# Patient Record
Sex: Female | Born: 1955
Health system: Southern US, Community
[De-identification: ages and names within clinical notes are randomized; demographics above are authoritative.]

## PROBLEM LIST (undated history)

## (undated) DIAGNOSIS — T7840XA Allergy, unspecified, initial encounter: Secondary | ICD-10-CM

## (undated) DIAGNOSIS — F329 Major depressive disorder, single episode, unspecified: Secondary | ICD-10-CM

## (undated) DIAGNOSIS — D72819 Decreased white blood cell count, unspecified: Secondary | ICD-10-CM

## (undated) DIAGNOSIS — K862 Cyst of pancreas: Secondary | ICD-10-CM

## (undated) DIAGNOSIS — J45909 Unspecified asthma, uncomplicated: Secondary | ICD-10-CM

## (undated) DIAGNOSIS — M1990 Unspecified osteoarthritis, unspecified site: Secondary | ICD-10-CM

## (undated) DIAGNOSIS — F419 Anxiety disorder, unspecified: Secondary | ICD-10-CM

## (undated) DIAGNOSIS — S0300XA Dislocation of jaw, unspecified side, initial encounter: Secondary | ICD-10-CM

## (undated) DIAGNOSIS — H269 Unspecified cataract: Secondary | ICD-10-CM

## (undated) DIAGNOSIS — R011 Cardiac murmur, unspecified: Secondary | ICD-10-CM

## (undated) DIAGNOSIS — I951 Orthostatic hypotension: Secondary | ICD-10-CM

## (undated) DIAGNOSIS — A6920 Lyme disease, unspecified: Secondary | ICD-10-CM

## (undated) DIAGNOSIS — Q248 Other specified congenital malformations of heart: Secondary | ICD-10-CM

## (undated) DIAGNOSIS — J309 Allergic rhinitis, unspecified: Secondary | ICD-10-CM

## (undated) DIAGNOSIS — I341 Nonrheumatic mitral (valve) prolapse: Secondary | ICD-10-CM

## (undated) DIAGNOSIS — E039 Hypothyroidism, unspecified: Secondary | ICD-10-CM

## (undated) DIAGNOSIS — M810 Age-related osteoporosis without current pathological fracture: Secondary | ICD-10-CM

## (undated) DIAGNOSIS — F32A Depression, unspecified: Secondary | ICD-10-CM

## (undated) DIAGNOSIS — I700 Atherosclerosis of aorta: Secondary | ICD-10-CM

## (undated) DIAGNOSIS — J939 Pneumothorax, unspecified: Secondary | ICD-10-CM

## (undated) DIAGNOSIS — M722 Plantar fascial fibromatosis: Secondary | ICD-10-CM

## (undated) DIAGNOSIS — D510 Vitamin B12 deficiency anemia due to intrinsic factor deficiency: Secondary | ICD-10-CM

## (undated) DIAGNOSIS — G629 Polyneuropathy, unspecified: Secondary | ICD-10-CM

## (undated) DIAGNOSIS — C4491 Basal cell carcinoma of skin, unspecified: Secondary | ICD-10-CM

## (undated) HISTORY — DX: Age-related osteoporosis without current pathological fracture: M81.0

## (undated) HISTORY — DX: Depression, unspecified: F32.A

## (undated) HISTORY — DX: Other specified congenital malformations of heart: Q24.8

## (undated) HISTORY — DX: Cardiac murmur, unspecified: R01.1

## (undated) HISTORY — DX: Unspecified cataract: H26.9

## (undated) HISTORY — DX: Unspecified asthma, uncomplicated: J45.909

## (undated) HISTORY — PX: WISDOM TOOTH EXTRACTION: SHX21

## (undated) HISTORY — DX: Basal cell carcinoma of skin, unspecified: C44.91

## (undated) HISTORY — DX: Unspecified osteoarthritis, unspecified site: M19.90

## (undated) HISTORY — DX: Anxiety disorder, unspecified: F41.9

## (undated) HISTORY — DX: Pneumothorax, unspecified: J93.9

## (undated) HISTORY — DX: Decreased white blood cell count, unspecified: D72.819

## (undated) HISTORY — DX: Vitamin B12 deficiency anemia due to intrinsic factor deficiency: D51.0

## (undated) HISTORY — DX: Plantar fascial fibromatosis: M72.2

## (undated) HISTORY — DX: Orthostatic hypotension: I95.1

## (undated) HISTORY — DX: Cyst of pancreas: K86.2

## (undated) HISTORY — DX: Allergy, unspecified, initial encounter: T78.40XA

## (undated) HISTORY — DX: Allergic rhinitis, unspecified: J30.9

## (undated) HISTORY — DX: Nonrheumatic mitral (valve) prolapse: I34.1

## (undated) HISTORY — DX: Polyneuropathy, unspecified: G62.9

## (undated) HISTORY — DX: Atherosclerosis of aorta: I70.0

## (undated) HISTORY — DX: Major depressive disorder, single episode, unspecified: F32.9

---
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
## 2008-07-18 ENCOUNTER — Emergency Department (HOSPITAL_COMMUNITY): Admission: EM | Admit: 2008-07-18 | Discharge: 2008-07-18 | Payer: Self-pay | Admitting: Family Medicine

## 2008-07-19 ENCOUNTER — Encounter: Payer: Self-pay | Admitting: Pulmonary Disease

## 2008-07-19 ENCOUNTER — Inpatient Hospital Stay (HOSPITAL_COMMUNITY): Admission: EM | Admit: 2008-07-19 | Discharge: 2008-07-21 | Payer: Self-pay | Admitting: Emergency Medicine

## 2008-07-19 ENCOUNTER — Ambulatory Visit: Payer: Self-pay | Admitting: Pulmonary Disease

## 2008-07-24 ENCOUNTER — Telehealth: Payer: Self-pay | Admitting: Pulmonary Disease

## 2008-07-24 DIAGNOSIS — R930 Abnormal findings on diagnostic imaging of skull and head, not elsewhere classified: Secondary | ICD-10-CM | POA: Insufficient documentation

## 2008-07-24 DIAGNOSIS — J189 Pneumonia, unspecified organism: Secondary | ICD-10-CM | POA: Insufficient documentation

## 2008-07-27 ENCOUNTER — Ambulatory Visit: Payer: Self-pay | Admitting: Pulmonary Disease

## 2008-07-27 ENCOUNTER — Telehealth: Payer: Self-pay | Admitting: Pulmonary Disease

## 2008-07-28 ENCOUNTER — Telehealth: Payer: Self-pay | Admitting: Pulmonary Disease

## 2008-07-28 ENCOUNTER — Telehealth (INDEPENDENT_AMBULATORY_CARE_PROVIDER_SITE_OTHER): Payer: Self-pay | Admitting: *Deleted

## 2008-08-09 ENCOUNTER — Encounter: Payer: Self-pay | Admitting: Pulmonary Disease

## 2008-08-21 ENCOUNTER — Ambulatory Visit: Payer: Self-pay | Admitting: Cardiovascular Disease

## 2008-08-21 ENCOUNTER — Ambulatory Visit: Payer: Self-pay | Admitting: Pulmonary Disease

## 2008-10-18 ENCOUNTER — Ambulatory Visit: Payer: Self-pay | Admitting: Pulmonary Disease

## 2008-10-18 DIAGNOSIS — R0602 Shortness of breath: Secondary | ICD-10-CM | POA: Insufficient documentation

## 2008-10-19 ENCOUNTER — Telehealth (INDEPENDENT_AMBULATORY_CARE_PROVIDER_SITE_OTHER): Payer: Self-pay | Admitting: *Deleted

## 2008-10-19 DIAGNOSIS — M810 Age-related osteoporosis without current pathological fracture: Secondary | ICD-10-CM

## 2008-10-20 ENCOUNTER — Telehealth: Payer: Self-pay | Admitting: Pulmonary Disease

## 2008-10-27 ENCOUNTER — Ambulatory Visit: Payer: Self-pay | Admitting: Pulmonary Disease

## 2008-10-27 ENCOUNTER — Telehealth: Payer: Self-pay | Admitting: Pulmonary Disease

## 2008-11-16 ENCOUNTER — Ambulatory Visit: Payer: Self-pay | Admitting: Pulmonary Disease

## 2008-11-16 DIAGNOSIS — J45909 Unspecified asthma, uncomplicated: Secondary | ICD-10-CM | POA: Insufficient documentation

## 2008-12-19 ENCOUNTER — Emergency Department (HOSPITAL_COMMUNITY): Admission: EM | Admit: 2008-12-19 | Discharge: 2008-12-19 | Payer: Self-pay | Admitting: Emergency Medicine

## 2009-04-09 ENCOUNTER — Telehealth: Payer: Self-pay | Admitting: Pulmonary Disease

## 2009-06-27 ENCOUNTER — Emergency Department (HOSPITAL_COMMUNITY): Admission: EM | Admit: 2009-06-27 | Discharge: 2009-06-28 | Payer: Self-pay | Admitting: Diagnostic Radiology

## 2009-12-24 ENCOUNTER — Ambulatory Visit: Payer: Self-pay | Admitting: Gynecology

## 2010-01-10 ENCOUNTER — Ambulatory Visit: Payer: Self-pay | Admitting: Gynecology

## 2010-02-01 ENCOUNTER — Emergency Department (HOSPITAL_COMMUNITY): Admission: EM | Admit: 2010-02-01 | Discharge: 2010-02-01 | Payer: Self-pay | Admitting: Emergency Medicine

## 2010-03-14 ENCOUNTER — Ambulatory Visit: Payer: Self-pay | Admitting: Gynecology

## 2010-07-26 ENCOUNTER — Emergency Department (HOSPITAL_COMMUNITY)
Admission: EM | Admit: 2010-07-26 | Discharge: 2010-07-26 | Payer: Self-pay | Source: Home / Self Care | Admitting: Family Medicine

## 2010-07-27 ENCOUNTER — Emergency Department (HOSPITAL_COMMUNITY)
Admission: EM | Admit: 2010-07-27 | Discharge: 2010-07-27 | Payer: Self-pay | Source: Home / Self Care | Admitting: Emergency Medicine

## 2010-07-29 ENCOUNTER — Encounter: Payer: Self-pay | Admitting: Pulmonary Disease

## 2010-07-31 ENCOUNTER — Emergency Department (HOSPITAL_COMMUNITY)
Admission: EM | Admit: 2010-07-31 | Discharge: 2010-07-31 | Payer: Self-pay | Source: Home / Self Care | Admitting: Emergency Medicine

## 2010-09-24 NOTE — Assessment & Plan Note (Signed)
Summary: f/u pna///kp   PCP:  Kirby Funk  Chief Complaint:  Pt is here for a f/u appt.  Pt c/o increased sob with exertion and at rest.  Pt denied cough.  Pt denied any new complaints.  .  History of Present Illness: I saw Megan [name] today for follow up of her history of pneumonia and evaluation of her dyspnea.  She started getting trouble with her breathing about two weeks ago.  She gets a tightness in her chest.  She says this feels like she would when exposed to cigarette smoke.  She is not having any wheeze.  She does get a dry cough.  She is not coughing up blood.  She also has been having trouble with her breathing when she exercises.  She says she feels the same way she did prior to her episode of pneumonia last winter.  She denies having fever or sweats.  She has been feeling tired. She has not had any sick exposures.  She does have sinus congestion, but says this is no worse than usual.  She is not having sore throat or hoarseness.  There is no prior history of asthma.   Chest xray today showed bronchitic changes, but no infiltrates or masses  SPIROMETRY RESULTS  Date:10/18/2008 height inches: 70 Gender: Female  Parameter  Measured Predicted %Predicted  FVC      4.33        3.98      109.00  FEV1      3.28        3.13      105  FEV1/FVC      0.76        0.77      99  FEF25-75      2.81        2.95      95  PEF              434      0  Post-Bronchodilator  Parameter Measured Change from Baseline  FVC               FEV1               FEV1/FVC                FEF25-75                PEF               Pulse Oximetry Pulse: 71 O2 Saturation %: 100  Comments: Normal spirometry     Prior Medications Reviewed Using: Patient Recall  Updated Prior Medication List: EFFEXOR XR 37.5 MG XR24H-CAP (VENLAFAXINE HCL) one by mouth once daily PROAIR HFA 108 (90 BASE) MCG/ACT  AERS (ALBUTEROL SULFATE) 1-2 puffs every 4-6 hours as needed  Current Allergies: No known allergies   Past Medical History:    Depression    Osteoporosis    Mitral valve prolapse    Pneumonia     Abnormal chest xray 07/19/08>>resolved on CT chest from 08/21/08   Vital Signs:  Patient Profile:   55 Years Old Female Height:     70 inches Weight:      129 pounds O2 Sat:      100 % O2 treatment:    Room Air Temp:     97.6 degrees F oral Pulse rate:   71 / minute BP sitting:   106 / 62  (left arm) Cuff size:   regular  Vitals Entered By: Arman Filter LPN (October 18, 2008 3:13 PM)             Comments Medications reviewed with patient Arman Filter LPN  October 18, 2008 3:13 PM     Physical Exam  General:     normal appearance, healthy appearing, and thin.   Ears:     TMs intact and clear with normal canals Nose:     narrow nasal angle, no discharge, no tenderness Mouth:     no deformity or lesions Neck:     no masses, thyromegaly, or abnormal cervical nodes Lungs:     decreased breath sounds, no wheezing or rales Heart:     regular rhythm, normal rate, and no murmurs.   Abdomen:     soft, nontender Extremities:     no edema Cervical Nodes:     no significant adenopathy   Pre-Spirometry FVC    Value: 4.33 L/min   Pred: 3.98 L/min     % Pred: 109.00 % FEV1    Value: 3.28 L     Pred: 3.13 L     % Pred: 105 % FEV1/FVC  Value: 0.76 %     Pred: 0.77 %    FEF 25-75  Value: 2.81 L/min   Pred: 2.95 L/min     Impression & Recommendations:  Problem # 1:  DYSPNEA (ICD-786.05) Her symptoms are suggestive of asthma with an exercise induced component.  I will give her a therapeutic trial of Qvar, and she can continue to use proair as needed.  Will repeat spirometry at her next follow up.  Problem # 2:  PNEUMONIA (ICD-486) This does not appear to be an active issue.  Problem # 3:  ABNORMAL CHEST XRAY (ICD-793.1) No acute disease process noted on chest xray from today.  Medications Added to Medication List This Visit: 1)  Proair Hfa 108 (90 Base) Mcg/act Aers  (Albuterol sulfate) .[name].. 1-2 puffs every 4-6 hours as needed 2)  Qvar 80 Mcg/act Aers (Beclomethasone dipropionate) .... Two puffs two times a day  Patient Instructions: 1)  Qvar two puffs two times a day 2)  Proair two puffs up to four times per day as needed  3)  Follow up in 3 to 4 weeks Prescriptions: QVAR 80 MCG/ACT AERS (BECLOMETHASONE DIPROPIONATE) two puffs two times a day  #1 x 3   Entered and Authorized by:   Coralyn Helling MD   Signed by:   Coralyn Helling MD on 10/18/2008   Method used:   [facility]        [facility] [address] [phone]* (retail)       [address]       [place], [place]  [address]       Ph: [phone] or [phone]       Fax: [phone]   RxID:   [id number]

## 2010-09-24 NOTE — Assessment & Plan Note (Signed)
Summary: rov//mbw per libby   PCP:  Kirby Funk  Chief Complaint:  fu visit....Marland Kitchenno problems....Marland Kitchenreviewed meds.  History of Present Illness: I saw [name] in follow up for her abnormal xray and pneumonia.  She has been doing well.  She recently developed sinus congestion.  Otherwise she has not had any trouble with coughing, sputum, chest pain, hemoptysis, or fever.  She will still get short of breath sometimes when she sings at church.  She had a CT chest this morning which showed complete resolution of her right upper lobe infiltrate and atelectasis.  She also had resolution of her lymphadenopathy.  She was seen by Dr. Unk Lightning at [facility], and it seems his opinion that she had pneumonia and no further interventions were recommended.     Current Allergies: No known allergies   Past Medical History:    Depression    Osteoporosis    Mitral valve prolapse    Abnormal chest xray 07/19/08>>resolved on CT chest from 08/21/08  Past Surgical History:    Bronchoscopy, 07/19/08      Vital Signs:  Patient Profile:   55 Years Old Female Height:     70 inches Weight:      129 pounds BMI:     18.58 O2 Sat:      96 % O2 treatment:    Room Air Temp:     97.7 degrees F oral Pulse rate:   65 / minute BP sitting:   90 / 58  (left arm) Cuff size:   regular  Pt. in pain?   no  Vitals Entered By: Clarise Cruz Duncan Dull) (August 21, 2008 10:28 AM)                  Physical Exam  General:     normal appearance, healthy appearing, and thin.   Nose:     no deformity, discharge, inflammation, or lesions Mouth:     no deformity or lesions Neck:     no masses, thyromegaly, or abnormal cervical nodes Lungs:     clear bilaterally to auscultation and percussion Heart:     regular rate and rhythm, S1, S2 without murmurs, rubs, gallops, or clicks Abdomen:     bowel sounds positive; abdomen soft and non-tender without masses, or organomegaly Extremities:     no clubbing,  cyanosis, edema, or deformity noted    Pulmonary Function Test Height (in.): 70 Gender: Female    Impression & Recommendations:  Problem # 1:  ABNORMAL CHEST XRAY (ICD-793.1) This was likely from pneumonia with mucus plugging and has completely resolved on CT chest from today.  No further pulmonary follow up is needed unless she develops worsening symptoms.  I have recommended that she get a pneumococcal vaccination, and she will follow up with her primary care physician for this. Orders: Est. Patient Level II (73220)    Patient Instructions: 1)  Follow up as needed   ]

## 2010-09-24 NOTE — Progress Notes (Signed)
  Phone Note Outgoing Call   Call placed by: Coralyn Helling MD,  July 24, 2008 11:32 AM Call placed to: Patient Summary of Call: I discussed the bronchoscopy results with the patient over the phone.  These are consistant with an inflammatory process, and pneumonia.  There was no evidence of malignancy.  I explained to Megan [name] that this could just be sampling error, and that we still need clinical and radiographic follow up to make sure her pneumonia is resolving, and that she does not indeed have malignancy.  I will schedule her for follow up later this week with my nurse practicioner and have her get a chest xray.  If this shows improvement in her right upper lobe lesion, then I have advised getting a follow up CT chest with contrast toward the middle to end of January.  If her chest xray appears to have gotten worse, then she will need a PET scan to further evaluate her right upper lobe lesion.  I will have my nurse call Megan [name] to arrange for her follow up appointment this week with a chest xray.      Appended Document:  appt scheduled for pt to see tp 12/3 at 9:45

## 2010-09-24 NOTE — Assessment & Plan Note (Signed)
Summary: hosp.f/u w/cxr/apc   Chief Complaint:  Hosp. f/u (home for 6 days)-still has dry cough, diarrhea since home, some sob with exertion, and no wheezing.  History of Present Illness: This is 55 year old female who was admitted on July 19, 2008,  with symptoms of fever and chest pain.  She had been feeling sick for  the week prior to admission.  She had received several courses of  antibiotics as an outpatient. Xray showed  showed significant infiltrate and atelectasis of the right upper lobe.  CT scan of her chest, which confirmed the infiltrate and  atelectasis of the right upper lobe.  --A mass-type lesion obstructing her right upper lobe bronchus.   She has not had any difficulty with weight loss.  She says she exercises regularly and has not been having symptoms of   dyspnea recently.  She did smoke cigarettes for about 1 year while she  was in college but not since then.  She did have significant secondhand  tobacco exposure from her mother who recently passed away from lung  cancer.   Underwent bronchscopy which was negative for malignancy.  Pt was discharged on Avelox. She is feeling better with decreased cough, but still feels weak, low energy, get short of breath with walking. C/o watery stools. over last few days. no abdominal pain, fever or  bloody stools. . Denies chest pain, orthopnea, hemoptysis, fever, n/v/, edema.     Updated Prior Medication List: EFFEXOR XR 37.5 MG XR24H-CAP (VENLAFAXINE HCL) one by mouth once daily AVELOX 400 MG TABS (MOXIFLOXACIN HCL) Take 1 tablet by mouth once a day TESSALON PERLES 100 MG CAPS (BENZONATATE) as needed cough HYDROCODONE-HOMATROPINE 5-1.5 MG/5ML SYRP (HYDROCODONE-HOMATROPINE) as needed  Current Allergies (reviewed today): No known allergies   Past Medical History:    Reviewed history from 07/24/2008 and no changes required:       Depression       Osteoporosis       Mitral valve prolapse       Abnormal chest xray 07/19/08    Family History:    Reviewed history from 07/24/2008 and no changes required:       Mother - lung cancer  Social History:    Reviewed history from 07/24/2008 and no changes required:       Single.  Moved recently from [place] to [place]   Risk Factors: Tobacco use:  never Passive smoke exposure:  yes   Review of Systems      See HPI   Vital Signs:  Patient Profile:   55 Years Old Female Weight:      130.50 pounds O2 Sat:      98 % O2 treatment:    Room Air Temp:     98.1 degrees F oral Pulse rate:   81 / minute BP sitting:   88 / 62  (left arm) Cuff size:   regular  Vitals Entered By: Elray Buba RN (July 27, 2008 9:42 AM)             Is Patient Diabetic? No Comments Medications reviewed with patient  Elray Buba RN  July 27, 2008 9:44 AM      Physical Exam  GEN: A/Ox3; pleasant , NAD, tall thin female HEENT:  [place]/AT, , EACs-clear, TMs-wnl, NOSE-clear, THROAT-clear NECK:  Supple w/ fair ROM; no JVD; normal carotid impulses w/o bruits; no thyromegaly or nodules palpated; no lymphadenopathy. CHEST:  Clear to P & A; w/o, wheezes/ rales/ or rhonchi. HEART:  RRR,  no m/r/g   ABDOMEN:  Soft & nt; nml bowel sounds; no organomegaly or masses detected. EXT: Warm bil,  no calf pain, edema, clubbing, pulses intact        Impression & Recommendations:  Problem # 1:  ABNORMAL CHEST XRAY (ICD-793.1) RUL opacity suspect is representative of PNA. Pt clinically had resp. symptoms, CXR today with much improvement and near total resolution of RUL opacity.  Have recommended to finish avelox for 2 additional day (total of 10 days of therapy ) since she has watery stools If stools do not improve will need evaluation. pt to call our office back or follow up with primary md.  .Will set up for CT chest in 4 weeks for follow up  Please contact office for sooner follow up if symptoms do not improve or worsen  Orders: T-2 View CXR, Same Day (71020.5TC) Est. Patient  Level IV (44010)   Medications Added to Medication List This Visit: 1)  Avelox 400 Mg Tabs (Moxifloxacin hcl) .... Take 1 tablet by mouth once a day 2)  Tessalon Perles 100 Mg Caps (Benzonatate) .... As needed cough 3)  Hydrocodone-homatropine 5-1.5 Mg/74ml Syrp (Hydrocodone-homatropine) .... As needed  Complete Medication List: 1)  Effexor Xr 37.5 Mg Xr24h-cap (Venlafaxine hcl) .... One by mouth once daily 2)  Avelox 400 Mg Tabs (Moxifloxacin hcl) .... Take 1 tablet by mouth once a day 3)  Tessalon Perles 100 Mg Caps (Benzonatate) .... As needed cough 4)  Hydrocodone-homatropine 5-1.5 Mg/48ml Syrp (Hydrocodone-homatropine) .... As needed   Patient Instructions: 1)  finish avelox for 2 more days.  2)  mucinex dm two times a day as needed cough/congestion.  3)  increase fluids.  4)  If diarrhea continues will need follow up  5)  follow up Dr Craige Cotta 4 weeks  6)  Please contact office for sooner follow up if symptoms do not improve or worsen    ]

## 2010-09-24 NOTE — Progress Notes (Signed)
Summary: sob  Phone Note Call from Patient   Caller: Patient Call For: [name] Summary of Call: completed prednisone still having breathing problem Initial call taken by: Rickard Patience,  October 27, 2008 1:30 PM  Follow-up for Phone Call        called and spoke with pt---she stated that she finished the prednisone on 3/4---had a very rough night last night--this am some SOB and she feels like her voice is going--stated that she knows something is going on and requested to be seen today due to her not wanting to end up in the er this weekend.  added pt to see VS at 4 pm today Marijo File CMA  October 27, 2008 1:55 PM

## 2010-09-24 NOTE — Progress Notes (Signed)
Summary: xrays  Phone Note Call from Patient Call back at [facility] Phone [phone]   Caller: Patient Call For: tammy p Reason for Call: Talk to Nurse Summary of Call: you were suppose to speak to [facility] re: pt's xrays.  She was here 12/03 and saw you. Initial call taken by: Eugene Gavia,  July 28, 2008 9:25 AM  Follow-up for Phone Call        Pt returning call to TP regarding CXR.  Michel Bickers [facility]  July 28, 2008 9:44 AM  Additional Follow-up for Phone Call Additional follow up Details #1::        spoke with pt to have ct in 4 weeks with ov with dr Craige Cotta. call with ov with dr Craige Cotta, or work in with another md (per pt request or back with me  Additional Follow-up by: Rubye Oaks NP,  July 28, 2008 11:27 AM    Additional Follow-up for Phone Call Additional follow up Details #2::    CT appt made for 08-21-08 with a following appt with VS.  pt is aware. Follow-up by: Boone Master CNA,  July 31, 2008 4:38 PM

## 2010-09-24 NOTE — Progress Notes (Signed)
Summary: req to speak to dr Craige Cotta re: pt  Phone Note From Other Clinic   Caller: dr Valentina Lucks Call For: Izen Petz Summary of Call: please call dr Valentina Lucks re: pt. dr Valentina Lucks understands that it will probably Mon. 12/7 before Dr Craige Cotta can call him. he says this will be fine. Dr Valentina Lucks [phone] Initial call taken by: Tivis Ringer,  July 28, 2008 3:31 PM  Follow-up for Phone Call        Spoke with Dr. Valentina Lucks about plan for follow up CT scan in 4 weeks. Follow-up by: Coralyn Helling MD,  July 31, 2008 2:24 PM

## 2010-09-24 NOTE — Op Note (Signed)
Summary: [facility]ch photos/[facility]  [facility] photos/[facility]   Imported By: Lester Tryon 10/06/2008 10:21:42  _____________________________________________________________________  External Attachment:    Type:   Image     Comment:   External Document

## 2010-09-24 NOTE — Assessment & Plan Note (Signed)
Summary: 3-4 weeks/apc   Primary Provider/Referring Provider:  Kirby Funk  CC:  Dyspnea follow-up.  Pt stopped the Singulair and Symbicort and Prednisone x1 week ago.  She says her breathing is much better.[name]  History of Present Illness: I saw Megan [name] in follow up for her dyspnea with probable asthma.  She is doing much better after her second course of prednisone, and singulair.  She is no longer using symbicort, and stopped singulair one week ago.  She has only needed to use her rescue inhaler twice over the past week.  She has been using OTC cough medicine to try and prevent a recurrence of her symptoms.  She is back to her usual exercise routine.    Current Medications (verified): 1)  Effexor Xr 37.5 Mg Xr24h-Cap (Venlafaxine Hcl) .... One By Mouth Once Daily 2)  Proair Hfa 108 (90 Base) Mcg/act  Aers (Albuterol Sulfate) .[name].. 1-2 Puffs Every 4-6 Hours As Needed 3)  Dayquil Multi-Symptom 30-325-10 Mg/61ml Liqd (Pseudoephedrine-Apap-Dm) .... Every Other Day  Allergies (verified): No Known Drug Allergies  Past History:  Past Medical History:    Depression    Osteoporosis    Mitral valve prolapse    Pneumonia     Abnormal chest xray 07/19/08>>resolved on CT chest from 08/21/08 (10/18/2008)  Past Surgical History:    Bronchoscopy, 07/19/08     (08/21/2008)  Vital Signs:  Patient profile:   55 year old female Height:      70 inches (177.80 cm) Weight:      129 pounds (58.64 kg) BMI:     18.58 O2 Sat:      100 % Temp:     97.9 degrees F (36.61 degrees C) oral Pulse rate:   62 / minute BP sitting:   110 / 78  (right arm) Cuff size:   regular  Vitals Entered By: Michel Bickers CMA (November 16, 2008 3:08 PM)  O2 Sat at Rest %:  100 O2 Flow:  room air  Physical Exam  General:  normal appearance, healthy appearing, and thin.   Nose:  narrow nasal angle, no discharge, no tenderness Mouth:  no deformity or lesions Neck:  no JVD.   Lungs:  good air entry.  no wheezing or  rales. Heart:  regular rhythm, normal rate, and no murmurs.   Abdomen:  soft, nontender Extremities:  no edema Cervical Nodes:  no significant adenopathy   Pulmonary Function Test Height (in.): 70 Gender: Female  Impression & Recommendations:  Problem # 1:  ASTHMA (ICD-493.90) I suspect her current symptoms are most likely related to asthma, and she has improved with therapy.  She now has mild symptoms, and I have advised her to continue with as needed proair.  Depending on her symptoms status she may need step-up in her regimen later.  I have advised her to limit the use OTC cough medicines, particularly medicines containing decongestants.  Complete Medication List: 1)  Effexor Xr 37.5 Mg Xr24h-cap (Venlafaxine hcl) .... One by mouth once daily 2)  Proair Hfa 108 (90 Base) Mcg/act Aers (Albuterol sulfate) .[name].. 1-2 puffs every 4-6 hours as needed 3)  Dayquil Multi-symptom 30-325-10 Mg/66ml Liqd (Pseudoephedrine-apap-dm) .... Every other day  Other Orders: Est. Patient Level II (98119)  Patient Instructions: 1)  Follow up as needed

## 2010-09-24 NOTE — Assessment & Plan Note (Signed)
Summary: still sob/finished pred 3/4--no better/lw   PCP:  Kirby Funk  Chief Complaint:  Acute visit.  Pt c/o sob and chest tightness and hoarseness.  Pt finshed Prednisone taper on 10-26-08.[name]  History of Present Illness: I saw Megan [name] in follow up for her dyspnea.  She had initial improvement on prednisone, and does get some benefit from her inhalers.  She continues to have problems with her breathing.  She gets a tightness in her chest and a dry cough.  She does have some hoarseness.  She denies nasal congestion, post-nasal drip, or globus.  She is not having any reflux symptoms.  She denies fever.  Chest xray from today does not show any significant change from images in February.     Prior Medications Reviewed Using: Patient Recall  Updated Prior Medication List: EFFEXOR XR 37.5 MG XR24H-CAP (VENLAFAXINE HCL) one by mouth once daily PROAIR HFA 108 (90 BASE) MCG/ACT  AERS (ALBUTEROL SULFATE) 1-2 puffs every 4-6 hours as needed SYMBICORT 160-4.5 MCG/ACT AERO (BUDESONIDE-FORMOTEROL FUMARATE) two puffs two times a day  Current Allergies (reviewed today): No known allergies   Vital Signs:  Patient Profile:   56 Years Old Female Height:     70 inches Weight:      125 pounds O2 Sat:      99 % O2 treatment:    Room Air Temp:     97.8 degrees F oral Pulse rate:   73 / minute BP sitting:   112 / 68  (left arm) Cuff size:   regular  Vitals Entered By: Michel Bickers CMA (October 27, 2008 4:12 PM)                Physical Exam  General:     normal appearance, healthy appearing, and thin.   Nose:     narrow nasal angle, no discharge, no tenderness Mouth:     no deformity or lesions Neck:     no JVD.   Lungs:     decreased breath sounds, no wheezing or rales, good air movement.  patient used proair 1.5 hours prior to exam Heart:     regular rhythm, normal rate, and no murmurs.   Abdomen:     soft, nontender Extremities:     no edema Cervical Nodes:     no significant  adenopathy   Impression & Recommendations:  Problem # 1:  DYSPNEA (ICD-786.05) She had initial improvement with medrol, and does get symptomatic relief with inhaler therapy.  She, however, continues to have symptoms.  I will give her an additional course of steroids, start singulair, and continue her inhalers.  I don't think she needs antibiotics at this time.  If her symptoms persist after this, then she will need to have further imaging studies with either a V/Q scan or CT chest, and full pulmonary function testing.  She may also need to have a methacholine challenge test, and possible cardio-pulmonary exercise testing.    She was also concerned if this could be related to heart disease.  I have advised her that this seems less likely, but she would need to follow up with her primary physician to further assess this.  She may also have a component of anxiety, but I believe she also has a physiologic component to her symptoms.  Medications Added to Medication List This Visit: 1)  Prednisone 10 Mg Tabs (Prednisone) .... 4 pills for two days, then 3 pills for 2 days, then 2 pills for 2 days,  then 1 pill for 2 days, then 1/2 pill for 2 days 2)  Singulair 10 Mg Tabs (Montelukast sodium) .... One by mouth at bedtime  Complete Medication List: 1)  Effexor Xr 37.5 Mg Xr24h-cap (Venlafaxine hcl) .... One by mouth once daily 2)  Proair Hfa 108 (90 Base) Mcg/act Aers (Albuterol sulfate) .[name].. 1-2 puffs every 4-6 hours as needed 3)  Symbicort 160-4.5 Mcg/act Aero (Budesonide-formoterol fumarate) .... Two puffs two times a day 4)  Prednisone 10 Mg Tabs (Prednisone) .... 4 pills for two days, then 3 pills for 2 days, then 2 pills for 2 days, then 1 pill for 2 days, then 1/2 pill for 2 days 5)  Singulair 10 Mg Tabs (Montelukast sodium) .... One by mouth at bedtime  Patient Instructions: 1)  Singulair 10 mg at bedtime  2)  Prednisone 10 mg: take 4 pills for 2 days, then 3 pills for 2 days, then 2 pills for  2 days, then 1 pill for 2 days, then 1/2 pill for 2 days 3)  Continue symbicort two puffs two times a day 4)  Continue proair two puffs as needed 5)  Follow up in two weeks Prescriptions: SINGULAIR 10 MG TABS (MONTELUKAST SODIUM) one by mouth at bedtime  #30 x 1   Entered and Authorized by:   Coralyn Helling MD   Signed by:   Coralyn Helling MD on 10/27/2008   Method used:   [facility] [address] [phone]* (retail)       [address]       [place], [place]  [address]       Ph: [phone] or [phone]       Fax: [phone]   RxID:   [id number] PREDNISONE 10 MG TABS (PREDNISONE) 4 pills for two days, then 3 pills for 2 days, then 2 pills for 2 days, then 1 pill for 2 days, then 1/2 pill for 2 days  #30 x 0   Entered and Authorized by:   Coralyn Helling MD   Signed by:   Coralyn Helling MD on 10/27/2008   Method used:   [facility]        [facility] [address] [phone]* (retail)       [address]       [place], [place]  [address]       Ph: [phone] or [phone]       Fax: [phone]   RxID:   [id number]

## 2010-09-26 NOTE — Letter (Signed)
Summary: [facility]  [facility]   Imported By: Lester Wynot 08/22/2010 09:56:00  _____________________________________________________________________  External Attachment:    Type:   Image     Comment:   External Document

## 2010-11-05 LAB — POCT CARDIAC MARKERS
CKMB, poc: 1.1 ng/mL (ref 1.0–8.0)
CKMB, poc: <1 ng/mL — ABNORMAL LOW (ref 1.0–8.0)
CKMB, poc: <1 ng/mL — ABNORMAL LOW (ref 1.0–8.0)
CKMB, poc: <1 ng/mL — ABNORMAL LOW (ref 1.0–8.0)
Myoglobin, poc: 23.3 ng/mL (ref 12–200)
Myoglobin, poc: 26.2 ng/mL (ref 12–200)
Myoglobin, poc: 36.2 ng/mL (ref 12–200)
Myoglobin, poc: 40.1 ng/mL (ref 12–200)
Troponin i, poc: <0.05 ng/mL (ref 0.00–0.09)
Troponin i, poc: <0.05 ng/mL (ref 0.00–0.09)
Troponin i, poc: <0.05 ng/mL (ref 0.00–0.09)
Troponin i, poc: <0.05 ng/mL (ref 0.00–0.09)

## 2010-11-05 LAB — BASIC METABOLIC PANEL
BUN: 7 mg/dL (ref 6–23)
CO2: 26 mEq/L (ref 19–32)
Calcium: 8.8 mg/dL (ref 8.4–10.5)
Chloride: 100 mEq/L (ref 96–112)
Creatinine, Ser: 0.56 mg/dL (ref 0.4–1.2)
GFR calc Af Amer: >60 mL/min (ref >60)
GFR calc non Af Amer: >60 mL/min (ref >60)
Glucose, Bld: 95 mg/dL (ref 70–99)
Potassium: 3.9 mEq/L (ref 3.5–5.1)
Sodium: 137 mEq/L (ref 135–145)

## 2010-11-05 LAB — CBC
HCT: 41.2 % (ref 36.0–46.0)
Hemoglobin: 14 g/dL (ref 12.0–15.0)
MCH: 31 pg (ref 26.0–34.0)
MCHC: 34 g/dL (ref 30.0–36.0)
MCV: 91.2 fL (ref 78.0–100.0)
Platelets: 178 10*3/uL (ref 150–400)
RBC: 4.52 MIL/uL (ref 3.87–5.11)
RDW: 12.9 % (ref 11.5–15.5)
WBC: 2.8 10*3/uL — ABNORMAL LOW (ref 4.0–10.5)

## 2010-11-05 LAB — DIFFERENTIAL
Basophils Absolute: 0.1 10*3/uL (ref 0.0–0.1)
Basophils Relative: 3 % — ABNORMAL HIGH (ref 0–1)
Eosinophils Absolute: 0 10*3/uL (ref 0.0–0.7)
Eosinophils Relative: 1 % (ref 0–5)
Lymphocytes Relative: 37 % (ref 12–46)
Lymphs Abs: 1 10*3/uL (ref 0.7–4.0)
Monocytes Absolute: 0.4 10*3/uL (ref 0.1–1.0)
Monocytes Relative: 13 % — ABNORMAL HIGH (ref 3–12)
Neutro Abs: 1.3 10*3/uL — ABNORMAL LOW (ref 1.7–7.7)
Neutrophils Relative %: 46 % (ref 43–77)

## 2010-11-27 LAB — POCT CARDIAC MARKERS
CKMB, poc: <1 ng/mL — ABNORMAL LOW (ref 1.0–8.0)
Myoglobin, poc: 47.5 ng/mL (ref 12–200)
Troponin i, poc: <0.05 ng/mL (ref 0.00–0.09)

## 2010-11-27 LAB — CBC
HCT: 39.2 % (ref 36.0–46.0)
Hemoglobin: 13.5 g/dL (ref 12.0–15.0)
MCHC: 34.4 g/dL (ref 30.0–36.0)
MCV: 91.3 fL (ref 78.0–100.0)
Platelets: 207 10*3/uL (ref 150–400)
RBC: 4.3 MIL/uL (ref 3.87–5.11)
RDW: 12.6 % (ref 11.5–15.5)
WBC: 4.5 10*3/uL (ref 4.0–10.5)

## 2010-11-27 LAB — POCT I-STAT, CHEM 8
BUN: 6 mg/dL (ref 6–23)
Calcium, Ion: 1.16 mmol/L (ref 1.12–1.32)
Chloride: 101 mEq/L (ref 96–112)
Creatinine, Ser: 0.8 mg/dL (ref 0.4–1.2)
Glucose, Bld: 123 mg/dL — ABNORMAL HIGH (ref 70–99)
HCT: 42 % (ref 36.0–46.0)
Hemoglobin: 14.3 g/dL (ref 12.0–15.0)
Potassium: 4.3 mEq/L (ref 3.5–5.1)
Sodium: 141 mEq/L (ref 135–145)
TCO2: 30 mmol/L (ref 0–100)

## 2010-11-27 LAB — DIFFERENTIAL
Basophils Absolute: 0 10*3/uL (ref 0.0–0.1)
Basophils Relative: 1 % (ref 0–1)
Eosinophils Absolute: 0 10*3/uL (ref 0.0–0.7)
Eosinophils Relative: 1 % (ref 0–5)
Lymphocytes Relative: 16 % (ref 12–46)
Lymphs Abs: 0.7 10*3/uL (ref 0.7–4.0)
Monocytes Absolute: 0.4 10*3/uL (ref 0.1–1.0)
Monocytes Relative: 9 % (ref 3–12)
Neutro Abs: 3.3 10*3/uL (ref 1.7–7.7)
Neutrophils Relative %: 74 % (ref 43–77)

## 2010-11-27 LAB — D-DIMER, QUANTITATIVE (NOT AT ARMC): D-Dimer, Quant: 0.31 ug/mL-FEU (ref 0.00–0.48)

## 2010-12-04 LAB — DIFFERENTIAL
Basophils Absolute: 0 10*3/uL (ref 0.0–0.1)
Basophils Relative: 1 % (ref 0–1)
Eosinophils Absolute: 0 10*3/uL (ref 0.0–0.7)
Eosinophils Relative: 1 % (ref 0–5)
Lymphocytes Relative: 28 % (ref 12–46)
Lymphs Abs: 1 10*3/uL (ref 0.7–4.0)
Monocytes Absolute: 0.3 10*3/uL (ref 0.1–1.0)
Monocytes Relative: 8 % (ref 3–12)
Neutro Abs: 2.2 10*3/uL (ref 1.7–7.7)
Neutrophils Relative %: 62 % (ref 43–77)

## 2010-12-04 LAB — CBC
HCT: 44.2 % (ref 36.0–46.0)
Hemoglobin: 15.2 g/dL — ABNORMAL HIGH (ref 12.0–15.0)
MCHC: 34.3 g/dL (ref 30.0–36.0)
MCV: 90.2 fL (ref 78.0–100.0)
Platelets: 224 10*3/uL (ref 150–400)
RBC: 4.9 MIL/uL (ref 3.87–5.11)
RDW: 12.8 % (ref 11.5–15.5)
WBC: 3.5 10*3/uL — ABNORMAL LOW (ref 4.0–10.5)

## 2010-12-04 LAB — D-DIMER, QUANTITATIVE: D-Dimer, Quant: 0.26 ug/mL-FEU (ref 0.00–0.48)

## 2011-01-07 NOTE — Op Note (Signed)
NAMECONSTANTINA, Megan [name] NO.:  [id number]   MEDICAL RECORD NO.:  [id number]          PATIENT TYPE:  INP   LOCATION:  4734                         FACILITY:  [facility]   PHYSICIAN:  Coralyn Helling, MD        DATE OF BIRTH:  04/27/1956   DATE OF PROCEDURE:  07/19/2008  DATE OF DISCHARGE:                               OPERATIVE REPORT   PREOPERATIVE DIAGNOSIS:  Right upper lobe mass with postobstructive  pneumonia rule out lung cancer.   POSTOPERATIVE DIAGNOSIS:  Right upper lobe mass with postobstructive  pneumonia rule out lung cancer.   PROCEDURES:  Bronchoscopy with bronchioalveolar lavage brush cytology  and transbronchial biopsy of the right upper lobe.   INDICATION:  Ms. Julson is a 55 year old female who was admitted on  July 19, 2008, with fever and right upper chest pain.  A chest x-ray  and CT scan the chest showed atelectasis, infiltrate, and mass lesion in  the right upper lobe.  The patient is scheduled for bronchoscopy for  further evaluation of this.  The procedure was explained to the patient.  Risks were detailed of bleeding, infection, pneumothorax, and  nondiagnosis.  Consent form was signed.   The patient was brought to the bronchoscopy suite.  She was given a  total of 6 mg of Versed and 100 mcg of fentanyl intravenously for  sedation and analgesia.  She had lidocaine nebulized for anesthesia for  posterior pharynx.  The bronchoscope was entered orally.  The vocal  cords visualized and had normal motion.  The vocal cords did appear to  have slight erythema, but no other obvious lesions.  A 2% Lidocaine was  instilled as needed for topical anesthesia.  The bronchoscope was  entered into the trachea.  The carina was visualized.  It appeared that  the takeoff of the right main bronchus was somewhat elevated in  position.  The bronchoscope was entered in the left main bronchus.  The  left upper lingular and lower lobe orifices were visualized.  There  was  no obvious endobronchial lesions, although the mucosa did appear  somewhat erythematous.  The bronchoscope was then entered into the right  main bronchus.  The right middle and lower lobe orifices were  visualized.  Again, the mucosa appeared somewhat erythematous, but there  was no obvious endobronchial lesions.  Focus was then shifted to the  right upper lobe bronchus, which appeared to be edematous and narrowed  from extrinsic compression.  There were thick yellowish secretions  exuding from the right upper lobe, which were suctioned, but there was  no obvious obstructing mass lesion at this point that can be visualized.  Then, 60 mL of saline was instilled in the right upper lobe with  approximately 10 mL of thick yellowish secretions returned, and brush  cytology was obtained from the right upper lobe.  Then, using  fluoroscopic guidance, transbronchial biopsy was obtained from the right  upper lobe.  There was minimal amount of bleeding, which resolved  spontaneously.  The patient tolerated the procedure well, otherwise and,  there is no immediate  obvious complications.  The patient was returned  to the recovery room in stable condition.  A postprocedure chest x-ray  is pending at this time.  The specimens will be sent for microbiology,  cytology, and surgical pathology.     Coralyn Helling, MD  Electronically Signed    VS/MEDQ  D:  07/19/2008  T:  07/20/2008  Job:  [id number]

## 2011-01-07 NOTE — H&P (Signed)
NAMESKARLET, LYONS               ACCOUNT NO.:  [id number]   MEDICAL RECORD NO.:  [id number]          PATIENT TYPE:  INP   LOCATION:  4734                         FACILITY:  [facility]   PHYSICIAN:  Michiel Cowboy, MDDATE OF BIRTH:  [birth year]   DATE OF ADMISSION:  07/18/2008  DATE OF DISCHARGE:                              HISTORY & PHYSICAL   PRIMARY CARE Beryl Hornberger:  Thora Lance, M.D.   CHIEF COMPLAINT:  Cough, chest pressure, lower extremity muscle spasm,  and difficulty breathing.   Patient is a 55 year old female with a history of depression, who was at  her baseline up until 2 weeks ago when she developed some myalgias and  was not feeling well.  Presented first to her primary care Jeilani Grupe, who  felt that she may have either bile or a bacterial illness. Wrote her for  a Z pack.  Patient went on a trip to [place], continued to feel poorly,  although did not have any fevers, had a persistent cough.  Presented to  urgent care today, who obtained a chest x-ray and felt she most likely  has a pneumonia.  Gave a prescription for Avelox, albuterol inhaler, and  Tessalon Perles.  Sent her home.  Patient took the albuterol and  Tessalon Perles and Avelox around 7 p.m. and then went to lay down.  Around 9 p.m., she developed sudden worsening shortness of breath.  Had  trouble breathing with some gas pain.  Also had bilateral lower  extremity cramps and shaking, which she was not quite sure, never had  this before.  Thought that when she was walking, both of her legs were  sort of dragging behind her, although this has quickly resolved about 10  minutes or so.  She did call EMS.  She felt that this was maybe related  to drug reaction.  Of note, she also took her Effexor about 20 minutes  prior to having this strange reaction.  Patient also reports that for  the past few days, she has been having chest pressure which comes and  goes, but today it has been almost there consistently,  and shortness of  breath that has been progressive.  Otherwise, review of systems, no  fevers, no chills, except the shaking episode today.  No diarrhea, no  constipation.  Review of systems is significant for cough which is  nonproductive.   PAST MEDICAL HISTORY:  Significant for depression, otherwise  unremarkable.   SOCIAL HISTORY:  Patient smoked for only one year in high school.  Does  not drink alcohol.  Has a mother who was a heavy smoker.   FAMILY HISTORY:  Significant for mother recently dying from lung cancer.   MEDICATIONS:  1. Effexor 37.5 mg daily.  2. Recent prescription for Avelox, Tessalon Perles, and albuterol.   ALLERGIES:  No known drug allergies.   PHYSICAL EXAMINATION:  VITAL SIGNS:  Temperature 98.6, blood pressure  128/54, pulse 89, respirations 18, satting 98% on room air.  Heart rate  goes up to 112 when patient sits up.  Patient is a thin female.  Appears to  be in no acute distress.  Head is atraumatic.  Somewhat dry mucous membranes but normal skin  turgor.  LUNGS:  Mild crackles on the right but otherwise unremarkable.  No  wheezing noted.  HEART:  Rapid rate.  No murmur could be appreciated, although a somewhat  sharp S1.  ABDOMEN:  Soft, nontender, nondistended.  LOWER EXTREMITIES:  No clubbing, cyanosis or edema.  Cranial nerves II-  XII intact.  Strength 5/5 in all 4 extremities.   LABS:  White blood cell count 4.8, hemoglobin 13.5.  Sodium 137,  potassium 3.5, creatinine 0.67, bicarb 27.  LFTs within normal limits.  Cardiac enzymes within normal limits.  UA within normal limits.   Chest x-ray showing a possible pneumonia but could not rule out mass.   CT scan showing no PE but right superior hilar region mass occluding  right upper lobe bronchus, with lateral consolidation as well as some  patchy infiltrate on the left side.   ASSESSMENT/PLAN:  1. This is a 55 year old female with no significant smoking history      but second-hand smoke  exposure with likely newly diagnosed lung      mass with postobstructive pneumonia.  Will cover with Zosyn.  Will      need pulmonology consult in the a.m. for bronchoscopy for biopsy.      Will treat cough symptomatically with Guaifenesin.  Patient will      likely need an oncology workup and to be plugged into an oncologist      prior to leaving.  2. History of depression:  Will continue Effexor.  3. Orthostasis  by pulse, likely secondary to dehydration.  Will give      IV fluids and follow.  4. Chest pressure:  I think partly related to the findings on CAT      scan, but given age, we will cycle cardiac enzymes, check fasting      lipid panel.  5. Leg cramps:  Unclear of the etiology of this, either a drug      reaction or possible rigors or electrolyte abnormalities.  Will      check magnesium level, replace potassium.  Given that the patient      had a questionable weakness, will obtain CT scan of the head,      especially given that the patient may have a malignancy to rule out      metastatic lesions.  Again, those are less likely.  6. Prophylaxis:  Protonix and heparin subcu.   Kirby Funk or his partner to assume care in the a.m.      Michiel Cowboy, MD  Electronically Signed     AVD/MEDQ  D:  07/19/2008  T:  07/19/2008  Job:  [id number]

## 2011-01-07 NOTE — Consult Note (Signed)
NAMELINNET, BOTTARI               ACCOUNT NO.:  [id number]   MEDICAL RECORD NO.:  [id number]          PATIENT TYPE:  INP   LOCATION:                               FACILITY:  [facility]   PHYSICIAN:  Coralyn Helling, MD        DATE OF BIRTH:  1956-01-26   DATE OF CONSULTATION:  07/19/2008  DATE OF DISCHARGE:  07/21/2008                                 CONSULTATION   REASON FOR CONSULTATION:  Abnormal chest x-ray.   Ms. Macdonnell is a 55 year old female who was admitted on July 19, 2008,  with symptoms of fever and chest pain.  She had been feeling sick for  the week prior to admission.  She had received several courses of  antibiotics as an outpatient.  She has not had any chest x-ray done  recently.  She developed chills and rigors and as a result, came to the  emergency room.  In the emergency room, she had a chest x-ray which  showed significant infiltrate and atelectasis of the right upper lobe.  She then had CT scan of her chest, which confirmed the infiltrate and  atelectasis of the right upper lobe.  In addition, there appeared to be  a mass-type lesion obstructing her right upper lobe bronchus.  Ms. Wiegert  otherwise denies any symptoms of coughing, sputum production, or  hemoptysis.  She denies headaches, dizziness, or blurred vision.  She  has been having a sore throat for the last of couple of days but has no  difficulty swallowing.  She has not had any difficulty with weight loss.  She says she exercises regularly and has not been having symptoms of  dyspnea recently.  She did smoke cigarettes for about 1 year while she  was in college but not since then.  She did have significant secondhand  tobacco exposure from her mother who recently passed away from lung  cancer.  There is no significant occupational exposures.  She denies any  significant recent travel history.  There is no prior history of  pneumonia, tuberculosis, or asthma.  She denies any significant  occupational or animal  exposure.   Past medical history is significant for:  1. Depression.  2. Osteoporosis.  3. A history of mitral valve prolapse.   SOCIAL HISTORY:  She is single.  She recently moved to the [place]  area from [place].  She lives alone.   Family history is significant for, her mother had lung cancer.   Medications are Effexor 37.5 mg daily.   She has no known drug allergies.   PHYSICAL EXAMINATION:  GENERAL:  She was seen in her hospital room.  She  is awake, alert, does not appear to be in acute distress.  VITAL SIGNS:  Temperature is 100.6, oxygen saturation 96 on room air,  blood pressure is 123/93, heart rate is 93, respiratory rate is 16.  HEENT:  Pupils reactive.  Extraocular muscles intact.  There is no sinus  tenderness.  No oral lesions.  NECK:  There is no lymphadenopathy, no thyromegaly.  HEART:  S1 and S2.  No  murmur.  CHEST:  She has coarse breath sounds, more prominent in the right upper  lobe, but there was no wheezing or rales.  ABDOMEN:  Thin, soft, nontender, positive bowel sounds.  EXTREMITIES:  There was no edema, cyanosis, or clubbing.  NEUROLOGIC:  She is alert and oriented x3 with normal strength and no  focal deficits.   Hemoglobin 13.5, hematocrit 39.5 WBC is 4.8, platelet count is 312.  Sodium is 137, potassium is 3.5, chloride is 105, CO2 is 27, BUN 7,  creatinine 0.6, glucose 152.  PT is 14.1, INR is 1.1.  AST is 35, ALT is  18, ALP is 83, bilirubin 0.4, albumin 3.7.  Cardiac enzymes negative.  Calcium is 8.9, magnesium is 2.1.  Chest x-ray and CT scan as stated  above.   IMPRESSION:  Abnormal chest radiographic findings as stated above.  The  concern that I have is that she has an obstructive lesion causing a  postobstructive pneumonia with the main concern being that this may  represent underlying malignancy.  I will continue her on her current  course of antibiotics per her primary team.  She will also need a  bronchoscopy with airway  inspection and possible tissue sampling to  further evaluate this.  I have discussed the procedure with the patient.  I have explained the risks involved including the possibility of  bleeding, pneumothorax, infection, and nondiagnosis.  The patient  understands these risks, and I gave her the opportunity to answering the  questions.  I have scheduled her for this at 1:00 p.m. on July 19, 2008.  Further recommendations will be made after review of her  bronchoscopy results.      Coralyn Helling, MD  Electronically Signed     VS/MEDQ  D:  07/19/2008  T:  07/19/2008  Job:  [id number]

## 2011-04-23 ENCOUNTER — Encounter: Payer: Self-pay | Admitting: Sports Medicine

## 2011-04-23 ENCOUNTER — Ambulatory Visit (INDEPENDENT_AMBULATORY_CARE_PROVIDER_SITE_OTHER): Payer: BC Managed Care – PPO | Admitting: Sports Medicine

## 2011-04-23 VITALS — BP 95/65 | HR 69 | Ht 69.5 in | Wt 125.0 lb

## 2011-04-23 DIAGNOSIS — M79609 Pain in unspecified limb: Secondary | ICD-10-CM

## 2011-04-23 DIAGNOSIS — M79673 Pain in unspecified foot: Secondary | ICD-10-CM | POA: Insufficient documentation

## 2011-04-23 DIAGNOSIS — M2170 Unequal limb length (acquired), unspecified site: Secondary | ICD-10-CM

## 2011-04-23 DIAGNOSIS — R269 Unspecified abnormalities of gait and mobility: Secondary | ICD-10-CM | POA: Insufficient documentation

## 2011-04-23 NOTE — Assessment & Plan Note (Addendum)
The patient is having breakdown of the Lisfranc joint bilaterally. This creates a prominence at the first TMT joint. We created sports insoles to correct her leg length discrepancy with a felt lift as well as support her arch with small scaphoid pads. She will also work on hip abductor strengthening. We will see her back in one month to reassess symptoms.

## 2011-04-23 NOTE — Assessment & Plan Note (Addendum)
Sports insoles with correction of level a leg length discrepancy and arch support as above.  May be a good candidate for permanent orthotics with her leg length difference and activity.

## 2011-04-23 NOTE — Patient Instructions (Signed)
Great to meet you today. We talked about strengthening her hip abductors. We talked about breakdown of one of the joint in your foot  We made you some sports insoles with special padding to correct a leg length discrepancy. Come back to see [place] in one month.  Ihor Austin. Benjamin Stain, M.D. [facility] [address], [place] [address] [phone]

## 2011-04-23 NOTE — Assessment & Plan Note (Signed)
Trial with adding felt 3/4 length lift

## 2011-04-23 NOTE — Progress Notes (Signed)
  Subjective:    Patient ID: Megan [name], female    DOB: 1956-03-02, 55 y.o.   MRN: [id number]  HPI The patient is a pleasant 55 year old female with bilateral foot pain and swelling over the dorsum for the past 2 years she notes this particularly after impact type activities such as running and jumping, and the pain lasts for approximately 2-3 weeks when it occurs. She's began to self treat with ice and ibuprofen which works very well. She notes no change in her exercise regimen, or intensity before the pain started, as well as no changes in her medications or diet. She also hasn't had any recent changes in her footwear before the pain started. That she localizes the pain predominantly to over the dorsal first metatarsophalangeal joint, as well as along the path of the extensor hallucis longus tendon on both sides, however worse on the left than the right. She has no pain over the plantar aspect of the foot. She is not currently swollen.  Past medical history: None Past surgical history: Tonsillectomy and adenoidectomy as a child. Social history: No alcohol, tobacco, or drugs. 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 Family history: Positive for arthritis. Allergies: No known drug allergies. Medications: Effexor.   Review of Systems    no fevers, chills, night sweats, weight loss. Objective:   Physical Exam General: Well-developed, well-nourished Caucasian female in no acute distress. Neuro: Well-developed alert and oriented x3. Skin: Warm and dry. Musculoskeletal: Hips: Excellent internal and external rotation of 60 bilaterally, but with weak hip abductors bilaterally. Bilateral Ankles: No visible erythema or swelling. Range of motion is full in all directions. Strength is 5/5 in all directions. Stable lateral and medial ligaments; squeeze test and kleiger test unremarkable; Talar dome nontender; No pain at base of 5th MT; No tenderness over cuboid; No tenderness over N spot or navicular  prominence No tenderness on posterior aspects of lateral and medial malleolus No sign of peroneal tendon subluxations or tenderness to palpation Negative tarsal tunnel tinel's She does have reproduction of her pain with resisted dorsiflexion of the great toe. She has a cavus foot but with no breakdown of the transverse arch. She has subluxation of the fifth toe bilaterally, she also has prominence of the Lisfranc joint. Gait was analyzed and is unremarkable.     Assessment & Plan:

## 2011-05-22 ENCOUNTER — Ambulatory Visit (INDEPENDENT_AMBULATORY_CARE_PROVIDER_SITE_OTHER): Payer: BC Managed Care – PPO | Admitting: Sports Medicine

## 2011-05-22 ENCOUNTER — Encounter: Payer: Self-pay | Admitting: Sports Medicine

## 2011-05-22 VITALS — BP 104/70 | HR 67

## 2011-05-22 DIAGNOSIS — M79673 Pain in unspecified foot: Secondary | ICD-10-CM

## 2011-05-22 DIAGNOSIS — M79609 Pain in unspecified limb: Secondary | ICD-10-CM

## 2011-05-22 NOTE — Progress Notes (Signed)
  Subjective:    Patient ID: Megan [name], female    DOB: June 21, 1956, 55 y.o.   MRN: [id number]  HPI  Pt presents to clinic for f/u of bilat foot pain which she states has worsened since last visit.   She has been wearing sports insoles with correction made at last visit for exercise. Now having pain constantly on lt at MT heads and lateral foot.  Also notices slight swelling over MT heads dorsal aspect.  Has pain on rt MT heads after significant walking.  Most comfortable shoes for her are [employer]'s with wide soft toe box and moderate arch support.  Uses Yoga toe separators which make feet feel better after wearing for about 30 minutes.         Review of Systems     Objective:   Physical Exam  Subluxation of 5th toes bilat Neutral long arch bilat Mild swelling over the dorsum of the forefoot over the metatarsals 2, 3 and 4 on the left On the right foot there is some very minimal swelling over the second MTP joint on the dorsum  Pain over 5th MTP on lt with squeeze test Lt great toe - 30 deg extension, 20 deg flexion Rt great toe- 45 deg extension, 20 deg flexion  Ankle ligaments stable bilat Straight leg raise neg bilat Hip abduction excellent bilat        Assessment & Plan:

## 2011-05-22 NOTE — Assessment & Plan Note (Addendum)
[facility] ultrasound On the left foot there is some cortical thickening of the distal metatarsal shafts 2 and 3 but not seen on 4 There is some very slight hypoechoic change distally in the same area No increased Doppler flow No other abnormalities are noted on this foot  The right foot was unremarkable  Today we removed the scaphoid pants He tried metatarsal pads and metatarsal cookies After positioning but she was able to tolerate metatarsal pads and did feel some support under the forefoot  We want to give these a one-month trial I cautioned her against positions in yoga  that would cause too much extension of the toes  I also suggested buying a shoe with more support in the forefoot  View of these changes a one-month trial If she does not improve we could consider trying custom orthotics

## 2011-05-22 NOTE — Patient Instructions (Signed)
Please try correction and insoles for the next month  Do not force stretches in yoga that are painful to toes  Please follow up in 1 month

## 2011-05-27 LAB — URINALYSIS, ROUTINE W REFLEX MICROSCOPIC
Bilirubin Urine: NEGATIVE
Glucose, UA: NEGATIVE mg/dL
Hgb urine dipstick: NEGATIVE
Ketones, ur: NEGATIVE mg/dL
Nitrite: NEGATIVE
Protein, ur: NEGATIVE mg/dL
Specific Gravity, Urine: 1.008 (ref 1.005–1.030)
Urobilinogen, UA: 0.2 mg/dL (ref 0.0–1.0)
pH: 7 (ref 5.0–8.0)

## 2011-05-27 LAB — COMPREHENSIVE METABOLIC PANEL
ALT: 18 U/L (ref 0–35)
AST: 35 U/L (ref 0–37)
Albumin: 3.7 g/dL (ref 3.5–5.2)
Alkaline Phosphatase: 83 U/L (ref 39–117)
BUN: 7 mg/dL (ref 6–23)
CO2: 27 mEq/L (ref 19–32)
Calcium: 8.9 mg/dL (ref 8.4–10.5)
Chloride: 105 mEq/L (ref 96–112)
Creatinine, Ser: 0.67 mg/dL (ref 0.4–1.2)
GFR calc Af Amer: >60 mL/min (ref >60)
GFR calc non Af Amer: >60 mL/min (ref >60)
Glucose, Bld: 152 mg/dL — ABNORMAL HIGH (ref 70–99)
Potassium: 3.5 mEq/L (ref 3.5–5.1)
Sodium: 137 mEq/L (ref 135–145)
Total Bilirubin: 0.4 mg/dL (ref 0.3–1.2)
Total Protein: 6.4 g/dL (ref 6.0–8.3)

## 2011-05-27 LAB — AFB CULTURE WITH SMEAR (NOT AT ARMC): Acid Fast Smear: NONE SEEN

## 2011-05-27 LAB — DIFFERENTIAL
Basophils Absolute: 0 10*3/uL (ref 0.0–0.1)
Basophils Relative: 1 % (ref 0–1)
Eosinophils Absolute: 0.1 10*3/uL (ref 0.0–0.7)
Eosinophils Relative: 1 % (ref 0–5)
Lymphocytes Relative: 14 % (ref 12–46)
Lymphs Abs: 0.7 10*3/uL (ref 0.7–4.0)
Monocytes Absolute: 0.4 10*3/uL (ref 0.1–1.0)
Monocytes Relative: 9 % (ref 3–12)
Neutro Abs: 3.6 10*3/uL (ref 1.7–7.7)
Neutrophils Relative %: 74 % (ref 43–77)

## 2011-05-27 LAB — LIPID PANEL
Cholesterol: 160 mg/dL (ref 0–200)
HDL: 37 mg/dL — ABNORMAL LOW (ref >39)
LDL Cholesterol: 111 mg/dL — ABNORMAL HIGH (ref 0–99)
Total CHOL/HDL Ratio: 4.3 RATIO
Triglycerides: 61 mg/dL (ref <150)
VLDL: 12 mg/dL (ref 0–40)

## 2011-05-27 LAB — CBC
HCT: 39.5 % (ref 36.0–46.0)
Hemoglobin: 13.5 g/dL (ref 12.0–15.0)
MCHC: 34.2 g/dL (ref 30.0–36.0)
MCV: 90.3 fL (ref 78.0–100.0)
Platelets: 312 10*3/uL (ref 150–400)
RBC: 4.38 MIL/uL (ref 3.87–5.11)
RDW: 12.1 % (ref 11.5–15.5)
WBC: 4.8 10*3/uL (ref 4.0–10.5)

## 2011-05-27 LAB — CARDIAC PANEL(CRET KIN+CKTOT+MB+TROPI)
CK, MB: 1.3 ng/mL (ref 0.3–4.0)
CK, MB: 1.5 ng/mL (ref 0.3–4.0)
Relative Index: INVALID (ref 0.0–2.5)
Relative Index: INVALID (ref 0.0–2.5)
Total CK: 50 U/L (ref 7–177)
Total CK: 52 U/L (ref 7–177)
Troponin I: <0.01 ng/mL (ref 0.00–0.06)
Troponin I: <0.01 ng/mL (ref 0.00–0.06)

## 2011-05-27 LAB — FUNGUS CULTURE W SMEAR: Fungal Smear: NONE SEEN

## 2011-05-27 LAB — POCT CARDIAC MARKERS
CKMB, poc: <1 ng/mL — ABNORMAL LOW (ref 1.0–8.0)
Myoglobin, poc: 51.2 ng/mL (ref 12–200)
Troponin i, poc: <0.05 ng/mL (ref 0.00–0.09)

## 2011-05-27 LAB — URINE CULTURE
Colony Count: NO GROWTH
Culture: NO GROWTH

## 2011-05-27 LAB — MAGNESIUM: Magnesium: 2.1 mg/dL (ref 1.5–2.5)

## 2011-05-27 LAB — PROTIME-INR
INR: 1.1 (ref 0.00–1.49)
Prothrombin Time: 14.1 seconds (ref 11.6–15.2)

## 2011-05-27 LAB — CK TOTAL AND CKMB (NOT AT ARMC)
CK, MB: 1.4 ng/mL (ref 0.3–4.0)
Relative Index: INVALID (ref 0.0–2.5)
Total CK: 60 U/L (ref 7–177)

## 2011-05-27 LAB — CULTURE, RESPIRATORY

## 2011-05-27 LAB — TROPONIN I: Troponin I: <0.01 ng/mL (ref 0.00–0.06)

## 2011-05-27 LAB — CULTURE, RESPIRATORY W GRAM STAIN

## 2011-05-27 LAB — LACTATE DEHYDROGENASE: LDH: 210 U/L (ref 94–250)

## 2011-05-27 LAB — TSH: TSH: 6.76 u[IU]/mL — ABNORMAL HIGH (ref 0.350–4.500)

## 2011-07-01 ENCOUNTER — Ambulatory Visit: Payer: BC Managed Care – PPO | Admitting: Sports Medicine

## 2011-11-12 ENCOUNTER — Ambulatory Visit: Payer: BC Managed Care – PPO | Admitting: Endocrinology

## 2011-11-26 ENCOUNTER — Other Ambulatory Visit: Payer: Self-pay | Admitting: Dermatology

## 2011-12-18 ENCOUNTER — Encounter: Payer: Self-pay | Admitting: *Deleted

## 2011-12-30 ENCOUNTER — Encounter (INDEPENDENT_AMBULATORY_CARE_PROVIDER_SITE_OTHER): Payer: BC Managed Care – PPO

## 2011-12-30 ENCOUNTER — Other Ambulatory Visit: Payer: Self-pay | Admitting: *Deleted

## 2011-12-30 DIAGNOSIS — M79609 Pain in unspecified limb: Secondary | ICD-10-CM

## 2011-12-30 DIAGNOSIS — M7989 Other specified soft tissue disorders: Secondary | ICD-10-CM

## 2012-01-24 HISTORY — PX: MOUTH SURGERY: SHX715

## 2012-02-27 HISTORY — PX: ROOT CANAL: SHX2363

## 2012-03-26 ENCOUNTER — Encounter (HOSPITAL_COMMUNITY): Payer: Self-pay | Admitting: Emergency Medicine

## 2012-03-26 ENCOUNTER — Emergency Department (HOSPITAL_COMMUNITY)
Admission: EM | Admit: 2012-03-26 | Discharge: 2012-03-26 | Disposition: A | Discharge status: Home / Self Care | Payer: BC Managed Care – PPO | Attending: Emergency Medicine | Admitting: Emergency Medicine

## 2012-03-26 DIAGNOSIS — E079 Disorder of thyroid, unspecified: Secondary | ICD-10-CM | POA: Insufficient documentation

## 2012-03-26 DIAGNOSIS — G609 Hereditary and idiopathic neuropathy, unspecified: Secondary | ICD-10-CM | POA: Insufficient documentation

## 2012-03-26 DIAGNOSIS — I059 Rheumatic mitral valve disease, unspecified: Secondary | ICD-10-CM | POA: Insufficient documentation

## 2012-03-26 DIAGNOSIS — G629 Polyneuropathy, unspecified: Secondary | ICD-10-CM

## 2012-03-26 LAB — POCT I-STAT, CHEM 8
BUN: 16 mg/dL (ref 6–23)
Calcium, Ion: 1.25 mmol/L — ABNORMAL HIGH (ref 1.12–1.23)
Chloride: 104 mEq/L (ref 96–112)
Creatinine, Ser: 0.8 mg/dL (ref 0.50–1.10)
Glucose, Bld: 86 mg/dL (ref 70–99)
HCT: 43 % (ref 36.0–46.0)
Hemoglobin: 14.6 g/dL (ref 12.0–15.0)
Potassium: 4 mEq/L (ref 3.5–5.1)
Sodium: 140 mEq/L (ref 135–145)
TCO2: 25 mmol/L (ref 0–100)

## 2012-03-26 MED ORDER — HYDROCODONE-ACETAMINOPHEN 5-325 MG PO TABS: 1.0000 | ORAL_TABLET | ORAL | Status: AC | PRN

## 2012-03-26 NOTE — ED Notes (Signed)
Pt has decreased circulation in feet and pain has been increasing.  Pt states her feet are turning blue.  Sees a neurologist for diagnosis but cannot tolerate the pain and cant be seen by MD until Oct.  Pt alert oriented X4.

## 2012-03-26 NOTE — ED Notes (Signed)
Reports nerve pain in feet-getting worse over past year, has seen neurologist--reports unsure of what could be causing pain; reports getting worse; feels like cramping pain in feet/toes; also reports was on antibiotics for dental infection, and is having discoloration to tongue; pt crying at triage- reports has been weaning off effexor d/t neurologist reports might help with leg pain

## 2012-03-26 NOTE — ED Provider Notes (Signed)
History     CSN: [id number]  Arrival date & time 03/26/12  1901   First MD Initiated Contact with Patient 03/26/12 2007      Chief Complaint  Patient presents with  . Pain   HPI  History provided by the patient. Patient is a 56 year old female with history of thyroid disease who presents with complaints of continued "peripheral neuropathy" pains. Patient states that she has been diagnosed with peripheral neuropathy with an unknown cause for the past year or more. Patient has had extensive workup for these symptoms by her PCP and neurology specialist. Patient states she has had workup including lupus, rheumatoid arthritis, Raynaud's, and demyelinating disorders without any definite diagnosis. Patient states that symptoms have been worsening and she has some tingling and pain traveling up her shins and occasionally in her hands and fingers at this point. Patient also complains of some increased dark discoloration to the bottoms of her feet. Patient states her the alternative black at times. Patient denies any other associated symptoms. Denies any fevers, night sweats, weight loss, abdominal pains, chest pain, shortness of breath or heart palpitations.    Past Medical History  Diagnosis Date  . MVP (mitral valve prolapse)   . Heart murmur   . Depression   . Chest pain   . Orthostatic hypotension   . Thyroid disease     Past Surgical History  Procedure Date  . Tonsillectomy     Family History  Problem Relation Age of Onset  . Hyperlipidemia Father   . Heart failure Father   . Prostate cancer Father   . Kidney failure Father   . Angina Mother   . Hypertension Mother   . Lung cancer Mother     History  Substance Use Topics  . Smoking status: Never Smoker   . Smokeless tobacco: Never Used  . Alcohol Use: No    OB History    Grav Para Term Preterm Abortions TAB SAB Ect Mult Living                  Review of Systems  Respiratory: Negative for shortness of breath.     Cardiovascular: Negative for chest pain.  Gastrointestinal: Negative for abdominal pain.  Musculoskeletal: Positive for myalgias and arthralgias.       Bilateral feet pain  Skin: Positive for rash.       Rash to the soles of bilateral feet  Neurological: Positive for numbness. Negative for weakness and headaches.    Allergies  Review of patient's allergies indicates no known allergies.  Home Medications   Current Outpatient Rx  Name Route Sig Dispense Refill  . LEVOTHYROXINE SODIUM 25 MCG PO TABS Oral Take 12.5 mcg by mouth daily.    . VENLAFAXINE HCL 37.5 MG PO TABS Oral Take 37.5 mg by mouth at bedtime.      BP 113/62  Pulse 72  Temp 98.6 F (37 C) (Oral)  Resp 18  SpO2 100%  Physical Exam  Nursing note and vitals reviewed. Constitutional: She is oriented to person, place, and time. She appears well-developed and well-nourished. No distress.  HENT:  Head: Normocephalic.  Eyes: Conjunctivae and EOM are normal. Pupils are equal, round, and reactive to light.  Cardiovascular: Normal rate and regular rhythm.   Pulmonary/Chest: Effort normal and breath sounds normal.  Abdominal: Soft. There is no tenderness.  Musculoskeletal: Normal range of motion. She exhibits no edema and no tenderness.       Normal dorsal pedal pulses bilaterally.  Strong anterior tibialis pulses bilaterally. Cap refill less than 2 seconds in toes. Extremities are cold. See skin exam  Neurological: She is alert and oriented to person, place, and time. She has normal strength. No cranial nerve deficit or sensory deficit. Gait normal.  Skin: Skin is warm and dry. No rash noted.       Bilateral feet are very cold to the touch. Multiple small varicosities to bilateral feet greatest over the medial aspects. There is a dark purpura-like discoloration to the soles of the feet. This is blanchable and becomes more pink in color with friction and warmth.  Psychiatric: She has a normal mood and affect. Her behavior is  normal.    ED Course  Procedures     Results for orders placed during the hospital encounter of 03/26/12  POCT I-STAT, CHEM 8      Component Value Range   Sodium 140  135 - 145 mEq/L   Potassium 4.0  3.5 - 5.1 mEq/L   Chloride 104  96 - 112 mEq/L   BUN 16  6 - 23 mg/dL   Creatinine, Ser 1.61  0.50 - 1.10 mg/dL   Glucose, Bld 86  70 - 99 mg/dL   Calcium, Ion 0.96 (*) 1.12 - 1.23 mmol/L   TCO2 25  0 - 100 mmol/L   Hemoglobin 14.6  12.0 - 15.0 g/dL   HCT 04.5  40.9 - 81.1 %       1. Peripheral neuropathy       MDM  Patient seen and evaluated. Patient has had extensive workup for same complaints by PCP and neurologist. This has included rheumatological testing for lupus, arthritis, Raynaud, and other neurologic sources. At this point patient has no definite diagnosis. Patient has normal dorsal pedal pulses and cap refill to toes.   Patient was discussed with attending physician. Patient has had extensive workup for these symptoms. There does not appear to be any emergent cause of symptoms at this time I discussed with patient possibilities for poor circulation in feet given cold extremities and improvement of discoloration with warmth of foot. I advised patient to continue to followup with PCP and specialist.     Angus Seller, PA 03/27/12 2159

## 2012-03-27 NOTE — ED Provider Notes (Signed)
Medical screening examination/treatment/procedure(s) were performed by non-physician practitioner and as supervising physician I was immediately available for consultation/collaboration.  Ethelda Chick, MD 03/27/12 2216

## 2012-05-13 ENCOUNTER — Other Ambulatory Visit: Payer: Self-pay | Admitting: Oncology

## 2012-05-13 DIAGNOSIS — D72819 Decreased white blood cell count, unspecified: Secondary | ICD-10-CM

## 2012-05-14 ENCOUNTER — Telehealth: Payer: Self-pay | Admitting: Oncology

## 2012-05-14 NOTE — Telephone Encounter (Signed)
S/W pt in re NP appt 10/16 @ 9:30 labs 9/30 w/ Dr. Cyndie Chime.  Referring Dr. Kirby Funk Dx- Leukopenia, [place] NP packet  Mailed.

## 2012-05-14 NOTE — Telephone Encounter (Signed)
C/D on 9/20 for appt 10/16

## 2012-05-18 ENCOUNTER — Telehealth: Payer: Self-pay | Admitting: Oncology

## 2012-05-18 NOTE — Telephone Encounter (Signed)
C/D on 9/24 for appt 10/16

## 2012-05-20 ENCOUNTER — Emergency Department (HOSPITAL_COMMUNITY): Payer: BC Managed Care – PPO

## 2012-05-20 ENCOUNTER — Emergency Department (HOSPITAL_COMMUNITY)
Admission: EM | Admit: 2012-05-20 | Discharge: 2012-05-21 | Disposition: A | Discharge status: Home / Self Care | Payer: BC Managed Care – PPO | Attending: Emergency Medicine | Admitting: Emergency Medicine

## 2012-05-20 ENCOUNTER — Encounter (HOSPITAL_COMMUNITY): Payer: Self-pay | Admitting: Emergency Medicine

## 2012-05-20 DIAGNOSIS — I059 Rheumatic mitral valve disease, unspecified: Secondary | ICD-10-CM | POA: Insufficient documentation

## 2012-05-20 DIAGNOSIS — F419 Anxiety disorder, unspecified: Secondary | ICD-10-CM

## 2012-05-20 DIAGNOSIS — F411 Generalized anxiety disorder: Secondary | ICD-10-CM | POA: Insufficient documentation

## 2012-05-20 DIAGNOSIS — R0602 Shortness of breath: Secondary | ICD-10-CM

## 2012-05-20 DIAGNOSIS — E079 Disorder of thyroid, unspecified: Secondary | ICD-10-CM | POA: Insufficient documentation

## 2012-05-20 NOTE — ED Notes (Signed)
Pt reports got woken up by feeling SOB; reports hx of anxiety but feels different; pts lungs clear, NAD in at triage

## 2012-05-21 LAB — COMPREHENSIVE METABOLIC PANEL
ALT: 13 U/L (ref 0–35)
AST: 30 U/L (ref 0–37)
Albumin: 3.9 g/dL (ref 3.5–5.2)
Alkaline Phosphatase: 74 U/L (ref 39–117)
BUN: 14 mg/dL (ref 6–23)
CO2: 30 mEq/L (ref 19–32)
Calcium: 9.8 mg/dL (ref 8.4–10.5)
Chloride: 104 mEq/L (ref 96–112)
Creatinine, Ser: 0.59 mg/dL (ref 0.50–1.10)
GFR calc Af Amer: >90 mL/min (ref >90)
GFR calc non Af Amer: >90 mL/min (ref >90)
Glucose, Bld: 109 mg/dL — ABNORMAL HIGH (ref 70–99)
Potassium: 4 mEq/L (ref 3.5–5.1)
Sodium: 142 mEq/L (ref 135–145)
Total Bilirubin: 0.2 mg/dL — ABNORMAL LOW (ref 0.3–1.2)
Total Protein: 6.5 g/dL (ref 6.0–8.3)

## 2012-05-21 LAB — URINALYSIS, ROUTINE W REFLEX MICROSCOPIC
Bilirubin Urine: NEGATIVE
Glucose, UA: NEGATIVE mg/dL
Hgb urine dipstick: NEGATIVE
Ketones, ur: NEGATIVE mg/dL
Nitrite: NEGATIVE
Protein, ur: NEGATIVE mg/dL
Specific Gravity, Urine: 1.018 (ref 1.005–1.030)
Urobilinogen, UA: 0.2 mg/dL (ref 0.0–1.0)
pH: 6.5 (ref 5.0–8.0)

## 2012-05-21 LAB — CBC WITH DIFFERENTIAL/PLATELET
Basophils Absolute: 0 10*3/uL (ref 0.0–0.1)
Basophils Relative: 1 % (ref 0–1)
Eosinophils Absolute: 0.1 10*3/uL (ref 0.0–0.7)
Eosinophils Relative: 2 % (ref 0–5)
HCT: 37.6 % (ref 36.0–46.0)
Hemoglobin: 12.8 g/dL (ref 12.0–15.0)
Lymphocytes Relative: 31 % (ref 12–46)
Lymphs Abs: 1.1 10*3/uL (ref 0.7–4.0)
MCH: 29.5 pg (ref 26.0–34.0)
MCHC: 34 g/dL (ref 30.0–36.0)
MCV: 86.6 fL (ref 78.0–100.0)
Monocytes Absolute: 0.3 10*3/uL (ref 0.1–1.0)
Monocytes Relative: 8 % (ref 3–12)
Neutro Abs: 2 10*3/uL (ref 1.7–7.7)
Neutrophils Relative %: 58 % (ref 43–77)
Platelets: 234 10*3/uL (ref 150–400)
RBC: 4.34 MIL/uL (ref 3.87–5.11)
RDW: 12.7 % (ref 11.5–15.5)
WBC: 3.5 10*3/uL — ABNORMAL LOW (ref 4.0–10.5)

## 2012-05-21 LAB — URINE MICROSCOPIC-ADD ON

## 2012-05-21 LAB — POCT I-STAT TROPONIN I: Troponin i, poc: 0 ng/mL (ref 0.00–0.08)

## 2012-05-21 LAB — TROPONIN I: Troponin I: <0.3 ng/mL (ref <0.30)

## 2012-05-21 MED ORDER — ASPIRIN 81 MG PO CHEW
324.0000 mg | CHEWABLE_TABLET | Freq: Once | ORAL | Status: AC
  Administered 2012-05-21: 324 mg via ORAL
  Filled 2012-05-21: qty 4

## 2012-05-21 MED ORDER — LORAZEPAM 2 MG/ML IJ SOLN
1.0000 mg | Freq: Once | INTRAMUSCULAR | Status: AC
  Administered 2012-05-21: 1 mg via INTRAVENOUS
  Filled 2012-05-21: qty 1

## 2012-05-21 NOTE — ED Provider Notes (Signed)
History     CSN: [id number]  Arrival date & time 05/20/12  2244   None     Chief Complaint  Patient presents with  . Shortness of Breath    (Consider location/radiation/quality/duration/timing/severity/associated sxs/prior treatment) HPI History provided by pt.   Pt woke at 10:30pm w/ SOB.  Worsened when she got up from bed and seemed to get a little bit better when she went out into fresh air.  Severe for approx 20 minutes but lasted a total of 1 hour.  Associated w/ mild, diffuse chest heaviness that has actually increased in intensity and is currently still present, as well as lightheadedness and shakiness.  She also feels as though there is a bowling ball moving around lower abd and that she might need to have diarrhea soon.  Denies N/V and urinary sx.  Has had a cough for the past 2 days.  Denies fever.  No RF for ACS.  Most recent stress echo ~46yrs ago.  Exercises on a daily basis and never experiences SOB.  No RF for PE and denies LE edema or unilateral pain.  Remote h/o anxiety and she takes klonopin before public speaking.  No relief w/ 1/4 of a klonopin this morning.     Past Medical History  Diagnosis Date  . MVP (mitral valve prolapse)   . Heart murmur   . Depression   . Chest pain   . Orthostatic hypotension   . Thyroid disease   . Neuropathy     Past Surgical History  Procedure Date  . Tonsillectomy     Family History  Problem Relation Age of Onset  . Hyperlipidemia Father   . Heart failure Father   . Prostate cancer Father   . Kidney failure Father   . Angina Mother   . Hypertension Mother   . Lung cancer Mother     History  Substance Use Topics  . Smoking status: Never Smoker   . Smokeless tobacco: Never Used  . Alcohol Use: No    OB History    Grav Para Term Preterm Abortions TAB SAB Ect Mult Living                  Review of Systems  All other systems reviewed and are negative.    Allergies  Review of patient's allergies indicates no  known allergies.  Home Medications   Current Outpatient Rx  Name Route Sig Dispense Refill  . CLONAZEPAM 1 MG PO TABS Oral Take 1 mg by mouth daily as needed. Anxiety    . LEVOTHYROXINE SODIUM 25 MCG PO TABS Oral Take 12.5 mcg by mouth daily.    . ADULT MULTIVITAMIN W/MINERALS CH Oral Take 1 tablet by mouth daily.    . VENLAFAXINE HCL 37.5 MG PO TABS Oral Take 37.5 mg by mouth at bedtime.      BP 115/71  Pulse 84  Temp 97.9 F (36.6 C) (Oral)  Resp 16  SpO2 100%  Physical Exam  Nursing note and vitals reviewed. Constitutional: She is oriented to person, place, and time. She appears well-developed and well-nourished. No distress.  HENT:  Head: Normocephalic and atraumatic.  Eyes:       Normal appearance  Neck: Normal range of motion.  Cardiovascular: Normal rate, regular rhythm and intact distal pulses.   Pulmonary/Chest: Effort normal and breath sounds normal. No respiratory distress.       No pleuritic pain reported.  Mild, diffuse chest ttp  Abdominal: Soft. Bowel sounds are  normal. She exhibits no distension. There is no tenderness. There is no guarding.  Musculoskeletal: Normal range of motion.       No peripheral edema or calf tenderness  Neurological: She is alert and oriented to person, place, and time.  Skin: Skin is warm and dry. No rash noted.  Psychiatric: She has a normal mood and affect. Her behavior is normal.    ED Course  Procedures (including critical care time)   Date: 05/21/2012  Rate:   Rhythm: normal sinus rhythm  QRS Axis: normal  Intervals: normal  ST/T Wave abnormalities: normal  Conduction Disutrbances:none  Narrative Interpretation:   Old EKG Reviewed: unchanged    Labs Reviewed  CBC WITH DIFFERENTIAL  COMPREHENSIVE METABOLIC PANEL  URINALYSIS, ROUTINE W REFLEX MICROSCOPIC   Dg Chest 2 View  05/20/2012  *RADIOLOGY REPORT*  Clinical Data: Short of breath.  Pneumonia.  History of collapsed lung.  CHEST - 2 VIEW  Comparison:  12/03/2011.  07/27/2012.  Findings: No pneumothorax.  Nonspecific diffuse interstitial prominence is a chronic finding in this patient.  No airspace disease.  No effusion.  Cardiopericardial silhouette within normal limits.  Trachea midline.  Compared to prior, no interval change.  IMPRESSION: No acute cardiopulmonary disease.  No pneumonia or pneumothorax.   Original Report Authenticated By: Andreas Newport, M.D.      1. Shortness of breath   2. Anxiety       MDM  Healthy 56yo F presents w/ c/o SOB that woke her from sleep early this am.  Associated w/ chest heaviness as well as lightheadedness, shakiness and sensation of bowling ball and diarrhea cramps in lower abd.  Low risk ACS and EKG non-ischemic.  Pt exercises on a daily basis w/out SOB.  No RF for or exam findings consistent w/ PE.  CXR neg.  Suspect that sx are secondary to anxiety, but because she continues to have chest heaviness, troponin has been ordered and pt to receive aspirin and SL ntg.  Will reassess shortly.  12:39 AM   SL ntg was not ordered (my error).  Pt continues to have "tightness" in her chest but denies SOB currently.  Labs sig for neg troponin and this troponin was drawn approx 2.5 hours after onset of pain.  Discussed w/ Dr. Verl Bangs and he recommends giving her ativan to see if it relieves her symptoms.  He is in agreement that sx are most consistent w/ anxiety attack.  A second troponin is pending.   1:59 AM   Second troponin neg.  Chest discomfort still present but continues to improve.  Because she is TIMI 0 and negative evaluation this evening, will d/c home w/ referral to cardiology for outpatient stress.  Strict return precautions discussed.  4:44 AM        Otilio Miu, PA 05/21/12 [phone]

## 2012-05-21 NOTE — ED Provider Notes (Signed)
Medical screening examination/treatment/procedure(s) were performed by non-physician practitioner and as supervising physician I was immediately available for consultation/collaboration.  Ruben Pyka Lytle Michaels, MD 05/21/12 [phone]

## 2012-05-21 NOTE — ED Notes (Signed)
i-Stat Troponin Results:  cTnl 0.00 ng/mL 

## 2012-05-21 NOTE — ED Notes (Signed)
The patient is AOx4 and comfortable with her discharge instructions. 

## 2012-05-24 ENCOUNTER — Other Ambulatory Visit (HOSPITAL_BASED_OUTPATIENT_CLINIC_OR_DEPARTMENT_OTHER): Payer: BC Managed Care – PPO | Admitting: Lab

## 2012-05-24 DIAGNOSIS — D72819 Decreased white blood cell count, unspecified: Secondary | ICD-10-CM

## 2012-05-24 LAB — CBC & DIFF AND RETIC
BASO%: 1.4 % (ref 0.0–2.0)
Basophils Absolute: 0 10*3/uL (ref 0.0–0.1)
EOS%: 2.4 % (ref 0.0–7.0)
Eosinophils Absolute: 0.1 10*3/uL (ref 0.0–0.5)
HCT: 39 % (ref 34.8–46.6)
HGB: 13 g/dL (ref 11.6–15.9)
Immature Retic Fract: 5.4 % (ref 1.60–10.00)
LYMPH%: 30.6 % (ref 14.0–49.7)
MCH: 29 pg (ref 25.1–34.0)
MCHC: 33.3 g/dL (ref 31.5–36.0)
MCV: 87.1 fL (ref 79.5–101.0)
MONO#: 0.3 10*3/uL (ref 0.1–0.9)
MONO%: 8.5 % (ref 0.0–14.0)
NEUT#: 1.7 10*3/uL (ref 1.5–6.5)
NEUT%: 57.1 % (ref 38.4–76.8)
Platelets: 209 10*3/uL (ref 145–400)
RBC: 4.48 10*6/uL (ref 3.70–5.45)
RDW: 12.8 % (ref 11.2–14.5)
Retic %: 0.83 % (ref 0.70–2.10)
Retic Ct Abs: 37.18 10*3/uL (ref 33.70–90.70)
WBC: 2.9 10*3/uL — ABNORMAL LOW (ref 3.9–10.3)
lymph#: 0.9 10*3/uL (ref 0.9–3.3)

## 2012-05-24 LAB — COMPREHENSIVE METABOLIC PANEL (CC13)
ALT: 13 U/L (ref 0–55)
AST: 29 U/L (ref 5–34)
Albumin: 4 g/dL (ref 3.5–5.0)
Alkaline Phosphatase: 65 U/L (ref 40–150)
BUN: 13 mg/dL (ref 7.0–26.0)
CO2: 27 mEq/L (ref 22–29)
Calcium: 9.5 mg/dL (ref 8.4–10.4)
Chloride: 106 mEq/L (ref 98–107)
Creatinine: 0.7 mg/dL (ref 0.6–1.1)
Glucose: 72 mg/dl (ref 70–99)
Potassium: 4.1 mEq/L (ref 3.5–5.1)
Sodium: 140 mEq/L (ref 136–145)
Total Bilirubin: 0.5 mg/dL (ref 0.20–1.20)
Total Protein: 6.6 g/dL (ref 6.4–8.3)

## 2012-05-24 LAB — CHCC SMEAR

## 2012-05-24 LAB — MORPHOLOGY: PLT EST: ADEQUATE

## 2012-05-24 LAB — LACTATE DEHYDROGENASE (CC13): LDH: 195 U/L (ref 125–220)

## 2012-05-26 LAB — HEPATITIS PANEL, ACUTE
HCV Ab: NEGATIVE
Hep A IgM: NEGATIVE
Hep B C IgM: NEGATIVE
Hepatitis B Surface Ag: NEGATIVE

## 2012-05-26 LAB — CMV IGM: CMV IgM: 0.14 (ref <0.90)

## 2012-05-26 LAB — IMMUNOFIXATION ELECTROPHORESIS
IgA: 125 mg/dL (ref 69–380)
IgG (Immunoglobin G), Serum: 662 mg/dL — ABNORMAL LOW (ref 690–1700)
IgM, Serum: 104 mg/dL (ref 52–322)
Total Protein, Serum Electrophoresis: 6.8 g/dL (ref 6.0–8.3)

## 2012-05-26 LAB — CYTOMEGALOVIRUS ANTIBODY, IGG: Cytomegalovirus Ab-IgG: 0.44 (ref <0.90)

## 2012-05-26 LAB — EPSTEIN-BARR VIRUS VCA, IGG: EBV VCA IgG: <10 U/mL (ref <18.0)

## 2012-05-26 LAB — HIV ANTIBODY (ROUTINE TESTING W REFLEX): HIV: NONREACTIVE

## 2012-05-26 LAB — EPSTEIN-BARR VIRUS VCA, IGM: EBV VCA IgM: <10 U/mL (ref <36.0)

## 2012-05-26 LAB — SEDIMENTATION RATE: Sed Rate: 1 mm/hr (ref 0–22)

## 2012-06-09 ENCOUNTER — Encounter: Payer: Self-pay | Admitting: Oncology

## 2012-06-09 ENCOUNTER — Telehealth: Payer: Self-pay | Admitting: Oncology

## 2012-06-09 ENCOUNTER — Ambulatory Visit (HOSPITAL_BASED_OUTPATIENT_CLINIC_OR_DEPARTMENT_OTHER): Payer: BC Managed Care – PPO | Admitting: Oncology

## 2012-06-09 ENCOUNTER — Ambulatory Visit: Payer: BC Managed Care – PPO

## 2012-06-09 VITALS — BP 94/58 | HR 68 | Temp 97.5°F | Resp 20 | Ht 70.0 in | Wt 125.4 lb

## 2012-06-09 DIAGNOSIS — D72819 Decreased white blood cell count, unspecified: Secondary | ICD-10-CM

## 2012-06-09 DIAGNOSIS — G629 Polyneuropathy, unspecified: Secondary | ICD-10-CM

## 2012-06-09 DIAGNOSIS — G609 Hereditary and idiopathic neuropathy, unspecified: Secondary | ICD-10-CM

## 2012-06-09 HISTORY — DX: Polyneuropathy, unspecified: G62.9

## 2012-06-09 NOTE — Progress Notes (Signed)
New Patient Hematology-Oncology Evaluation   Megan [name] [id number] 1955-09-12 56 y.o. 06/09/2012  CC: Dr. Kirby Funk; Dr. Porfirio Mylar Dohmeier; Dr Jerl Mina   Reason for referral: Idiopathic leukopenia and peripheral neuropathy   HPI:  56 year old Caucasian woman who has been in overall excellent health without any major medical or surgical illness until about one year ago when she started to develop pain in her feet. This was initially felt to be plantar fasciitis. Orthopedic evaluation was unremarkable. She then began to develop severe cramps in her calves. Over time she developed paresthesias in her hands as well. She has had up to 5 episodes a week where the bottoms of her feet become extremely painful and turn black. She was evaluated in the emergency department for one of these episodes. She does not report any classic Raynaud's phenomenon in her fingers. She has no signs or symptoms of a collagen vascular disorder. She has no constitutional symptoms. She has now had an extensive evaluation by local neurologists and neurologists at [facility] as well as a rheumatologic evaluation all of which have not yielded a diagnosis. She has had a normal serum and urine protein electrophoresis. I checked immunoglobulins and immunofixation electrophoresis prior to this visit and other than a mild decrease in total IgG they are normal and there is no monoclonal protein on immunofixation electrophoresis. cryoglobulin screen was negative. She has a weak positive ANA 1:40 in a speckled pattern, negative rheumatoid factor, normal C3 and C4 complements, negative elevation of Sjogren antibodies, negative ANCA, normal MRI of the cervical and lumbar spine except for degenerative changes, normal MRI of the brain except for some nonspecific white matter changes in the subcortical regions and a incidental 1.8 x 1 cm pineal region cyst. She has had conflicting results on nerve conduction studies. Study done in  [place] on 10/15/2011 consistent with a mild distal sensory neuropathy. Study done at [facility] 04/08/2012 reported as normal. Additional neurologic antibody panel was unrevealing. A paraneoplastic autoantibody evaluation was negative. Urine heavy metal screen was negative. Serum copper level was normal at 116 (72-166 mcg percent. Treponema pallidum test was negative.  She is found to have a low white count with a normal differential. No anemia. No thrombocytopenia. The [facility] neurologist suggested a hematology consultation.  She was first told back in 1990 by one of her physicians that she had a low white count. She was told again in the early 2000's that her white count was as low as 2400. I have laboratory data back as far as 2009. This indicates fluctuating leukopenia with values ranging between 4,800 and 2,800. CBC done here last week with hemoglobin 13, hematocrit 39, MCV 87, white count 2900, 57% neutrophils, 31% lymphocytes, 8% monocytes, 2% eosinophils, platelet count 209,000. ESR 1 mm.  She has no history of exposure to any known virus that might cause chronic leukopenia. I checked hepatitis A, B., C., HIV, EBV, and CMV and all are negative. There are no typical changes for chronic viral infection on review of her blood film today. Neutrophils and lymphocytes appear mature. There are rare reactive lymphocytes which are benign appearing.  She has no hobbies or exposures that have brought her in contact with organic solvents, lead, mercury. She recently saw her on alternative medicine practitioner in [place] who suggested that she have all of her fillings removed from her mouth and go on parenteral chelation therapy.   PMH: Past Medical History  Diagnosis Date  . MVP (mitral valve prolapse)   . Heart murmur   .  Depression   . Chest pain   . Orthostatic hypotension   . Thyroid disease   . Neuropathy   . Peripheral neuropathy 06/09/2012  4 years ago she had an episode of  pneumothorax/pneumonia requiring hospitalization. She has mild hypothyroidism. She runs low blood pressure. No history of ulcers. No asthma, no history of tuberculosis, no history of hepatitis, yellow jaundice, malaria, kidney stones, seizure, stroke, blood clots.  Past Surgical History  Procedure Date  . Tonsillectomy   Recent oral surgery/root canal then extraction  Allergies: No Known Allergies  Medications: Klonopin 1 mg when necessary anxiety; Synthroid 25 mcg one half tablet daily; multivitamins with iron one daily; Effexor 37.5 mg at bedtime   Social History: She is divorced. 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 She works primarily from home but has climbs all over the country. She really enjoys her job and enjoys helping other people find jobs that they like. Never pregnant. No children.  reports that she has never smoked. She has never used smokeless tobacco. She reports that she does not drink alcohol or use illicit drugs.  Family History: She is an only child Family History  Problem Relation Age of Onset  . Hyperlipidemia Father   . Heart failure Father   . Prostate cancer Father   . Kidney failure Father   . Angina Mother   . Hypertension Mother   . Lung cancer Mother     Review of Systems: Constitutional symptoms: No anorexia, weight loss, night sweats, fever HEENT: No sore throat Respiratory: No cough, dyspnea, wheezing Cardiovascular:  No chest pain, palpitations Gastrointestinal ROS: No dysphagia, no change in bowel habits, no hematochezia or melena, no abdominal pain Genito-Urinary ROS: She is postmenopausal. No vaginal bleeding. No hematuria. Hematological and Lymphatic: No swollen glands. No easy bruising Musculoskeletal: See above Neurologic: See above; and recently her balance has been off do to paresthesias in her feet. Dermatologic: No rash. No ecchymoses. Remaining ROS negative.  Physical Exam: Blood  pressure 94/58, pulse 68, temperature 97.5 F (36.4 C), temperature source Oral, resp. rate 20, height 5\' 10"  (1.778 m), weight 125 lb 6.4 oz (56.881 kg). Wt Readings from Last 3 Encounters:  06/09/12 125 lb 6.4 oz (56.881 kg)  04/23/11 125 lb (56.7 kg)  11/16/08 129 lb (58.514 kg)    General appearance: Thin, pleasant, Caucasian woman Head: Normal Neck: Full range of motion Lymph nodes: No adenopathy Breasts: Not examined Lungs: Clear to auscultation resonant to percussion Heart: Regular rhythm, no murmur, no gallop, no click Abdominal: Soft, nontender, no mass, no organomegaly GU: Not examined Extremities: No edema, no calf tenderness. There is cyanosis of the soles of her feet worse in the dependent position when her legs were hanging over the exam bench for a number of minutes. No cyanotic changes of her hands. Vascular: She has strong, symmetric, 2+ dorsalis pedis pulses. Absent but symmetric posterior tibial pulses. 2+ symmetric popliteal pulses. 2+ symmetric radial pulses. 1+ symmetric ulnar pulses. Carotids 2+. No bruits. Neurologic: Mental status intact, cranial nerves intact, PERRLA, optic disc sharp, vessels normal, motor strength 5 over 5 all extremities, reflexes 1+ symmetric, sensation moderate decrease to vibration sensation over the fingers and severe decrease in vibration sensation over the dorsum of the feet by tuning fork exam Skin: No rash. There is an ecchymosis medial right calf where she fell down recently    Lab Results: Lab Results  Component Value Date   WBC  2.9* 05/24/2012   HGB 13.0 05/24/2012   HCT 39.0 05/24/2012   MCV 87.1 05/24/2012   PLT 209 05/24/2012     Chemistry      Component Value Date/Time   NA 140 05/24/2012 1053   NA 142 05/21/2012 0104   K 4.1 05/24/2012 1053   K 4.0 05/21/2012 0104   CL 106 05/24/2012 1053   CL 104 05/21/2012 0104   CO2 27 05/24/2012 1053   CO2 30 05/21/2012 0104   BUN 13.0 05/24/2012 1053   BUN 14 05/21/2012 0104    CREATININE 0.7 05/24/2012 1053   CREATININE 0.59 05/21/2012 0104      Component Value Date/Time   CALCIUM 9.5 05/24/2012 1053   CALCIUM 9.8 05/21/2012 0104   ALKPHOS 65 05/24/2012 1053   ALKPHOS 74 05/21/2012 0104   AST 29 05/24/2012 1053   AST 30 05/21/2012 0104   ALT 13 05/24/2012 1053   ALT 13 05/21/2012 0104   BILITOT 0.50 05/24/2012 1053   BILITOT 0.2* 05/21/2012 0104       Review of peripheral blood film: Decreased total white cell. Mature neutrophils and lymphocytes. Normal platelets. Normal red cells. Rare reactive but benign-appearing lymphocyte   Radiological Studies: See discussion above     Impression: #1. Chronic, mild, leukopenia without anemia or thrombocytopenia and with a normal white count differential. Absence of immature cells on peripheral blood film.  I think this is totally unrelated to her idiopathic peripheral neuropathy. I don't see any value in putting her through a bone marrow biopsy since I don't think the information that we would  obtain will change her management.  #2. Idiopathic neuropathy and atypical cyanotic changes of the soles of her feet. She has excellent pulses in the large vessels of her lower extremities including her feet so I don't think that this is a large vessel vasculitis problem. It could be a small vessel vasculitis. She has no other systemic signs of a vasculitis and no signs of systemic inflammation (ESR 1 mm!). Although cryoglobulinemia would  fit the clinical picture, initial cryoglobulin screen is negative, there are no monoclonal proteins detectable in her serum or urine. She is not anemic. She has no constitutional symptoms. I'm going to repeat the cryoglobulin screen and also obtain a 24-hour urine for total protein and immunofixation electrophoresis. I expect that these are going to be unrevealing.  I will consider a trial of a calcium channel blocker and we discussed this. She is concerned because she always runs a low blood  pressure. I see that she was on a trial of low-dose verapamil in the past prescribed by [facility] neurology. I offered as an alternative, a trial of Aggrenox(aspirin 25 mg with Persantine 200 mg) twice daily. If we are dealing with a small vessel problem, this may give her some symptomatic relief. She is willing to give it a try.      [name], MD 06/09/2012, 1:04 PM

## 2012-06-09 NOTE — Progress Notes (Signed)
Checked in new pt with no financial concerns. °

## 2012-06-09 NOTE — Telephone Encounter (Signed)
gv and printed appt schedule for pt for Jan 2014. °

## 2012-06-09 NOTE — Patient Instructions (Signed)
Begin trial of aggrenox 1 capsule twice daily Follow up visit with Dr Reece Agar on Jan 21,2014

## 2012-06-14 ENCOUNTER — Other Ambulatory Visit: Payer: Self-pay | Admitting: *Deleted

## 2012-06-14 MED ORDER — ASPIRIN-DIPYRIDAMOLE ER 25-200 MG PO CP12: 1.0000 | ORAL_CAPSULE | Freq: Two times a day (BID) | ORAL | Status: DC

## 2012-06-15 ENCOUNTER — Telehealth: Payer: Self-pay | Admitting: *Deleted

## 2012-06-15 LAB — UIFE/LIGHT CHAINS/TP QN, 24-HR UR
Albumin, U: DETECTED
Free Kappa Lt Chains,Ur: 0.92 mg/dL (ref 0.14–2.42)
Free Kappa/Lambda Ratio: 10.22 ratio (ref 2.04–10.37)
Free Lambda Excretion/Day: 2.43 mg/d
Free Lambda Lt Chains,Ur: 0.09 mg/dL (ref 0.02–0.67)
Free Lt Chn Excr Rate: 24.84 mg/d
Time: 24 hours
Total Protein, Urine-Ur/day: 41 mg/d (ref 10–140)
Total Protein, Urine: 1.5 mg/dL
Volume, Urine: 2700 mL

## 2012-06-15 LAB — CRYOGLOBULIN

## 2012-06-15 NOTE — Telephone Encounter (Signed)
Notified pt of negative cryoglobulin test & no protein or abnormal antibodies in the urine.  Results will be routed to Dr. Kirby Funk per Dr.Granfortuna's request.

## 2012-06-15 NOTE — Telephone Encounter (Signed)
Message copied by Sabino Snipes on Tue Jun 15, 2012  5:14 PM ------      Message from: [name]      Created: Tue Jun 15, 2012 11:57 AM       Call patient: repeat cryoglobulin test negative; no protein or abnormal antibodies in the urine.      Please forward reports to referring MD Dr Kirby Funk

## 2012-06-29 ENCOUNTER — Telehealth: Payer: Self-pay | Admitting: *Deleted

## 2012-06-29 NOTE — Telephone Encounter (Signed)
Pt called req a letter from Dr. Cyndie Chime to Dr. Tally Due with [name], a long term care insurance company explaining that work=up done for low WBC & determined that it is not a problem & that she  probably just has has a natural low wbc.  She has been denied long term care insurance.  She would like the letter to accompany her records to go to this MD at [phone] & she would also like a copy of the letter mailed to her.  The letter also needs to be identifed with her # [id number].  Note to Dr. Cyndie Chime.

## 2012-07-07 ENCOUNTER — Telehealth: Payer: Self-pay | Admitting: *Deleted

## 2012-07-07 ENCOUNTER — Encounter: Payer: Self-pay | Admitting: Oncology

## 2012-07-07 ENCOUNTER — Other Ambulatory Visit: Payer: Self-pay | Admitting: Oncology

## 2012-07-07 NOTE — Telephone Encounter (Signed)
Patient called to see if letter had been done for long term insurance.  She has not received a copy as yet.  Will check with Dr. Cyndie Chime and let her know.  She requested that if on call back, we got her voice mail, that it was fine to leave any message.

## 2012-07-08 ENCOUNTER — Telehealth: Payer: Self-pay | Admitting: Oncology

## 2012-07-08 NOTE — Telephone Encounter (Signed)
-   faxed pt medical records to [facility] -mailed Dr. Reece Agar letter to pt - Dr Reece Agar letter to pt  to be scanned

## 2012-07-12 ENCOUNTER — Other Ambulatory Visit: Payer: Self-pay | Admitting: Internal Medicine

## 2012-07-12 DIAGNOSIS — E049 Nontoxic goiter, unspecified: Secondary | ICD-10-CM

## 2012-07-14 ENCOUNTER — Ambulatory Visit
Admission: RE | Admit: 2012-07-14 | Discharge: 2012-07-14 | Disposition: A | Discharge status: Home / Self Care | Payer: BC Managed Care – PPO | Source: Ambulatory Visit | Attending: Internal Medicine | Admitting: Internal Medicine

## 2012-07-14 DIAGNOSIS — E049 Nontoxic goiter, unspecified: Secondary | ICD-10-CM

## 2012-08-10 ENCOUNTER — Telehealth: Payer: Self-pay | Admitting: *Deleted

## 2012-08-10 NOTE — Telephone Encounter (Signed)
Patient called re: long term insurance.  She was denied insurance d/t Dr. Patsy Lager letter mentioned vasculitis.  She mailed letter on 07-29-12 to Dr. Cyndie Chime asking him to do a second letter, with a change in the "vasculitis" wording.  Patient is wondering if Dr. Cyndie Chime has gotten her letter and redone the wording?  Please call her at [phone].

## 2012-08-11 ENCOUNTER — Other Ambulatory Visit: Payer: Self-pay | Admitting: Oncology

## 2012-08-11 ENCOUNTER — Encounter: Payer: Self-pay | Admitting: Oncology

## 2012-08-12 ENCOUNTER — Telehealth: Payer: Self-pay | Admitting: *Deleted

## 2012-08-12 NOTE — Telephone Encounter (Signed)
Letter & OV note from 06/09/12 faxed  to [facility] [employer] @  [phone] per pt request & copy of letter mailed  to pt per her request.

## 2012-09-14 ENCOUNTER — Telehealth: Payer: Self-pay | Admitting: Oncology

## 2012-09-14 ENCOUNTER — Ambulatory Visit: Payer: BC Managed Care – PPO | Admitting: Oncology

## 2012-09-14 ENCOUNTER — Ambulatory Visit (HOSPITAL_BASED_OUTPATIENT_CLINIC_OR_DEPARTMENT_OTHER): Payer: BC Managed Care – PPO | Admitting: Oncology

## 2012-09-14 ENCOUNTER — Ambulatory Visit: Payer: BC Managed Care – PPO | Admitting: Lab

## 2012-09-14 ENCOUNTER — Other Ambulatory Visit (HOSPITAL_BASED_OUTPATIENT_CLINIC_OR_DEPARTMENT_OTHER): Payer: BC Managed Care – PPO | Admitting: Lab

## 2012-09-14 VITALS — BP 108/62 | HR 62 | Temp 96.9°F | Resp 20 | Ht 70.0 in | Wt 128.6 lb

## 2012-09-14 DIAGNOSIS — G629 Polyneuropathy, unspecified: Secondary | ICD-10-CM

## 2012-09-14 DIAGNOSIS — D72819 Decreased white blood cell count, unspecified: Secondary | ICD-10-CM

## 2012-09-14 DIAGNOSIS — G609 Hereditary and idiopathic neuropathy, unspecified: Secondary | ICD-10-CM

## 2012-09-14 LAB — CBC WITH DIFFERENTIAL/PLATELET
BASO%: 1.3 % (ref 0.0–2.0)
Basophils Absolute: 0 10*3/uL (ref 0.0–0.1)
EOS%: 1.4 % (ref 0.0–7.0)
Eosinophils Absolute: 0 10*3/uL (ref 0.0–0.5)
HCT: 40.5 % (ref 34.8–46.6)
HGB: 14.1 g/dL (ref 11.6–15.9)
LYMPH%: 31.7 % (ref 14.0–49.7)
MCH: 30.6 pg (ref 25.1–34.0)
MCHC: 34.8 g/dL (ref 31.5–36.0)
MCV: 87.9 fL (ref 79.5–101.0)
MONO#: 0.3 10*3/uL (ref 0.1–0.9)
MONO%: 10.2 % (ref 0.0–14.0)
NEUT#: 1.7 10*3/uL (ref 1.5–6.5)
NEUT%: 55.4 % (ref 38.4–76.8)
Platelets: 211 10*3/uL (ref 145–400)
RBC: 4.61 10*6/uL (ref 3.70–5.45)
RDW: 12.9 % (ref 11.2–14.5)
WBC: 3.1 10*3/uL — ABNORMAL LOW (ref 3.9–10.3)
lymph#: 1 10*3/uL (ref 0.9–3.3)

## 2012-09-14 LAB — MORPHOLOGY: PLT EST: ADEQUATE

## 2012-09-14 LAB — CHCC SMEAR

## 2012-09-14 NOTE — Telephone Encounter (Signed)
appts made and printed for pt aom °

## 2012-09-16 ENCOUNTER — Telehealth: Payer: Self-pay | Admitting: *Deleted

## 2012-09-16 LAB — ERYTHROCYTE PORPHYRIN: Erythrocyte Porphyrin, WB: 19 ug/dL (ref 0–35)

## 2012-09-16 LAB — LEAD, BLOOD: Lead-Whole Blood: 1.7 ug/dL (ref <25.0)

## 2012-09-16 NOTE — Progress Notes (Signed)
Hematology and Oncology Follow Up Visit  Megan [name] [id number] 23-Dec-1955 57 y.o. 09/16/2012 12:48 PM   Principle Diagnosis: Encounter Diagnoses  Name Primary?  . Peripheral neuropathy   . 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 Please see my 06/09/2012 office consult for full details of her medical history. She has a mild, chronic, leukopenia with a normal white count differential without associated anemia or thrombocytopenia. Other than decreased white cells, review of the peripheral blood film was unremarkable. I did not feel that the leukopenia was related to her peripheral neuropathy. She reported atypical cyanotic changes of the soles of her feet. There was no evidence for any large vessel vascular compromise with excellent pulses in her extremities. No gross evidence for a chronic inflammatory disorder with a ESR of 1. Cryoglobulin screen x2 was  unrevealing. Previous evaluation for underlying collagen vascular disorder or vasculitis was unrevealing. Screening evaluation for myeloma and amyloidosis was negative. Viral panel including hepatitis A, B., C., HIV, EBV all negative. She had had multiple prior neurological evaluations locally and at [facility]. She had been given a trial of a calcium channel blocker in the past but didn't tolerate it. I recommended that she try Aggrenox. She elected not to start the Aggrenox. She sought advice from an alternative medicine practitioner in [place]. She brings me a report that shows that urine lead levels are elevated. She has had no exposure to lead paints, lead batteries, lead gasoline, or any other lead-containing products. She is not anemic. Parenteral chelation therapy was recommended by the alternative medicine practitioner. I told her before considering this that we really should  check blood lead levels and a free erythrocyte protoporphyrin level. Low levels of lead in the blood often do not require any treatment at all. Moderate levels could be treated with an oral medication such as DMSA. Only extreme high levels would be treated with parenteral chelation agents.  She reports that since her visit with me she went on a Vegan diet and has had significant improvement in her symptoms.  Medications: reviewed  Allergies: No Known Allergies   Physical Exam: The patient was not examined today. Blood pressure 108/62, pulse 62, temperature 96.9 F (36.1 C), temperature source Oral, resp. rate 20, height 5\' 10"  (1.778 m), weight 128 lb 9.6 oz (58.333 kg). Wt Readings from Last 3 Encounters:  09/14/12 128 lb 9.6 oz (58.333 kg)  06/09/12 125 lb 6.4 oz (56.881 kg)  04/23/11 125 lb (56.7 kg)      Lab Results: Lab Results  Component Value Date   WBC 3.1* 09/14/2012   HGB 14.1 09/14/2012   HCT 40.5 09/14/2012   MCV 87.9 09/14/2012   PLT 211 09/14/2012     Chemistry      Component Value Date/Time   NA 140 05/24/2012 1053   NA 142 05/21/2012 0104   K 4.1 05/24/2012 1053   K 4.0 05/21/2012 0104   CL 106 05/24/2012 1053   CL 104 05/21/2012 0104   CO2 27 05/24/2012 1053   CO2 30 05/21/2012 0104   BUN 13.0 05/24/2012 1053   BUN 14 05/21/2012 0104   CREATININE 0.7 05/24/2012 1053   CREATININE 0.59 05/21/2012 0104      Component Value Date/Time   CALCIUM 9.5 05/24/2012 1053   CALCIUM 9.8 05/21/2012 0104   ALKPHOS 65 05/24/2012 1053  ALKPHOS 74 05/21/2012 0104   AST 29 05/24/2012 1053   AST 30 05/21/2012 0104   ALT 13 05/24/2012 1053   ALT 13 05/21/2012 0104   BILITOT 0.50 05/24/2012 1053   BILITOT 0.2* 05/21/2012 0104     White count differential 55% neutrophils 32 lymphocytes 10 monocytes    Impression and Plan: #1. Mild, chronic, stable leukopenia.  #2. Idiopathic peripheral neuropathy and cyanotic skin changes Some improvement in symptoms on a Vegan diet.  #3. Possible  elevated urine lead levels See discussion above. I will check blood lead levels and FEP and then call the patient  100% of today's visit was spent with face-to-face consultation and review of data   CC:.    [name], MD 1/23/201412:48 PM

## 2012-09-16 NOTE — Telephone Encounter (Signed)
08/11/12 letter from Dr. Cyndie Chime faxed again to [facility] @ [phone] & cc to [facility] @ [phone].

## 2012-09-27 ENCOUNTER — Encounter: Payer: Self-pay | Admitting: Oncology

## 2012-09-27 NOTE — Progress Notes (Signed)
57 y/o woman with idiopathic leukopenia and peripheral neuropathy which I don't believe are related.  She was advised  to take chelation therapy by an alternative medicine provider based on a urine lead screen. I checked plasma lead level which is 1.7; reference range <25 & Free Erythrocyte Protoporphyrin which was 19 normal range 0-35. There is no indication for chelation therapy. I called patient and left her a message to this effect.

## 2012-10-07 ENCOUNTER — Ambulatory Visit: Payer: BC Managed Care – PPO | Admitting: Cardiovascular Disease

## 2012-10-09 ENCOUNTER — Other Ambulatory Visit: Payer: Self-pay

## 2012-10-19 ENCOUNTER — Other Ambulatory Visit (INDEPENDENT_AMBULATORY_CARE_PROVIDER_SITE_OTHER): Payer: Self-pay | Admitting: Otolaryngology

## 2012-10-19 DIAGNOSIS — J320 Chronic maxillary sinusitis: Secondary | ICD-10-CM

## 2012-10-20 ENCOUNTER — Ambulatory Visit
Admission: RE | Admit: 2012-10-20 | Discharge: 2012-10-20 | Disposition: A | Discharge status: Home / Self Care | Payer: BC Managed Care – PPO | Source: Ambulatory Visit | Attending: Otolaryngology | Admitting: Otolaryngology

## 2012-10-20 DIAGNOSIS — J320 Chronic maxillary sinusitis: Secondary | ICD-10-CM

## 2012-10-22 ENCOUNTER — Ambulatory Visit: Payer: BC Managed Care – PPO | Admitting: Cardiology

## 2012-11-24 ENCOUNTER — Ambulatory Visit: Payer: BC Managed Care – PPO | Admitting: Cardiology

## 2012-12-09 ENCOUNTER — Other Ambulatory Visit: Payer: Self-pay | Admitting: Gastroenterology

## 2012-12-21 ENCOUNTER — Ambulatory Visit: Payer: BC Managed Care – PPO | Admitting: Cardiology

## 2013-01-20 ENCOUNTER — Telehealth: Payer: Self-pay | Admitting: Vascular Surgery

## 2013-01-20 NOTE — Telephone Encounter (Signed)
Patient lm at ext [phone]- wanted to make an appt for spider veins and to evaluate her vascular issues. She also wanted to know if we were in network with [employer].  01/20/13 11:15: I left a message on [phone] to explain that for her spider veins, we could most definitely get her in contact with Clementeen Hoof, RN who specializes in sclerotherapy. As for her vascular issues, I would need to speak with her to determine if she needs a referral, I explained that we just needed more information to determine the next step. I also addressed the [employer] question, stating that [place] was in network with most BCBS plans. Asked that patient call back for more information, dpm

## 2013-01-26 ENCOUNTER — Ambulatory Visit: Payer: BC Managed Care – PPO | Admitting: Cardiology

## 2013-02-02 ENCOUNTER — Encounter (HOSPITAL_COMMUNITY): Payer: Self-pay | Admitting: Pharmacy Technician

## 2013-02-04 ENCOUNTER — Ambulatory Visit: Payer: BC Managed Care – PPO | Admitting: Cardiology

## 2013-02-04 ENCOUNTER — Encounter (HOSPITAL_COMMUNITY): Payer: Self-pay | Admitting: *Deleted

## 2013-02-14 ENCOUNTER — Encounter: Payer: Self-pay | Admitting: Vascular Surgery

## 2013-02-15 ENCOUNTER — Ambulatory Visit: Payer: BC Managed Care – PPO | Admitting: Vascular Surgery

## 2013-02-15 ENCOUNTER — Ambulatory Visit (INDEPENDENT_AMBULATORY_CARE_PROVIDER_SITE_OTHER): Payer: BC Managed Care – PPO | Admitting: Vascular Surgery

## 2013-02-15 ENCOUNTER — Encounter (INDEPENDENT_AMBULATORY_CARE_PROVIDER_SITE_OTHER): Payer: BC Managed Care – PPO | Admitting: Vascular Surgery

## 2013-02-15 ENCOUNTER — Other Ambulatory Visit: Payer: Self-pay

## 2013-02-15 ENCOUNTER — Encounter: Payer: Self-pay | Admitting: Vascular Surgery

## 2013-02-15 VITALS — BP 94/34 | HR 65 | Resp 16 | Ht 69.5 in | Wt 130.0 lb

## 2013-02-15 DIAGNOSIS — M79609 Pain in unspecified limb: Secondary | ICD-10-CM

## 2013-02-15 DIAGNOSIS — I83893 Varicose veins of bilateral lower extremities with other complications: Secondary | ICD-10-CM

## 2013-02-15 DIAGNOSIS — R609 Edema, unspecified: Secondary | ICD-10-CM

## 2013-02-15 DIAGNOSIS — I789 Disease of capillaries, unspecified: Secondary | ICD-10-CM

## 2013-02-15 DIAGNOSIS — I781 Nevus, non-neoplastic: Secondary | ICD-10-CM

## 2013-02-15 NOTE — Progress Notes (Signed)
Subjective:     Patient ID: Megan [name], female   DOB: 05/07/1956, 57 y.o.   MRN: [id number]  HPI this 57 year old female was evaluated for probable venous insufficiency with discomfort in both legs. She has no history of DVT, thrombophlebitis, stasis ulcers, bleeding, or bulging varicose veins. She states for the last year she has been having some increasing discomfort in both legs below the knees with occasional swelling in the ankle and foot areas. She does not wear elastic compression stockings to elevate her legs. She is able to ambulate at least 2 miles.  Past Medical History  Diagnosis Date  . MVP (mitral valve prolapse)   . Heart murmur   . Depression   . Chest pain   . Orthostatic hypotension   . Thyroid disease   . Neuropathy   . Peripheral neuropathy 06/09/2012  . Hypothyroidism     History  Substance Use Topics  . Smoking status: Never Smoker   . Smokeless tobacco: Never Used  . Alcohol Use: No    Family History  Problem Relation Age of Onset  . Hyperlipidemia Father   . Heart failure Father   . Prostate cancer Father   . Kidney failure Father   . Angina Mother   . Hypertension Mother   . Lung cancer Mother     No Known Allergies  Current outpatient prescriptions:acidophilus (RISAQUAD) CAPS, Take 1 capsule by mouth daily., Disp: , Rfl: ;  ASTRAGALUS PO, Take 1 tablet by mouth daily., Disp: , Rfl: ;  B Complex-C (B-COMPLEX WITH VITAMIN C) tablet, Take 1 tablet by mouth daily., Disp: , Rfl: ;  cholecalciferol (VITAMIN D) 1000 UNITS tablet, Take 1,000 Units by mouth daily., Disp: , Rfl:  levothyroxine (SYNTHROID, LEVOTHROID) 25 MCG tablet, Take 25 mcg by mouth daily before breakfast. , Disp: , Rfl: ;  Multiple Vitamin (MULTIVITAMIN WITH MINERALS) TABS, Take 1 tablet by mouth daily., Disp: , Rfl: ;  Multiple Vitamins-Iron (CHLORELLA PO), Take 10 tablets by mouth 2 (two) times daily., Disp: , Rfl: ;  OVER THE COUNTER MEDICATION, Take 1 tablet by mouth daily. Biosil 5  mg-Choline 100 mg.Hair skin nail vitamin, Disp: , Rfl:  venlafaxine (EFFEXOR) 37.5 MG tablet, Take 37.5 mg by mouth at bedtime., Disp: , Rfl:   BP 94/34  Pulse 65  Resp 16  Ht 5' 9.5" (1.765 m)  Wt 130 lb (58.968 kg)  BMI 18.93 kg/m2  Body mass index is 18.93 kg/(m^2).           Review of Systems denies chest pain, dyspnea on exertion, PND, orthopnea. Does have history of neuropathy in both legs evaluated by neurologist. This leads to some numbness. He is also noted bluish discoloration and feet.     Objective:   Physical Exam blood pressure 94/34 heart rate 65 respirations 16 Gen.-alert and oriented x3 in no apparent distress HEENT normal for age Lungs no rhonchi or wheezing Cardiovascular regular rhythm no murmurs carotid pulses 3+ palpable no bruits audible Abdomen soft nontender no palpable masses Musculoskeletal free of  major deformities Skin clear -no rashes Neurologic normal Lower extremities 3+ femoral and dorsalis pedis pulses palpable bilaterally with no edema A few scattered spider veins in the lateral and medial thigh areas. Some bluish discoloration particularly on the plantar surface of the left foot with no infection or ischemia.  Today I ordered bilateral venous duplex exam which I reviewed and interpreted. Both studies are totally normal with the exception of a segment of the great saphenous  vein in the distal thigh which is chronically thrombosed suggestive of a previous history of silent thrombophlebitis. There is no DVT or other abnormalities.     Assessment:     Bilateral lower extremity discomfort with bilateral spider veins History of mild neuropathy both lower extremities History of silent thrombophlebitis right distal thigh in the great saphenous vein-of no significance at this point in time -- asymptomatic    Plan:     Patient will consider sclerotherapy for the spider veins. Have suggested that she wear short-leg elastic compression  stockings to see if this improves her discomfort which it may or may not. No treatment indicated for chronic thrombosis of distal great saphenous vein

## 2013-02-22 ENCOUNTER — Ambulatory Visit (HOSPITAL_COMMUNITY)
Admission: RE | Admit: 2013-02-22 | Discharge: 2013-02-22 | Disposition: A | Discharge status: Home / Self Care | Payer: BC Managed Care – PPO | Source: Ambulatory Visit | Attending: Gastroenterology | Admitting: Gastroenterology

## 2013-02-22 ENCOUNTER — Encounter (HOSPITAL_COMMUNITY): Payer: Self-pay | Admitting: Anesthesiology

## 2013-02-22 ENCOUNTER — Encounter (HOSPITAL_COMMUNITY): Payer: Self-pay | Admitting: *Deleted

## 2013-02-22 ENCOUNTER — Encounter (HOSPITAL_COMMUNITY)
Admission: RE | Disposition: A | Discharge status: Home / Self Care | Payer: Self-pay | Source: Ambulatory Visit | Attending: Gastroenterology

## 2013-02-22 ENCOUNTER — Ambulatory Visit (HOSPITAL_COMMUNITY): Payer: BC Managed Care – PPO | Admitting: Anesthesiology

## 2013-02-22 DIAGNOSIS — Z800 Family history of malignant neoplasm of digestive organs: Secondary | ICD-10-CM | POA: Insufficient documentation

## 2013-02-22 DIAGNOSIS — J45909 Unspecified asthma, uncomplicated: Secondary | ICD-10-CM | POA: Insufficient documentation

## 2013-02-22 DIAGNOSIS — E039 Hypothyroidism, unspecified: Secondary | ICD-10-CM | POA: Insufficient documentation

## 2013-02-22 DIAGNOSIS — Z85828 Personal history of other malignant neoplasm of skin: Secondary | ICD-10-CM | POA: Insufficient documentation

## 2013-02-22 DIAGNOSIS — E7800 Pure hypercholesterolemia, unspecified: Secondary | ICD-10-CM | POA: Insufficient documentation

## 2013-02-22 DIAGNOSIS — Z880 Allergy status to penicillin: Secondary | ICD-10-CM | POA: Insufficient documentation

## 2013-02-22 DIAGNOSIS — Z1211 Encounter for screening for malignant neoplasm of colon: Secondary | ICD-10-CM | POA: Insufficient documentation

## 2013-02-22 DIAGNOSIS — M810 Age-related osteoporosis without current pathological fracture: Secondary | ICD-10-CM | POA: Insufficient documentation

## 2013-02-22 DIAGNOSIS — I059 Rheumatic mitral valve disease, unspecified: Secondary | ICD-10-CM | POA: Insufficient documentation

## 2013-02-22 DIAGNOSIS — Z881 Allergy status to other antibiotic agents status: Secondary | ICD-10-CM | POA: Insufficient documentation

## 2013-02-22 HISTORY — DX: Hypothyroidism, unspecified: E03.9

## 2013-02-22 HISTORY — PX: COLONOSCOPY WITH PROPOFOL: SHX5780

## 2013-02-22 SURGERY — COLONOSCOPY WITH PROPOFOL
Anesthesia: Monitor Anesthesia Care

## 2013-02-22 MED ORDER — PROPOFOL INFUSION 10 MG/ML OPTIME
INTRAVENOUS | Status: DC | PRN
  Administered 2013-02-22: 100 ug/kg/min via INTRAVENOUS

## 2013-02-22 MED ORDER — GLYCOPYRROLATE 0.2 MG/ML IJ SOLN
INTRAMUSCULAR | Status: DC | PRN
  Administered 2013-02-22: .04 mg via INTRAVENOUS
  Administered 2013-02-22 (×2): .08 mg via INTRAVENOUS

## 2013-02-22 MED ORDER — PROMETHAZINE HCL 25 MG/ML IJ SOLN: 6.2500 mg | INTRAMUSCULAR | Status: DC | PRN

## 2013-02-22 MED ORDER — LACTATED RINGERS IV SOLN
INTRAVENOUS | Status: DC
  Administered 2013-02-22: 1000 mL via INTRAVENOUS

## 2013-02-22 MED ORDER — KETAMINE HCL 10 MG/ML IJ SOLN
INTRAMUSCULAR | Status: DC | PRN
  Administered 2013-02-22: 25 mg via INTRAVENOUS

## 2013-02-22 MED ORDER — PROPOFOL 10 MG/ML IV BOLUS
INTRAVENOUS | Status: DC | PRN
  Administered 2013-02-22: 50 mg via INTRAVENOUS

## 2013-02-22 SURGICAL SUPPLY — 21 items

## 2013-02-22 NOTE — H&P (Signed)
  Procedure: Screening colonoscopy.  History: The patient is a 57 year old female born Apr 27, 1956. Her mother was diagnosed with colon cancer in her late 19s.  The patient is scheduled to undergo a screening colonoscopy.  Chronic medications: Fish oil. Calcium. Vitamin D. Effexor. Gabapentin. Synthroid.  Past medical history: Tonsillectomy. Wisdom teeth removal. Osteoporosis. Hypercholesterolemia. Asthma. Depression. Thank attacks. Basal cell skin cancers. Mitral valve prolapse. Hypothyroidism.  Allergies: Clindamycin causes diarrhea. Augmentin causes diarrhea.  Exam: The patient is alert and lying comfortably on the endoscopy stretcher. Abdomen is soft, flat, and nontender to palpation. Cardiac exam reveals a regular rhythm. Lungs are clear to auscultation.  Plan: Proceed with screening colonoscopy.

## 2013-02-22 NOTE — Transfer of Care (Signed)
Immediate Anesthesia Transfer of Care Note  Patient: Megan [name]  Procedure(s) Performed: Procedure(s): COLONOSCOPY WITH PROPOFOL (N/A)  Patient Location: PACU  Anesthesia Type:MAC  Level of Consciousness: awake, alert , oriented, patient cooperative and responds to stimulation  Airway & Oxygen Therapy: Patient Spontanous Breathing and Patient connected to face mask oxygen  Post-op Assessment: Report given to PACU RN, Post -op Vital signs reviewed and stable and Patient moving all extremities  Post vital signs: Reviewed and stable  Complications: No apparent anesthesia complications

## 2013-02-22 NOTE — Preoperative (Signed)
Beta Blockers   Reason not to administer Beta Blockers:Not Applicable, not on home BB 

## 2013-02-22 NOTE — Anesthesia Postprocedure Evaluation (Signed)
  Anesthesia Post-op Note  Patient: Megan [name]  Procedure(s) Performed: Procedure(s) (LRB): COLONOSCOPY WITH PROPOFOL (N/A)  Patient Location: PACU  Anesthesia Type: MAC  Level of Consciousness: awake and alert   Airway and Oxygen Therapy: Patient Spontanous Breathing  Post-op Pain: mild  Post-op Assessment: Post-op Vital signs reviewed, Patient's Cardiovascular Status Stable, Respiratory Function Stable, Patent Airway and No signs of Nausea or vomiting  Last Vitals:  Filed Vitals:   02/22/13 1344  BP: 98/59  Temp:   Resp: 14    Post-op Vital Signs: stable   Complications: No apparent anesthesia complications

## 2013-02-22 NOTE — Op Note (Signed)
Procedure: Screening colonoscopy. Mother diagnosed with colon cancer in her late 66s.  Endoscopist: Danise Edge  Premedication: Propofol administered by anesthesia  Procedure: The patient was placed in the left lateral decubitus position. Anal inspection and digital rectal exam were normal. The Pentax pediatric colonoscope was introduced into the rectum and advanced to the cecum. A normal-appearing ileocecal valve and appendiceal orifice were identified. Colonic preparation for the exam today was good.  Rectum. Normal. Retroflex view of the distal rectum normal.  Sigmoid colon and descending colon. Normal.  Splenic flexure. Normal.  Transverse colon. Normal.  Hepatic flexure. Normal.  Ascending colon. Normal.  Cecum and ileocecal valve. Normal.  Assessment: Normal screening proctocolonoscopy to the cecum  Recommendations: Schedule repeat screening colonoscopy in approximately 5 years.

## 2013-02-22 NOTE — Anesthesia Preprocedure Evaluation (Signed)
Anesthesia Evaluation  Patient identified by MRN, date of birth, ID band Patient awake    Reviewed: Allergy & Precautions, H&P , NPO status , Patient's Chart, lab work & pertinent test results  Airway Mallampati: II TM Distance: >3 FB Neck ROM: Full    Dental no notable dental hx.    Pulmonary neg pulmonary ROS,  breath sounds clear to auscultation  Pulmonary exam normal       Cardiovascular negative cardio ROS  Rhythm:Regular Rate:Normal     Neuro/Psych negative neurological ROS  negative psych ROS   GI/Hepatic negative GI ROS, Neg liver ROS,   Endo/Other  Hypothyroidism   Renal/GU negative Renal ROS  negative genitourinary   Musculoskeletal negative musculoskeletal ROS (+)   Abdominal   Peds negative pediatric ROS (+)  Hematology negative hematology ROS (+)   Anesthesia Other Findings   Reproductive/Obstetrics negative OB ROS                           Anesthesia Physical Anesthesia Plan  ASA: II  Anesthesia Plan: MAC   Post-op Pain Management:    Induction: Intravenous  Airway Management Planned: Nasal Cannula  Additional Equipment:   Intra-op Plan:   Post-operative Plan:   Informed Consent: I have reviewed the patients History and Physical, chart, labs and discussed the procedure including the risks, benefits and alternatives for the proposed anesthesia with the patient or authorized representative who has indicated his/her understanding and acceptance.     Plan Discussed with: CRNA and Surgeon  Anesthesia Plan Comments:         Anesthesia Quick Evaluation  

## 2013-02-23 ENCOUNTER — Encounter (HOSPITAL_COMMUNITY): Payer: Self-pay | Admitting: Gastroenterology

## 2013-02-23 ENCOUNTER — Other Ambulatory Visit: Payer: Self-pay | Admitting: Gastroenterology

## 2013-02-23 ENCOUNTER — Ambulatory Visit
Admission: RE | Admit: 2013-02-23 | Discharge: 2013-02-23 | Disposition: A | Discharge status: Home / Self Care | Payer: BC Managed Care – PPO | Source: Ambulatory Visit | Attending: Gastroenterology | Admitting: Gastroenterology

## 2013-02-23 DIAGNOSIS — R079 Chest pain, unspecified: Secondary | ICD-10-CM

## 2013-02-23 DIAGNOSIS — R0602 Shortness of breath: Secondary | ICD-10-CM

## 2013-03-15 ENCOUNTER — Other Ambulatory Visit (HOSPITAL_BASED_OUTPATIENT_CLINIC_OR_DEPARTMENT_OTHER): Payer: BC Managed Care – PPO | Admitting: Lab

## 2013-03-15 ENCOUNTER — Ambulatory Visit (HOSPITAL_BASED_OUTPATIENT_CLINIC_OR_DEPARTMENT_OTHER): Payer: BC Managed Care – PPO | Admitting: Oncology

## 2013-03-15 VITALS — BP 101/60 | HR 59 | Temp 97.6°F | Resp 18 | Ht 69.0 in | Wt 130.2 lb

## 2013-03-15 DIAGNOSIS — D72819 Decreased white blood cell count, unspecified: Secondary | ICD-10-CM

## 2013-03-15 DIAGNOSIS — G629 Polyneuropathy, unspecified: Secondary | ICD-10-CM

## 2013-03-15 DIAGNOSIS — G609 Hereditary and idiopathic neuropathy, unspecified: Secondary | ICD-10-CM

## 2013-03-15 LAB — CBC WITH DIFFERENTIAL/PLATELET
BASO%: 1.7 % (ref 0.0–2.0)
Basophils Absolute: 0.1 10*3/uL (ref 0.0–0.1)
EOS%: 1.2 % (ref 0.0–7.0)
Eosinophils Absolute: 0 10*3/uL (ref 0.0–0.5)
HCT: 40.8 % (ref 34.8–46.6)
HGB: 13.7 g/dL (ref 11.6–15.9)
LYMPH%: 32.4 % (ref 14.0–49.7)
MCH: 29.9 pg (ref 25.1–34.0)
MCHC: 33.6 g/dL (ref 31.5–36.0)
MCV: 88.9 fL (ref 79.5–101.0)
MONO#: 0.3 10*3/uL (ref 0.1–0.9)
MONO%: 10.4 % (ref 0.0–14.0)
NEUT#: 1.6 10*3/uL (ref 1.5–6.5)
NEUT%: 54.3 % (ref 38.4–76.8)
Platelets: 210 10*3/uL (ref 145–400)
RBC: 4.59 10*6/uL (ref 3.70–5.45)
RDW: 13.8 % (ref 11.2–14.5)
WBC: 3 10*3/uL — ABNORMAL LOW (ref 3.9–10.3)
lymph#: 1 10*3/uL (ref 0.9–3.3)

## 2013-03-15 LAB — MORPHOLOGY: PLT EST: ADEQUATE

## 2013-03-18 NOTE — Progress Notes (Signed)
Hematology and Oncology Follow Up Visit  Megan [name] [id number] 1955/09/29 57 y.o. 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 I initially evaluated her here in October 2013 for unexplained, mild, chronic, neutropenia with a normal differential count and without any associated anemia or thrombocytopenia. She had no history of recurrent infections. She did report idiopathic neuropathy and atypical intermittent cyanotic changes of the soles of her feet. There were no signs or symptoms to suggest that she had vasculitis. ESR 1 mm. Peripheral pulses all strong and symmetric. Myeloma screening in both serum and urine was negative. Cryoglobulin screen negative. Hepatitis A, B., C., HIV, EBV all negative. There was possible lead exposure. Plasma lead levels and free erythrocye protoporphyrin levels were in normal range. She has had a vascular surgery evaluation with both venous and arterial Doppler studies which were normal. She did get some symptom relief from a Vegan diet.  She reports no new symptomatology today.   Medications: reviewed  Allergies: No Known Allergies  Review of Systems: unremarkable except as noted in history of present illness   Physical Exam: Blood pressure 101/60, pulse 59, temperature 97.6 F (36.4 C), temperature source Oral, resp. rate 18, height 5\' 9"  (1.753 m), weight 130 lb 3.2 oz (59.058 kg). Wt Readings from Last 3 Encounters:  03/15/13 130 lb 3.2 oz (59.058 kg)  02/22/13 130 lb (58.968 kg)  02/22/13 130 lb (58.968 kg)     General appearance:  thin but adequately nourished Caucasian woman  HENNT:  pharynx no erythema exudate or mass Lymph nodes:  no lymphadenopathy  Breasts: Lungs: clear to auscultation resonant to percussion  Heart: regular rhythm no murmur  Abdomen: soft, nontender, no mass, no organomegaly Extremities: no  edema, no calf tenderness  Musculoskeletal: no joint deformities  GU: Vascular: Currently no cyanosis. Radial and her cells pedis pulses normal. Neurologic:motor strength 5 over 5, reflexes 1+ symmetric, decreased sensation to vibration over the fingertips. Gait not tested today. Previous exam also with significant decrease in vibration.  Skin: no rash or ecchymosis   Lab Results: Lab Results  Component Value Date   WBC 3.0* 03/15/2013   HGB 13.7 03/15/2013   HCT 40.8 03/15/2013   MCV 88.9 03/15/2013   PLT 210 03/15/2013  White count differential: 54% neutrophils, 32 lymphocytes, 10 monocytes, 1 eosinophil, 2 basophils.    Chemistry      Component Value Date/Time   NA 140 05/24/2012 1053   NA 142 05/21/2012 0104   K 4.1 05/24/2012 1053   K 4.0 05/21/2012 0104   CL 106 05/24/2012 1053   CL 104 05/21/2012 0104   CO2 27 05/24/2012 1053   CO2 30 05/21/2012 0104   BUN 13.0 05/24/2012 1053   BUN 14 05/21/2012 0104   CREATININE 0.7 05/24/2012 1053   CREATININE 0.59 05/21/2012 0104      Component Value Date/Time   CALCIUM 9.5 05/24/2012 1053   CALCIUM 9.8 05/21/2012 0104   ALKPHOS 65 05/24/2012 1053   ALKPHOS 74 05/21/2012 0104   AST 29 05/24/2012 1053   AST 30 05/21/2012 0104   ALT 13 05/24/2012 1053   ALT 13 05/21/2012 0104   BILITOT 0.50 05/24/2012 1053   BILITOT 0.2* 05/21/2012 0104       Radiological Studies: Dg Abd Acute W/chest  02/23/2013   *RADIOLOGY REPORT*  Clinical Data: Short of breath, chest pain after  colonoscopy yesterday  ACUTE ABDOMEN SERIES (ABDOMEN 2 VIEW & CHEST 1 VIEW)  Comparison: Chest x-ray of 05/20/2012  Findings: No active infiltrate or effusion is seen.  Mediastinal contours appear normal.  The heart is within normal limits in size.  Supine and erect views of the abdomen show no bowel obstruction. No free air is seen. On the erect view of the abdomen, a moderate amount of feces is noted throughout the colon.  No opaque calculi are seen.  IMPRESSION:  1.  No active lung  disease. 2.  No bowel obstruction.  No free air.   Original Report Authenticated By: Dwyane Dee, M.D.    Impression:  #1. Idiopathic leukopenia Blood counts stable. Normal differential. No anemia. No thrombocytopenia. I would continue observation alone. Check blood counts twice a year.   #2. Idiopathic peripheral neuropathy and atypical cyanotic changes of the feet. No gross evidence for vasculitis. No evidence for a monoclonal gammopathy or cryoglobulinemia.  #3. Hypothyroid on replacement.    CC:.  Dr. Kirby Funk; Dr. Porfirio Mylar Dohmeir; Dr. Jerl Mina    [name], MD 7/25/20146:33 PM

## 2013-03-28 ENCOUNTER — Ambulatory Visit: Payer: BC Managed Care – PPO | Admitting: Cardiology

## 2013-06-01 ENCOUNTER — Ambulatory Visit: Payer: BC Managed Care – PPO | Admitting: Cardiology

## 2013-06-09 ENCOUNTER — Ambulatory Visit: Payer: Self-pay | Admitting: Neurology

## 2013-06-15 ENCOUNTER — Emergency Department (HOSPITAL_COMMUNITY)
Admission: EM | Admit: 2013-06-15 | Discharge: 2013-06-15 | Disposition: A | Discharge status: Home / Self Care | Payer: BC Managed Care – PPO | Attending: Emergency Medicine | Admitting: Emergency Medicine

## 2013-06-15 ENCOUNTER — Encounter (HOSPITAL_COMMUNITY): Payer: Self-pay | Admitting: Emergency Medicine

## 2013-06-15 ENCOUNTER — Emergency Department (HOSPITAL_COMMUNITY): Payer: BC Managed Care – PPO

## 2013-06-15 DIAGNOSIS — Z79899 Other long term (current) drug therapy: Secondary | ICD-10-CM | POA: Insufficient documentation

## 2013-06-15 DIAGNOSIS — E039 Hypothyroidism, unspecified: Secondary | ICD-10-CM | POA: Insufficient documentation

## 2013-06-15 DIAGNOSIS — Z8669 Personal history of other diseases of the nervous system and sense organs: Secondary | ICD-10-CM | POA: Insufficient documentation

## 2013-06-15 DIAGNOSIS — F329 Major depressive disorder, single episode, unspecified: Secondary | ICD-10-CM | POA: Insufficient documentation

## 2013-06-15 DIAGNOSIS — R0609 Other forms of dyspnea: Secondary | ICD-10-CM | POA: Insufficient documentation

## 2013-06-15 DIAGNOSIS — R0989 Other specified symptoms and signs involving the circulatory and respiratory systems: Secondary | ICD-10-CM | POA: Insufficient documentation

## 2013-06-15 DIAGNOSIS — R011 Cardiac murmur, unspecified: Secondary | ICD-10-CM | POA: Insufficient documentation

## 2013-06-15 DIAGNOSIS — Z8679 Personal history of other diseases of the circulatory system: Secondary | ICD-10-CM | POA: Insufficient documentation

## 2013-06-15 DIAGNOSIS — R0600 Dyspnea, unspecified: Secondary | ICD-10-CM

## 2013-06-15 DIAGNOSIS — F3289 Other specified depressive episodes: Secondary | ICD-10-CM | POA: Insufficient documentation

## 2013-06-15 DIAGNOSIS — R42 Dizziness and giddiness: Secondary | ICD-10-CM | POA: Insufficient documentation

## 2013-06-15 DIAGNOSIS — R0789 Other chest pain: Secondary | ICD-10-CM | POA: Insufficient documentation

## 2013-06-15 LAB — BASIC METABOLIC PANEL
BUN: 15 mg/dL (ref 6–23)
CO2: 29 mEq/L (ref 19–32)
Calcium: 9.6 mg/dL (ref 8.4–10.5)
Chloride: 101 mEq/L (ref 96–112)
Creatinine, Ser: 0.57 mg/dL (ref 0.50–1.10)
GFR calc Af Amer: >90 mL/min (ref >90)
GFR calc non Af Amer: >90 mL/min (ref >90)
Glucose, Bld: 88 mg/dL (ref 70–99)
Potassium: 3.7 mEq/L (ref 3.5–5.1)
Sodium: 140 mEq/L (ref 135–145)

## 2013-06-15 LAB — POCT I-STAT TROPONIN I
Troponin i, poc: 0 ng/mL (ref 0.00–0.08)
Troponin i, poc: 0 ng/mL (ref 0.00–0.08)

## 2013-06-15 LAB — CBC
HCT: 41.4 % (ref 36.0–46.0)
Hemoglobin: 14.2 g/dL (ref 12.0–15.0)
MCH: 30.3 pg (ref 26.0–34.0)
MCHC: 34.3 g/dL (ref 30.0–36.0)
MCV: 88.3 fL (ref 78.0–100.0)
Platelets: 210 10*3/uL (ref 150–400)
RBC: 4.69 MIL/uL (ref 3.87–5.11)
RDW: 12.5 % (ref 11.5–15.5)
WBC: 3.4 10*3/uL — ABNORMAL LOW (ref 4.0–10.5)

## 2013-06-15 MED ORDER — METHYLPREDNISOLONE SODIUM SUCC 125 MG IJ SOLR
125.0000 mg | Freq: Once | INTRAMUSCULAR | Status: AC
  Administered 2013-06-15: 125 mg via INTRAMUSCULAR
  Filled 2013-06-15: qty 2

## 2013-06-15 MED ORDER — PREDNISONE 50 MG PO TABS: 50.0000 mg | ORAL_TABLET | Freq: Every day | ORAL | Status: DC

## 2013-06-15 MED ORDER — ALBUTEROL SULFATE HFA 108 (90 BASE) MCG/ACT IN AERS: 2.0000 | INHALATION_SPRAY | Freq: Four times a day (QID) | RESPIRATORY_TRACT | Status: DC | PRN

## 2013-06-15 MED ORDER — ALBUTEROL SULFATE (5 MG/ML) 0.5% IN NEBU
5.0000 mg | INHALATION_SOLUTION | Freq: Once | RESPIRATORY_TRACT | Status: AC
  Administered 2013-06-15: 5 mg via RESPIRATORY_TRACT
  Filled 2013-06-15: qty 1

## 2013-06-15 NOTE — ED Provider Notes (Signed)
CSN: [id number]     Arrival date & time 06/15/13  0008 History   First MD Initiated Contact with Patient 06/15/13 0423     Chief Complaint  Patient presents with  . Shortness of Breath   (Consider location/radiation/quality/duration/timing/severity/associated sxs/prior Treatment) HPI Patient presents with several days of bandlike chest tightness and difficulty taking a deep breath in. She states that yesterday became acutely worse. She denies any wheezing or coughing. She has no fevers or chills. She denies any lower sugary swelling or pain. She states that her chest tightness and difficulty breathing has improved while being at the hospital. She denies any recent travel or surgery. Past Medical History  Diagnosis Date  . MVP (mitral valve prolapse)   . Heart murmur   . Depression   . Chest pain   . Orthostatic hypotension   . Thyroid disease   . Neuropathy   . Peripheral neuropathy 06/09/2012  . Hypothyroidism    Past Surgical History  Procedure Laterality Date  . Tonsillectomy    . Tonsillectomy    . Mouth surgery  01/2012    bone graft in mouth  . Colonoscopy with propofol N/A 02/22/2013    Procedure: COLONOSCOPY WITH PROPOFOL;  Surgeon: Charolett Bumpers, MD;  Location: [facility];  Service: Endoscopy;  Laterality: N/A;   Family History  Problem Relation Age of Onset  . Hyperlipidemia Father   . Heart failure Father   . Prostate cancer Father   . Kidney failure Father   . Angina Mother   . Hypertension Mother   . Lung cancer Mother    History  Substance Use Topics  . Smoking status: Never Smoker   . Smokeless tobacco: Never Used  . Alcohol Use: No   OB History   Grav Para Term Preterm Abortions TAB SAB Ect Mult Living                 Review of Systems  Constitutional: Negative for fever and chills.  Respiratory: Positive for chest tightness and shortness of breath. Negative for cough and wheezing.   Cardiovascular: Negative for chest pain, palpitations and  leg swelling.  Gastrointestinal: Negative for nausea, vomiting, abdominal pain, diarrhea and constipation.  Musculoskeletal: Negative for back pain, myalgias and neck pain.  Skin: Negative for rash and wound.  Neurological: Positive for dizziness and light-headedness. Negative for syncope, weakness, numbness and headaches.  All other systems reviewed and are negative.    Allergies  Review of patient's allergies indicates no known allergies.  Home Medications   Current Outpatient Rx  Name  Route  Sig  Dispense  Refill  . acidophilus (RISAQUAD) CAPS   Oral   Take 1 capsule by mouth 2 (two) times daily.          . AMOXICILLIN PO   Oral   Take 1 tablet by mouth every 6 (six) hours. Dental infection         . CALCIUM PO   Oral   Take 1 tablet by mouth daily.         . cholecalciferol (VITAMIN D) 1000 UNITS tablet   Oral   Take 1,000 Units by mouth daily.         . CYANOCOBALAMIN PO   Oral   Take 1 tablet by mouth daily.         . Digestive Enzymes (DIGESTIVE ENZYME PO)   Oral   Take 1 tablet by mouth 3 (three) times daily.         [name]  levothyroxine (SYNTHROID, LEVOTHROID) 25 MCG tablet   Oral   Take 25 mcg by mouth daily before breakfast.          . MAGNESIUM PO   Oral   Take 1 tablet by mouth daily.         . Multiple Vitamin (MULTIVITAMIN WITH MINERALS) TABS   Oral   Take 1 tablet by mouth daily.         . Multiple Vitamins-Iron (CHLORELLA PO)   Oral   Take 1 tablet by mouth 2 (two) times daily.         [name] venlafaxine (EFFEXOR) 37.5 MG tablet   Oral   Take 37.5 mg by mouth at bedtime.          BP 101/66  Pulse 61  Temp(Src) 97.8 F (36.6 C) (Oral)  Resp 15  SpO2 97% Physical Exam  Nursing note and vitals reviewed. Constitutional: She is oriented to person, place, and time. She appears well-developed and well-nourished. No distress.  HENT:  Head: Normocephalic and atraumatic.  Mouth/Throat: Oropharynx is clear and moist.  Eyes:  EOM are normal. Pupils are equal, round, and reactive to light.  Neck: Normal range of motion. Neck supple.  Cardiovascular: Normal rate and regular rhythm.   Pulmonary/Chest: Effort normal. No respiratory distress. She has no wheezes. She has no rales. She exhibits no tenderness.  Patient with decreased breath sounds throughout.  Abdominal: Soft. Bowel sounds are normal. She exhibits no distension and no mass. There is no tenderness. There is no rebound and no guarding.  Musculoskeletal: Normal range of motion. She exhibits no edema and no tenderness.  No calf swelling or tenderness.  Neurological: She is alert and oriented to person, place, and time.  Patient is alert and oriented x3 with clear, goal oriented speech. Patient has 5/5 motor in all extremities. Sensation is intact to light touch.    Skin: Skin is warm and dry. No rash noted. No erythema.  Psychiatric: She has a normal mood and affect. Her behavior is normal.    ED Course  Procedures (including critical care time) Labs Review Labs Reviewed  CBC - Abnormal; Notable for the following:    WBC 3.4 (*)    All other components within normal limits  BASIC METABOLIC PANEL  POCT I-STAT TROPONIN I   Imaging Review Dg Chest 2 View  06/15/2013   CLINICAL DATA:  Shortness of breath  EXAM: CHEST  2 VIEW  COMPARISON:  Abdominal series 02/23/2013  FINDINGS: Hyperinflation. The heart size and mediastinal contours are within normal limits. Both lungs are clear. The visualized skeletal structures are unremarkable.  IMPRESSION: No active cardiopulmonary disease.   Electronically Signed   By: Burman Nieves M.D.   On: 06/15/2013 01:09    EKG Interpretation     Ventricular Rate:  66 PR Interval:  140 QRS Duration: 86 QT Interval:  424 QTC Calculation: 444 R Axis:   91 Text Interpretation:  Normal sinus rhythm Rightward axis Borderline ECG No significant change since last tracing            MDM  X-ray with hyperinflated  lungs. Patient states she's had no history of cigarette smoking but has had many years of secondhand smoke. Chest tightness the patient exhibits is bandlike across her entire chest. It is worse with taking a deep breath. It seems very unlikely to be coronary artery disease. Her initial troponin and EKG are unremarkable. We'll treat will with albuterol neb and reevaluated. Patient states  her symptoms are improved after albuterol neb. She no longer is experiencing chest tightness. We'll repeat troponin to rule out acute MI. As stated before I do not think that this patient's symptoms are cardiac but rather respiratory in origin. We'll plan on sending home with short course of steroids and albuterol inhaler. Advised patient she is to followup with her primary Dr. for possible pulmonary testing. Patient agrees with plan.   Second troponin is negative we'll discharge home  Loren Racer, MD 06/15/13 [phone]

## 2013-06-15 NOTE — ED Notes (Signed)
Pt states she is being tested for neuropathy, and they think it is now affecting the lining of her neves, but does not know if it is related to tonight's episode.  Pt currently states she is pain free, but states that pain earlier was similar to an ace bandage being wrapped around her chest.

## 2013-06-15 NOTE — ED Notes (Signed)
Presents with feeling off balance that began 10/21 AM and is intermittent, and then onset of chest tightness that began earlier in the afternoon and then progressed throughout the day. Described it as a "ace bandage wrapped around my chest" bilateral breath sounds clear, speaking in full sentences, PERLLA, no drift or droop. Recently Dx with neuropathy and being seen by a neurologist for workup for MS and cancer.  Denies cough. Reports nausea last evening but none at the moment.

## 2013-06-17 ENCOUNTER — Other Ambulatory Visit: Payer: Self-pay | Admitting: Internal Medicine

## 2013-06-17 ENCOUNTER — Ambulatory Visit
Admission: RE | Admit: 2013-06-17 | Discharge: 2013-06-17 | Disposition: A | Discharge status: Home / Self Care | Payer: BC Managed Care – PPO | Source: Ambulatory Visit | Attending: Internal Medicine | Admitting: Internal Medicine

## 2013-06-17 DIAGNOSIS — M25531 Pain in right wrist: Secondary | ICD-10-CM

## 2013-06-30 ENCOUNTER — Other Ambulatory Visit: Payer: Self-pay

## 2013-07-07 ENCOUNTER — Ambulatory Visit: Payer: BC Managed Care – PPO | Admitting: Cardiology

## 2013-08-01 ENCOUNTER — Ambulatory Visit (INDEPENDENT_AMBULATORY_CARE_PROVIDER_SITE_OTHER): Payer: BC Managed Care – PPO | Admitting: Cardiology

## 2013-08-01 ENCOUNTER — Encounter: Payer: Self-pay | Admitting: Cardiology

## 2013-08-01 VITALS — BP 115/80 | HR 66 | Ht 69.0 in | Wt 131.0 lb

## 2013-08-01 DIAGNOSIS — R0602 Shortness of breath: Secondary | ICD-10-CM

## 2013-08-01 DIAGNOSIS — I951 Orthostatic hypotension: Secondary | ICD-10-CM

## 2013-08-01 DIAGNOSIS — I341 Nonrheumatic mitral (valve) prolapse: Secondary | ICD-10-CM | POA: Insufficient documentation

## 2013-08-01 DIAGNOSIS — I059 Rheumatic mitral valve disease, unspecified: Secondary | ICD-10-CM

## 2013-08-01 NOTE — Patient Instructions (Signed)
Your physician recommends that you schedule a follow-up appointment in: AS NEEDED PENDING TEST RESULTS  Your physician has requested that you have an echocardiogram. Echocardiography is a painless test that uses sound waves to create images of your heart. It provides your doctor with information about the size and shape of your heart and how well your heart's chambers and valves are working. This procedure takes approximately one hour. There are no restrictions for this procedure.   Your physician recommends that you HAVE LAB WORK TODAY  

## 2013-08-01 NOTE — Progress Notes (Signed)
HPI: 57 year old female for evaluation of dyspnea. Stress echocardiogram in April of 2010 was normal. Venous Dopplers in June of 2014 showed chronically thrombosed greater saphenous vein graft on the right from the knee to the mid thigh but otherwise unremarkable. Patient seen in the emergency room in October 2014 for dyspnea. Chest x-ray negative. Troponins and hemoglobin as well as renal function normal. Patient states that she has occasional dyspnea with walking up stairs. She does not notice with spin classes or yoga. There is no orthopnea, PND, pedal edema, chest pain or syncope. She did fly to [place] in September. She also notes some dizziness with standing. Her symptoms resolved spontaneously. She has not had syncope. She also has some tingling in her hands and feet. Cardiology is asked to evaluate.  Current Outpatient Prescriptions  Medication Sig Dispense Refill  . acidophilus (RISAQUAD) CAPS Take 1 capsule by mouth 2 (two) times daily.       [name] albuterol (PROVENTIL HFA;VENTOLIN HFA) 108 (90 BASE) MCG/ACT inhaler Inhale 2 puffs into the lungs every 6 (six) hours as needed for wheezing or shortness of breath.  1 Inhaler  0  . CALCIUM PO Take 1 tablet by mouth daily.      . cholecalciferol (VITAMIN D) 1000 UNITS tablet Take 1,000 Units by mouth daily.      . CYANOCOBALAMIN PO Take 1 tablet by mouth daily.      . Digestive Enzymes (DIGESTIVE ENZYME PO) Take 1 tablet by mouth 3 (three) times daily.      [name] estradiol (VIVELLE-DOT) 0.0375 MG/24HR Place 1 patch onto the skin 2 (two) times a week.      . levothyroxine (SYNTHROID, LEVOTHROID) 25 MCG tablet Take 25 mcg by mouth daily before breakfast.       . MAGNESIUM PO Take 1 tablet by mouth daily.      . Multiple Vitamin (MULTIVITAMIN WITH MINERALS) TABS Take 1 tablet by mouth daily.      . Multiple Vitamins-Iron (CHLORELLA PO) Take 1 tablet by mouth 2 (two) times daily.      . Niacin (VITAMIN B-3 PO) Take 1 tablet by mouth daily.      .  NON FORMULARY Strontium 950 1 tab po qd      . NON FORMULARY Silica 54mg  1 tab po qd      . predniSONE (DELTASONE) 50 MG tablet Take 1 tablet (50 mg total) by mouth daily.  5 tablet  0  . Progesterone 100 MG CAPS Take 1 tablet by mouth daily.      [name] venlafaxine (EFFEXOR) 37.5 MG tablet Take 37.5 mg by mouth at bedtime.      . vitamin C (ASCORBIC ACID) 500 MG tablet Take 500 mg by mouth daily.       No current facility-administered medications for this visit.    No Known Allergies  Past Medical History  Diagnosis Date  . MVP (mitral valve prolapse)   . Depression   . Orthostatic hypotension   . Peripheral neuropathy 06/09/2012  . Hypothyroidism   . Pneumothorax   . Asthma     Past Surgical History  Procedure Laterality Date  . Tonsillectomy    . Mouth surgery  01/2012    bone graft in mouth  . Colonoscopy with propofol N/A 02/22/2013    Procedure: COLONOSCOPY WITH PROPOFOL;  Surgeon: Charolett Bumpers, MD;  Location: [facility];  Service: Endoscopy;  Laterality: N/A;    History   Social History  . Marital  Status: Divorced    Spouse Name: N/A    Number of Children: 0  . Years of Education: N/A   Occupational History  .      Career and life coach   Social History Main Topics  . Smoking status: Never Smoker   . Smokeless tobacco: Never Used  . Alcohol Use: No  . Drug Use: No  . Sexual Activity: Not on file   Other Topics Concern  . Not on file   Social History Narrative  . No narrative on file    Family History  Problem Relation Age of Onset  . Hyperlipidemia Father   . Heart failure Father   . Prostate cancer Father   . Kidney failure Father   . Angina Mother   . Hypertension Mother   . Lung cancer Mother     ROS: no fevers or chills, productive cough, hemoptysis, dysphasia, odynophagia, melena, hematochezia, dysuria, hematuria, rash, seizure activity, orthopnea, PND, pedal edema, claudication. Remaining systems are negative.  Physical Exam:    Blood pressure 115/80, pulse 66, height 5\' 9"  (1.753 m), weight 131 lb (59.421 kg).  General:  Well developed/well nourished in NAD Skin warm/dry Patient not depressed No peripheral clubbing Back-normal HEENT-normal/normal eyelids Neck supple/normal carotid upstroke bilaterally; no bruits; no JVD; no thyromegaly chest - CTA/ normal expansion CV - RRR/normal S1 and S2; no murmurs, rubs or gallops;  PMI nondisplaced Abdomen -NT/ND, no HSM, no mass, + bowel sounds, no bruit 2+ femoral pulses, no bruits Ext-no edema, chords, 2+ DP Neuro-grossly nonfocal  ECG sinus rhythm, right axis deviation.

## 2013-08-01 NOTE — Assessment & Plan Note (Signed)
Patient not volume overloaded on examination. Recent chest x-ray negative. She did fly to [place] in September. Check d-dimer to screen for pulmonary embolus. Schedule echocardiogram to assess LV function.

## 2013-08-01 NOTE — Assessment & Plan Note (Signed)
Patient complains of some dizziness with standing. I have recommended increased by mouth fluid intake as well as sodium intake.

## 2013-08-01 NOTE — Assessment & Plan Note (Signed)
Repeat echocardiogram. 

## 2013-08-16 ENCOUNTER — Telehealth: Payer: Self-pay | Admitting: Neurology

## 2013-08-16 NOTE — Telephone Encounter (Signed)
Patient sched/ confirmed appt.

## 2013-08-16 NOTE — Telephone Encounter (Signed)
Patient very unhappy because her appt was R/S for march wants to be called back by a nurse today. Did not r/s appt because it was not acceptable to her

## 2013-08-16 NOTE — Telephone Encounter (Signed)
Left message for patient to call and reschedule 11/02/13 appointment per Dr. Oliva Bustard schedule.

## 2013-08-17 ENCOUNTER — Other Ambulatory Visit: Payer: Self-pay | Admitting: *Deleted

## 2013-08-17 ENCOUNTER — Ambulatory Visit (HOSPITAL_COMMUNITY): Payer: BC Managed Care – PPO | Attending: Cardiology | Admitting: Cardiology

## 2013-08-17 ENCOUNTER — Encounter: Payer: Self-pay | Admitting: Cardiology

## 2013-08-17 ENCOUNTER — Ambulatory Visit: Payer: BC Managed Care – PPO | Admitting: *Deleted

## 2013-08-17 DIAGNOSIS — R0609 Other forms of dyspnea: Secondary | ICD-10-CM | POA: Insufficient documentation

## 2013-08-17 DIAGNOSIS — R0602 Shortness of breath: Secondary | ICD-10-CM

## 2013-08-17 DIAGNOSIS — R9430 Abnormal result of cardiovascular function study, unspecified: Secondary | ICD-10-CM

## 2013-08-17 DIAGNOSIS — I059 Rheumatic mitral valve disease, unspecified: Secondary | ICD-10-CM

## 2013-08-17 DIAGNOSIS — G629 Polyneuropathy, unspecified: Secondary | ICD-10-CM

## 2013-08-17 DIAGNOSIS — I341 Nonrheumatic mitral (valve) prolapse: Secondary | ICD-10-CM

## 2013-08-17 DIAGNOSIS — R0989 Other specified symptoms and signs involving the circulatory and respiratory systems: Secondary | ICD-10-CM | POA: Insufficient documentation

## 2013-08-17 LAB — D-DIMER, QUANTITATIVE (NOT AT ARMC): D-Dimer, Quant: 0.33 ug/mL-FEU (ref 0.00–0.48)

## 2013-08-17 NOTE — Progress Notes (Signed)
Echo performed. 

## 2013-08-19 ENCOUNTER — Telehealth: Payer: Self-pay | Admitting: Cardiology

## 2013-08-19 NOTE — Telephone Encounter (Signed)
Spoke with pt, discussed with dr Jens Som, he would prefer the testing be done here. Patient voiced understanding

## 2013-08-19 NOTE — Telephone Encounter (Signed)
New problem    Patient is asking can she have her cardiac mri done in [place] . Since she will be there until 1/1.  Patient is asking for cardiac mri to be done this year. Advise patient that cardiac mri are scheduled into jan

## 2013-08-31 ENCOUNTER — Encounter: Payer: Self-pay | Admitting: Cardiology

## 2013-08-31 ENCOUNTER — Ambulatory Visit: Payer: BC Managed Care – PPO | Admitting: *Deleted

## 2013-09-01 ENCOUNTER — Other Ambulatory Visit: Payer: Self-pay | Admitting: Cardiology

## 2013-09-01 DIAGNOSIS — R9430 Abnormal result of cardiovascular function study, unspecified: Secondary | ICD-10-CM

## 2013-09-13 ENCOUNTER — Ambulatory Visit (HOSPITAL_COMMUNITY)
Admission: RE | Admit: 2013-09-13 | Discharge: 2013-09-13 | Disposition: A | Discharge status: Home / Self Care | Payer: BC Managed Care – PPO | Source: Ambulatory Visit | Attending: Cardiology | Admitting: Cardiology

## 2013-09-13 DIAGNOSIS — R9430 Abnormal result of cardiovascular function study, unspecified: Secondary | ICD-10-CM

## 2013-09-13 LAB — CREATININE, SERUM
Creatinine, Ser: 0.62 mg/dL (ref 0.50–1.10)
GFR calc Af Amer: >90 mL/min (ref >90)
GFR calc non Af Amer: >90 mL/min (ref >90)

## 2013-09-13 MED ORDER — GADOBENATE DIMEGLUMINE 529 MG/ML IV SOLN
20.0000 mL | Freq: Once | INTRAVENOUS | Status: AC
  Administered 2013-09-13: 20 mL via INTRAVENOUS

## 2013-10-23 ENCOUNTER — Encounter: Payer: Self-pay | Admitting: Oncology

## 2013-11-02 ENCOUNTER — Ambulatory Visit: Payer: Self-pay | Admitting: Neurology

## 2013-11-28 ENCOUNTER — Encounter: Payer: Self-pay | Admitting: *Deleted

## 2013-11-29 ENCOUNTER — Ambulatory Visit: Payer: Self-pay | Admitting: Neurology

## 2013-12-06 ENCOUNTER — Encounter: Payer: Self-pay | Admitting: Neurology

## 2013-12-06 ENCOUNTER — Ambulatory Visit (INDEPENDENT_AMBULATORY_CARE_PROVIDER_SITE_OTHER): Payer: BC Managed Care – PPO | Admitting: Neurology

## 2013-12-06 VITALS — BP 90/60 | HR 63 | Wt 129.0 lb

## 2013-12-06 DIAGNOSIS — G609 Hereditary and idiopathic neuropathy, unspecified: Secondary | ICD-10-CM

## 2013-12-06 LAB — FOLATE: Folate: >20 ng/mL

## 2013-12-06 LAB — VITAMIN B12: Vitamin B-12: 932 pg/mL — ABNORMAL HIGH (ref 211–911)

## 2013-12-06 LAB — C-REACTIVE PROTEIN: CRP: <0.5 mg/dL (ref <0.60)

## 2013-12-06 NOTE — Progress Notes (Signed)
[facility] Clinic Note - Initial Visit   Date: 12/06/2013    Megan [name] MRN: [id number] DOB: 1956-06-10   Dear Dr Laurann Montana:  Thank you for your kind referral of Megan [name] for consultation of neuropathy. Although her history is well known to you, please allow [place] to reiterate it for the purpose of our medical record. The patient was accompanied to the clinic by self.   History of Present Illness: Megan [name] is a 58 y.o. right-handed Caucasian female with history of osteoporosis, leukopenia, asthma, peripheral neuropathy of unknown etiology presenting for evaluation of second opinion for her ongoing bilateral feet pain.    She moved to [place] to help her parents in 2009. Of note, she reports finding out that the home she moved into there was a lot of mold and had to get the home In November 2009, she developed severe pneumonia and collapsed lung and was hospitalized at [facility].  She was not intubated during the stay.  She was told she had adult onset asthma and contacted a holistic doctor who was able to wean her off inhalers.  She was doing well until 2012, when she developed bilateral feet swelling and burning pain.  She went to see Sports Medicine, Podiatry, and PT for possible plantar fascitiis.  It improved transiently, but later that year, she developed cramping of the feet and discolaration.  She even went to the Emergency Department on one occasion due due severe cyanosis of the feet and cold sensation.  She was told to follow-up with her PCP who ordered lab testing and referred her to see [facility] for NCS/EMG which showed mild axonal peripheral neuropathy.  She was being followed by Dr. Maureen Chatters who ordered additional labs and imaging of the brain, cervical and lumbar spine.  Due to white matter changes seen on the brain and possibility of underlying vasculitis, she was given a two week trial of prednisone which did not change her  symptoms.  Labs were unrevealing (see below).  Ultrasound of the lower extremity did not reveal any thrombus.  Her numbness and loss of balance has worsened over the years, but burning pain involving the soles of the feet has remained relatively unchanged.  She also reports weakness of the legs.  She was previously very active and went to the gym 7-days per week, but now goes 4-5 days per week with lighter routines because she is unable to lift as much and has problems with her balance.  She sought a second opinion at [facility] and saw Dr. Elias Else in 2013.  She underwent a second NCS/EMG which is interpreted as normal, although she was told she has neuropathy. Due to financial reasons, she did not followup at [facility] again.  Autoimmune, inflammatory labs, as well as paraneoplastic panel was negative.  In October 2014, she started seeing Dr. Berdine Addison, neurologist, at [facility] who repeated NCS/EMG which was interpreted as sensorimotor peripheral neuropathy with axonal and demyelinating features.    From a symptomatic standpoint, she has been offered neuralgesic medications such as gabapentin, but  she prefers to be on as little medication as possible so is currently not taking anything. Denies dry eyes, dry mouth, anhydrosis, or early satiety.  She endorses light headedness.  She was followed a vegan diet from 2013-2014 and recently added fish, chicken, and eggs to diet. She does not eat very much dairy and avoids gluten-products.    Past Medical History  Diagnosis Date  . MVP (mitral valve prolapse)   . Depression   . Orthostatic hypotension   . Peripheral neuropathy 06/09/2012  . Hypothyroidism   . Pneumothorax   . Asthma   . Osteoporosis   . Plantar fasciitis     Past Surgical History  Procedure Laterality Date  . Tonsillectomy  1962  . Mouth surgery  01/2012    bone graft in mouth  . Colonoscopy with propofol N/A 02/22/2013    Procedure:  COLONOSCOPY WITH PROPOFOL;  Surgeon: Garlan Fair, MD;  Location: [facility];  Service: Endoscopy;  Laterality: N/A;  . Root canal  02/27/2012     Medications:  Current Outpatient Prescriptions on File Prior to Visit  Medication Sig Dispense Refill  . acidophilus (RISAQUAD) CAPS Take 1 capsule by mouth 2 (two) times daily.       [name] CALCIUM PO Take 1 tablet by mouth daily.      . cholecalciferol (VITAMIN D) 1000 UNITS tablet Take 5,000 Units by mouth daily.       . CYANOCOBALAMIN PO Take 1 tablet by mouth daily.      . Digestive Enzymes (DIGESTIVE ENZYME PO) Take 1 tablet by mouth 3 (three) times daily.      [name] estradiol (VIVELLE-DOT) 0.0375 MG/24HR Place 1 patch onto the skin 2 (two) times a week. 1/2 patch      . levothyroxine (SYNTHROID, LEVOTHROID) 25 MCG tablet Take 25 mcg by mouth daily before breakfast.       . MAGNESIUM PO Take 1 tablet by mouth daily.      . Multiple Vitamin (MULTIVITAMIN WITH MINERALS) TABS Take 1 tablet by mouth daily.      . Multiple Vitamins-Iron (CHLORELLA PO) Take 1 tablet by mouth 2 (two) times daily.      . Niacin (VITAMIN B-3 PO) Take 1 tablet by mouth daily.      . NON FORMULARY Strontium 950 1 tab po qd      . NON FORMULARY Silica 95JO 1 tab po qd      . Progesterone 100 MG CAPS Take 0.25 tablets by mouth daily.       [name] venlafaxine (EFFEXOR) 37.5 MG tablet Take 37.5 mg by mouth at bedtime.      . vitamin C (ASCORBIC ACID) 500 MG tablet Take 500 mg by mouth daily.       No current facility-administered medications on file prior to visit.    Allergies: No Known Allergies  Family History: Family History  Problem Relation Age of Onset  . Hyperlipidemia Father   . Heart failure Father   . Prostate cancer Father   . Kidney failure Father   . Angina Mother   . Hypertension Mother   . Lung cancer Mother   . Lung cancer Paternal Grandfather   . Breast cancer Paternal Grandmother   . Diabetes Paternal Grandmother   . Hypertension Father   .  Colon cancer Mother     Social History: History   Social History  . Marital Status: Divorced    Spouse Name: N/A    Number of Children: 0  . Years of Education: N/A   Occupational History  .      Career and life coach   Social History Main Topics  . Smoking status: Never Smoker   . Smokeless tobacco: Never Used  . Alcohol Use: No  . Drug Use: No  . Sexual Activity: Not on file   Other Topics Concern  . Not  on file   Social History Narrative   Patient is single and lives alone.   Patient is self-employed, career and life coaching.   Patient drinks two to four cups of caffeine daily.    Review of Systems:  CONSTITUTIONAL: No fevers, chills, night sweats, or weight loss.   EYES: No visual changes or eye pain ENT: No hearing changes.  No history of nose bleeds.   RESPIRATORY: No cough, wheezing and shortness of breath.   CARDIOVASCULAR: Negative for chest pain, and palpitations.   GI: Negative for abdominal discomfort, blood in stools or black stools.  No recent change in bowel habits.   GU:  No history of incontinence.   MUSCLOSKELETAL: + history of joint pain or swelling.  No myalgias.   SKIN: Negative for lesions, rash, and itching.   HEMATOLOGY/ONCOLOGY: Negative for prolonged bleeding, bruising easily, and swollen nodes.  No history of cancer.   ENDOCRINE: + cold no  heat intolerance, polydipsia or goiter.   PSYCH:  No depression or anxiety symptoms.   NEURO: As Above.   Vital Signs:  BP 90/60  Pulse 63  Wt 129 lb (58.514 kg)  SpO2 99%  General Medical Exam:   General:  Well appearing, slim, comfortable.   Eyes/ENT: see cranial nerve examination. Slightly elongated face.     Back:  No pain to palpation of spinous processes.   Extremities:  No deformities, edema, or skin discoloration. Good capillary refill.   Skin:  Mild purple hue to the soles of her feet and the toes are cool to touch.  Pedal pulses are strong bilaterally.    Neurological Exam: MENTAL  STATUS including orientation to time, place, person, recent and remote memory, attention span and concentration, language, and fund of knowledge is normal.  Speech is not dysarthric.  CRANIAL NERVES: II:  No visual field defects.  Unremarkable fundi.   III-IV-VI: Pupils equal round and reactive to light.  Normal conjugate, extra-ocular eye movements in all directions of gaze.  No nystagmus.  No ptosis.   V:  Normal facial sensation.    VII:  Normal facial symmetry and movements.  No pathologic facial reflexes.  VIII:  Normal hearing and vestibular function.   IX-X:  Normal palatal movement.   XI:  Normal shoulder shrug and head rotation.   XII:  Normal tongue strength and range of motion, no deviation or fasciculation.  MOTOR:  No atrophy, fasciculations or abnormal movements.  No pronator drift.  Tone is normal.    Right Upper Extremity:    Left Upper Extremity:    Deltoid  5/5   Deltoid  5/5   Biceps  5/5   Biceps  5/5   Triceps  5/5   Triceps  5/5   Wrist extensors  5/5   Wrist extensors  5/5   Wrist flexors  5/5   Wrist flexors  5/5   Finger extensors  5/5   Finger extensors  5/5   Finger flexors  5/5   Finger flexors  5/5   Dorsal interossei  5/5   Dorsal interossei  5/5   Abductor pollicis  5/5   Abductor pollicis  5/5   Tone (Ashworth scale)  0  Tone (Ashworth scale)  0   Right Lower Extremity:    Left Lower Extremity:    Hip flexors  5/5   Hip flexors  5/5   Hip extensors  5/5   Hip extensors  5/5   Knee flexors  5/5   Knee  flexors  5/5   Knee extensors  5/5   Knee extensors  5/5   Dorsiflexors  5/5   Dorsiflexors  5/5   Plantarflexors  5/5   Plantarflexors  5/5   Toe extensors  5/5   Toe extensors  5/5   Toe flexors  5/5   Toe flexors  5/5   Tone (Ashworth scale)  0  Tone (Ashworth scale)  0   MSRs:  Right                                                                 Left brachioradialis 2+  brachioradialis 2+  biceps 2+  biceps 2+  triceps 2+  triceps 2+    patellar 2+  patellar 2+  ankle jerk 2+  ankle jerk 2+  Hoffman no  Hoffman no  plantar response down  plantar response down    SENSORY:  Vibration reduced ~20% distal to ankles bilaterally.  Otherwise, normal and symmetric perception of light touch, pinprick, and proprioception.  Romberg's sign positive.   COORDINATION/GAIT: Normal finger-to- nose-finger and heel-to-shin.  Intact rapid alternating movements bilaterally.  Able to rise from a chair without using arms.  Gait narrow based and stable. Mild unsteadiness with tandem gait.  Stressed gait intact.  DATA: Labs 10/25/2013: Sodium 143, potassium 4.2, calcium 9.5, AST 23, ALT 8, free T4 0.98, antithyroglobulin antibody negative, CRP 0.24, TPO antibody negative Labs 09/14/2012:  Lead 1.7 Labs 2013: cryoglobulin neg, ANA negative Copper 116, ANCA negative especially, C3 101, C4 25, VDRL negative, paraneoplastic autoantibody negative, UPEP no M protein,  CMV, EBV, HIV, hep acute panel  MRI brain 01/29/2012:  Mild nonspecific white matter hyperintensities in the subcortical regions. 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 MRI cervical spine with and without contrast 01/29/2012:  Prominent spondylitic changes at C5-6 and C6-7 without significant compression. MRI of the lumbar spine without contrast 03/18/2012: Mild disc degenerative changes from L3-S1 with severe left-sided foraminal narrowing at L3-4 and L4-5 without definite root impingement  EMG performed at [facility] 10/15/2011: Mild distal sensory axonal neuropathy. EMG performed at [facility]:  Normal study. No evidence of large fiber sensory motor neuropathy. EMG performed at [facility] bilateral lower extremity 06/02/2013:  Significant sensorimotor axonal and demyelinating peripheral neuropathy. Upon my review, sensory and motor amplitudes are preserved the latency is normal.   Late responses are prolonged (F-wave) and conduction velocity is borderline slow 38-42 m/s.  Temperature limb was not recorded.  IMPRESSION: Ms. Stokke is a 58 year old female presenting for evaluation of peripheral neuropathy. Her examination does show evidence of any mild distal and symmetric small and large fiber neuropathy. Regarding her skin discoloration and cold temperature of her toes, she may have either Raynaud's phenomena or reflex sympathetic dystrophy.  Dorsalis pedis pulse is strong bilaterally.   Previous evaluation has included several EMGs, some showing evidence of neuropathy and others which are normal. She is also underwent extensive laboratory testing including CRP, ANA, and color, C3, C4, VDRL, cryoglobulin, TPO antibody, thyroglobulin antibody, CMV, EBV, HIV, hepatitis panel, UPEP, and several paraneoplastic antibody panels which have been nondiagnostic.  Imaging of the brain showed nonspecific white matter changes, I am unable to  review images personally as this CDs provided did not open.  I would like to repeat her EMG to determine if there is indeed neuropathy with demyelinating features. If there is evidence of demyelination, I would recommend additional testing including a lumbar puncture and serology testing. My suspicion for vasculitic neuropathy such as mononeuritis multiplex is low, since this is generally aggressive and manifests in a length independent pattern, usually evolving multiple motor and sensory peripheral nerves.    I further explained that in most cases of neuropathy even when we look for potential treatable causes, idiopathic peripheral neuropathy is most common. There are additional laboratory tests that I would like to check for including nutritional deficiencies and diabetes. I offered her a trial of medication for symptomatic relief, however she is comfortable off medications at this time.   PLAN/RECOMMENDATIONS:  1.  Check 2-hr glucose tolerance test,  vitamin B12, vitamin B1, vitamin E, vitamin B6, zinc, copper, MMA, folate, ESR, CRP, SPEP/UPEP with IFE, ENA, ANA 2.  EMG of the right side 3.  Fall precautions discussed at length  4.  Return to clinic in 6 weeks   The duration of this appointment visit was 65 minutes of face-to-face time with the patient.  Greater than 50% of this time was spent in counseling, explanation of diagnosis, planning of further management, and coordination of care.   Thank you for allowing me to participate in patient's care.  If I can answer any additional questions, I would be pleased to do so.    Sincerely,    Donika K. Posey Pronto, DO

## 2013-12-06 NOTE — Patient Instructions (Signed)
1.  EMG of the right side 2.  Check labs 3.  Return to clinic in 6 weeks

## 2013-12-07 LAB — ENA 9 PANEL
Centromere Ab Screen: <1
ENA SM Ab Ser-aCnc: <1
Jo-1 Antibody, IgG: <1
Ribosomal P Protein Ab: <1
SM/RNP: <1
SSA (Ro) (ENA) Antibody, IgG: <1
SSB (La) (ENA) Antibody, IgG: <1
Scleroderma (Scl-70) (ENA) Antibody, IgG: <1
ds DNA Ab: 2 IU/mL

## 2013-12-07 LAB — SEDIMENTATION RATE

## 2013-12-07 LAB — ANA: Anti Nuclear Antibody(ANA): POSITIVE — AB

## 2013-12-07 LAB — ANTI-NUCLEAR AB-TITER (ANA TITER): ANA Titer 1: NEGATIVE

## 2013-12-07 NOTE — Progress Notes (Signed)
Note faxed.

## 2013-12-08 LAB — SPEP & IFE WITH QIG
Albumin ELP: 65.9 % (ref 55.8–66.1)
Alpha-1-Globulin: 3.7 % (ref 2.9–4.9)
Alpha-2-Globulin: 9.1 % (ref 7.1–11.8)
Beta 2: 3.6 % (ref 3.2–6.5)
Beta Globulin: 7 % (ref 4.7–7.2)
Gamma Globulin: 10.7 % — ABNORMAL LOW (ref 11.1–18.8)
IgA: 127 mg/dL (ref 69–380)
IgG (Immunoglobin G), Serum: 675 mg/dL — ABNORMAL LOW (ref 690–1700)
IgM, Serum: 98 mg/dL (ref 52–322)
Total Protein, Serum Electrophoresis: 6.4 g/dL (ref 6.0–8.3)

## 2013-12-08 LAB — UIFE/LIGHT CHAINS/TP QN, 24-HR UR
Albumin, U: DETECTED
Alpha 1, Urine: DETECTED — AB
Alpha 2, Urine: DETECTED — AB
Beta, Urine: DETECTED — AB
Free Kappa Lt Chains,Ur: 0.93 mg/dL (ref 0.14–2.42)
Free Kappa/Lambda Ratio: 8.45 ratio (ref 2.04–10.37)
Free Lambda Lt Chains,Ur: 0.11 mg/dL (ref 0.02–0.67)
Gamma Globulin, Urine: DETECTED — AB
Total Protein, Urine: 1.5 mg/dL

## 2013-12-08 LAB — COPPER, SERUM: Copper: 124 ug/dL (ref 70–175)

## 2013-12-09 LAB — VITAMIN B6: Vitamin B6: 29.9 ng/mL — ABNORMAL HIGH (ref 2.1–21.7)

## 2013-12-09 LAB — VITAMIN E
Gamma-Tocopherol (Vit E): 0.7 mg/L (ref <=4.3)
Vitamin E (Alpha Tocopherol): 14.6 mg/L (ref 5.7–19.9)

## 2013-12-09 LAB — ZINC: Zinc: 87 ug/dL (ref 60–130)

## 2013-12-10 LAB — METHYLMALONIC ACID, SERUM: Methylmalonic Acid, Quant: 64 nmol/L — ABNORMAL LOW (ref 87–318)

## 2013-12-10 LAB — VITAMIN B1: Vitamin B1 (Thiamine): 32 nmol/L — ABNORMAL HIGH (ref 8–30)

## 2013-12-12 ENCOUNTER — Telehealth: Payer: Self-pay | Admitting: Neurology

## 2013-12-12 NOTE — Telephone Encounter (Signed)
I attempted to contact patient via phone today regarding the results of lab tests, however there was no answer so a message was left for the patient to return my call.   Megan K. Posey Pronto, DO

## 2013-12-13 ENCOUNTER — Telehealth: Payer: Self-pay | Admitting: Neurology

## 2013-12-13 DIAGNOSIS — D472 Monoclonal gammopathy: Secondary | ICD-10-CM

## 2013-12-13 NOTE — Telephone Encounter (Signed)
Returning your call please call [phone]

## 2013-12-13 NOTE — Telephone Encounter (Signed)
Dr. Posey Pronto calling patient back.

## 2013-12-13 NOTE — Telephone Encounter (Signed)
Returned call to patient and discussed labs.  Informed her that UPEP showed lamba free protein and I would recommend hematology evaluation.  Explained that this may be associated with neuropathy, systemic inflammation, or insignificant finding.  She was previously seeing Dr. Beryle Beams for leukopenia.  Megan [name] K. Posey Pronto, DO

## 2013-12-13 NOTE — Telephone Encounter (Signed)
Dr. Posey Pronto is calling patient back.

## 2013-12-13 NOTE — Telephone Encounter (Signed)
Referral form faxed to cancer center.  Pt aware they will be calling her for an appt.

## 2013-12-26 ENCOUNTER — Ambulatory Visit (INDEPENDENT_AMBULATORY_CARE_PROVIDER_SITE_OTHER): Payer: BC Managed Care – PPO | Admitting: Oncology

## 2013-12-26 ENCOUNTER — Encounter: Payer: Self-pay | Admitting: Oncology

## 2013-12-26 VITALS — BP 103/69 | HR 59 | Temp 96.5°F | Resp 20 | Ht 69.0 in | Wt 127.7 lb

## 2013-12-26 DIAGNOSIS — G63 Polyneuropathy in diseases classified elsewhere: Secondary | ICD-10-CM

## 2013-12-26 DIAGNOSIS — D472 Monoclonal gammopathy: Secondary | ICD-10-CM | POA: Insufficient documentation

## 2013-12-26 DIAGNOSIS — D72819 Decreased white blood cell count, unspecified: Secondary | ICD-10-CM

## 2013-12-26 DIAGNOSIS — G603 Idiopathic progressive neuropathy: Secondary | ICD-10-CM

## 2013-12-26 DIAGNOSIS — G629 Polyneuropathy, unspecified: Secondary | ICD-10-CM

## 2013-12-26 LAB — COMPREHENSIVE METABOLIC PANEL
ALT: 9 U/L (ref 0–35)
AST: 22 U/L (ref 0–37)
Albumin: 4.8 g/dL (ref 3.5–5.2)
Alkaline Phosphatase: 69 U/L (ref 39–117)
BUN: 9 mg/dL (ref 6–23)
CO2: 27 mEq/L (ref 19–32)
Calcium: 9.8 mg/dL (ref 8.4–10.5)
Chloride: 104 mEq/L (ref 96–112)
Creat: 0.59 mg/dL (ref 0.50–1.10)
Glucose, Bld: 86 mg/dL (ref 70–99)
Potassium: 4.2 mEq/L (ref 3.5–5.3)
Sodium: 141 mEq/L (ref 135–145)
Total Bilirubin: 0.5 mg/dL (ref 0.2–1.2)
Total Protein: 7 g/dL (ref 6.0–8.3)

## 2013-12-26 LAB — CBC WITH DIFFERENTIAL/PLATELET
Basophils Absolute: 0 10*3/uL (ref 0.0–0.1)
Basophils Relative: 1 % (ref 0–1)
Eosinophils Absolute: 0 10*3/uL (ref 0.0–0.7)
Eosinophils Relative: 1 % (ref 0–5)
HCT: 41.1 % (ref 36.0–46.0)
Hemoglobin: 14.3 g/dL (ref 12.0–15.0)
Lymphocytes Relative: 30 % (ref 12–46)
Lymphs Abs: 0.8 10*3/uL (ref 0.7–4.0)
MCH: 30.2 pg (ref 26.0–34.0)
MCHC: 34.8 g/dL (ref 30.0–36.0)
MCV: 86.9 fL (ref 78.0–100.0)
Monocytes Absolute: 0.2 10*3/uL (ref 0.1–1.0)
Monocytes Relative: 8 % (ref 3–12)
Neutro Abs: 1.6 10*3/uL — ABNORMAL LOW (ref 1.7–7.7)
Neutrophils Relative %: 60 % (ref 43–77)
Platelets: 218 10*3/uL (ref 150–400)
RBC: 4.73 MIL/uL (ref 3.87–5.11)
RDW: 13.9 % (ref 11.5–15.5)
WBC: 2.6 10*3/uL — ABNORMAL LOW (ref 4.0–10.5)

## 2013-12-26 LAB — SAVE SMEAR

## 2013-12-26 NOTE — Patient Instructions (Signed)
To lab today 24 hour urine collection for protein and antibodies Return visit with Dr Beryle Beams  30 minutes 11:15 June 2

## 2013-12-26 NOTE — Progress Notes (Signed)
Patient ID: Megan [name], female   DOB: 11-20-1955, 58 y.o.   MRN: [id number] Hematology and Oncology Follow Up Visit  Megan [name] [id number] May 08, 1956 58 y.o. 12/26/2013 1:53 PM   Principle Diagnosis: Encounter Diagnoses  Name Primary?  . Monoclonal gammopathy of undetermined significance Yes  . Leukopenia   . Peripheral neuropathy   . Neuropathy associated with MGUS      Interim History:   Followup visit for this 58 year old woman I initially evaluated her here in October 2013 for unexplained, mild, chronic, neutropenia with a normal differential count and without any associated anemia or thrombocytopenia. She had no history of recurrent infections. She did report idiopathic neuropathy and atypical intermittent cyanotic changes of the soles of her feet. There were no signs or symptoms to suggest that she had vasculitis. ESR 1 mm. Peripheral pulses all strong and symmetric. Myeloma screening in both serum and urine was negative. Cryoglobulin screen negative. Hepatitis A, B., C., HIV, EBV all negative. There was possible lead exposure. Plasma lead levels and free erythrocye protoporphyrin levels were in normal range.  She has had a vascular surgery evaluation with both venous and arterial Doppler studies which were normal.  She did get some symptom relief from a Vegan diet. She returns now after a second opinion neurology consultation was done with Dr. Narda Amber last month. She repeated a number of studies. ANA weakly positive but quantitative level less than 1:40. Serum protein electrophoresis with an immunofixation electrophoresis was unremarkable. However, a spot urine done on 12/07/2013 now positive for monoclonal  Lambda immunoglobulin. Since her last visit with me in July of 2014, her neuropathy has gotten worse. She is not having difficulty writing, she is having more episodes of orthostatic hypotension, more problems with balance. She continues to have intermittent  cyanotic changes primarily on the soles of her feet. She has not noticed any changes in her hands. She did see an alternative medicine practitioner. A panel of exotic laboratory tests were done under the name of a paraneoplastic evaluation and to tests were inconclusive: Anti LGL1 antibodies and anti CASPR2 antibodies. I told the patient that I did not know how to interpret these tests as they are not standard and in my experiences as an oncologist, if these tests were valid as signs of paraneoplastic syndromes, more oncologist would be using them.  Practically speaking, review of systems today did not reveal any signs and symptoms that would be suspicious for a metastatic carcinoma. She is up-to-date on her health maintenance exams including mammograms and a Pap smear within the last 12 months. Her weight has been stable. Appetite is good. She has some localized jaw pain on the left which has been there on and off for the last 2 years since a dental implant with a bone graft was done. She states x-rays of this area by her dentist have not shown any obvious bone pathology. No other areas of muscle bone or joint pain. She does not have any epistaxis, hematuria, hematochezia or melena, or vaginal bleeding. She has not had any change in her bowel habit. She was having some dyspnea on exertion. Electrocardiogram and echocardiogram were normal. (December 2014). A cardiac MRI normal 09/13/2013.  Medications: reviewed  Allergies: No Known Allergies  Review of Systems: Hematology:  See above ENT ROS: No sore throat  Breast ROS: See above Respiratory ROS: No cough or dyspnea at rest. See above Cardiovascular ROS: No ischemic type chest pain or palpitations.  Gastrointestinal ROS: See above  Genito-Urinary ROS: See above Musculoskeletal ROS: See above Neurological ROS: See above . No headache or change in vision Dermatological ROS: See above Remaining ROS negative: No unusual rashes or ecchymosis other  than noted above  Physical Exam: Blood pressure 103/69, pulse 59, temperature 96.5 F (35.8 C), temperature source Oral, resp. rate 20, height '5\' 9"'  (1.753 m), weight 127 lb 11.2 oz (57.924 kg), SpO2 100.00%. Wt Readings from Last 3 Encounters:  12/26/13 127 lb 11.2 oz (57.924 kg)  12/06/13 129 lb (58.514 kg)  08/01/13 131 lb (59.421 kg)     General appearance: Tall, thin, Caucasian woman HENNT: Pharynx no erythema, exudate, mass, or ulcer. No thyromegaly or thyroid nodules Lymph nodes: No cervical, supraclavicular, or axillary lymphadenopathy Breasts:  Lungs: Clear to auscultation, resonant to percussion throughout Heart: Regular rhythm, no murmur, no gallop, no rub, no click, no edema Abdomen: Soft, nontender, normal bowel sounds, no mass, no organomegaly Extremities: No edema, no calf tenderness Musculoskeletal: no joint deformities GU:  Vascular: Carotid pulses 2+, no bruits, distal pulses: Radial pulses 1+ symmetric, ulnar pulses not palpable but symmetric. Dorsalis pedis 2+ symmetric, posterior tibial pulses not palpable but symmetric, Advanced Cyanotic changes covering the soles of her feet and extending to the toes. Milder cyanotic changes of her fingertips. Neurologic: Alert, oriented, PERRLA, optic discs sharp and vessels normal, no hemorrhage or exudate, cranial nerves grossly normal, motor strength 5 over 5, reflexes 1+ symmetric, upper body coordination normal, gait normal, vibration sensation mildly decreased over the fingertips but are truly absent over the feet by tuning fork exam. Skin: No rash or ecchymosis  Lab Results: Pending CBC W/Diff    Component Value Date/Time   WBC 3.4* 06/15/2013 0036   WBC 3.0* 59/22/2014 1144   RBC 4.69 06/15/2013 0036   RBC 4.59 03/15/2013 1144   HGB 14.2 06/15/2013 0036   HGB 13.7 03/15/2013 1144   HCT 41.4 06/15/2013 0036   HCT 40.8 03/15/2013 1144   PLT 210 06/15/2013 0036   PLT 210 03/15/2013 1144   MCV 88.3 06/15/2013 0036   MCV  88.9 03/15/2013 1144   MCH 30.3 06/15/2013 0036   MCH 29.9 03/15/2013 1144   MCHC 34.3 06/15/2013 0036   MCHC 33.6 03/15/2013 1144   RDW 12.5 06/15/2013 0036   RDW 13.8 03/15/2013 1144   LYMPHSABS 1.0 03/15/2013 1144   LYMPHSABS 1.1 05/21/2012 0104   MONOABS 0.3 03/15/2013 1144   MONOABS 0.3 05/21/2012 0104   EOSABS 0.0 03/15/2013 1144   EOSABS 0.1 05/21/2012 0104   BASOSABS 0.1 03/15/2013 1144   BASOSABS 0.0 05/21/2012 0104     Chemistry      Component Value Date/Time   NA 140 06/15/2013 0036   NA 140 05/24/2012 1053   K 3.7 06/15/2013 0036   K 4.1 05/24/2012 1053   CL 101 06/15/2013 0036   CL 106 05/24/2012 1053   CO2 29 06/15/2013 0036   CO2 27 05/24/2012 1053   BUN 15 06/15/2013 0036   BUN 13.0 05/24/2012 1053   CREATININE 0.62 09/13/2013 1030   CREATININE 0.7 05/24/2012 1053      Component Value Date/Time   CALCIUM 9.6 06/15/2013 0036   CALCIUM 9.5 05/24/2012 1053   ALKPHOS 65 05/24/2012 1053   ALKPHOS 74 05/21/2012 0104   AST 29 05/24/2012 1053   AST 30 05/21/2012 0104   ALT 13 05/24/2012 1053   ALT 13 05/21/2012 0104   BILITOT 0.50 05/24/2012 1053   BILITOT 0.2* 05/21/2012 0104  Impression:  #1. Idiopathic progressive neuropathy and mild leukopenia with normal differential but no anemia. New findings of monoclonal lambda protein in the urine on a spot urine specimen Persistent unexplained cyanotic changes of the distal extremities feet greater than hands. Previous negative cryoglobulin screen.  I'm going to repeat the cryoglobulin screen. I will obtain a 24 hour urine to see how much protein she is actually spilling and to confirm that it is monoclonal. I am repeating a chemistry profile as well to make sure that her renal function remains normal. We discussed potential diagnoses related to monoclonal proteinuria at length. Differential is quite large and includes myeloma, amyloidosis, lymphoma, vasculitis, other lymphoproliferative disorders, and collagen vascular  disorders. Further evaluation will be based on results above. We discussed possibility that she would need a bone marrow biopsy and, or,  a kidney biopsy if she has significant monoclonal proteinuria.  Over one hour spent with this patient with Hassell Done 50% in counseling and coordination of care.   CC: Patient Care Team: Irven Shelling, MD as PCP - General   Annia Belt, MD 5/4/20151:53 PM

## 2013-12-27 ENCOUNTER — Other Ambulatory Visit: Payer: BC Managed Care – PPO

## 2013-12-27 DIAGNOSIS — G609 Hereditary and idiopathic neuropathy, unspecified: Secondary | ICD-10-CM

## 2013-12-27 LAB — GLUCOSE TOLERANCE, 2 HOURS
Glucose, 1 Hour GTT: 82 mg/dL
Glucose, 2 hour: 77 mg/dL
Glucose, Fasting: 94 mg/dL (ref 70–99)

## 2013-12-27 LAB — KAPPA/LAMBDA LIGHT CHAINS
Kappa free light chain: 0.21 mg/dL — ABNORMAL LOW (ref 0.33–1.94)
Kappa:Lambda Ratio: 1.31 (ref 0.26–1.65)
Lambda Free Lght Chn: 0.16 mg/dL — ABNORMAL LOW (ref 0.57–2.63)

## 2013-12-29 ENCOUNTER — Other Ambulatory Visit: Payer: BC Managed Care – PPO

## 2013-12-29 DIAGNOSIS — D472 Monoclonal gammopathy: Secondary | ICD-10-CM

## 2013-12-29 LAB — PROTEIN, URINE, 24 HOUR: Protein, Urine: <3 mg/dL

## 2013-12-30 ENCOUNTER — Other Ambulatory Visit: Payer: Self-pay | Admitting: Oncology

## 2013-12-31 LAB — CRYOGLOBULIN

## 2014-01-03 LAB — UPEP/TP, 24-HR URINE: Total Protein, Urine: <3 mg/dL

## 2014-01-03 LAB — IMMUNOFIXATION INTE

## 2014-01-04 ENCOUNTER — Encounter: Payer: Self-pay | Admitting: Oncology

## 2014-01-04 NOTE — Progress Notes (Unsigned)
Patient called to review recent test results. A spot urine done through another office reported presence of monoclonal lambda  Protein on 12/06/13. My previous evaluation of serum and urine in 2013 was negative.  Serum immunoglobulins with immunofixation electrophoresis on 12/06/13, serum free kappa and lambda light chains 12/26/13, : both were normal. I obtained a 24 hour urine for total protein and IFE on 12/28/13: there was less than 3 mg of total protein in the 24 hour collection and no monoclonal proteins on IFE. Repeat cryoglobulin screen negative. There is no evidence that she has a monoclonal gammopathy to explain her neuropathy. We discussed doing a bone marrow biopsy to evaluate chronic leukopenia. She is not anemic or thrombocytopenic.  She has had the neutropenia for at least 6 years.  Bone marrow will be low yield for additional pathology but she will consider it and we will discuss at her visit with me next month.

## 2014-01-05 ENCOUNTER — Encounter: Payer: Self-pay | Admitting: Neurology

## 2014-01-20 ENCOUNTER — Other Ambulatory Visit: Payer: Self-pay | Admitting: *Deleted

## 2014-01-20 DIAGNOSIS — G609 Hereditary and idiopathic neuropathy, unspecified: Secondary | ICD-10-CM

## 2014-01-24 ENCOUNTER — Encounter: Payer: Self-pay | Admitting: Oncology

## 2014-01-24 ENCOUNTER — Ambulatory Visit (INDEPENDENT_AMBULATORY_CARE_PROVIDER_SITE_OTHER): Payer: BC Managed Care – PPO | Admitting: Oncology

## 2014-01-24 VITALS — BP 103/67 | HR 61 | Temp 97.1°F | Resp 20 | Ht 68.75 in | Wt 128.8 lb

## 2014-01-24 DIAGNOSIS — D72819 Decreased white blood cell count, unspecified: Secondary | ICD-10-CM

## 2014-01-24 NOTE — Progress Notes (Signed)
Patient ID: Megan [name], female   DOB: 18-Nov-1955, 58 y.o.   MRN: [id number] Followup visit for this pleasant 58 year old woman with idiopathic progressive peripheral neuropathy. I was recently asked to reevaluate her when a spot urine on 12/08/2011 tested positive for monoclonal lambda light chains. Previous evaluation of both serum and urine in 2013 at time of my initial evaluation was negative. Most recent repeat serum immunoglobulins with immunofixation electrophoresis on 12/06/2013 was normal. Please see my office followup note dated 12/27/2013 for further details of her recent and past medical history.  I obtained a 24 hour urine for total protein and IFE on 12/28/13: there was less than 3 mg of total protein in the 24 hour collection and no monoclonal proteins on IFE.  Repeat cryoglobulin screen negative.  There is no evidence that she has a monoclonal gammopathy to explain her neuropathy.   We discussed doing a bone marrow biopsy to evaluate chronic leukopenia.  She is not anemic or thrombocytopenic. She has had the neutropenia for at least 6 years. We reviewed her recent and past evaluations again today. She has had extensive laboratory testing to exclude C58 and folic acid deficiency, heavy metal toxicity, collagen vascular disease, vasculitis, other exotic causes of neuropathy such as Wilson's disease, lead poisoning, common virus exposure including EBV and CMV, hepatitis A, B., C., HIV and all of these have been unrevealing. Her erythrocyte sedimentation rate if anything is low with most recent recorded value of 1 mm.  We spent some time discussing her chronic, also idiopathic, leukopenia. As an isolated finding which has been chronic and not associated with any recurrent infections and in view of the extensive evaluation  above, I feel that a bone marrow biopsy would be very low yield with respect to any disorder that might be related to her neuropathy. We would not get any additional  information as to whether this was an immune process or not by examining the bone marrow. If we did a bone marrow biopsy it would only be to say that we did it to be complete. It is possible that we are seeing some isolated bone marrow toxicity from antidepressant drugs which she has been on since her 6s. I would consider stopping or substituting with a drug in a different class.  She is to have followup nerve conduction studies by her neurologist who she sees next week. Sometimes a sural nerve biopsy is helpful but I have no experience and we deferred any consideration of this to her neurologist. I scheduled her for a six-month followup visit. I told her I am not opposed to doing a bone marrow biopsy if she wants me to do this as long as she is aware that the pre-test probability of finding anything significant is low.  Approximately 30 minutes spent with this patient with 100% devoted to  review of laboratory data and counseling.

## 2014-01-24 NOTE — Patient Instructions (Signed)
Return in 6 months for lab MD visit 1 week later on 12/15

## 2014-02-06 ENCOUNTER — Encounter: Payer: BC Managed Care – PPO | Admitting: Neurology

## 2014-02-13 ENCOUNTER — Ambulatory Visit: Payer: BC Managed Care – PPO | Admitting: Neurology

## 2014-03-14 ENCOUNTER — Ambulatory Visit (INDEPENDENT_AMBULATORY_CARE_PROVIDER_SITE_OTHER): Payer: BC Managed Care – PPO | Admitting: Neurology

## 2014-03-14 DIAGNOSIS — G609 Hereditary and idiopathic neuropathy, unspecified: Secondary | ICD-10-CM

## 2014-03-14 DIAGNOSIS — G5621 Lesion of ulnar nerve, right upper limb: Secondary | ICD-10-CM

## 2014-03-14 NOTE — Procedures (Signed)
[facility]  [address]  [place], [place] [address] Tel: [phone] Fax:  [phone] Test Date:  03/14/2014  Patient: Megan [name] DOB: 1956-06-21 Physician: Narda Amber, DO  Sex: Female Height: 5\' 9"  Ref Phys: Narda Amber  ID#: [id number]   Technician:    Patient Complaints: This is a 58 year-old female with worsening bilateral feet pain/paresthesias, gait instability, and skin changes.  NCV & EMG Findings: Extensive electrodiagnostic testing of the right upper extremity and right lower extremity shows the following:  1. The ulnar sensory response has borderline latency prolongation with normal amplitude. The median and radial sensory responses are within normal limits. 2. Sural and superficial peroneal sensory responses are within normal limits. 3. The ulnar motor nerve recording at the abductor digitorum minimi showed prolonged distal onset latency (3.3 ms), reduced amplitude (5.8 mV), and decreased conduction velocity (A Elbow-B Elbow, 45 m/s). The ulnar motor response recording at the first dorsal interosseous muscle and median motor responses within normal limits.  4. Tibial and peroneal motor responses are within normal limits. 5. In the arm, mild chronic motor axon loss changes are seen affecting the abductor digiti minimi and flexor digitorum profundus 4&5, without accompanied active denervation. 6. In the leg, there is no evidence of active or chronic motor axon loss changes affecting any of the tested muscles   Impression: 1. Right ulnar neuropathy with slowing across the elbow, demyelinating and axon loss in type. Overall, these findings are moderate in degree electrically. 2. There is no evidence of a generalized sensorimotor polyradiculoneuropathy, cervical radiculopathy, or lumbosacral radiculopathy affecting the right side.    ___________________________ Narda Amber, DO    Nerve Conduction Studies Anti Sensory Summary Table   Site NR Peak (ms) Norm Peak (ms) P-T Amp (V) Norm P-T Amp  Right Median Anti Sensory (2nd Digit)  Wrist    3.4 <3.6 30.8 >15  Right Radial Anti Sensory (Base 1st Digit)  Wrist    2.6 <2.7 27.6 >14  Right Sup Peroneal Anti Sensory (Ant Lat Mall)  12 cm    2.4 <4.6 7.9 >4  Right Sural Anti Sensory (Lat Mall)  Calf    3.1 <4.6 7.0 >4  Right Ulnar Anti Sensory (5th Digit)  Wrist    3.1 <3.1 35.8 >10   Motor Summary Table   Site NR Onset (ms) Norm Onset (ms) O-P Amp (mV) Norm O-P Amp Site1 Site2 Delta-0 (ms) Dist (cm) Vel (m/s) Norm Vel (m/s)  Right Median Motor (Abd Poll Brev)  Wrist    3.0 <4.0 9.2 >6 Elbow Wrist 5.2 29.0 56 >50  Elbow    8.2  9.1         Right Peroneal Motor (Ext Dig Brev)  Ankle    3.8 <6.0 3.1 >2.5 B Fib Ankle 8.8 35.0 40 >40  B Fib    12.6  2.9  Poplt B Fib 2.2 10.0 45 >40  Poplt    14.8  2.8         Right Peroneal TA Motor (Tib Ant)  Fib Head    3.7 <4.5 4.2 >3 Poplit Fib Head 1.8 10.0 56 >40  Poplit    5.5  4.2         Right Tibial Motor (Abd Hall Brev)  Ankle    5.0 <6.0 13.4 >4 Knee Ankle 8.7 38.0 44 >40  Knee    13.7  7.4         Right Ulnar Motor (Abd Dig Minimi)  Wrist    3.3 <3.1 5.8 >7 B Elbow Wrist 3.5 23.0 66 >50  B Elbow    6.8  5.4  A Elbow B Elbow 2.2 10.0 45 >50  A Elbow    9.0  5.3         Right Ulnar (FDI) Motor (1st DI)  Wrist    4.1 <4.5 10.3 >7         F Wave Studies   NR F-Lat (ms) Lat Norm (ms) L-R F-Lat (ms)  Right Ulnar (Mrkrs) (Abd Dig Min)     32.19 <33    H Reflex Studies   NR H-Lat (ms) Lat Norm (ms) L-R H-Lat (ms)  Right Tibial (Gastroc)     35.51 <35    EMG   Side Muscle Ins Act Fibs Psw Fasc Number Recrt Dur Dur. Amp Amp. Poly Poly. Comment  Right 1stDorInt Nml Nml Nml Nml Nml Nml Nml Nml Nml Nml Nml Nml N/A  Right ABD Dig Min Nml Nml Nml Nml 1- Mod-R Few 1+ Few 1+ Nml Nml N/A  Right Ext Indicis Nml Nml Nml Nml Nml Nml Nml Nml Nml Nml Nml Nml N/A  Right PronatorTeres Nml Nml Nml Nml Nml Nml Nml Nml Nml Nml Nml Nml  N/A  Right FlexDigProf 4,5 Nml Nml Nml Nml 1- Mod-R Few 1+ Few 1+ Nml Nml N/A  Right Triceps Nml Nml Nml Nml Nml Nml Nml Nml Nml Nml Nml Nml N/A  Right Deltoid Nml Nml Nml Nml Nml Nml Nml Nml Nml Nml Nml Nml N/A  Right AntTibialis Nml Nml Nml Nml Nml Nml Nml Nml Nml Nml Nml Nml N/A  Right Gastroc Nml Nml Nml Nml Nml Nml Nml Nml Nml Nml Nml Nml N/A  Right Flex Dig Long Nml Nml Nml Nml Nml Nml Nml Nml Nml Nml Nml Nml N/A  Right RectFemoris Nml Nml Nml Nml Nml Nml Nml Nml Nml Nml Nml Nml N/A  Right GluteusMed Nml Nml Nml Nml Nml Nml Nml Nml Nml Nml Nml Nml N/A      Waveforms:

## 2014-03-15 ENCOUNTER — Other Ambulatory Visit: Payer: Self-pay | Admitting: Dermatology

## 2014-03-23 ENCOUNTER — Encounter: Payer: Self-pay | Admitting: Neurology

## 2014-03-23 ENCOUNTER — Ambulatory Visit (INDEPENDENT_AMBULATORY_CARE_PROVIDER_SITE_OTHER): Payer: BC Managed Care – PPO | Admitting: Neurology

## 2014-03-23 VITALS — BP 98/68 | HR 66 | Ht 69.0 in | Wt 130.5 lb

## 2014-03-23 DIAGNOSIS — G4489 Other headache syndrome: Secondary | ICD-10-CM

## 2014-03-23 DIAGNOSIS — R209 Unspecified disturbances of skin sensation: Secondary | ICD-10-CM

## 2014-03-23 DIAGNOSIS — H539 Unspecified visual disturbance: Secondary | ICD-10-CM

## 2014-03-23 NOTE — Progress Notes (Signed)
Follow-up Visit   Date: 03/23/2014    Megan [name] MRN: [id number] DOB: February 04, 1956   Interim History: Megan [name] is a 58 y.o. right-handed Caucasian female with history of osteoporosis, leukopenia, asthma, peripheral neuropathy of unknown etiology returning to the clinic for follow-up of bilateral feet pain.  She was last seen in the office on 12/06/2013.  History of present illness: She moved to [place] to help her parents in 2009. Of note, she reports finding out that the home she moved into there was a lot of mold and had to get the home.  In November 2009, she developed severe pneumonia and collapsed lung and was hospitalized at [facility]. She was not intubated during the stay. She was told she had adult onset asthma and contacted a holistic doctor who was able to wean her off inhalers. She was doing well until 2012, when she developed bilateral feet swelling and burning pain. She went to see Sports Medicine, Podiatry, and PT for possible plantar fascitiis. It improved transiently, but later that year, she developed cramping of the feet and discolaration. She even went to the Emergency Department on one occasion due due severe cyanosis of the feet and cold sensation. She was told to follow-up with her PCP who ordered lab testing and referred her to see [facility] for NCS/EMG which showed mild axonal peripheral neuropathy. She was being followed by Dr. Maureen Chatters who ordered additional labs and imaging of the brain, cervical and lumbar spine. Due to white matter changes seen on the brain and possibility of underlying vasculitis, she was given a two week trial of prednisone which did not change her symptoms. Labs were unrevealing (see below). Ultrasound of the lower extremity did not reveal any thrombus.   Her numbness and loss of balance has worsened over the years, but burning pain involving the soles of the feet has remained relatively unchanged. She also reports weakness of the  legs. She was previously very active and went to the gym 7-days per week, but now goes 4-5 days per week with lighter routines because she is unable to lift as much and has problems with her balance.   She sought a second opinion at [facility] and saw Dr. Elias Else in 2013. She underwent a second NCS/EMG which is interpreted as normal, although she was told she has neuropathy. Due to financial reasons, she did not followup at [facility] again. Autoimmune, inflammatory labs, as well as paraneoplastic panel was negative.   In October 2014, she started seeing Dr. Berdine Addison, neurologist, at [facility] who repeated NCS/EMG which was interpreted as sensorimotor peripheral neuropathy with axonal and demyelinating features.   From a symptomatic standpoint, she has been offered neuralgesic medications such as gabapentin, but she prefers to be on as little medication as possible so is not taking anything. Denies dry eyes, dry mouth, anhydrosis, or early satiety. She endorses light headedness. She was followed a vegan diet from 2013-2014 and recently added fish, chicken, and eggs to diet. She does not eat very much dairy and avoids gluten-products.   UPDATE 03/23/2014:  She is here to discuss lab and EMG results.  Her labs from the last visit showed positive for monoclonal lambda light chains.  Subsequent work-up by Dr. Beryle Beams in hematology/oncology did not show monoclonal gammopathy.  Her EMG did not show any evidence of peripheral neuropathy, there was a moderate right ulnar neuropathy, but denies any paresthesias of the right hand.  Overall, symptoms  of the legs are unchanged.  She continues to have burning pain, swelling, and discoloration of the skin.  She complains of dull retroorbital headache, associated with photophobia. No nausea and vomiting.  Duration is typically all day, occuring about once per week.  She does not like to take medications, so is not anything for  pain.  She feels that her eyes are swollen and has blurry vision.  She had her eyes checked which was normal.   She continues seek a lot of alternative therapies with yoga, accupunture, integrative medication, and "energy" exercises.      Medications:  Current Outpatient Prescriptions on File Prior to Visit  Medication Sig Dispense Refill  . acidophilus (RISAQUAD) CAPS Take 1 capsule by mouth 2 (two) times daily.       [name] CALCIUM PO Take 1 tablet by mouth daily.      . cholecalciferol (VITAMIN D) 1000 UNITS tablet Take 5,000 Units by mouth daily.       . CYANOCOBALAMIN PO Take 1 tablet by mouth daily.      . Digestive Enzymes (DIGESTIVE ENZYME PO) Take 1 tablet by mouth 3 (three) times daily.      [name] estradiol (VIVELLE-DOT) 0.0375 MG/24HR Place 1 patch onto the skin 2 (two) times a week. 1/2 patch      . levothyroxine (SYNTHROID, LEVOTHROID) 25 MCG tablet Take 25 mcg by mouth daily before breakfast.       . MAGNESIUM PO Take 1 tablet by mouth daily.      . Multiple Vitamin (MULTIVITAMIN WITH MINERALS) TABS Take 1 tablet by mouth daily.      . Multiple Vitamins-Iron (CHLORELLA PO) Take 1 tablet by mouth 2 (two) times daily.      . Niacin (VITAMIN B-3 PO) Take 1 tablet by mouth daily.      . NON FORMULARY Strontium 950 1 tab po qd      . NON FORMULARY Silica 54mg  1 tab po qd      . Progesterone 100 MG CAPS Take 0.25 tablets by mouth daily.       [name] venlafaxine (EFFEXOR) 37.5 MG tablet Take 37.5 mg by mouth at bedtime.      . vitamin C (ASCORBIC ACID) 500 MG tablet Take 500 mg by mouth daily.       No current facility-administered medications on file prior to visit.    Allergies: No Known Allergies   Review of Systems:  CONSTITUTIONAL: No fevers, chills, night sweats, or weight loss.   EYES: No visual changes or eye pain ENT: No hearing changes.  No history of nose bleeds.   RESPIRATORY: No cough, wheezing and shortness of breath.   CARDIOVASCULAR: Negative for chest pain, and  palpitations.   GI: Negative for abdominal discomfort, blood in stools or black stools.  No recent change in bowel habits.   GU:  No history of incontinence.   MUSCLOSKELETAL: + history of joint pain or swelling.  No myalgias.   SKIN: Negative for lesions, rash, and itching.   ENDOCRINE: Negative for cold or heat intolerance, polydipsia or goiter.   PSYCH:  No depression or anxiety symptoms.   NEURO: As Above.   Vital Signs:  BP 98/68  Pulse 66  Ht 5\' 9"  (1.753 m)  Wt 130 lb 8 oz (59.194 kg)  BMI 19.26 kg/m2  SpO2 98%  Neurological Exam: MENTAL STATUS including orientation to time, place, person, recent and remote memory, attention span and concentration, language, and fund of knowledge is  normal.  Speech is not dysarthric.  CRANIAL NERVES: No visual field defects. Pupils equal round and reactive to light.  Normal conjugate, extra-ocular eye movements in all directions of gaze.  No ptosis. Normal facial sensation.  Face is symmetric. Palate elevates symmetrically.  Tongue is midline.  MOTOR:  Motor strength is 5/5 in all extremities.  No atrophy, fasciculations or abnormal movements.  No pronator drift.  Tone is normal.  Increased purple hue of the soles of her feet.  No edema of the feet on my examination.  MSRs:  Reflexes are 2+/4 throughout.  SENSORY:  Intact to vibration, temperature, and light touch throughout.  COORDINATION/GAIT:  Gait narrow based and stable.   Data: Labs 12/06/2013:  2-hr glucose tolerance test, vitamin B12 932, vitamin B1 32, vitamin E 14.6, vitamin B6 29.9, zinc 87, copper 124, MMA 64*, folate >20, CRP <0.5, SPEP - no M protein, UPEP + monoclonal free Lambda light chains, ENA negative, ANA negative  Labs 10/25/2013: Sodium 143, potassium 4.2, calcium 9.5, AST 23, ALT 8, free T4 0.98, antithyroglobulin antibody negative, CRP 0.24, TPO antibody negative  Labs 09/14/2012: Lead 1.7   Labs 2013: cryoglobulin neg, ANA negative Copper 116, ANCA negative  especially, C3 101, C4 25, VDRL negative, paraneoplastic autoantibody negative, UPEP no M protein, CMV, EBV, HIV, hep acute panel   MRI brain 01/29/2012: Mild nonspecific white matter hyperintensities in the subcortical regions. 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   MRI cervical spine with and without contrast 01/29/2012: Prominent spondylitic changes at C5-6 and C6-7 without significant compression.   MRI of the lumbar spine without contrast 03/18/2012: Mild disc degenerative changes from L3-S1 with severe left-sided foraminal narrowing at L3-4 and L4-5 without definite root impingement   EMG performed at [facility] 10/15/2011: Mild distal sensory axonal neuropathy.  EMG performed at [facility]: Normal study. No evidence of large fiber sensory motor neuropathy.  EMG performed at [facility] bilateral lower extremity 06/02/2013: Significant sensorimotor axonal and demyelinating peripheral neuropathy. Upon my review, sensory and motor amplitudes are preserved the latency is normal. Late responses are prolonged (F-wave) and conduction velocity is borderline slow 38-42 m/s. Temperature limb was not recorded.  EMG performed at [facility] 03/14/2014:  1. Right ulnar neuropathy with slowing across the elbow, demyelinating and axon loss in type. Overall, these findings are moderate in degree electrically. 2. There is no evidence of a generalized sensorimotor polyradiculoneuropathy, cervical radiculopathy, or lumbosacral radiculopathy affecting the right side.    IMPRESSION/PLAN: Megan [name] is a 58 year old female returning for evaluation of peripheral neuropathy.  She has underwent extensive testing with multiple neurologists (Dr. Maureen Chatters at [facility], Dr. Berdine Addison at [place], and [facility] center) which have suggested she has peripheral neuropathy based on clinical  symptoms.   Previous evaluation has included several EMGs, some showing evidence of neuropathy and others which are normal. She is also underwent extensive laboratory testing including CRP, ANA, and color, C3, C4, VDRL, cryoglobulin, TPO antibody, thyroglobulin antibody, CMV, EBV, HIV, hepatitis panel, UPEP, and several paraneoplastic antibody panels which have been nondiagnostic. Imaging of the brain showed nonspecific white matter changes, I am unable to review images personally as this CDs provided did not open.  Possible vasculitis was raised in the past.    Repeat electrodiagnostic testing peformed by myself does not show any evidence of large fiber neuropathy.  She has right ulnar neuropathy which is asymptomatic.  I explained that she most likely has small fiber neuropathy, which is not detected by NCS/EMG.  Although skin biopsy and QSART will be diagnostic, the results will not change management.  Other possibilities to with her skin discoloration and cold temperature of her toes, include either Raynaud's phenomena or reflex sympathetic dystrophy.   I had a lengthy discussion explaining that in most cases of neuropathy even when we look for potential treatable causes, idiopathic peripheral neuropathy is most common.  Unfortunately, in these situations, management is symptomatic.  She is very concerned about her symptoms and also is worried about demyelinating disease.  In the setting of new headaches, we can certainly repeat brain imaging to see if there is any progression of white matter changes, if so, the next step would be lumbar puncture.  She had multiple questions about mold and lead toxicity which is being evaluated by her holistic doctors.  I tried to answer them to the best of my ability, but due to be outside of my realm of expertise, would defer this to her holistic providers.   PLAN/RECOMMENDATIONS:  1.  MRI brain wwo contrast 2.  If negative, referral to Rheumatology for feet  swelling and skin changes 3.  Symptomatic medications offered to patient, but she declined.     The duration of this appointment visit was 25 minutes of face-to-face time with the patient.  Greater than 50% of this time was spent in counseling, explanation of diagnosis, planning of further management, and coordination of care.   Thank you for allowing me to participate in patient's care.  If I can answer any additional questions, I would be pleased to do so.    Sincerely,    Cyndia Degraff K. Posey Pronto, DO

## 2014-03-23 NOTE — Patient Instructions (Signed)
1.  MRI brain wwo contrast 2.  Telephone update with results

## 2014-04-12 ENCOUNTER — Ambulatory Visit (HOSPITAL_COMMUNITY): Admission: RE | Admit: 2014-04-12 | Payer: BC Managed Care – PPO | Source: Ambulatory Visit

## 2014-04-26 ENCOUNTER — Ambulatory Visit (HOSPITAL_COMMUNITY): Payer: BC Managed Care – PPO

## 2014-04-27 ENCOUNTER — Encounter: Payer: BC Managed Care – PPO | Admitting: Neurology

## 2014-05-04 ENCOUNTER — Ambulatory Visit: Payer: BC Managed Care – PPO | Admitting: Neurology

## 2014-05-09 ENCOUNTER — Other Ambulatory Visit (INDEPENDENT_AMBULATORY_CARE_PROVIDER_SITE_OTHER): Payer: Self-pay | Admitting: Otolaryngology

## 2014-05-09 DIAGNOSIS — J320 Chronic maxillary sinusitis: Secondary | ICD-10-CM

## 2014-05-10 ENCOUNTER — Other Ambulatory Visit: Payer: BC Managed Care – PPO

## 2014-05-16 ENCOUNTER — Ambulatory Visit
Admission: RE | Admit: 2014-05-16 | Discharge: 2014-05-16 | Disposition: A | Discharge status: Home / Self Care | Payer: BC Managed Care – PPO | Source: Ambulatory Visit | Attending: Otolaryngology | Admitting: Otolaryngology

## 2014-05-16 ENCOUNTER — Ambulatory Visit
Admission: RE | Admit: 2014-05-16 | Discharge: 2014-05-16 | Disposition: A | Discharge status: Home / Self Care | Payer: BC Managed Care – PPO | Source: Ambulatory Visit | Attending: Neurology | Admitting: Neurology

## 2014-05-16 DIAGNOSIS — G4489 Other headache syndrome: Secondary | ICD-10-CM

## 2014-05-16 DIAGNOSIS — J320 Chronic maxillary sinusitis: Secondary | ICD-10-CM

## 2014-05-16 DIAGNOSIS — H539 Unspecified visual disturbance: Secondary | ICD-10-CM

## 2014-05-16 DIAGNOSIS — R209 Unspecified disturbances of skin sensation: Secondary | ICD-10-CM

## 2014-05-16 MED ORDER — GADOBENATE DIMEGLUMINE 529 MG/ML IV SOLN
11.0000 mL | Freq: Once | INTRAVENOUS | Status: AC | PRN
  Administered 2014-05-16: 11 mL via INTRAVENOUS

## 2014-05-16 NOTE — Progress Notes (Addendum)
Addendum 05/16/2014  Patient underwent MRI brain wwo contrast which did not show any new findings.  Stable mild chronic changes and incidental note of pineal cyst, which is unlikely to be playing a role in her current symptomatology.  MRI brain 05/16/2014: 1. No evidence of acute intracranial abnormality or mass.  2. Mild chronic small vessel ischemic disease.  3. Incidental pineal cyst, unchanged.  Results discussed with patient via telephone and reassured here that there is no evidence of demyelinating disease, such as multiple sclerosis, which is what she was most concerned about.  She has idiopathic peripheral neuropathy and is not interested in medications for pain.  Otherwise, management is symptomatic. No further neurological work-up indicated.  For her skin changes and swelling, symptoms be due due to reynaud's phenomona vs reflexive sympathetic dystrophy.  She had had extensive autoimmune/inflammatory work-up which has been negative and I'm not sure if rheumatology evaluation would be helpful in this case, but can be considered if symptoms worsen.  Return to clinic as needed.  Donika K. Posey Pronto, DO

## 2014-05-16 NOTE — Progress Notes (Signed)
Note and MRI faxed

## 2014-05-23 ENCOUNTER — Other Ambulatory Visit: Payer: Self-pay | Admitting: Otolaryngology

## 2014-06-07 ENCOUNTER — Encounter (HOSPITAL_BASED_OUTPATIENT_CLINIC_OR_DEPARTMENT_OTHER): Payer: Self-pay | Admitting: *Deleted

## 2014-06-07 NOTE — Progress Notes (Signed)
No labs needed-a friend to bring-and will need to be called back to pick her up

## 2014-06-12 ENCOUNTER — Ambulatory Visit (HOSPITAL_BASED_OUTPATIENT_CLINIC_OR_DEPARTMENT_OTHER): Payer: BC Managed Care – PPO | Admitting: Anesthesiology

## 2014-06-12 ENCOUNTER — Encounter (HOSPITAL_BASED_OUTPATIENT_CLINIC_OR_DEPARTMENT_OTHER)
Admission: RE | Disposition: A | Discharge status: Home / Self Care | Payer: Self-pay | Source: Ambulatory Visit | Attending: Otolaryngology

## 2014-06-12 ENCOUNTER — Ambulatory Visit (HOSPITAL_BASED_OUTPATIENT_CLINIC_OR_DEPARTMENT_OTHER)
Admission: RE | Admit: 2014-06-12 | Discharge: 2014-06-12 | Disposition: A | Discharge status: Home / Self Care | Payer: BC Managed Care – PPO | Source: Ambulatory Visit | Attending: Otolaryngology | Admitting: Otolaryngology

## 2014-06-12 ENCOUNTER — Encounter (HOSPITAL_BASED_OUTPATIENT_CLINIC_OR_DEPARTMENT_OTHER): Payer: BC Managed Care – PPO | Admitting: Anesthesiology

## 2014-06-12 ENCOUNTER — Encounter (HOSPITAL_BASED_OUTPATIENT_CLINIC_OR_DEPARTMENT_OTHER): Payer: Self-pay

## 2014-06-12 DIAGNOSIS — J320 Chronic maxillary sinusitis: Secondary | ICD-10-CM

## 2014-06-12 HISTORY — PX: NASAL SINUS SURGERY: SHX719

## 2014-06-12 LAB — POCT HEMOGLOBIN-HEMACUE: Hemoglobin: 14.1 g/dL (ref 12.0–15.0)

## 2014-06-12 SURGERY — SINUS SURGERY, ENDOSCOPIC
Anesthesia: General | Site: Nose | Laterality: Right

## 2014-06-12 MED ORDER — OXYCODONE HCL 5 MG PO TABS: 5.0000 mg | ORAL_TABLET | Freq: Once | ORAL | Status: DC | PRN

## 2014-06-12 MED ORDER — FENTANYL CITRATE 0.05 MG/ML IJ SOLN
INTRAMUSCULAR | Status: DC | PRN
  Administered 2014-06-12: 100 ug via INTRAVENOUS

## 2014-06-12 MED ORDER — LIDOCAINE-EPINEPHRINE 1 %-1:100000 IJ SOLN
INTRAMUSCULAR | Status: AC
  Filled 2014-06-12: qty 1

## 2014-06-12 MED ORDER — CEFAZOLIN SODIUM-DEXTROSE 2-3 GM-% IV SOLR
INTRAVENOUS | Status: AC
  Filled 2014-06-12: qty 50

## 2014-06-12 MED ORDER — MIDAZOLAM HCL 2 MG/2ML IJ SOLN: 0.5000 mg | Freq: Once | INTRAMUSCULAR | Status: DC | PRN

## 2014-06-12 MED ORDER — MIDAZOLAM HCL 2 MG/2ML IJ SOLN: 1.0000 mg | INTRAMUSCULAR | Status: DC | PRN

## 2014-06-12 MED ORDER — LACTATED RINGERS IV SOLN
INTRAVENOUS | Status: DC
  Administered 2014-06-12 (×2): via INTRAVENOUS

## 2014-06-12 MED ORDER — SCOPOLAMINE 1 MG/3DAYS TD PT72
MEDICATED_PATCH | TRANSDERMAL | Status: AC
  Filled 2014-06-12: qty 1

## 2014-06-12 MED ORDER — PROMETHAZINE HCL 25 MG/ML IJ SOLN: 6.2500 mg | INTRAMUSCULAR | Status: DC | PRN

## 2014-06-12 MED ORDER — HYDROMORPHONE HCL 1 MG/ML IJ SOLN
INTRAMUSCULAR | Status: AC
  Filled 2014-06-12: qty 1

## 2014-06-12 MED ORDER — DIPHENHYDRAMINE HCL 50 MG/ML IJ SOLN
INTRAMUSCULAR | Status: DC | PRN
  Administered 2014-06-12: 12.5 mg via INTRAVENOUS

## 2014-06-12 MED ORDER — MIDAZOLAM HCL 2 MG/2ML IJ SOLN
INTRAMUSCULAR | Status: AC
  Filled 2014-06-12: qty 2

## 2014-06-12 MED ORDER — OXYMETAZOLINE HCL 0.05 % NA SOLN
NASAL | Status: AC
  Filled 2014-06-12: qty 15

## 2014-06-12 MED ORDER — PROPOFOL 10 MG/ML IV BOLUS
INTRAVENOUS | Status: DC | PRN
  Administered 2014-06-12: 50 mg via INTRAVENOUS
  Administered 2014-06-12: 100 mg via INTRAVENOUS
  Administered 2014-06-12: 50 mg via INTRAVENOUS

## 2014-06-12 MED ORDER — ONDANSETRON HCL 4 MG/2ML IJ SOLN
INTRAMUSCULAR | Status: DC | PRN
  Administered 2014-06-12: 4 mg via INTRAVENOUS

## 2014-06-12 MED ORDER — MEPERIDINE HCL 25 MG/ML IJ SOLN
6.2500 mg | INTRAMUSCULAR | Status: DC | PRN
  Administered 2014-06-12: 6.25 mg via INTRAVENOUS

## 2014-06-12 MED ORDER — LIDOCAINE-EPINEPHRINE 1 %-1:100000 IJ SOLN
INTRAMUSCULAR | Status: DC | PRN
  Administered 2014-06-12: 1.25 mL

## 2014-06-12 MED ORDER — PROPOFOL 10 MG/ML IV BOLUS
INTRAVENOUS | Status: AC
  Filled 2014-06-12: qty 20

## 2014-06-12 MED ORDER — MIDAZOLAM HCL 5 MG/5ML IJ SOLN
INTRAMUSCULAR | Status: DC | PRN
  Administered 2014-06-12: 2 mg via INTRAVENOUS

## 2014-06-12 MED ORDER — BACITRACIN ZINC 500 UNIT/GM EX OINT
TOPICAL_OINTMENT | CUTANEOUS | Status: DC | PRN
  Administered 2014-06-12: 1 via TOPICAL

## 2014-06-12 MED ORDER — BACITRACIN ZINC 500 UNIT/GM EX OINT
TOPICAL_OINTMENT | CUTANEOUS | Status: AC
  Filled 2014-06-12: qty 28.35

## 2014-06-12 MED ORDER — LIDOCAINE HCL (CARDIAC) 10 MG/ML IV SOLN
INTRAVENOUS | Status: DC | PRN
  Administered 2014-06-12: 30 mg via INTRAVENOUS

## 2014-06-12 MED ORDER — OXYCODONE HCL 5 MG/5ML PO SOLN: 5.0000 mg | Freq: Once | ORAL | Status: DC | PRN

## 2014-06-12 MED ORDER — MUPIROCIN 2 % EX OINT
TOPICAL_OINTMENT | CUTANEOUS | Status: AC
  Filled 2014-06-12: qty 22

## 2014-06-12 MED ORDER — FENTANYL CITRATE 0.05 MG/ML IJ SOLN
INTRAMUSCULAR | Status: AC
Start: 2014-06-12 — End: 2014-06-12
  Filled 2014-06-12: qty 4

## 2014-06-12 MED ORDER — SUCCINYLCHOLINE CHLORIDE 20 MG/ML IJ SOLN
INTRAMUSCULAR | Status: DC | PRN
  Administered 2014-06-12: 100 mg via INTRAVENOUS

## 2014-06-12 MED ORDER — SCOPOLAMINE 1 MG/3DAYS TD PT72
1.0000 | MEDICATED_PATCH | Freq: Once | TRANSDERMAL | Status: DC
  Administered 2014-06-12: 1.5 mg via TRANSDERMAL

## 2014-06-12 MED ORDER — OXYMETAZOLINE HCL 0.05 % NA SOLN
NASAL | Status: DC | PRN
  Administered 2014-06-12: 1 via NASAL

## 2014-06-12 MED ORDER — MEPERIDINE HCL 25 MG/ML IJ SOLN
INTRAMUSCULAR | Status: AC
  Filled 2014-06-12: qty 1

## 2014-06-12 MED ORDER — FENTANYL CITRATE 0.05 MG/ML IJ SOLN: 50.0000 ug | INTRAMUSCULAR | Status: DC | PRN

## 2014-06-12 MED ORDER — HYDROMORPHONE HCL 1 MG/ML IJ SOLN
0.2500 mg | INTRAMUSCULAR | Status: DC | PRN
  Administered 2014-06-12 (×2): 0.5 mg via INTRAVENOUS

## 2014-06-12 MED ORDER — OXYCODONE-ACETAMINOPHEN 5-325 MG PO TABS: 1.0000 | ORAL_TABLET | ORAL | Status: DC | PRN

## 2014-06-12 MED ORDER — AMOXICILLIN 875 MG PO TABS: 875.0000 mg | ORAL_TABLET | Freq: Two times a day (BID) | ORAL | Status: DC

## 2014-06-12 SURGICAL SUPPLY — 42 items
ATTRACTOMAT 16X20 MAGNETIC DRP (DRAPES) IMPLANT
BLADE RAD40 ROTATE 4M 4 5PK (BLADE) IMPLANT
BLADE RAD60 ROTATE M4 4 5PK (BLADE) IMPLANT
BLADE TRICUT ROTATE M4 4 5PK (BLADE) ×2 IMPLANT
BUR HS RAD FRONTAL 3 (BURR) IMPLANT
CANISTER SUC SOCK COL 7IN (MISCELLANEOUS) ×2 IMPLANT
CANISTER SUCT 1200ML W/VALVE (MISCELLANEOUS) ×2 IMPLANT
COAGULATOR SUCT 8FR VV (MISCELLANEOUS) IMPLANT
COAGULATOR SUCT SWTCH 10FR 6 (ELECTROSURGICAL) IMPLANT
DECANTER SPIKE VIAL GLASS SM (MISCELLANEOUS) IMPLANT
DRSG NASAL KENNEDY LMNT 8CM (GAUZE/BANDAGES/DRESSINGS) IMPLANT
DRSG NASOPORE 8CM (GAUZE/BANDAGES/DRESSINGS) ×2 IMPLANT
DRSG TELFA 3X8 NADH (GAUZE/BANDAGES/DRESSINGS) IMPLANT
ELECT REM PT RETURN 9FT ADLT (ELECTROSURGICAL) ×2
ELECTRODE REM PT RTRN 9FT ADLT (ELECTROSURGICAL) ×1 IMPLANT
GLOVE BIO SURGEON STRL SZ7.5 (GLOVE) ×2 IMPLANT
GLOVE BIOGEL M 7.0 STRL (GLOVE) ×2 IMPLANT
GLOVE BIOGEL PI IND STRL 7.5 (GLOVE) ×1 IMPLANT
GLOVE BIOGEL PI INDICATOR 7.5 (GLOVE) ×1
GOWN STRL REUS W/ TWL LRG LVL3 (GOWN DISPOSABLE) ×2 IMPLANT
GOWN STRL REUS W/TWL LRG LVL3 (GOWN DISPOSABLE) ×2
HEMOSTAT SURGICEL 2X14 (HEMOSTASIS) IMPLANT
IV NS 500ML (IV SOLUTION)
IV NS 500ML BAXH (IV SOLUTION) IMPLANT
NEEDLE HYPO 25X1 1.5 SAFETY (NEEDLE) ×2 IMPLANT
NEEDLE SPNL 25GX3.5 QUINCKE BL (NEEDLE) ×2 IMPLANT
NS IRRIG 1000ML POUR BTL (IV SOLUTION) ×2 IMPLANT
PACK BASIN DAY SURGERY FS (CUSTOM PROCEDURE TRAY) ×2 IMPLANT
PACK ENT DAY SURGERY (CUSTOM PROCEDURE TRAY) ×2 IMPLANT
SET EXT MALE ROTATING LL 32IN (MISCELLANEOUS) IMPLANT
SLEEVE SCD COMPRESS KNEE MED (MISCELLANEOUS) IMPLANT
SOLUTION BUTLER CLEAR DIP (MISCELLANEOUS) ×2 IMPLANT
SPLINT NASAL AIRWAY SILICONE (MISCELLANEOUS) IMPLANT
SPONGE GAUZE 2X2 8PLY STRL LF (GAUZE/BANDAGES/DRESSINGS) ×2 IMPLANT
SPONGE NEURO XRAY DETECT 1X3 (DISPOSABLE) ×2 IMPLANT
SUT ETHILON 3 0 PS 1 (SUTURE) IMPLANT
SUT PLAIN 4 0 ~~LOC~~ 1 (SUTURE) IMPLANT
SWAB COLLECTION DEVICE MRSA (MISCELLANEOUS) ×2 IMPLANT
TOWEL OR 17X24 6PK STRL BLUE (TOWEL DISPOSABLE) ×2 IMPLANT
TUBE CONNECTING 20X1/4 (TUBING) ×2 IMPLANT
TUBE SALEM SUMP 16 FR W/ARV (TUBING) IMPLANT
YANKAUER SUCT BULB TIP NO VENT (SUCTIONS) IMPLANT

## 2014-06-12 NOTE — Anesthesia Procedure Notes (Signed)
Procedure Name: Intubation Date/Time: 06/12/2014 9:48 AM Performed by: Lyndee Leo Pre-anesthesia Checklist: Patient identified, Emergency Drugs available, Suction available and Patient being monitored Patient Re-evaluated:Patient Re-evaluated prior to inductionOxygen Delivery Method: Circle System Utilized Preoxygenation: Pre-oxygenation with 100% oxygen Intubation Type: IV induction Ventilation: Mask ventilation without difficulty Laryngoscope Size: Mac and 3 Grade View: Grade III Tube type: Oral Tube size: 7.0 mm Number of attempts: 1 Airway Equipment and Method: stylet and oral airway Placement Confirmation: ETT inserted through vocal cords under direct vision,  positive ETCO2 and breath sounds checked- equal and bilateral Tube secured with: Tape Dental Injury: Teeth and Oropharynx as per pre-operative assessment

## 2014-06-12 NOTE — Brief Op Note (Signed)
06/12/2014  10:47 AM  PATIENT:  Megan [name]  58 y.o. female  PRE-OPERATIVE DIAGNOSIS:  CHRONIC RIGHT MAXILLARY SINUSITIS  POST-OPERATIVE DIAGNOSIS:  CHRONIC RIGHT MAXILLARY SINUSITIS  PROCEDURE:  Procedure(s): RIGHT ENDOSCOPIC MAXILLARY ANTROSTOMY WITH TISSUE REMOVAL  SURGEON:  Surgeon(s) and Role:    * Sui W Angeleah Labrake, MD - Primary  PHYSICIAN ASSISTANT:   ASSISTANTS: none   ANESTHESIA:   general  EBL:  Total I/O In: 1000 [I.V.:1000] Out: -   BLOOD ADMINISTERED:none  DRAINS: none   LOCAL MEDICATIONS USED:  LIDOCAINE   SPECIMEN:  Source of Specimen:  Right maxillary sinus contents  DISPOSITION OF SPECIMEN:  PATHOLOGY  COUNTS:  YES  TOURNIQUET:  * No tourniquets in log *  DICTATION: .Other Dictation: Dictation Number [id number]  PLAN OF CARE: Discharge to home after PACU  PATIENT DISPOSITION:  PACU - hemodynamically stable.   Delay start of Pharmacological VTE agent (>24hrs) due to surgical blood loss or risk of bleeding: not applicable

## 2014-06-12 NOTE — H&P (Signed)
Cc: Recurrent sinusitis  HPI: The patient is a 58 year old female who returns today for her follow-up evaluation.  The patient was last seen 1 week ago. She was experiencing bilateral periorbital pain, worse on the right side.  Her fungal culture was noted to be positive.  She was treated with amphotericin B capsules and sinus irrigation.  However, the patient could not tolerate the medication secondary to the severe side effects.  She subsequently underwent a paranasal sinus CT scan.  The CT showed complete opacification of the right maxillary sinus.  The right maxillary sinus was hypoplastic.  The patient returns reporting no significant change in her symptoms. She continues to have midface and periorbital pain, worse on the right side.   Exam: The nasal cavities were decongested and anesthetised with a combination of oxymetazoline and 4% lidocaine solution.  The flexible scope was inserted into the right nasal cavity.  Endoscopy of the inferior and middle meatus was performed.  Moderately edematous mucosa was noted.  No polyp, mass, or lesion was appreciated.  Olfactory cleft was clear.  Nasopharynx was clear.  Turbinates were hypertrophied but without mass.  The procedure was repeated on the contralateral side with similar findings.  The patient tolerated the procedure well.  Instructions were given to avoid eating or drinking for 2 hours.    Assessment: 1.  Persistent bilateral nasal mucosal congestion.   2.  Her CT scan is consistent with chronic right maxillary sinusitis.   3.  With her positive fungal culture, the CT images are concerning for chronic fungal rhinosinusitis.   Plan: 1.  The CT images and the nasal endoscopy findings are reviewed with the patient.  2.  In light of the above findings, the patient may benefit from undergoing surgical intervention with right maxillary antrostomy and debridement of the right maxillary sinus contents.  3.  The risks, benefits, alternatives and  details of the procedure are reviewed with the patient.  4.  We will schedule the procedure in accordance with the patient's schedule.

## 2014-06-12 NOTE — Anesthesia Postprocedure Evaluation (Signed)
  Anesthesia Post-op Note  Patient: Megan [name]  Procedure(s) Performed: Procedure(s): RIGHT ENDOSCOPIC MAXILLARY ANTROSTOMY (Right)  Patient Location: PACU  Anesthesia Type:General  Level of Consciousness: awake, alert , oriented and patient cooperative  Airway and Oxygen Therapy: Patient Spontanous Breathing  Post-op Pain: none  Post-op Assessment: Post-op Vital signs reviewed, Patient's Cardiovascular Status Stable, Respiratory Function Stable, Patent Airway, No signs of Nausea or vomiting and Pain level controlled  Post-op Vital Signs: Reviewed and stable  Last Vitals:  Filed Vitals:   06/12/14 1200  BP: 121/62  Pulse: 75  Temp:   Resp: 12    Complications: No apparent anesthesia complications

## 2014-06-12 NOTE — Anesthesia Preprocedure Evaluation (Addendum)
Anesthesia Evaluation  Patient identified by MRN, date of birth, ID band Patient awake    Reviewed: Allergy & Precautions, H&P , NPO status , Patient's Chart, lab work & pertinent test results  History of Anesthesia Complications Negative for: history of anesthetic complications  Airway Mallampati: I TM Distance: >3 FB Neck ROM: Full    Dental  (+) Teeth Intact, Dental Advisory Given   Pulmonary asthma (no inhalers needed in past 4 years) ,  breath sounds clear to auscultation        Cardiovascular - angina+ Valvular Problems/Murmurs MVP Rhythm:Regular Rate:Normal  '14 ECHO: EF 55-60%, mild MVP without MR    Neuro/Psych Depression  Neuromuscular disease (peripheral neuropathy)    GI/Hepatic negative GI ROS, Neg liver ROS,   Endo/Other  Hypothyroidism   Renal/GU negative Renal ROS     Musculoskeletal   Abdominal   Peds  Hematology negative hematology ROS (+)   Anesthesia Other Findings   Reproductive/Obstetrics                        Anesthesia Physical Anesthesia Plan  ASA: II  Anesthesia Plan: General   Post-op Pain Management:    Induction: Intravenous  Airway Management Planned: Oral ETT  Additional Equipment:   Intra-op Plan:   Post-operative Plan: Extubation in OR  Informed Consent: I have reviewed the patients History and Physical, chart, labs and discussed the procedure including the risks, benefits and alternatives for the proposed anesthesia with the patient or authorized representative who has indicated his/her understanding and acceptance.   Dental advisory given  Plan Discussed with: CRNA and Surgeon  Anesthesia Plan Comments: (Plan routine monitors, GETA)        Anesthesia Quick Evaluation

## 2014-06-12 NOTE — Transfer of Care (Signed)
Immediate Anesthesia Transfer of Care Note  Patient: Megan [name]  Procedure(s) Performed: Procedure(s): RIGHT ENDOSCOPIC MAXILLARY ANTROSTOMY (Right)  Patient Location: PACU  Anesthesia Type:General  Level of Consciousness: awake, sedated and patient cooperative  Airway & Oxygen Therapy: Patient Spontanous Breathing and aerosol face mask  Post-op Assessment: Report given to PACU RN and Post -op Vital signs reviewed and stable  Post vital signs: Reviewed and stable  Complications: No apparent anesthesia complications

## 2014-06-12 NOTE — Discharge Instructions (Addendum)

## 2014-06-13 ENCOUNTER — Encounter (HOSPITAL_BASED_OUTPATIENT_CLINIC_OR_DEPARTMENT_OTHER): Payer: Self-pay | Admitting: Otolaryngology

## 2014-06-13 NOTE — Op Note (Signed)
NAMELORENNA, LURRY NO.:  [id number]  MEDICAL RECORD NO.:  [id number]  LOCATION:                                 FACILITY:  PHYSICIAN:  Leta Baptist, MD                 DATE OF BIRTH:  DATE OF PROCEDURE:  06/12/2014 DATE OF DISCHARGE:                              OPERATIVE REPORT   SURGEON:  Leta Baptist, MD  PREOPERATIVE DIAGNOSIS:  Chronic right maxillary sinusitis.  POSTOPERATIVE DIAGNOSIS:  Chronic right maxillary sinusitis.  PROCEDURE PERFORMED:  Right maxillary antrostomy with tissue removal.  ANESTHESIA:  General endotracheal tube anesthesia.  COMPLICATIONS:  None.  ESTIMATED BLOOD LOSS:  Less than 20 mL.  INDICATION FOR PROCEDURE:  The patient is a 58 year old female with a history of chronic facial pain.  On her sinus CT scan, she was noted to have complete opacification of her right maxillary sinus.  Her right maxillary sinus was hypoplastic.  In addition, she was also recently noted to have positive fungal culture from her urine.  She was treated with amphotericin B capsules.  However, she could not tolerate the side effects of the medication.  Based on the above findings, the decision was made for the patient to undergo the above stated procedure.  The plan was to culture the right maxillary sinus content.  The risks, benefits, alternatives, and details of the procedure were discussed with the patient.  Questions were invited and answered.  Informed consent was obtained.  DESCRIPTION OF PROCEDURE:  The patient was taken to the operating room and placed supine on the operating table.  General endotracheal tube anesthesia was administered by the anesthesiologist.  The patient was positioned, and prepped and draped in the standard fashion for nasal surgery.  Pledgets soaked with Afrin were placed in both nasal cavities for vasoconstriction.  The pledgets were subsequently removed. Endoscopic evaluation of the right nasal cavity revealed inflamed  and edematous mucosa.  The right middle turbinate was carefully medialized. A 1% Lidocaine with 1:100,000 epinephrine was injected onto the lateral nasal wall.  A standard uncinectomy procedure was performed with a Soil scientist.  The maxillary antrum was then identified.  It was carefully enlarged with a combination of backbiters and Tru-Cut forceps.  A copious amount of mucoid drainage was suctioned from the right maxillary sinus.  Polypoid tissue was also removed from the maxillary antrum. Aerobic, anaerobic, and fungal culture specimens were obtained.  The right maxillary sinus was then copiously irrigated.  No obvious fungal ball or fungal elements were identified.  NasoPore packing was placed. That concluded the procedure for the patient.  The care of the patient was turned over to the anesthesiologist.  The patient was awakened from anesthesia without difficulty.  She was extubated and transferred to the recovery room in good condition.  OPERATIVE FINDINGS:  Hypoplastic right maxillary sinus was noted. Polypoid tissue was removed from the maxillary antrum and maxillary sinus.  A copious amount of mucoid secretion was suctioned from the right maxillary sinus.  However no obvious fungal ball was noted.  SPECIMEN:  Right maxillary sinus content.  FOLLOWUP CARE:  The patient will  be discharged home once she is awake and alert.  She will be placed on Percocet p.r.n. pain and amoxicillin b.i.d. for 5 days.  The patient will follow up in my office in 1 week.     Leta Baptist, MD     ST/MEDQ  D:  06/12/2014  T:  06/13/2014  Job:  [id number]

## 2014-06-15 LAB — CULTURE, ROUTINE-SINUS

## 2014-06-17 LAB — ANAEROBIC CULTURE

## 2014-07-10 LAB — FUNGUS CULTURE W SMEAR: Fungal Smear: NONE SEEN

## 2014-07-25 LAB — AFB CULTURE WITH SMEAR (NOT AT ARMC): Acid Fast Smear: NONE SEEN

## 2014-08-01 ENCOUNTER — Other Ambulatory Visit (INDEPENDENT_AMBULATORY_CARE_PROVIDER_SITE_OTHER): Payer: BC Managed Care – PPO

## 2014-08-01 DIAGNOSIS — D72819 Decreased white blood cell count, unspecified: Secondary | ICD-10-CM | POA: Diagnosis not present

## 2014-08-01 LAB — SAVE SMEAR

## 2014-08-01 LAB — CBC WITH DIFFERENTIAL/PLATELET
Basophils Absolute: 0 10*3/uL (ref 0.0–0.1)
Basophils Relative: 1 % (ref 0–1)
Eosinophils Absolute: 0 10*3/uL (ref 0.0–0.7)
Eosinophils Relative: 1 % (ref 0–5)
HCT: 41 % (ref 36.0–46.0)
Hemoglobin: 14 g/dL (ref 12.0–15.0)
Lymphocytes Relative: 30 % (ref 12–46)
Lymphs Abs: 0.7 10*3/uL (ref 0.7–4.0)
MCH: 31 pg (ref 26.0–34.0)
MCHC: 34.1 g/dL (ref 30.0–36.0)
MCV: 90.9 fL (ref 78.0–100.0)
Monocytes Absolute: 0.1 10*3/uL (ref 0.1–1.0)
Monocytes Relative: 6 % (ref 3–12)
Neutro Abs: 1.5 10*3/uL — ABNORMAL LOW (ref 1.7–7.7)
Neutrophils Relative %: 62 % (ref 43–77)
Platelets: 197 10*3/uL (ref 150–400)
RBC: 4.51 MIL/uL (ref 3.87–5.11)
RDW: 12.8 % (ref 11.5–15.5)
WBC: 2.4 10*3/uL — ABNORMAL LOW (ref 4.0–10.5)

## 2014-08-08 ENCOUNTER — Encounter: Payer: Self-pay | Admitting: Oncology

## 2014-08-08 ENCOUNTER — Ambulatory Visit (INDEPENDENT_AMBULATORY_CARE_PROVIDER_SITE_OTHER): Payer: BC Managed Care – PPO | Admitting: Oncology

## 2014-08-08 VITALS — BP 99/60 | HR 64 | Temp 97.6°F | Ht 69.0 in | Wt 129.5 lb

## 2014-08-08 DIAGNOSIS — D72819 Decreased white blood cell count, unspecified: Secondary | ICD-10-CM

## 2014-08-08 DIAGNOSIS — G629 Polyneuropathy, unspecified: Secondary | ICD-10-CM | POA: Diagnosis not present

## 2014-08-08 NOTE — Progress Notes (Addendum)
Patient ID: Megan [name], female   DOB: 01/07/1956, 58 y.o.   MRN: [id number] Hematology and Oncology Follow Up Visit  Megan [name] [id number] 1955-11-12 58 y.o. 08/08/2014 1:36 PM   Principle Diagnosis: Encounter Diagnoses  Name Primary?  . Leukopenia Yes  . Peripheral neuropathy      Interim History:  Follow-up visit for this 58 year old woman. Please see my office progress note dated 12/26/2013 and 01/24/2014 for complete details of this lady's past history. Briefly, she was initially referred here in October 2013 to evaluate chronic neutropenia and idiopathic distal neuropathy affecting her feet. An extensive evaluation to date including myeloma screen, screen for collagen vascular disorders, screen for bone marrow suppressive viral agents, toxic drug screen including lead levels, were all unrevealing. A single spot urine specimen was positive for the presence of monoclonal lambda immunoglobulin. However when I repeated a 24-hour urine study in June of this year, there was no proteinuria and no monoclonal proteins. She tested negative 2 for the presence of cryoglobulins. Normal ceruloplasmin level. She has a decrease in total serum IgG level without any monoclonal proteins in the serum. Other than the chronic leukopenia with a normal white count differential, she has never developed anemia or thrombocytopenia. She has no adenopathy or organomegaly on exam. She has never developed any progressive constitutional symptoms. Neuropathy remains limited to her feet. She had partial relief of her symptoms on a Vegan diet. Since her last visit with me she has had a surgical procedure on her sinuses on October 19. Specimens were negative for bacteria, fungi, or AFB. She sought additional consultation with an alternative medicine provider. She had an exposure to black mold back in 2009 and dates most of her symptoms subsequent to that exposure. She tested positive for the presence of  "mold" in her urine. She was started on amphotericin B. She only received 1 dose since it caused significant toxicity. She was put on vitamin C, niacin, multiple vitamins, and acidophilus and a another herbal antifungal medication and does feel that her neuropathy has improved 59%.  There does appear to be a trend over the last year for progressive decrease in her total white count although the differential remains normal and she has not developed anemia or thrombocytopenia. She has not had problems with recurrent infection.  She is still taking effexor, a drug she has been on for over 20 years.    Medications:Current outpatient prescriptions: acidophilus (RISAQUAD) CAPS, Take 1 capsule by mouth 2 (two) times daily. , Disp: , Rfl: ;  amoxicillin (AMOXIL) 875 MG tablet, Take 1 tablet (875 mg total) by mouth 2 (two) times daily., Disp: 10 tablet, Rfl: 0;  CALCIUM PO, Take 1 tablet by mouth daily., Disp: , Rfl: ;  cholecalciferol (VITAMIN D) 1000 UNITS tablet, Take 5,000 Units by mouth daily. , Disp: , Rfl:  CYANOCOBALAMIN PO, Take 1 tablet by mouth daily., Disp: , Rfl: ;  Digestive Enzymes (DIGESTIVE ENZYME PO), Take 1 tablet by mouth 3 (three) times daily., Disp: , Rfl: ;  estradiol (VIVELLE-DOT) 0.0375 MG/24HR, Place 1 patch onto the skin 2 (two) times a week. 1/2 patch, Disp: , Rfl: ;  levothyroxine (SYNTHROID, LEVOTHROID) 25 MCG tablet, Take 25 mcg by mouth daily before breakfast. , Disp: , Rfl:  MAGNESIUM PO, Take 1 tablet by mouth daily., Disp: , Rfl: ;  Multiple Vitamin (MULTIVITAMIN WITH MINERALS) TABS, Take 1 tablet by mouth daily., Disp: , Rfl: ;  Multiple Vitamins-Iron (CHLORELLA PO), Take 1 tablet by  mouth 2 (two) times daily., Disp: , Rfl: ;  Niacin (VITAMIN B-3 PO), Take 1 tablet by mouth daily., Disp: , Rfl:  oxyCODONE-acetaminophen (ROXICET) 5-325 MG per tablet, Take 1 tablet by mouth every 4 (four) hours as needed for moderate pain or severe pain., Disp: 15 tablet, Rfl: 0;   Progesterone 100 MG CAPS, Take 0.25 tablets by mouth daily. , Disp: , Rfl: ;  venlafaxine (EFFEXOR) 37.5 MG tablet, Take 37.5 mg by mouth at bedtime., Disp: , Rfl: ;  vitamin C (ASCORBIC ACID) 500 MG tablet, Take 500 mg by mouth daily., Disp: , Rfl:   Allergies: No Known Allergies  Review of Systems: See history of present illness  Remaining ROS negative:   Physical Exam: Blood pressure 99/60, pulse 64, temperature 97.6 F (36.4 C), temperature source Oral, height '5\' 9"'  (1.753 m), weight 129 lb 8 oz (58.741 kg), SpO2 98 %. Wt Readings from Last 3 Encounters:  08/08/14 129 lb 8 oz (58.741 kg)  06/12/14 128 lb 6 oz (58.231 kg)  03/23/14 130 lb 8 oz (59.194 kg)     General appearance: tall, thin, caucasian woman HENNT: Pharynx no erythema, exudate, mass, or ulcer. No thyromegaly or thyroid nodules Lymph nodes: No cervical, supraclavicular, or axillary lymphadenopathy Breasts: Lungs: Clear to auscultation, resonant to percussion throughout Heart: Regular rhythm, no murmur, no gallop, no rub, no click, no edema Abdomen: Soft, nontender, normal bowel sounds, no mass, no organomegaly Extremities: No edema, no calf tenderness Musculoskeletal: no joint deformities GU:  Vascular: Carotid pulses 2+, no bruits, Neurologic: Alert, oriented, PERRLA, cranial nerves grossly normal, motor strength 5 over 5, reflexes 1+ symmetric, upper body coordination normal, gait normal, sensation intact to vibration over the fingertips by tuning fork exam. Feet not examined. Skin: No rash or ecchymosis  Lab Results: CBC W/Diff  White count differential: 62% neutrophils, 30% lymphocytes, 6% monocytes, 1% eosinophils, 1% basophils   Component Value Date/Time   WBC 2.4* 08/01/2014 0914   WBC 3.0* 03/15/2013 1144   RBC 4.51 08/01/2014 0914   RBC 4.59 03/15/2013 1144   HGB 14.0 08/01/2014 0914   HGB 13.7 03/15/2013 1144   HCT 41.0 08/01/2014 0914   HCT 40.8 03/15/2013 1144   PLT 197 08/01/2014 0914   PLT  210 03/15/2013 1144   MCV 90.9 08/01/2014 0914   MCV 88.9 03/15/2013 1144   MCH 31.0 08/01/2014 0914   MCH 29.9 03/15/2013 1144   MCHC 34.1 08/01/2014 0914   MCHC 33.6 03/15/2013 1144   RDW 12.8 08/01/2014 0914   RDW 13.8 03/15/2013 1144   LYMPHSABS 0.7 08/01/2014 0914   LYMPHSABS 1.0 03/15/2013 1144   MONOABS 0.1 08/01/2014 0914   MONOABS 0.3 03/15/2013 1144   EOSABS 0.0 08/01/2014 0914   EOSABS 0.0 03/15/2013 1144   BASOSABS 0.0 08/01/2014 0914   BASOSABS 0.1 03/15/2013 1144     Chemistry      Component Value Date/Time   NA 141 12/26/2013 1249   NA 140 05/24/2012 1053   K 4.2 12/26/2013 1249   K 4.1 05/24/2012 1053   CL 104 12/26/2013 1249   CL 106 05/24/2012 1053   CO2 27 12/26/2013 1249   CO2 27 05/24/2012 1053   BUN 9 12/26/2013 1249   BUN 13.0 05/24/2012 1053   CREATININE 0.59 12/26/2013 1249   CREATININE 0.62 09/13/2013 1030   CREATININE 0.7 05/24/2012 1053      Component Value Date/Time   CALCIUM 9.8 12/26/2013 1249   CALCIUM 9.5 05/24/2012 1053  ALKPHOS 69 12/26/2013 1249   ALKPHOS 65 05/24/2012 1053   AST 22 12/26/2013 1249   AST 29 05/24/2012 1053   ALT 9 12/26/2013 1249   ALT 13 05/24/2012 1053   BILITOT 0.5 12/26/2013 1249   BILITOT 0.50 05/24/2012 1053     Review of the peripheral blood film: Normochromic normocytic red cells. Neutrophils appear mature in lobation and granulation. Lymphocytes mature. No early myeloid or lymphoid cells. Occasional benign reactive lymphocyte. Normal platelets.  Radiological Studies: No results found.  Impression:  Chronic, idiopathic, neutropenia.  Extensive evaluation for immune and nonimmune causes negative to date. There appears to be a trend for progressive decrease in her white count over the last year. We again discussed the risk versus benefit of doing a bone marrow biopsy. We decided to repeat a CBC again next month and if white count trend continues to show a decline, then to proceed with a bone marrow  aspiration and biopsy. I still have some concerns that her isolated leukopenia may be related to chronic use of effexor and I again recommended that she try to taper herself off this medication and substituted with another antidepressant in a different drug class. Looking back over her evaluation, one thing that has not been checked is a Lyme disease titer. She did live in [place] for a while. I'm going to go ahead and check this at this time.   CC: Patient Care Team: Irven Shelling, MD as PCP - General Annia Belt, MD as Consulting Physician (Oncology) Alda Berthold, DO as Consulting Physician (Neurology)   Annia Belt, MD 12/15/20151:36 PM

## 2014-08-08 NOTE — Patient Instructions (Signed)
To lab today to check Lyme disease antibodies Return on January 19 to repeat regular blood count MD visIt in February

## 2014-08-09 LAB — B. BURGDORFI ANTIBODIES: B burgdorferi Ab IgG+IgM: 0.31 {ISR}

## 2014-08-11 ENCOUNTER — Telehealth: Payer: Self-pay | Admitting: *Deleted

## 2014-08-11 NOTE — Telephone Encounter (Signed)
-----   Message from Annia Belt, MD sent at 08/09/2014 11:00 AM EST ----- Call pt: lyme disease antibodies not detected

## 2014-08-11 NOTE — Telephone Encounter (Signed)
Pt called - no answer. Left message "lyme disease antibodies not detected" per [place]. And to call if she has any questions.

## 2014-09-12 ENCOUNTER — Other Ambulatory Visit (INDEPENDENT_AMBULATORY_CARE_PROVIDER_SITE_OTHER): Payer: BLUE CROSS/BLUE SHIELD

## 2014-09-12 DIAGNOSIS — D72819 Decreased white blood cell count, unspecified: Secondary | ICD-10-CM

## 2014-09-12 LAB — CBC WITH DIFFERENTIAL/PLATELET
Basophils Absolute: 0 10*3/uL (ref 0.0–0.1)
Basophils Relative: 1 % (ref 0–1)
Eosinophils Absolute: 0 10*3/uL (ref 0.0–0.7)
Eosinophils Relative: 1 % (ref 0–5)
HCT: 42 % (ref 36.0–46.0)
Hemoglobin: 14.1 g/dL (ref 12.0–15.0)
Lymphocytes Relative: 33 % (ref 12–46)
Lymphs Abs: 1 10*3/uL (ref 0.7–4.0)
MCH: 29.6 pg (ref 26.0–34.0)
MCHC: 33.6 g/dL (ref 30.0–36.0)
MCV: 88.2 fL (ref 78.0–100.0)
MPV: 10.4 fL (ref 8.6–12.4)
Monocytes Absolute: 0.4 10*3/uL (ref 0.1–1.0)
Monocytes Relative: 12 % (ref 3–12)
Neutro Abs: 1.6 10*3/uL — ABNORMAL LOW (ref 1.7–7.7)
Neutrophils Relative %: 53 % (ref 43–77)
Platelets: 253 10*3/uL (ref 150–400)
RBC: 4.76 MIL/uL (ref 3.87–5.11)
RDW: 13.7 % (ref 11.5–15.5)
WBC: 3.1 10*3/uL — ABNORMAL LOW (ref 4.0–10.5)

## 2014-09-14 ENCOUNTER — Telehealth: Payer: Self-pay | Admitting: *Deleted

## 2014-09-14 NOTE — Telephone Encounter (Signed)
Pt called - no answer. Left message "WBC back up to 2.4 from to 3.1 and bone marrow remains optional" per Dr Beryle Beams. Also left message if she would like Dr Darnell Level to call her, to call me back And let me know.

## 2014-09-14 NOTE — Telephone Encounter (Signed)
-----   Message from Annia Belt, MD sent at 09/13/2014 11:09 AM EST ----- Call pt: Megan [name] back to her baseline: up from 2,400 to 3,100.  Bone marrow remains optional. I can call to discuss w her if she would like DrG

## 2014-09-18 ENCOUNTER — Encounter: Payer: Self-pay | Admitting: Oncology

## 2014-09-18 NOTE — Progress Notes (Signed)
Patient ID: Megan [name], female   DOB: Apr 08, 1956, 59 y.o.   MRN: [id number] Phone conversation with the patient this evening to discuss recent repeat CBC which shows that her white count has returned to her previous baseline after an initial trend to be decreasing. She also dropped off extensive laboratory work done by an integrative medicine physician that she is seeing, Dr. Mylinda Latina. He checked a number of different infectious agents including EBV, Bartonella, Erlichia, Babesia, mycoplasma, [facility] spotted fever, RPR, and although there is evidence for previous exposure to mycoplasma, IgM titers are undetectable and none of the other infectious agents are showing positive. A C reactive protein was not elevated. A ratio of CD8 negative to CD57 positive lymphocytes was checked and reporting is discrepant. 2 values are reported:  a percentage, 3.6%, which is in the normal range of this test (2-17 percent), and an absolute #: 25/microliter with a reference range of 60-360. A disclaimer on the report says that this test has not been cleared or approved by the FDA. She was told that the results of this test suggested that she might have Lyme disease and treatment of Lyme disease was recommended. I told her I am not sure that I would rely on this one test to make that diagnosis. Once again we discussed the risk versus benefit of doing a bone marrow aspiration and biopsy. I feel this remains a low yield procedure and would be totally optional. Since her white count has returned to her baseline, she would like to wait another 6 months and reevaluate the situation in June. I will go ahead and reschedule her February appointment until then and get lab a week ahead.

## 2014-09-20 ENCOUNTER — Other Ambulatory Visit: Payer: Self-pay | Admitting: Oncology

## 2014-09-20 DIAGNOSIS — D72819 Decreased white blood cell count, unspecified: Secondary | ICD-10-CM

## 2014-10-03 ENCOUNTER — Ambulatory Visit: Payer: BC Managed Care – PPO | Admitting: Oncology

## 2014-10-04 ENCOUNTER — Other Ambulatory Visit: Payer: Self-pay | Admitting: Dermatology

## 2015-01-10 ENCOUNTER — Ambulatory Visit: Payer: BLUE CROSS/BLUE SHIELD | Admitting: Sports Medicine

## 2015-01-29 ENCOUNTER — Other Ambulatory Visit (INDEPENDENT_AMBULATORY_CARE_PROVIDER_SITE_OTHER): Payer: BLUE CROSS/BLUE SHIELD

## 2015-01-29 DIAGNOSIS — D72819 Decreased white blood cell count, unspecified: Secondary | ICD-10-CM

## 2015-01-29 LAB — CBC WITH DIFFERENTIAL/PLATELET
Basophils Absolute: 0 10*3/uL (ref 0.0–0.1)
Basophils Relative: 1 % (ref 0–1)
Eosinophils Absolute: 0.1 10*3/uL (ref 0.0–0.7)
Eosinophils Relative: 2 % (ref 0–5)
HCT: 38.3 % (ref 36.0–46.0)
Hemoglobin: 13 g/dL (ref 12.0–15.0)
Lymphocytes Relative: 27 % (ref 12–46)
Lymphs Abs: 1 10*3/uL (ref 0.7–4.0)
MCH: 29.5 pg (ref 26.0–34.0)
MCHC: 33.9 g/dL (ref 30.0–36.0)
MCV: 86.8 fL (ref 78.0–100.0)
MPV: 9.7 fL (ref 8.6–12.4)
Monocytes Absolute: 0.3 10*3/uL (ref 0.1–1.0)
Monocytes Relative: 9 % (ref 3–12)
Neutro Abs: 2.3 10*3/uL (ref 1.7–7.7)
Neutrophils Relative %: 61 % (ref 43–77)
Platelets: 212 10*3/uL (ref 150–400)
RBC: 4.41 MIL/uL (ref 3.87–5.11)
RDW: 13.6 % (ref 11.5–15.5)
WBC: 3.8 10*3/uL — ABNORMAL LOW (ref 4.0–10.5)

## 2015-01-29 LAB — SAVE SMEAR

## 2015-02-05 ENCOUNTER — Encounter: Payer: Self-pay | Admitting: Oncology

## 2015-02-05 ENCOUNTER — Ambulatory Visit (INDEPENDENT_AMBULATORY_CARE_PROVIDER_SITE_OTHER): Payer: BLUE CROSS/BLUE SHIELD | Admitting: Oncology

## 2015-02-05 VITALS — BP 108/60 | HR 66 | Temp 97.5°F | Ht 69.5 in | Wt 133.1 lb

## 2015-02-05 DIAGNOSIS — D72819 Decreased white blood cell count, unspecified: Secondary | ICD-10-CM

## 2015-02-05 DIAGNOSIS — G609 Hereditary and idiopathic neuropathy, unspecified: Secondary | ICD-10-CM

## 2015-02-05 DIAGNOSIS — G629 Polyneuropathy, unspecified: Secondary | ICD-10-CM

## 2015-02-05 NOTE — Progress Notes (Signed)
Patient ID: Megan [name], female   DOB: June 02, 1956, 59 y.o.   MRN: [id number] Hematology and Oncology Follow Up Visit  Monicia Tse [id number] 19-Jan-1956 59 y.o. 02/05/2015 6:01 PM   Principle Diagnosis: Encounter Diagnoses  Name Primary?  . Leukopenia Yes  . Peripheral neuropathy   Clinical Summary: 59 year old woman.initially referred here in October 2013 to evaluate chronic neutropenia and idiopathic distal neuropathy affecting her feet. An extensive evaluation to date including myeloma screen, screen for collagen vascular disorders, screen for bone marrow suppressive viral agents, toxic drug screen including lead levels, were all unrevealing. A single spot urine specimen was positive for the presence of monoclonal lambda immunoglobulin. However when I repeated a 24-hour urine study in June of 2015, there was no proteinuria and no monoclonal proteins. She tested negative 2 for the presence of cryoglobulins. Normal ceruloplasmin level. She has a decrease in total serum IgG level without any monoclonal proteins in the serum. Other than the chronic leukopenia with a normal white count differential, she has never developed anemia or thrombocytopenia. She has no adenopathy or organomegaly on exam. She has never developed any progressive constitutional symptoms. Neuropathy remains limited to her feet. She had partial relief of her symptoms on a Vegan diet.  She has had a surgical procedure on her sinuses on June 12, 2014. Specimens were negative for bacteria, fungi, or AFB. She sought additional consultation with an alternative medicine provider. She had an exposure to black mold back in 2009 and dates most of her symptoms subsequent to that exposure. She tested positive for the presence of "mold" in her urine. She was started on amphotericin B. She only received 1 dose since it caused significant toxicity. She was put on vitamin C, niacin, multiple vitamins, and acidophilus and a another  herbal antifungal medication and does feel that her neuropathy has improved about 50%.  There does appear to be a trend over the last year for progressive decrease in her total white count although the differential remains normal and she has not developed anemia or thrombocytopenia. She has not had problems with recurrent infection.   Interim History:   She believes that all the changes she has made in her diet are probably starting to help. She has experienced a significant improvement in the pain and burning of her feet. No interim medical problems. 2 episodes of simple bronchitis since last visit with me. No other infections.  Medications: reviewed  Allergies: No Known Allergies  Review of Systems: See history of present illness. Remaining ROS negative:   Physical Exam: Blood pressure 108/60, pulse 66, temperature 97.5 F (36.4 C), temperature source Oral, height 5' 9.5" (1.765 m), weight 133 lb 1.6 oz (60.374 kg), SpO2 100 %. Wt Readings from Last 3 Encounters:  02/05/15 133 lb 1.6 oz (60.374 kg)  08/08/14 129 lb 8 oz (58.741 kg)  06/12/14 128 lb 6 oz (58.231 kg)     General appearance: Thin Caucasian woman HENNT: Pharynx no erythema, exudate, mass, or ulcer. No thyromegaly or thyroid nodules Lymph nodes: No cervical, supraclavicular, or axillary lymphadenopathy Breasts:  Lungs: Clear to auscultation, resonant to percussion throughout Heart: Regular rhythm, no murmur, no gallop, no rub, no click, no edema Abdomen: Not examined Extremities: No edema, no calf tenderness Musculoskeletal: no joint deformities GU:  Vascular: Carotid pulses 2+, no bruits, distal pulses 1+ symmetric dorsalis pedis Neurologic: Alert, oriented, PERRLA,  cranial nerves grossly normal, motor strength 5 over 5, reflexes 1+ symmetric, upper body coordination normal, gait normal,  vibration sensation intact over the fingertips. Moderately decreased over the feet. Skin: No rash or ecchymosis  Lab  Results: CBC W/Diff    Component Value Date/Time   WBC 3.8* 01/29/2015 1454   WBC 3.0* 03/15/2013 1144   RBC 4.41 01/29/2015 1454   RBC 4.59 03/15/2013 1144   HGB 13.0 01/29/2015 1454   HGB 13.7 03/15/2013 1144   HCT 38.3 01/29/2015 1454   HCT 40.8 03/15/2013 1144   PLT 212 01/29/2015 1454   PLT 210 03/15/2013 1144   MCV 86.8 01/29/2015 1454   MCV 88.9 03/15/2013 1144   MCH 29.5 01/29/2015 1454   MCH 29.9 03/15/2013 1144   MCHC 33.9 01/29/2015 1454   MCHC 33.6 03/15/2013 1144   RDW 13.6 01/29/2015 1454   RDW 13.8 03/15/2013 1144   LYMPHSABS 1.0 01/29/2015 1454   LYMPHSABS 1.0 03/15/2013 1144   MONOABS 0.3 01/29/2015 1454   MONOABS 0.3 03/15/2013 1144   EOSABS 0.1 01/29/2015 1454   EOSABS 0.0 03/15/2013 1144   BASOSABS 0.0 01/29/2015 1454   BASOSABS 0.1 03/15/2013 1144     Chemistry      Component Value Date/Time   NA 141 12/26/2013 1249   NA 140 05/24/2012 1053   K 4.2 12/26/2013 1249   K 4.1 05/24/2012 1053   CL 104 12/26/2013 1249   CL 106 05/24/2012 1053   CO2 27 12/26/2013 1249   CO2 27 05/24/2012 1053   BUN 9 12/26/2013 1249   BUN 13.0 05/24/2012 1053   CREATININE 0.59 12/26/2013 1249   CREATININE 0.62 09/13/2013 1030   CREATININE 0.7 05/24/2012 1053      Component Value Date/Time   CALCIUM 9.8 12/26/2013 1249   CALCIUM 9.5 05/24/2012 1053   ALKPHOS 69 12/26/2013 1249   ALKPHOS 65 05/24/2012 1053   AST 22 12/26/2013 1249   AST 29 05/24/2012 1053   ALT 9 12/26/2013 1249   ALT 13 05/24/2012 1053   BILITOT 0.5 12/26/2013 1249   BILITOT 0.50 05/24/2012 1053    White count differential: 01/29/2015. Total white count 3800, 61% neutrophils, 27 lymphocytes, 9 monocytes, 2 eosinophils, 1 basophil. Review of the peripheral blood film: Normochromic normocytic red cells. Mature neutrophils and lymphocytes.   Radiological Studies: No results found.  Impression:  #1. Chronic, idiopathic, leukopenia which has fluctuated over time but no progressive decline.  Normal white count differential. Mature cells on review of the peripheral blood film. We have discussed the value of doing a bone marrow biopsy on a number of occasions in the past. Current white count has rebounded to a higher value than previous 2 values. If anything her clinical symptoms have improved and her exam remains stable. I told her I did not think a bone marrow biopsy would add any insight into her problem at this time. I will continue to see her on an annual basis.  #2. Idiopathic distal peripheral neuropathy Some recent improvement.   CC: Patient Care Team: Lavone Orn, MD as PCP - General Annia Belt, MD as Consulting Physician (Oncology) Alda Berthold, DO as Consulting Physician (Neurology)   Annia Belt, MD 6/13/20166:01 PM

## 2015-02-05 NOTE — Patient Instructions (Signed)
Return visit 1 year Lab 1 week before visit

## 2015-03-25 ENCOUNTER — Emergency Department (HOSPITAL_COMMUNITY): Payer: BLUE CROSS/BLUE SHIELD

## 2015-03-25 ENCOUNTER — Emergency Department (HOSPITAL_COMMUNITY)
Admission: EM | Admit: 2015-03-25 | Discharge: 2015-03-25 | Disposition: A | Discharge status: Home / Self Care | Payer: BLUE CROSS/BLUE SHIELD | Attending: Emergency Medicine | Admitting: Emergency Medicine

## 2015-03-25 ENCOUNTER — Encounter (HOSPITAL_COMMUNITY): Payer: Self-pay | Admitting: Family Medicine

## 2015-03-25 DIAGNOSIS — Z79899 Other long term (current) drug therapy: Secondary | ICD-10-CM | POA: Diagnosis not present

## 2015-03-25 DIAGNOSIS — J45909 Unspecified asthma, uncomplicated: Secondary | ICD-10-CM | POA: Diagnosis not present

## 2015-03-25 DIAGNOSIS — R55 Syncope and collapse: Secondary | ICD-10-CM | POA: Diagnosis present

## 2015-03-25 DIAGNOSIS — E039 Hypothyroidism, unspecified: Secondary | ICD-10-CM | POA: Insufficient documentation

## 2015-03-25 DIAGNOSIS — R42 Dizziness and giddiness: Secondary | ICD-10-CM | POA: Diagnosis not present

## 2015-03-25 DIAGNOSIS — Z8739 Personal history of other diseases of the musculoskeletal system and connective tissue: Secondary | ICD-10-CM | POA: Insufficient documentation

## 2015-03-25 DIAGNOSIS — R079 Chest pain, unspecified: Secondary | ICD-10-CM | POA: Diagnosis not present

## 2015-03-25 LAB — URINALYSIS, ROUTINE W REFLEX MICROSCOPIC
Bilirubin Urine: NEGATIVE
Glucose, UA: NEGATIVE mg/dL
Hgb urine dipstick: NEGATIVE
Ketones, ur: NEGATIVE mg/dL
Leukocytes, UA: NEGATIVE
Nitrite: NEGATIVE
Protein, ur: NEGATIVE mg/dL
Specific Gravity, Urine: 1.009 (ref 1.005–1.030)
Urobilinogen, UA: 0.2 mg/dL (ref 0.0–1.0)
pH: 7 (ref 5.0–8.0)

## 2015-03-25 LAB — BASIC METABOLIC PANEL
Anion gap: 9 (ref 5–15)
BUN: 11 mg/dL (ref 6–20)
CO2: 25 mmol/L (ref 22–32)
Calcium: 9 mg/dL (ref 8.9–10.3)
Chloride: 104 mmol/L (ref 101–111)
Creatinine, Ser: 0.63 mg/dL (ref 0.44–1.00)
GFR calc Af Amer: >60 mL/min (ref >60)
GFR calc non Af Amer: >60 mL/min (ref >60)
Glucose, Bld: 81 mg/dL (ref 65–99)
Potassium: 4 mmol/L (ref 3.5–5.1)
Sodium: 138 mmol/L (ref 135–145)

## 2015-03-25 LAB — CBC
HCT: 41.4 % (ref 36.0–46.0)
Hemoglobin: 14.3 g/dL (ref 12.0–15.0)
MCH: 30.5 pg (ref 26.0–34.0)
MCHC: 34.5 g/dL (ref 30.0–36.0)
MCV: 88.3 fL (ref 78.0–100.0)
Platelets: 188 10*3/uL (ref 150–400)
RBC: 4.69 MIL/uL (ref 3.87–5.11)
RDW: 12.7 % (ref 11.5–15.5)
WBC: 2.6 10*3/uL — ABNORMAL LOW (ref 4.0–10.5)

## 2015-03-25 LAB — I-STAT TROPONIN, ED: Troponin i, poc: 0 ng/mL (ref 0.00–0.08)

## 2015-03-25 MED ORDER — SODIUM CHLORIDE 0.9 % IV BOLUS (SEPSIS)
1000.0000 mL | Freq: Once | INTRAVENOUS | Status: AC
  Administered 2015-03-25: 1000 mL via INTRAVENOUS

## 2015-03-25 NOTE — Discharge Instructions (Signed)
Near-Syncope Near-syncope (commonly known as near fainting) is sudden weakness, dizziness, or feeling like you might pass out. During an episode of near-syncope, you may also develop pale skin, have tunnel vision, or feel sick to your stomach (nauseous). Near-syncope may occur when getting up after sitting or while standing for a long time. It is caused by a sudden decrease in blood flow to the brain. This decrease can result from various causes or triggers, most of which are not serious. However, because near-syncope can sometimes be a sign of something serious, a medical evaluation is required. The specific cause is often not determined. HOME CARE INSTRUCTIONS  Monitor your condition for any changes. The following actions may help to alleviate any discomfort you are experiencing:  Have someone stay with you until you feel stable.  Lie down right away and prop your feet up if you start feeling like you might faint. Breathe deeply and steadily. Wait until all the symptoms have passed. Most of these episodes last only a few minutes. You may feel tired for several hours.   Drink enough fluids to keep your urine clear or pale yellow.   If you are taking blood pressure or heart medicine, get up slowly when seated or lying down. Take several minutes to sit and then stand. This can reduce dizziness.  Follow up with your health care provider as directed. SEEK IMMEDIATE MEDICAL CARE IF:   You have a severe headache.   You have unusual pain in the chest, abdomen, or back.   You are bleeding from the mouth or rectum, or you have black or tarry stool.   You have an irregular or very fast heartbeat.   You have repeated fainting or have seizure-like jerking during an episode.   You faint when sitting or lying down.   You have confusion.   You have difficulty walking.   You have severe weakness.   You have vision problems.  MAKE SURE YOU:   Understand these instructions.  Will  watch your condition.  Will get help right away if you are not doing well or get worse. Document Released: 08/11/2005 Document Revised: 08/16/2013 Document Reviewed: 01/14/2013 Exit[employer] Patient Information 2015 [employer]. This information is not intended to replace advice given to you by your health care provider. Make sure you discuss any questions you have with your health care provider.  

## 2015-03-25 NOTE — ED Notes (Signed)
Pt here for light headed, dizziness, chest pain, SOB since yesterday. sts cramping feeling on left side.

## 2015-03-25 NOTE — ED Provider Notes (Signed)
CSN: [id number]     Arrival date & time 03/25/15  1141 History   First MD Initiated Contact with Patient 03/25/15 1201     Chief Complaint  Patient presents with  . Near Syncope  . Chest Pain  . Dizziness     (Consider location/radiation/quality/duration/timing/severity/associated sxs/prior Treatment) HPI [name] is a 59 y.o. female with past medical history of mitral valve prolapse, depression, peripheral neuropathy, hypothyroidism presenting to the emergency department today with the complaint of chest pain, dizziness, and near syncope.  Patient states that she went to the gym yesterday and while walking her dog afterwards, she became dizzy and lightheaded.  States she returned home to drink water which minimally relieved her symptoms.  States symptoms of dizziness and being lightheaded persisted through the evening while lying in bed and was worsened with ambulation.  She also reports some left sided chest pain that is reproducible with palpation.  Currently not having any chest pain but does endorse continued dizziness.  Denies any n/v/d, urinary complaints.  No shortness of breath, feeling of vertigo. Past Medical History  Diagnosis Date  . MVP (mitral valve prolapse)   . Depression   . Orthostatic hypotension   . Peripheral neuropathy 06/09/2012  . Hypothyroidism   . Pneumothorax   . Asthma   . Osteoporosis   . Plantar fasciitis    Past Surgical History  Procedure Laterality Date  . Tonsillectomy  1962  . Mouth surgery  01/2012    bone graft in mouth  . Colonoscopy with propofol N/A 02/22/2013    Procedure: COLONOSCOPY WITH PROPOFOL;  Surgeon: Garlan Fair, MD;  Location: [facility];  Service: Endoscopy;  Laterality: N/A;  . Root canal  02/27/2012  . Nasal sinus surgery Right 06/12/2014    Procedure: RIGHT ENDOSCOPIC MAXILLARY ANTROSTOMY;  Surgeon: Ascencion Dike, MD;  Location: [facility];  Service: ENT;  Laterality: Right;   Family History   Problem Relation Age of Onset  . Hyperlipidemia Father   . Heart failure Father   . Prostate cancer Father   . Kidney failure Father   . Angina Mother   . Hypertension Mother   . Lung cancer Mother   . Lung cancer Paternal Grandfather   . Breast cancer Paternal Grandmother   . Diabetes Paternal Grandmother   . Hypertension Father   . Colon cancer Mother    History  Substance Use Topics  . Smoking status: Never Smoker   . Smokeless tobacco: Never Used  . Alcohol Use: No   OB History    No data available     Review of Systems  Constitutional: Negative for fever, chills and fatigue.  Respiratory: Negative for cough, chest tightness and shortness of breath.   Cardiovascular: Negative for chest pain and palpitations.  Gastrointestinal: Negative for nausea, vomiting, abdominal pain, diarrhea and constipation.  Genitourinary: Negative for dysuria and frequency.  Neurological: Positive for dizziness, weakness and light-headedness. Negative for syncope, speech difficulty and numbness.      Allergies  Review of patient's allergies indicates no known allergies.  Home Medications   Prior to Admission medications   Medication Sig Start Date End Date Taking? Authorizing Provider  acidophilus (RISAQUAD) CAPS Take 1 capsule by mouth 2 (two) times daily.     Historical Provider, MD  CALCIUM PO Take 1 tablet by mouth daily.    Historical Provider, MD  cholecalciferol (VITAMIN D) 1000 UNITS tablet Take 5,000 Units by mouth daily.  Historical Provider, MD  CYANOCOBALAMIN PO Take 1 tablet by mouth daily.    Historical Provider, MD  Digestive Enzymes (DIGESTIVE ENZYME PO) Take 1 tablet by mouth 3 (three) times daily.    Historical Provider, MD  estradiol (VIVELLE-DOT) 0.0375 MG/24HR Place 1 patch onto the skin 2 (two) times a week. 1/2 patch    Historical Provider, MD  levothyroxine (SYNTHROID, LEVOTHROID) 25 MCG tablet Take 25 mcg by mouth daily before breakfast.     Historical  Provider, MD  MAGNESIUM PO Take 1 tablet by mouth daily.    Historical Provider, MD  Multiple Vitamin (MULTIVITAMIN WITH MINERALS) TABS Take 1 tablet by mouth daily.    Historical Provider, MD  Multiple Vitamins-Iron (CHLORELLA PO) Take 1 tablet by mouth 2 (two) times daily.    Historical Provider, MD  Niacin (VITAMIN B-3 PO) Take 1 tablet by mouth daily.    Historical Provider, MD  oxyCODONE-acetaminophen (ROXICET) 5-325 MG per tablet Take 1 tablet by mouth every 4 (four) hours as needed for moderate pain or severe pain. 06/12/14   Leta Baptist, MD  Progesterone 100 MG CAPS Take 0.25 tablets by mouth daily.     Historical Provider, MD  venlafaxine (EFFEXOR) 37.5 MG tablet Take 37.5 mg by mouth at bedtime.    Historical Provider, MD  vitamin C (ASCORBIC ACID) 500 MG tablet Take 500 mg by mouth daily.    Historical Provider, MD   BP 115/70 mmHg  Pulse 63  Temp(Src) 98.2 F (36.8 C) (Oral)  Resp 16  SpO2 100% Physical Exam  Constitutional: She is oriented to person, place, and time. She appears well-developed and well-nourished. No distress.  HENT:  Head: Normocephalic and atraumatic.  Eyes: EOM are normal.  Cardiovascular: Normal rate, regular rhythm, normal heart sounds and intact distal pulses.   No murmur heard. Pulmonary/Chest: Effort normal and breath sounds normal. She has no wheezes. She has no rales.  Abdominal: Soft. Bowel sounds are normal. There is no tenderness.  Musculoskeletal: She exhibits no edema.  Neurological: She is alert and oriented to person, place, and time. She has normal strength. No cranial nerve deficit.  Skin: Skin is warm and intact. She is not diaphoretic.  Psychiatric: She has a normal mood and affect. Her speech is normal and behavior is normal.    ED Course  Procedures (including critical care time) Labs Review Labs Reviewed  BASIC METABOLIC PANEL  CBC  URINALYSIS, ROUTINE W REFLEX MICROSCOPIC (NOT AT [facility])    Imaging Review No results found.    EKG Interpretation   Date/Time:  Sunday March 25 2015 11:55:48 EDT Ventricular Rate:  59 PR Interval:  159 QRS Duration: 87 QT Interval:  440 QTC Calculation: 436 R Axis:   92 Text Interpretation:  Sinus rhythm Right axis deviation Flattened T wave  in V2, new Confirmed by Mingo Amber  MD, Ashby ([id number]) on 03/25/2015 12:21:23 PM      MDM   Final diagnoses:  None   59 y.o. female with dizziness, near syncope, and chest pain since yesterday.  Will check CBC, BMP, chest xray, urinalysis.    Chest pain left sided and reproducible with normal EKG and initial troponin negative. Low HEART score of 1 point.  With persistent dizziness and near syncope that reports worse with ambulation, will obtain orthostatic vitals, MRI brain without contrast.    1:24pm - BMP with normal electrolytes, no anion gap, glucose 81.  CBC with leukopenia that appears chronic from prior PCP visits and labs.  Otherwise unremarkable with H/H of 14.3/41.4. Chest xray read with no active cardiopulmonary process. 3:02pm - normal orthostatic vital signs, urinalysis negative for nitrite, leukocytes, glucose.  MRI brain without contrast read with no acute intracranial abnormality or significant interval change.  Will give pt 1L IV fluids and plan for follow up outpatient with neurology given stable findings on MRI and return to ED if symptoms worsen.   Jule Ser, DO 03/25/15 [place], MD 03/25/15 [phone]

## 2015-04-05 ENCOUNTER — Telehealth: Payer: Self-pay | Admitting: Neurology

## 2015-04-05 ENCOUNTER — Other Ambulatory Visit: Payer: Self-pay | Admitting: *Deleted

## 2015-04-05 ENCOUNTER — Other Ambulatory Visit: Payer: BLUE CROSS/BLUE SHIELD

## 2015-04-05 ENCOUNTER — Ambulatory Visit (INDEPENDENT_AMBULATORY_CARE_PROVIDER_SITE_OTHER): Payer: BLUE CROSS/BLUE SHIELD | Admitting: Neurology

## 2015-04-05 ENCOUNTER — Encounter: Payer: Self-pay | Admitting: Neurology

## 2015-04-05 VITALS — BP 100/80 | HR 65 | Ht 69.0 in | Wt 130.6 lb

## 2015-04-05 DIAGNOSIS — G5621 Lesion of ulnar nerve, right upper limb: Secondary | ICD-10-CM

## 2015-04-05 DIAGNOSIS — R209 Unspecified disturbances of skin sensation: Secondary | ICD-10-CM

## 2015-04-05 DIAGNOSIS — H539 Unspecified visual disturbance: Secondary | ICD-10-CM | POA: Diagnosis not present

## 2015-04-05 DIAGNOSIS — R42 Dizziness and giddiness: Secondary | ICD-10-CM

## 2015-04-05 NOTE — Patient Instructions (Signed)
1.  US carotids 2.  Check vitamin B12 3.  If you continue to feel lightheaded, start using compression stockings.  Be sure to stay well hydrated 4.  Return to clinic in 6 months

## 2015-04-05 NOTE — Progress Notes (Signed)
Follow-up Visit   Date: 04/05/2015    Megan [name] MRN: [id number] DOB: [birth year]   Interim History: Megan [name] is a 59 y.o. right-handed Caucasian female with history of osteoporosis, leukopenia, asthma, peripheral neuropathy of unknown etiology returning to the clinic for new complaints of dizziness.    History of present illness: She moved to [place] to help her parents in 2009. Of note, she reports finding out that the home she moved into there was a lot of mold and had to get the home.  In November 2009, she developed severe pneumonia and collapsed lung and was hospitalized at [facility]. She was not intubated during the stay. She was told she had adult onset asthma and contacted a holistic doctor who was able to wean her off inhalers. She was doing well until 2012, when she developed bilateral feet swelling and burning pain. She went to see Sports Medicine, Podiatry, and PT for possible plantar fascitiis. It improved transiently, but later that year, she developed cramping of the feet and discolaration. She even went to the Emergency Department on one occasion due due severe cyanosis of the feet and cold sensation. She was told to follow-up with her PCP who ordered lab testing and referred her to see [facility] for NCS/EMG which showed mild axonal peripheral neuropathy. She was being followed by Dr. Maureen Chatters who ordered additional labs and imaging of the brain, cervical and lumbar spine. Due to white matter changes seen on the brain and possibility of underlying vasculitis, she was given a two week trial of prednisone which did not change her symptoms. Labs were unrevealing (see below). Ultrasound of the lower extremity did not reveal any thrombus.   Her numbness and loss of balance has worsened over the years, but burning pain involving the soles of the feet has remained relatively unchanged. She also reports weakness of the legs. She was previously very active and went to the  gym 7-days per week, but now goes 4-5 days per week with lighter routines because she is unable to lift as much and has problems with her balance.   She sought a second opinion at [facility] and saw Dr. Elias Else in 2013. She underwent a second NCS/EMG which is interpreted as normal, although she was told she has neuropathy. Due to financial reasons, she did not followup at [facility] again. Autoimmune, inflammatory labs, as well as paraneoplastic panel was negative.   In October 2014, she started seeing Dr. Berdine Addison, neurologist, at [facility] who repeated NCS/EMG which was interpreted as sensorimotor peripheral neuropathy with axonal and demyelinating features.   From a symptomatic standpoint, she has been offered neuralgesic medications such as gabapentin, but she prefers to be on as little medication as possible so is not taking anything. Denies dry eyes, dry mouth, anhydrosis, or early satiety. She endorses light headedness. She was followed a vegan diet from 2013-2014 and recently added fish, chicken, and eggs to diet. She does not eat very much dairy and avoids gluten-products.   UPDATE 03/23/2014:  Her labs from the last visit showed positive for monoclonal lambda light chains.  Subsequent work-up by Dr. Beryle Beams in hematology/oncology did not show monoclonal gammopathy.  Her EMG did not show any evidence of peripheral neuropathy, there was a moderate right ulnar neuropathy, but denies any paresthesias of the right hand.  She continues to have burning pain, swelling, and discoloration of the skin.  She complains of dull retroorbital headache, associated with  photophobia. No nausea and vomiting.  Duration is typically all day, occuring about once per week.  She does not like to take medications, so is not anything for pain.  She feels that her eyes are swollen and has blurry vision.  She had her eyes checked which was normal.   She continues seek a lot of  alternative therapies with yoga, accupunture, integrative medication, and "energy" exercises.    UPDATE 04/05/2015:  Over the past year, much of her unsteadiness and pain has improved.  She attributes this to working with her holistic doctor and changing her diet.  She went to the emergency department with dizziness and chest pain.  MRI brain was stable. The severity of symptoms lasted 5 minutes, but she continues to have milder symptoms which persists.  She does not have any spinning sensation.  She does recall having a headaches over the same weekend.   Her eyes feel "greasy" and had her eyes checked.  She was given prescription for corrective lenses, but is not using them.   Medications:  Current Outpatient Prescriptions on File Prior to Visit  Medication Sig Dispense Refill  . cholecalciferol (VITAMIN D) 1000 UNITS tablet Take 2,000 Units by mouth 2 (two) times a week.     . CYANOCOBALAMIN PO Take 1 tablet by mouth once a week.     . Digestive Enzymes (DIGESTIVE ENZYME PO) Take 1 tablet by mouth 3 (three) times daily.    [name] estradiol (VIVELLE-DOT) 0.025 MG/24HR Place 0.5 patches onto the skin 2 (two) times a week.  4  . levothyroxine (SYNTHROID, LEVOTHROID) 25 MCG tablet Take 25 mcg by mouth daily before breakfast.     . magnesium oxide (MAG-OX) 400 MG tablet Take 400 mg by mouth 2 (two) times daily.    . Misc Natural Products (CHLORELLA) 500 MG CAPS Take 500 mg by mouth daily.    . Multiple Vitamin (MULTIVITAMIN WITH MINERALS) TABS Take 1 tablet by mouth daily.    [name] NATURE-THROID 32.5 MG tablet Take 32.5 mg by mouth daily.  3  . Progesterone 100 MG CAPS Take 0.25 tablets by mouth daily.     . STRONTIUM GLUCONATE-B6-B12-FA PO Take 1 capsule by mouth daily.    [name] venlafaxine (EFFEXOR) 37.5 MG tablet Take 37.5 mg by mouth at bedtime.    . vitamin C (ASCORBIC ACID) 500 MG tablet Take 2,000 mg by mouth 2 (two) times daily.      No current facility-administered medications on file prior to visit.     Allergies: No Known Allergies   Review of Systems:  CONSTITUTIONAL: No fevers, chills, night sweats, or weight loss.   EYES: No visual changes or eye pain ENT: No hearing changes.  No history of nose bleeds.   RESPIRATORY: No cough, wheezing and shortness of breath.   CARDIOVASCULAR: Negative for chest pain, and palpitations.   GI: Negative for abdominal discomfort, blood in stools or black stools.  No recent change in bowel habits.   GU:  No history of incontinence.   MUSCLOSKELETAL: + history of joint pain or swelling.  No myalgias.   SKIN: Negative for lesions, rash, and itching.   ENDOCRINE: Negative for cold or heat intolerance, polydipsia or goiter.   PSYCH:  No depression or anxiety symptoms.   NEURO: As Above.   Vital Signs:  BP 100/80 mmHg  Pulse 65  Ht 5\' 9"  (1.753 m)  Wt 130 lb 9 oz (59.223 kg)  BMI 19.27 kg/m2  SpO2 98%  Neurological  Exam: MENTAL STATUS including orientation to time, place, person, recent and remote memory, attention span and concentration, language, and fund of knowledge is normal.  Speech is not dysarthric.  CRANIAL NERVES: No visual field defects. Pupils equal round and reactive to light.  Normal conjugate, extra-ocular eye movements in all directions of gaze.  No ptosis. Normal facial sensation.  Face is symmetric. Palate elevates symmetrically.  Tongue is midline.  MOTOR:  Motor strength is 5/5 in all extremities.  No atrophy, fasciculations or abnormal movements.  No pronator drift.  Tone is normal.   MSRs:  Reflexes are 2+/4 throughout.  Plantars are down going.   SENSORY:  Intact to vibration, temperature, and light touch throughout.  COORDINATION/GAIT:  Gait narrow based and stable.  Tandem gait intact.   Data: MRI brain 05/16/2014: 1. No evidence of acute intracranial abnormality or mass.  2. Mild chronic small vessel ischemic disease.  3. Incidental pineal cyst, unchanged.  MRI brain 03/25/2015: 1. Stable scattered T2  hyperintensities, slightly advanced for age.  The finding is nonspecific but can be seen in the setting of chronic microvascular ischemia, a demyelinating process such as multiple sclerosis, vasculitis, complicated migraine headaches, or as the sequelae of a prior infectious or inflammatory process. 2. Stable benign appearing pineal cyst. 3. No acute intracranial abnormality or significant interval change.  Labs 12/06/2013:  2-hr glucose tolerance test, vitamin B12 932, vitamin B1 32, vitamin E 14.6, vitamin B6 29.9, zinc 87, copper 124, MMA 64*, folate >20, CRP <0.5, SPEP - no M protein, UPEP + monoclonal free Lambda light chains, ENA negative, ANA negative  Labs 10/25/2013: Sodium 143, potassium 4.2, calcium 9.5, AST 23, ALT 8, free T4 0.98, antithyroglobulin antibody negative, CRP 0.24, TPO antibody negative  Labs 09/14/2012: Lead 1.7   Labs 2013: cryoglobulin neg, ANA negative Copper 116, ANCA negative especially, C3 101, C4 25, VDRL negative, paraneoplastic autoantibody negative, UPEP no M protein, CMV, EBV, HIV, hep acute panel   MRI brain 01/29/2012: Mild nonspecific white matter hyperintensities in the subcortical regions. 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   MRI cervical spine with and without contrast 01/29/2012: Prominent spondylitic changes at C5-6 and C6-7 without significant compression.   MRI of the lumbar spine without contrast 03/18/2012: Mild disc degenerative changes from L3-S1 with severe left-sided foraminal narrowing at L3-4 and L4-5 without definite root impingement   EMG performed at [facility] 10/15/2011: Mild distal sensory axonal neuropathy.  EMG performed at [facility]: Normal study. No evidence of large fiber sensory motor neuropathy.  EMG performed at [facility] bilateral lower extremity 06/02/2013: Significant sensorimotor axonal and demyelinating  peripheral neuropathy. Upon my review, sensory and motor amplitudes are preserved the latency is normal. Late responses are prolonged (F-wave) and conduction velocity is borderline slow 38-42 m/s. Temperature limb was not recorded.  EMG performed at [facility] 03/14/2014:  1. Right ulnar neuropathy with slowing across the elbow, demyelinating and axon loss in type. Overall, these findings are moderate in degree electrically. 2. There is no evidence of a generalized sensorimotor polyradiculoneuropathy, cervical radiculopathy, or lumbosacral radiculopathy affecting the right side.    IMPRESSION/PLAN: Ms. Cornacchia is a 59 year old female who was seen her for peripheral neuropathy and presents today with new complaints of dizziness.  Her lightheadedness is suggestive of orthostasis.  Her exam remains normal and non-focal. MRI brain from July 2016 and September 2015 was reviewed and there has been  no progression of white matter changes, so my suspicion for multiple sclerosis is low.  She, however, remains highly concerned about the possibility so I offered CSF testing, but she declined.  US carotids will be ordered to be sure there vessels are patent with her new symptoms of dizziness. If symptoms persist, consider holter monitor.  Regarding her neuropathy, she has underwent extensive testing with multiple neurologists (Dr. Maureen Chatters at [facility], Dr. Berdine Addison at [place], and [place] [facility]) which have suggested she has idiopathic peripheral neuropathy based on clinical symptoms.   Previous evaluation has included several EMGs, some showing evidence of neuropathy and others which are normal. She is also underwent extensive laboratory testing including CRP, ANA, and color, C3, C4, VDRL, cryoglobulin, TPO antibody, thyroglobulin antibody, CMV, EBV, HIV, hepatitis panel, UPEP, and several paraneoplastic antibody panels which have been nondiagnostic.  Imaging of the brain was performed in 2015  and did not show any evidence of demyelinating disease.  Repeat electrodiagnostic testing peformed (2015) by myself does not show any evidence of large fiber neuropathy.  She has right ulnar neuropathy which is asymptomatic.  Small fiber neuropathy is most likely.  Other possibilities to with her skin discoloration and cold temperature of her toes, include either Raynaud's phenomena or reflex sympathetic dystrophy.     PLAN/RECOMMENDATIONS:  1.  US carotids 2.  Check B12 3.  Return to clinic in 6 months    The duration of this appointment visit was 25 minutes of face-to-face time with the patient.  Greater than 50% of this time was spent in counseling, explanation of diagnosis, planning of further management, and coordination of care.   Thank you for allowing me to participate in patient's care.  If I can answer any additional questions, I would be pleased to do so.    Sincerely,    Donika K. Posey Pronto, DO

## 2015-04-05 NOTE — Telephone Encounter (Signed)
Done

## 2015-04-05 NOTE — Telephone Encounter (Signed)
Megan [name] from downstairs needs labs put back in for this pt, stated that they got taken out by mistake

## 2015-04-06 ENCOUNTER — Other Ambulatory Visit: Payer: BLUE CROSS/BLUE SHIELD

## 2015-04-06 ENCOUNTER — Other Ambulatory Visit (INDEPENDENT_AMBULATORY_CARE_PROVIDER_SITE_OTHER): Payer: BLUE CROSS/BLUE SHIELD

## 2015-04-06 DIAGNOSIS — R209 Unspecified disturbances of skin sensation: Secondary | ICD-10-CM | POA: Diagnosis not present

## 2015-04-06 DIAGNOSIS — H539 Unspecified visual disturbance: Secondary | ICD-10-CM | POA: Diagnosis not present

## 2015-04-06 DIAGNOSIS — G5621 Lesion of ulnar nerve, right upper limb: Secondary | ICD-10-CM | POA: Diagnosis not present

## 2015-04-06 DIAGNOSIS — R42 Dizziness and giddiness: Secondary | ICD-10-CM

## 2015-04-06 LAB — VITAMIN B12: Vitamin B-12: 425 pg/mL (ref 211–911)

## 2015-04-09 ENCOUNTER — Other Ambulatory Visit: Payer: BLUE CROSS/BLUE SHIELD

## 2015-04-12 ENCOUNTER — Encounter (HOSPITAL_COMMUNITY): Payer: BLUE CROSS/BLUE SHIELD

## 2015-04-23 ENCOUNTER — Ambulatory Visit (HOSPITAL_COMMUNITY)
Admission: RE | Admit: 2015-04-23 | Discharge: 2015-04-23 | Disposition: A | Discharge status: Home / Self Care | Payer: BLUE CROSS/BLUE SHIELD | Source: Ambulatory Visit | Attending: Cardiovascular Disease | Admitting: Cardiovascular Disease

## 2015-04-23 DIAGNOSIS — R42 Dizziness and giddiness: Secondary | ICD-10-CM | POA: Diagnosis not present

## 2015-06-14 ENCOUNTER — Other Ambulatory Visit: Payer: Self-pay | Admitting: Gastroenterology

## 2015-06-14 DIAGNOSIS — R1031 Right lower quadrant pain: Secondary | ICD-10-CM

## 2015-06-14 DIAGNOSIS — R1011 Right upper quadrant pain: Secondary | ICD-10-CM

## 2015-06-19 ENCOUNTER — Other Ambulatory Visit: Payer: BLUE CROSS/BLUE SHIELD

## 2015-06-20 ENCOUNTER — Ambulatory Visit
Admission: RE | Admit: 2015-06-20 | Discharge: 2015-06-20 | Disposition: A | Discharge status: Home / Self Care | Payer: BLUE CROSS/BLUE SHIELD | Source: Ambulatory Visit | Attending: Gastroenterology | Admitting: Gastroenterology

## 2015-06-20 DIAGNOSIS — R1011 Right upper quadrant pain: Secondary | ICD-10-CM

## 2015-06-20 DIAGNOSIS — R1031 Right lower quadrant pain: Secondary | ICD-10-CM

## 2015-10-10 ENCOUNTER — Ambulatory Visit (INDEPENDENT_AMBULATORY_CARE_PROVIDER_SITE_OTHER): Payer: BLUE CROSS/BLUE SHIELD | Admitting: Neurology

## 2015-10-10 ENCOUNTER — Encounter: Payer: Self-pay | Admitting: Neurology

## 2015-10-10 ENCOUNTER — Telehealth: Payer: Self-pay | Admitting: Neurology

## 2015-10-10 VITALS — BP 100/68 | HR 67 | Ht 69.0 in | Wt 132.2 lb

## 2015-10-10 DIAGNOSIS — R209 Unspecified disturbances of skin sensation: Secondary | ICD-10-CM | POA: Diagnosis not present

## 2015-10-10 DIAGNOSIS — R5382 Chronic fatigue, unspecified: Secondary | ICD-10-CM

## 2015-10-10 DIAGNOSIS — H539 Unspecified visual disturbance: Secondary | ICD-10-CM

## 2015-10-10 DIAGNOSIS — R2689 Other abnormalities of gait and mobility: Secondary | ICD-10-CM | POA: Diagnosis not present

## 2015-10-10 NOTE — Progress Notes (Signed)
Follow-up Visit   Date: 10/10/2015    Megan [name] MRN: [id number] DOB: [birth year]   Interim History: Megan [name] is a 60 y.o. right-handed Caucasian female with history of osteoporosis, leukopenia, asthma, peripheral neuropathy of unknown etiology returning to the clinic for new complaints of eye discomfort and generalized fatigue.    History of present illness: She moved to [place] to help her parents in 2009.  The home that she moved into had a lot of mold and In November 2009, she developed severe pneumonia and collapsed lung and was hospitalized at [facility]. She was not intubated during the stay. She was told she had adult onset asthma and contacted a holistic doctor who was able to wean her off inhalers. She was doing well until 2012, when she developed bilateral feet swelling and burning pain. She went to see Sports Medicine, Podiatry, and PT for possible plantar fascitiis. It improved, but later that year, she developed cramping of the feet and discolaration. She even went to the Emergency Department on one occasion due due severe cyanosis of the feet and cold sensation. She was told to follow-up with her PCP who ordered lab testing and referred her to see [facility] for NCS/EMG which showed mild axonal peripheral neuropathy. She was being followed by Dr. Maureen Chatters who ordered additional labs and imaging of the brain, cervical and lumbar spine. Due to white matter changes seen on the brain and possibility of underlying vasculitis, she was given a two week trial of prednisone which did not change her symptoms. Labs were unrevealing (see below). Ultrasound of the lower extremity did not reveal any thrombus.   Her numbness and loss of balance has worsened over the years, but burning pain involving the soles of the feet has remained relatively unchanged. She also reports weakness of the legs. She was previously very active and went to the gym 7-days per week, but  now goes 4-5 days per week with lighter routines because she is unable to lift as much and has problems with her balance.   She sought a second opinion at [facility] and saw Dr. Elias Else in 2013. She underwent a second NCS/EMG which is interpreted as normal, although she was told she has neuropathy. Due to financial reasons, she did not followup at [facility] again. Autoimmune, inflammatory labs, as well as paraneoplastic panel was negative.   In October 2014, she started seeing Dr. Berdine Addison, neurologist, at [facility] who repeated NCS/EMG which was interpreted as sensorimotor peripheral neuropathy with axonal and demyelinating features.   From a symptomatic standpoint, she has been offered neuralgesic medications such as gabapentin, but she prefers to be on as little medication as possible so is not taking anything. Denies dry eyes, dry mouth, anhydrosis, or early satiety. She endorses light headedness. She was followed a vegan diet from 2013-2014 and recently added fish, chicken, and eggs to diet. She does not eat very much dairy and avoids gluten-products.   UPDATE 03/23/2014:  Her labs showed positive for monoclonal lambda light chains.  Subsequent work-up by Dr. Beryle Beams in hematology/oncology did not show monoclonal gammopathy.  Her EMG did not show any evidence of peripheral neuropathy, there was a moderate right ulnar neuropathy, but denies any paresthesias of the right hand.  She continues to have burning pain, swelling, and discoloration of the skin.  She complains of dull retroorbital headache, associated with photophobia. No nausea and vomiting.  Duration is typically all day, occuring  about once per week.  She does not like to take medications, so is not anything for pain.  She feels that her eyes are swollen and has blurry vision.  She had her eyes checked which was normal.   She continues seek a lot of alternative therapies with yoga, accupunture,  integrative medication, and "energy" exercises.    UPDATE 04/05/2015:  Over the past year, much of her unsteadiness and pain has improved.  She attributes this to working with her holistic doctor and changing her diet.  MRI brain was stable.  Her eyes feel "greasy" and had her eyes checked.  She was given prescription for corrective lenses, but is not using them.   UPDATE 10/10/2015:  She returns for 56-month follow-up appointment and states that she was doing well until fall of 2016 when she began having swelling and discoloration of the legs.  She is seeing a physical therapy for her calf pain and was told there is mild asymmetric in leg size.  She continues to be concerned about multiple sclerosis.  She is still having "greasy" sensation of her eyes and has been seeing an allergist and opthalmology.  She was given antibiotics for possible ocular rosea but did not take them. There is no vision loss or eye pain with movement, but if she touches her eyeball, there is soreness.      She was seeing a naturalist in [place] who recommended that she slowly wean herself off her effexor.  She is taking effexor 37.5mg  and even though this is the smallest dose, she is so worried about withdrawal that she is actually opening the capsule and self tapering by counting the individual granules!   Medications:  Current Outpatient Prescriptions on File Prior to Visit  Medication Sig Dispense Refill  . cholecalciferol (VITAMIN D) 1000 UNITS tablet Take 2,000 Units by mouth 2 (two) times a week.     . CYANOCOBALAMIN PO Take 1 tablet by mouth once a week.     . Digestive Enzymes (DIGESTIVE ENZYME PO) Take 1 tablet by mouth 3 (three) times daily.    [name] levothyroxine (SYNTHROID, LEVOTHROID) 25 MCG tablet Take 25 mcg by mouth daily before breakfast.     . magnesium oxide (MAG-OX) 400 MG tablet Take 400 mg by mouth 2 (two) times daily.    . Misc Natural Products (CHLORELLA) 500 MG CAPS Take 500 mg by mouth daily.    .  Multiple Vitamin (MULTIVITAMIN WITH MINERALS) TABS Take 1 tablet by mouth daily.    [name] NATURE-THROID 32.5 MG tablet Take 32.5 mg by mouth daily.  3  . venlafaxine (EFFEXOR) 37.5 MG tablet Take 37.5 mg by mouth at bedtime.     No current facility-administered medications on file prior to visit.    Allergies: No Known Allergies   Review of Systems:  CONSTITUTIONAL: No fevers, chills, night sweats, or weight loss.   EYES: No visual changes or eye pain ENT: No hearing changes.  No history of nose bleeds.   RESPIRATORY: No cough, wheezing and shortness of breath.   CARDIOVASCULAR: Negative for chest pain, and palpitations.   GI: Negative for abdominal discomfort, blood in stools or black stools.  No recent change in bowel habits.   GU:  No history of incontinence.   MUSCLOSKELETAL: + history of joint pain or swelling.  No myalgias.   SKIN: Negative for lesions, rash, and itching.   ENDOCRINE: Negative for cold or heat intolerance, polydipsia or goiter.   PSYCH:  +depression or  anxiety symptoms.   NEURO: As Above.   Vital Signs:  BP 100/68 mmHg  Pulse 67  Ht 5\' 9"  (1.753 m)  Wt 132 lb 3 oz (59.96 kg)  BMI 19.51 kg/m2  SpO2 98%  Neurological Exam: MENTAL STATUS including orientation to time, place, person, recent and remote memory, attention span and concentration, language, and fund of knowledge is normal.  Speech is not dysarthric.  CRANIAL NERVES: Normal fundoscopic exam.  Normal visual field defects. Pupils equal round and reactive to light.  Normal conjugate, extra-ocular eye movements in all directions of gaze.  No ptosis. Normal facial sensation.  Face is symmetric. Palate elevates symmetrically.  Tongue is midline.  MOTOR:  Motor strength is 5/5 in all extremities.  No atrophy, fasciculations or abnormal movements.  No pronator drift.  Tone is normal.   MSRs:  Reflexes are 2+/4 throughout.  Plantars are down going.   SENSORY:  Intact to vibration, temperature, and light  touch throughout.  COORDINATION/GAIT:  Gait narrow based and stable.  Tandem gait intact.   Data: MRI brain 05/16/2014: 1. No evidence of acute intracranial abnormality or mass.  2. Mild chronic small vessel ischemic disease.  3. Incidental pineal cyst, unchanged.  MRI brain 03/25/2015: 1. Stable scattered T2 hyperintensities, slightly advanced for age.  The finding is nonspecific but can be seen in the setting of chronic microvascular ischemia, a demyelinating process such as multiple sclerosis, vasculitis, complicated migraine headaches, or as the sequelae of a prior infectious or inflammatory process. 2. Stable benign appearing pineal cyst. 3. No acute intracranial abnormality or significant interval change.  Labs 12/06/2013:  2-hr glucose tolerance test, vitamin B12 932, vitamin B1 32, vitamin E 14.6, vitamin B6 29.9, zinc 87, copper 124, MMA 64*, folate >20, CRP <0.5, SPEP - no M protein, UPEP + monoclonal free Lambda light chains, ENA negative, ANA negative  Labs 10/25/2013: Sodium 143, potassium 4.2, calcium 9.5, AST 23, ALT 8, free T4 0.98, antithyroglobulin antibody negative, CRP 0.24, TPO antibody negative  Labs 09/14/2012: Lead 1.7   Labs 2013: cryoglobulin neg, ANA negative Copper 116, ANCA negative especially, C3 101, C4 25, VDRL negative, paraneoplastic autoantibody negative, UPEP no M protein, CMV, EBV, HIV, hep acute panel   MRI brain 01/29/2012: Mild nonspecific white matter hyperintensities in the subcortical regions. 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   MRI cervical spine with and without contrast 01/29/2012: Prominent spondylitic changes at C5-6 and C6-7 without significant compression.   MRI of the lumbar spine without contrast 03/18/2012: Mild disc degenerative changes from L3-S1 with severe left-sided foraminal narrowing at L3-4 and L4-5 without definite root impingement   EMG performed at [facility] 10/15/2011: Mild distal sensory axonal neuropathy.  EMG performed at [facility]: Normal study. No evidence of large fiber sensory motor neuropathy.  EMG performed at [facility] bilateral lower extremity 06/02/2013: Significant sensorimotor axonal and demyelinating peripheral neuropathy. Upon my review, sensory and motor amplitudes are preserved the latency is normal. Late responses are prolonged (F-wave) and conduction velocity is borderline slow 38-42 m/s. Temperature limb was not recorded.  EMG performed at [facility] 03/14/2014:  1. Right ulnar neuropathy with slowing across the elbow, demyelinating and axon loss in type. Overall, these findings are moderate in degree electrically. 2. There is no evidence of a generalized sensorimotor polyradiculoneuropathy, cervical radiculopathy, or lumbosacral radiculopathy affecting the right side.  US carotids 04/24/2015:  Normal  Lab  Results  Component Value Date   [id number] 04/06/2015     IMPRESSION/PLAN: Megan [name] is a 60 year old female who was seen for peripheral neuropathy and dizziness now presenting with complaints of abnormal sensation of the eyes and generalized fatigue.  Exam has always been normal.  Despite reviewing work-up previously, she is still very concerned that she may have multiple sclerosis.  Her MRIs show mild scattered white matter changes, which has been stable.  She has been offered CSF testing, but always declined.    At this juncture, I do not have much to offer patient.  I will reorder MRI brain to look for interval changes, but she may benefit from an opinion for MS specialist.  I offered to refer her to Dr. Felecia Shelling, our local specialist at [facility], but she prefers to be seen at an academic center.  Regarding her neuropathy, she has underwent extensive testing with multiple neurologists (Dr. Maureen Chatters at [facility], Dr. Berdine Addison at [place],  and [place] center) which have suggested she has idiopathic peripheral neuropathy based on clinical symptoms. Previous evaluation has included several EMGs, some showing evidence of neuropathy and others which are normal. She is also underwent extensive laboratory testing including CRP, ANA, and color, C3, C4, VDRL, cryoglobulin, TPO antibody, thyroglobulin antibody, CMV, EBV, HIV, hepatitis panel, UPEP, and several paraneoplastic antibody panels which have been nondiagnostic.  Repeat electrodiagnostic testing peformed (2015) by myself does not show any evidence of large fiber neuropathy.  She has right ulnar neuropathy which is asymptomatic.  Small fiber neuropathy is most likely.  Other possibilities to with her skin discoloration and cold temperature of her toes, include either Raynaud's phenomena or reflex sympathetic dystrophy.    PLAN/RECOMMENDATIONS:  1.  MRI brian wwo contrast 2.  Referral to [place]  The duration of this appointment visit was 25 minutes of face-to-face time with the patient.  Greater than 50% of this time was spent in counseling, explanation of diagnosis, planning of further management, and coordination of care.   Thank you for allowing me to participate in patient's care.  If I can answer any additional questions, I would be pleased to do so.    Sincerely,    Donika K. Posey Pronto, DO

## 2015-10-10 NOTE — Telephone Encounter (Signed)
Error

## 2015-10-10 NOTE — Progress Notes (Signed)
Referral made and note sent.   First available appointment is May 9.  They will mail new patient paperwork to patient.

## 2015-10-10 NOTE — Patient Instructions (Signed)
1. MRI brain wwo contrast 2. Continue to see your eye care specialist  3. We will send a referral to [facility]

## 2015-10-17 ENCOUNTER — Other Ambulatory Visit: Payer: BLUE CROSS/BLUE SHIELD

## 2015-10-22 ENCOUNTER — Inpatient Hospital Stay: Admission: RE | Admit: 2015-10-22 | Payer: BLUE CROSS/BLUE SHIELD | Source: Ambulatory Visit

## 2015-11-15 ENCOUNTER — Other Ambulatory Visit: Payer: BLUE CROSS/BLUE SHIELD

## 2015-11-21 ENCOUNTER — Other Ambulatory Visit (HOSPITAL_COMMUNITY): Payer: Self-pay | Admitting: Internal Medicine

## 2015-11-21 ENCOUNTER — Other Ambulatory Visit: Payer: Self-pay | Admitting: Internal Medicine

## 2015-11-21 ENCOUNTER — Ambulatory Visit
Admission: RE | Admit: 2015-11-21 | Discharge: 2015-11-21 | Disposition: A | Discharge status: Home / Self Care | Payer: BLUE CROSS/BLUE SHIELD | Source: Ambulatory Visit | Attending: Internal Medicine | Admitting: Internal Medicine

## 2015-11-21 DIAGNOSIS — R0781 Pleurodynia: Secondary | ICD-10-CM

## 2015-11-21 DIAGNOSIS — R1011 Right upper quadrant pain: Secondary | ICD-10-CM

## 2015-11-22 ENCOUNTER — Ambulatory Visit (HOSPITAL_COMMUNITY)
Admission: RE | Admit: 2015-11-22 | Discharge: 2015-11-22 | Disposition: A | Discharge status: Home / Self Care | Payer: BLUE CROSS/BLUE SHIELD | Source: Ambulatory Visit | Attending: Internal Medicine | Admitting: Internal Medicine

## 2015-11-22 DIAGNOSIS — R1011 Right upper quadrant pain: Secondary | ICD-10-CM

## 2015-12-25 DIAGNOSIS — F4322 Adjustment disorder with anxiety: Secondary | ICD-10-CM | POA: Diagnosis not present

## 2016-01-01 DIAGNOSIS — J310 Chronic rhinitis: Secondary | ICD-10-CM | POA: Diagnosis not present

## 2016-01-01 DIAGNOSIS — R51 Headache: Secondary | ICD-10-CM | POA: Diagnosis not present

## 2016-01-01 DIAGNOSIS — J343 Hypertrophy of nasal turbinates: Secondary | ICD-10-CM | POA: Diagnosis not present

## 2016-01-01 DIAGNOSIS — R42 Dizziness and giddiness: Secondary | ICD-10-CM | POA: Diagnosis not present

## 2016-01-02 ENCOUNTER — Other Ambulatory Visit (INDEPENDENT_AMBULATORY_CARE_PROVIDER_SITE_OTHER): Payer: Self-pay | Admitting: Otolaryngology

## 2016-01-02 DIAGNOSIS — J329 Chronic sinusitis, unspecified: Secondary | ICD-10-CM

## 2016-01-03 DIAGNOSIS — F4322 Adjustment disorder with anxiety: Secondary | ICD-10-CM | POA: Diagnosis not present

## 2016-01-07 DIAGNOSIS — E039 Hypothyroidism, unspecified: Secondary | ICD-10-CM | POA: Diagnosis not present

## 2016-01-09 ENCOUNTER — Ambulatory Visit
Admission: RE | Admit: 2016-01-09 | Discharge: 2016-01-09 | Disposition: A | Discharge status: Home / Self Care | Payer: BLUE CROSS/BLUE SHIELD | Source: Ambulatory Visit | Attending: Otolaryngology | Admitting: Otolaryngology

## 2016-01-09 DIAGNOSIS — J329 Chronic sinusitis, unspecified: Secondary | ICD-10-CM

## 2016-01-15 ENCOUNTER — Other Ambulatory Visit: Payer: Self-pay | Admitting: Internal Medicine

## 2016-01-15 ENCOUNTER — Ambulatory Visit
Admission: RE | Admit: 2016-01-15 | Discharge: 2016-01-15 | Disposition: A | Discharge status: Home / Self Care | Payer: BLUE CROSS/BLUE SHIELD | Source: Ambulatory Visit | Attending: Internal Medicine | Admitting: Internal Medicine

## 2016-01-15 DIAGNOSIS — D72810 Lymphocytopenia: Secondary | ICD-10-CM | POA: Diagnosis not present

## 2016-01-15 DIAGNOSIS — M79605 Pain in left leg: Secondary | ICD-10-CM | POA: Diagnosis not present

## 2016-01-15 DIAGNOSIS — M25572 Pain in left ankle and joints of left foot: Secondary | ICD-10-CM | POA: Diagnosis not present

## 2016-01-15 DIAGNOSIS — S99912A Unspecified injury of left ankle, initial encounter: Secondary | ICD-10-CM

## 2016-01-15 DIAGNOSIS — M7989 Other specified soft tissue disorders: Secondary | ICD-10-CM | POA: Diagnosis not present

## 2016-01-15 DIAGNOSIS — R5383 Other fatigue: Secondary | ICD-10-CM | POA: Diagnosis not present

## 2016-01-16 DIAGNOSIS — F4322 Adjustment disorder with anxiety: Secondary | ICD-10-CM | POA: Diagnosis not present

## 2016-01-23 DIAGNOSIS — H40013 Open angle with borderline findings, low risk, bilateral: Secondary | ICD-10-CM | POA: Diagnosis not present

## 2016-01-23 DIAGNOSIS — H01021 Squamous blepharitis right upper eyelid: Secondary | ICD-10-CM | POA: Diagnosis not present

## 2016-01-23 DIAGNOSIS — H04123 Dry eye syndrome of bilateral lacrimal glands: Secondary | ICD-10-CM | POA: Diagnosis not present

## 2016-01-23 DIAGNOSIS — H5712 Ocular pain, left eye: Secondary | ICD-10-CM | POA: Diagnosis not present

## 2016-01-28 ENCOUNTER — Other Ambulatory Visit: Payer: Self-pay | Admitting: Family Medicine

## 2016-01-28 DIAGNOSIS — Z1211 Encounter for screening for malignant neoplasm of colon: Secondary | ICD-10-CM

## 2016-01-30 DIAGNOSIS — F4322 Adjustment disorder with anxiety: Secondary | ICD-10-CM | POA: Diagnosis not present

## 2016-02-04 ENCOUNTER — Other Ambulatory Visit: Payer: BLUE CROSS/BLUE SHIELD

## 2016-02-04 DIAGNOSIS — L237 Allergic contact dermatitis due to plants, except food: Secondary | ICD-10-CM | POA: Diagnosis not present

## 2016-02-08 ENCOUNTER — Ambulatory Visit: Payer: BLUE CROSS/BLUE SHIELD

## 2016-02-12 ENCOUNTER — Other Ambulatory Visit (HOSPITAL_COMMUNITY)
Admission: RE | Admit: 2016-02-12 | Discharge: 2016-02-12 | Disposition: A | Discharge status: Home / Self Care | Payer: BLUE CROSS/BLUE SHIELD | Source: Ambulatory Visit | Attending: Oncology | Admitting: Oncology

## 2016-02-12 ENCOUNTER — Ambulatory Visit (INDEPENDENT_AMBULATORY_CARE_PROVIDER_SITE_OTHER): Payer: BLUE CROSS/BLUE SHIELD | Admitting: Oncology

## 2016-02-12 ENCOUNTER — Encounter: Payer: Self-pay | Admitting: Oncology

## 2016-02-12 VITALS — BP 89/63 | HR 62 | Temp 97.7°F | Ht 69.5 in | Wt 131.0 lb

## 2016-02-12 DIAGNOSIS — D72819 Decreased white blood cell count, unspecified: Secondary | ICD-10-CM

## 2016-02-12 DIAGNOSIS — F4322 Adjustment disorder with anxiety: Secondary | ICD-10-CM | POA: Diagnosis not present

## 2016-02-12 LAB — CBC WITH DIFFERENTIAL/PLATELET
Basophils Absolute: 0 10*3/uL (ref 0.0–0.1)
Basophils Relative: 1 %
Eosinophils Absolute: 0 10*3/uL (ref 0.0–0.7)
Eosinophils Relative: 1 %
HCT: 43 % (ref 36.0–46.0)
Hemoglobin: 14.2 g/dL (ref 12.0–15.0)
Lymphocytes Relative: 34 %
Lymphs Abs: 1.1 10*3/uL (ref 0.7–4.0)
MCH: 29.7 pg (ref 26.0–34.0)
MCHC: 33 g/dL (ref 30.0–36.0)
MCV: 90 fL (ref 78.0–100.0)
Monocytes Absolute: 0.3 10*3/uL (ref 0.1–1.0)
Monocytes Relative: 10 %
Neutro Abs: 1.7 10*3/uL (ref 1.7–7.7)
Neutrophils Relative %: 54 %
Platelets: 188 10*3/uL (ref 150–400)
RBC: 4.78 MIL/uL (ref 3.87–5.11)
RDW: 12.5 % (ref 11.5–15.5)
WBC: 3.2 10*3/uL — ABNORMAL LOW (ref 4.0–10.5)

## 2016-02-12 LAB — SAVE SMEAR

## 2016-02-12 NOTE — Patient Instructions (Signed)
To lab today I will call you when results available to set up appointment for discussion

## 2016-02-13 NOTE — Progress Notes (Signed)
Patient ID: Megan [name], female   DOB: 01-19-1956, 60 y.o.   MRN: [id number] Hematology and Oncology Follow Up Visit  Megan [name] [id number] March 16, 1956 60 y.o. 02/13/2016 10:50 AM   Principle Diagnosis: Encounter Diagnosis  Name Primary?  . Leukopenia Yes  Clinical summary: 60 year old woman initially referred to me in October 2013 to evaluate chronic neutropenia and idiopathic distal neuropathy affecting her feet. An extensive evaluation was done including myeloma screen, screen for collagen vascular disorders, screen for bone marrow suppressive viral agents(CMV, EBV, HIV, hepatitis A, B, C) , toxic drug screen including lead levels, and was unrevealing. A single spot urine specimen was positive for the presence of monoclonal lambda immunoglobulin. However when I repeated a 24-hour urine study in June of 2015, there was no proteinuria and no monoclonal proteins. She tested negative 2 for the presence of cryoglobulins. Normal ceruloplasmin level. She had a decrease in total serum IgG level without any monoclonal proteins in the serum. Other than the chronic leukopenia with a normal white count differential, she has never developed anemia or thrombocytopenia. She has no adenopathy or organomegaly on exam. She has never developed any progressive constitutional symptoms. She has not had problems with frequent or severe infection. Neuropathy remains limited to her feet. She had partial relief of her symptoms on a Vegan diet. She  had a surgical procedure on her sinuses on June 12, 2014. Specimens were negative for bacteria, fungi, or AFB. She sought additional consultation with an alternative medicine provider. She had an exposure to black mold back in 2009 and dates most of her symptoms subsequent to that exposure. She tested positive for the presence of "mold" in her urine. She was started on amphotericin B. She only received 1 dose since it caused significant toxicity. She was put on  vitamin C, niacin, multiple vitamins, and acidophilus and a another herbal antifungal medication and does feel that her neuropathy has improved about 50%.  Interim History:  She has had persistent fatigue and neuropathy. Over the last few months she has developed aching in her left eye. No obvious disease on exam by an ophthalmologist. She is still seeing an alternative medicine divider in addition to her internist. She brought me copies of most recent laboratory studies. White count as low as 2200 on 11/14/2015 but always with a normal differential: 57 neutrophils, 31 lymphs, 9 monocytes. Repeat CBC on May 23 with total white count 2500, 52 neutrophils, 35 lymphocytes, 10 monocytes. ESR 6 mm. Rheumatoid factor negative. She was tested for Lyme disease again on 11/14/2015. IgM titers negative. IgG Western blot negative but positive IgG for 3 out of proximally 10 serotypes tested. Positive IgG against mycoplasma but negative IgM. Flow cytometry Studies done on peripheral blood in December 2015 report stated population of CD8 negative/CD57 positive lymphocytes only numbered 29/mcL with normal range 60-3 60. She was told that this meant that  her immune system was suppressed. She remains quite distraught that nobody has been able to come up with a specific diagnosis to explain her complaints.  Medications: reviewed  Allergies: No Known Allergies  Review of Systems: See interim history Remaining ROS negative:   Physical Exam: Blood pressure 89/63, pulse 62, temperature 97.7 F (36.5 C), temperature source Oral, height 5' 9.5" (1.765 m), weight 131 lb (59.421 kg), SpO2 100 %. Wt Readings from Last 3 Encounters:  02/12/16 131 lb (59.421 kg)  10/10/15 132 lb 3 oz (59.96 kg)  04/05/15 130 lb 9 oz (59.223 kg)  General appearance: thin, well nourished, Caucasian woman HENNT: Pharynx no erythema, exudate, mass, or ulcer. No thyromegaly or thyroid nodules Lymph nodes: No cervical, supraclavicular,  or axillary lymphadenopathy Breasts:  Lungs: Clear to auscultation, resonant to percussion throughout Heart: Regular rhythm, no murmur, no gallop, no rub, no click, no edema Abdomen: Soft, nontender, normal bowel sounds, no mass, no organomegaly Extremities: No edema, no calf tenderness Musculoskeletal: no joint deformities GU:  Vascular: Carotid pulses 2+, no bruits, distal pulses: Dorsalis pedis 1+ symmetric Neurologic: Alert, oriented, PERRLA, optic discs sharp and vessels normal, no hemorrhage or exudate, cranial nerves grossly normal, motor strength 5 over 5, reflexes 1+ symmetric, upper body coordination normal, gait normal,Vibration sensation normal to tuning fork exam over the fingertips, absent on the feet. She can feel vibration by bone conduction right lateral malleolus but not left. Skin: No rash or ecchymosis  Lab Results: CBC W/Diff    Component Value Date/Time   WBC 3.2* 02/12/2016 1230   WBC 3.0* 03/15/2013 1144   RBC 4.78 02/12/2016 1230   RBC 4.59 03/15/2013 1144   HGB 14.2 02/12/2016 1230   HGB 13.7 03/15/2013 1144   HCT 43.0 02/12/2016 1230   HCT 40.8 03/15/2013 1144   PLT 188 02/12/2016 1230   PLT 210 03/15/2013 1144   MCV 90.0 02/12/2016 1230   MCV 88.9 03/15/2013 1144   MCH 29.7 02/12/2016 1230   MCH 29.9 03/15/2013 1144   MCHC 33.0 02/12/2016 1230   MCHC 33.6 03/15/2013 1144   RDW 12.5 02/12/2016 1230   RDW 13.8 03/15/2013 1144   LYMPHSABS 1.1 02/12/2016 1230   LYMPHSABS 1.0 03/15/2013 1144   MONOABS 0.3 02/12/2016 1230   MONOABS 0.3 03/15/2013 1144   EOSABS 0.0 02/12/2016 1230   EOSABS 0.0 03/15/2013 1144   BASOSABS 0.0 02/12/2016 1230   BASOSABS 0.1 03/15/2013 1144     Chemistry      Component Value Date/Time   NA 138 03/25/2015 1208   NA 140 05/24/2012 1053   K 4.0 03/25/2015 1208   K 4.1 05/24/2012 1053   CL 104 03/25/2015 1208   CL 106 05/24/2012 1053   CO2 25 03/25/2015 1208   CO2 27 05/24/2012 1053   BUN 11 03/25/2015 1208   BUN  13.0 05/24/2012 1053   CREATININE 0.63 03/25/2015 1208   CREATININE 0.59 12/26/2013 1249   CREATININE 0.7 05/24/2012 1053      Component Value Date/Time   CALCIUM 9.0 03/25/2015 1208   CALCIUM 9.5 05/24/2012 1053   ALKPHOS 69 12/26/2013 1249   ALKPHOS 65 05/24/2012 1053   AST 22 12/26/2013 1249   AST 29 05/24/2012 1053   ALT 9 12/26/2013 1249   ALT 13 05/24/2012 1053   BILITOT 0.5 12/26/2013 1249   BILITOT 0.50 05/24/2012 1053       Radiological Studies: Dg Ankle Complete Left  02-08-16  CLINICAL DATA:  Left ankle pain after injury 2 weeks ago. Initial encounter. EXAM: LEFT ANKLE COMPLETE - 3+ VIEW COMPARISON:  None. FINDINGS: There is no evidence of fracture, dislocation, or joint effusion. There is no evidence of arthropathy or other focal bone abnormality. Soft tissue swelling is seen over the lateral malleolus. IMPRESSION: Soft tissue swelling seen over lateral malleolus suggesting ligamentous injury. No definite fracture or dislocation is noted. Electronically Signed   By: Marijo Conception, M.D.   On: 02/08/2016 13:25    Impression:  #1. Chronic, stable, with some fluctuations, leukopenia with normal white count differential. No lymphadenopathy or organomegaly. No  recurrent infections.  I really do not know how to interpret the T lymphocyte studies done through her alternative practitioner's office. They were sent to a reliable laboratory ([facility]) but they are really not specific for any immune or lymphoproliferative disorder. CD8 is a marker for T suppressor cells. CD157 is a marker of senescnece for T cells. It is usually expressed on natural killer subset of T cells. It is unclear to me how this relates to immunosuppression, leukopenia, chronic fatigue, or neuropathy. I spent about 45 minutes with the patient. She remains very frustrated and wants a diagnosis. I told her that what  we are seeing may just be a footprint of a previous virus infection but there is no way to prove  this. It is basically guilt by association. Trials of antiviral agents have been used in chronic fatigue syndrome since a number of viruses have been implicated particularly EBV and herpes virus 8. These have not improved the clinical condition of the patients treated and have been abandoned. Once again we discussed doing a bone marrow biopsy. I do feel that this will be low yield but if she wants to leave no stone unturned, it might give [place] some information or at least exclude certain conditions. A more reliable flow cytometry analysis could be done. I told her we could also just repeat flow cytometry on the peripheral blood first to see what this shows with a more expanded panel of B and T cell markers. She would prefer to do this first. We discussed a second opinion at a university hospital. I suggested the [facility] in [place]. There is a good chance that if I do a bone marrow biopsy here that wherever I send her they will just repeat it anyway. I can't see putting her through 2 procedures if the pretest probability that we will find anything significant that will change her clinical management is low in the first place.    CC: Patient Care Team: Lavone Orn, MD as PCP - General Annia Belt, MD as Consulting Physician (Oncology) Alda Berthold, DO as Consulting Physician (Neurology)   Annia Belt, MD 6/21/201710:50 AM

## 2016-02-22 DIAGNOSIS — Z85828 Personal history of other malignant neoplasm of skin: Secondary | ICD-10-CM | POA: Diagnosis not present

## 2016-02-22 DIAGNOSIS — L814 Other melanin hyperpigmentation: Secondary | ICD-10-CM | POA: Diagnosis not present

## 2016-02-25 DIAGNOSIS — M25551 Pain in right hip: Secondary | ICD-10-CM | POA: Diagnosis not present

## 2016-02-25 DIAGNOSIS — M5137 Other intervertebral disc degeneration, lumbosacral region: Secondary | ICD-10-CM | POA: Diagnosis not present

## 2016-02-25 DIAGNOSIS — M9903 Segmental and somatic dysfunction of lumbar region: Secondary | ICD-10-CM | POA: Diagnosis not present

## 2016-02-25 DIAGNOSIS — M9905 Segmental and somatic dysfunction of pelvic region: Secondary | ICD-10-CM | POA: Diagnosis not present

## 2016-02-28 DIAGNOSIS — M25551 Pain in right hip: Secondary | ICD-10-CM | POA: Diagnosis not present

## 2016-02-28 DIAGNOSIS — M9903 Segmental and somatic dysfunction of lumbar region: Secondary | ICD-10-CM | POA: Diagnosis not present

## 2016-02-28 DIAGNOSIS — M9905 Segmental and somatic dysfunction of pelvic region: Secondary | ICD-10-CM | POA: Diagnosis not present

## 2016-02-28 DIAGNOSIS — M5137 Other intervertebral disc degeneration, lumbosacral region: Secondary | ICD-10-CM | POA: Diagnosis not present

## 2016-03-03 ENCOUNTER — Emergency Department (HOSPITAL_COMMUNITY)
Admission: EM | Admit: 2016-03-03 | Discharge: 2016-03-03 | Disposition: A | Discharge status: Home / Self Care | Payer: BLUE CROSS/BLUE SHIELD | Attending: Emergency Medicine | Admitting: Emergency Medicine

## 2016-03-03 ENCOUNTER — Ambulatory Visit (HOSPITAL_COMMUNITY)
Admission: EM | Admit: 2016-03-03 | Discharge: 2016-03-03 | Disposition: A | Discharge status: Home / Self Care | Payer: BLUE CROSS/BLUE SHIELD

## 2016-03-03 ENCOUNTER — Encounter (HOSPITAL_COMMUNITY): Payer: Self-pay | Admitting: Emergency Medicine

## 2016-03-03 DIAGNOSIS — M9905 Segmental and somatic dysfunction of pelvic region: Secondary | ICD-10-CM | POA: Diagnosis not present

## 2016-03-03 DIAGNOSIS — R110 Nausea: Secondary | ICD-10-CM | POA: Diagnosis not present

## 2016-03-03 DIAGNOSIS — R197 Diarrhea, unspecified: Secondary | ICD-10-CM | POA: Insufficient documentation

## 2016-03-03 DIAGNOSIS — R531 Weakness: Secondary | ICD-10-CM

## 2016-03-03 DIAGNOSIS — M542 Cervicalgia: Secondary | ICD-10-CM

## 2016-03-03 DIAGNOSIS — R42 Dizziness and giddiness: Secondary | ICD-10-CM | POA: Insufficient documentation

## 2016-03-03 DIAGNOSIS — R51 Headache: Secondary | ICD-10-CM | POA: Diagnosis not present

## 2016-03-03 DIAGNOSIS — M25551 Pain in right hip: Secondary | ICD-10-CM | POA: Diagnosis not present

## 2016-03-03 DIAGNOSIS — J45909 Unspecified asthma, uncomplicated: Secondary | ICD-10-CM | POA: Insufficient documentation

## 2016-03-03 DIAGNOSIS — M549 Dorsalgia, unspecified: Secondary | ICD-10-CM | POA: Diagnosis not present

## 2016-03-03 DIAGNOSIS — Z5321 Procedure and treatment not carried out due to patient leaving prior to being seen by health care provider: Secondary | ICD-10-CM | POA: Insufficient documentation

## 2016-03-03 DIAGNOSIS — R200 Anesthesia of skin: Secondary | ICD-10-CM | POA: Diagnosis not present

## 2016-03-03 DIAGNOSIS — M5137 Other intervertebral disc degeneration, lumbosacral region: Secondary | ICD-10-CM | POA: Diagnosis not present

## 2016-03-03 DIAGNOSIS — M9903 Segmental and somatic dysfunction of lumbar region: Secondary | ICD-10-CM | POA: Diagnosis not present

## 2016-03-03 LAB — BASIC METABOLIC PANEL
Anion gap: 7 (ref 5–15)
BUN: 8 mg/dL (ref 6–20)
CO2: 28 mmol/L (ref 22–32)
Calcium: 9.5 mg/dL (ref 8.9–10.3)
Chloride: 105 mmol/L (ref 101–111)
Creatinine, Ser: 0.68 mg/dL (ref 0.44–1.00)
GFR calc Af Amer: >60 mL/min (ref >60)
GFR calc non Af Amer: >60 mL/min (ref >60)
Glucose, Bld: 83 mg/dL (ref 65–99)
Potassium: 3.4 mmol/L — ABNORMAL LOW (ref 3.5–5.1)
Sodium: 140 mmol/L (ref 135–145)

## 2016-03-03 LAB — CBC
HCT: 42.2 % (ref 36.0–46.0)
Hemoglobin: 14 g/dL (ref 12.0–15.0)
MCH: 30 pg (ref 26.0–34.0)
MCHC: 33.2 g/dL (ref 30.0–36.0)
MCV: 90.6 fL (ref 78.0–100.0)
Platelets: 209 10*3/uL (ref 150–400)
RBC: 4.66 MIL/uL (ref 3.87–5.11)
RDW: 12.6 % (ref 11.5–15.5)
WBC: 3.4 10*3/uL — ABNORMAL LOW (ref 4.0–10.5)

## 2016-03-03 NOTE — ED Notes (Signed)
No RN available on First Nurse line...report given to [facility] EMT

## 2016-03-03 NOTE — ED Notes (Signed)
Patient stated she will go see her doctor

## 2016-03-03 NOTE — ED Provider Notes (Signed)
CSN: [id number]     Arrival date & time 03/03/16  1328 History   None    Chief Complaint  Patient presents with  . Nausea  . Dizziness   (Consider location/radiation/quality/duration/timing/severity/associated sxs/prior Treatment) Patient is a 60 y.o. female presenting with dizziness. The history is provided by the patient.  Dizziness Quality:  Lightheadedness, vertigo and imbalance Severity:  Moderate Onset quality:  Sudden Duration:  6 hours Timing:  Constant Progression:  Worsening Chronicity:  New Context: head movement and physical activity   Relieved by:  Nothing Worsened by:  Nothing Ineffective treatments:  None tried Associated symptoms: weakness     Past Medical History  Diagnosis Date  . MVP (mitral valve prolapse)   . Depression   . Orthostatic hypotension   . Peripheral neuropathy ([place]) 06/09/2012  . Hypothyroidism   . Pneumothorax   . Asthma   . Osteoporosis   . Plantar fasciitis    Past Surgical History  Procedure Laterality Date  . Tonsillectomy  1962  . Mouth surgery  01/2012    bone graft in mouth  . Colonoscopy with propofol N/A 02/22/2013    Procedure: COLONOSCOPY WITH PROPOFOL;  Surgeon: Garlan Fair, MD;  Location: [facility];  Service: Endoscopy;  Laterality: N/A;  . Root canal  02/27/2012  . Nasal sinus surgery Right 06/12/2014    Procedure: RIGHT ENDOSCOPIC MAXILLARY ANTROSTOMY;  Surgeon: Ascencion Dike, MD;  Location: [place];  Service: ENT;  Laterality: Right;   Family History  Problem Relation Age of Onset  . Hyperlipidemia Father   . Heart failure Father   . Prostate cancer Father   . Kidney failure Father   . Angina Mother   . Hypertension Mother   . Lung cancer Mother   . Lung cancer Paternal Grandfather   . Breast cancer Paternal Grandmother   . Diabetes Paternal Grandmother   . Hypertension Father   . Colon cancer Mother    Social History  Substance Use Topics  . Smoking status: Never Smoker   .  Smokeless tobacco: Never Used  . Alcohol Use: No   OB History    No data available     Review of Systems  HENT: Negative.   Eyes: Negative.   Respiratory: Negative.   Cardiovascular: Negative.   Endocrine: Negative.   Genitourinary: Negative.   Musculoskeletal: Negative.   Skin: Negative.   Allergic/Immunologic: Negative.   Neurological: Positive for dizziness, weakness, light-headedness and numbness.  Hematological: Negative.   Psychiatric/Behavioral: Negative.     Allergies  Review of patient's allergies indicates no known allergies.  Home Medications   Prior to Admission medications   Medication Sig Start Date End Date Taking? Authorizing Provider  levothyroxine (SYNTHROID, LEVOTHROID) 25 MCG tablet Take 25 mcg by mouth daily before breakfast.    Yes Historical Provider, MD  cholecalciferol (VITAMIN D) 1000 UNITS tablet Take 2,000 Units by mouth 2 (two) times a week.     Historical Provider, MD  CYANOCOBALAMIN PO Take 1 tablet by mouth once a week.     Historical Provider, MD  Digestive Enzymes (DIGESTIVE ENZYME PO) Take 1 tablet by mouth 3 (three) times daily.    Historical Provider, MD  magnesium oxide (MAG-OX) 400 MG tablet Take 400 mg by mouth 2 (two) times daily.    Historical Provider, MD  Misc Natural Products (CHLORELLA) 500 MG CAPS Take 500 mg by mouth daily.    Historical Provider, MD  Multiple Vitamin (MULTIVITAMIN WITH MINERALS) TABS Take  1 tablet by mouth daily.    Historical Provider, MD  NATURE-THROID 32.5 MG tablet Take 32.5 mg by mouth daily. 02/23/15   Historical Provider, MD  venlafaxine (EFFEXOR) 37.5 MG tablet Take 37.5 mg by mouth at bedtime.    Historical Provider, MD   Meds Ordered and Administered this Visit  Medications - No data to display  BP 131/60 mmHg  Pulse 63  Temp(Src) 97.8 F (36.6 C) (Oral)  Resp 16  SpO2 100% No data found.   Physical Exam  Constitutional: She is oriented to person, place, and time. She appears well-developed  and well-nourished.  HENT:  Head: Normocephalic.  Right Ear: External ear normal.  Left Ear: External ear normal.  Mouth/Throat: Oropharynx is clear and moist.  Eyes: Conjunctivae are normal. Pupils are equal, round, and reactive to light.  EOMI with nystagmus horizontal  Pulmonary/Chest: Effort normal and breath sounds normal.  Abdominal: Soft. Bowel sounds are normal.  Neurological: She is alert and oriented to person, place, and time.    ED Course  Procedures (including critical care time)  Labs Review Labs Reviewed - No data to display  Imaging Review No results found.   Visual Acuity Review  Right Eye Distance:   Left Eye Distance:   Bilateral Distance:    Right Eye Near:   Left Eye Near:    Bilateral Near:         MDM  Numbness Vertigo Weakness  Concerned for CVA so will transfer to ED.  She states her sx's started after 0800 this am right after being adjusted by chiropractor.  She has chronic LE neuropathy but weakness and dizziness started right after her adjustment.  She was adjusted in Lumbar spine not cervical spine according to patient. She states the dizziness numbness in arms and weakness started right after her adjustment around 0800 this am.    Lysbeth Penner, FNP 03/03/16 1439

## 2016-03-03 NOTE — ED Notes (Signed)
The patient presented to the [facility] with a complaint of Nausea and Dizziness that started about 20 minutes after receiving a spinal adjustment at a chiropractor's office.

## 2016-03-03 NOTE — ED Notes (Signed)
Pt. Stated, I went to the chiropractor and had a decompression, spinal adjustment , and laser treatment that was at 830 and around 10 I started having neck pain, back pain, Nausea, and diarrhea, which has made me dizzy and making me shakey.

## 2016-03-05 ENCOUNTER — Ambulatory Visit (INDEPENDENT_AMBULATORY_CARE_PROVIDER_SITE_OTHER): Payer: BLUE CROSS/BLUE SHIELD | Admitting: Infectious Diseases

## 2016-03-05 ENCOUNTER — Encounter: Payer: Self-pay | Admitting: Infectious Diseases

## 2016-03-05 VITALS — BP 93/61 | HR 69 | Temp 98.0°F | Ht 69.5 in | Wt 130.0 lb

## 2016-03-05 DIAGNOSIS — R5382 Chronic fatigue, unspecified: Secondary | ICD-10-CM | POA: Diagnosis not present

## 2016-03-05 DIAGNOSIS — S161XXA Strain of muscle, fascia and tendon at neck level, initial encounter: Secondary | ICD-10-CM | POA: Diagnosis not present

## 2016-03-05 DIAGNOSIS — R202 Paresthesia of skin: Secondary | ICD-10-CM | POA: Diagnosis not present

## 2016-03-05 LAB — RHEUMATOID FACTOR: Rhuematoid fact SerPl-aCnc: <10 IU/mL (ref <=14)

## 2016-03-05 LAB — CBC WITH DIFFERENTIAL/PLATELET
Basophils Absolute: 30 cells/uL (ref 0–200)
Basophils Relative: 1 %
Eosinophils Absolute: 60 cells/uL (ref 15–500)
Eosinophils Relative: 2 %
HCT: 41.6 % (ref 35.0–45.0)
Hemoglobin: 14 g/dL (ref 11.7–15.5)
Lymphocytes Relative: 27 %
Lymphs Abs: 810 cells/uL — ABNORMAL LOW (ref 850–3900)
MCH: 29.9 pg (ref 27.0–33.0)
MCHC: 33.7 g/dL (ref 32.0–36.0)
MCV: 88.9 fL (ref 80.0–100.0)
MPV: 9.9 fL (ref 7.5–12.5)
Monocytes Absolute: 270 cells/uL (ref 200–950)
Monocytes Relative: 9 %
Neutro Abs: 1830 cells/uL (ref 1500–7800)
Neutrophils Relative %: 61 %
Platelets: 229 10*3/uL (ref 140–400)
RBC: 4.68 MIL/uL (ref 3.80–5.10)
RDW: 13.5 % (ref 11.0–15.0)
WBC: 3 10*3/uL — ABNORMAL LOW (ref 3.8–10.8)

## 2016-03-05 LAB — COMPREHENSIVE METABOLIC PANEL
ALT: 10 U/L (ref 6–29)
AST: 18 U/L (ref 10–35)
Albumin: 4.7 g/dL (ref 3.6–5.1)
Alkaline Phosphatase: 77 U/L (ref 33–130)
BUN: 10 mg/dL (ref 7–25)
CO2: 26 mmol/L (ref 20–31)
Calcium: 9.3 mg/dL (ref 8.6–10.4)
Chloride: 104 mmol/L (ref 98–110)
Creat: 0.63 mg/dL (ref 0.50–0.99)
Glucose, Bld: 77 mg/dL (ref 65–99)
Potassium: 4.3 mmol/L (ref 3.5–5.3)
Sodium: 139 mmol/L (ref 135–146)
Total Bilirubin: 0.6 mg/dL (ref 0.2–1.2)
Total Protein: 6.5 g/dL (ref 6.1–8.1)

## 2016-03-05 LAB — C-REACTIVE PROTEIN: CRP: <0.5 mg/dL (ref <0.60)

## 2016-03-05 NOTE — Progress Notes (Signed)
   Subjective:    Patient ID: Megan [name], female    DOB: [birth year], 60 y.o.   MRN: [id number]  HPI 60 yo F with hx of fatigue and muscle soreness. She was seen by a "holostic" MD recenlty and had a positive mycoplasma IgM this spring as well as a positive Lyme (2/5) and she requested ID eval.  She has a hx of abn CBC (her WBC was 3.6 on 11-21-15). She had a CXR that was negative, u/s of RUQ negative.  She also been eval for dizziness and peripheral neuropathy by neuro. Her w/u to date has been unremarkable (dx idiopathic peripheral neuropathy). She had MRI that was not consistent with MS. She has f/u at [facility] next week.  (July 2016): 1. Stable scattered T2 hyperintensities, slightly advanced for age. The finding is nonspecific but can be seen in the setting of chronic microvascular ischemia, a demyelinating process such as multiple sclerosis, vasculitis, complicated migraine headaches, or as the sequelae of a prior infectious or inflammatory process. 2. Stable benign appearing pineal cyst. 3. No acute intracranial abnormality or significant interval change.  She has also been eval for "chronic idiopathic leukopenia" and has been seen by Heme ([place] WBC 3.8 on 01-2015) A bone marrow bx was deferred  previously.  She has recently been seen by a chiropracter and has significant muscle pain since.   She details that since moving here in 21-Dec-2007 to help take care of her partents (both since deceased) ([place] 21-[place]-2003) she has had LE swelling (L>R occas duskiness), mold exposure. She states she has lost most of her muscle strength as well.  She was previously very active and was able to run. She is doing spin 3x/wk, yoga, walking 3 miles several times a week.   The past medical history, family history and social history were reviewed/updated in EPIC   Review of Systems  Constitutional: Negative for appetite change and unexpected weight change.  Eyes: Negative for visual  disturbance.  Respiratory: Negative for cough and shortness of breath.   Gastrointestinal: Negative for diarrhea and constipation.  Genitourinary: Negative for difficulty urinating and menstrual problem.  Musculoskeletal: Positive for joint swelling.  Neurological: Positive for dizziness and numbness.  Hematological: Negative for adenopathy.   Last mammogram Dec 2016, Colonoscopy 02-2013 (done every 5 yrs due to mom having Colon CA), last PAP Dec 2016.    Objective:   Physical Exam  Constitutional: She appears well-developed and well-nourished.  HENT:  Mouth/Throat: No oropharyngeal exudate.  Eyes: EOM are normal. Pupils are equal, round, and reactive to light.  Neck: Neck supple.  Cardiovascular: Normal rate, regular rhythm and normal heart sounds.   Pulmonary/Chest: Effort normal and breath sounds normal.  Abdominal: Soft. Bowel sounds are normal. There is no tenderness. There is no rebound.  Musculoskeletal: She exhibits no edema.  Lymphadenopathy:    She has no cervical adenopathy.    She has no axillary adenopathy.       Right: No supraclavicular adenopathy present.       Left: No supraclavicular adenopathy present.  Skin: No rash noted.      Assessment & Plan:

## 2016-03-05 NOTE — Assessment & Plan Note (Signed)
Will check her for CMV, EBV, Toxo, ANA, RF, ESR/CRP.  I advised her against getting treatment for Lyme, mycoplasma, or mold.  I explained that ID society guidelines do not support this and there is significant risk of toxicty.  Will see her back in 2 weeks.  I suspect she has "chronic fatigue" and will need f/u with her PCP.

## 2016-03-05 NOTE — Addendum Note (Signed)
Addended by: Dolan Amen D on: 03/05/2016 11:33 AM   Modules accepted: Orders

## 2016-03-06 DIAGNOSIS — F4322 Adjustment disorder with anxiety: Secondary | ICD-10-CM | POA: Diagnosis not present

## 2016-03-06 LAB — SEDIMENTATION RATE: Sed Rate: 1 mm/hr (ref 0–30)

## 2016-03-06 LAB — ANA: Anti Nuclear Antibody(ANA): NEGATIVE

## 2016-03-06 LAB — EPSTEIN-BARR VIRUS VCA ANTIBODY PANEL
EBV NA IgG: <18 U/mL
EBV VCA IgG: <18 U/mL
EBV VCA IgM: <36 U/mL

## 2016-03-06 LAB — ANCA SCREEN W REFLEX TITER: ANCA Screen: NEGATIVE

## 2016-03-07 LAB — CYTOMEGALOVIRUS ANTIBODY, IGG: Cytomegalovirus Ab-IgG: <0.6 U/mL (ref <0.60)

## 2016-03-07 LAB — TOXOPLASMA GONDII ANTIBODY, IGG: Toxoplasma IgG Ratio: <7.2 IU/mL (ref <7.20)

## 2016-03-07 LAB — CMV IGM: CMV IgM: <30 AU/mL (ref <30.00)

## 2016-03-07 LAB — TOXOPLASMA GONDII ANTIBODY, IGM: Toxoplasma Antibody- IgM: <8 AU/mL (ref <8.00)

## 2016-03-08 ENCOUNTER — Telehealth (HOSPITAL_BASED_OUTPATIENT_CLINIC_OR_DEPARTMENT_OTHER): Payer: Self-pay | Admitting: *Deleted

## 2016-03-11 DIAGNOSIS — M791 Myalgia: Secondary | ICD-10-CM | POA: Diagnosis not present

## 2016-03-11 DIAGNOSIS — M9903 Segmental and somatic dysfunction of lumbar region: Secondary | ICD-10-CM | POA: Diagnosis not present

## 2016-03-11 DIAGNOSIS — M7071 Other bursitis of hip, right hip: Secondary | ICD-10-CM | POA: Diagnosis not present

## 2016-03-11 DIAGNOSIS — M9902 Segmental and somatic dysfunction of thoracic region: Secondary | ICD-10-CM | POA: Diagnosis not present

## 2016-03-11 DIAGNOSIS — G959 Disease of spinal cord, unspecified: Secondary | ICD-10-CM | POA: Diagnosis not present

## 2016-03-11 DIAGNOSIS — M9906 Segmental and somatic dysfunction of lower extremity: Secondary | ICD-10-CM | POA: Diagnosis not present

## 2016-03-11 DIAGNOSIS — M9905 Segmental and somatic dysfunction of pelvic region: Secondary | ICD-10-CM | POA: Diagnosis not present

## 2016-03-12 ENCOUNTER — Ambulatory Visit: Payer: Self-pay | Admitting: Podiatry

## 2016-03-13 DIAGNOSIS — F4322 Adjustment disorder with anxiety: Secondary | ICD-10-CM | POA: Diagnosis not present

## 2016-03-17 DIAGNOSIS — F4322 Adjustment disorder with anxiety: Secondary | ICD-10-CM | POA: Diagnosis not present

## 2016-03-18 DIAGNOSIS — M9903 Segmental and somatic dysfunction of lumbar region: Secondary | ICD-10-CM | POA: Diagnosis not present

## 2016-03-18 DIAGNOSIS — M7071 Other bursitis of hip, right hip: Secondary | ICD-10-CM | POA: Diagnosis not present

## 2016-03-18 DIAGNOSIS — M9905 Segmental and somatic dysfunction of pelvic region: Secondary | ICD-10-CM | POA: Diagnosis not present

## 2016-03-18 DIAGNOSIS — M791 Myalgia: Secondary | ICD-10-CM | POA: Diagnosis not present

## 2016-03-18 DIAGNOSIS — M9906 Segmental and somatic dysfunction of lower extremity: Secondary | ICD-10-CM | POA: Diagnosis not present

## 2016-03-18 DIAGNOSIS — M9902 Segmental and somatic dysfunction of thoracic region: Secondary | ICD-10-CM | POA: Diagnosis not present

## 2016-03-18 DIAGNOSIS — M7741 Metatarsalgia, right foot: Secondary | ICD-10-CM | POA: Diagnosis not present

## 2016-03-19 ENCOUNTER — Encounter: Payer: Self-pay | Admitting: Infectious Diseases

## 2016-03-19 ENCOUNTER — Ambulatory Visit (INDEPENDENT_AMBULATORY_CARE_PROVIDER_SITE_OTHER): Payer: BLUE CROSS/BLUE SHIELD | Admitting: Infectious Diseases

## 2016-03-19 DIAGNOSIS — R5382 Chronic fatigue, unspecified: Secondary | ICD-10-CM

## 2016-03-19 NOTE — Progress Notes (Signed)
   Subjective:    Patient ID: Megan [name], female    DOB: 1955/12/16, 60 y.o.   MRN: [id number]  HPI 60 yo F with hx of fatigue and muscle soreness. She was seen by a "holostic" MD recenlty and had a positive mycoplasma IgM this spring as well as a positive Lyme (2/5) and she requested ID eval.  She has a hx of abn CBC (her WBC was 3.6 on 11-21-15). She had a CXR that was negative, u/s of RUQ negative.  She also been eval for dizziness and peripheral neuropathy by neuro. Her w/u to date has been unremarkable (dx idiopathic peripheral neuropathy). She had MRI that was not consistent with MS.  (July 2016): 1. Stable scattered T2 hyperintensities, slightly advanced for age. The finding is nonspecific but can be seen in the setting of chronic microvascular ischemia, a demyelinating process such as multiple sclerosis, vasculitis, complicated migraine headaches, or as the sequelae of a prior infectious or inflammatory process. 2. Stable benign appearing pineal cyst. 3. No acute intracranial abnormality or significant interval change.  She has also been eval for "chronic idiopathic leukopenia" and has been seen by Heme ([place] WBC 3.8 on 01-2015) A bone marrow bx was deferred  previously.  She has recently been seen by a chiropracter and has significant muscle pain since.   She details that since moving here in 12-08-2007 to help take care of her partents (both since deceased) ([place] 12-08-03) she has had LE swelling (L>R occas duskiness), mold exposure. She states she has lost most of her muscle strength as well.   She was seen at [facility] by an [place] specialist- she is to have 2 MRI this week.  She denies any recent illnesses. Has been to yoga this AM.     Review of Systems See HPI    Objective:   Physical Exam  Constitutional: She appears well-developed and well-nourished. No distress.       Assessment & Plan:

## 2016-03-19 NOTE — Assessment & Plan Note (Signed)
Her EBV, CMV and toxo are negative. Her ANA was negative as well, her ESR was normal.  We spoke about possible infectious triggers for MS- she has had no recent illnesses so I am not clear that this is the case.  As well, she has not had a diarrheal illness which can trigger Guillain Barre (Campylobacter). Since she does not have diarreha at this time, I would not send Cx.  She will f/u with [facility].  Will see her back prn

## 2016-03-22 DIAGNOSIS — M9971 Connective tissue and disc stenosis of intervertebral foramina of cervical region: Secondary | ICD-10-CM | POA: Diagnosis not present

## 2016-03-22 DIAGNOSIS — G959 Disease of spinal cord, unspecified: Secondary | ICD-10-CM | POA: Diagnosis not present

## 2016-03-24 DIAGNOSIS — E039 Hypothyroidism, unspecified: Secondary | ICD-10-CM | POA: Diagnosis not present

## 2016-03-24 DIAGNOSIS — M792 Neuralgia and neuritis, unspecified: Secondary | ICD-10-CM | POA: Diagnosis not present

## 2016-04-02 DIAGNOSIS — F4322 Adjustment disorder with anxiety: Secondary | ICD-10-CM | POA: Diagnosis not present

## 2016-04-03 DIAGNOSIS — F4322 Adjustment disorder with anxiety: Secondary | ICD-10-CM | POA: Diagnosis not present

## 2016-04-16 DIAGNOSIS — D235 Other benign neoplasm of skin of trunk: Secondary | ICD-10-CM | POA: Diagnosis not present

## 2016-04-16 DIAGNOSIS — D3617 Benign neoplasm of peripheral nerves and autonomic nervous system of trunk, unspecified: Secondary | ICD-10-CM | POA: Diagnosis not present

## 2016-04-24 DIAGNOSIS — F4322 Adjustment disorder with anxiety: Secondary | ICD-10-CM | POA: Diagnosis not present

## 2016-04-25 DIAGNOSIS — F4322 Adjustment disorder with anxiety: Secondary | ICD-10-CM | POA: Diagnosis not present

## 2016-04-29 DIAGNOSIS — F4322 Adjustment disorder with anxiety: Secondary | ICD-10-CM | POA: Diagnosis not present

## 2016-04-30 DIAGNOSIS — R197 Diarrhea, unspecified: Secondary | ICD-10-CM | POA: Diagnosis not present

## 2016-05-05 DIAGNOSIS — E039 Hypothyroidism, unspecified: Secondary | ICD-10-CM | POA: Diagnosis not present

## 2016-05-05 DIAGNOSIS — R5383 Other fatigue: Secondary | ICD-10-CM | POA: Diagnosis not present

## 2016-05-05 DIAGNOSIS — M792 Neuralgia and neuritis, unspecified: Secondary | ICD-10-CM | POA: Diagnosis not present

## 2016-05-06 DIAGNOSIS — R202 Paresthesia of skin: Secondary | ICD-10-CM | POA: Diagnosis not present

## 2016-05-06 DIAGNOSIS — H547 Unspecified visual loss: Secondary | ICD-10-CM | POA: Diagnosis not present

## 2016-05-06 DIAGNOSIS — R201 Hypoesthesia of skin: Secondary | ICD-10-CM | POA: Diagnosis not present

## 2016-05-06 DIAGNOSIS — H538 Other visual disturbances: Secondary | ICD-10-CM | POA: Diagnosis not present

## 2016-05-06 DIAGNOSIS — R269 Unspecified abnormalities of gait and mobility: Secondary | ICD-10-CM | POA: Diagnosis not present

## 2016-05-06 DIAGNOSIS — R200 Anesthesia of skin: Secondary | ICD-10-CM | POA: Diagnosis not present

## 2016-05-13 DIAGNOSIS — G609 Hereditary and idiopathic neuropathy, unspecified: Secondary | ICD-10-CM | POA: Diagnosis not present

## 2016-05-13 DIAGNOSIS — A6920 Lyme disease, unspecified: Secondary | ICD-10-CM | POA: Diagnosis not present

## 2016-05-13 DIAGNOSIS — M810 Age-related osteoporosis without current pathological fracture: Secondary | ICD-10-CM | POA: Diagnosis not present

## 2016-05-13 DIAGNOSIS — K5900 Constipation, unspecified: Secondary | ICD-10-CM | POA: Diagnosis not present

## 2016-05-13 DIAGNOSIS — B379 Candidiasis, unspecified: Secondary | ICD-10-CM | POA: Diagnosis not present

## 2016-05-13 DIAGNOSIS — E559 Vitamin D deficiency, unspecified: Secondary | ICD-10-CM | POA: Diagnosis not present

## 2016-05-13 DIAGNOSIS — R5383 Other fatigue: Secondary | ICD-10-CM | POA: Diagnosis not present

## 2016-05-13 DIAGNOSIS — D709 Neutropenia, unspecified: Secondary | ICD-10-CM | POA: Diagnosis not present

## 2016-05-29 DIAGNOSIS — L820 Inflamed seborrheic keratosis: Secondary | ICD-10-CM | POA: Diagnosis not present

## 2016-06-03 DIAGNOSIS — H40219 Acute angle-closure glaucoma, unspecified eye: Secondary | ICD-10-CM | POA: Diagnosis not present

## 2016-06-03 DIAGNOSIS — K5900 Constipation, unspecified: Secondary | ICD-10-CM | POA: Diagnosis not present

## 2016-06-03 DIAGNOSIS — D709 Neutropenia, unspecified: Secondary | ICD-10-CM | POA: Diagnosis not present

## 2016-06-03 DIAGNOSIS — M810 Age-related osteoporosis without current pathological fracture: Secondary | ICD-10-CM | POA: Diagnosis not present

## 2016-06-04 DIAGNOSIS — F4322 Adjustment disorder with anxiety: Secondary | ICD-10-CM | POA: Diagnosis not present

## 2016-06-11 DIAGNOSIS — F4322 Adjustment disorder with anxiety: Secondary | ICD-10-CM | POA: Diagnosis not present

## 2016-06-17 DIAGNOSIS — G959 Disease of spinal cord, unspecified: Secondary | ICD-10-CM | POA: Diagnosis not present

## 2016-06-26 DIAGNOSIS — F4322 Adjustment disorder with anxiety: Secondary | ICD-10-CM | POA: Diagnosis not present

## 2016-06-30 DIAGNOSIS — F4322 Adjustment disorder with anxiety: Secondary | ICD-10-CM | POA: Diagnosis not present

## 2016-07-03 DIAGNOSIS — K5900 Constipation, unspecified: Secondary | ICD-10-CM | POA: Diagnosis not present

## 2016-07-03 DIAGNOSIS — E559 Vitamin D deficiency, unspecified: Secondary | ICD-10-CM | POA: Diagnosis not present

## 2016-07-03 DIAGNOSIS — M810 Age-related osteoporosis without current pathological fracture: Secondary | ICD-10-CM | POA: Diagnosis not present

## 2016-07-08 DIAGNOSIS — F4322 Adjustment disorder with anxiety: Secondary | ICD-10-CM | POA: Diagnosis not present

## 2016-07-11 ENCOUNTER — Encounter: Payer: Self-pay | Admitting: Neurology

## 2016-07-11 ENCOUNTER — Ambulatory Visit (INDEPENDENT_AMBULATORY_CARE_PROVIDER_SITE_OTHER): Payer: BLUE CROSS/BLUE SHIELD | Admitting: Neurology

## 2016-07-11 VITALS — BP 100/70 | HR 65 | Ht 69.5 in | Wt 131.5 lb

## 2016-07-11 DIAGNOSIS — R5382 Chronic fatigue, unspecified: Secondary | ICD-10-CM | POA: Diagnosis not present

## 2016-07-11 DIAGNOSIS — R209 Unspecified disturbances of skin sensation: Secondary | ICD-10-CM

## 2016-07-11 NOTE — Progress Notes (Signed)
Follow-up Visit   Date: 07/11/16    Megan [name] MRN: [id number] DOB: 05/05/1956   Interim History: Megan [name] is a 60 y.o. right-handed Caucasian female with history of osteoporosis, leukopenia, asthma, peripheral neuropathy of unknown etiology returning to the clinic with eye discomfort and generalized fatigue.    History of present illness: She moved to [place] to help her parents in 2009.  The home that she moved into had a lot of mold and in November 2009, she developed severe pneumonia and collapsed lung and was hospitalized at [facility]. She was doing well until 2012, when she developed bilateral feet swelling and burning pain. She went to see Sports Medicine, Podiatry, and PT for possible plantar fascitiis. It improved, but later that year, she developed cramping of the feet and discolaration. She saw [facility] for NCS/EMG which showed mild axonal peripheral neuropathy and was being followed by Dr. Maureen [name] who ordered additional labs and imaging of the brain, cervical and lumbar spine. Due to white matter changes seen on the brain and possibility of underlying vasculitis, she was given a two week trial of prednisone which did not change her symptoms. Labs were unrevealing (see below). Ultrasound of the lower extremity was negative.  She has had progressive imbalance, numbness, and burning pain involving the soles of the feet has remained. She also reports weakness of the legs. She was previously very active and went to the gym 7-days per week, but now goes 4-5 days per week with lighter routines because she is unable to lift as much and has problems with her balance.   She sought a second opinion at [facility] and saw Dr. Elias [name] in 2013. She underwent a second NCS/EMG which is interpreted as normal. Autoimmune, inflammatory labs, as well as paraneoplastic panel was negative.  In October 2014, she started seeing Dr. Berdine [name], neurologist, at  [facility] who repeated NCS/EMG which was interpreted as sensorimotor peripheral neuropathy with axonal and demyelinating features.   From a symptomatic standpoint, she has been offered neuralgesic medications such as gabapentin, but she prefers to be on as little medication as possible so is not taking anything.   She starting seeing me in 2015 for another opinion.  Labs here were positive for monoclonal lambda light chains.  Subsequent work-up by Dr. Beryle [name] in hematology/oncology did not show monoclonal gammopathy.  Her EMG did not show any evidence of peripheral neuropathy, there was a moderate right ulnar neuropathy, but denies any paresthesias of the right hand.  She continues seek a lot of alternative therapies with yoga, accupunture, integrative medication, and "energy" exercises.  She has been seeing a holistic doctor for recommendations also. She was seeing a naturalist in [place] who recommended that she slowly wean herself off her effexor.  She is taking effexor 37.5mg  and even though this is the smallest dose, she is so worried about withdrawal that she is actually opening the capsule and self tapering by counting the individual granules!   Regarding her neuropathy, she has underwent extensive testing with multiple neurologists (Dr. Maureen [name] at [facility], Dr. Berdine [name] at [place], and [facility]) which have suggested she has idiopathic peripheral neuropathy based on clinical symptoms. Previous evaluation has included several EMGs, some showing evidence of neuropathy and others which are normal. She is also underwent extensive laboratory testing including CRP, ANA, and color, C3, C4, VDRL, cryoglobulin, TPO antibody, thyroglobulin antibody, CMV, EBV, HIV, hepatitis panel, UPEP, and several paraneoplastic antibody  panels which have been nondiagnostic.  Repeat electrodiagnostic testing peformed (2015) by myself does not show any evidence of large fiber  neuropathy.  She has right ulnar neuropathy which is asymptomatic.  Small fiber neuropathy is likely.    UPDATE 07/11/2016:  At her last visit, I referred patient to Megan [name] at the [facility] for her progressive neurological symptoms and was suggested that she may have primary progressive MS.  She had MRI cervical, MRI thoracic, SPEP and VEP which was normal.  She reports ongoing issues with chronic fatigue and blurry vision.  She also sought the opinion of a functional medicine specialist at [facility] who suggested she has chronic Lyme and mold as the underlying etiology.  She presents with multiple questions regarding whether I believe she has chronic Lyme and whether she should pursue a spinal tap, as recommended by Dr. Joesph [name], even though it may not change management.   Medications:  Current Outpatient Prescriptions on File Prior to Visit  Medication Sig Dispense Refill  . cholecalciferol (VITAMIN D) 1000 UNITS tablet Take 2,000 Units by mouth 2 (two) times a week.     . CYANOCOBALAMIN PO Take 1 tablet by mouth once a week.     . magnesium oxide (MAG-OX) 400 MG tablet Take 400 mg by mouth 2 (two) times daily.    . Misc Natural Products (CHLORELLA) 500 MG CAPS Take 500 mg by mouth daily.    . Multiple Vitamin (MULTIVITAMIN WITH MINERALS) TABS Take 1 tablet by mouth daily.     No current facility-administered medications on file prior to visit.     Allergies: No Known Allergies   Review of Systems:  CONSTITUTIONAL: No fevers, chills, night sweats, or weight loss.   EYES: No visual changes or eye pain ENT: No hearing changes.  No history of nose bleeds.   RESPIRATORY: No cough, wheezing and shortness of breath.   CARDIOVASCULAR: Negative for chest pain, and palpitations.   GI: Negative for abdominal discomfort, blood in stools or black stools.  No recent change in bowel habits.   GU:  No history of incontinence.   MUSCLOSKELETAL: + history of joint pain or swelling.  No  myalgias.   SKIN: Negative for lesions, rash, and itching.   ENDOCRINE: Negative for cold or heat intolerance, polydipsia or goiter.   PSYCH:  +depression or anxiety symptoms.   NEURO: As Above.   Vital Signs:  BP 100/70   Pulse 65   Ht 5' 9.5" (1.765 m)   Wt 131 lb 8 oz (59.6 kg)   SpO2 98%   BMI 19.14 kg/m   Neurological Exam: MENTAL STATUS including orientation to time, place, person, recent and remote memory, attention span and concentration, language, and fund of knowledge is normal.  Speech is not dysarthric.  CRANIAL NERVES: Normal visual field defects. Pupils equal round and reactive to light.  Normal conjugate, extra-ocular eye movements in all directions of gaze.  No ptosis. Normal facial sensation.  Face is symmetric. Palate elevates symmetrically.  Tongue is midline.  MOTOR:  Motor strength is 5/5 in all extremities.   No pronator drift.  Tone is normal.   MSRs:  Reflexes are 2+/4 throughout.  Plantars are down going.   SENSORY:  Intact to vibration, mildly reduced at the great toe  COORDINATION/GAIT:  Gait narrow based and stable.    Data: MRI brain 05/16/2014: 1. No evidence of acute intracranial abnormality or mass.  2. Mild chronic small vessel ischemic disease.  3. Incidental pineal cyst, unchanged.  MRI brain 03/25/2015: 1. Stable scattered T2 hyperintensities, slightly advanced for age.  The finding is nonspecific but can be seen in the setting of chronic microvascular ischemia, a demyelinating process such as multiple sclerosis, vasculitis, complicated migraine headaches, or as the sequelae of a prior infectious or inflammatory process. 2. Stable benign appearing pineal cyst. 3. No acute intracranial abnormality or significant interval change.  Labs 12/06/2013:  2-hr glucose tolerance test, vitamin B12 932, vitamin B1 32, vitamin E 14.6, vitamin B6 29.9, zinc 87, copper 124, MMA 64*, folate >20, CRP <0.5, SPEP - no M protein, UPEP + monoclonal free Lambda  light chains, ENA negative, ANA negative  Labs 10/25/2013: Sodium 143, potassium 4.2, calcium 9.5, AST 23, ALT 8, free T4 0.98, antithyroglobulin antibody negative, CRP 0.24, TPO antibody negative  Labs 09/14/2012: Lead 1.7   Labs 2013: cryoglobulin neg, ANA negative Copper 116, ANCA negative especially, C3 101, C4 25, VDRL negative, paraneoplastic autoantibody negative, UPEP no M protein, CMV, EBV, HIV, hep acute panel   MRI brain 01/29/2012: Mild nonspecific white matter hyperintensities in the subcortical regions. 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   MRI cervical spine with and without contrast 01/29/2012: Prominent spondylitic changes at C5-6 and C6-7 without significant compression.   MRI of the lumbar spine without contrast 03/18/2012: Mild disc degenerative changes from L3-S1 with severe left-sided foraminal narrowing at L3-4 and L4-5 without definite root impingement   EMG performed at [facility] 10/15/2011: Mild distal sensory axonal neuropathy.  EMG performed at [facility]: Normal study. No evidence of large fiber sensory motor neuropathy.  EMG performed at [facility] bilateral lower extremity 06/02/2013: Significant sensorimotor axonal and demyelinating peripheral neuropathy. Upon my review, sensory and motor amplitudes are preserved the latency is normal. Late responses are prolonged (F-wave) and conduction velocity is borderline slow 38-42 m/s. Temperature limb was not recorded.  EMG performed at [facility] 03/14/2014:  1. Right ulnar neuropathy with slowing across the elbow, demyelinating and axon loss in type. Overall, these findings are moderate in degree electrically. 2. There is no evidence of a generalized sensorimotor polyradiculoneuropathy, cervical radiculopathy, or lumbosacral radiculopathy affecting the right side.  US carotids 04/24/2015:  Normal  Lab  Results  Component Value Date   [id number] 04/06/2015   [facility] Diagnostic testing: SPEP, VEP - normal MRI cervical and thoracic spine wwo contrast 03/22/2016:  normal  IMPRESSION/PLAN: Ms. Faucette is a 60 year old female with a constellation of progressive neurological symptoms returning to discuss her ongoing work-up.  I appreciate Dr. Lorain Childes evaluation and management of possible primary progressive MS.  I explained that a spinal tap would aide in the accurate diagnosis, especially if there is evidence of inflammatory changes and I would recommend that she proceed with this testing.  I agree with repeat MRI brain wwo contrast to look for worsening disease burden.  Regarding chronic Lyme disease, I do not favor this as a diagnosis to explain the multitude of symptoms.  She may seek the expert opinion of Infectious Disease provider, if she chooses. She would like to transition her physical therapy locally and I suggested [facility] or [facility].  Continue follow-up with Dr. Joesph [name] at [place]     The duration of this appointment visit was 30 minutes of face-to-face time with the patient.  Greater than 50% of this time was spent in  counseling, explanation of diagnosis, planning of further management, and coordination of care.   Thank you for allowing me to participate in patient's care.  If I can answer any additional questions, I would be pleased to do so.    Sincerely,    Vannie Hilgert K. Posey Pronto, DO

## 2016-07-23 DIAGNOSIS — H01021 Squamous blepharitis right upper eyelid: Secondary | ICD-10-CM | POA: Diagnosis not present

## 2016-07-23 DIAGNOSIS — H01022 Squamous blepharitis right lower eyelid: Secondary | ICD-10-CM | POA: Diagnosis not present

## 2016-07-23 DIAGNOSIS — H01025 Squamous blepharitis left lower eyelid: Secondary | ICD-10-CM | POA: Diagnosis not present

## 2016-07-23 DIAGNOSIS — H01024 Squamous blepharitis left upper eyelid: Secondary | ICD-10-CM | POA: Diagnosis not present

## 2016-07-25 DIAGNOSIS — G959 Disease of spinal cord, unspecified: Secondary | ICD-10-CM | POA: Diagnosis not present

## 2016-07-28 DIAGNOSIS — F4322 Adjustment disorder with anxiety: Secondary | ICD-10-CM | POA: Diagnosis not present

## 2016-08-04 DIAGNOSIS — R1011 Right upper quadrant pain: Secondary | ICD-10-CM | POA: Diagnosis not present

## 2016-08-08 ENCOUNTER — Emergency Department (HOSPITAL_COMMUNITY): Payer: BLUE CROSS/BLUE SHIELD

## 2016-08-08 ENCOUNTER — Encounter (HOSPITAL_COMMUNITY): Payer: Self-pay | Admitting: Emergency Medicine

## 2016-08-08 ENCOUNTER — Emergency Department (HOSPITAL_COMMUNITY)
Admission: EM | Admit: 2016-08-08 | Discharge: 2016-08-08 | Disposition: A | Discharge status: Home / Self Care | Payer: BLUE CROSS/BLUE SHIELD | Attending: Emergency Medicine | Admitting: Emergency Medicine

## 2016-08-08 DIAGNOSIS — J45909 Unspecified asthma, uncomplicated: Secondary | ICD-10-CM | POA: Insufficient documentation

## 2016-08-08 DIAGNOSIS — S299XXA Unspecified injury of thorax, initial encounter: Secondary | ICD-10-CM | POA: Diagnosis present

## 2016-08-08 DIAGNOSIS — R1011 Right upper quadrant pain: Secondary | ICD-10-CM | POA: Insufficient documentation

## 2016-08-08 DIAGNOSIS — Y939 Activity, unspecified: Secondary | ICD-10-CM | POA: Insufficient documentation

## 2016-08-08 DIAGNOSIS — X58XXXA Exposure to other specified factors, initial encounter: Secondary | ICD-10-CM | POA: Insufficient documentation

## 2016-08-08 DIAGNOSIS — Y999 Unspecified external cause status: Secondary | ICD-10-CM | POA: Insufficient documentation

## 2016-08-08 DIAGNOSIS — S2231XA Fracture of one rib, right side, initial encounter for closed fracture: Secondary | ICD-10-CM | POA: Diagnosis not present

## 2016-08-08 DIAGNOSIS — Y929 Unspecified place or not applicable: Secondary | ICD-10-CM | POA: Insufficient documentation

## 2016-08-08 DIAGNOSIS — E039 Hypothyroidism, unspecified: Secondary | ICD-10-CM | POA: Insufficient documentation

## 2016-08-08 DIAGNOSIS — R109 Unspecified abdominal pain: Secondary | ICD-10-CM | POA: Diagnosis not present

## 2016-08-08 HISTORY — DX: Lyme disease, unspecified: A69.20

## 2016-08-08 LAB — CBC WITH DIFFERENTIAL/PLATELET
Basophils Absolute: 0 10*3/uL (ref 0.0–0.1)
Basophils Relative: 1 %
Eosinophils Absolute: 0 10*3/uL (ref 0.0–0.7)
Eosinophils Relative: 2 %
HCT: 43.5 % (ref 36.0–46.0)
Hemoglobin: 14.6 g/dL (ref 12.0–15.0)
Lymphocytes Relative: 34 %
Lymphs Abs: 0.9 10*3/uL (ref 0.7–4.0)
MCH: 30.5 pg (ref 26.0–34.0)
MCHC: 33.6 g/dL (ref 30.0–36.0)
MCV: 90.8 fL (ref 78.0–100.0)
Monocytes Absolute: 0.2 10*3/uL (ref 0.1–1.0)
Monocytes Relative: 8 %
Neutro Abs: 1.5 10*3/uL — ABNORMAL LOW (ref 1.7–7.7)
Neutrophils Relative %: 55 %
Platelets: 192 10*3/uL (ref 150–400)
RBC: 4.79 MIL/uL (ref 3.87–5.11)
RDW: 12.5 % (ref 11.5–15.5)
WBC: 2.7 10*3/uL — ABNORMAL LOW (ref 4.0–10.5)

## 2016-08-08 LAB — TROPONIN I: Troponin I: <0.03 ng/mL (ref <0.03)

## 2016-08-08 LAB — COMPREHENSIVE METABOLIC PANEL
ALT: 18 U/L (ref 14–54)
AST: 36 U/L (ref 15–41)
Albumin: 4.2 g/dL (ref 3.5–5.0)
Alkaline Phosphatase: 83 U/L (ref 38–126)
Anion gap: 7 (ref 5–15)
BUN: 10 mg/dL (ref 6–20)
CO2: 29 mmol/L (ref 22–32)
Calcium: 9.8 mg/dL (ref 8.9–10.3)
Chloride: 105 mmol/L (ref 101–111)
Creatinine, Ser: 0.7 mg/dL (ref 0.44–1.00)
GFR calc Af Amer: >60 mL/min (ref >60)
GFR calc non Af Amer: >60 mL/min (ref >60)
Glucose, Bld: 95 mg/dL (ref 65–99)
Potassium: 4.4 mmol/L (ref 3.5–5.1)
Sodium: 141 mmol/L (ref 135–145)
Total Bilirubin: 0.8 mg/dL (ref 0.3–1.2)
Total Protein: 6.6 g/dL (ref 6.5–8.1)

## 2016-08-08 LAB — LIPASE, BLOOD: Lipase: 42 U/L (ref 11–51)

## 2016-08-08 MED ORDER — TRAMADOL HCL 50 MG PO TABS: 50.0000 mg | ORAL_TABLET | Freq: Four times a day (QID) | ORAL | 0 refills | Status: DC | PRN

## 2016-08-08 MED ORDER — SODIUM CHLORIDE 0.9 % IV SOLN
INTRAVENOUS | Status: DC
  Administered 2016-08-08: 12:00:00 via INTRAVENOUS

## 2016-08-08 MED ORDER — NAPROXEN 500 MG PO TABS: 500.0000 mg | ORAL_TABLET | Freq: Two times a day (BID) | ORAL | 1 refills | Status: DC

## 2016-08-08 MED ORDER — SODIUM CHLORIDE 0.9 % IV BOLUS (SEPSIS)
500.0000 mL | Freq: Once | INTRAVENOUS | Status: AC
  Administered 2016-08-08: 500 mL via INTRAVENOUS

## 2016-08-08 MED ORDER — ONDANSETRON HCL 4 MG/2ML IJ SOLN
4.0000 mg | Freq: Once | INTRAMUSCULAR | Status: AC
  Administered 2016-08-08: 4 mg via INTRAVENOUS
  Filled 2016-08-08: qty 2

## 2016-08-08 MED ORDER — IOPAMIDOL (ISOVUE-300) INJECTION 61%
INTRAVENOUS | Status: AC
  Administered 2016-08-08: 100 mL
  Filled 2016-08-08: qty 100

## 2016-08-08 NOTE — ED Notes (Signed)
Attempted PIV x2

## 2016-08-08 NOTE — ED Provider Notes (Signed)
[facility] DEPT Provider Note   CSN: [id number] Arrival date & time: 08/08/16  [id number]     History   Chief Complaint Chief Complaint  Patient presents with  . Abdominal Pain    HPI Megan [name] is a 60 y.o. female.  Patient with one-week history of right upper quadrant abdominal pain right at the rib margin. No history of trauma or fall. Pain is been constant ache but at times gets very severe. Associated with some nausea feeling of fatigue. No fevers no vomiting. Patient states no chest pain no shortness of breath.      Past Medical History:  Diagnosis Date  . Asthma   . Depression   . Hypothyroidism   . Lyme disease   . MVP (mitral valve prolapse)   . Orthostatic hypotension   . Osteoporosis   . Peripheral neuropathy ([place]) 06/09/2012  . Plantar fasciitis   . Pneumothorax     Patient Active Problem List   Diagnosis Date Noted  . Chronic fatigue 03/05/2016  . Neuropathy associated with MGUS ([place]) 12/26/2013  . Orthostatic hypotension 08/01/2013  . Mitral valve prolapse 08/01/2013  . Spider veins of limb 02/15/2013  . Peripheral neuropathy ([place]) 06/09/2012  . Leukopenia 05/13/2012  . Bilateral Dorsal Foot pain 04/23/2011  . Gait abnormality 04/23/2011  . Leg length inequality 04/23/2011  . ASTHMA 11/16/2008  . OSTEOPOROSIS 10/19/2008  . DYSPNEA 10/18/2008  . PNEUMONIA 07/24/2008  . Nonspecific (abnormal) findings on radiological and other examination of body structure 07/24/2008  . ABNORMAL CHEST XRAY 07/24/2008    Past Surgical History:  Procedure Laterality Date  . COLONOSCOPY WITH PROPOFOL N/A 02/22/2013   Procedure: COLONOSCOPY WITH PROPOFOL;  Surgeon: Garlan Fair, MD;  Location: [facility];  Service: Endoscopy;  Laterality: N/A;  . MOUTH SURGERY  01/2012   bone graft in mouth  . NASAL SINUS SURGERY Right 06/12/2014   Procedure: RIGHT ENDOSCOPIC MAXILLARY ANTROSTOMY;  Surgeon: Ascencion Dike, MD;  Location: [facility];   Service: ENT;  Laterality: Right;  . ROOT CANAL  02/27/2012  . TONSILLECTOMY  1962    OB History    No data available       Home Medications    Prior to Admission medications   Medication Sig Start Date End Date Taking? Authorizing Provider  5-Hydroxytryptophan (5-HTP) 50 MG CAPS Take 50 mg by mouth daily.   Yes Historical Provider, MD  Alpha-Lipoic Acid 300 MG TABS Take 600 mg by mouth daily.   Yes Historical Provider, MD  Cholecalciferol (VITAMIN D3) 10000 units capsule Take 10,000 Units by mouth daily.   Yes Historical Provider, MD  cholestyramine (QUESTRAN) 4 g packet Take 0.25 packets by mouth daily.   Yes Historical Provider, MD  Coenzyme Q10 (CO Q 10) 100 MG CAPS Take 200 mg by mouth daily.   Yes Historical Provider, MD  Digestive Enzymes (DIGESTIVE ENZYME PO) Take 1 tablet by mouth daily.   Yes Historical Provider, MD  GLUTATHIONE PO Take 1 capsule by mouth daily.   Yes Historical Provider, MD  magnesium oxide (MAG-OX) 400 MG tablet Take 400 mg by mouth daily.    Yes Historical Provider, MD  Menaquinone-7 (VITAMIN K2 PO) Take 1 tablet by mouth daily.   Yes Historical Provider, MD  Misc Natural Products (CHLORELLA) 500 MG CAPS Take 500 mg by mouth daily.   Yes Historical Provider, MD  Multiple Vitamin (MULTIVITAMIN WITH MINERALS) TABS Take 1 tablet by mouth daily.   Yes Historical Provider, MD  NATURE-THROID 32.5 MG tablet Take 32.5 mg by mouth daily. 07/12/16  Yes Historical Provider, MD  NON FORMULARY Take 2 tablets by mouth daily. "NT Energy Factor"   Yes Historical Provider, MD  NON FORMULARY Take 3 tablets by mouth daily. "Energy Multi Flex"   Yes Historical Provider, MD  NON FORMULARY Take 1 tablet by mouth daily. "Biocidin"   Yes Historical Provider, MD  NON FORMULARY Take 1 tablet by mouth daily. "GI Benefits" supplement   Yes Historical Provider, MD  NON FORMULARY Take 1 each by mouth daily. "Medi Clay" detox clay   Yes Historical Provider, MD  nystatin (MYCOSTATIN)  500000 units TABS tablet Take 15,000 Units by mouth daily. 07/05/16  Yes Historical Provider, MD  Omega-3 Fatty Acids (FISH OIL) 1000 MG CAPS Take 1,000 mg by mouth daily.   Yes Historical Provider, MD  naproxen (NAPROSYN) 500 MG tablet Take 1 tablet (500 mg total) by mouth 2 (two) times daily. 08/08/16   Fredia Sorrow, MD  traMADol (ULTRAM) 50 MG tablet Take 1 tablet (50 mg total) by mouth every 6 (six) hours as needed. 08/08/16   Fredia Sorrow, MD    Family History Family History  Problem Relation Age of Onset  . Hyperlipidemia Father   . Heart failure Father   . Prostate cancer Father   . Kidney failure Father   . Hypertension Father   . Angina Mother   . Hypertension Mother   . Lung cancer Mother   . Colon cancer Mother   . Lung cancer Paternal Grandfather   . Breast cancer Paternal Grandmother   . Diabetes Paternal Grandmother     Social History Social History  Substance Use Topics  . Smoking status: Never Smoker  . Smokeless tobacco: Never Used  . Alcohol use No     Allergies   Patient has no known allergies.   Review of Systems Review of Systems  Constitutional: Positive for fatigue. Negative for fever.  HENT: Negative for congestion.   Eyes: Negative for visual disturbance.  Respiratory: Negative for shortness of breath.   Cardiovascular: Negative for chest pain.  Gastrointestinal: Positive for abdominal pain and nausea.  Genitourinary: Negative for dysuria.  Neurological: Negative for headaches.  Hematological: Does not bruise/bleed easily.  Psychiatric/Behavioral: Negative for confusion.     Physical Exam Updated Vital Signs BP 106/73   Pulse 75   Temp 98.7 F (37.1 C) (Oral)   Resp 20   SpO2 100%   Physical Exam  Constitutional: She is oriented to person, place, and time. She appears well-developed and well-nourished. No distress.  HENT:  Head: Normocephalic and atraumatic.  Mouth/Throat: Oropharynx is clear and moist.  Eyes: EOM are  normal. Pupils are equal, round, and reactive to light.  Neck: Normal range of motion. Neck supple.  Cardiovascular: Normal rate, regular rhythm and normal heart sounds.   Pulmonary/Chest: Effort normal and breath sounds normal.  Abdominal: Soft. Bowel sounds are normal. There is tenderness.  Tenderness right upper quadrant. No tenderness along ribs.  Musculoskeletal: Normal range of motion. She exhibits no edema.  Neurological: She is alert and oriented to person, place, and time. No cranial nerve deficit or sensory deficit. She exhibits normal muscle tone. Coordination normal.  Skin: Skin is warm.  Nursing note and vitals reviewed.    ED Treatments / Results  Labs (all labs ordered are listed, but only abnormal results are displayed) Labs Reviewed  CBC WITH DIFFERENTIAL/PLATELET - Abnormal; Notable for the following:  Result Value   WBC 2.7 (*)    Neutro Abs 1.5 (*)    All other components within normal limits  COMPREHENSIVE METABOLIC PANEL  LIPASE, BLOOD  TROPONIN I   Results for orders placed or performed during the hospital encounter of 08/08/16  CBC with Differential/Platelet  Result Value Ref Range   WBC 2.7 (L) 4.0 - 10.5 K/uL   RBC 4.79 3.87 - 5.11 MIL/uL   Hemoglobin 14.6 12.0 - 15.0 g/dL   HCT 43.5 36.0 - 46.0 %   MCV 90.8 78.0 - 100.0 fL   MCH 30.5 26.0 - 34.0 pg   MCHC 33.6 30.0 - 36.0 g/dL   RDW 12.5 11.5 - 15.5 %   Platelets 192 150 - 400 K/uL   Neutrophils Relative % 55 %   Neutro Abs 1.5 (L) 1.7 - 7.7 K/uL   Lymphocytes Relative 34 %   Lymphs Abs 0.9 0.7 - 4.0 K/uL   Monocytes Relative 8 %   Monocytes Absolute 0.2 0.1 - 1.0 K/uL   Eosinophils Relative 2 %   Eosinophils Absolute 0.0 0.0 - 0.7 K/uL   Basophils Relative 1 %   Basophils Absolute 0.0 0.0 - 0.1 K/uL  Comprehensive metabolic panel  Result Value Ref Range   Sodium 141 135 - 145 mmol/L   Potassium 4.4 3.5 - 5.1 mmol/L   Chloride 105 101 - 111 mmol/L   CO2 29 22 - 32 mmol/L   Glucose,  Bld 95 65 - 99 mg/dL   BUN 10 6 - 20 mg/dL   Creatinine, Ser 0.70 0.44 - 1.00 mg/dL   Calcium 9.8 8.9 - 10.3 mg/dL   Total Protein 6.6 6.5 - 8.1 g/dL   Albumin 4.2 3.5 - 5.0 g/dL   AST 36 15 - 41 U/L   ALT 18 14 - 54 U/L   Alkaline Phosphatase 83 38 - 126 U/L   Total Bilirubin 0.8 0.3 - 1.2 mg/dL   GFR calc non Af Amer >60 >60 mL/min   GFR calc Af Amer >60 >60 mL/min   Anion gap 7 5 - 15  Lipase, blood  Result Value Ref Range   Lipase 42 11 - 51 U/L  Troponin I  Result Value Ref Range   Troponin I <0.03 <0.03 ng/mL     EKG  EKG Interpretation  Date/Time:  Friday August 08 2016 10:09:55 EST Ventricular Rate:  62 PR Interval:    QRS Duration: 101 QT Interval:  437 QTC Calculation: 444 R Axis:   87 Text Interpretation:  Sinus or ectopic atrial rhythm Probable lateral infarct, age indeterminate No significant change since last tracing Confirmed by Olanda Downie  MD, Danaka Llera [phone]) on 08/08/2016 10:14:40 AM       Radiology Dg Ribs Unilateral W/chest Right  Result Date: 08/08/2016 CLINICAL DATA:  Right chest pain.  Abdominal pain. EXAM: RIGHT RIBS AND CHEST - 3+ VIEW COMPARISON:  CT abdomen pelvis 08/08/2016 FINDINGS: Pulmonary hyperinflation with probable chronic lung disease. Negative for infiltrate or effusion. No pneumothorax Nondisplaced fracture ninth anterior rib.  No other rib fracture. IMPRESSION: Nondisplaced fracture right anterior ninth rib. No acute cardiopulmonary abnormality. Probable chronic lung disease. Electronically Signed   By: Franchot Gallo M.D.   On: 08/08/2016 13:04   US Abdomen Complete  Result Date: 08/08/2016 CLINICAL DATA:  RIGHT-side pain for 1 week, nausea EXAM: ABDOMEN ULTRASOUND COMPLETE COMPARISON:  11/22/2015 FINDINGS: Gallbladder: Normally distended without gallbladder wall thickening or pericholecystic fluid. No sonographic Murphy sign. No definite gallstones. Common bile  duct: Diameter: 5 mm diameter, normal Liver: Normal appearance IVC:  Normal appearance Pancreas: Normal appearance Spleen: Normal appearance, 6.7 cm length Right Kidney: Length: 11.4 cm. Normal morphology without mass or hydronephrosis. Left Kidney: Length: 11.7. Normal morphology without mass or hydronephrosis. Abdominal aorta:  Normal caliber Other findings:  No free fluid. IMPRESSION: Normal exam. Electronically Signed   By: Lavonia Dana M.D.   On: 08/08/2016 10:12   Ct Abdomen Pelvis W Contrast  Result Date: 08/08/2016 CLINICAL DATA:  Right upper quadrant abdominal pain with nausea for 1 week. EXAM: CT ABDOMEN AND PELVIS WITH CONTRAST TECHNIQUE: Multidetector CT imaging of the abdomen and pelvis was performed using the standard protocol following bolus administration of intravenous contrast. CONTRAST:  121mL ISOVUE-300 IOPAMIDOL (ISOVUE-300) INJECTION 61% COMPARISON:  Abdominal ultrasound 11/22/2015 and 08/08/2016. FINDINGS: Lower chest: Mild dependent atelectasis at both lung bases. There is no significant pleural or pericardial effusion. Hepatobiliary: The liver is normal in density without focal abnormality. No evidence of gallstones, gallbladder wall thickening or biliary dilatation. Pancreas: Unremarkable. No pancreatic ductal dilatation or surrounding inflammatory changes. Spleen: Normal in size without focal abnormality. There are small splenules. Adrenals/Urinary Tract: Both adrenal glands appear normal. Both kidneys demonstrate mild cortical lobularity, but no mass, hydronephrosis or urinary tract calculus. The bladder is nearly empty without apparent abnormality. Stomach/Bowel: No evidence of bowel wall thickening, distention or surrounding inflammatory change. The appendix appears normal. There is moderate stool throughout the colon. Vascular/Lymphatic: There are no enlarged abdominal or pelvic lymph nodes. No acute vascular findings are demonstrated. There is duplication of the IVC (normal variant). Reproductive: The uterus appears relatively small. No evidence of  adnexal mass or pelvic inflammatory change. Other: No evidence of abdominal wall mass or hernia. No ascites. Musculoskeletal: No acute or significant osseous findings. Degenerative disc disease at L5-S1. IMPRESSION: No acute findings or explanation for the patient's symptoms. Electronically Signed   By: Richardean Sale M.D.   On: 08/08/2016 12:53    Procedures Procedures (including critical care time)  Medications Ordered in ED Medications  0.9 %  sodium chloride infusion ( Intravenous New Bag/Given 08/08/16 1208)  sodium chloride 0.9 % bolus 500 mL (0 mLs Intravenous Stopped 08/08/16 1323)  ondansetron (ZOFRAN) injection 4 mg (4 mg Intravenous Given 08/08/16 1208)  iopamidol (ISOVUE-300) 61 % injection (100 mLs  Contrast Given 08/08/16 1230)     Initial Impression / Assessment and Plan / ED Course  I have reviewed the triage vital signs and the nursing notes.  Pertinent labs & imaging results that were available during my care of the patient were reviewed by me and considered in my medical decision making (see chart for details).  Clinical Course     Symptoms clinically were very concerning for cholelithiasis. Ultrasound negative CT abdomen pelvis without any acute abdominal findings. Labs also normal to include lipase and liver function tests. So chest x-ray with rib series was done which shows a ninth right anterior rib fracture this is most likely the cause of the pain. Patient without any evidence or known history of trauma however she does have a 50 pound dog but sometimes does walk on her on the bed. This could be the cause. No other acute findings. Patient stable for discharge home. Patient will be treated symptomatically. No underlying lung abnormalities.  Final Clinical Impressions(s) / ED Diagnoses   Final diagnoses:  Closed fracture of one rib of right side, initial encounter    New Prescriptions New Prescriptions   NAPROXEN (NAPROSYN) 500  MG TABLET    Take 1 tablet (500  mg total) by mouth 2 (two) times daily.   TRAMADOL (ULTRAM) 50 MG TABLET    Take 1 tablet (50 mg total) by mouth every 6 (six) hours as needed.     Fredia Sorrow, MD 08/08/16 1400

## 2016-08-08 NOTE — ED Notes (Signed)
Patient transported to Ultrasound 

## 2016-08-08 NOTE — ED Notes (Signed)
Patient transported to X-ray and CT 

## 2016-08-08 NOTE — ED Triage Notes (Signed)
Pt from home with ongoing c/o RUQ pain/rib pain starting last Fri.  Pt reports it is worse with palpation.  Saw PCP who ran blood work which looked find and told her if the pain continued he may start her on prednisone for chest wall contusion.  Pt states PCP went out of town and she could not sleep last night.  NAD, A&O, ambulatory.

## 2016-08-08 NOTE — Discharge Instructions (Signed)
Take medications as directed and as needed. Workup shows evidence of a right rib fracture at the location of your pain. Rest of workup shows no gallbladder or abdominal problems. Follow-up with your doctor as needed. Return for any new or worse symptoms.

## 2016-08-12 DIAGNOSIS — F4322 Adjustment disorder with anxiety: Secondary | ICD-10-CM | POA: Diagnosis not present

## 2016-08-20 DIAGNOSIS — E039 Hypothyroidism, unspecified: Secondary | ICD-10-CM | POA: Diagnosis not present

## 2016-08-20 DIAGNOSIS — Z01419 Encounter for gynecological examination (general) (routine) without abnormal findings: Secondary | ICD-10-CM | POA: Diagnosis not present

## 2016-08-20 DIAGNOSIS — Z113 Encounter for screening for infections with a predominantly sexual mode of transmission: Secondary | ICD-10-CM | POA: Diagnosis not present

## 2016-08-20 DIAGNOSIS — Z681 Body mass index (BMI) 19 or less, adult: Secondary | ICD-10-CM | POA: Diagnosis not present

## 2016-08-20 LAB — HM PAP SMEAR: HM Pap smear: NEGATIVE

## 2016-08-27 ENCOUNTER — Encounter: Payer: Self-pay | Admitting: Infectious Diseases

## 2016-08-27 ENCOUNTER — Ambulatory Visit (INDEPENDENT_AMBULATORY_CARE_PROVIDER_SITE_OTHER): Payer: BLUE CROSS/BLUE SHIELD | Admitting: Infectious Diseases

## 2016-08-27 DIAGNOSIS — R5382 Chronic fatigue, unspecified: Secondary | ICD-10-CM | POA: Diagnosis not present

## 2016-08-27 NOTE — Progress Notes (Signed)
Subjective:    Patient ID: Megan [name], female    DOB: March 15, 1956, 61 y.o.   MRN: [id number]  HPI 61 yo F with ? Of MS here for evaluation for Lyme disease.  Please see my note from 02-2016.  Below is an annotation of from the most recent neurology note.  She was previously living in [place], [place] and then moved [place] to help her parents in 2009.  By 2012, she developed bilateral feet swelling and burning pain. She saw [facility] for NCS/EMG which showed mild axonal peripheral neuropathy and was being followed by Dr. Maureen Chatters who ordered additional labs and imaging of the brain, cervical and lumbar spine. Due to white matter changes seen on the brain and possibility of underlying vasculitis, she was given a two week trial of prednisone which did not change her symptoms. She has had progressive imbalance, numbness, and burning pain involving the soles of the feet has remained. She also reports weakness of the legs. She sought a second opinion at [facility] and saw Dr. Elias Else in 61 2013. She underwent a second NCS/EMG which is interpreted as normal. Autoimmune, inflammatory labs, as well as paraneoplastic panel was negative.  In October 2014, she started seeing Dr. Berdine Addison, neurologist, at [facility] who repeated NCS/EMG which was interpreted as sensorimotor peripheral neuropathy with axonal and demyelinating features.    She saw [facility] in 2015 for another opinion.  Labs here were positive for monoclonal lambda light chains.  Subsequent work-up in hematology/oncology did not show monoclonal gammopathy.  Though her EMG did not show any evidence of peripheral neuropathy, there was a moderate right ulnar neuropathy.   Regarding her neuropathy, she has underwent extensive testing with multiple neurologists (Dr. Maureen Chatters at [facility], Dr. Berdine Addison at [place], and [facility]) which have suggested she has idiopathic peripheral  neuropathy based on clinical symptoms. Previous evaluation has included several EMGs, some showing evidence of neuropathy and others which are normal. She is also underwent extensive laboratory testing including CRP, ANA, and color, C3, C4, VDRL, cryoglobulin, TPO antibody, thyroglobulin antibody, CMV, EBV, HIV, hepatitis panel, UPEP, and several paraneoplastic antibody panels which have been nondiagnostic.  Repeat electrodiagnostic testing peformed (2015) does not show any evidence of large fiber neuropathy.  She has right ulnar neuropathy which is asymptomatic.  Small fiber neuropathy is likely.    She was refered to the [facility] for her progressive neurological symptoms and was suggested that she may have primary progressive MS.  She had MRI cervical, MRI thoracic, SPEP and VEP which was normal.  She reports ongoing issues with chronic fatigue and blurry vision.  She also sought the opinion of a functional medicine specialist at [facility] who suggested she has chronic Lyme and mold as the underlying etiology.  She had LP at [facility] on 07-25-16 that did not show oligoclonal bands.  She had CSF IgG, IgM for Lyme which she reports was negative.   She has had serologies done by her functional medicine doctor and has negative Lyme elisa, Lyme western blot showed only 1 positive band of (IgM needs at least 2) and 4 IgG bands (need 5). She also has a IgG+ for Babesia ducani (prev reported in [place] and [place])  She has been having fatigue and weakness, still not back to normal.  Was doing spin 3/week, yoga 4-5/week. Walking 3 miles 5/week. Has had to give up spin. Still doing yoga, more gentle. Unable to  walk as much, as frequent.   Review of Systems  Constitutional: Negative for appetite change, chills, fever and unexpected weight change.  Eyes: Positive for visual disturbance.  Musculoskeletal: Negative for arthralgias and myalgias.  Skin: Positive for rash.  does get cramping in her calves.  Has some hip pain.  Has seen ophtho- possible optic roseacea- she deferred anbx for 6 weeks.  Gets transient raised, red bumps on her abd. Looks like she has measles some times. Not currently active, no new soaps or creams. Has been eval by derm.     Objective:   Physical Exam  Constitutional: She is oriented to person, place, and time. She appears well-developed and well-nourished.  HENT:  Mouth/Throat: No oropharyngeal exudate.  Eyes: EOM are normal. Pupils are equal, round, and reactive to light.  Neck: Neck supple.  Cardiovascular: Normal rate, regular rhythm and normal heart sounds.   Pulmonary/Chest: Effort normal and breath sounds normal.  Abdominal: Soft. Bowel sounds are normal. There is no tenderness. There is no rebound.  Lymphadenopathy:    She has no cervical adenopathy.  Neurological: She is alert and oriented to person, place, and time. No cranial nerve deficit. She exhibits normal muscle tone.  Skin: No rash noted.       Assessment & Plan:

## 2016-08-27 NOTE — Assessment & Plan Note (Signed)
We spoke at length about her tests and her sx. I suggested that she does not have Lyme based on CDC criteria. She also does not have babesia.  She is frustrated that she does not have a dx.  I cautioned her about the use of alternative rx's and diagnostics. Many/most are unregulated and they can be very expensive. As well, chinese herbals (Zhang for instance) have in the past been associated with hepatotoxicity.  She is not convinced that IV ozone will be useful for her. I supported her in this.  I offered to be available if she has further questions.  I encouraged her to f/u with neuro.   Se can f/u prn.

## 2016-08-28 DIAGNOSIS — F4322 Adjustment disorder with anxiety: Secondary | ICD-10-CM | POA: Diagnosis not present

## 2016-09-17 DIAGNOSIS — F4322 Adjustment disorder with anxiety: Secondary | ICD-10-CM | POA: Diagnosis not present

## 2016-09-24 DIAGNOSIS — F4322 Adjustment disorder with anxiety: Secondary | ICD-10-CM | POA: Diagnosis not present

## 2016-09-25 DIAGNOSIS — H01001 Unspecified blepharitis right upper eyelid: Secondary | ICD-10-CM | POA: Diagnosis not present

## 2016-09-25 DIAGNOSIS — H04123 Dry eye syndrome of bilateral lacrimal glands: Secondary | ICD-10-CM | POA: Diagnosis not present

## 2016-09-25 DIAGNOSIS — H2513 Age-related nuclear cataract, bilateral: Secondary | ICD-10-CM | POA: Diagnosis not present

## 2016-09-25 DIAGNOSIS — H01005 Unspecified blepharitis left lower eyelid: Secondary | ICD-10-CM | POA: Diagnosis not present

## 2016-10-01 DIAGNOSIS — F4322 Adjustment disorder with anxiety: Secondary | ICD-10-CM | POA: Diagnosis not present

## 2016-10-09 DIAGNOSIS — F4322 Adjustment disorder with anxiety: Secondary | ICD-10-CM | POA: Diagnosis not present

## 2016-10-09 DIAGNOSIS — E039 Hypothyroidism, unspecified: Secondary | ICD-10-CM | POA: Diagnosis not present

## 2016-10-10 DIAGNOSIS — L814 Other melanin hyperpigmentation: Secondary | ICD-10-CM | POA: Diagnosis not present

## 2016-10-10 DIAGNOSIS — Z85828 Personal history of other malignant neoplasm of skin: Secondary | ICD-10-CM | POA: Diagnosis not present

## 2016-10-10 DIAGNOSIS — L111 Transient acantholytic dermatosis [Grover]: Secondary | ICD-10-CM | POA: Diagnosis not present

## 2016-10-10 DIAGNOSIS — L821 Other seborrheic keratosis: Secondary | ICD-10-CM | POA: Diagnosis not present

## 2016-10-15 DIAGNOSIS — F4322 Adjustment disorder with anxiety: Secondary | ICD-10-CM | POA: Diagnosis not present

## 2016-10-22 DIAGNOSIS — E559 Vitamin D deficiency, unspecified: Secondary | ICD-10-CM | POA: Diagnosis not present

## 2016-10-22 DIAGNOSIS — E039 Hypothyroidism, unspecified: Secondary | ICD-10-CM | POA: Diagnosis not present

## 2016-10-22 DIAGNOSIS — F4322 Adjustment disorder with anxiety: Secondary | ICD-10-CM | POA: Diagnosis not present

## 2016-10-27 DIAGNOSIS — Z803 Family history of malignant neoplasm of breast: Secondary | ICD-10-CM | POA: Diagnosis not present

## 2016-10-27 DIAGNOSIS — Z1231 Encounter for screening mammogram for malignant neoplasm of breast: Secondary | ICD-10-CM | POA: Diagnosis not present

## 2016-10-30 DIAGNOSIS — F4322 Adjustment disorder with anxiety: Secondary | ICD-10-CM | POA: Diagnosis not present

## 2016-11-04 DIAGNOSIS — F4322 Adjustment disorder with anxiety: Secondary | ICD-10-CM | POA: Diagnosis not present

## 2016-11-06 DIAGNOSIS — D729 Disorder of white blood cells, unspecified: Secondary | ICD-10-CM | POA: Diagnosis not present

## 2016-11-06 DIAGNOSIS — R5381 Other malaise: Secondary | ICD-10-CM | POA: Diagnosis not present

## 2016-11-06 DIAGNOSIS — Z77120 Contact with and (suspected) exposure to mold (toxic): Secondary | ICD-10-CM | POA: Diagnosis not present

## 2016-11-06 DIAGNOSIS — R5383 Other fatigue: Secondary | ICD-10-CM | POA: Diagnosis not present

## 2016-11-13 DIAGNOSIS — F4322 Adjustment disorder with anxiety: Secondary | ICD-10-CM | POA: Diagnosis not present

## 2016-11-20 DIAGNOSIS — F4322 Adjustment disorder with anxiety: Secondary | ICD-10-CM | POA: Diagnosis not present

## 2016-11-27 DIAGNOSIS — F4322 Adjustment disorder with anxiety: Secondary | ICD-10-CM | POA: Diagnosis not present

## 2016-12-05 ENCOUNTER — Telehealth: Payer: Self-pay | Admitting: Internal Medicine

## 2016-12-05 NOTE — Telephone Encounter (Signed)
Patient states her friend Megan [name] referred you to her for a PCP. Are you willing to take her on as a new patient. Please advise, Thank you.

## 2016-12-07 NOTE — Telephone Encounter (Signed)
Yes, I will see her 

## 2016-12-09 NOTE — Telephone Encounter (Signed)
Spoke with patient to inform. She was on a call and will call office back to set up an appointment.

## 2016-12-10 DIAGNOSIS — R0602 Shortness of breath: Secondary | ICD-10-CM | POA: Diagnosis not present

## 2016-12-10 DIAGNOSIS — R269 Unspecified abnormalities of gait and mobility: Secondary | ICD-10-CM | POA: Diagnosis not present

## 2016-12-10 DIAGNOSIS — G959 Disease of spinal cord, unspecified: Secondary | ICD-10-CM | POA: Diagnosis not present

## 2016-12-10 DIAGNOSIS — R200 Anesthesia of skin: Secondary | ICD-10-CM | POA: Diagnosis not present

## 2016-12-10 DIAGNOSIS — R9089 Other abnormal findings on diagnostic imaging of central nervous system: Secondary | ICD-10-CM | POA: Diagnosis not present

## 2016-12-10 DIAGNOSIS — R2689 Other abnormalities of gait and mobility: Secondary | ICD-10-CM | POA: Diagnosis not present

## 2016-12-10 DIAGNOSIS — R5383 Other fatigue: Secondary | ICD-10-CM | POA: Diagnosis not present

## 2016-12-10 DIAGNOSIS — R202 Paresthesia of skin: Secondary | ICD-10-CM | POA: Diagnosis not present

## 2016-12-11 DIAGNOSIS — F4322 Adjustment disorder with anxiety: Secondary | ICD-10-CM | POA: Diagnosis not present

## 2016-12-16 DIAGNOSIS — F4322 Adjustment disorder with anxiety: Secondary | ICD-10-CM | POA: Diagnosis not present

## 2016-12-17 ENCOUNTER — Encounter: Payer: Self-pay | Admitting: Internal Medicine

## 2016-12-17 ENCOUNTER — Ambulatory Visit (INDEPENDENT_AMBULATORY_CARE_PROVIDER_SITE_OTHER): Payer: BLUE CROSS/BLUE SHIELD | Admitting: Internal Medicine

## 2016-12-17 VITALS — BP 98/60 | HR 62 | Temp 98.0°F | Resp 16 | Ht 69.0 in | Wt 133.0 lb

## 2016-12-17 DIAGNOSIS — R072 Precordial pain: Secondary | ICD-10-CM

## 2016-12-17 DIAGNOSIS — I253 Aneurysm of heart: Secondary | ICD-10-CM | POA: Insufficient documentation

## 2016-12-17 NOTE — Progress Notes (Signed)
Subjective:  Patient ID: Megan [name], female    DOB: 06-25-1956  Age: 61 y.o. MRN: [id number]  CC: Chest Pain  NEW TO ME  HPI Megan [name] presents for a 6 month hx of CP and DOE while doing spinning classes, she has also had a few near-syncopal episodes. She has a complex neurological and psychological history. She is currently seeing 2 holistic physicians and is considering doing rectal and IV ozone for treatment of chronic Lyme, and concerns that she may have primary progressive multiple sclerosis. She is followed by a neurologist here in [place] and a neurologist at [facility]. She had a cardiac MRI done 3 years ago that showed a nonobstructive aneurysm in the right ventricle.  History Megan [name] has a past medical history of Asthma; Depression; Hypothyroidism; Lyme disease; MVP (mitral valve prolapse); Orthostatic hypotension; Osteoporosis; Peripheral neuropathy (06/09/2012); Plantar fasciitis; and Pneumothorax.   She has a past surgical history that includes Tonsillectomy (1962); Mouth surgery (01/2012); Colonoscopy with propofol (N/A, 02/22/2013); Root canal (02/27/2012); and Nasal sinus surgery (Right, 06/12/2014).   Her family history includes Angina in her mother; Breast cancer in her paternal grandmother; Colon cancer in her mother; Diabetes in her paternal grandmother; Heart failure in her father; Hyperlipidemia in her father; Hypertension in her father and mother; Kidney failure in her father; Lung cancer in her mother and paternal grandfather; Prostate cancer in her father.She reports that she has never smoked. She has never used smokeless tobacco. She reports that she does not drink alcohol or use drugs.  Outpatient Medications Prior to Visit  Medication Sig Dispense Refill  . 5-Hydroxytryptophan (5-HTP) 50 MG CAPS Take 50 mg by mouth daily.    . Alpha-Lipoic Acid 300 MG TABS Take 600 mg by mouth daily.    . Cholecalciferol (VITAMIN D3) 10000 units capsule  Take 10,000 Units by mouth daily.    . Coenzyme Q10 (CO Q 10) 100 MG CAPS Take 200 mg by mouth daily.    . Digestive Enzymes (DIGESTIVE ENZYME PO) Take 1 tablet by mouth daily.    [name] GLUTATHIONE PO Take 1 capsule by mouth daily.    . Menaquinone-7 (VITAMIN K2 PO) Take 1 tablet by mouth daily.    . Misc Natural Products (CHLORELLA) 500 MG CAPS Take 500 mg by mouth daily.    . Multiple Vitamin (MULTIVITAMIN WITH MINERALS) TABS Take 1 tablet by mouth daily.    . NON FORMULARY Take 2 tablets by mouth daily. "NT Energy Factor"    . NON FORMULARY Take 3 tablets by mouth daily. "Energy Multi Flex"    . NON FORMULARY Take 1 tablet by mouth daily. "GI Benefits" supplement    . Omega-3 Fatty Acids (FISH OIL) 1000 MG CAPS Take 1,000 mg by mouth daily.    . magnesium oxide (MAG-OX) 400 MG tablet Take 400 mg by mouth daily.     . cholestyramine (QUESTRAN) 4 g packet Take 0.25 packets by mouth daily.    . naproxen (NAPROSYN) 500 MG tablet Take 1 tablet (500 mg total) by mouth 2 (two) times daily. (Patient not taking: Reported on 08/27/2016) 14 tablet 1  . NATURE-THROID 32.5 MG tablet Take 32.5 mg by mouth daily.  3  . NON FORMULARY Take 1 tablet by mouth daily. "Biocidin"    . NON FORMULARY Take 1 each by mouth daily. "Medi Clay" detox clay    . nystatin (MYCOSTATIN) 500000 units TABS tablet Take 15,000 Units by mouth daily.  0  .  traMADol (ULTRAM) 50 MG tablet Take 1 tablet (50 mg total) by mouth every 6 (six) hours as needed. (Patient not taking: Reported on 08/27/2016) 20 tablet 0   No facility-administered medications prior to visit.     ROS Review of Systems  Constitutional: Positive for fatigue. Negative for diaphoresis and unexpected weight change.  HENT: Negative for sore throat and trouble swallowing.   Eyes: Negative.   Respiratory: Positive for shortness of breath. Negative for apnea, chest tightness, wheezing and stridor.   Cardiovascular: Positive for chest pain. Negative for palpitations and  leg swelling.  Gastrointestinal: Negative for abdominal pain, constipation, diarrhea, nausea and vomiting.  Endocrine: Negative.   Genitourinary: Negative.  Negative for difficulty urinating and urgency.  Musculoskeletal: Negative.  Negative for back pain, myalgias and neck pain.  Skin: Negative.   Allergic/Immunologic: Negative.   Neurological: Negative.  Negative for dizziness, weakness and headaches.  Hematological: Negative for adenopathy. Does not bruise/bleed easily.  Psychiatric/Behavioral: Negative.     Objective:  BP 98/60 (BP Location: Left Arm, Patient Position: Sitting, Cuff Size: Normal)   Pulse 62   Temp 98 F (36.7 C) (Oral)   Resp 16   Ht 5\' 9"  (1.753 m)   Wt 133 lb (60.3 kg)   SpO2 99%   BMI 19.64 kg/m   Physical Exam  Constitutional: She is oriented to person, place, and time. No distress.  HENT:  Mouth/Throat: Oropharynx is clear and moist. No oropharyngeal exudate.  Eyes: Conjunctivae are normal. Right eye exhibits no discharge. Left eye exhibits no discharge. No scleral icterus.  Neck: Normal range of motion. Neck supple. No JVD present. No tracheal deviation present. No thyromegaly present.  Cardiovascular: Normal rate, regular rhythm, normal heart sounds and intact distal pulses.  Exam reveals no gallop and no friction rub.   No murmur heard. Pulses:      Carotid pulses are 1+ on the right side, and 1+ on the left side.      Radial pulses are 1+ on the right side, and 1+ on the left side.       Femoral pulses are 1+ on the right side, and 1+ on the left side.      Popliteal pulses are 1+ on the right side, and 1+ on the left side.       Dorsalis pedis pulses are 1+ on the right side, and 1+ on the left side.       Posterior tibial pulses are 1+ on the right side, and 1+ on the left side.  EKG --  NSR, no LVH, no evidence of ischemia  No change from prior EKG  Pulmonary/Chest: Effort normal and breath sounds normal. No stridor. No respiratory  distress. She has no wheezes. She has no rales. She exhibits no tenderness.  Abdominal: Soft. Bowel sounds are normal. She exhibits no distension and no mass. There is no tenderness. There is no rebound and no guarding.  Musculoskeletal: Normal range of motion. She exhibits no edema, tenderness or deformity.  Lymphadenopathy:    She has no cervical adenopathy.  Neurological: She is oriented to person, place, and time.  Skin: Skin is warm and dry. No rash noted. She is not diaphoretic. No erythema. No pallor.  Vitals reviewed.     Assessment & Plan:   Megan [name] was seen today for chest pain.  Diagnoses and all orders for this visit:  Precordial chest pain- Her chest pain is not consistent with angina and her EKG is negative for any signs  of ischemia or prior MI, I've asked her to see cardiology to see if there is concern that the aneurysm in her right ventricle may be causing her discomfort and other symptoms. -     EKG 12-Lead -     Ambulatory referral to Cardiology  Aneurysm of right ventricle of heart- as above -     Ambulatory referral to Cardiology   I have discontinued Megan [name]'s magnesium oxide, NATURE-THROID, nystatin, cholestyramine, traMADol, and naproxen. I am also having her maintain her multivitamin with minerals, Chlorella, 5-HTP, Vitamin D3, Alpha-Lipoic Acid, Fish Oil, Co Q 10, NON FORMULARY, NON FORMULARY, Menaquinone-7 (VITAMIN K2 PO), Digestive Enzymes (DIGESTIVE ENZYME PO), NON FORMULARY, GLUTATHIONE PO, Levothyroxine-Liothyronine, ARMOUR THYROID, BIOTIN PO, Probiotic Product (PROBIOTIC DAILY PO), and MAGNESIUM CITRATE PO.  Meds ordered this encounter  Medications  . Thyroid (LEVOTHYROXINE-LIOTHYRONINE) 30 MG TABS    Sig: Take by mouth.  Francia Greaves THYROID 30 MG tablet    Sig: Take 30 mg by mouth daily.    Refill:  6  . BIOTIN PO    Sig: Take 20,000 mcg by mouth.  . Probiotic Product (PROBIOTIC DAILY PO)    Sig: Take 1 capsule by mouth. 1 capsule of 5 billion  colonies  . MAGNESIUM CITRATE PO    Sig: Take 400 mg by mouth.     Follow-up: No Follow-up on file.  Scarlette Calico, MD

## 2016-12-17 NOTE — Progress Notes (Signed)
Pre visit review using our clinic review tool, if applicable. No additional management support is needed unless otherwise documented below in the visit note. 

## 2016-12-18 DIAGNOSIS — H01021 Squamous blepharitis right upper eyelid: Secondary | ICD-10-CM | POA: Diagnosis not present

## 2016-12-18 DIAGNOSIS — H04123 Dry eye syndrome of bilateral lacrimal glands: Secondary | ICD-10-CM | POA: Diagnosis not present

## 2016-12-18 DIAGNOSIS — H40013 Open angle with borderline findings, low risk, bilateral: Secondary | ICD-10-CM | POA: Diagnosis not present

## 2016-12-18 DIAGNOSIS — H2513 Age-related nuclear cataract, bilateral: Secondary | ICD-10-CM | POA: Diagnosis not present

## 2016-12-22 ENCOUNTER — Encounter: Payer: Self-pay | Admitting: Internal Medicine

## 2016-12-25 ENCOUNTER — Encounter: Payer: Self-pay | Admitting: Student

## 2016-12-25 DIAGNOSIS — A6920 Lyme disease, unspecified: Secondary | ICD-10-CM | POA: Diagnosis not present

## 2016-12-25 DIAGNOSIS — D729 Disorder of white blood cells, unspecified: Secondary | ICD-10-CM | POA: Diagnosis not present

## 2016-12-25 DIAGNOSIS — Z77120 Contact with and (suspected) exposure to mold (toxic): Secondary | ICD-10-CM | POA: Diagnosis not present

## 2016-12-25 DIAGNOSIS — F4322 Adjustment disorder with anxiety: Secondary | ICD-10-CM | POA: Diagnosis not present

## 2016-12-25 DIAGNOSIS — R5381 Other malaise: Secondary | ICD-10-CM | POA: Diagnosis not present

## 2016-12-26 NOTE — Progress Notes (Signed)
-   Cardiology Office Note    Date:  12/29/2016   ID:  Megan [name], DOB 11-25-1955, MRN [id number]  PCP/ Referring Provider:  Janith Lima, MD  Cardiologist: Dr. Stanford Breed (2014)  Chief Complaint  Patient presents with  . Shortness of Breath    states having some but not at this moment   . Follow-up    states feeling dizzy sometimes   . Edema    both feet but states left is worse    History of Present Illness:    Megan [name] is a 61 y.o. female with past medical history of MVP, orthostatic hypotension, and septal aneurysm extending into the RVOT (by cardiac MRI in 08/2013) who presents to the office today for evaluation of dyspnea on exertion and chest pain at the referral of Dr. Ronnald Ramp.  She was last examined by Dr. Stanford Breed in 07/2013 for dyspnea on exertion. She denied any associated chest pain, palpitations, orthopnea, or edema. She did report a recent trip therefore a d-dimer was checked and negative for a PE. An echocardiogram was obtained and showed a preserved EF of 55-60%, no WMA, mild MVP, and possible sinus of valsalva aneurysm. A cardiac MRI was obtained for further evaluation and showed aneurysmal dilatation of the membranous ventricular septum extending into the RVOT with no apparent systolic flow or shunting and a normal sinus of valsalva 2.8 cm symmetrical in all 3 sinuses with no AR and normal trileaflet AV.  She was recently seen by her PCP on 12/17/2016 and reported having chest pain and dyspnea on exertion while participating in spin classes. An EKG was obtained and showed NSR, HR 64, with no acute ST or T-wave changes.  In talking with the patient today, she reports being followed by a variety of specialists secondary to numbness and tingling along her lower extremities with discoloration along her feet. She was told by neurology that she might possibly have MS but this is not definitive. Has been followed by neuropathic providers who believe her  symptoms are secondary to Lyme's disease.   Her numbness, tingling, and pain along her lower extremities comes and goes at rest and with activities. The discoloration is also variable. She has also noticed worsening dyspnea which can occur at rest or with activity. She participates in spin classes and notices a sensation along her chest which can occur while she is exercising and last for 5-10 minutes and gradually improves, even while she continues to exercise. Has also noted edema along her lower extremities which is worse towards the end of the day.    Past Medical History:  Diagnosis Date  . Asthma   . Depression   . Hypothyroidism   . Lyme disease   . MVP (mitral valve prolapse)   . Orthostatic hypotension   . Osteoporosis   . Peripheral neuropathy 06/09/2012  . Plantar fasciitis   . Pneumothorax     Past Surgical History:  Procedure Laterality Date  . COLONOSCOPY WITH PROPOFOL N/A 02/22/2013   Procedure: COLONOSCOPY WITH PROPOFOL;  Surgeon: Garlan Fair, MD;  Location: [facility];  Service: Endoscopy;  Laterality: N/A;  . MOUTH SURGERY  01/2012   bone graft in mouth  . NASAL SINUS SURGERY Right 06/12/2014   Procedure: RIGHT ENDOSCOPIC MAXILLARY ANTROSTOMY;  Surgeon: Ascencion Dike, MD;  Location: [facility];  Service: ENT;  Laterality: Right;  . ROOT CANAL  02/27/2012  . TONSILLECTOMY  1962    Current Medications: Outpatient Medications  Prior to Visit  Medication Sig Dispense Refill  . 5-Hydroxytryptophan (5-HTP) 50 MG CAPS Take 50 mg by mouth daily.    . Alpha-Lipoic Acid 300 MG TABS Take 600 mg by mouth daily.    Francia Greaves THYROID 30 MG tablet Take 30 mg by mouth daily.  6  . BIOTIN PO Take 20,000 mcg by mouth.    . Cholecalciferol (VITAMIN D3) 10000 units capsule Take 10,000 Units by mouth daily.    . Coenzyme Q10 (CO Q 10) 100 MG CAPS Take 200 mg by mouth daily.    . Digestive Enzymes (DIGESTIVE ENZYME PO) Take 1 tablet by mouth daily.    [name] GLUTATHIONE  PO Take 1 capsule by mouth daily.    [name] MAGNESIUM CITRATE PO Take 400 mg by mouth.    . Menaquinone-7 (VITAMIN K2 PO) Take 1 tablet by mouth daily.    . Misc Natural Products (CHLORELLA) 500 MG CAPS Take 500 mg by mouth daily.    . Multiple Vitamin (MULTIVITAMIN WITH MINERALS) TABS Take 1 tablet by mouth daily.    . NON FORMULARY Take 2 tablets by mouth daily. "NT Energy Factor"    . NON FORMULARY Take 3 tablets by mouth daily. "Energy Multi Flex"    . NON FORMULARY Take 1 tablet by mouth daily. "GI Benefits" supplement    . Omega-3 Fatty Acids (FISH OIL) 1000 MG CAPS Take 1,000 mg by mouth daily.    . Probiotic Product (PROBIOTIC DAILY PO) Take 1 capsule by mouth. 1 capsule of 5 billion colonies    . Thyroid (LEVOTHYROXINE-LIOTHYRONINE) 30 MG TABS Take by mouth.     No facility-administered medications prior to visit.      Allergies:   Patient has no known allergies.   Social History   Social History  . Marital status: Divorced    Spouse name: N/A  . Number of children: 0  . Years of education: N/A   Occupational History  .      Career and life coach   Social History Main Topics  . Smoking status: Never Smoker  . Smokeless tobacco: Never Used  . Alcohol use No  . Drug use: No  . Sexual activity: Not Asked   Other Topics Concern  . None   Social History Narrative   Patient is single and lives alone.   Patient is self-employed, career and life coaching.   Patient drinks two to four cups of caffeine daily.     Family History:  The patient's family history includes Angina in her mother; Breast cancer in her paternal grandmother; Colon cancer in her mother; Diabetes in her paternal grandmother; Heart failure in her father; Hyperlipidemia in her father; Hypertension in her father and mother; Kidney failure in her father; Lung cancer in her mother and paternal grandfather; Prostate cancer in her father.   Review of Systems:   Please see the history of present illness.      General:  No chills, fever, night sweats or weight changes.  Cardiovascular:  No chest pain, orthopnea, palpitations, paroxysmal nocturnal dyspnea. Positive for edema and dyspnea on exertion.  Dermatological: No rash, lesions/masses Respiratory: No cough, dyspnea Urologic: No hematuria, dysuria Abdominal:   No nausea, vomiting, diarrhea, bright red blood per rectum, melena, or hematemesis Neurologic:  No visual changes, wkns, changes in mental status. Positive for neuropathy.  All other systems reviewed and are otherwise negative except as noted above.   Physical Exam:    VS:  BP 96/61  Pulse 72   Ht 5' 9.5" (1.765 m)   Wt 131 lb 9.6 oz (59.7 kg)   SpO2 99%   BMI 19.16 kg/m    General: Well developed, well nourished Caucasian female appearing in no acute distress. Head: Normocephalic, atraumatic, sclera non-icteric, no xanthomas, nares are without discharge.  Neck: No carotid bruits. JVD not elevated.  Lungs: Respirations regular and unlabored, without wheezes or rales.  Heart: Regular rate and rhythm. No S3 or S4.  No murmur, no rubs, or gallops appreciated. Abdomen: Soft, non-tender, non-distended with normoactive bowel sounds. No hepatomegaly. No rebound/guarding. No obvious abdominal masses. Msk:  Strength and tone appear normal for age. No joint deformities or effusions. Extremities: No clubbing or cyanosis. Trace lower extremity edema.  Distal pedal pulses are 2+ bilaterally. Neuro: Alert and oriented X 3. Moves all extremities spontaneously. No focal deficits noted. Psych:  Responds to questions appropriately with a normal affect. Skin: No rashes or lesions noted  Wt Readings from Last 3 Encounters:  12/29/16 131 lb 9.6 oz (59.7 kg)  12/17/16 133 lb (60.3 kg)  08/27/16 133 lb (60.3 kg)     Studies/Labs Reviewed:   EKG:  EKG is not ordered today. EKG from 12/17/2016 is reviewed which shows NSR, HR 64, with no acute ST or T-wave changes.   Recent Labs: 08/08/2016:  ALT 18; BUN 10; Creatinine, Ser 0.70; Hemoglobin 14.6; Platelets 192; Potassium 4.4; Sodium 141   Lipid Panel    Component Value Date/Time   CHOL  07/19/2008 0625    160        ATP III CLASSIFICATION:  <200     mg/dL   Desirable  200-239  mg/dL   Borderline High  >=240    mg/dL   High   TRIG 61 07/19/2008 0625   HDL 37 (L) 07/19/2008 0625   CHOLHDL 4.3 07/19/2008 0625   VLDL 12 07/19/2008 0625   LDLCALC (H) 07/19/2008 0625    111        Total Cholesterol/HDL:CHD Risk Coronary Heart Disease Risk Table                     Men   Women  1/2 Average Risk   3.4   3.3    Additional studies/ records that were reviewed today include:   Echocardiogram: 07/2013 Study Conclusions  - Left ventricle: The cavity size was normal. Wall thickness was normal. Systolic function was normal. The estimated ejection fraction was in the range of 55% to 60%. Wall motion was normal; there were no regional wall motion abnormalities. Left ventricular diastolic function parameters were normal. - Mitral valve: Mild prolapse, involving the anterior leaflet and the posterior leaflet. - Atrial septum: There was an atrial septal aneurysm. Impressions:  - Possible sinus of valsalva aneurysm; suggest cardiac MRI to further assess.  Cardiac MRI: 08/2013 IMPRESSION 1) Aneurysmal dilatation of the membranous ventricular septum extending into the RVOT with no apparent systolic flow or shunting  2) Normal sinus of valsalva 2.8 cm symmetrical in all 3 sinuses with no AR and normal trileaflet AV  3) Normal RA/LA/RV and LV EF 57%  4) No hyperenhancement on delayed gadolinium images  5) Normal aortic MRA with no aneurysm or coarctation and normal origin of the great vessels   Assessment:    1. Edema, lower extremity   2. Claudication of both lower extremities ([place])   3. Dyspnea on exertion   4. Aneurysm of right ventricle of heart   5.  Neuropathy      Plan:   In order of  problems listed above:  1. Lower Extremity Edema/ Claudication - reports worsening symptoms over the past 5+ years. She is undergoing further workup for possible Lyme Disease and chronic mold exposure (being followed by providers at the [facility] for potential ozone therapy). Had ABI's in 2014 which showed a chronically thrombosed right great saphenous vein. Will repeat ABI's to assess for worsening stenosis.  - It is possible a component of her symptoms could be secondary to Raynaud's phenomenon as she reports color changes along her feet bilaterally, but BP limits the trial of a CCB (BP at 96/61 today) at this time.   2. Dyspnea on Exertion - she reports worsening dyspnea which can occur at rest or with activity. Can develop a sensation across her chest which is sometimes present with exercise but resolves with continued exercise, atypical for classic anginal symptoms. O2 saturations are appropriate today. Recent EKG was without acute ischemic changes. - will obtain a repeat echocardiogram to examine LV function, wall motion, and to assess for structural abnormalities.   3. Septal Aneurysm extending into the RVOT - echo in 2014 showed a preserved EF of 55-60%, no WMA, mild MVP, and possible sinus of valsalva aneurysm. A cardiac MRI was obtained and showed aneurysmal dilatation of the membranous ventricular septum extending into the RVOT with no apparent systolic flow or shunting and a normal sinus of valsalva 2.8 cm symmetrical in all 3 sinuses with no AR and normal trileaflet AV. - discussed with Dr. Sallyanne Kuster (DOD). Would not repeat cardiac MRI as this is not likely to have progressed in severity as it is a known congential defect. Will plan for echocardiogram as above.  4. Neuropathy - being followed by Neurology at [facility]    Medication Adjustments/Labs and Tests Ordered: Current medicines are reviewed at length with the patient today.  Concerns regarding medicines  are outlined above.  Medication changes, Labs and Tests ordered today are listed in the Patient Instructions below. Patient Instructions  Medication Instructions: No changes  Procedures/Testing: Your physician has requested that you have an echocardiogram. Echocardiography is a painless test that uses sound waves to create images of your heart. It provides your doctor with information about the size and shape of your heart and how well your heart's chambers and valves are working. This procedure takes approximately one hour. There are no restrictions for this procedure. This will be done at [place], [address].  Your physician has requested that you have an ankle brachial index (ABI). During this test an ultrasound and blood pressure cuff are used to evaluate the arteries that supply the arms and legs with blood. Allow thirty minutes for this exam. There are no restrictions or special instructions. This will be done at [place], [address]  Follow-Up: Please follow up with Dr. Stanford Breed after these tests.  If you need a refill on your cardiac medications before your next appointment, please call your pharmacy.    Signed, Erma Heritage, PA-C  12/29/2016 9:20 PM    [facility] [place], [place], [place]  [address] Phone: [phone]; Fax: [phone]  [address] [place], [place] [address] Phone: [phone]

## 2016-12-29 ENCOUNTER — Ambulatory Visit (INDEPENDENT_AMBULATORY_CARE_PROVIDER_SITE_OTHER): Payer: BLUE CROSS/BLUE SHIELD | Admitting: Student

## 2016-12-29 ENCOUNTER — Encounter: Payer: Self-pay | Admitting: Student

## 2016-12-29 VITALS — BP 96/61 | HR 72 | Ht 69.5 in | Wt 131.6 lb

## 2016-12-29 DIAGNOSIS — I253 Aneurysm of heart: Secondary | ICD-10-CM

## 2016-12-29 DIAGNOSIS — R0609 Other forms of dyspnea: Secondary | ICD-10-CM

## 2016-12-29 DIAGNOSIS — R0600 Dyspnea, unspecified: Secondary | ICD-10-CM

## 2016-12-29 DIAGNOSIS — R600 Localized edema: Secondary | ICD-10-CM | POA: Diagnosis not present

## 2016-12-29 DIAGNOSIS — G629 Polyneuropathy, unspecified: Secondary | ICD-10-CM

## 2016-12-29 DIAGNOSIS — I739 Peripheral vascular disease, unspecified: Secondary | ICD-10-CM | POA: Diagnosis not present

## 2016-12-29 NOTE — Patient Instructions (Signed)
Medication Instructions: No changes   Procedures/Testing: Your physician has requested that you have an echocardiogram. Echocardiography is a painless test that uses sound waves to create images of your heart. It provides your doctor with information about the size and shape of your heart and how well your heart's chambers and valves are working. This procedure takes approximately one hour. There are no restrictions for this procedure. This will be done at [place], [address].  Your physician has requested that you have an ankle brachial index (ABI). During this test an ultrasound and blood pressure cuff are used to evaluate the arteries that supply the arms and legs with blood. Allow thirty minutes for this exam. There are no restrictions or special instructions. This will be done at [place], [address]   Follow-Up: Please follow up with Dr. Stanford Breed after these test.   If you need a refill on your cardiac medications before your next appointment, please call your pharmacy.

## 2017-01-01 DIAGNOSIS — F4322 Adjustment disorder with anxiety: Secondary | ICD-10-CM | POA: Diagnosis not present

## 2017-01-06 ENCOUNTER — Other Ambulatory Visit: Payer: Self-pay

## 2017-01-06 ENCOUNTER — Encounter: Payer: Self-pay | Admitting: Oncology

## 2017-01-06 ENCOUNTER — Ambulatory Visit (INDEPENDENT_AMBULATORY_CARE_PROVIDER_SITE_OTHER): Payer: BLUE CROSS/BLUE SHIELD | Admitting: Oncology

## 2017-01-06 ENCOUNTER — Ambulatory Visit (HOSPITAL_COMMUNITY): Payer: BLUE CROSS/BLUE SHIELD | Attending: Cardiology

## 2017-01-06 VITALS — BP 97/64 | HR 64 | Temp 97.7°F | Ht 69.5 in | Wt 132.5 lb

## 2017-01-06 DIAGNOSIS — R0609 Other forms of dyspnea: Secondary | ICD-10-CM | POA: Diagnosis not present

## 2017-01-06 DIAGNOSIS — R5382 Chronic fatigue, unspecified: Secondary | ICD-10-CM | POA: Diagnosis not present

## 2017-01-06 DIAGNOSIS — D72818 Other decreased white blood cell count: Secondary | ICD-10-CM | POA: Diagnosis not present

## 2017-01-06 DIAGNOSIS — I340 Nonrheumatic mitral (valve) insufficiency: Secondary | ICD-10-CM | POA: Insufficient documentation

## 2017-01-06 DIAGNOSIS — R600 Localized edema: Secondary | ICD-10-CM | POA: Diagnosis not present

## 2017-01-06 DIAGNOSIS — R0600 Dyspnea, unspecified: Secondary | ICD-10-CM

## 2017-01-06 DIAGNOSIS — G608 Other hereditary and idiopathic neuropathies: Secondary | ICD-10-CM

## 2017-01-06 DIAGNOSIS — D72819 Decreased white blood cell count, unspecified: Secondary | ICD-10-CM

## 2017-01-06 LAB — CBC WITH DIFFERENTIAL/PLATELET
Basophils Absolute: 0 10*3/uL (ref 0.0–0.1)
Basophils Relative: 1 %
Eosinophils Absolute: 0.1 10*3/uL (ref 0.0–0.7)
Eosinophils Relative: 2 %
HCT: 41 % (ref 36.0–46.0)
Hemoglobin: 13.5 g/dL (ref 12.0–15.0)
Lymphocytes Relative: 32 %
Lymphs Abs: 1 10*3/uL (ref 0.7–4.0)
MCH: 29.3 pg (ref 26.0–34.0)
MCHC: 32.9 g/dL (ref 30.0–36.0)
MCV: 89.1 fL (ref 78.0–100.0)
Monocytes Absolute: 0.3 10*3/uL (ref 0.1–1.0)
Monocytes Relative: 9 %
Neutro Abs: 1.8 10*3/uL (ref 1.7–7.7)
Neutrophils Relative %: 56 %
Platelets: 188 10*3/uL (ref 150–400)
RBC: 4.6 MIL/uL (ref 3.87–5.11)
RDW: 12.4 % (ref 11.5–15.5)
WBC: 3.2 10*3/uL — ABNORMAL LOW (ref 4.0–10.5)

## 2017-01-06 LAB — ECHOCARDIOGRAM COMPLETE
Height: 69.5 in
Weight: 2120 oz

## 2017-01-06 LAB — SAVE SMEAR

## 2017-01-06 NOTE — Patient Instructions (Signed)
To lab Return visit 1 year  CBC,diff 1 week before visit

## 2017-01-06 NOTE — Progress Notes (Addendum)
Hematology and Oncology Follow Up Visit  Solace Manwarren [id number] Feb 05, 1956 61 y.o. 01/06/2017 12:15 PM   Principle Diagnosis: Encounter Diagnosis  Name Primary?  . Leukopenia, unspecified type Yes  Clinical Summary: 61 year old woman initially referred to me in October 2013 to evaluate chronic neutropenia and idiopathic distal neuropathy affecting her feet. An extensive evaluation was done including myeloma screen, screen for collagen vascular disorders, screen for bone marrow suppressive viral agents(CMV, EBV, HIV, hepatitis A, B, C) , toxic drug screen including lead levels, and was unrevealing. A single spot urine specimen was positive for the presence of monoclonal lambda immunoglobulin. However when I repeated a 24-hour urine study in June of 2015, there was no proteinuria and no monoclonal proteins. She tested negative 2 for the presence of cryoglobulins. Normal ceruloplasmin level. She had a decrease in total serum IgG level without any monoclonal proteins in the serum. Other than the chronic leukopenia with a normal white count differential, she has never developed anemia or thrombocytopenia. She has no adenopathy or organomegaly on exam. She has never developed any progressive constitutional symptoms. She has not had problems with frequent or severe infection. Neuropathy remains limited to her feet. She had partial relief of her symptoms on a Vegan diet. She  had a surgical procedure on her sinuses on June 12, 2014. Specimens were negative for bacteria, fungi, or AFB. She sought additional consultation with an alternative medicine provider. She had an exposure to black mold back in 2009 and dates most of her symptoms subsequent to that exposure. She tested positive for the presence of "mold" in her urine. She was started on amphotericin B. She only received 1 dose since it caused significant toxicity. She was put on vitamin C, niacin, multiple vitamins, and acidophilus and a  another herbal antifungal medication and does feel that her neuropathy has improved about 50%.  She has had chronic,persistent, fatigue and neuropathy. At time of her visit with me in June 2017 she had developed aching in her left eye. No obvious disease on exam by an ophthalmologist.  She brought me copies of most recent laboratory studies at time of that visit. White count as low as 2200 on 11/14/2015 but always with a normal differential: 57 neutrophils, 31 lymphs, 9 monocytes. Repeat CBC on Jan 15, 2016 with total white count 2500, 52 neutrophils, 35 lymphocytes, 10 monocytes. ESR 6 mm. Rheumatoid factor negative. She was tested for Lyme disease again on 11/14/2015. IgM titers negative. IgG Western blot negative but positive IgG for 3 out of proximally 10 serotypes tested. Positive IgG against mycoplasma but negative IgM. Flow cytometry Studies done on peripheral blood in December 2015 in a laboratory outside of our system report stated population of CD8 negative/CD57 positive lymphocytes 29/mcL with normal range 60-360. She was told that this meant that  her immune system was suppressed. I repeated flow cytometry on peripheral blood at time of her visit with me in June 2017. There were no monoclonal B or T cell populations.   Interim History:   She continues to seek advice from traditional and alternative medicine providers. She saw a doctor Mauro Kaufmann, a renowned alternative medicine provider in [place] affiliated with the [facility]. It was his opinion that she has chronic Lyme disease, an opinion not supported by our infectious disease experts. He recommended intravenous and rectal Ozone treatments. 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 She no longer can tolerate  significant exercise. Her muscles are weaker. Her distal neuropathy primarily affecting her feet is getting worse. Over the  last year she has been followed by  a neurologist, Dr. George Hugh, at [facility] in [place]. She had a normal MRI of the brain. She had a normal spinal tap on 07/25/2016 with CSF protein 30, clear fluid with no white cells no red cells, normal glucose. She did not have any results of mild and basic protein or other immune studies that might have been done on the specimen. She was told that she likely has multiple sclerosis although there is still no objective evidence for this. She was told to schedule another appointment with me since there was a trend for her lymphocytes to be increasing. (Which is not the case). She brought additional laboratory studies done over the last year: Hemoglobin and platelet count remain normal. White blood count has fluctuated with no significant trend for progressive decline and white count differential remains normal. On 06/17/2016: White count 3300, 58 neutrophils, 32 lymphocytes, 9 monocytes, 1 eosinophil, 1 basophil; on 10/09/2016: White count 2700, 58 neutrophils, 28 lymphocytes, 11 monocytes, 2 eosinophils, 1 basophil. On 12/10/2016 total white count 3100 with 63% neutrophils, 27 lymphocytes, 8 monocytes, 1 eosinophil, 1 basophil.  A repeat serum protein electrophoresis 12/10/2016 showed no monoclonal spike. Gammaglobulin in normal range 0.6 g percent range 0.5-1.6. Polyclonal pattern on immunofixation electrophoresis.  She denies any interim serious infections. She consistently has greater than 1000 neutrophils on the white count differentials despite leukopenia. She is having some atypical chest symptoms and is going to have a echocardiogram today.  Medications:   Current Outpatient Prescriptions:  .  5-Hydroxytryptophan (5-HTP) 50 MG CAPS, Take 50 mg by mouth daily., Disp: , Rfl:  .  Alpha-Lipoic Acid 300 MG TABS, Take 600 mg by mouth daily., Disp: , Rfl:  .  ARMOUR THYROID 30 MG tablet, Take 30 mg by mouth daily.,  Disp: , Rfl: 6 .  BIOTIN PO, Take 20,000 mcg by mouth., Disp: , Rfl:  .  Cholecalciferol (VITAMIN D3) 10000 units capsule, Take 10,000 Units by mouth daily., Disp: , Rfl:  .  Coenzyme Q10 (CO Q 10) 100 MG CAPS, Take 200 mg by mouth daily., Disp: , Rfl:  .  Digestive Enzymes (DIGESTIVE ENZYME PO), Take 1 tablet by mouth daily., Disp: , Rfl:  .  GLUTATHIONE PO, Take 1 capsule by mouth daily., Disp: , Rfl:  .  MAGNESIUM CITRATE PO, Take 400 mg by mouth., Disp: , Rfl:  .  Menaquinone-7 (VITAMIN K2 PO), Take 1 tablet by mouth daily., Disp: , Rfl:  .  Misc Natural Products (CHLORELLA) 500 MG CAPS, Take 500 mg by mouth daily., Disp: , Rfl:  .  Multiple Vitamin (MULTIVITAMIN WITH MINERALS) TABS, Take 1 tablet by mouth daily., Disp: , Rfl:  .  NON FORMULARY, Take 2 tablets by mouth daily. "NT Energy Factor", Disp: , Rfl:  .  NON FORMULARY, Take 3 tablets by mouth daily. "Energy Multi Flex", Disp: , Rfl:  .  NON FORMULARY, Take 1 tablet by mouth daily. "GI Benefits" supplement, Disp: , Rfl:  .  Omega-3 Fatty Acids (FISH OIL) 1000 MG CAPS, Take 1,000 mg by mouth daily., Disp: , Rfl:  .  Probiotic Product (PROBIOTIC DAILY PO), Take 1 capsule by mouth. 1 capsule of 5 billion colonies, Disp: , Rfl:   Allergies: No Known Allergies  Review of Systems: See interim history Remaining ROS negative:  Physical Exam: Blood pressure 97/64, pulse 64, temperature 97.7 F (36.5 C), temperature source Oral, height 5' 9.5" (1.765 m), weight 132 lb 8 oz (60.1 kg), SpO2 100 %. Wt Readings from Last 3 Encounters:  01/06/17 132 lb 8 oz (60.1 kg)  12/29/16 131 lb 9.6 oz (59.7 kg)  12/17/16 133 lb (60.3 kg)     General appearance: Thin, tall, well-nourished, Caucasian woman. Weight unchanged from last year. HENNT: Pharynx no erythema, exudate, mass, or ulcer. No thyromegaly or thyroid nodules Lymph nodes: No cervical, supraclavicular, or axillary lymphadenopathy Breasts:  Lungs: Clear to auscultation, resonant to  percussion throughout Heart: Regular rhythm, no murmur, no gallop, no rub, no click, no edema Abdomen: Soft, nontender, normal bowel sounds, no mass, no organomegaly Extremities: No edema, no calf tenderness Musculoskeletal: no joint deformities GU:  Vascular: Carotid pulses 2+, no bruits,  Neurologic: Alert, oriented, PERRLA, optic discs not well visualized, vessels normal, no hemorrhage or exudate, cranial nerves grossly normal, motor strength 5 over 5 upper and lower extremities, reflexes 1+ symmetric, upper body coordination normal, gait normal, there is no muscle wasting or atrophy. There is minimal decrease in vibration sensation over the fingertips bilaterally by tuning fork exam. I did not test her feet. Skin: No rash or ecchymosis  Lab Results: CBC W/Diff    Component Value Date/Time   WBC 3.2 (L) 01/06/2017 1125   RBC 4.60 01/06/2017 1125   HGB 13.5 01/06/2017 1125   HGB 13.7 03/15/2013 1144   HCT 41.0 01/06/2017 1125   HCT 40.8 03/15/2013 1144   PLT 188 01/06/2017 1125   PLT 210 03/15/2013 1144   MCV 89.1 01/06/2017 1125   MCV 88.9 03/15/2013 1144   MCH 29.3 01/06/2017 1125   MCHC 32.9 01/06/2017 1125   RDW 12.4 01/06/2017 1125   RDW 13.8 03/15/2013 1144   LYMPHSABS 1.0 01/06/2017 1125   LYMPHSABS 1.0 03/15/2013 1144   MONOABS 0.3 01/06/2017 1125   MONOABS 0.3 03/15/2013 1144   EOSABS 0.1 01/06/2017 1125   EOSABS 0.0 03/15/2013 1144   BASOSABS 0.0 01/06/2017 1125   BASOSABS 0.1 03/15/2013 1144     Chemistry      Component Value Date/Time   NA 141 08/08/2016 0840   NA 140 05/24/2012 1053   K 4.4 08/08/2016 0840   K 4.1 05/24/2012 1053   CL 105 08/08/2016 0840   CL 106 05/24/2012 1053   CO2 29 08/08/2016 0840   CO2 27 05/24/2012 1053   BUN 10 08/08/2016 0840   BUN 13.0 05/24/2012 1053   CREATININE 0.70 08/08/2016 0840   CREATININE 0.63 03/05/2016 1159   CREATININE 0.7 05/24/2012 1053      Component Value Date/Time   CALCIUM 9.8 08/08/2016 0840    CALCIUM 9.5 05/24/2012 1053   ALKPHOS 83 08/08/2016 0840   ALKPHOS 65 05/24/2012 1053   AST 36 08/08/2016 0840   AST 29 05/24/2012 1053   ALT 18 08/08/2016 0840   ALT 13 05/24/2012 1053   BILITOT 0.8 08/08/2016 0840   BILITOT 0.50 05/24/2012 1053     Review of the peripheral blood film: Normochromic normocytic red cells.  No spherocytes, schistocytes, or polychromasia.  No red cell inclusions.  Overall leukopenia.  Neutrophils appear normal in granulation and lobation.  Mature monocytes.  Occasional benign reactive lymphocytes with prominent granules.  Platelets appear normal in number and morphology.   Radiological Studies: No results found.  Impression:  #1. Chronic, idiopathic, leukopenia with normal white count differential, no anemia, no thrombocytopenia.  No lymphadenopathy. No organomegaly. No elevated monoclonal proteins. Negative cryoglobulins. No recurrent infections. White count has fluctuated over time, now followed for 9 years with no significant trend for progressive deterioration. Flow cytometry on peripheral blood reveals no monoclonal B or T cell populations. Leukopenia developed subsequent to a pneumonia back in 2009. It is quite possible, though difficult to prove, that she has chronic suppression limited to the white cell line related to a previous viral infection. There is no evidence for malignant hematologic process. If she had a lymphoma, amyloidosis, or myeloma, it would have become manifest by this time. I still feel that a bone marrow biopsy will be extremely low yield with respect to any findings that might provide any insight on her leukopenia or idiopathic distal neuropathy and chronic fatigue syndrome.  #2. Chronic idiopathic distal neuropathy and chronic fatigue syndrome. Extensive evaluations to date have failed to reveal a definitive diagnosis. She has seen in multiple traditional and alternative medicine providers. I strongly discouraged her from doing  alternative medicine treatments such as rectal ozone therapy in the absence of a definitive diagnosis. I encouraged her to remain critical about various treatments that may be offered to her at great expense and potential toxicity in absence of a specific diagnosis. She is considering a consultation at the [facility]. I encouraged her to do this. I told her they will likely do a bone marrow biopsy just for the sake of doing one even though the clinical yield will be low.  CC: Patient Care Team: Janith Lima, MD as PCP - General (Internal Medicine) Annia Belt, MD as Consulting Physician (Oncology) Alda Berthold, DO as Consulting Physician (Neurology) Arnetha Gula, MD as Consulting Physician (Family Medicine)   Murriel Hopper, MD, [place]  Hematology-Oncology/Internal Medicine     5/15/201812:15 PM

## 2017-01-08 ENCOUNTER — Other Ambulatory Visit: Payer: Self-pay | Admitting: Student

## 2017-01-08 DIAGNOSIS — F4322 Adjustment disorder with anxiety: Secondary | ICD-10-CM | POA: Diagnosis not present

## 2017-01-08 DIAGNOSIS — I739 Peripheral vascular disease, unspecified: Secondary | ICD-10-CM

## 2017-01-13 DIAGNOSIS — F4322 Adjustment disorder with anxiety: Secondary | ICD-10-CM | POA: Diagnosis not present

## 2017-01-16 ENCOUNTER — Ambulatory Visit (HOSPITAL_COMMUNITY)
Admission: RE | Admit: 2017-01-16 | Discharge: 2017-01-16 | Disposition: A | Discharge status: Home / Self Care | Payer: BLUE CROSS/BLUE SHIELD | Source: Ambulatory Visit | Attending: Cardiovascular Disease | Admitting: Cardiovascular Disease

## 2017-01-16 DIAGNOSIS — I739 Peripheral vascular disease, unspecified: Secondary | ICD-10-CM | POA: Diagnosis not present

## 2017-01-16 DIAGNOSIS — R209 Unspecified disturbances of skin sensation: Secondary | ICD-10-CM | POA: Diagnosis not present

## 2017-01-21 DIAGNOSIS — R0609 Other forms of dyspnea: Secondary | ICD-10-CM | POA: Diagnosis not present

## 2017-01-21 DIAGNOSIS — R930 Abnormal findings on diagnostic imaging of skull and head, not elsewhere classified: Secondary | ICD-10-CM | POA: Diagnosis not present

## 2017-01-21 DIAGNOSIS — G939 Disorder of brain, unspecified: Secondary | ICD-10-CM | POA: Diagnosis not present

## 2017-01-21 DIAGNOSIS — G959 Disease of spinal cord, unspecified: Secondary | ICD-10-CM | POA: Diagnosis not present

## 2017-01-21 NOTE — Progress Notes (Signed)
Cardiology Office Note    Date:  01/22/2017   ID:  Megan [name], Megan [name], Megan [name], Megan [name]  PCP:  Janith Lima, MD  Cardiologist: Dr. Stanford Breed  Chief Complaint  Patient presents with  . Follow-up    After testing  . Headache    Standing and bending.  . Edema    Feet.    History of Present Illness:    Megan [name] is a 61 y.o. female with past medical history of MVP, orthostatic hypotension, and septal aneurysm extending into the RVOT (by cardiac MRI in 08/2013) who presents to the office today for follow-up of her recent testing.  She was evaluated by myself on 12/29/2016 and reported a variety of symptoms including numbness and tingling along her lower extremities and was being followed by a variety of specialists for this in trying to determine the etiology of her symptoms. She also reported dyspnea at rest and with exertion which occurred mostly in spin classes. A repeat echocardiogram was obtained which showed a preserved EF of 60-65% with no regional WMA. A right sinus of valsalva aneurysm was noted which was consistent with her known prior septal aneurysm. Lower extremity dopplers were obtained to assess for PAD with studies showing normal flow bilaterally with no evidence of lower extremity arterial disease.   In talking with the patient today, she reports continued numbness and tingling along her hands and lower extremities. This has not improved with the recent changes in weather. She also notes associated headaches and dizziness which occurs at random times. Also noticed dyspnea on exertion which is unchanged from her prior examination. She denies any associated chest pain with this. Is able to perform yoga multiple times per week without any acute dyspnea. Does experience dizziness when changing positions during her exercises.  She has followed up with her Neurologist at [facility] and had a Chest CT and Brain MRI performed yesterday, of which she is  anxiously awaiting the results of. Her Chest CT is available for review in Care Everywhere and showed no acute abnormalities. She reports being told to see a Rheumatologist in the past but was unable to do so at the time secondary to insurance issues.   Past Medical History:  Diagnosis Date  . Aneurysm of cardiac wall, congenital    a.  septal aneurysm extending into the RVOT (by cardiac MRI in 08/2013)  . Asthma   . Depression   . Hypothyroidism   . Lyme disease   . MVP (mitral valve prolapse)   . Orthostatic hypotension   . Osteoporosis   . Peripheral neuropathy 06/09/2012  . Plantar fasciitis   . Pneumothorax     Past Surgical History:  Procedure Laterality Date  . COLONOSCOPY WITH PROPOFOL N/A 02/22/2013   Procedure: COLONOSCOPY WITH PROPOFOL;  Surgeon: Garlan Fair, MD;  Location: [facility];  Service: Endoscopy;  Laterality: N/A;  . MOUTH SURGERY  01/2012   bone graft in mouth  . NASAL SINUS SURGERY Right 06/12/2014   Procedure: RIGHT ENDOSCOPIC MAXILLARY ANTROSTOMY;  Surgeon: Ascencion Dike, MD;  Location: [place];  Service: ENT;  Laterality: Right;  . ROOT CANAL  [name]/12/2011  . TONSILLECTOMY  1963    Current Medications: Outpatient Medications Prior to Visit  Medication Sig Dispense Refill  . 5-Hydroxytryptophan (5-HTP) 50 MG CAPS Take 50 mg by mouth daily.    . Alpha-Lipoic Acid 300 MG TABS Take 600 mg by mouth daily.    Francia Greaves  THYROID 30 MG tablet Take 30 mg by mouth daily.  6  . BIOTIN PO Take 20,000 mcg by mouth.    . Cholecalciferol (VITAMIN D3) 10000 units capsule Take 10,000 Units by mouth daily.    . Coenzyme Q10 (CO Q 10) 100 MG CAPS Take 200 mg by mouth daily.    . Digestive Enzymes (DIGESTIVE ENZYME PO) Take 1 tablet by mouth daily.    [name] GLUTATHIONE PO Take 1 capsule by mouth daily.    [name] MAGNESIUM CITRATE PO Take 400 mg by mouth.    . Menaquinone-7 (VITAMIN K2 PO) Take 1 tablet by mouth daily.    . Misc Natural Products (CHLORELLA) 500  MG CAPS Take 500 mg by mouth daily.    . Multiple Vitamin (MULTIVITAMIN WITH MINERALS) TABS Take 1 tablet by mouth daily.    . NON FORMULARY Take 2 tablets by mouth daily. "NT Energy Factor"    . NON FORMULARY Take 3 tablets by mouth daily. "Energy Multi Flex"    . NON FORMULARY Take 1 tablet by mouth daily. "GI Benefits" supplement    . Omega-3 Fatty Acids (FISH OIL) 1000 MG CAPS Take 1,000 mg by mouth daily.    . Probiotic Product (PROBIOTIC DAILY PO) Take 1 capsule by mouth. 1 capsule of 5 billion colonies     No facility-administered medications prior to visit.      Allergies:   Patient has no known allergies.   Social History   Social History  . Marital status: Divorced    Spouse name: N/A  . Number of children: 0  . Years of education: N/A   Occupational History  .      Career and life coach   Social History Main Topics  . Smoking status: Never Smoker  . Smokeless tobacco: Never Used  . Alcohol use No  . Drug use: No  . Sexual activity: Not Asked   Other Topics Concern  . None   Social History Narrative   Patient is single and lives alone.   Patient is self-employed, career and life coaching.   Patient drinks two to four cups of caffeine daily.     Family History:  The patient's family history includes Angina in her mother; Breast cancer in her paternal grandmother; Colon cancer in her mother; Diabetes in her paternal grandmother; Heart failure in her father; Hyperlipidemia in her father; Hypertension in her father and mother; Kidney failure in her father; Lung cancer in her mother and paternal grandfather; Prostate cancer in her father.   Review of Systems:   Please see the history of present illness.     General:  No chills, fever, night sweats or weight changes.  Cardiovascular:  No chest pain, edema, orthopnea, palpitations, paroxysmal nocturnal dyspnea. Positive for dyspnea on exertion.  Dermatological: No rash, lesions/masses Respiratory: No cough,  dyspnea Urologic: No hematuria, dysuria Abdominal:   No nausea, vomiting, diarrhea, bright red blood per rectum, melena, or hematemesis Neurologic:  No visual changes, wkns, changes in mental status. Positive for joint pain and paraesthesias.  All other systems reviewed and are otherwise negative except as noted above.   Physical Exam:    VS:  BP (!) 88/60   Pulse 72   Ht 5\' 9"  (1.753 m)   Wt 132 lb (59.9 kg)   BMI 19.49 kg/m    General: Well developed, well nourished Caucasian female appearing in no acute distress. Head: Normocephalic, atraumatic, sclera non-icteric, no xanthomas, nares are without discharge.  Neck:  No carotid bruits. JVD not elevated.  Lungs: Respirations regular and unlabored, without wheezes or rales.  Heart: Regular rate and rhythm. No S3 or S4.  No murmur, no rubs, or gallops appreciated. Abdomen: Soft, non-tender, non-distended with normoactive bowel sounds. No hepatomegaly. No rebound/guarding. No obvious abdominal masses. Msk:  Strength and tone appear normal for age. No joint deformities or effusions. Extremities: No clubbing or cyanosis. No lower extremity edema.  Distal pedal pulses are 2+ bilaterally. Varicose veins noted.  Neuro: Alert and oriented X 3. Moves all extremities spontaneously. No focal deficits noted. Psych:  Responds to questions appropriately with a normal affect. Skin: No rashes or lesions noted  Wt Readings from Last 3 Encounters:  01/22/17 132 lb (59.9 kg)  01/06/17 132 lb 8 oz (60.1 kg)  05/[name]/18 131 lb 9.6 oz (59.7 kg)    Studies/Labs Reviewed:   EKG:  EKG is not ordered today.    Recent Labs: 08/08/2016: ALT 18; BUN 10; Creatinine, Ser 0.70; Potassium 4.4; Sodium 141 01/06/2017: Hemoglobin 13.5; Platelets 188   Lipid Panel    Component Value Date/Time   CHOL  07/19/2008 0625    160        ATP III CLASSIFICATION:  <200     mg/dL   Desirable  200-239  mg/dL   Borderline High  >=240    mg/dL   High   TRIG 61 07/19/2008  0625   HDL 37 (L) 07/19/2008 0625   CHOLHDL 4.3 07/19/2008 0625   VLDL 12 07/19/2008 0625   LDLCALC (H) 07/19/2008 0625    111        Total Cholesterol/HDL:CHD Risk Coronary Heart Disease Risk Table                     Men   Women  1/2 Average Risk   3.4   3.3    Additional studies/ records that were reviewed today include:   Echocardiogram: 01/06/2017 Study Conclusions  - Left ventricle: The cavity size was normal. Wall thickness was   normal. Systolic function was normal. The estimated ejection   fraction was in the range of 60% to 65%. Wall motion was normal;   there were no regional wall motion abnormalities. Left   ventricular diastolic function parameters were normal. - Mitral valve: There was mild regurgitation.  Impressions:  - Normal LV systolic and diastolic function; probable right sinus   of valsalva aneurysm (suggest CTA of thoracic aorta to further   assess); mild MR.  Lower Extremity Dopplers: 01/16/2017   Assessment:    1. Aneurysm of right ventricle of heart   2. Arthralgia of both hands   3. Neuropathy   4. Dyspnea on exertion   5. Idiopathic hypotension      Plan:   In order of problems listed above:  1. Pain along upper and lower extremities/ Neuropathy/ Paresthesias - these symptoms have continued to progress for the patient over the past several years.  - she is being followed by Neurology at [facility], Hem/Onc with [facility], and by multiple naturopathic providers with no definitive diagnosis thus far. - due to her lower extremity pain, dopplers were obtained to assess for PAD with her studies showing normal flow bilaterally with no evidence of lower extremity arterial disease.  - I do not believe her symptoms are cardiac related. She underwent a CT of the Chest and Brain MRI yesterday and is awaiting the results of those studies to rule out MS.  - with her  multiple issues of joint pain bilaterally, headaches, and generalized fatigue, she  questions if her symptoms could possibly be secondary to a Rheumatological disorder. She was scheduled to see a Rheumatologist in the past but her referral was never followed-up on. Will place a referral for Rheumatology today as a cause for her symptoms has not been identified and she is considering options such as rectal ozone for prior mold exposure. We discussed a more conventional workup and how it would be best to have a definitive diagnosis before undergoing experimental treatments.   2. Dyspnea on Exertion - This is unchanged from her prior examinations. She is able to perform yoga multiple times per week without the symptoms but notices acute dyspnea at other times. No associated chest pain and recent EKG showed no acute abnormalities.  - recent echo showed a preserved EF of 60-65% with no WMA. Known valsalva aneurysm was noted. Recent CT of chest was reviewed with the patient and without acute abnormalities. She is being followed by Neurology at [facility] for possible MS.   3. Known Septal Aneurysm - cardiac MRI in 08/2013 showed a septal aneurysm extending into the RVOT. - recent echo showed a sinus of valsalva which is consistent with her known septal aneurysm. Have discussed with the DOD in the past that further imaging is not indicated at this time as this is a known congenital defect.   4. Hypotension - BP 88/60 during initial check. At 102/64 on recheck. - discussed liberalizing her salt intake and wearing compression stockings as methods to assist with her BP.    Medication Adjustments/Labs and Tests Ordered: Current medicines are reviewed at length with the patient today.  Concerns regarding medicines are outlined above.  Medication changes, Labs and Tests ordered today are listed in the Patient Instructions below. Patient Instructions  Medication Instructions:  Your physician recommends that you continue on your current medications as directed. Please refer to the Current  Medication list given to you today.  If you need a refill on your cardiac medications before your next appointment, please call your pharmacy.  Follow-Up: Your physician wants you to follow-up in: 12 months WITH DR CRENSHAW. You will receive a reminder letter in the mail two months in advance. If you don't receive a letter, please call our office April 2019 to schedule the June 2019 follow-up appointment.  Special Instructions: REFERRAL TO RHEUMATOLOGY    Signed, Erma Heritage, PA-C  01/22/2017 7:18 PM    [facility] [place], [place], [place]  [address] Phone: [phone]; Fax: [phone]  [address] [place], [place] [address] Phone: [phone]

## 2017-01-22 ENCOUNTER — Encounter: Payer: Self-pay | Admitting: Student

## 2017-01-22 ENCOUNTER — Ambulatory Visit (INDEPENDENT_AMBULATORY_CARE_PROVIDER_SITE_OTHER): Payer: BLUE CROSS/BLUE SHIELD | Admitting: Student

## 2017-01-22 VITALS — BP 88/60 | HR 72 | Ht 69.0 in | Wt 132.0 lb

## 2017-01-22 DIAGNOSIS — G629 Polyneuropathy, unspecified: Secondary | ICD-10-CM

## 2017-01-22 DIAGNOSIS — I950 Idiopathic hypotension: Secondary | ICD-10-CM

## 2017-01-22 DIAGNOSIS — I253 Aneurysm of heart: Secondary | ICD-10-CM

## 2017-01-22 DIAGNOSIS — M25542 Pain in joints of left hand: Secondary | ICD-10-CM

## 2017-01-22 DIAGNOSIS — M25541 Pain in joints of right hand: Secondary | ICD-10-CM | POA: Diagnosis not present

## 2017-01-22 DIAGNOSIS — R0600 Dyspnea, unspecified: Secondary | ICD-10-CM

## 2017-01-22 DIAGNOSIS — F4322 Adjustment disorder with anxiety: Secondary | ICD-10-CM | POA: Diagnosis not present

## 2017-01-22 DIAGNOSIS — R0609 Other forms of dyspnea: Secondary | ICD-10-CM | POA: Diagnosis not present

## 2017-01-22 NOTE — Patient Instructions (Signed)
Medication Instructions:  Your physician recommends that you continue on your current medications as directed. Please refer to the Current Medication list given to you today.  If you need a refill on your cardiac medications before your next appointment, please call your pharmacy.  Follow-Up: Your physician wants you to follow-up in: 12 months WITH DR CRENSHAW. You will receive a reminder letter in the mail two months in advance. If you don't receive a letter, please call our office April 2019 to schedule the June 2019 follow-up appointment.   Special Instructions: REFERRAL TO RHEUMATOLOGY   Thank you for choosing [facility] at [facility]!!

## 2017-01-23 ENCOUNTER — Telehealth: Payer: Self-pay | Admitting: Student

## 2017-01-23 NOTE — Telephone Encounter (Signed)
Faxed referral to [facility] rhematology.

## 2017-01-28 DIAGNOSIS — L089 Local infection of the skin and subcutaneous tissue, unspecified: Secondary | ICD-10-CM | POA: Diagnosis not present

## 2017-02-17 DIAGNOSIS — D729 Disorder of white blood cells, unspecified: Secondary | ICD-10-CM | POA: Diagnosis not present

## 2017-02-17 DIAGNOSIS — B370 Candidal stomatitis: Secondary | ICD-10-CM | POA: Diagnosis not present

## 2017-02-17 DIAGNOSIS — R5383 Other fatigue: Secondary | ICD-10-CM | POA: Diagnosis not present

## 2017-02-17 DIAGNOSIS — R5381 Other malaise: Secondary | ICD-10-CM | POA: Diagnosis not present

## 2017-02-18 ENCOUNTER — Ambulatory Visit (INDEPENDENT_AMBULATORY_CARE_PROVIDER_SITE_OTHER): Payer: BLUE CROSS/BLUE SHIELD | Admitting: Podiatry

## 2017-02-18 ENCOUNTER — Encounter: Payer: Self-pay | Admitting: Podiatry

## 2017-02-18 VITALS — BP 88/56 | HR 59 | Resp 18

## 2017-02-18 DIAGNOSIS — E672 Megavitamin-B6 syndrome: Secondary | ICD-10-CM | POA: Diagnosis not present

## 2017-02-18 DIAGNOSIS — L600 Ingrowing nail: Secondary | ICD-10-CM

## 2017-02-18 DIAGNOSIS — F4322 Adjustment disorder with anxiety: Secondary | ICD-10-CM | POA: Diagnosis not present

## 2017-02-18 DIAGNOSIS — D729 Disorder of white blood cells, unspecified: Secondary | ICD-10-CM | POA: Diagnosis not present

## 2017-02-18 NOTE — Patient Instructions (Signed)
Today we discussed treatment options for an ingrown toenail. Because of your complex medical history and abnormal white blood cell count I am deferring and surgical treatment at this time. I did trim the margin of the nail without any bleeding and apply the antibiotic ointment. Follow-up could include periodic debridement. If surgery was considered would need to get medical clearance from your oncologist. Okay to apply Vaseline and a Band-Aid to the right great toe nail groove to soften the callus Return as needed

## 2017-02-18 NOTE — Progress Notes (Signed)
   Subjective:    Patient ID: Megan [name], female    DOB: 1956/01/06, 61 y.o.   MRN: [id number]  HPI This patient presents today complaining of approximately 9 month history of discomfort around the medial margin of the right hallux toenail. Patient describes self treatment including trimming and smoothing with an emery board which provides temporary relief. At time of presentation patient states that the medial margin is only mildly uncomfortable  Patient has complex medical history with possible MS diagnosis? Patient has a history of blood dyscrasia Patient relates a history of difficulty healing small cuts Patient denies history of diabetes Patient denies smoking history   Review of Systems  Constitutional: Positive for fatigue.  Cardiovascular: Positive for leg swelling.  Gastrointestinal: Positive for constipation.  Musculoskeletal:       Muscle pain   Neurological: Positive for weakness, light-headedness and numbness.  All other systems reviewed and are negative.      Objective:   Physical Exam  Orientated 3  Vascular: DP and PT pulses 2/4 bilaterally Capillary reflex immediate bilaterally  Neurological: Sensation to 10 g monofilament wire intact 9/9 bilaterally Vibratory sensation nonreactive bilaterally Ankle reflexes reactive bilaterally  Dermatological: No open skin lesions bilaterally The medial margin the right hallux toenail is mildly incurvated with a callused nail groove and low-grade erythema. There is no drainage or warmth  Musculoskeletal: Manual motor testing dorsi flexion, plantar flexion, inversion, eversion 5/5 bilaterally      Assessment & Plan:   Assessment: Ingrowing medial margin right hallux toenail with associated callused medial nail groove Complex medical history including difficulty healing  Plan: At this time I had a discussion with patient about treatment options. Because patient has significant complex medical history I  would not recommend phenol matricectomy and less medical clearance was provided. I recommended debridement of the nail to reduce symptoms. Patient agrees to treatment The medial margin of the right hallux toenail was debrided without any bleeding. An antibiotic dressing applied Patient instructed by Vaseline to the area with a Band-Aid to self with a callused nail groove Return as needed

## 2017-02-24 DIAGNOSIS — F4322 Adjustment disorder with anxiety: Secondary | ICD-10-CM | POA: Diagnosis not present

## 2017-03-05 DIAGNOSIS — F4322 Adjustment disorder with anxiety: Secondary | ICD-10-CM | POA: Diagnosis not present

## 2017-03-11 DIAGNOSIS — F4322 Adjustment disorder with anxiety: Secondary | ICD-10-CM | POA: Diagnosis not present

## 2017-03-19 DIAGNOSIS — F4322 Adjustment disorder with anxiety: Secondary | ICD-10-CM | POA: Diagnosis not present

## 2017-03-26 DIAGNOSIS — F4322 Adjustment disorder with anxiety: Secondary | ICD-10-CM | POA: Diagnosis not present

## 2017-03-26 DIAGNOSIS — R531 Weakness: Secondary | ICD-10-CM | POA: Diagnosis not present

## 2017-03-27 DIAGNOSIS — G609 Hereditary and idiopathic neuropathy, unspecified: Secondary | ICD-10-CM | POA: Diagnosis not present

## 2017-03-31 ENCOUNTER — Ambulatory Visit (INDEPENDENT_AMBULATORY_CARE_PROVIDER_SITE_OTHER): Payer: BLUE CROSS/BLUE SHIELD | Admitting: Podiatry

## 2017-03-31 ENCOUNTER — Encounter: Payer: Self-pay | Admitting: Podiatry

## 2017-03-31 ENCOUNTER — Ambulatory Visit (INDEPENDENT_AMBULATORY_CARE_PROVIDER_SITE_OTHER): Payer: BLUE CROSS/BLUE SHIELD

## 2017-03-31 VITALS — BP 122/72 | HR 62

## 2017-03-31 DIAGNOSIS — M79675 Pain in left toe(s): Secondary | ICD-10-CM | POA: Diagnosis not present

## 2017-03-31 DIAGNOSIS — M7752 Other enthesopathy of left foot: Secondary | ICD-10-CM

## 2017-03-31 DIAGNOSIS — M778 Other enthesopathies, not elsewhere classified: Secondary | ICD-10-CM

## 2017-03-31 DIAGNOSIS — M779 Enthesopathy, unspecified: Secondary | ICD-10-CM

## 2017-03-31 NOTE — Patient Instructions (Signed)
To your x-ray examination demonstrated no bone joint activity at the knuckle of the big toe on left foot. The under lying bone at the knuckle, interphalangeal joint is tender directly palpation and I recommend wearing the gel pad on the toe daily Return to symptoms worsened over time

## 2017-03-31 NOTE — Progress Notes (Signed)
   Subjective:    Patient ID: Megan [name], female    DOB: 03-Jan-1956, 61 y.o.   MRN: [id number]  HPI This patient presents today complaining of a local tenderness in the plantar aspect of the interphalangeal joint left hallux. Patient describes a cut in the area several months ago which ultimately healed. She is now concerned in the last 2 weeks she has status on weightbearing on the plantar aspect interphalangeal joint, aggravated with weightbearing and standing and relieved with rest and elevation. and would like to have it evaluated.  Patient has complex medical history with possible MS diagnosis? Patient has a history of blood dyscrasia Patient relates a history of difficulty healing small cuts Patient denies history of diabetes Patient denies smoking history  possible pending diagnosis of MS still not confirmed  Review of Systems  All other systems reviewed and are negative.      Objective:   Physical Exam   Orientated 3  Vascular: No calf edema or calf tenderness bilaterally DP and PT pulses 2/4 bilaterally Capillary reflex immediate bilaterally  Neurological: Sensation to 10 g monofilament wire intact 9/9 bilaterally Vibratory sensation nonreactive bilaterally Ankle reflexes reactive bilaterally  Dermatological: No open skin lesions bilaterally No evidence of scarring on the plantar aspect the left hallux Plantar skin left hallux is supple without any palpable lesions The medial and lateral margins the right hallux toenail are mildly incurvated with a callused nail groove and low-grade erythema. There is no drainage or warmth  Musculoskeletal: Manual motor testing dorsi flexion, plantar flexion, inversion, eversion 5/5 bilaterally Palpable tenderness plantar left hallux interphalangeal joint without any palpable lesions Mild functional restriction of left first MPJ  X-ray examination weightbearing 03/31/2017  Intact bony structure without a fracture  and/or dislocation Mild decreased joint space first MPJ  Radiographic impression: No acute bony abnormality noted in the weightbearing x-ray of the left foot dated 03/31/2017        Assessment & Plan:   Assessment:  Capsulitis plantar interphalangeal joint left hallux Functional hallux limitus may contribute to this problem  Plan: Today I reviewed the results of the exam and x-ray with patient today A silicone gel pad was dispensed to pad off the plantar interphalangeal joint. Patient instructed wear athletic style shoe wear thick sole shoe  Reappoint at patient's request

## 2017-04-02 DIAGNOSIS — F4322 Adjustment disorder with anxiety: Secondary | ICD-10-CM | POA: Diagnosis not present

## 2017-04-06 DIAGNOSIS — A6920 Lyme disease, unspecified: Secondary | ICD-10-CM | POA: Diagnosis not present

## 2017-04-06 DIAGNOSIS — K909 Intestinal malabsorption, unspecified: Secondary | ICD-10-CM | POA: Diagnosis not present

## 2017-04-06 DIAGNOSIS — B379 Candidiasis, unspecified: Secondary | ICD-10-CM | POA: Diagnosis not present

## 2017-04-06 DIAGNOSIS — G609 Hereditary and idiopathic neuropathy, unspecified: Secondary | ICD-10-CM | POA: Diagnosis not present

## 2017-04-06 DIAGNOSIS — R5383 Other fatigue: Secondary | ICD-10-CM | POA: Diagnosis not present

## 2017-04-06 DIAGNOSIS — B600 Babesiosis: Secondary | ICD-10-CM | POA: Diagnosis not present

## 2017-04-09 ENCOUNTER — Encounter: Payer: Self-pay | Admitting: Family Medicine

## 2017-04-09 ENCOUNTER — Ambulatory Visit (INDEPENDENT_AMBULATORY_CARE_PROVIDER_SITE_OTHER): Payer: BLUE CROSS/BLUE SHIELD | Admitting: Family Medicine

## 2017-04-09 VITALS — BP 105/71 | Ht 69.0 in | Wt 130.0 lb

## 2017-04-09 DIAGNOSIS — F4322 Adjustment disorder with anxiety: Secondary | ICD-10-CM | POA: Diagnosis not present

## 2017-04-09 DIAGNOSIS — M79675 Pain in left toe(s): Secondary | ICD-10-CM

## 2017-04-09 NOTE — Patient Instructions (Addendum)
We will go ahead with an MRI of your great toe to assess for a foreign body - on the ultrasound there appears to be an area of scar tissue and some swelling but it's possible this is a foreign body from when you cut your foot 3 months ago. Toe spacer as often as possible including when sleeping. Capsaicin, salon pas topically (or the cream that you have) up to 4 times a day.  MRI is:  Monday August 20th at [facility] [place] [employer]

## 2017-04-10 DIAGNOSIS — M79675 Pain in left toe(s): Secondary | ICD-10-CM | POA: Insufficient documentation

## 2017-04-10 NOTE — Assessment & Plan Note (Signed)
location of pain, prior laceration, small hyperechoic area concerning for possible retained foreign body (though appears more consistent with scar tissue).  She had x-rays showing no evidence of metallic foreign body.  Will go ahead with MRI to further assess.  In meantime toe spacer, capsaicin, salon pas, tylenol.

## 2017-04-10 NOTE — Progress Notes (Addendum)
PCP: Janith Lima, MD  Subjective:   HPI: Patient is a 61 y.o. female here for left foot pain.  Patient reports she has chronic bilateral foot pain with skin changes (blue, white, red at times - currently blue). She has seen specialists in past, due to see rheumatology coming up. Current problem for today's appointment however is she cut plantar aspect of her left great toe (over lateral aspect) about 2 months ago - did so when walking outside. Believes she cleaned this out fully and used peroxide. Appeared to be healing but has continued to have swelling and a sharp pain in this area. Difficulty walking due to severity of pain. No numbness.  Some thickening of skin.  Past Medical History:  Diagnosis Date  . Aneurysm of cardiac wall, congenital    a.  septal aneurysm extending into the RVOT (by cardiac MRI in 08/2013)  . Asthma   . Depression   . Hypothyroidism   . Lyme disease   . MVP (mitral valve prolapse)   . Orthostatic hypotension   . Osteoporosis   . Peripheral neuropathy 06/09/2012  . Plantar fasciitis   . Pneumothorax     Current Outpatient Prescriptions on File Prior to Visit  Medication Sig Dispense Refill  . 5-Hydroxytryptophan (5-HTP) 50 MG CAPS Take 50 mg by mouth daily.    . Alpha-Lipoic Acid 300 MG TABS Take 600 mg by mouth daily.    Francia Greaves THYROID 30 MG tablet Take 30 mg by mouth daily.  6  . BIOTIN PO Take 20,000 mcg by mouth.    . Cholecalciferol (VITAMIN D3) 10000 units capsule Take 10,000 Units by mouth daily.    . Coenzyme Q10 (CO Q 10) 100 MG CAPS Take 200 mg by mouth daily.    . Digestive Enzymes (DIGESTIVE ENZYME PO) Take 1 tablet by mouth daily.    [name] GLUTATHIONE PO Take 1 capsule by mouth daily.    [name] MAGNESIUM CITRATE PO Take 400 mg by mouth.    . Menaquinone-7 (VITAMIN K2 PO) Take 1 tablet by mouth daily.    . Misc Natural Products (CHLORELLA) 500 MG CAPS Take 500 mg by mouth daily.    . Multiple Vitamin (MULTIVITAMIN WITH MINERALS) TABS  Take 1 tablet by mouth daily.    . NON FORMULARY Take 2 tablets by mouth daily. "NT Energy Factor"    . NON FORMULARY Take 3 tablets by mouth daily. "Energy Multi Flex"    . NON FORMULARY Take 1 tablet by mouth daily. "GI Benefits" supplement    . Omega-3 Fatty Acids (FISH OIL) 1000 MG CAPS Take 1,000 mg by mouth daily.    . Probiotic Product (PROBIOTIC DAILY PO) Take 1 capsule by mouth. 1 capsule of 5 billion colonies     No current facility-administered medications on file prior to visit.     Past Surgical History:  Procedure Laterality Date  . COLONOSCOPY WITH PROPOFOL N/A 02/22/2013   Procedure: COLONOSCOPY WITH PROPOFOL;  Surgeon: Garlan Fair, MD;  Location: [facility];  Service: Endoscopy;  Laterality: N/A;  . MOUTH SURGERY  01/2012   bone graft in mouth  . NASAL SINUS SURGERY Right 06/12/2014   Procedure: RIGHT ENDOSCOPIC MAXILLARY ANTROSTOMY;  Surgeon: Ascencion Dike, MD;  Location: [facility] [place] [place];  Service: ENT;  Laterality: Right;  . ROOT CANAL  02/27/2012  . TONSILLECTOMY  1962    No Known Allergies  Social History   Social History  . Marital status: Divorced  Spouse name: N/A  . Number of children: 0  . Years of education: N/A   Occupational History  .      Career and life coach   Social History Main Topics  . Smoking status: Never Smoker  . Smokeless tobacco: Never Used  . Alcohol use No  . Drug use: No  . Sexual activity: Not on file   Other Topics Concern  . Not on file   Social History Narrative   Patient is single and lives alone.   Patient is self-employed, career and life coaching.   Patient drinks two to four cups of caffeine daily.    Family History  Problem Relation Age of Onset  . Hyperlipidemia Father   . Heart failure Father   . Prostate cancer Father   . Kidney failure Father   . Hypertension Father   . Angina Mother   . Hypertension Mother   . Lung cancer Mother   . Colon cancer Mother   . Lung cancer Paternal  Grandfather   . Breast cancer Paternal Grandmother   . Diabetes Paternal Grandmother     BP 105/71   Ht 5\' 9"  (1.753 m)   Wt 130 lb (59 kg)   BMI 19.20 kg/m   Review of Systems: See HPI above.     Objective:  Physical Exam:  Gen: NAD, comfortable in exam room  Left foot/ankle: Dark, blue discoloration of both feet but cap refill < 3 sec. No malrotation of angulation of digits. Thickening of skin lateral aspect of great toe. No palpable foreign body or mass. FROM digits and ankle without pain. TTP in thickened area plantar lateral great toe.  Right foot/ankle: Discoloration noted above. FROM digits and ankles without pain. No masses, tenderness.  MSK u/s left great toe:  Small hyperechoic area in location of pain but appears as scar tissue, not obvious foreign body.  No cortical irregularity.  Flexor hallucis intact without abnormalities.   Assessment & Plan:  1. Left great toe pain - location of pain, prior laceration, small hyperechoic area concerning for possible retained foreign body (though appears more consistent with scar tissue).  She had x-rays showing no evidence of metallic foreign body.  Will go ahead with MRI to further assess.  In meantime toe spacer, capsaicin, salon pas, tylenol.  Addendum:  MRI reviewed and discussed with patient.  She does have a small T2 hyperintense lesion in this area measuring 4x78mm, not seen on radiographs.  I suspect this is either a small cyst or hematoma - no evidence clinically this is an abscess and would be unusual location for a sarcoma or other soft tissue mass.  We discussed referral to different podiatrist for removal if this continues to be an issue despite conservative measures noted at our [place], MRI with contrast though discussed if this is still bothering her it may be pointless to do this because would still recommend excision at that point which would be diagnostic and therapeutic, would treat same regardless of if if appeared  to be a sarcoma.  She will continue conservative treatment for 4-6 weeks and we will reassess at that time in the office.

## 2017-04-11 ENCOUNTER — Other Ambulatory Visit (HOSPITAL_BASED_OUTPATIENT_CLINIC_OR_DEPARTMENT_OTHER): Payer: BLUE CROSS/BLUE SHIELD

## 2017-04-13 DIAGNOSIS — M25872 Other specified joint disorders, left ankle and foot: Secondary | ICD-10-CM | POA: Diagnosis not present

## 2017-04-13 DIAGNOSIS — M19072 Primary osteoarthritis, left ankle and foot: Secondary | ICD-10-CM | POA: Diagnosis not present

## 2017-04-15 DIAGNOSIS — Z85828 Personal history of other malignant neoplasm of skin: Secondary | ICD-10-CM | POA: Diagnosis not present

## 2017-04-15 DIAGNOSIS — L821 Other seborrheic keratosis: Secondary | ICD-10-CM | POA: Diagnosis not present

## 2017-04-15 DIAGNOSIS — E639 Nutritional deficiency, unspecified: Secondary | ICD-10-CM | POA: Diagnosis not present

## 2017-04-15 DIAGNOSIS — L570 Actinic keratosis: Secondary | ICD-10-CM | POA: Diagnosis not present

## 2017-04-16 DIAGNOSIS — F4322 Adjustment disorder with anxiety: Secondary | ICD-10-CM | POA: Diagnosis not present

## 2017-04-22 DIAGNOSIS — R200 Anesthesia of skin: Secondary | ICD-10-CM | POA: Diagnosis not present

## 2017-04-22 DIAGNOSIS — G5792 Unspecified mononeuropathy of left lower limb: Secondary | ICD-10-CM | POA: Diagnosis not present

## 2017-04-22 DIAGNOSIS — M792 Neuralgia and neuritis, unspecified: Secondary | ICD-10-CM | POA: Insufficient documentation

## 2017-04-22 DIAGNOSIS — R238 Other skin changes: Secondary | ICD-10-CM | POA: Diagnosis not present

## 2017-04-23 ENCOUNTER — Ambulatory Visit (INDEPENDENT_AMBULATORY_CARE_PROVIDER_SITE_OTHER): Payer: BLUE CROSS/BLUE SHIELD | Admitting: Internal Medicine

## 2017-04-23 VITALS — BP 114/76 | HR 58 | Temp 97.6°F | Ht 69.0 in | Wt 132.0 lb

## 2017-04-23 DIAGNOSIS — E559 Vitamin D deficiency, unspecified: Secondary | ICD-10-CM | POA: Diagnosis not present

## 2017-04-23 DIAGNOSIS — R5381 Other malaise: Secondary | ICD-10-CM | POA: Diagnosis not present

## 2017-04-23 DIAGNOSIS — L659 Nonscarring hair loss, unspecified: Secondary | ICD-10-CM

## 2017-04-23 DIAGNOSIS — F4322 Adjustment disorder with anxiety: Secondary | ICD-10-CM | POA: Diagnosis not present

## 2017-04-23 NOTE — Patient Instructions (Signed)
Please have your thyroid checked with your provider, as too much leads to hair loss  Please consider Dermatology for the possible patch on the left side  OK to use Rogaine for Men Daily   Please continue all other medications as before, and refills have been done if requested.  Please have the pharmacy call with any other refills you may need.  Please keep your appointments with your specialists as you may have planned

## 2017-04-23 NOTE — Progress Notes (Signed)
Subjective:    Patient ID: Megan [name], female    DOB: Dec 31, 1955, 61 y.o.   MRN: [id number]  HPI  Here to f/u gradual hair thinning over months, Denies hyper or hypo thyroid symptoms such as voice, skin or hair change. Had TSH checked with holistic provider who rx her armour thyroid, but no recent check in last few months.  Pt denies chest pain, increased sob or doe, wheezing, orthopnea, PND, increased LE swelling, palpitations, dizziness or syncope.  No other scalp changes or erythema. Pt denies new neurological symptoms such as new headache/ Past Medical History:  Diagnosis Date  . Aneurysm of cardiac wall, congenital    a.  septal aneurysm extending into the RVOT (by cardiac MRI in 08/2013)  . Asthma   . Depression   . Hypothyroidism   . Lyme disease   . MVP (mitral valve prolapse)   . Orthostatic hypotension   . Osteoporosis   . Peripheral neuropathy 06/09/2012  . Plantar fasciitis   . Pneumothorax    Past Surgical History:  Procedure Laterality Date  . COLONOSCOPY WITH PROPOFOL N/A 02/22/2013   Procedure: COLONOSCOPY WITH PROPOFOL;  Surgeon: Garlan Fair, MD;  Location: [facility];  Service: Endoscopy;  Laterality: N/A;  . MOUTH SURGERY  01/2012   bone graft in mouth  . NASAL SINUS SURGERY Right 06/12/2014   Procedure: RIGHT ENDOSCOPIC MAXILLARY ANTROSTOMY;  Surgeon: Ascencion Dike, MD;  Location: [facility];  Service: ENT;  Laterality: Right;  . ROOT CANAL  02/27/2012  . TONSILLECTOMY  1962    reports that she has never smoked. She has never used smokeless tobacco. She reports that she does not drink alcohol or use drugs. family history includes Angina in her mother; Breast cancer in her paternal grandmother; Colon cancer in her mother; Diabetes in her paternal grandmother; Heart failure in her father; Hyperlipidemia in her father; Hypertension in her father and mother; Kidney failure in her father; Lung cancer in her mother and paternal grandfather;  Prostate cancer in her father. No Known Allergies Current Outpatient Prescriptions on File Prior to Visit  Medication Sig Dispense Refill  . 5-Hydroxytryptophan (5-HTP) 50 MG CAPS Take 50 mg by mouth daily.    . Alpha-Lipoic Acid 300 MG TABS Take 600 mg by mouth daily.    Francia Greaves THYROID 30 MG tablet Take 30 mg by mouth daily.  6  . BIOTIN PO Take 20,000 mcg by mouth.    . Cholecalciferol (VITAMIN D3) 10000 units capsule Take 10,000 Units by mouth daily.    . Coenzyme Q10 (CO Q 10) 100 MG CAPS Take 200 mg by mouth daily.    . Digestive Enzymes (DIGESTIVE ENZYME PO) Take 1 tablet by mouth daily.    [name] GLUTATHIONE PO Take 1 capsule by mouth daily.    [name] MAGNESIUM CITRATE PO Take 400 mg by mouth.    . Menaquinone-7 (VITAMIN K2 PO) Take 1 tablet by mouth daily.    . Misc Natural Products (CHLORELLA) 500 MG CAPS Take 500 mg by mouth daily.    . Multiple Vitamin (MULTIVITAMIN WITH MINERALS) TABS Take 1 tablet by mouth daily.    . NON FORMULARY Take 2 tablets by mouth daily. "NT Energy Factor"    . NON FORMULARY Take 3 tablets by mouth daily. "Energy Multi Flex"    . NON FORMULARY Take 1 tablet by mouth daily. "GI Benefits" supplement    . Omega-3 Fatty Acids (FISH OIL) 1000 MG CAPS Take  1,000 mg by mouth daily.    . Probiotic Product (PROBIOTIC DAILY PO) Take 1 capsule by mouth. 1 capsule of 5 billion colonies     No current facility-administered medications on file prior to visit.    Review of Systems  Constitutional: Negative for other unusual diaphoresis or sweats HENT: Negative for ear discharge or swelling Eyes: Negative for other worsening visual disturbances Respiratory: Negative for stridor or other swelling  Gastrointestinal: Negative for worsening distension or other blood Genitourinary: Negative for retention or other urinary change Musculoskeletal: Negative for other MSK pain or swelling Skin: Negative for color change or other new lesions Neurological: Negative for worsening  tremors and other numbness  Psychiatric/Behavioral: Negative for worsening agitation or other fatigue All other system neg per pt    Objective:   Physical Exam BP 114/76   Pulse (!) 58   Temp 97.6 F (36.4 C) (Oral)   Ht 5\' 9"  (1.753 m)   Wt 132 lb (59.9 kg)   SpO2 98%   BMI 19.49 kg/m  VS noted,  Constitutional: Pt appears in NAD HENT: Head: NCAT.  Right Ear: External ear normal.  Left Ear: External ear normal.  Eyes: . Pupils are equal, round, and reactive to light. Conjunctivae and EOM are normal Nose: without d/c or deformity Neck: Neck supple. Gross normal ROM Cardiovascular: Normal rate and regular rhythm.   Pulmonary/Chest: Effort normal and breath sounds without rales or wheezing.  Neurological: Pt is alert. At baseline orientation, motor grossly intact Skin: Skin is warm. No rashes, other new lesions, no LE edema, has some mild diffuse hair thinning to scalp without other scalp changes, has 1/2 cm possible patchy loss left side parietal area Psychiatric: Pt behavior is normal without agitation  No other eam findings     Assessment & Plan:

## 2017-04-24 DIAGNOSIS — M25572 Pain in left ankle and joints of left foot: Secondary | ICD-10-CM | POA: Diagnosis not present

## 2017-04-24 DIAGNOSIS — M25571 Pain in right ankle and joints of right foot: Secondary | ICD-10-CM | POA: Diagnosis not present

## 2017-04-25 DIAGNOSIS — L659 Nonscarring hair loss, unspecified: Secondary | ICD-10-CM | POA: Insufficient documentation

## 2017-04-25 NOTE — Assessment & Plan Note (Signed)
?   Telogen effluviam vs other, advised pt to f/u with holistic provider as she prefers regarding thyroid level as too high dose leads to hair loss; also rogaine for men, and pt states she will call her preferred dermatologist as well, no indication for topical steroid or antifungal at this time,  to f/u any worsening symptoms or concerns

## 2017-04-29 DIAGNOSIS — L659 Nonscarring hair loss, unspecified: Secondary | ICD-10-CM | POA: Diagnosis not present

## 2017-04-29 DIAGNOSIS — F4322 Adjustment disorder with anxiety: Secondary | ICD-10-CM | POA: Diagnosis not present

## 2017-04-30 DIAGNOSIS — L659 Nonscarring hair loss, unspecified: Secondary | ICD-10-CM | POA: Diagnosis not present

## 2017-04-30 DIAGNOSIS — L650 Telogen effluvium: Secondary | ICD-10-CM | POA: Diagnosis not present

## 2017-05-05 DIAGNOSIS — N951 Menopausal and female climacteric states: Secondary | ICD-10-CM | POA: Diagnosis not present

## 2017-05-05 DIAGNOSIS — R5381 Other malaise: Secondary | ICD-10-CM | POA: Diagnosis not present

## 2017-05-05 DIAGNOSIS — R5383 Other fatigue: Secondary | ICD-10-CM | POA: Diagnosis not present

## 2017-05-05 DIAGNOSIS — L659 Nonscarring hair loss, unspecified: Secondary | ICD-10-CM | POA: Diagnosis not present

## 2017-05-06 DIAGNOSIS — B600 Babesiosis: Secondary | ICD-10-CM | POA: Diagnosis not present

## 2017-05-06 DIAGNOSIS — G609 Hereditary and idiopathic neuropathy, unspecified: Secondary | ICD-10-CM | POA: Diagnosis not present

## 2017-05-06 DIAGNOSIS — K909 Intestinal malabsorption, unspecified: Secondary | ICD-10-CM | POA: Diagnosis not present

## 2017-05-06 DIAGNOSIS — R5383 Other fatigue: Secondary | ICD-10-CM | POA: Diagnosis not present

## 2017-05-06 DIAGNOSIS — A6920 Lyme disease, unspecified: Secondary | ICD-10-CM | POA: Diagnosis not present

## 2017-05-06 DIAGNOSIS — B379 Candidiasis, unspecified: Secondary | ICD-10-CM | POA: Diagnosis not present

## 2017-05-07 ENCOUNTER — Other Ambulatory Visit: Payer: Self-pay | Admitting: Family Medicine

## 2017-05-07 DIAGNOSIS — E041 Nontoxic single thyroid nodule: Secondary | ICD-10-CM

## 2017-05-07 DIAGNOSIS — F4322 Adjustment disorder with anxiety: Secondary | ICD-10-CM | POA: Diagnosis not present

## 2017-05-12 ENCOUNTER — Encounter: Payer: Self-pay | Admitting: Family Medicine

## 2017-05-13 ENCOUNTER — Ambulatory Visit: Payer: BLUE CROSS/BLUE SHIELD | Admitting: Family Medicine

## 2017-05-13 ENCOUNTER — Ambulatory Visit
Admission: RE | Admit: 2017-05-13 | Discharge: 2017-05-13 | Disposition: A | Discharge status: Home / Self Care | Payer: BLUE CROSS/BLUE SHIELD | Source: Ambulatory Visit | Attending: Family Medicine | Admitting: Family Medicine

## 2017-05-13 DIAGNOSIS — E042 Nontoxic multinodular goiter: Secondary | ICD-10-CM | POA: Diagnosis not present

## 2017-05-13 DIAGNOSIS — F4322 Adjustment disorder with anxiety: Secondary | ICD-10-CM | POA: Diagnosis not present

## 2017-05-13 DIAGNOSIS — E041 Nontoxic single thyroid nodule: Secondary | ICD-10-CM

## 2017-05-15 ENCOUNTER — Ambulatory Visit: Payer: BLUE CROSS/BLUE SHIELD | Admitting: Family Medicine

## 2017-05-15 DIAGNOSIS — Z85828 Personal history of other malignant neoplasm of skin: Secondary | ICD-10-CM | POA: Diagnosis not present

## 2017-05-15 DIAGNOSIS — L650 Telogen effluvium: Secondary | ICD-10-CM | POA: Diagnosis not present

## 2017-05-15 DIAGNOSIS — L852 Keratosis punctata (palmaris et plantaris): Secondary | ICD-10-CM | POA: Diagnosis not present

## 2017-05-18 DIAGNOSIS — E039 Hypothyroidism, unspecified: Secondary | ICD-10-CM | POA: Diagnosis not present

## 2017-05-19 DIAGNOSIS — M79604 Pain in right leg: Secondary | ICD-10-CM | POA: Diagnosis not present

## 2017-05-19 DIAGNOSIS — G609 Hereditary and idiopathic neuropathy, unspecified: Secondary | ICD-10-CM | POA: Diagnosis not present

## 2017-05-19 DIAGNOSIS — M79605 Pain in left leg: Secondary | ICD-10-CM | POA: Diagnosis not present

## 2017-05-21 DIAGNOSIS — F4322 Adjustment disorder with anxiety: Secondary | ICD-10-CM | POA: Diagnosis not present

## 2017-05-21 DIAGNOSIS — R109 Unspecified abdominal pain: Secondary | ICD-10-CM | POA: Diagnosis not present

## 2017-05-28 DIAGNOSIS — L659 Nonscarring hair loss, unspecified: Secondary | ICD-10-CM | POA: Diagnosis not present

## 2017-05-28 DIAGNOSIS — R1030 Lower abdominal pain, unspecified: Secondary | ICD-10-CM | POA: Diagnosis not present

## 2017-06-02 ENCOUNTER — Encounter: Payer: Self-pay | Admitting: Internal Medicine

## 2017-06-03 DIAGNOSIS — R6882 Decreased libido: Secondary | ICD-10-CM | POA: Diagnosis not present

## 2017-06-03 DIAGNOSIS — L821 Other seborrheic keratosis: Secondary | ICD-10-CM | POA: Diagnosis not present

## 2017-06-03 DIAGNOSIS — G609 Hereditary and idiopathic neuropathy, unspecified: Secondary | ICD-10-CM | POA: Diagnosis not present

## 2017-06-03 DIAGNOSIS — B379 Candidiasis, unspecified: Secondary | ICD-10-CM | POA: Diagnosis not present

## 2017-06-03 DIAGNOSIS — R5383 Other fatigue: Secondary | ICD-10-CM | POA: Diagnosis not present

## 2017-06-03 DIAGNOSIS — L659 Nonscarring hair loss, unspecified: Secondary | ICD-10-CM | POA: Diagnosis not present

## 2017-06-03 DIAGNOSIS — L570 Actinic keratosis: Secondary | ICD-10-CM | POA: Diagnosis not present

## 2017-06-03 DIAGNOSIS — L650 Telogen effluvium: Secondary | ICD-10-CM | POA: Diagnosis not present

## 2017-06-03 DIAGNOSIS — A6920 Lyme disease, unspecified: Secondary | ICD-10-CM | POA: Diagnosis not present

## 2017-06-03 DIAGNOSIS — F4322 Adjustment disorder with anxiety: Secondary | ICD-10-CM | POA: Diagnosis not present

## 2017-06-03 DIAGNOSIS — Z85828 Personal history of other malignant neoplasm of skin: Secondary | ICD-10-CM | POA: Diagnosis not present

## 2017-06-09 DIAGNOSIS — R5383 Other fatigue: Secondary | ICD-10-CM | POA: Diagnosis not present

## 2017-06-09 DIAGNOSIS — N951 Menopausal and female climacteric states: Secondary | ICD-10-CM | POA: Diagnosis not present

## 2017-06-09 DIAGNOSIS — E039 Hypothyroidism, unspecified: Secondary | ICD-10-CM | POA: Diagnosis not present

## 2017-06-09 DIAGNOSIS — L659 Nonscarring hair loss, unspecified: Secondary | ICD-10-CM | POA: Diagnosis not present

## 2017-06-22 DIAGNOSIS — M79605 Pain in left leg: Secondary | ICD-10-CM | POA: Diagnosis not present

## 2017-06-22 DIAGNOSIS — G609 Hereditary and idiopathic neuropathy, unspecified: Secondary | ICD-10-CM | POA: Diagnosis not present

## 2017-06-22 DIAGNOSIS — M79604 Pain in right leg: Secondary | ICD-10-CM | POA: Diagnosis not present

## 2017-06-23 DIAGNOSIS — E611 Iron deficiency: Secondary | ICD-10-CM | POA: Diagnosis not present

## 2017-06-23 DIAGNOSIS — L659 Nonscarring hair loss, unspecified: Secondary | ICD-10-CM | POA: Diagnosis not present

## 2017-06-23 DIAGNOSIS — E039 Hypothyroidism, unspecified: Secondary | ICD-10-CM | POA: Diagnosis not present

## 2017-06-23 DIAGNOSIS — R5383 Other fatigue: Secondary | ICD-10-CM | POA: Diagnosis not present

## 2017-06-23 DIAGNOSIS — E639 Nutritional deficiency, unspecified: Secondary | ICD-10-CM | POA: Diagnosis not present

## 2017-06-23 DIAGNOSIS — F4322 Adjustment disorder with anxiety: Secondary | ICD-10-CM | POA: Diagnosis not present

## 2017-06-23 DIAGNOSIS — K5909 Other constipation: Secondary | ICD-10-CM | POA: Diagnosis not present

## 2017-06-23 DIAGNOSIS — L650 Telogen effluvium: Secondary | ICD-10-CM | POA: Diagnosis not present

## 2017-07-02 DIAGNOSIS — F4322 Adjustment disorder with anxiety: Secondary | ICD-10-CM | POA: Diagnosis not present

## 2017-07-08 DIAGNOSIS — F4322 Adjustment disorder with anxiety: Secondary | ICD-10-CM | POA: Diagnosis not present

## 2017-07-22 DIAGNOSIS — A6920 Lyme disease, unspecified: Secondary | ICD-10-CM | POA: Diagnosis not present

## 2017-07-22 DIAGNOSIS — L659 Nonscarring hair loss, unspecified: Secondary | ICD-10-CM | POA: Diagnosis not present

## 2017-07-22 DIAGNOSIS — R5383 Other fatigue: Secondary | ICD-10-CM | POA: Diagnosis not present

## 2017-07-22 DIAGNOSIS — G609 Hereditary and idiopathic neuropathy, unspecified: Secondary | ICD-10-CM | POA: Diagnosis not present

## 2017-07-22 DIAGNOSIS — B379 Candidiasis, unspecified: Secondary | ICD-10-CM | POA: Diagnosis not present

## 2017-07-22 DIAGNOSIS — R6882 Decreased libido: Secondary | ICD-10-CM | POA: Diagnosis not present

## 2017-07-23 DIAGNOSIS — G5792 Unspecified mononeuropathy of left lower limb: Secondary | ICD-10-CM | POA: Diagnosis not present

## 2017-07-23 DIAGNOSIS — F4322 Adjustment disorder with anxiety: Secondary | ICD-10-CM | POA: Diagnosis not present

## 2017-07-27 DIAGNOSIS — Z800 Family history of malignant neoplasm of digestive organs: Secondary | ICD-10-CM | POA: Diagnosis not present

## 2017-07-28 DIAGNOSIS — M9903 Segmental and somatic dysfunction of lumbar region: Secondary | ICD-10-CM | POA: Diagnosis not present

## 2017-07-28 DIAGNOSIS — M62830 Muscle spasm of back: Secondary | ICD-10-CM | POA: Diagnosis not present

## 2017-07-28 DIAGNOSIS — M9902 Segmental and somatic dysfunction of thoracic region: Secondary | ICD-10-CM | POA: Diagnosis not present

## 2017-07-28 DIAGNOSIS — M9905 Segmental and somatic dysfunction of pelvic region: Secondary | ICD-10-CM | POA: Diagnosis not present

## 2017-08-03 ENCOUNTER — Encounter: Payer: Self-pay | Admitting: Internal Medicine

## 2017-08-04 ENCOUNTER — Ambulatory Visit: Payer: BLUE CROSS/BLUE SHIELD | Admitting: Internal Medicine

## 2017-08-06 DIAGNOSIS — F4322 Adjustment disorder with anxiety: Secondary | ICD-10-CM | POA: Diagnosis not present

## 2017-08-10 DIAGNOSIS — G609 Hereditary and idiopathic neuropathy, unspecified: Secondary | ICD-10-CM | POA: Diagnosis not present

## 2017-08-11 DIAGNOSIS — M9902 Segmental and somatic dysfunction of thoracic region: Secondary | ICD-10-CM | POA: Diagnosis not present

## 2017-08-11 DIAGNOSIS — M62830 Muscle spasm of back: Secondary | ICD-10-CM | POA: Diagnosis not present

## 2017-08-11 DIAGNOSIS — M9903 Segmental and somatic dysfunction of lumbar region: Secondary | ICD-10-CM | POA: Diagnosis not present

## 2017-08-11 DIAGNOSIS — M9905 Segmental and somatic dysfunction of pelvic region: Secondary | ICD-10-CM | POA: Diagnosis not present

## 2017-08-13 DIAGNOSIS — F4322 Adjustment disorder with anxiety: Secondary | ICD-10-CM | POA: Diagnosis not present

## 2017-08-13 DIAGNOSIS — M8589 Other specified disorders of bone density and structure, multiple sites: Secondary | ICD-10-CM | POA: Diagnosis not present

## 2017-08-13 DIAGNOSIS — M810 Age-related osteoporosis without current pathological fracture: Secondary | ICD-10-CM | POA: Diagnosis not present

## 2017-08-14 DIAGNOSIS — L659 Nonscarring hair loss, unspecified: Secondary | ICD-10-CM | POA: Diagnosis not present

## 2017-08-14 DIAGNOSIS — E039 Hypothyroidism, unspecified: Secondary | ICD-10-CM | POA: Diagnosis not present

## 2017-08-20 DIAGNOSIS — B370 Candidal stomatitis: Secondary | ICD-10-CM | POA: Diagnosis not present

## 2017-08-24 DIAGNOSIS — N951 Menopausal and female climacteric states: Secondary | ICD-10-CM | POA: Diagnosis not present

## 2017-08-24 DIAGNOSIS — E039 Hypothyroidism, unspecified: Secondary | ICD-10-CM | POA: Diagnosis not present

## 2017-08-24 DIAGNOSIS — R5383 Other fatigue: Secondary | ICD-10-CM | POA: Diagnosis not present

## 2017-08-24 DIAGNOSIS — D709 Neutropenia, unspecified: Secondary | ICD-10-CM | POA: Diagnosis not present

## 2017-08-24 DIAGNOSIS — R5381 Other malaise: Secondary | ICD-10-CM | POA: Diagnosis not present

## 2017-08-25 HISTORY — PX: COLONOSCOPY: SHX174

## 2017-09-01 DIAGNOSIS — E039 Hypothyroidism, unspecified: Secondary | ICD-10-CM | POA: Diagnosis not present

## 2017-09-01 DIAGNOSIS — J310 Chronic rhinitis: Secondary | ICD-10-CM | POA: Diagnosis not present

## 2017-09-01 DIAGNOSIS — J320 Chronic maxillary sinusitis: Secondary | ICD-10-CM | POA: Diagnosis not present

## 2017-09-01 DIAGNOSIS — J343 Hypertrophy of nasal turbinates: Secondary | ICD-10-CM | POA: Diagnosis not present

## 2017-09-02 DIAGNOSIS — Z01419 Encounter for gynecological examination (general) (routine) without abnormal findings: Secondary | ICD-10-CM | POA: Diagnosis not present

## 2017-09-02 DIAGNOSIS — Z681 Body mass index (BMI) 19 or less, adult: Secondary | ICD-10-CM | POA: Diagnosis not present

## 2017-09-02 DIAGNOSIS — Z1151 Encounter for screening for human papillomavirus (HPV): Secondary | ICD-10-CM | POA: Diagnosis not present

## 2017-09-09 DIAGNOSIS — F4322 Adjustment disorder with anxiety: Secondary | ICD-10-CM | POA: Diagnosis not present

## 2017-09-10 DIAGNOSIS — L650 Telogen effluvium: Secondary | ICD-10-CM | POA: Diagnosis not present

## 2017-09-10 DIAGNOSIS — L219 Seborrheic dermatitis, unspecified: Secondary | ICD-10-CM | POA: Diagnosis not present

## 2017-09-15 DIAGNOSIS — G609 Hereditary and idiopathic neuropathy, unspecified: Secondary | ICD-10-CM | POA: Diagnosis not present

## 2017-09-16 DIAGNOSIS — F4322 Adjustment disorder with anxiety: Secondary | ICD-10-CM | POA: Diagnosis not present

## 2017-09-18 DIAGNOSIS — Z85828 Personal history of other malignant neoplasm of skin: Secondary | ICD-10-CM | POA: Diagnosis not present

## 2017-09-18 DIAGNOSIS — M542 Cervicalgia: Secondary | ICD-10-CM | POA: Diagnosis not present

## 2017-09-18 DIAGNOSIS — L218 Other seborrheic dermatitis: Secondary | ICD-10-CM | POA: Diagnosis not present

## 2017-09-18 DIAGNOSIS — M546 Pain in thoracic spine: Secondary | ICD-10-CM | POA: Diagnosis not present

## 2017-09-18 DIAGNOSIS — L111 Transient acantholytic dermatosis [Grover]: Secondary | ICD-10-CM | POA: Diagnosis not present

## 2017-09-18 DIAGNOSIS — M810 Age-related osteoporosis without current pathological fracture: Secondary | ICD-10-CM | POA: Diagnosis not present

## 2017-09-22 ENCOUNTER — Other Ambulatory Visit: Payer: Self-pay

## 2017-09-22 ENCOUNTER — Ambulatory Visit: Payer: BLUE CROSS/BLUE SHIELD | Attending: Internal Medicine | Admitting: Physical Therapy

## 2017-09-22 ENCOUNTER — Encounter: Payer: Self-pay | Admitting: Physical Therapy

## 2017-09-22 DIAGNOSIS — M545 Low back pain, unspecified: Secondary | ICD-10-CM

## 2017-09-22 DIAGNOSIS — M62830 Muscle spasm of back: Secondary | ICD-10-CM

## 2017-09-22 DIAGNOSIS — M546 Pain in thoracic spine: Secondary | ICD-10-CM | POA: Diagnosis not present

## 2017-09-22 NOTE — Therapy (Signed)
[facility] [address] [place], [place], [address] Phone: [phone]   Fax:  [phone]  Physical Therapy Evaluation  Patient Details  Name: Megan [name] MRN: [id number] Date of Birth: 1956/08/12 Referring Provider: Lavone Orn, MD    Encounter Date: 09/22/2017  PT End of Session - 09/22/17 1417    Visit Number  1    Number of Visits  5    Date for PT Re-Evaluation  10/22/17    Authorization Type  [employer]    Authorization Time Period  09/22/17 to 10/22/17    PT Start Time  1230    PT Stop Time  1320    PT Time Calculation (min)  50 min    Activity Tolerance  Patient tolerated treatment well;No increased pain    Behavior During Therapy  WFL for tasks assessed/performed       Past Medical History:  Diagnosis Date  . Aneurysm of cardiac wall, congenital    a.  septal aneurysm extending into the RVOT (by cardiac MRI in 08/2013)  . Asthma   . Depression   . Hypothyroidism   . Lyme disease   . MVP (mitral valve prolapse)   . Orthostatic hypotension   . Osteoporosis   . Peripheral neuropathy 06/09/2012  . Plantar fasciitis   . Pneumothorax     Past Surgical History:  Procedure Laterality Date  . COLONOSCOPY WITH PROPOFOL N/A 02/22/2013   Procedure: COLONOSCOPY WITH PROPOFOL;  Surgeon: Garlan Fair, MD;  Location: [facility];  Service: Endoscopy;  Laterality: N/A;  . MOUTH SURGERY  01/2012   bone graft in mouth  . NASAL SINUS SURGERY Right 06/12/2014   Procedure: RIGHT ENDOSCOPIC MAXILLARY ANTROSTOMY;  Surgeon: Ascencion Dike, MD;  Location: [place];  Service: ENT;  Laterality: Right;  . ROOT CANAL  02/27/2012  . TONSILLECTOMY  1962    There were no vitals filed for this visit.   Subjective Assessment - 09/22/17 1406    Subjective  Pt reports that she has a history of osteoporosis for which she was recently receiving treatment at a program in town. She noticed after her 2nd day,  she had increase in pain from the neck down to the low back. She was worried that she injured something and saw her PCP who referred her to PT for evaluation and education regarding safe osteoporosis mangement techniques.     Pertinent History  osteoporosis, peripheral neuropathy    Patient Stated Goals  improve understanding of do's/dont's of osteoporosis and make sure she didn't injury anything    Currently in Pain?  No/denies         [facility] PT Assessment - 09/22/17 0001      Assessment   Medical Diagnosis  osteoporosis    Referring Provider  Lavone Orn, MD     Prior Therapy  none       Precautions   Precaution Comments  osteoporosis       Balance Screen   Has the patient fallen in the past 6 months  No    Has the patient had a decrease in activity level because of a fear of falling?   No    Is the patient reluctant to leave their home because of a fear of falling?   No      Prior Function   Level of Independence  Independent    Leisure  pt currently takes spin class 1x/week, gentle yoga 5x/week and walks  2-3 miles several days a week       Cognition   Overall Cognitive Status  Within Functional Limits for tasks assessed      Posture/Postural Control   Posture/Postural Control  No significant limitations      ROM / Strength   AROM / PROM / Strength  Strength      Strength   Overall Strength Comments  BLE strength 5/5 MMT; BUE strength 5/5 MMT       Flexibility   Soft Tissue Assessment /Muscle Length  yes    Hamstrings  WNL    Quadriceps  WNL    Piriformis  WNL      Palpation   Palpation comment  tender to palpation along lumbar/thoracic and cervical paraspinals             Objective measurements completed on examination: See above findings.      [facility] PT Treatment/Exercise - 09/22/17 0001      Self-Care   Self-Care  Other Self-Care Comments    Other Self-Care Comments   Osteoporosis do's and don'ts as well as general information regarding  osteoporosis management              PT Education - 09/22/17 1415    Education provided  Yes    Education Details  eval findings/POC; osteoporosis info and general dos/don'ts; anatomy of the back/spine and reasons for acute onset pain following exercise last week    Person(s) Educated  Patient    Methods  Explanation;Demonstration;Handout    Comprehension  Verbalized understanding       PT Short Term Goals - 09/22/17 1428      PT SHORT TERM GOAL #1   Title  Pt will demo good understanding of movements to avoid during the day/with activity that will decrease risk of vertebral fracture.     Time  2    Period  Weeks    Status  New    Target Date  10/06/17        PT Long Term Goals - 09/22/17 1429      PT LONG TERM GOAL #1   Title  Pt will report atleast 60% improvement in back pain from the start of therapy, to allow her to resume regular workout routine without issue or difficulty.    Time  4    Period  Weeks    Status  New    Target Date  10/22/17      PT LONG TERM GOAL #2   Title  Pt will be able to lift atleast 20# box with proper mechanics and without cuing from the therapist, 2/3 trials, which will place her at a decreased risk of injury to the spine.     Time  4    Period  Weeks    Status  New      PT LONG TERM GOAL #3   Title  Pt will verbalize atleast 5 precautions that patients with osteoporosis must take to decreas their risk of injury or fracture.     Time  4    Period  Weeks    Status  New             Plan - 09/22/17 1417    Clinical Impression Statement  Pt is a pleasant and motivated 62 y.o F with long history of osteoporosis who was referred by her physician to ensure the pt has proper education and understanding of osteoporosis to prevent future injury. She is generally  very active, exercising atleast 5x/week. She complains of acute pain along the cervical/thoracic/lumbar spine onset 5 days ago after completing a new workout. This has been  improving since the onset and appears to be musculoskeletal in nature. Pt has good LE/UE strength and was receptive to the education provided this visit. Therapist reviewed safe positions of the spine and importance of avoiding excessive flexion movements during her regular workout routine. Pt would benefit from a couple of sessions of skilled PT to further address education and improve understanding of lifting mechanics, exercise technique and ensure back pain continues to improve. Pt was agreeable to this.     History and Personal Factors relevant to plan of care:  osteoporosis    Clinical Presentation  Stable    Clinical Presentation due to:  gradually improving pain    Clinical Decision Making  Low    Rehab Potential  Excellent    PT Frequency  1x / week    PT Duration  4 weeks    PT Treatment/Interventions  ADLs/Self Care Home Management;Cryotherapy;Electrical Stimulation;Moist Heat;Therapeutic activities;Therapeutic exercise;Balance training;Patient/family education;Neuromuscular re-education;Dry needling;Passive range of motion    PT Next Visit Plan  provide osteoporosis exercises/decompression; f/u on pain improvements; lifting mechanics     Consulted and Agree with Plan of Care  Patient       Patient will benefit from skilled therapeutic intervention in order to improve the following deficits and impairments:  Pain, Improper body mechanics, Increased muscle spasms  Visit Diagnosis: Pain in thoracic spine  Muscle spasm of back  Acute bilateral low back pain without sciatica     Problem List Patient Active Problem List   Diagnosis Date Noted  . Hair loss 04/25/2017  . Great toe pain, left 04/10/2017  . Precordial chest pain 12/17/2016  . Aneurysm of right ventricle of heart 12/17/2016  . Chronic fatigue 03/05/2016  . Neuropathy associated with MGUS ([place]) 12/26/2013  . Orthostatic hypotension 08/01/2013  . Mitral valve prolapse 08/01/2013  . Spider veins of limb 02/15/2013   . Leukopenia 05/13/2012  . Bilateral Dorsal Foot pain 04/23/2011  . Gait abnormality 04/23/2011  . Leg length inequality 04/23/2011  . ASTHMA 11/16/2008  . OSTEOPOROSIS 10/19/2008  . Nonspecific (abnormal) findings on radiological and other examination of body structure 07/24/2008  . ABNORMAL CHEST XRAY 07/24/2008    2:34 PM,09/22/17 Sherol Dade PT, DPT [facility] [address], [place], [place], [address] Phone: [phone]   Fax:  [phone]  Name: Skyy Nilan MRN: [id number] Date of Birth: July 18, 1956

## 2017-09-22 NOTE — Patient Instructions (Signed)
DO's and DON'T's   Avoid and/or Minimize positions of forward bending ( flexion)  Side bending and rotation of the trunk  Especially when movements occur together   When your back aches:   Don't sit down   Lie down on your back with a small pillow under your head and one under your knees or as outlined by our therapist. Or, lie in the 90/90 position ( on the floor with your feet and legs on the sofa with knees and hips bent to 90 degrees)  Tying or putting on your shoes:   Don't bend over to tie your shoes or put on socks.  Instead, bring one foot up, cross it over the opposite knee and bend forward (hinge) at the hips to so the task.  Keep your back straight.  If you cannot do this safely, then you need to use long handled assistive devices such as a shoehorn and sock puller.  Exercising:  Don't engage in ballistic types of exercise routines such as high-impact aerobics or jumping rope  Don't do exercises in the gym that bring you forward (abdominal crunches, sit-ups, touching your  toes, knee-to-chest, straight leg raising.)  Follow a regular exercise program that includes a variety of different weight-bearing activities, such as low-impact aerobics, T' ai chi or walking as your physical therapist advises  Do exercises that emphasize return to normal body alignment and strengthening of the muscles that keep your back straight, as outlined in this program or by your therapist  Household tasks:  Don't reach unnecessarily or twist your trunk when mopping, sweeping, vacuuming, raking, making beds, weeding gardens, getting objects ou of cupboards, etc.  Keep your broom, mop, vacuum, or rake close to you and mover your whole body as you move them. Walk over to the area on which you are working. Arrange kitchen, bathroom, and bedroom shelves so that frequently used items may be reached without excessive bending, twisting, and reaching.  Use a  sturdy stool if necessary.  Don't bend from the waist to pick up something up  Off the floor, out of the trunk of your car, or to brush your teeth, wash your face, etc.   Bend at the knees, keeping back straight as possible. Use a reacher if necessary.   Prevention of fracture is the so-called "BOTTOm -Line" in the management of OSTEOPOROSIS. Do not take unnecessary chances in movement. Once a compression fracture occurs, the process is very difficult to control; one fracture is frequently followed by many more.     Osteoporosis   What is Osteoporosis?  - A silent disease in which the skeleton is weakened by decreased bone density. - Characterized by low bone mass, deterioration of bone, and increased risk of fracture postmenopausal (primary) or the result of an identifiable condition/event (secondary) - Commonly found in the wrists, spine, and hips; these are high-risk stress areas and very susceptible to fractures.  The Facts: - There are 1.5 million fractures/year o 500,000 spine; 250,000 hip with over 60,000 nursing home admissions secondary to hip fracture; and 200,000 wrist - After hip fracture, only 50% of people able to walk independently prior to the fracture return to independent ambulation. - Bone mass: Peaks at age 20-30, and begins declining at age 40-50.   Osteoporosis is defined by the World Health Organization (WHO) as:  NOF/WHO Criteria for Interpreting Results of Bone Density Assessment  Results Diagnosis  Within 1 standard deviation (SD) of young adult mean Normal  Between 1 and -2.5   SD below mean, repeat in 2 years Low bone mass (osteopenia)  Greater than -2.5 SD below mean Osteoporosis  Greater than -2.5 SD below mean and one or more fragility fractures exist Severe Osteoporosis  *Results can be affected by positioning of the body in the DEXA scan, presence of current or old fractures, arthritis, extraneous calcifications.    Osteoporosis is not just a  women's disease!  - 30-40% of women will develop osteoporosis - 5-15% of males will develop osteoporosis   What are the risk factors?  1. Female 2. Thin, small frame 3. Caucasian, Asian race 4. Early menopause (<45 years old)/amenorrhea/delayed puberty 5. Old age 6. Family history (fractures, stooped posture)\ 7. Low calcium diet 8. Sedentary lifestyle 9. Alcohol, Caffeine, Smoking 10. Malnutrition, GI Disease 11. Prolonged use of Glucocorticoids (Prednisone), Meds to treat asthma, arthritis, cancers, thyroid, and anti-seizure meds.  How do I know for sure?  Get a BONE DENSITY TEST!  This measures bone loss and it's painless, non-invasive, and only takes 5-10 minutes!  What can I do about it?  ? Decrease your risk factors (alcohol, caffeine, smoking) ? Helpful medications (see next page) ? Adequate Calcium and Vitamin D intake ? Get active! o Proper posture - Sit and stand tall! No slouching or twisting o Weight-Bearing Exercise - walking, stair climbing, elliptical; NO jogging or high-impact exercise. o Resistive Exercise - Cybex weight equipment, Nautilus, dumbbells, therabands  **Be sure to maintain proper alignment when lifting any weight!!  **When using equipment, avoid abdominal exercises which involve "crunching" or curling or twisting the trunk, biceps machines, cross-country machines, moving handlebars, or ANY MACHINE WITH ROTATION OR FORWARD BENDING!!!           Approved Pharmacologic Management of Osteoporosis  Agent Approved for prevention Approved for treatment BMD increased spine/hip Fracture reduction  Estrogen/Hormone Therapy (Estrace, Estratab, Ogen, Premarin, Vivell, Prempro, Femhert, Orthoest) Yes Yes 3-6% 35% spine and hip  Bisphosphonates  (Fosamax, Actonel, Boniva) Yes Yes 3-8% 35-50% spine and non-spine  Calcitonin (Miacalcin, Calcimar, Fortical) No Yes 0-3% None stated  Raloxifene (Evista) Yes Yes 2-3% 30-55%  Parathyroid  Hormone (Forteo) No Yes, only in those at high risk for fracture None stated 53-65%     Recommended Daily Calcium Intakes   Population Group NIH/NOF* (mg elemental calcium)  Children 1-10 years 800-1200  Children 11-24 years 1200-1500  Men and Women 25-64 years At least 1200  Pregnant/Lactating At least 1200  Postmenopausal women with hormone replacement therapy At least 1200  Postmenopausal women without   hormone replacement therapy At least 1200  Men and women 65 + At least 1200  *In 1987, 1990, 1994, and 2000, the NIH held consensus conferences on osteoporosis and calcium.  This column shows the most recent recommendations regarding calcium intak for preventing and managing osteoporosis.          Calcium Content of Selected Foods  Dairy Foods Calcium Content (mg) Non-Dairy Foods Calcium Content (mg)  Buttermilk, 1 cup 300 Calcium-fortified juice, 1 cup 300  Milk, 1 cup 300 Salmon, canned with Bones, 2 oz 100  Lactaid milk, 1 cup 300-500 Oysters, raw 13-19 medium 226  Soy milk, 1 cup 200-300 Sardines, canned with bones, 3 oz 372  Yogurt (plain, lowfat) 1 cup 250-300 Shrimp, canned 3 oz 98  Frozen yogurt (fruit) 1 cup 200-600 Collard greens, cooked 1 cup 357  Cheddar, mozzarella, or [place] cheese, 1oz 205 Broccoli, cooked 1 cup 78  Cottage cheese (lowfat) 4 oz 200 Soybeans, cooked 1   cup 131  Part-skim ricotta cheese, 4oz 335 Tofu, 4oz* *  Vanilla ice cream, 1 cup 120-300    *Calcium content of tofu varies depending on processing method; check nutritional label on package for precise calcium content.     Suggested Guidelines for Calcium Supplement Use:  ? Calcium is absorbed most efficiently if taken in small amounts throughout the day.  Always divide the daily dose into smaller amounts if the total daily dose is 500mg or more per day.  The body cannot use more than 500mg Calcium at any one time. ? The use of manufactured supplements is encouraged.   Calcium as bone meal or dolomite may contain lead or other heavy metals as contaminants. ? Calcium supplements should not be taken with high fiber meals or with bulk forming laxatives. ? If calcium carbonate is used as the supplement form, it should be taken with meals to assure that stomach acid production is present to facilitate optimal dissolution and absorption of calcium.  This is important if atrophic gastritis with hypo- or achlorhydria is present, which it is in 20-50% of older individuals. ? It is important to drink plenty of fluids while using the supplement to help reduce problems with side effects like constipation or bloating.  If these symptoms become a problem, switching to another form of supplement may be the answer. ? Another alternative is calcium-fortified foods, including fruit juices, cereals, and breads.  These foods are now marketed with added calcium and may be less likely to cause side effects. ? Those with personal or family histories of kidney stones should be monitored to assure that hypercalcuria does not occur. CALCIUM INTAKE QUIZ  Dairy products are the primary source of calcium for most people.  For a quick estimate of your daily calcium intake, complete the following steps:  1. Use the chart below to determine your daily intake of calcium from diary foods. Servings of dairy per day 1 2 3 4 5 6 7 8  Milligrams (mg) of calcium: 250 500 750 1000 1250 1500 1750 2000   2.  Enter your total daily calcium intake from dairy foods:     _____mg  3.  Add 350 mg, which is the average for all other dietary sources:                 +            350 mg  4.  The sum of your total daily calcium intake:               ______mg  5.  Enter the recommended calcium intake for your age from the chart below;         ______mg  6.  Enter your daily intake from step 4 above and subtract:                             -        _______mg  7.  The result is how much additional calcium you  need:                                          ______mg      Recommended Daily Calcium Intake  Population Calcium (mg)  Children 1-10 years 800-1200  Children 11-24 years 1200-1500  Men and women 25-64 At   least 1200  Pregnant/Lactating At least 1200  Postmenopausal women with hormone replacement therapy At least 1200  Postmenopausal women without hormone replacement therapy At least 1200  Men and women 65+ At least 1200       SAFETY TIPS FOR FALL PREVENTION   1. Remove throw rugs and make certain carpet edges are securely fastened to the floor.  2. Reduce clutter, especially in traffic areas of the home.   3. Install/maintain sturdy handrails at stairs.  4. Increase wattage of lighting in hallways, bathrooms, kitchens, stairwells, and entrances to home.  5. Use night-lights near bed, in hallways, and in bathroom to improve night safety.  6. Install safety handrails in shower, tub, and around toilet.  Bathtubs and shower stalls should have non-skid surfaces.  7. When you must reach for something high, use a safety step stool, one with wide steps and a friction surface to stand on.  A type equipped with a high handrail is preferred.  8. If a cane or other walking aid has been recommended, use it to help increase your stability.  9. Wear supportive, cushioned, low-heeled shoes.  Avoid "scuffs" (backless bedroom slippers) and high heels.  10. Avoid rushing to answer a phone, doorbell, or anything else!  A portable phone that you can take from room to room with you is a good idea for security and safety.  11. Exercise regularly and stay active!!    Resources  National Osteoporosis Foundation www.NOF.org   Exercise for Osteoporosis; A Safe and Effective Way to Build Bone Density and Muscle Strength By: Dianne Daniels, M.A.    [facility] [address], [address] [place], [place] [address] Phone # [phone] Fax [phone]  

## 2017-09-23 DIAGNOSIS — M9905 Segmental and somatic dysfunction of pelvic region: Secondary | ICD-10-CM | POA: Diagnosis not present

## 2017-09-23 DIAGNOSIS — M62830 Muscle spasm of back: Secondary | ICD-10-CM | POA: Diagnosis not present

## 2017-09-23 DIAGNOSIS — M9902 Segmental and somatic dysfunction of thoracic region: Secondary | ICD-10-CM | POA: Diagnosis not present

## 2017-09-23 DIAGNOSIS — M9903 Segmental and somatic dysfunction of lumbar region: Secondary | ICD-10-CM | POA: Diagnosis not present

## 2017-09-24 DIAGNOSIS — F4322 Adjustment disorder with anxiety: Secondary | ICD-10-CM | POA: Diagnosis not present

## 2017-09-25 ENCOUNTER — Ambulatory Visit (INDEPENDENT_AMBULATORY_CARE_PROVIDER_SITE_OTHER): Payer: BLUE CROSS/BLUE SHIELD | Admitting: Internal Medicine

## 2017-09-25 ENCOUNTER — Encounter: Payer: Self-pay | Admitting: Internal Medicine

## 2017-09-25 VITALS — BP 98/62 | HR 66 | Ht 69.5 in | Wt 131.5 lb

## 2017-09-25 DIAGNOSIS — Z800 Family history of malignant neoplasm of digestive organs: Secondary | ICD-10-CM | POA: Diagnosis not present

## 2017-09-25 DIAGNOSIS — K599 Functional intestinal disorder, unspecified: Secondary | ICD-10-CM

## 2017-09-25 DIAGNOSIS — K5909 Other constipation: Secondary | ICD-10-CM

## 2017-09-25 DIAGNOSIS — R42 Dizziness and giddiness: Secondary | ICD-10-CM | POA: Diagnosis not present

## 2017-09-25 DIAGNOSIS — M9903 Segmental and somatic dysfunction of lumbar region: Secondary | ICD-10-CM | POA: Diagnosis not present

## 2017-09-25 DIAGNOSIS — M9905 Segmental and somatic dysfunction of pelvic region: Secondary | ICD-10-CM | POA: Diagnosis not present

## 2017-09-25 DIAGNOSIS — R51 Headache: Secondary | ICD-10-CM | POA: Diagnosis not present

## 2017-09-25 DIAGNOSIS — M9902 Segmental and somatic dysfunction of thoracic region: Secondary | ICD-10-CM | POA: Diagnosis not present

## 2017-09-25 DIAGNOSIS — M62830 Muscle spasm of back: Secondary | ICD-10-CM | POA: Diagnosis not present

## 2017-09-25 DIAGNOSIS — R110 Nausea: Secondary | ICD-10-CM | POA: Diagnosis not present

## 2017-09-25 MED ORDER — SUPREP BOWEL PREP KIT 17.5-3.13-1.6 GM/177ML PO SOLN: 1.0000 | ORAL | 0 refills | Status: DC

## 2017-09-25 NOTE — Patient Instructions (Signed)
You have been scheduled for a colonoscopy. Please follow written instructions given to you at your visit today.  Please pick up your prep supplies at the pharmacy within the next 1-3 days. If you use inhalers (even only as needed), please bring them with you on the day of your procedure. Your physician has requested that you go to www.startemmi.com and enter the access code given to you at your visit today. This web site gives a general overview about your procedure. However, you should still follow specific instructions given to you by our office regarding your preparation for the procedure.  If you are age 63 or older, your body mass index should be between 23-30. Your Body mass index is 19.14 kg/m. If this is out of the aforementioned range listed, please consider follow up with your Primary Care Provider.  If you are age 27 or younger, your body mass index should be between 19-25. Your Body mass index is 19.14 kg/m. If this is out of the aformentioned range listed, please consider follow up with your Primary Care Provider.

## 2017-09-25 NOTE — Progress Notes (Signed)
Patient ID: Megan [name], female   DOB: [birth year], 62 y.o.   MRN: [id number] HPI: Megan [name] is a 62 year old female with a past medical history of chronic constipation/colonic inertia, family history of colon cancer in her mother, family history of colon polyps in her father, hypothyroidism, osteoporosis who is seen in consult at the request of Dr. Laurann Montana to establish care for chronic constipation and family history of colon cancer.  She is here alone today and was previously seen by Dr. Earle Gell prior to his retirement.  She reports that she has had lifelong constipation which also runs in her family.  She brought records dating back to the mid 1980s when she had a barium enema as well as recommendation for a sits marker study.  This barium enema revealed "elongated hypotonic and very redundant colon".  She reports initially she was treated with Dulcolax for many years and then for a while needed fleets enemas in order to have a bowel movement.  All seem to change when she was started on Prozac and after this her bowel movements became more regular.  She took Prozac for nearly 30 years.  Patient has still been an issue for her and she currently uses 800 mg of magnesium and 4000 mg of vitamin C daily.  She has been having about 1 bowel movement per day when things are good but can also go only 2 days/week.  She does occasionally feel incomplete evacuation and lower abdominal fullness.  She has not seen any blood in her stool or melena.  Her last colonoscopy was in July 2014 performed by Dr. Earle Gell.  This was normal.  The exam was to the cecum with good, 2-day, prep.  She reports that she does eat a very high fiber diet and gets 15-20 servings of vegetables per day.  She is also eating nuts, seeds and some animal protein.  In November she was started on oral iron for some hair loss and borderline iron studies.  This caused severe constipation and she could not tolerate the iron.  Since  this time her bowel habits have not returned to what she would consider her most recent normal.  She also stopped probiotic around this time.  She was taking Saccharomyces boulardii.  She denies upper GI complaint including heartburn, dysphagia and odynophagia.  Her weight has been stable.  She does see Dr. Mauro Kaufmann of the [facility] clinic but also private practice in [place].  She reports he is an expert in functional medicine and has treated her with multiple over-the-counter supplements and herbal remedies.  She states that she has had issues with probable Lyme disease and also Candida infections.  She is been treated in the past with prolonged nystatin therapy.  She does continue to take multiple vitamins and supplements.  Past Medical History:  Diagnosis Date  . Aneurysm of cardiac wall, congenital    a.  septal aneurysm extending into the RVOT (by cardiac MRI in 08/2013)  . Asthma   . Depression   . Hypothyroidism   . Lyme disease   . MVP (mitral valve prolapse)   . Orthostatic hypotension   . Osteoporosis   . Peripheral neuropathy 06/09/2012  . Plantar fasciitis   . Pneumothorax     Past Surgical History:  Procedure Laterality Date  . COLONOSCOPY WITH PROPOFOL N/A 02/22/2013   Procedure: COLONOSCOPY WITH PROPOFOL;  Surgeon: Garlan Fair, MD;  Location: [facility];  Service: Endoscopy;  Laterality: N/A;  . MOUTH SURGERY  01/2012   bone graft in mouth  . NASAL SINUS SURGERY Right 06/12/2014   Procedure: RIGHT ENDOSCOPIC MAXILLARY ANTROSTOMY;  Surgeon: Ascencion Dike, MD;  Location: [facility];  Service: ENT;  Laterality: Right;  . ROOT CANAL  02/27/2012  . TONSILLECTOMY  1962    Outpatient Medications Prior to Visit  Medication Sig Dispense Refill  . 5-Hydroxytryptophan (5-HTP) 50 MG CAPS Take 50 mg by mouth daily.    . Alpha-Lipoic Acid 300 MG TABS Take 600 mg by mouth daily.    Francia Greaves THYROID 30 MG tablet Take 30 mg by mouth daily.  6  . Ascorbic Acid  (VITAMIN C PO) Take 4,000 Units by mouth daily.    [name] BIOTIN PO Take 20,000 mcg by mouth.    . Cholecalciferol (VITAMIN D3) 10000 units capsule Take 10,000 Units by mouth daily.    . Coenzyme Q10 (CO Q 10) 100 MG CAPS Take 200 mg by mouth daily.    . Digestive Enzymes (DIGESTIVE ENZYME PO) Take 1 tablet by mouth daily.    [name] GLUTATHIONE PO Take 1 capsule by mouth daily.    [name] MAGNESIUM CITRATE PO Take 800 mg by mouth.     . Menaquinone-7 (VITAMIN K2 PO) Take 1 tablet by mouth daily.    . Misc Natural Products (CHLORELLA) 500 MG CAPS Take 500 mg by mouth daily.    . Multiple Vitamin (MULTIVITAMIN WITH MINERALS) TABS Take 1 tablet by mouth daily.    . NON FORMULARY Take 2 tablets by mouth daily. "NT Energy Factor"    . NON FORMULARY Take 3 tablets by mouth daily. "Energy Multi Flex"    . NON FORMULARY Take 1 tablet by mouth daily. "GI Benefits" supplement    . Omega-3 Fatty Acids (FISH OIL) 1000 MG CAPS Take 1,000 mg by mouth daily.    . Probiotic Product (PROBIOTIC DAILY PO) Take 1 capsule by mouth. 1 capsule of 5 billion colonies     No facility-administered medications prior to visit.     No Known Allergies  Family History  Problem Relation Age of Onset  . Hyperlipidemia Father   . Heart failure Father   . Prostate cancer Father   . Kidney failure Father   . Hypertension Father   . Angina Mother   . Hypertension Mother   . Lung cancer Mother   . Colon cancer Mother   . Lung cancer Paternal Grandfather   . Breast cancer Paternal Grandmother   . Diabetes Paternal Grandmother     Social History   Tobacco Use  . Smoking status: Never Smoker  . Smokeless tobacco: Never Used  Substance Use Topics  . Alcohol use: No    Alcohol/week: 0.0 oz  . Drug use: No    ROS: As per history of present illness, otherwise negative  BP 98/62   Pulse 66   Ht 5' 9.5" (1.765 m)   Wt 131 lb 8 oz (59.6 kg)   BMI 19.14 kg/m  Constitutional: Well-developed and well-nourished. No  distress. HEENT: Normocephalic and atraumatic. Oropharynx is clear and moist. Conjunctivae are normal.  No scleral icterus. Neck: Neck supple. Trachea midline. Cardiovascular: Normal rate, regular rhythm and intact distal pulses. No M/R/G Pulmonary/chest: Effort normal and breath sounds normal. No wheezing, rales or rhonchi. Abdominal: Soft, nontender, nondistended. Bowel sounds active throughout. There are no masses palpable. No hepatosplenomegaly. Extremities: no clubbing, cyanosis, or edema Neurological: Alert and oriented to person place and time. Skin: Skin is warm  and dry.  Psychiatric: Normal mood and affect. Behavior is normal.  RELEVANT LABS AND IMAGING: CBC    Component Value Date/Time   WBC 3.2 (L) 01/06/2017 1125   RBC 4.60 01/06/2017 1125   HGB 13.5 01/06/2017 1125   HGB 13.7 03/15/2013 1144   HCT 41.0 01/06/2017 1125   HCT 40.8 03/15/2013 1144   PLT 188 01/06/2017 1125   PLT 210 03/15/2013 1144   MCV 89.1 01/06/2017 1125   MCV 88.9 03/15/2013 1144   MCH 29.3 01/06/2017 1125   MCHC 32.9 01/06/2017 1125   RDW 12.4 01/06/2017 1125   RDW 13.8 03/15/2013 1144   LYMPHSABS 1.0 01/06/2017 1125   LYMPHSABS 1.0 03/15/2013 1144   MONOABS 0.3 01/06/2017 1125   MONOABS 0.3 03/15/2013 1144   EOSABS 0.1 01/06/2017 1125   EOSABS 0.0 03/15/2013 1144   BASOSABS 0.0 01/06/2017 1125   BASOSABS 0.1 03/15/2013 1144    CMP     Component Value Date/Time   NA 141 08/08/2016 0840   NA 140 05/24/2012 1053   K 4.4 08/08/2016 0840   K 4.1 05/24/2012 1053   CL 105 08/08/2016 0840   CL 106 05/24/2012 1053   CO2 29 08/08/2016 0840   CO2 27 05/24/2012 1053   GLUCOSE 95 08/08/2016 0840   GLUCOSE 72 05/24/2012 1053   BUN 10 08/08/2016 0840   BUN 13.0 05/24/2012 1053   CREATININE 0.70 08/08/2016 0840   CREATININE 0.63 03/05/2016 1159   CREATININE 0.7 05/24/2012 1053   CALCIUM 9.8 08/08/2016 0840   CALCIUM 9.5 05/24/2012 1053   PROT 6.6 08/08/2016 0840   PROT 6.6 05/24/2012 1053    ALBUMIN 4.2 08/08/2016 0840   ALBUMIN 4.0 05/24/2012 1053   AST 36 08/08/2016 0840   AST 29 05/24/2012 1053   ALT 18 08/08/2016 0840   ALT 13 05/24/2012 1053   ALKPHOS 83 08/08/2016 0840   ALKPHOS 65 05/24/2012 1053   BILITOT 0.8 08/08/2016 0840   BILITOT 0.50 05/24/2012 1053   GFRNONAA >60 08/08/2016 0840   GFRAA >60 08/08/2016 0840    ASSESSMENT/PLAN: 62 year old female with a past medical history of chronic constipation/colonic inertia, family history of colon cancer in her mother, family history of colon polyps in her father, hypothyroidism, osteoporosis who is seen in consult at the request of Dr. Laurann Montana to establish care for chronic constipation and family history of colon cancer.  1.  Chronic constipation/colonic inertia --it sounds like she had a abnormal Sitz marker study and certainly had a hypotonic and redundant colon by barium enema dating back to 73.  She has had successful incomplete colonoscopies and is currently up-to-date with that.  She will be due repeat colonoscopy in a few months for surveillance, see #2.  For now she is managing this issue with high fiber diet.  I have recommended that she resume probiotic with either Florastor 250 mg twice daily or VSL 3 1 capsule daily.  She would like to avoid prescription laxative if at all possible.  2.  Family history of colon cancer --she is due for screening colonoscopy due to family history of colon cancer in her mother and colon polyps in her father.  She will need a 2-day bowel preparation.  We discussed the risk, benefits and alternatives and she is agreeable and wishes to proceed.   HQ:IONGEXB, John, [place] [employer] [address] [place], [place] [address]

## 2017-09-28 DIAGNOSIS — M9903 Segmental and somatic dysfunction of lumbar region: Secondary | ICD-10-CM | POA: Diagnosis not present

## 2017-09-28 DIAGNOSIS — M9902 Segmental and somatic dysfunction of thoracic region: Secondary | ICD-10-CM | POA: Diagnosis not present

## 2017-09-28 DIAGNOSIS — M62830 Muscle spasm of back: Secondary | ICD-10-CM | POA: Diagnosis not present

## 2017-09-28 DIAGNOSIS — M9905 Segmental and somatic dysfunction of pelvic region: Secondary | ICD-10-CM | POA: Diagnosis not present

## 2017-09-29 ENCOUNTER — Ambulatory Visit: Payer: BLUE CROSS/BLUE SHIELD | Admitting: Physical Therapy

## 2017-10-01 DIAGNOSIS — F4322 Adjustment disorder with anxiety: Secondary | ICD-10-CM | POA: Diagnosis not present

## 2017-10-02 DIAGNOSIS — M62830 Muscle spasm of back: Secondary | ICD-10-CM | POA: Diagnosis not present

## 2017-10-02 DIAGNOSIS — M9903 Segmental and somatic dysfunction of lumbar region: Secondary | ICD-10-CM | POA: Diagnosis not present

## 2017-10-02 DIAGNOSIS — M9905 Segmental and somatic dysfunction of pelvic region: Secondary | ICD-10-CM | POA: Diagnosis not present

## 2017-10-02 DIAGNOSIS — M9902 Segmental and somatic dysfunction of thoracic region: Secondary | ICD-10-CM | POA: Diagnosis not present

## 2017-10-03 DIAGNOSIS — E559 Vitamin D deficiency, unspecified: Secondary | ICD-10-CM | POA: Diagnosis not present

## 2017-10-03 DIAGNOSIS — D72819 Decreased white blood cell count, unspecified: Secondary | ICD-10-CM | POA: Diagnosis not present

## 2017-10-03 DIAGNOSIS — R5383 Other fatigue: Secondary | ICD-10-CM | POA: Diagnosis not present

## 2017-10-05 ENCOUNTER — Encounter: Payer: Self-pay | Admitting: Physician Assistant

## 2017-10-05 ENCOUNTER — Ambulatory Visit: Payer: BLUE CROSS/BLUE SHIELD | Admitting: Physician Assistant

## 2017-10-05 VITALS — BP 100/60 | HR 56 | Ht 69.5 in | Wt 132.6 lb

## 2017-10-05 DIAGNOSIS — Q2549 Other congenital malformations of aorta: Secondary | ICD-10-CM

## 2017-10-05 DIAGNOSIS — R002 Palpitations: Secondary | ICD-10-CM | POA: Diagnosis not present

## 2017-10-05 DIAGNOSIS — Q2543 Congenital aneurysm of aorta: Secondary | ICD-10-CM | POA: Diagnosis not present

## 2017-10-05 DIAGNOSIS — R072 Precordial pain: Secondary | ICD-10-CM | POA: Diagnosis not present

## 2017-10-05 NOTE — Progress Notes (Signed)
Cardiology Office Note   Date:  10/05/2017   ID:  Megan [name], DOB 12-13-1955, MRN [id number]  PCP:  Lavone Orn, MD  Cardiologist: Dr. Stanford Breed, 08/01/2013 Bernerd Pho, PA-C, 01/22/2017 Rosaria Ferries, PA-C   Chief Complaint  Patient presents with  . Follow-up    CT Scan, SOB, chest tightness    History of Present Illness: Megan [name] is a 62 y.o. 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  She had seen a neurologist, workup, had been told to see a rheumatologist, referral made.  Symptoms are not felt cardiac, concern for orthostatic hypotension with systolic blood pressure 88, hydration and compression stockings encouraged.  Patient being evaluated for MS  Megan [name] presents for cardiology follow up and evaluation.   Her bone density had progressed, so she went to "[employer]" and exercised>>had severe MS pain and HA that continued to worsen. She went to a chiropractor who was concerned when sx did not improve>>head CT>>cerebellar volume loss seen>>chiropractor felt this was coming from straining with exertion. He was also concerned that the straining could hurt her heart>>cards appt made.   She gets pressure in her chest at times.  2-3 x week. 4/10. It can last 2-20 minutes. No relieving sx, starts w/ exertion and at rest. No association with meals. Not sure if stress is a contributor. No change w/ deep inspiration or position.  No chest wall tenderness.  She also gets a strange sensation in her chest at times, wonders if is a skipped beat. She will get a little light-headed and SOB. She feels like she cannot get  enough air.This can also happen with exertion or at rest. The sx can be very brief but can last up to 20 minutes. It can occur w/ the chest pain but not always.   Her thyroid is followed carefully, no recent dose change.   She had major dental surgery in December, she is having metals, mercury removed.    Past Medical History:  Diagnosis Date  . Aneurysm of cardiac wall, congenital    a.  septal aneurysm extending into the RVOT (by cardiac MRI in 08/2013)  . Asthma   . Depression   . Hypothyroidism   . Lyme disease   . MVP (mitral valve prolapse)   . Orthostatic hypotension   . Osteoporosis   . Peripheral neuropathy 06/09/2012  . Plantar fasciitis   . Pneumothorax     Past Surgical History:  Procedure Laterality Date  . COLONOSCOPY WITH PROPOFOL N/A 02/22/2013   Procedure: COLONOSCOPY WITH PROPOFOL;  Surgeon: Garlan Fair, MD;  Location: [facility];  Service: Endoscopy;  Laterality: N/A;  . MOUTH SURGERY  01/2012   bone graft in mouth  . NASAL SINUS SURGERY Right 06/12/2014   Procedure: RIGHT ENDOSCOPIC MAXILLARY ANTROSTOMY;  Surgeon: Ascencion Dike, MD;  Location: [facility];  Service: ENT;  Laterality: Right;  . ROOT CANAL  02/27/2012  . TONSILLECTOMY  1962    Current Outpatient Medications  Medication Sig Dispense Refill  . 5-Hydroxytryptophan (5-HTP) 50 MG CAPS Take 50 mg by mouth daily.    . Alpha-Lipoic Acid 300 MG TABS Take 600 mg by mouth daily.    [name]  ARMOUR THYROID 30 MG tablet Take 30 mg by mouth daily.  6  . Ascorbic Acid (VITAMIN C PO) Take 4,000 Units by mouth daily.    [name] BIOTIN PO Take 20,000 mcg by mouth.    . Cholecalciferol (VITAMIN D3) 10000 units capsule Take 10,000 Units by mouth daily.    . Coenzyme Q10 (CO Q 10) 100 MG CAPS Take 200 mg by mouth daily.    . Digestive Enzymes (DIGESTIVE ENZYME PO) Take 1 tablet by mouth daily.    [name] GLUTATHIONE PO Take 1 capsule by mouth daily.    [name] MAGNESIUM CITRATE PO Take 800 mg by mouth.     .  Menaquinone-7 (VITAMIN K2 PO) Take 1 tablet by mouth daily.    . Misc Natural Products (CHLORELLA) 500 MG CAPS Take 500 mg by mouth daily.    . Multiple Vitamin (MULTIVITAMIN WITH MINERALS) TABS Take 1 tablet by mouth daily.    . NON FORMULARY Take 2 tablets by mouth daily. "NT Energy Factor"    . NON FORMULARY Take 3 tablets by mouth daily. "Energy Multi Flex"    . NON FORMULARY Take 1 tablet by mouth daily. "GI Benefits" supplement    . Omega-3 Fatty Acids (FISH OIL) 1000 MG CAPS Take 1,000 mg by mouth daily.    . Probiotic Product (PROBIOTIC DAILY PO) Take 1 capsule by mouth. 1 capsule of 5 billion colonies    . SUPREP BOWEL PREP KIT 17.5-3.13-1.6 GM/177ML SOLN Take 1 kit by mouth as directed. 354 mL 0   No current facility-administered medications for this visit.     Allergies:   Patient has no known allergies.    Social History:  The patient  reports that  has never smoked. she has never used smokeless tobacco. She reports that she does not drink alcohol or use drugs.   Family History:  The patient's family history includes Angina in her mother; Breast cancer in her paternal grandmother; Colon cancer in her mother; Diabetes in her paternal grandmother; Heart failure in her father; Hyperlipidemia in her father; Hypertension in her father and mother; Kidney failure in her father; Lung cancer in her mother and paternal grandfather; Prostate cancer in her father.    ROS:  Please see the history of present illness. All other systems are reviewed and negative.    PHYSICAL EXAM: VS:  BP 100/60   Pulse (!) 56   Ht 5' 9.5" (1.765 m)   Wt 132 lb 9.6 oz (60.1 kg)   SpO2 99%   BMI 19.30 kg/m  , BMI Body mass index is 19.3 kg/m. GEN: Well nourished, well developed, female in no acute distress  HEENT: normal for age  Neck: no JVD, no carotid bruit, no masses Cardiac: RRR; 2/6 murmur, no rubs, or gallops Respiratory:  clear to auscultation bilaterally, normal work of breathing GI: soft,  nontender, nondistended, + BS MS: no deformity or atrophy; no edema; distal pulses are 2+ in all 4 extremities   Skin: warm and dry, no rash Neuro:  Strength and sensation are intact Psych: euthymic mood, full affect   EKG:  EKG is ordered today. The ekg ordered today demonstrates sinus bradycardia, heart rate 56, no acute ischemic changes and no Q waves, normal intervals  Echocardiogram: 01/06/2017 Study Conclusions  - Left ventricle: The cavity size was normal. Wall thickness was normal. Systolic function was normal. The estimated ejection fraction was in the range of 60% to 65%. Wall motion was normal; there were no regional  wall motion abnormalities. Left ventricular diastolic function parameters were normal. - Mitral valve: There was mild regurgitation.  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  Lower Extremity Dopplers: 01/16/2017   Recent Labs: 01/06/2017: Hemoglobin 13.5; Platelets 188    Lipid Panel    Component Value Date/Time   CHOL  07/19/2008 0625    160        ATP III CLASSIFICATION:  <200     mg/dL   Desirable  200-239  mg/dL   Borderline High  >=240    mg/dL   High   TRIG 61 07/19/2008 0625   HDL 37 (L) 07/19/2008 0625   CHOLHDL 4.3 07/19/2008 0625   VLDL 12 07/19/2008 0625   LDLCALC (H) 07/19/2008 0625    111        Total Cholesterol/HDL:CHD Risk Coronary Heart Disease Risk Table                     Men   Women  1/2 Average Risk   3.4   3.3     Wt Readings from Last 3 Encounters:  10/05/17 132 lb 9.6 oz (60.1 kg)  09/25/17 131 lb 8 oz (59.6 kg)  04/23/17 132 lb (59.9 kg)     Other studies Reviewed: Additional studies/ records that were reviewed today include: Office notes and testing.  ASSESSMENT AND PLAN:  1.  Chest pain: Her symptoms can occur with exertion, but are not consistent.  They can also start  at rest.  She has not taken any medications that made a difference, there is no association with meals, position, or deep inspiration.  She has multiple cardiac risk factors.  She prefers to avoid dye and radiation if possible.  Therefore, I will get a stress echocardiogram for evaluation.  2.  Palpitations: They seem to happen most often when she is exerting herself.  Hopefully she will have them during the stress test.  If not, discuss having her wear an event monitor at follow-up.  No beta-blocker because of resting bradycardia.   3.  Hypothyroid: She has been on other drugs in the past, but is currently on the Armour Thyroid.  She was previously on 40 mg a day, but felt jittery.  She cut the dose back to 30 mg a day and is done well since then.  That was several months ago.  Follow-up with PCP.  4.  Sinus of Valsalva aneurysm: She had an echocardiogram in May.  Based on her current activity level and physical exam, I have no evidence that her condition has changed.  I advised that she needed to have an echo every so often and she might also end up needing a cardiac MRI or CT.  She understands that periodic testing for this will be needed, but nothing further needs to be done right now.  She will follow-up with Dr. Stanford Breed.   Current medicines are reviewed at length with the patient today.  The patient does not have concerns regarding medicines.  The following changes have been made:  no change  Labs/ tests ordered today include:  No orders of the defined types were placed in this encounter.    Disposition:   FU with Dr. Stanford Breed  Signed, Rosaria Ferries, PA-C  10/05/2017 11:33 AM    [facility] Phone: [phone]; Fax: [phone]  This note was written  with the assistance of speech recognition software. Please excuse any transcriptional errors.

## 2017-10-05 NOTE — Patient Instructions (Signed)
Medication Instructions:   NO CHANGES  Testing/Procedures:  Your physician has requested that you have a stress echocardiogram. For further information please visit [email or web address]. Please follow instruction sheet as given.    Follow-Up:  Your physician recommends that you schedule a follow-up appointment NEXT AVAILABLE WITH DR Stanford Breed  If you need a refill on your cardiac medications before your next appointment, please call your pharmacy.  Exercise Stress Echocardiogram An exercise stress echocardiogram is a test that checks how well your heart is working. For this test, you will walk on a treadmill to make your heart beat faster. This test uses sound waves (ultrasound) and a computer to make pictures (images) of your heart. These pictures will be taken before you exercise and after you exercise. What happens before the procedure?  Follow instructions from your doctor about what you cannot eat or drink before the test.  Do not drink or eat anything that has caffeine in it. Stop having caffeine for 24 hours before the test.  Ask your doctor about changing or stopping your normal medicines. This is important if you take diabetes medicines or blood thinners. Ask your doctor if you should take your medicines with water before the test.  If you use an inhaler, bring it to the test.  Do not use any products that have nicotine or tobacco in them, such as cigarettes and e-cigarettes. Stop using them for 4 hours before the test. If you need help quitting, ask your doctor.  Wear comfortable shoes and clothing. What happens during the procedure?  You will be hooked up to a TV screen. Your doctor will watch the screen to see how fast your heart beats during the test.  Before you exercise, a computer will make a picture of your heart. To do this: ? A gel will be put on your chest. ? A wand will be moved over the gel. ? Sound waves from the wand will go to the computer to make the  picture.  Your will start walking on a treadmill. The treadmill will start at a slow speed. It will get faster a little bit at a time. When you walk faster, your heart will beat faster.  The treadmill will be stopped when your heart is working hard.  You will lie down right away so another picture of your heart can be taken.  The test will take 30-60 minutes. What happens after the procedure?  Your heart rate and blood pressure will be watched after the test.  If your doctor says that you can, you may: ? Eat what you usually eat. ? Do your normal activities. ? Take medicines like normal. Summary  An exercise stress echocardiogram is a test that checks how well your heart is working.  Follow instructions about what you cannot eat or drink before the test. Ask your doctor if you should take your normal medicines before the test.  Stop having caffeine for 24 hours before the test. Do not use anything with nicotine or tobacco in it for 4 hours before the test.  A computer will take a picture of your heart before you walk on a treadmill. It will take another picture when you are done walking.  Your heart rate and blood pressure will be watched after the test. This information is not intended to replace advice given to you by your health care provider. Make sure you discuss any questions you have with your health care provider. Document Released: 06/08/2009 Document Revised: 05/04/2016 Document Reviewed:  05/04/2016 Elsevier Interactive Patient Education  2017 [employer].

## 2017-10-06 ENCOUNTER — Ambulatory Visit: Payer: BLUE CROSS/BLUE SHIELD | Admitting: Physical Therapy

## 2017-10-06 DIAGNOSIS — M9903 Segmental and somatic dysfunction of lumbar region: Secondary | ICD-10-CM | POA: Diagnosis not present

## 2017-10-06 DIAGNOSIS — M62830 Muscle spasm of back: Secondary | ICD-10-CM | POA: Diagnosis not present

## 2017-10-06 DIAGNOSIS — M9905 Segmental and somatic dysfunction of pelvic region: Secondary | ICD-10-CM | POA: Diagnosis not present

## 2017-10-06 DIAGNOSIS — M9902 Segmental and somatic dysfunction of thoracic region: Secondary | ICD-10-CM | POA: Diagnosis not present

## 2017-10-07 DIAGNOSIS — F4322 Adjustment disorder with anxiety: Secondary | ICD-10-CM | POA: Diagnosis not present

## 2017-10-13 ENCOUNTER — Encounter: Payer: Self-pay | Admitting: Physical Therapy

## 2017-10-13 ENCOUNTER — Ambulatory Visit: Payer: BLUE CROSS/BLUE SHIELD | Attending: Internal Medicine | Admitting: Physical Therapy

## 2017-10-13 DIAGNOSIS — M546 Pain in thoracic spine: Secondary | ICD-10-CM

## 2017-10-13 DIAGNOSIS — M62830 Muscle spasm of back: Secondary | ICD-10-CM | POA: Diagnosis not present

## 2017-10-13 DIAGNOSIS — M545 Low back pain, unspecified: Secondary | ICD-10-CM

## 2017-10-13 DIAGNOSIS — M9902 Segmental and somatic dysfunction of thoracic region: Secondary | ICD-10-CM | POA: Diagnosis not present

## 2017-10-13 DIAGNOSIS — M9905 Segmental and somatic dysfunction of pelvic region: Secondary | ICD-10-CM | POA: Diagnosis not present

## 2017-10-13 DIAGNOSIS — M9903 Segmental and somatic dysfunction of lumbar region: Secondary | ICD-10-CM | POA: Diagnosis not present

## 2017-10-13 NOTE — Therapy (Signed)
[facility] [address] [place], [place], [address] Phone: [phone]   Fax:  [phone]  Physical Therapy Treatment/Discharge  Patient Details  Name: Megan [name] MRN: [id number] Date of Birth: [birth year] Referring Provider: Lavone Orn, MD    Encounter Date: 10/13/2017  PT End of Session - 10/13/17 1624    Visit Number  2    Number of Visits  5    Date for PT Re-Evaluation  10/22/17    Authorization Type  [employer]    Authorization Time Period  09/22/17 to 10/22/17    PT Start Time  1447    PT Stop Time  1528    PT Time Calculation (min)  41 min    Activity Tolerance  Patient tolerated treatment well;No increased pain    Behavior During Therapy  WFL for tasks assessed/performed       Past Medical History:  Diagnosis Date  . Aneurysm of cardiac wall, congenital    a.  septal aneurysm extending into the RVOT (by cardiac MRI in 08/2013)  . Asthma   . Depression   . Hypothyroidism   . Lyme disease   . MVP (mitral valve prolapse)   . Orthostatic hypotension   . Osteoporosis   . Peripheral neuropathy 06/09/2012  . Plantar fasciitis   . Pneumothorax     Past Surgical History:  Procedure Laterality Date  . COLONOSCOPY WITH PROPOFOL N/A 02/22/2013   Procedure: COLONOSCOPY WITH PROPOFOL;  Surgeon: Garlan Fair, MD;  Location: [facility];  Service: Endoscopy;  Laterality: N/A;  . MOUTH SURGERY  01/2012   bone graft in mouth  . NASAL SINUS SURGERY Right 06/12/2014   Procedure: RIGHT ENDOSCOPIC MAXILLARY ANTROSTOMY;  Surgeon: Ascencion Dike, MD;  Location: [facility];  Service: ENT;  Laterality: Right;  . ROOT CANAL  02/27/2012  . TONSILLECTOMY  1962    There were no vitals filed for this visit.  Subjective Assessment - 10/13/17 1449    Subjective  Pt reports that things are going great. She no longer has any back pain and is ready for this to be her last day. She has some questions  regarding safe exercise moving forward.     Pertinent History  osteoporosis, peripheral neuropathy    Patient Stated Goals  improve understanding of do's/dont's of osteoporosis and make sure she didn't injury anything    Currently in Pain?  No/denies                      [facility] Adult PT Treatment/Exercise - 10/13/17 0001      Exercises   Exercises  Lumbar      Lumbar Exercises: Seated   Other Seated Lumbar Exercises  B horizontal abduction with green TB x20 reps, D2 flexion with green TB x20 reps each      Lumbar Exercises: Supine   Dead Bug  10 reps    Dead Bug Limitations  x10 reps with 5 sec hold and 5 reps with yellow TB resistance for HEP demo       Lumbar Exercises: Quadruped   Opposite Arm/Leg Raise  Right arm/Left leg;Left arm/Right leg;10 reps    Other Quadruped Lumbar Exercises  quadruped opposite UE/LE raise with horizontal abduction x5 reps for HEP demo              PT Education - 10/13/17 1520    Education provided  Yes    Education Details  lifting  mechanics; benefits of resistance exercise and increased awareness of technique/form; positions to avoid in yoga that place her trunk in excessive flexion; core strengthening with progressions    Person(s) Educated  Patient    Methods  Explanation;Handout;Verbal cues    Comprehension  Verbalized understanding;Returned demonstration       PT Short Term Goals - 10/13/17 1625      PT SHORT TERM GOAL #1   Title  Pt will demo good understanding of movements to avoid during the day/with activity that will decrease risk of vertebral fracture.     Time  2    Period  Weeks    Status  Achieved        PT Long Term Goals - 10/13/17 1625      PT LONG TERM GOAL #1   Title  Pt will report atleast 60% improvement in back pain from the start of therapy, to allow her to resume regular workout routine without issue or difficulty.    Baseline  100% improved     Time  4    Period  Weeks    Status  Achieved       PT LONG TERM GOAL #2   Title  Pt will be able to lift atleast 20# box with proper mechanics and without cuing from the therapist, 2/3 trials, which will place her at a decreased risk of injury to the spine.     Baseline  initial cuing required, but pt demonstrates good understanding end of session    Time  4    Period  Weeks    Status  Achieved      PT LONG TERM GOAL #3   Title  Pt will verbalize atleast 5 precautions that patients with osteoporosis must take to decreas their risk of injury or fracture.     Time  4    Period  Weeks    Status  Achieved            Plan - 10/13/17 1624    Clinical Impression Statement  Pt was discharged this visit having met all of her established goals since the evaluation 3 weeks ago. She no longer reports back pain and feels comfortable with the topics discussed at her last visit. Pt is able to demonstrate proper mechanics with lifting after therapist provided verbal and visual cuing, and she was able to perform HEP additions without significant difficulty. Several topics were discussed, including safe use of weight lifting equipment and the benefits of resistance training throughout the week. Pt is agreeable with discharge and verbalized understanding of all areas discussed.     Rehab Potential  Excellent    PT Frequency  1x / week    PT Duration  4 weeks    PT Treatment/Interventions  ADLs/Self Care Home Management;Cryotherapy;Electrical Stimulation;Moist Heat;Therapeutic activities;Therapeutic exercise;Balance training;Patient/family education;Neuromuscular re-education;Dry needling;Passive range of motion    PT Next Visit Plan  d/c home     Consulted and Agree with Plan of Care  Patient       Patient will benefit from skilled therapeutic intervention in order to improve the following deficits and impairments:  Pain, Improper body mechanics, Increased muscle spasms  Visit Diagnosis: Pain in thoracic spine  Muscle spasm of back  Acute  bilateral low back pain without sciatica     Problem List Patient Active Problem List   Diagnosis Date Noted  . Hair loss 04/25/2017  . Great toe pain, left 04/10/2017  . Precordial chest pain 12/17/2016  .  Aneurysm of right ventricle of heart 12/17/2016  . Chronic fatigue 03/05/2016  . Neuropathy associated with MGUS ([place]) 12/26/2013  . Orthostatic hypotension 08/01/2013  . Mitral valve prolapse 08/01/2013  . Spider veins of limb 02/15/2013  . Leukopenia 05/13/2012  . Bilateral Dorsal Foot pain 04/23/2011  . Gait abnormality 04/23/2011  . Leg length inequality 04/23/2011  . ASTHMA 11/16/2008  . OSTEOPOROSIS 10/19/2008  . Nonspecific (abnormal) findings on radiological and other examination of body structure 07/24/2008  . ABNORMAL CHEST XRAY 07/24/2008    4:31 PM,10/13/17 Sherol Dade PT, DPT [facility] [address] [place], [place], [address] Phone: [phone]   Fax:  [phone]  Name: Coleen Cardiff MRN: [id number] Date of Birth: Aug 09, 1956

## 2017-10-13 NOTE — Patient Instructions (Signed)
Upper body: pushups, planks, etc; resistance: progress weights and reps as you typically would *form is the primary issue (avoiding rounded back and flexion positions. 2x/week   Low body: leg press machines: make sure back is flat and knees aren't too close to your chest. 2x/week     ELASTIC BAND BILATERAL HORIZONTAL ABDUCTION  While holding an elastic band with your elbows straight and in front of your body, pull your arms apart and towards the side.  x20 reps       ELASTIC BAND SHOULDER  DIAGONAL - FLEXION ABDUCTION - SELF FIX  Start by holding an elastic band down by your side to fixate it with your uninvolved arm. Next, using the involved arm, draw the other end of the band upwards and towards the opposite side as shown. x20 reps       Dead [place] on your back with knees bent, feet on floor, and hands at your sides.  Raise your legs to 90 degrees. Tighten the stomach and glute muscles.  Pick up the opposite arm and foot, bring the knee and hand together.Hold slightly. Slowly separate hand and knee until limbs are straight out. DO NOT allow back to arch. Then bring hand and knee back together above your stomach.    Maintain your balance and your back flat the entire time, DO NOT allow back to arch. only go as far up and down as you can while maintaining abdominal activation.   Work up to 20+ reps, then increased hold       QUADRUPED ALTERNATE ARM AND LEG - BIRD DOG  While in a crawling position, brace at your abdominals and then slowly lift a leg and opposite arm upwards.   Maintain a level and stable pelvis and spine the entire time.  x20 reps, try to hold for long periods of time.   [facility] [address] [place], [place] [address] Phone # [phone] Fax [phone]

## 2017-10-14 ENCOUNTER — Other Ambulatory Visit (HOSPITAL_COMMUNITY): Payer: BLUE CROSS/BLUE SHIELD

## 2017-10-14 ENCOUNTER — Telehealth (HOSPITAL_COMMUNITY): Payer: Self-pay | Admitting: *Deleted

## 2017-10-14 NOTE — Telephone Encounter (Signed)
Patient given detailed instructions per Stress Test Requisition Sheet for test on 10/19/17 at 7:30.Patient Notified to arrive 30 minutes early, and that it is imperative to arrive on time for appointment to keep from having the test rescheduled.  Patient verbalized understanding. Megan [name]

## 2017-10-19 DIAGNOSIS — R5383 Other fatigue: Secondary | ICD-10-CM | POA: Diagnosis not present

## 2017-10-19 DIAGNOSIS — M810 Age-related osteoporosis without current pathological fracture: Secondary | ICD-10-CM | POA: Diagnosis not present

## 2017-10-19 DIAGNOSIS — G609 Hereditary and idiopathic neuropathy, unspecified: Secondary | ICD-10-CM | POA: Diagnosis not present

## 2017-10-19 DIAGNOSIS — E039 Hypothyroidism, unspecified: Secondary | ICD-10-CM | POA: Diagnosis not present

## 2017-10-20 ENCOUNTER — Ambulatory Visit (HOSPITAL_COMMUNITY): Payer: BLUE CROSS/BLUE SHIELD | Attending: Cardiovascular Disease

## 2017-10-20 ENCOUNTER — Ambulatory Visit (HOSPITAL_COMMUNITY): Payer: BLUE CROSS/BLUE SHIELD

## 2017-10-20 DIAGNOSIS — E039 Hypothyroidism, unspecified: Secondary | ICD-10-CM | POA: Diagnosis not present

## 2017-10-20 DIAGNOSIS — J45909 Unspecified asthma, uncomplicated: Secondary | ICD-10-CM | POA: Insufficient documentation

## 2017-10-20 DIAGNOSIS — R072 Precordial pain: Secondary | ICD-10-CM | POA: Insufficient documentation

## 2017-10-21 DIAGNOSIS — F4322 Adjustment disorder with anxiety: Secondary | ICD-10-CM | POA: Diagnosis not present

## 2017-10-22 DIAGNOSIS — M9905 Segmental and somatic dysfunction of pelvic region: Secondary | ICD-10-CM | POA: Diagnosis not present

## 2017-10-22 DIAGNOSIS — M9902 Segmental and somatic dysfunction of thoracic region: Secondary | ICD-10-CM | POA: Diagnosis not present

## 2017-10-22 DIAGNOSIS — M62830 Muscle spasm of back: Secondary | ICD-10-CM | POA: Diagnosis not present

## 2017-10-22 DIAGNOSIS — M9903 Segmental and somatic dysfunction of lumbar region: Secondary | ICD-10-CM | POA: Diagnosis not present

## 2017-10-27 ENCOUNTER — Encounter: Payer: Self-pay | Admitting: Internal Medicine

## 2017-10-27 ENCOUNTER — Ambulatory Visit: Payer: BLUE CROSS/BLUE SHIELD | Admitting: Internal Medicine

## 2017-10-27 VITALS — BP 92/58 | HR 66 | Ht 69.5 in | Wt 134.2 lb

## 2017-10-27 DIAGNOSIS — E039 Hypothyroidism, unspecified: Secondary | ICD-10-CM | POA: Diagnosis not present

## 2017-10-27 DIAGNOSIS — M810 Age-related osteoporosis without current pathological fracture: Secondary | ICD-10-CM | POA: Diagnosis not present

## 2017-10-27 NOTE — Patient Instructions (Signed)
Please make sure you get 1000-1200 mg calcium from diet. If you do not, you need to supplement.  Please continue Armour 30 mg daily.  Stop Biotin x1 week, then come back for thyroid labs.  Take the thyroid hormone every day, with water, at least 30 minutes before breakfast, separated by at least 4 hours from: - acid reflux medications - calcium - iron - multivitamins  Please come back for a follow-up appointment in 1 year. Send me a message 1 month before your next appt to order the DXA scan.  How Can I Prevent Falls? Men and women with osteoporosis need to take care not to fall down. Falls can break bones. Some reasons people fall are: Poor vision  Poor balance  Certain diseases that affect how you walk  Some types of medicine, such as sleeping pills.  Some tips to help prevent falls outdoors are: Use a cane or walker  Wear rubber-soled shoes so you don't slip  Walk on grass when sidewalks are slippery  In winter, put salt or kitty litter on icy sidewalks.  Some ways to help prevent falls indoors are: Keep rooms free of clutter, especially on floors  Use plastic or carpet runners on slippery floors  Wear low-heeled shoes that provide good support  Do not walk in socks, stockings, or slippers  Be sure carpets and area rugs have skid-proof backs or are tacked to the floor  Be sure stairs are well lit and have rails on both sides  Put grab bars on bathroom walls near tub, shower, and toilet  Use a rubber bath mat in the shower or tub  Keep a flashlight next to your bed  Use a sturdy step stool with a handrail and wide steps  Add more lights in rooms (and night lights) Buy a cordless phone to keep with you so that you don't have to rush to the phone       when it rings and so that you can call for help if you fall.   (adapted from http://www.niams.NightlifePreviews.se)  Please check out the following book about best diet for bone  health:   Exercise for Strong Bones (from [employer]) There are two types of exercises that are important for building and maintaining bone density:  weight-bearing and muscle-strengthening exercises. Weight-bearing Exercises These exercises include activities that make you move against gravity while staying upright. Weight-bearing exercises can be high-impact or low-impact. High-impact weight-bearing exercises help build bones and keep them strong. If you have broken a bone due to osteoporosis or are at risk of breaking a bone, you may need to avoid high-impact exercises. If you're not sure, you should check with your healthcare provider. Examples of high-impact weight-bearing exercises are: . Dancing . Doing high-impact aerobics . Hiking . Jogging/running . Jumping Rope . Stair climbing . Tennis Low-impact weight-bearing exercises can also help keep bones strong and are a safe alternative if you cannot do high-impact exercises. Examples of low-impact weight-bearing exercises are: . Using elliptical training machines . Doing low-impact aerobics . Using stair-step machines . Fast walking on a treadmill or outside Muscle-Strengthening Exercises These exercises include activities where you move your body, a weight or some other resistance against gravity. They are also known as resistance exercises and include: . Lifting weights . Using elastic exercise bands . Using weight machines . Lifting your own body weight . Functional movements, such as standing and rising up on your toes Yoga and Pilates can also improve strength, balance  and flexibility. However, certain positions may not be safe for people with osteoporosis or those at increased risk of broken bones. For example, exercises that have you bend forward may increase the chance of breaking a bone in the spine. A physical therapist should be able to help you learn which exercises are safe and appropriate for  you. Non-Impact Exercises Non-impact exercises can help you to improve balance, posture and how well you move in everyday activities. These exercises can also help to increase muscle strength and decrease the risk of falls and broken bones. Some of these exercises include: . Balance exercises that strengthen your legs and test your balance, such as Tai Chi, can decrease your risk of falls. . Posture exercises that improve your posture and reduce rounded or "sloping" shoulders can help you decrease the chance of breaking a bone, especially in the spine. . Functional exercises that improve how well you move can help you with everyday activities and decrease your chance of falling and breaking a bone. For example, if you have trouble getting up from a chair or climbing stairs, you should do these activities as exercises. A physical therapist can teach you balance, posture and functional exercises. Starting a New Exercise Program If you haven't exercised regularly for a while, check with your healthcare provider before beginning a new exercise program-particularly if you have health problems such as heart disease, diabetes or high blood pressure. If you're at high risk of breaking a bone, you should work with a physical therapist to develop a safe exercise program. Once you have your healthcare provider's approval, start slowly. If you've already broken bones in the spine because of osteoporosis, be very careful to avoid activities that require reaching down, bending forward, rapid twisting motions, heavy lifting and those that increase your chance of a fall. As you get started, your muscles may feel sore for a day or two after you exercise. If soreness lasts longer, you may be working too hard and need to ease up. Exercises should be done in a pain-free range of motion. How Much Exercise Do You Need? Weight-bearing exercises 30 minutes on most days of the week. Do a 30-minutesession or multiple sessions  spread out throughout the day. The benefits to your bones are the same.   Muscle-strengthening exercises Two to three days per week. If you don't have much time for strengthening/resistance training, do small amounts at a time. You can do just one body part each day. For example do arms one day, legs the next and trunk the next. You can also spread these exercises out during your normal day.  Balance, posture and functional exercises Every day or as often as needed. You may want to focus on one area more than the others. If you have fallen or lose your balance, spend time doing balance exercises. If you are getting rounded shoulders, work more on posture exercises. If you have trouble climbing stairs or getting up from the couch, do more functional exercises. You can also perform these exercises at one time or spread them during your day. Work with a phyiscal therapist to learn the right exercises for you.

## 2017-10-27 NOTE — Progress Notes (Addendum)
Patient ID: Megan [name], female   DOB: [birth year], 62 y.o.   MRN: [id number]    HPI  Megan [name] is a 62 y.o.-year-old female, referred by her OB/GYN doctor, Dr. Ronita Hipps, for management of osteoporosis and hypothyroidism.  Pt was dx with osteoporosis in 2006, but she had lower BMD even before menopause in 2004..  I reviewed pt's DEXA scans: Date L1-L4 T score FN T score 33% distal Radius  08/13/2017 -3.3 (-4.4%*) RFN: -2.0 LFN: -1.8 n/a  08/10/2015 -3.0 RFN: -2.0 LFN: -2.2 n/a   Per records brought by patient: L1-L4:  06/2013: -3.5  10/2007: -3.1  07/2005: -2.5  05/2003: -2.2  RFN:  06/2013: -2.2  10/2007: -2.0  07/2005: -2.1  05/2003: -1.8  LFN:  06/2013: -2.2  10/2007: -2.1  07/2005: -2.2  05/2003: -1.9  Fractures: - 07/2016: R rib   No dizziness/vertigo/orthostasis/poor vision. No falls.  Previous OP treatments:  - Fosamax and Actonel - 2001-2004 (no help) - Estradiol 0.0125 mg + Progesterone - 2014-2016 (improvement in spine BMD)  No h/o vitamin D deficiency. Reviewed available vit D levels: 09/2017: Vitamin D 72 06/17/2016: Vitamin D 35 No results found for: VD25OH Pt is on calcium 500 mg x 1 daily; but she does take a vitamin D supplement - 20,000 units 5 days a week. Taking vitamin K2 - 90 mcg with it.  + spin class, yoga, walking 3 miles 5 days a week before she got sick in 2010 >> now spin class, yoga, 2.5 mi a week and just started lifting weights.  She went to [facility] >> got hurt >> HA - lasted 3 weeks.   She does not take high vitamin A doses, but takes a MVI with >500% recomm. Daily dose.   She did not take steroids other than short courses - 2x in her life.  In 11/2016, she ruled out for multiple myeloma by protein electrophoresis.  Menopause was at 62 y/o (2004).   Pt does have a FH of osteoporosis: M - had spinal fx's, father - Lupron.  She is seeing [facility] >> saw him 3x in the past >> now sees another  functional medicine dr. She was told she had mold in her house, low WBC, tested positive for heavy metals, iron is low. She feels much better than when she started seeing him.  She was suspected for MS >> unclear dx despite extensive investigation.  No h/o hyper/hypocalcemia or hyperparathyroidism. No h/o kidney stones. Lab Results  Component Value Date   CALCIUM 9.8 08/08/2016   CALCIUM 9.3 03/05/2016   CALCIUM 9.5 03/03/2016   CALCIUM 9.0 03/25/2015   CALCIUM 9.8 12/26/2013   CALCIUM 9.6 06/15/2013   CALCIUM 9.5 05/24/2012   CALCIUM 9.8 05/21/2012   CALCIUM 8.8 07/27/2010   CALCIUM 8.9 07/18/2008   No h/o CKD. Last BUN/Cr: Lab Results  Component Value Date   BUN 10 08/08/2016   CREATININE 0.70 08/08/2016   She also has a h/o hypothyroidism.   Her TFTs were not abnormal, but she had: cold intolerance, fatigue, constipation.  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 Noticed hair loss after the switch, no other sxs.   Reviewed TSH recent levels:  08/14/2017: TSH 1.780, fT4 1.08 - pt was on Biotin 10,000 mcg when labs drawn; Selenium and Urinary iodine normal Lab Results  Component Value Date   TSH 6.760  07/19/2008   Pt is on Armour 30 mg daily (equivalent of 50 mcg levothyroxine  daily), taken: - in am - fasting - no coffee - at least 30 min from b'fast - + Ca after dinner - no Fe - + MVI >4h later - no PPIs - she is on Biotin - 5000 mcg daily - last dose yesterday pm  She also takes multiple supplements, to include alpha lipoic acid, coenzyme Q 10, 5 HTP, vitamin B12, probiotic, magnesium, etc.   She was previously vegan, now started to eat seafood, eggs and was told to introduce more animal proteins.   ROS: Constitutional: no weight gain/loss, + fatigue, + subjective hypothermia Eyes: no blurry vision, no xerophthalmia ENT: no sore throat, no nodules palpated in throat, + dysphagia/no odynophagia, no  hoarseness Cardiovascular: + CP (had recent stress test)/no SOB/palpitations/+ leg swelling Respiratory: no cough/SOB Gastrointestinal: no N/V/D/+ C Musculoskeletal: + muscle/+ joint aches Skin: no rashes, + hair loss Neurological: no tremors/numbness/tingling/dizziness Psychiatric: + both: depression/anxiety + low libido  Past Medical History:  Diagnosis Date  . Aneurysm of cardiac wall, congenital    a.  septal aneurysm extending into the RVOT (by cardiac MRI in 08/2013)  . Asthma   . Depression   . Hypothyroidism   . Lyme disease   . MVP (mitral valve prolapse)   . Orthostatic hypotension   . Osteoporosis   . Peripheral neuropathy 06/09/2012  . Plantar fasciitis   . Pneumothorax    Past Surgical History:  Procedure Laterality Date  . COLONOSCOPY WITH PROPOFOL N/A 02/22/2013   Procedure: COLONOSCOPY WITH PROPOFOL;  Surgeon: Garlan Fair, MD;  Location: [facility];  Service: Endoscopy;  Laterality: N/A;  . MOUTH SURGERY  01/2012   bone graft in mouth  . NASAL SINUS SURGERY Right 06/12/2014   Procedure: RIGHT ENDOSCOPIC MAXILLARY ANTROSTOMY;  Surgeon: Ascencion Dike, MD;  Location: [place];  Service: ENT;  Laterality: Right;  . ROOT CANAL  02/27/2012  . TONSILLECTOMY  1962   Social History   Socioeconomic History  . Marital status: Divorced    Spouse name: Not on file  . Number of children: 0  . Years of education: Not on file  . Highest education level: Not on file  Social Needs  . Financial resource strain: Not on file  . Food insecurity - worry: Not on file  . Food insecurity - inability: Not on file  . Transportation needs - medical: Not on file  . Transportation needs - non-medical: Not on file  Occupational History    Comment: Career and life coach  Tobacco Use  . Smoking status: Never Smoker  . Smokeless tobacco: Never Used  Substance and Sexual Activity  . Alcohol use: No    Alcohol/week: 0.0 oz  . Drug use: No  . Sexual activity:  Not on file  Other Topics Concern  . Not on file  Social History Narrative   Patient is single and lives alone.   Patient is self-employed, career and life coaching.   Patient drinks two to four cups of caffeine daily.   Current Outpatient Medications on File Prior to Visit  Medication Sig Dispense Refill  . 5-Hydroxytryptophan (5-HTP) 50 MG CAPS Take 50 mg by mouth daily.    . Alpha-Lipoic Acid 300 MG TABS Take 600 mg by mouth daily.    Francia Greaves THYROID 30 MG tablet Take 30 mg by mouth daily.  6  . Ascorbic Acid (VITAMIN C PO) Take 4,000 Units by mouth daily.    [name] BIOTIN PO Take 20,000 mcg by mouth.    [name]  Cholecalciferol (VITAMIN D3) 10000 units capsule Take 10,000 Units by mouth daily.    . Coenzyme Q10 (CO Q 10) 100 MG CAPS Take 200 mg by mouth daily.    . Digestive Enzymes (DIGESTIVE ENZYME PO) Take 1 tablet by mouth daily.    [name] GLUTATHIONE PO Take 1 capsule by mouth daily.    [name] MAGNESIUM CITRATE PO Take 800 mg by mouth.     . Menaquinone-7 (VITAMIN K2 PO) Take 1 tablet by mouth daily.    . Misc Natural Products (CHLORELLA) 500 MG CAPS Take 500 mg by mouth daily.    . Multiple Vitamin (MULTIVITAMIN WITH MINERALS) TABS Take 1 tablet by mouth daily.    . NON FORMULARY Take 2 tablets by mouth daily. "NT Energy Factor"    . NON FORMULARY Take 3 tablets by mouth daily. "Energy Multi Flex"    . NON FORMULARY Take 1 tablet by mouth daily. "GI Benefits" supplement    . Omega-3 Fatty Acids (FISH OIL) 1000 MG CAPS Take 1,000 mg by mouth daily.    . Probiotic Product (PROBIOTIC DAILY PO) Take 1 capsule by mouth. 1 capsule of 5 billion colonies    . SUPREP BOWEL PREP KIT 17.5-3.13-1.6 GM/177ML SOLN Take 1 kit by mouth as directed. (Patient not taking: Reported on 10/27/2017) 354 mL 0   No current facility-administered medications on file prior to visit.    No Known Allergies Family History  Problem Relation Age of Onset  . Hyperlipidemia Father   . Heart failure Father   . Prostate cancer  Father   . Kidney failure Father   . Hypertension Father   . Angina Mother   . Hypertension Mother   . Lung cancer Mother   . Colon cancer Mother   . Lung cancer Paternal Grandfather   . Breast cancer Paternal Grandmother   . Diabetes Paternal Grandmother     PE: BP (!) 92/58   Pulse 66   Ht 5' 9.5" (1.765 m)   Wt 134 lb 3.2 oz (60.9 kg)   SpO2 97%   BMI 19.53 kg/m  Wt Readings from Last 3 Encounters:  10/27/17 134 lb 3.2 oz (60.9 kg)  10/05/17 132 lb 9.6 oz (60.1 kg)  09/25/17 131 lb 8 oz (59.6 kg)   Constitutional: normal weight, in NAD. No kyphosis. Eyes: PERRLA, EOMI, no exophthalmos ENT: moist mucous membranes, no thyromegaly, no cervical lymphadenopathy Cardiovascular: RRR, No MRG Respiratory: CTA B Gastrointestinal: abdomen soft, NT, ND, BS+ Musculoskeletal: no deformities, strength intact in all 4 Skin: moist, warm, no rashes Neurological: no tremor with outstretched hands, DTR normal in all 4  Assessment: 1. Osteoporosis  Plan: 1. Osteoporosis - likely postmenopausal, she has FH of OP; she was drinking a lot of coffee before, now stopped -she feels this could have contributed. - We reviewed her DEXA scan report together, and I explained that based on the T scores, she has an increased risk for fractures. T scores are better in the hips c/w lumbar spine.  - we reviewed her dietary and supplemental calcium and vitamin D intake.  She is getting a lot of both.  Her vitamin D level was recently normal, and she is also taking vitamin K2, which enhances the effect of her vitamin D.  However, I recommended to make sure she gets not much more than 1000-1200 mg of calcium daily preferentially from diet.  We discussed about calcium content of almond milk, which he likes to drink. - I also recommended that  she stopped the supplement with extra vitamin A, and I explained that excess vitamin A can lead to osteoporosis - discussed fall precautions   - given handout from  [place] [facility]: weight bearing exercises - advised to do this at least 5/7 days - she did go to [facility] - for skeletal loading, but pushed herself too hard >> developed mm spasms and a 3-week HA. - She does not drink or smoke. - we discussed about maintaining a good amount of protein in her diet. The recommended daily protein intake is ~0.8 g per kilogram per day.  I do not agree that adding animal protein will help her.  In fact, since it decreases the pH of her blood, it increases the calcium efflux from bone >> I suggested to stick with plant-based proteins.  For more information, I recommended the following book:  - I did recommend that we start the medicine to improve her bone density. We discussed about the different medication classes, benefits and side effects (including atypical fractures and ONJ - she is undergoing dental work. However, other than a low dose estradiol, she is not open to start antiresorptive or pro-bone formation meds.  I am not comfortable to prescribe this for her, since this is not common practice anymore, due to the multiple risks associated with using HRT beyond a period of 10 years after menopause. - She would like to try to adjust her diet and increase her resistance exercises and repeat her bone density scan in 1 year.  At that time, we can reevaluate her bone density and decide for or against medicine. - Will check the following labs in 1 week, after she stops biotin, which I explained can interfere with thyroid tests: Orders Placed This Encounter  Procedures  . TSH  . T4, free  . T3, free  . BASIC METABOLIC PANEL WITH GFR  - will check a new DEXA scan in 1 year.  I advised her to send me a message to order the test so we have it ready when she comes for the next visit - will see pt back in a year  2. Hypothyroidism - No clear diagnosis of hypothyroidism when she started  thyroid hormone replacement.  She was started on medication  based on symptoms.  Since her TFTs remained normal afterwards, we can continue with her low-dose supplementation.   - latest thyroid labs reviewed with pt >> normal but she was on biotin with the latest labs were drawn.  I advised her to come back for labs in 1 week after stopped biotin.  We will check a BMP at that time also - she continues on Armour 30 mg daily - pt feels good on this dose,   But still have some possible hypothyroid symptoms: Cold intolerance, fatigue, constipation, hair loss - we discussed about taking the thyroid hormone every day, with water, >30 minutes before breakfast, separated by >4 hours from acid reflux medications, calcium, iron, multivitamins. Pt. is taking it correctly.  - time spent with the patient: 1 hour, of which >50% was spent in obtaining information about her symptoms, reviewing her previous labs, evaluations, and treatments, counseling her about her conditions (please see the discussed topics above), and developing a plan to further investigate and treat them; she had a number of questions which I addressed.  Component     Latest Ref Rng & Units 11/04/2017  Triiodothyronine,Free,Serum     2.3 - 4.2 pg/mL 5.2 (H)  T4,Free(Direct)  0.60 - 1.60 ng/dL 0.59 (L)  TSH     0.35 - 4.50 uIU/mL 2.74   TSH normal, slightly abnormal free T3 and free T4.  The slight free hormone fluctuation is not unusual in the setting of Armour treatment.    Chemistry      Component Value Date/Time   NA 140 11/04/2017 0918   NA 140 05/24/2012 1053   K 4.7 11/04/2017 0918   K 4.1 05/24/2012 1053   CL 107 11/04/2017 0918   CL 106 05/24/2012 1053   CO2 29 11/04/2017 0918   CO2 27 05/24/2012 1053   BUN 9 11/04/2017 0918   BUN 13.0 05/24/2012 1053   CREATININE 0.60 11/04/2017 0918   CREATININE 0.7 05/24/2012 1053      Component Value Date/Time   CALCIUM 9.5 11/04/2017 0918   CALCIUM 9.5 05/24/2012 1053   ALKPHOS 83 08/08/2016 0840   ALKPHOS 65 05/24/2012 1053   AST 36  08/08/2016 0840   AST 29 05/24/2012 1053   ALT 18 08/08/2016 0840   ALT 13 05/24/2012 1053   BILITOT 0.8 08/08/2016 0840   BILITOT 0.50 05/24/2012 1053     The basic metabolic panel is normal, too.    CC: Dr. August Luz [facility] [place] [address]  Megan Kingdom, MD PhD [facility] Endocrinology

## 2017-10-28 DIAGNOSIS — F4322 Adjustment disorder with anxiety: Secondary | ICD-10-CM | POA: Diagnosis not present

## 2017-11-04 ENCOUNTER — Other Ambulatory Visit (INDEPENDENT_AMBULATORY_CARE_PROVIDER_SITE_OTHER): Payer: BLUE CROSS/BLUE SHIELD

## 2017-11-04 DIAGNOSIS — M810 Age-related osteoporosis without current pathological fracture: Secondary | ICD-10-CM

## 2017-11-04 DIAGNOSIS — E039 Hypothyroidism, unspecified: Secondary | ICD-10-CM

## 2017-11-04 LAB — BASIC METABOLIC PANEL WITH GFR
BUN: 9 mg/dL (ref 7–25)
CO2: 29 mmol/L (ref 20–32)
Calcium: 9.5 mg/dL (ref 8.6–10.4)
Chloride: 107 mmol/L (ref 98–110)
Creat: 0.6 mg/dL (ref 0.50–0.99)
GFR, Est African American: 113 mL/min/{1.73_m2} (ref >=60)
GFR, Est Non African American: 98 mL/min/{1.73_m2} (ref >=60)
Glucose, Bld: 90 mg/dL (ref 65–99)
Potassium: 4.7 mmol/L (ref 3.5–5.3)
Sodium: 140 mmol/L (ref 135–146)

## 2017-11-04 LAB — TSH: TSH: 2.74 u[IU]/mL (ref 0.35–4.50)

## 2017-11-04 LAB — T3, FREE: T3, Free: 5.2 pg/mL — ABNORMAL HIGH (ref 2.3–4.2)

## 2017-11-04 LAB — T4, FREE: Free T4: 0.59 ng/dL — ABNORMAL LOW (ref 0.60–1.60)

## 2017-11-05 DIAGNOSIS — F4322 Adjustment disorder with anxiety: Secondary | ICD-10-CM | POA: Diagnosis not present

## 2017-11-09 ENCOUNTER — Encounter: Payer: Self-pay | Admitting: Family Medicine

## 2017-11-09 ENCOUNTER — Ambulatory Visit: Payer: BLUE CROSS/BLUE SHIELD | Admitting: Family Medicine

## 2017-11-09 DIAGNOSIS — M25572 Pain in left ankle and joints of left foot: Secondary | ICD-10-CM

## 2017-11-09 DIAGNOSIS — M25571 Pain in right ankle and joints of right foot: Secondary | ICD-10-CM

## 2017-11-09 NOTE — Patient Instructions (Signed)
You have sinus tarsi syndrome and ankle instability. Ice the area for 15 minutes at a time, 3-4 times a day Consider Aleve 2 tabs twice a day with food OR ibuprofen 3 tabs three times a day with food for pain and inflammation for 7-10 days. Topical aspercreme or the other one with salicylate in it up to 4 times a day. Elevate above the level of your heart when possible Try the sleeve again for compression. When tolerated start the theraband strengthening exercises up to 3 sets of 10 once a day. Arch supports are important - something like spencos, dr. Zoe Lan active series, superfeet. Use pain as your guide for activities - ok to start with swimming, low resistance cycling now if these don't worsen your pain. Follow up in 3 weeks.

## 2017-11-09 NOTE — Assessment & Plan Note (Signed)
Primarily consistent with sinus tarsi syndrome and underlying ankle instability with marked increase in her activity level causing flares of these.  While she does have some focal tenosynovitis of medial lateral ankle tendons feel these are more secondary to these main issues.  Encouraged icing, regular anti-inflammatories for 7-10 days either Aleve or ibuprofen.  Topical Aspercreme up to 4 times a day.  Elevation, try compression again.  Reviewed home exercise program for her to do daily when tolerated.  Stressed importance of arch support as well.  Using pain to guide activities.  Follow-up in 3 weeks.

## 2017-11-09 NOTE — Progress Notes (Signed)
PCP: Lavone Orn, MD  Subjective:   HPI: Patient is a 62 y.o. female here for bilateral ankle pain.  Patient reports for about 3 weeks she has had bilateral right greater than left ankle pain and swelling. She states she was recently diagnosed with osteoporosis and because she does not want to take any the medications for this she was advised to start doing some high impact activities that include jump roping and running. Because of her neuropathy in bilateral lower extremities she cannot wear sneakers and usually wears clogs or sandals. She started first with jump roping about 30 times and felt good so started to do 150 jumps for the subsequent 5 days. And she is a walk jog for 2 days in a row. During this time she developed worsening pain as noted previously. She has totally rested for the last 10 days and still feels a 5 out of 10 level pain of her right ankle mostly laterally as well as a 2 out of 10 pain on the left ankle. She has been elevating, icing, using Advil twice a day and topical hemp oil. No new numbness and no skin changes.  Past Medical History:  Diagnosis Date  . Aneurysm of cardiac wall, congenital    a.  septal aneurysm extending into the RVOT (by cardiac MRI in 08/2013)  . Asthma   . Depression   . Hypothyroidism   . Lyme disease   . MVP (mitral valve prolapse)   . Orthostatic hypotension   . Osteoporosis   . Peripheral neuropathy 06/09/2012  . Plantar fasciitis   . Pneumothorax     Current Outpatient Medications on File Prior to Visit  Medication Sig Dispense Refill  . 5-Hydroxytryptophan (5-HTP) 50 MG CAPS Take 50 mg by mouth daily.    . Alpha-Lipoic Acid 300 MG TABS Take 600 mg by mouth daily.    Francia Greaves THYROID 30 MG tablet Take 30 mg by mouth daily.  6  . Ascorbic Acid (VITAMIN C PO) Take 4,000 Units by mouth daily.    [name] BIOTIN PO Take 20,000 mcg by mouth.    . Cholecalciferol (VITAMIN D3) 10000 units capsule Take 10,000 Units by mouth daily.    .  Coenzyme Q10 (CO Q 10) 100 MG CAPS Take 200 mg by mouth daily.    . Digestive Enzymes (DIGESTIVE ENZYME PO) Take 1 tablet by mouth daily.    [name] GLUTATHIONE PO Take 1 capsule by mouth daily.    [name] MAGNESIUM CITRATE PO Take 800 mg by mouth.     . Menaquinone-7 (VITAMIN K2 PO) Take 1 tablet by mouth daily.    . Misc Natural Products (CHLORELLA) 500 MG CAPS Take 500 mg by mouth daily.    . Multiple Vitamin (MULTIVITAMIN WITH MINERALS) TABS Take 1 tablet by mouth daily.    . NON FORMULARY Take 2 tablets by mouth daily. "NT Energy Factor"    . NON FORMULARY Take 3 tablets by mouth daily. "Energy Multi Flex"    . NON FORMULARY Take 1 tablet by mouth daily. "GI Benefits" supplement    . Omega-3 Fatty Acids (FISH OIL) 1000 MG CAPS Take 1,000 mg by mouth daily.    . Probiotic Product (PROBIOTIC DAILY PO) Take 1 capsule by mouth. 1 capsule of 5 billion colonies    . SUPREP BOWEL PREP KIT 17.5-3.13-1.6 GM/177ML SOLN Take 1 kit by mouth as directed. (Patient not taking: Reported on 10/27/2017) 354 mL 0   No current facility-administered medications on file  prior to visit.     Past Surgical History:  Procedure Laterality Date  . COLONOSCOPY WITH PROPOFOL N/A 02/22/2013   Procedure: COLONOSCOPY WITH PROPOFOL;  Surgeon: Garlan Fair, MD;  Location: [facility];  Service: Endoscopy;  Laterality: N/A;  . MOUTH SURGERY  01/2012   bone graft in mouth  . NASAL SINUS SURGERY Right 06/12/2014   Procedure: RIGHT ENDOSCOPIC MAXILLARY ANTROSTOMY;  Surgeon: Ascencion Dike, MD;  Location: [place];  Service: ENT;  Laterality: Right;  . ROOT CANAL  02/27/2012  . TONSILLECTOMY  1962    No Known Allergies  Social History   Socioeconomic History  . Marital status: Divorced    Spouse name: Not on file  . Number of children: 0  . Years of education: Not on file  . Highest education level: Not on file  Social Needs  . Financial resource strain: Not on file  . Food insecurity - worry: Not on file   . Food insecurity - inability: Not on file  . Transportation needs - medical: Not on file  . Transportation needs - non-medical: Not on file  Occupational History    Comment: Career and life coach  Tobacco Use  . Smoking status: Never Smoker  . Smokeless tobacco: Never Used  Substance and Sexual Activity  . Alcohol use: No    Alcohol/week: 0.0 oz  . Drug use: No  . Sexual activity: Not on file  Other Topics Concern  . Not on file  Social History Narrative   Patient is single and lives alone.   Patient is self-employed, career and life coaching.   Patient drinks two to four cups of caffeine daily.    Family History  Problem Relation Age of Onset  . Hyperlipidemia Father   . Heart failure Father   . Prostate cancer Father   . Kidney failure Father   . Hypertension Father   . Angina Mother   . Hypertension Mother   . Lung cancer Mother   . Colon cancer Mother   . Lung cancer Paternal Grandfather   . Breast cancer Paternal Grandmother   . Diabetes Paternal Grandmother     BP (!) 95/57   Pulse 70   Ht '5\' 10"'  (1.778 m)   Wt 132 lb (59.9 kg)   BMI 18.94 kg/m   Review of Systems: See HPI above.     Objective:  Physical Exam:  Gen: NAD, comfortable in exam room  Right ankle: No gross deformity, swelling, ecchymoses FROM with pain on external rotation. TTP greatest over sinus tarsi, less over posterior tibialis tendon. 2+ ant drawer and trace talar tilt.   Negative syndesmotic compression. Thompsons test negative. NV intact distally.  Left ankle: Mild pronation.  No deformity. FROM with 5/5 strength. No tenderness to palpation. 2+ ant drawer, trace talar tilt. NVI distally.  MSK u/s:  Small subtalar effusion and swelling in sinus tarsi.  Focal tenosynovitis of posterior tibialis tendon and peroneus brevis.  Also focal tear in peroneus brevis involving <10% of the tendon.   Assessment & Plan:  1.  Bilateral ankle pain: Primarily consistent with sinus  tarsi syndrome and underlying ankle instability with marked increase in her activity level causing flares of these.  While she does have some focal tenosynovitis of medial lateral ankle tendons feel these are more secondary to these main issues.  Encouraged icing, regular anti-inflammatories for 7-10 days either Aleve or ibuprofen.  Topical Aspercreme up to 4 times a day.  Elevation,  try compression again.  Reviewed home exercise program for her to do daily when tolerated.  Stressed importance of arch support as well.  Using pain to guide activities.  Follow-up in 3 weeks.

## 2017-11-10 DIAGNOSIS — M810 Age-related osteoporosis without current pathological fracture: Secondary | ICD-10-CM | POA: Diagnosis not present

## 2017-11-10 DIAGNOSIS — E559 Vitamin D deficiency, unspecified: Secondary | ICD-10-CM | POA: Diagnosis not present

## 2017-11-11 DIAGNOSIS — F4322 Adjustment disorder with anxiety: Secondary | ICD-10-CM | POA: Diagnosis not present

## 2017-11-12 DIAGNOSIS — M62830 Muscle spasm of back: Secondary | ICD-10-CM | POA: Diagnosis not present

## 2017-11-12 DIAGNOSIS — M9902 Segmental and somatic dysfunction of thoracic region: Secondary | ICD-10-CM | POA: Diagnosis not present

## 2017-11-12 DIAGNOSIS — M9905 Segmental and somatic dysfunction of pelvic region: Secondary | ICD-10-CM | POA: Diagnosis not present

## 2017-11-12 DIAGNOSIS — M9903 Segmental and somatic dysfunction of lumbar region: Secondary | ICD-10-CM | POA: Diagnosis not present

## 2017-11-12 DIAGNOSIS — M810 Age-related osteoporosis without current pathological fracture: Secondary | ICD-10-CM | POA: Diagnosis not present

## 2017-11-13 DIAGNOSIS — G501 Atypical facial pain: Secondary | ICD-10-CM | POA: Diagnosis not present

## 2017-11-13 DIAGNOSIS — H903 Sensorineural hearing loss, bilateral: Secondary | ICD-10-CM | POA: Diagnosis not present

## 2017-11-13 DIAGNOSIS — H9209 Otalgia, unspecified ear: Secondary | ICD-10-CM | POA: Diagnosis not present

## 2017-11-17 DIAGNOSIS — M62830 Muscle spasm of back: Secondary | ICD-10-CM | POA: Diagnosis not present

## 2017-11-17 DIAGNOSIS — M9905 Segmental and somatic dysfunction of pelvic region: Secondary | ICD-10-CM | POA: Diagnosis not present

## 2017-11-17 DIAGNOSIS — M9903 Segmental and somatic dysfunction of lumbar region: Secondary | ICD-10-CM | POA: Diagnosis not present

## 2017-11-17 DIAGNOSIS — M9902 Segmental and somatic dysfunction of thoracic region: Secondary | ICD-10-CM | POA: Diagnosis not present

## 2017-11-19 ENCOUNTER — Other Ambulatory Visit (INDEPENDENT_AMBULATORY_CARE_PROVIDER_SITE_OTHER): Payer: Self-pay | Admitting: Otolaryngology

## 2017-11-19 DIAGNOSIS — F4322 Adjustment disorder with anxiety: Secondary | ICD-10-CM | POA: Diagnosis not present

## 2017-11-19 DIAGNOSIS — R6884 Jaw pain: Secondary | ICD-10-CM

## 2017-11-23 DIAGNOSIS — Z0000 Encounter for general adult medical examination without abnormal findings: Secondary | ICD-10-CM | POA: Diagnosis not present

## 2017-11-23 DIAGNOSIS — M810 Age-related osteoporosis without current pathological fracture: Secondary | ICD-10-CM | POA: Diagnosis not present

## 2017-11-23 DIAGNOSIS — E7800 Pure hypercholesterolemia, unspecified: Secondary | ICD-10-CM | POA: Diagnosis not present

## 2017-11-23 DIAGNOSIS — D72819 Decreased white blood cell count, unspecified: Secondary | ICD-10-CM | POA: Diagnosis not present

## 2017-11-23 DIAGNOSIS — I341 Nonrheumatic mitral (valve) prolapse: Secondary | ICD-10-CM | POA: Diagnosis not present

## 2017-11-23 DIAGNOSIS — E039 Hypothyroidism, unspecified: Secondary | ICD-10-CM | POA: Diagnosis not present

## 2017-11-25 DIAGNOSIS — Z85828 Personal history of other malignant neoplasm of skin: Secondary | ICD-10-CM | POA: Diagnosis not present

## 2017-11-25 DIAGNOSIS — L821 Other seborrheic keratosis: Secondary | ICD-10-CM | POA: Diagnosis not present

## 2017-11-25 DIAGNOSIS — L570 Actinic keratosis: Secondary | ICD-10-CM | POA: Diagnosis not present

## 2017-11-25 DIAGNOSIS — L814 Other melanin hyperpigmentation: Secondary | ICD-10-CM | POA: Diagnosis not present

## 2017-11-25 DIAGNOSIS — D225 Melanocytic nevi of trunk: Secondary | ICD-10-CM | POA: Diagnosis not present

## 2017-11-26 DIAGNOSIS — F4322 Adjustment disorder with anxiety: Secondary | ICD-10-CM | POA: Diagnosis not present

## 2017-11-27 ENCOUNTER — Other Ambulatory Visit: Payer: BLUE CROSS/BLUE SHIELD

## 2017-11-30 ENCOUNTER — Ambulatory Visit: Payer: BLUE CROSS/BLUE SHIELD | Admitting: Family Medicine

## 2017-12-01 ENCOUNTER — Ambulatory Visit
Admission: RE | Admit: 2017-12-01 | Discharge: 2017-12-01 | Disposition: A | Discharge status: Home / Self Care | Payer: BLUE CROSS/BLUE SHIELD | Source: Ambulatory Visit | Attending: Otolaryngology | Admitting: Otolaryngology

## 2017-12-01 DIAGNOSIS — F4322 Adjustment disorder with anxiety: Secondary | ICD-10-CM | POA: Diagnosis not present

## 2017-12-01 DIAGNOSIS — H93A2 Pulsatile tinnitus, left ear: Secondary | ICD-10-CM | POA: Diagnosis not present

## 2017-12-01 DIAGNOSIS — R6884 Jaw pain: Secondary | ICD-10-CM

## 2017-12-03 ENCOUNTER — Ambulatory Visit: Payer: BLUE CROSS/BLUE SHIELD | Admitting: Physician Assistant

## 2017-12-03 ENCOUNTER — Other Ambulatory Visit: Payer: Self-pay | Admitting: Physician Assistant

## 2017-12-03 ENCOUNTER — Telehealth: Payer: Self-pay | Admitting: Cardiology

## 2017-12-03 ENCOUNTER — Encounter: Payer: Self-pay | Admitting: Physician Assistant

## 2017-12-03 VITALS — BP 120/64 | HR 62 | Ht 69.5 in | Wt 133.0 lb

## 2017-12-03 DIAGNOSIS — R079 Chest pain, unspecified: Secondary | ICD-10-CM

## 2017-12-03 DIAGNOSIS — E7849 Other hyperlipidemia: Secondary | ICD-10-CM

## 2017-12-03 DIAGNOSIS — Q2549 Other congenital malformations of aorta: Secondary | ICD-10-CM | POA: Diagnosis not present

## 2017-12-03 MED ORDER — METOPROLOL TARTRATE 25 MG PO TABS: ORAL_TABLET | ORAL | 0 refills | Status: DC

## 2017-12-03 NOTE — Progress Notes (Signed)
Cardiology Office Note   Date:  12/03/2017   ID:  Megan ALLOCCA, DOB 11-17-1955, MRN [id number]  PCP:  Lavone Orn, MD  Cardiologist:  Dr Stanford Breed 08/01/2013  Megan Ferries, PA-C 10/05/2017  Chief Complaint  Patient presents with  . Follow-up  . Chest Pain  . Shortness of Breath    History of Present Illness: Megan [name] is a 62 y.o. 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  She has been having upper L chest pain and lower L chest pain under her breast. She describes aching, tightness and pressure. 5/10 at its worst. It is the same as what she had 09/2017 office visit. The episodes are coming more frequently, about half of them with exertion. She has had 6 episodes this week. She does not get sx every time she exercises. Sx resolve with time, from 2-15 minutes. During a recent spin class, the sx continued for 30 min, she got off the bike and then they resolved. Says no change w/ position change or deep inspiration.   She has been having a pulsing feeling in her L ear at times, her ears are fine.   She recently had her cholesterol checked, total was 268, both LDL and HDL are elevated. She was taking a lot of coconut oil, will cut back.  When her lipids were done, her triglycerides were low at 55.  She had not been taking omega-3 pills but has started taking them.  She is happy to learn that she did well on her stress test.  She had some testing done that showed she spends too much time in a "fight or flight".  She denies any particular feeling of anxiety.   Past Medical History:    Diagnosis Date  . Aneurysm of cardiac wall, congenital    a.  septal aneurysm extending into the RVOT (by cardiac MRI in 08/2013)  . Asthma   . Depression   . Hypothyroidism   . Lyme disease   . MVP (mitral valve prolapse)   . Orthostatic hypotension   . Osteoporosis   . Peripheral neuropathy 06/09/2012  . Plantar fasciitis   . Pneumothorax     Past Surgical History:  Procedure Laterality Date  . COLONOSCOPY WITH PROPOFOL N/A 02/22/2013   Procedure: COLONOSCOPY WITH PROPOFOL;  Surgeon: Garlan Fair, MD;  Location: [facility];  Service: Endoscopy;  Laterality: N/A;  . MOUTH SURGERY  01/2012   bone graft in mouth  . NASAL SINUS SURGERY Right 06/12/2014   Procedure: RIGHT ENDOSCOPIC MAXILLARY ANTROSTOMY;  Surgeon: Ascencion Dike, MD;  Location: [place];  Service: ENT;  Laterality: Right;  . ROOT CANAL  02/27/2012  . TONSILLECTOMY  1962    Current Outpatient Medications  Medication Sig Dispense Refill  . 5-Hydroxytryptophan (5-HTP) 50 MG CAPS Take 50 mg by mouth daily.    . Alpha-Lipoic Acid 300 MG TABS Take 600 mg by mouth daily.    Francia Greaves THYROID 30 MG tablet Take 30 mg by mouth daily.  6  . Ascorbic Acid (VITAMIN C PO) Take 4,000 Units by mouth daily.    [name]  BIOTIN PO Take 20,000 mcg by mouth.    . Cholecalciferol (VITAMIN D3) 10000 units capsule Take 10,000 Units by mouth daily.    . Coenzyme Q10 (CO Q 10) 100 MG CAPS Take 200 mg by mouth daily.    . Digestive Enzymes (DIGESTIVE ENZYME PO) Take 1 tablet by mouth daily.    [name] GLUTATHIONE PO Take 1 capsule by mouth daily.    [name] MAGNESIUM CITRATE PO Take 800 mg by mouth.     . Menaquinone-7 (VITAMIN K2 PO) Take 1 tablet by mouth daily.    . Misc Natural Products (CHLORELLA) 500 MG CAPS Take 500 mg by mouth daily.    . Multiple Vitamin (MULTIVITAMIN WITH MINERALS) TABS Take 1 tablet by mouth daily.    . NON FORMULARY Take 2 tablets by mouth daily. "NT Energy Factor"    . NON FORMULARY Take 3 tablets by mouth  daily. "Energy Multi Flex"    . NON FORMULARY Take 1 tablet by mouth daily. "GI Benefits" supplement    . Omega-3 Fatty Acids (FISH OIL) 1000 MG CAPS Take 1,000 mg by mouth daily.    . Probiotic Product (PROBIOTIC DAILY PO) Take 1 capsule by mouth. 1 capsule of 5 billion colonies    . SUPREP BOWEL PREP KIT 17.5-3.13-1.6 GM/177ML SOLN Take 1 kit by mouth as directed. 354 mL 0   No current facility-administered medications for this visit.     Allergies:   Patient has no known allergies.    Social History:  The patient  reports that she has never smoked. She has never used smokeless tobacco. She reports that she does not drink alcohol or use drugs.   Family History:  The patient's family history includes Angina in her mother; Breast cancer in her paternal grandmother; Colon cancer in her mother; Diabetes in her paternal grandmother; Heart failure in her father; Hyperlipidemia in her father; Hypertension in her father and mother; Kidney failure in her father; Lung cancer in her mother and paternal grandfather; Prostate cancer in her father.    ROS:  Please see the history of present illness. All other systems are reviewed and negative.    PHYSICAL EXAM: VS:  BP 120/64 (BP Location: Left Arm, Patient Position: Sitting, Cuff Size: Normal)   Pulse 62   Ht 5' 9.5" (1.765 m)   Wt 133 lb (60.3 kg)   BMI 19.36 kg/m  , BMI Body mass index is 19.36 kg/m. GEN: Well nourished, well developed, female in no acute distress  HEENT: normal for age  Neck: no JVD, no carotid bruit, no masses Cardiac: RRR; 2/6 murmur, no rubs, or gallops Respiratory:  clear to auscultation bilaterally, normal work of breathing GI: soft, nontender, nondistended, + BS MS: no deformity or atrophy; no edema; distal pulses are 2+ in all 4 extremities   Skin: warm and dry, no rash Neuro:  Strength and sensation are intact Psych: euthymic mood, full affect   EKG:  EKG is ordered today. The ekg ordered today demonstrates  sinus rhythm, sinus arrhythmia, heart rate 62, no acute ischemic changes, no Q waves, normal intervals.  No significant change from 10/05/2017  STRESS ECHO: 10/20/2017 - Stress ECG conclusions: There were no stress arrhythmias or   conduction abnormalities. The stress ECG was normal. - Staged echo: There was no echocardiographic evidence for   stress-induced ischemia. Impressions: - Normal stress echo.  CARDIAC MRI: 09/13/2013 FINDINGS: All 4 cardiac chambers were normal in size and function There was no RV or RA  enlargement. There was no hypertrophy The aortic, mitral and tricuspid valves were normal. There was no pericardial effusion. There was a defect in the most superior aspect of the membranous ventricular septum There appeared to be an aneurysm protruding into the RV outflow tract. This did not involve the aortic annulus or sinuses. It was clearly in the LVOT and involved the membranous septum The aortic valve itself was trileaflet and normal The 3 aortic sinuses were symmetrical, non dilated and measured 2.8 cm. There was no AR. The quantitative EF was 57% (EDV 110cc, ESV 48 cc SV 63 cc) There was no delayed hyperenhancement Aortic MRA: MRA showed normal aortic root and origin of the great vessels There was no sinus of valsalva aneurysm Sinus of Valsalva:  2.8 cm Aortic Root:  2.6 cm Arch:  2.2 cm with normal origin great vessels and no coarctation Descending Thoracic Aorta:  2.1 cm IMPRESSION: 1) Aneurysmal dilatation of the membranous ventricular septum extending into the RVOT with no apparent systolic flow or shunting 2) Normal sinus of valsalva 2.8 cm symmetrical in all 3 sinuses with no AR and normal trileaflet AV 3) Normal RA/LA/RV and LV EF 57% 4) No hyperenhancement on delayed gadolinium images 5) Normal aortic MRA with no aneurysm or coarctation and normal origin of the great vessels  Recent Labs: 01/06/2017: Hemoglobin 13.5; Platelets  188 11/04/2017: BUN 9; Creat 0.60; Potassium 4.7; Sodium 140; TSH 2.74    Lipid Panel    Component Value Date/Time   CHOL  07/19/2008 0625    160        ATP III CLASSIFICATION:  <200     mg/dL   Desirable  200-239  mg/dL   Borderline High  >=240    mg/dL   High   TRIG 61 07/19/2008 0625   HDL 37 (L) 07/19/2008 0625   CHOLHDL 4.3 07/19/2008 0625   VLDL 12 07/19/2008 0625   LDLCALC (H) 07/19/2008 0625    111        Total Cholesterol/HDL:CHD Risk Coronary Heart Disease Risk Table                     Men   Women  1/2 Average Risk   3.4   3.3     Wt Readings from Last 3 Encounters:  12/03/17 133 lb (60.3 kg)  11/09/17 132 lb (59.9 kg)  10/27/17 134 lb 3.2 oz (60.9 kg)     Other studies Reviewed: Additional studies/ records that were reviewed today include: Office notes and testing.  ASSESSMENT AND PLAN:  1.  Chest pain: She reached stage IV during her stress test with no chest pain.  She had normal images, but her heart rate was not at target when the images were performed.  She has continued to have multiple episodes of chest pain, though not consistent with exertion.  Multiple episodes are at rest, for example, when she lays down to go to bed at night. -I feel we need to definitively rule out coronary artery disease as a cause of her symptoms.  - I feel the cardiac CT is the best test as it will definitively rule out CAD but with less risk than a cardiac cath.  I explained that if the cardiac CT is negative, we can say for sure she does not have CAD.  However, she may still continue to have the pain.  She accepts this.  Check a BMET today, preserved the test and hopefully she can get it within  the next couple of weeks. -She was given a prescription for metoprolol 25 mg to be taken prior to the test.  2.  Hyperlipidemia: -Her total cholesterol and LDL went up on coconut oil.  She has stopped taking the coconut oil. -I explained that her triglycerides are low, I am not sure that  the fish oil is helping her at all, but if she wishes to take it it is not harming her.  3.  Septal aneurysm: I do not believe her chest pain is attributable to this.  If she begins getting problems with dyspnea on exertion or presyncope, more testing is indicated.  Otherwise, get an echocardiogram, possibly next year.   Current medicines are reviewed at length with the patient today.  The patient does not have concerns regarding medicines.  The following changes have been made:  no change  Labs/ tests ordered today include:   Orders Placed This Encounter  Procedures  . EKG 12-Lead     Disposition:   FU with Dr. Stanford Breed  Signed, Megan Ferries, PA-C  12/03/2017 2:11 PM    [place] Phone: [phone]; Fax: [phone]  This note was written with the assistance of speech recognition software. Please excuse any transcriptional errors.

## 2017-12-03 NOTE — Telephone Encounter (Signed)
SPOKE WITH PATIENT APPOINTMENT SCHEDULE TODAY AT 10 AM. PATIENT STATES SHE WILL BE HERE

## 2017-12-03 NOTE — Telephone Encounter (Signed)
New Message  Pt c/o of Chest Pain: STAT if CP now or developed within 24 hours  1. Are you having CP right now? yes  2. Are you experiencing any other symptoms (ex. SOB, nausea, vomiting, sweating)? SOB  3. How long have you been experiencing CP? Couple of weeks but has gotten worse in the last 24 hrs  4. Is your CP continuous or coming and going? Becoming continuous   5. Have you taken Nitroglycerin? No ?

## 2017-12-03 NOTE — Patient Instructions (Addendum)
Please arrive at the [facility] main entrance of [facility] at xx:xx AM (30-45 minutes prior to test start time)  [facility] [place], [place] [address] [phone]  Proceed to the [facility] Radiology Department (First Floor).  Please follow these instructions carefully (unless otherwise directed):  Hold all erectile dysfunction medications at least 48 hours prior to test.  On the Night Before the Test: . Drink plenty of water. . Do not consume any caffeinated/decaffeinated beverages or chocolate 12 hours prior to your test. . Do not take any antihistamines 12 hours prior to your test.  On the Day of the Test: . Drink plenty of water. Do not drink any water within one hour of the test. . Do not eat any food 4 hours prior to the test. . You may take your regular medications prior to the test. . IF NOT ON A BETA BLOCKER - Take 25 mg of lopressor (metoprolol) one hour before the test.   After the Test: . Drink plenty of water. . After receiving IV contrast, you may experience a mild flushed feeling. This is normal. . On occasion, you may experience a mild rash up to 24 hours after the test. This is not dangerous. If this occurs, you can take Benadryl 25 mg and increase your fluid intake. . If you experience trouble breathing, this can be serious. If it is severe call 911 IMMEDIATELY. If it is mild, please call our office.        Your physician recommends that you schedule a follow-up appointment after Cardiac CT with Dr.Crenshaw.

## 2017-12-07 DIAGNOSIS — L219 Seborrheic dermatitis, unspecified: Secondary | ICD-10-CM | POA: Diagnosis not present

## 2017-12-07 DIAGNOSIS — L650 Telogen effluvium: Secondary | ICD-10-CM | POA: Diagnosis not present

## 2017-12-08 DIAGNOSIS — Z1231 Encounter for screening mammogram for malignant neoplasm of breast: Secondary | ICD-10-CM | POA: Diagnosis not present

## 2017-12-09 DIAGNOSIS — F4322 Adjustment disorder with anxiety: Secondary | ICD-10-CM | POA: Diagnosis not present

## 2017-12-21 ENCOUNTER — Encounter: Payer: Self-pay | Admitting: Internal Medicine

## 2017-12-22 ENCOUNTER — Encounter: Payer: BLUE CROSS/BLUE SHIELD | Admitting: Internal Medicine

## 2017-12-23 ENCOUNTER — Encounter: Payer: BLUE CROSS/BLUE SHIELD | Admitting: Internal Medicine

## 2017-12-23 DIAGNOSIS — H2513 Age-related nuclear cataract, bilateral: Secondary | ICD-10-CM | POA: Diagnosis not present

## 2017-12-23 DIAGNOSIS — H04123 Dry eye syndrome of bilateral lacrimal glands: Secondary | ICD-10-CM | POA: Diagnosis not present

## 2017-12-23 DIAGNOSIS — H40013 Open angle with borderline findings, low risk, bilateral: Secondary | ICD-10-CM | POA: Diagnosis not present

## 2017-12-24 ENCOUNTER — Encounter: Payer: BLUE CROSS/BLUE SHIELD | Admitting: Internal Medicine

## 2017-12-24 ENCOUNTER — Other Ambulatory Visit: Payer: Self-pay | Admitting: Oncology

## 2017-12-24 DIAGNOSIS — D708 Other neutropenia: Secondary | ICD-10-CM

## 2017-12-24 DIAGNOSIS — F4322 Adjustment disorder with anxiety: Secondary | ICD-10-CM | POA: Diagnosis not present

## 2017-12-29 ENCOUNTER — Ambulatory Visit: Payer: BLUE CROSS/BLUE SHIELD | Admitting: Family Medicine

## 2017-12-29 ENCOUNTER — Ambulatory Visit (HOSPITAL_BASED_OUTPATIENT_CLINIC_OR_DEPARTMENT_OTHER)
Admission: RE | Admit: 2017-12-29 | Discharge: 2017-12-29 | Disposition: A | Discharge status: Home / Self Care | Payer: BLUE CROSS/BLUE SHIELD | Source: Ambulatory Visit | Attending: Family Medicine | Admitting: Family Medicine

## 2017-12-29 ENCOUNTER — Telehealth: Payer: Self-pay | Admitting: Physician Assistant

## 2017-12-29 ENCOUNTER — Encounter: Payer: Self-pay | Admitting: Family Medicine

## 2017-12-29 VITALS — BP 103/62 | HR 56 | Ht 70.0 in | Wt 132.0 lb

## 2017-12-29 DIAGNOSIS — M25551 Pain in right hip: Secondary | ICD-10-CM | POA: Diagnosis not present

## 2017-12-29 DIAGNOSIS — M1611 Unilateral primary osteoarthritis, right hip: Secondary | ICD-10-CM | POA: Diagnosis not present

## 2017-12-29 DIAGNOSIS — R072 Precordial pain: Secondary | ICD-10-CM

## 2017-12-29 DIAGNOSIS — Z01818 Encounter for other preprocedural examination: Secondary | ICD-10-CM

## 2017-12-29 NOTE — Telephone Encounter (Signed)
SPOKE TO PATIENT . SHE HAD SEVERAL QUESTION IN REGARDS TO UP COMING CORONARY CTA   PATIENT AWARE  RN WILL HAVE TO RESEARCH ANSWER AN CALL HER BACK . PATIENT IS CONCERNED ABOUT CONTRAST FOR CORONARY  CTA DUE TO AUTOIMMUNE.

## 2017-12-29 NOTE — Telephone Encounter (Signed)
LEFT MESSAGE TO CALL BACK

## 2017-12-29 NOTE — Telephone Encounter (Signed)
REVIEWED QUESTIONS WITH PHYSICIAN  CONTRAST SHOULD NOT INTERFERE WITH  HER AUTOIMMUNE DEF.    IF HEART IS IN HIGH 70'S 0R HIGHER PRIOR  TO LEAVING HOME ON 01/28/18 TAKE METOPROLOL, IF NOT TAKE MEDICATION WITH YOU TO THE HOSPITAL .  LAB WAS PLACED IN COMPUTER FOR BMP - CCTA . PATIENT WILL OBTAIN LABS WHEN SHE GOES TO LAB FOR ANOTHER DOCTOR [place].  PATIENT VERBALIZED UNDERSTANDING.

## 2017-12-29 NOTE — Telephone Encounter (Signed)
New message    Patient would like to do the test without contrast if possible due to autoimmune deficiancy.  Patient also has question about the pill she is to take an hour before the test due to her having low blood pressure.  Patient will also needed an order put in for lab work 1 week prior to the test   Cardiac Ct Scheduled for 01/28/18 930a

## 2017-12-29 NOTE — Patient Instructions (Addendum)
We will go ahead with an MRI of your hip to assess for avascular necrosis. I suspect your pain is due to arthritis that is worse than we can see on x-rays. Ideally follow up with me the day after you have the MRI for a no charge visit so we can go over results but if this is too difficult I can call you. For the compensatory IT band syndrome and hip external rotator weakness/strain start hip abduction and standing hip rotations but without going all the way to the outside - 3 sets of 10 once a day. Pick stretches and hold 20-30 seconds, but don't fully externally or internally rotate this hip.

## 2017-12-30 ENCOUNTER — Encounter: Payer: Self-pay | Admitting: Family Medicine

## 2017-12-30 DIAGNOSIS — M25551 Pain in right hip: Secondary | ICD-10-CM | POA: Insufficient documentation

## 2017-12-30 DIAGNOSIS — R072 Precordial pain: Secondary | ICD-10-CM | POA: Diagnosis not present

## 2017-12-30 DIAGNOSIS — Z01818 Encounter for other preprocedural examination: Secondary | ICD-10-CM | POA: Diagnosis not present

## 2017-12-30 DIAGNOSIS — M810 Age-related osteoporosis without current pathological fracture: Secondary | ICD-10-CM | POA: Diagnosis not present

## 2017-12-30 NOTE — Progress Notes (Signed)
PCP: Lavone Orn, MD  Subjective:   HPI: Patient is a 62 y.o. female here for right hip pain.  Patient reports for the past 2 weeks she's had anterior right hip pain that is sharp and up to 5-6/10 level. Has had off and on problems for 5 years but this is more constant and severe. No numbness or tingling. Goes into lateral knee and some posteriorly. Does a yoga class. Using a topical hemp cream but no medications. No back pain. No bowel/bladder dysfunction.  Past Medical History:  Diagnosis Date  . Aneurysm of cardiac wall, congenital    a.  septal aneurysm extending into the RVOT (by cardiac MRI in 08/2013)  . Asthma   . Depression   . Hypothyroidism   . Lyme disease   . MVP (mitral valve prolapse)   . Orthostatic hypotension   . Osteoporosis   . Peripheral neuropathy 06/09/2012  . Plantar fasciitis   . Pneumothorax     Current Outpatient Medications on File Prior to Visit  Medication Sig Dispense Refill  . bimatoprost (LATISSE) 0.03 % ophthalmic solution Place one drop on applicator and apply evenly along the skin of the eyebrow once daily at bedtime; repeat procedure for second eye (use a clean applicator).    [name] 5-Hydroxytryptophan (5-HTP) 50 MG CAPS Take 50 mg by mouth daily.    . Alpha-Lipoic Acid 300 MG TABS Take 600 mg by mouth daily.    Francia Greaves THYROID 30 MG tablet Take 30 mg by mouth daily.  6  . Ascorbic Acid (VITAMIN C PO) Take 4,000 Units by mouth daily.    [name] BIOTIN PO Take 20,000 mcg by mouth.    . Cholecalciferol (VITAMIN D3) 10000 units capsule Take 10,000 Units by mouth daily.    . Coenzyme Q10 (CO Q 10) 100 MG CAPS Take 200 mg by mouth daily.    . Digestive Enzymes (DIGESTIVE ENZYME PO) Take 1 tablet by mouth daily.    [name] GLUTATHIONE PO Take 1 capsule by mouth daily.    [name] MAGNESIUM CITRATE PO Take 800 mg by mouth.     . Menaquinone-7 (VITAMIN K2 PO) Take 1 tablet by mouth daily.    . metoprolol tartrate (LOPRESSOR) 25 MG tablet Take 25 mg 1 hour before  Cardiac CT 1 tablet 0  . Misc Natural Products (CHLORELLA) 500 MG CAPS Take 500 mg by mouth daily.    . Multiple Vitamin (MULTIVITAMIN WITH MINERALS) TABS Take 1 tablet by mouth daily.    . NON FORMULARY Take 2 tablets by mouth daily. "NT Energy Factor"    . NON FORMULARY Take 3 tablets by mouth daily. "Energy Multi Flex"    . NON FORMULARY Take 1 tablet by mouth daily. "GI Benefits" supplement    . Omega-3 Fatty Acids (FISH OIL) 1000 MG CAPS Take 1,000 mg by mouth daily.    . Probiotic Product (PROBIOTIC DAILY PO) Take 1 capsule by mouth. 1 capsule of 5 billion colonies    . SUPREP BOWEL PREP KIT 17.5-3.13-1.6 GM/177ML SOLN Take 1 kit by mouth as directed. 354 mL 0   No current facility-administered medications on file prior to visit.     Past Surgical History:  Procedure Laterality Date  . COLONOSCOPY WITH PROPOFOL N/A 02/22/2013   Procedure: COLONOSCOPY WITH PROPOFOL;  Surgeon: Garlan Fair, MD;  Location: [facility];  Service: Endoscopy;  Laterality: N/A;  . MOUTH SURGERY  01/2012   bone graft in mouth  . NASAL SINUS SURGERY Right  06/12/2014   Procedure: RIGHT ENDOSCOPIC MAXILLARY ANTROSTOMY;  Surgeon: Ascencion Dike, MD;  Location: [place];  Service: ENT;  Laterality: Right;  . ROOT CANAL  02/27/2012  . TONSILLECTOMY  1962    No Known Allergies  Social History   Socioeconomic History  . Marital status: Divorced    Spouse name: Not on file  . Number of children: 0  . Years of education: Not on file  . Highest education level: Not on file  Occupational History    Comment: Career and life coach  Social Needs  . Financial resource strain: Not on file  . Food insecurity:    Worry: Not on file    Inability: Not on file  . Transportation needs:    Medical: Not on file    Non-medical: Not on file  Tobacco Use  . Smoking status: Never Smoker  . Smokeless tobacco: Never Used  Substance and Sexual Activity  . Alcohol use: No    Alcohol/week: 0.0 oz  .  Drug use: No  . Sexual activity: Not on file  Lifestyle  . Physical activity:    Days per week: Not on file    Minutes per session: Not on file  . Stress: Not on file  Relationships  . Social connections:    Talks on phone: Not on file    Gets together: Not on file    Attends religious service: Not on file    Active member of club or organization: Not on file    Attends meetings of clubs or organizations: Not on file    Relationship status: Not on file  . Intimate partner violence:    Fear of current or ex partner: Not on file    Emotionally abused: Not on file    Physically abused: Not on file    Forced sexual activity: Not on file  Other Topics Concern  . Not on file  Social History Narrative   Patient is single and lives alone.   Patient is self-employed, career and life coaching.   Patient drinks two to four cups of caffeine daily.    Family History  Problem Relation Age of Onset  . Hyperlipidemia Father   . Heart failure Father   . Prostate cancer Father   . Kidney failure Father   . Hypertension Father   . Angina Mother   . Hypertension Mother   . Lung cancer Mother   . Colon cancer Mother   . Lung cancer Paternal Grandfather   . Breast cancer Paternal Grandmother   . Diabetes Paternal Grandmother     BP 103/62   Pulse (!) 56   Ht '5\' 10"'  (1.778 m)   Wt 132 lb (59.9 kg)   BMI 18.94 kg/m   Review of Systems: See HPI above.     Objective:  Physical Exam:  Gen: NAD, comfortable in exam room  Back: No gross deformity, scoliosis. No TTP .  No midline or bony TTP. FROM. Strength LEs 5/5 all muscle groups.   2+ MSRs in patellar and achilles tendons, equal bilaterally. Negative SLRs. Sensation intact to light touch bilaterally.  Right hip: No deformity. Mild limitation IR with 5/5 strength and no pain on resisted motions. Mild TTP proximal IT band and posterior to trochanter. NVI distally. Positive logroll. Negative fabers and piriformis  stretches.   Assessment & Plan:  1. Right hip pain - independently reviewed radiographs and only very mild arthritis seen.  Pain is more severe than  would be expected to be caused by very mild arthritis.  Will go ahead with MRI to assess for avascular necrosis.  Shown home exercises she can do for compensatory IT band syndrome, hip external rotator weakness.  Tylenol, topical medications as needed.

## 2017-12-30 NOTE — Addendum Note (Signed)
Addended by: Sherrie George F on: 12/30/2017 04:16 PM   Modules accepted: Orders

## 2017-12-30 NOTE — Assessment & Plan Note (Signed)
independently reviewed radiographs and only very mild arthritis seen.  Pain is more severe than would be expected to be caused by very mild arthritis.  Will go ahead with MRI to assess for avascular necrosis.  Shown home exercises she can do for compensatory IT band syndrome, hip external rotator weakness.  Tylenol, topical medications as needed.

## 2017-12-31 LAB — BASIC METABOLIC PANEL
BUN/Creatinine Ratio: 14 (ref 12–28)
BUN: 11 mg/dL (ref 8–27)
CO2: 25 mmol/L (ref 20–29)
Calcium: 9.2 mg/dL (ref 8.7–10.3)
Chloride: 102 mmol/L (ref 96–106)
Creatinine, Ser: 0.78 mg/dL (ref 0.57–1.00)
GFR calc Af Amer: 94 mL/min/{1.73_m2} (ref >59)
GFR calc non Af Amer: 82 mL/min/{1.73_m2} (ref >59)
Glucose: 95 mg/dL (ref 65–99)
Potassium: 4.2 mmol/L (ref 3.5–5.2)
Sodium: 142 mmol/L (ref 134–144)

## 2018-01-01 ENCOUNTER — Telehealth: Payer: Self-pay | Admitting: Physician Assistant

## 2018-01-01 NOTE — Telephone Encounter (Signed)
Follow Up: ° ° ° °Returning your call, concerning her lab results. °

## 2018-01-04 NOTE — Addendum Note (Signed)
Addended by: Truddie Crumble on: 01/04/2018 01:40 PM   Modules accepted: Orders

## 2018-01-05 ENCOUNTER — Other Ambulatory Visit (INDEPENDENT_AMBULATORY_CARE_PROVIDER_SITE_OTHER): Payer: BLUE CROSS/BLUE SHIELD

## 2018-01-05 DIAGNOSIS — D708 Other neutropenia: Secondary | ICD-10-CM

## 2018-01-05 LAB — CBC WITH DIFFERENTIAL/PLATELET
Abs Immature Granulocytes: 0 10*3/uL (ref 0.0–0.1)
Basophils Absolute: 0 10*3/uL (ref 0.0–0.1)
Basophils Relative: 1 %
Eosinophils Absolute: 0 10*3/uL (ref 0.0–0.7)
Eosinophils Relative: 1 %
HCT: 42.9 % (ref 36.0–46.0)
Hemoglobin: 13.9 g/dL (ref 12.0–15.0)
Immature Granulocytes: 0 %
Lymphocytes Relative: 31 %
Lymphs Abs: 1.1 10*3/uL (ref 0.7–4.0)
MCH: 29.5 pg (ref 26.0–34.0)
MCHC: 32.4 g/dL (ref 30.0–36.0)
MCV: 91.1 fL (ref 78.0–100.0)
Monocytes Absolute: 0.2 10*3/uL (ref 0.1–1.0)
Monocytes Relative: 7 %
Neutro Abs: 2.1 10*3/uL (ref 1.7–7.7)
Neutrophils Relative %: 60 %
Platelets: 230 10*3/uL (ref 150–400)
RBC: 4.71 MIL/uL (ref 3.87–5.11)
RDW: 12.6 % (ref 11.5–15.5)
WBC: 3.4 10*3/uL — ABNORMAL LOW (ref 4.0–10.5)

## 2018-01-05 LAB — SAVE SMEAR

## 2018-01-06 ENCOUNTER — Telehealth: Payer: Self-pay | Admitting: *Deleted

## 2018-01-06 ENCOUNTER — Other Ambulatory Visit: Payer: Self-pay

## 2018-01-06 DIAGNOSIS — R072 Precordial pain: Secondary | ICD-10-CM

## 2018-01-06 NOTE — Progress Notes (Signed)
HPI: FU dyspnea.  MRA January 2015 showed aneurysmal dilatation of the membranous ventricular septum extending into the right ventricular outflow tract.  There was no sinus of Valsalva aneurysm.  Ejection fraction 57%.  Carotid Dopplers August 2016 normal.  Echocardiogram May 2018 showed normal LV systolic function and mild mitral regurgitation.  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 Lower ext arterial Dopplers May 2018 normal. Stress echocardiogram Feb 2019 was normal.  Patient seen April 2019 with chest pain.  Cardiac CTA ordered but not performed as of yet.  Since last seen patient continues to have occasional pain in the left breast area.  Occurs both with exertion and at rest with no radiation.  No associated symptoms.  Lasts 15 minutes and resolves.  Not pleuritic or positional and not clearly exertional.  She denies dyspnea on exertion, orthopnea, PND or pedal edema.  Some dizziness with standing.  Current Outpatient Medications  Medication Sig Dispense Refill  . 5-Hydroxytryptophan (5-HTP) 50 MG CAPS Take 50 mg by mouth daily.    . Alpha-Lipoic Acid 300 MG TABS Take 600 mg by mouth daily.    . AMINO ACIDS COMPLEX PO Take 1 Scoop by mouth every morning.    Francia Greaves THYROID 30 MG tablet Take 30 mg by mouth daily.  6  . Ascorbic Acid (VITAMIN C PO) Take 4,000 Units by mouth daily.    . bimatoprost (LATISSE) 0.03 % ophthalmic solution Place one drop on applicator and apply evenly along the skin of the eyebrow once daily at bedtime; repeat procedure for second eye (use a clean applicator).    [name] BIOTIN PO Take 20,000 mcg by mouth.    . calcium gluconate 500 MG tablet Take 1 tablet by mouth daily.    . Cholecalciferol (VITAMIN D3) 10000 units capsule Take 20,000 Units by mouth daily.     . Coenzyme Q10 (CO Q 10) 100 MG CAPS Take 200 mg by mouth daily.    . Cyanocobalamin (VITAMIN B-12 ER PO) Take 2,500 mcg by mouth every morning.    . Digestive Enzymes  (DIGESTIVE ENZYME PO) Take 1 tablet by mouth daily.    [name] GLUTATHIONE PO Take 1 capsule by mouth daily.    [name] MAGNESIUM CITRATE PO Take 800 mg by mouth.     . Menaquinone-7 (VITAMIN K2 PO) Take 1 tablet by mouth daily.    . metoprolol tartrate (LOPRESSOR) 25 MG tablet Take 25 mg 1 hour before Cardiac CT 1 tablet 0  . Misc Natural Products (CHLORELLA) 500 MG CAPS Take 500 mg by mouth daily.    . Multiple Vitamin (MULTIVITAMIN WITH MINERALS) TABS Take 1 tablet by mouth daily.    . NON FORMULARY Take 2 tablets by mouth daily. "NT Energy Factor"    . NON FORMULARY Take 3 tablets by mouth daily. "Energy Multi Flex"    . NON FORMULARY Take 1 tablet by mouth daily. "GI Benefits" supplement    . Omega-3 Fatty Acids (FISH OIL) 1000 MG CAPS Take 1,000 mg by mouth daily.    . Probiotic Product (PROBIOTIC DAILY PO) Take 1 capsule by mouth. 1 capsule of 5 billion colonies    . SUPREP BOWEL PREP KIT 17.5-3.13-1.6 GM/177ML SOLN Take 1 kit by mouth as directed. 354 mL 0   No current facility-administered medications for this visit.      Past Medical History:  Diagnosis Date  . Aneurysm of cardiac wall, congenital    a.  septal aneurysm extending into the RVOT (by cardiac MRI in 08/2013)  . Asthma   . Depression   . Hypothyroidism   . Lyme disease   . MVP (mitral valve prolapse)   . Orthostatic hypotension   . Osteoporosis   . Peripheral neuropathy 06/09/2012  . Plantar fasciitis   . Pneumothorax     Past Surgical History:  Procedure Laterality Date  . COLONOSCOPY WITH PROPOFOL N/A 02/22/2013   Procedure: COLONOSCOPY WITH PROPOFOL;  Surgeon: Garlan Fair, MD;  Location: [facility];  Service: Endoscopy;  Laterality: N/A;  . MOUTH SURGERY  01/2012   bone graft in mouth  . NASAL SINUS SURGERY Right 06/12/2014   Procedure: RIGHT ENDOSCOPIC MAXILLARY ANTROSTOMY;  Surgeon: Ascencion Dike, MD;  Location: [place];  Service: ENT;  Laterality: Right;  . ROOT CANAL  02/27/2012  .  TONSILLECTOMY  1962    Social History   Socioeconomic History  . Marital status: Divorced    Spouse name: Not on file  . Number of children: 0  . Years of education: Not on file  . Highest education level: Not on file  Occupational History    Comment: Career and life coach  Social Needs  . Financial resource strain: Not on file  . Food insecurity:    Worry: Not on file    Inability: Not on file  . Transportation needs:    Medical: Not on file    Non-medical: Not on file  Tobacco Use  . Smoking status: Never Smoker  . Smokeless tobacco: Never Used  Substance and Sexual Activity  . Alcohol use: No    Alcohol/week: 0.0 oz  . Drug use: No  . Sexual activity: Not on file  Lifestyle  . Physical activity:    Days per week: Not on file    Minutes per session: Not on file  . Stress: Not on file  Relationships  . Social connections:    Talks on phone: Not on file    Gets together: Not on file    Attends religious service: Not on file    Active member of club or organization: Not on file    Attends meetings of clubs or organizations: Not on file    Relationship status: Not on file  . Intimate partner violence:    Fear of current or ex partner: Not on file    Emotionally abused: Not on file    Physically abused: Not on file    Forced sexual activity: Not on file  Other Topics Concern  . Not on file  Social History Narrative   Patient is single and lives alone.   Patient is self-employed, career and life coaching.   Patient drinks two to four cups of caffeine daily.    Family History  Problem Relation Age of Onset  . Hyperlipidemia Father   . Heart failure Father   . Prostate cancer Father   . Kidney failure Father   . Hypertension Father   . Angina Mother   . Hypertension Mother   . Lung cancer Mother   . Colon cancer Mother   . Lung cancer Paternal Grandfather   . Breast cancer Paternal Grandmother   . Diabetes Paternal Grandmother     ROS: no fevers or  chills, productive cough, hemoptysis, dysphasia, odynophagia, melena, hematochezia, dysuria, hematuria, rash, seizure activity, orthopnea, PND, pedal edema, claudication. Remaining systems are negative.  Physical Exam: Well-developed well-nourished in no acute distress.  Skin is warm and  dry.  HEENT is normal.  Neck is supple.  Chest is clear to auscultation with normal expansion.  Cardiovascular exam is regular rate and rhythm.  Abdominal exam nontender or distended. No masses palpated. Extremities show no edema. neuro grossly intact  ECG-sinus rhythm at a rate of 55.  No ST changes.  Personally reviewed  A/P  1 mitral valve prolapse-not evident on most recent echocardiogram.  Mild mitral regurgitation.  2 history of orthostatic hypotension-symptoms are reasonably well controlled.  We discussed the importance of maintaining fluid restriction and increased sodium intake.  3 possible right sinus of Valsalva aneurysm-previous MRI negative.  4 chest pain-previous stress echocardiogram negative but symptoms persist.  We have ordered a cardiac CTA to further assess.  This will also rule out sinus of Valsalva aneurysm.  She has significant dizziness when she took metoprolol in the past as her blood pressure runs low.  Her heart rate also runs in the 50s.  I have asked her not to take her metoprolol the morning of her CTA.  5 hyperlipidemia-management per primary care.  Kirk Ruths, MD

## 2018-01-06 NOTE — Telephone Encounter (Signed)
Called pt - no answer; left message to give me a call back. 

## 2018-01-06 NOTE — Telephone Encounter (Signed)
Call from pt - informed "WBC stable c/w prevous: 3,400" per Dr Beryle Beams. She an appt to see [place] on 5/20 - wanted to know if she needs to keep this appt but she also said she has a few questions - told to keep appt unless Dr Darnell Level says otherwise.

## 2018-01-06 NOTE — Telephone Encounter (Signed)
-----   Message from Annia Belt, MD sent at 01/05/2018  2:20 PM EDT ----- Call pt: WBC stable c/w prevous: 3,400

## 2018-01-07 DIAGNOSIS — F4322 Adjustment disorder with anxiety: Secondary | ICD-10-CM | POA: Diagnosis not present

## 2018-01-08 NOTE — Telephone Encounter (Signed)
Spoke with patient yesterday and gave her lab results; patient voiced understanding.

## 2018-01-11 ENCOUNTER — Encounter: Payer: Self-pay | Admitting: Oncology

## 2018-01-11 ENCOUNTER — Other Ambulatory Visit: Payer: Self-pay

## 2018-01-11 ENCOUNTER — Ambulatory Visit: Payer: BLUE CROSS/BLUE SHIELD | Admitting: Oncology

## 2018-01-11 VITALS — BP 109/58 | HR 54 | Temp 97.7°F | Ht 69.5 in | Wt 132.1 lb

## 2018-01-11 DIAGNOSIS — D801 Nonfamilial hypogammaglobulinemia: Secondary | ICD-10-CM

## 2018-01-11 DIAGNOSIS — Z98818 Other dental procedure status: Secondary | ICD-10-CM

## 2018-01-11 DIAGNOSIS — D708 Other neutropenia: Secondary | ICD-10-CM | POA: Diagnosis not present

## 2018-01-11 DIAGNOSIS — G609 Hereditary and idiopathic neuropathy, unspecified: Secondary | ICD-10-CM

## 2018-01-11 DIAGNOSIS — R5382 Chronic fatigue, unspecified: Secondary | ICD-10-CM

## 2018-01-11 DIAGNOSIS — Z8619 Personal history of other infectious and parasitic diseases: Secondary | ICD-10-CM | POA: Diagnosis not present

## 2018-01-11 DIAGNOSIS — Z9889 Other specified postprocedural states: Secondary | ICD-10-CM | POA: Diagnosis not present

## 2018-01-11 NOTE — Patient Instructions (Addendum)
To lab today Container for 24 hour urine collection Basic metabolic panel when urine returned We will call you with results Please schedule new patient visit with Dr Sullivan Lone at the cancer center, [facility], in 1 year

## 2018-01-11 NOTE — Progress Notes (Signed)
Hematology and Oncology Follow Up Visit  Megan [name] [id number] 1955/12/02 62 y.o. 01/11/2018 2:52 PM   Principle Diagnosis: Encounter Diagnoses  Name Primary?  . Hypogammaglobulinemia ([place]) Yes  . Other neutropenia [facility])   Clinical summary: 62 year old woman initially referred to me in October 2013 to evaluate chronic neutropenia and idiopathic distal neuropathy affecting her feet. An extensive evaluation was done including myeloma screen, screen for collagen vascular disorders, screen for bone marrow suppressive viral agents(CMV, EBV, HIV, hepatitis A, B, C) , toxic drug screen including lead levels, and was unrevealing. A single spot urine specimen was positive for the presence of monoclonal lambda immunoglobulin. However when I repeated a 24-hour urine study in June of 2015, there was no proteinuria and no monoclonal proteins. She tested negative 2 for the presence of cryoglobulins. Normal ceruloplasmin level. She had a decrease in total serum IgG level without any monoclonal proteins in the serum. Other than the chronic leukopenia with a normal white count differential, she has never developed anemia or thrombocytopenia. She has no adenopathy or organomegaly on exam. She has never developed any progressive constitutional symptoms. She has not had problems with frequent or severe infection. Neuropathy remains limited to her feet. She had partial relief of her symptoms on a Vegan diet. She had a surgical procedure on her sinuses on June 12, 2014. Specimens were negative for bacteria, fungi, or AFB. She sought additional consultation with an alternative medicine provider. She had an exposure to black mold back in 2009 and dates most of her symptoms subsequent to that exposure. She tested positive for the presence of "mold" in her urine. She was started on amphotericin B. She only received 1 dose since it caused significant toxicity. She was put on vitamin C, niacin, multiple vitamins, and  acidophilus and a another herbal antifungal medication and does feel that her neuropathy has improved about 50%. At one point she was told that she probably has chronic Lyme disease.  She has had chronic,persistent, fatigue and neuropathy. At time of her visit with me in June 2017 she had developed aching in her left eye. No obvious disease on exam by an ophthalmologist.  She brought me copies of most recent laboratory studies at time of that visit. White count as low as 2200 on 11/14/2015 but always with a normal differential: 57 neutrophils, 31 lymphs, 9 monocytes. Repeat CBC on Jan 15, 2016 with total white count 2500, 52 neutrophils, 35 lymphocytes, 10 monocytes. ESR 6 mm. Rheumatoid factor negative. She was tested for Lyme disease again on 11/14/2015. IgM titers negative. IgG Western blot negative but positive IgG for 3 out of proximally 10 serotypes tested. Positive IgG against mycoplasma but negative IgM. Flow cytometry Studies done on peripheral blood in December 2015 in a laboratory outside of our system report stated population of CD8 negative/CD57 positive lymphocytes 29/mcL with normal range 60-360. She was told that this meant that her immune system was suppressed. I repeated flow cytometry on peripheral blood at time of her visit with me in June 2017. There were no monoclonal B or T cell populations.    Interim History:  She continues to seek allopathic and alternative medicine assistance in trying to get a definitive diagnosis for her chronic neuropathy.  Overall she believes that her distal neuropathy is stable and not progressive since last visit.  She is having some problems in her mouth which she relates to recent surgery on her gums done in November 2018.  An alternative medical provider recommended  removing all of her crowns since there was nickel containing metal underneath the crowns as part of her fillings.  The theory is that the nickel and/or mercury could be related to her  neuropathy.  She then developed some kind of infection and states that she had to have surgery on the gums and  platelets were inserted. She brought a number of laboratory studies done on Dec 30, 2017.  These were sent to [facility].  White count is chronically low but lower on this particular day at 2500 with hemoglobin 13.5, MCV 89, platelets 229,000.  White count differential with 54% neutrophils, 31 lymphocytes, 13 monocytes, 1 eosinophil, 1 basophil.  Serum iron 105, percent saturation 28, TIBC 377, ferritin 25 (15-150).  Methylmalonic acid 131  (0-378), vitamin B6 64.9 mcg/L (2 - 32.8), antiparietal cell antibodies elevated 105.9 units, positive greater than 24.9.  125 hydroxy vitamin D level normal at 82.4, homocystine normal at 7.9 mol/L, intact parathyroid hormone normal at 48 pg/mL.  Persistent decrease in IgG immunoglobulin less than 320 mg%,borderline decrease in IgA, low normal IgM.  This is a chronic finding.  SPEP showed no monoclonal protein.  Serum albumin borderline decreased. She has no other new symptoms.  She denies any recent or recurrent infections other than the recent problem in her mouth related to local oral surgery.  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  She continues on a vegetarian diet.  She has added small amounts of fish and eggs to boost her protein and also uses a protein powder in her smoothies.  CBC done in anticipation of today's visit is well within the range of previous values with white count 3400, 60% neutrophils, 31 lymphocytes, 7 monocytes, 1 eosinophil, 1 basophil, hemoglobin 13.9, hematocrit 42.9, MCV 91,  platelet count 230,000.  Medications: reviewed  Allergies: No Known Allergies  Review of Systems: See interim history Remaining ROS negative:   Physical Exam: Blood pressure (!) 109/58, pulse (!) 54, temperature 97.7 F (36.5 C), temperature source Oral, height 5' 9.5" (1.765 m), weight 132 lb 1.6 oz (59.9 kg), SpO2  100 %. Wt Readings from Last 3 Encounters:  01/11/18 132 lb 1.6 oz (59.9 kg)  12/29/17 132 lb (59.9 kg)  12/03/17 133 lb (60.3 kg)     General appearance: Thin but adequately nourished Caucasian woman HENNT: Pharynx no erythema, exudate, mass, or ulcer. No thyromegaly or thyroid nodules Lymph nodes: No cervical, supraclavicular, or axillary lymphadenopathy Breasts:  Lungs: Clear to auscultation, resonant to percussion throughout Heart: Regular rhythm, no murmur, no gallop, no rub, no click, no edema Abdomen: Soft, nontender, normal bowel sounds, no mass, no organomegaly Extremities: No edema, no calf tenderness Musculoskeletal: no joint deformities GU:  Vascular: Carotid pulses 2+, no bruits, distal pulses: Dorsalis pedis 1+ symmetric Neurologic: Alert, oriented, PERRLA,   cranial nerves grossly normal, motor strength 5 over 5, reflexes 1+ symmetric, upper body coordination normal, gait normal, trace decreased vibration sensation over the fingertips of the left hand but not the right hand.  Absent vibration sensation on the skin of the dorsum of the feet bilaterally. Skin: No rash or ecchymosis  Lab Results: CBC W/Diff    Component Value Date/Time   WBC 3.4 (L) 01/05/2018 0858   RBC 4.71 01/05/2018 0858   HGB 13.9 01/05/2018 0858   HGB 13.7 03/15/2013 1144   HCT 42.9 01/05/2018 0858   HCT 40.8 03/15/2013 1144   PLT 230 01/05/2018 0858   PLT 210 03/15/2013 1144  MCV 91.1 01/05/2018 0858   MCV 88.9 03/15/2013 1144   MCH 29.5 01/05/2018 0858   MCHC 32.4 01/05/2018 0858   RDW 12.6 01/05/2018 0858   RDW 13.8 03/15/2013 1144   LYMPHSABS 1.1 01/05/2018 0858   LYMPHSABS 1.0 03/15/2013 1144   MONOABS 0.2 01/05/2018 0858   MONOABS 0.3 03/15/2013 1144   EOSABS 0.0 01/05/2018 0858   EOSABS 0.0 03/15/2013 1144   BASOSABS 0.0 01/05/2018 0858   BASOSABS 0.1 03/15/2013 1144     Chemistry      Component Value Date/Time   NA 142 12/30/2017 0838   NA 140 05/24/2012 1053   K 4.2  12/30/2017 0838   K 4.1 05/24/2012 1053   CL 102 12/30/2017 0838   CL 106 05/24/2012 1053   CO2 25 12/30/2017 0838   CO2 27 05/24/2012 1053   BUN 11 12/30/2017 0838   BUN 13.0 05/24/2012 1053   CREATININE 0.78 12/30/2017 0838   CREATININE 0.60 11/04/2017 0918   CREATININE 0.7 05/24/2012 1053      Component Value Date/Time   CALCIUM 9.2 12/30/2017 0838   CALCIUM 9.5 05/24/2012 1053   ALKPHOS 83 08/08/2016 0840   ALKPHOS 65 05/24/2012 1053   AST 36 08/08/2016 0840   AST 29 05/24/2012 1053   ALT 18 08/08/2016 0840   ALT 13 05/24/2012 1053   BILITOT 0.8 08/08/2016 0840   BILITOT 0.50 05/24/2012 1053       Radiological Studies: Dg Hip Unilat With Pelvis 2-3 Views Right  Result Date: 12/29/2017 CLINICAL DATA:  Two weeks of right hip pain with no known injury. EXAM: DG HIP (WITH OR WITHOUT PELVIS) 2-3V RIGHT COMPARISON:  Coronal and sagittal reconstructed images through the pelvis from an abdominal and pelvic CT scan of August 08, 2016 FINDINGS: The bony pelvis is subjectively adequately mineralized. There is no lytic or blastic lesion. There is no acute or old fracture. AP and lateral views of the right hip reveal mild asymmetric joint space loss there is mild flattening of a portion of the roof of the right acetabulum. The articular surface of the right femoral head remains smoothly rounded. IMPRESSION: Mild osteoarthritic change of the right hip. No acute bony abnormality. Electronically Signed   By: David  Martinique M.D.   On: 12/29/2017 12:35    Impression:  1.  Chronic, idiopathic, leukopenia without anemia or thrombocytopenia. She was first told her white count was low when she was in her 7s.  She has not had a problem with recurrent infections despite low white count and hypogammaglobulinemia.  Given a stability and chronicity of her white count, we have mutually decided that a bone marrow biopsy would be very low yield. I cannot relate the isolated leukopenia to her chronic  idiopathic neuropathy. Although amyloidosis is in the differential for unexplained neuropathy, stability of her neurologic symptoms over time, absence of monoclonal protein, except for one transient occasion, in serum and urine, and absence of evidence of any other organ damage argues against this. I told her I would be retiring early next year.  She would still like to see a hematologist on an annual basis.  I will schedule her to see Dr.Kale at the [place] [facility].  2.  Hypogammaglobulinemia No history of recurrent infections.  No proteinuria.  Normal renal function.  No anemia.  Negative cryoglobulins x2.  3.  Low normal ferritin.  Elevated antiparietal cell antibodies.  No anemia.  Normal methylmalonic acid level. She obviously has enough iron for her  body's needs and does not have anemia or microcytosis.  Antiparietal antibodies can be associated with both iron and B12 malabsorption but we do not have any evidence for this in the absence of anemia.  I see no reason to give her parenteral iron.  4.  Idiopathic neuropathy limited to her feet. Ongoing neurologic follow-up.  No discernible cause.  Total visit time 45 minutes with 90% of the visit on direct face-to-face contact with the patient, review of outside and current lab data, and counseling.  CC: Patient Care Team: Lavone Orn, MD as PCP - General (Internal Medicine) Beryle Beams Alyson Locket, MD as Consulting Physician (Oncology) Alda Berthold, DO as Consulting Physician (Neurology) Arnetha Gula, MD as Consulting Physician (Family Medicine)   Murriel Hopper, MD, [place]  Hematology-Oncology/Internal Medicine     5/20/20192:52 PM

## 2018-01-12 ENCOUNTER — Ambulatory Visit: Payer: BLUE CROSS/BLUE SHIELD | Admitting: Oncology

## 2018-01-13 DIAGNOSIS — M545 Low back pain: Secondary | ICD-10-CM | POA: Diagnosis not present

## 2018-01-13 DIAGNOSIS — M25551 Pain in right hip: Secondary | ICD-10-CM | POA: Diagnosis not present

## 2018-01-13 DIAGNOSIS — F4322 Adjustment disorder with anxiety: Secondary | ICD-10-CM | POA: Diagnosis not present

## 2018-01-14 DIAGNOSIS — G609 Hereditary and idiopathic neuropathy, unspecified: Secondary | ICD-10-CM | POA: Diagnosis not present

## 2018-01-15 ENCOUNTER — Ambulatory Visit: Payer: BLUE CROSS/BLUE SHIELD | Admitting: Cardiology

## 2018-01-15 ENCOUNTER — Encounter: Payer: Self-pay | Admitting: Cardiology

## 2018-01-15 ENCOUNTER — Other Ambulatory Visit: Payer: BLUE CROSS/BLUE SHIELD

## 2018-01-15 VITALS — BP 98/68 | HR 55 | Ht 69.5 in | Wt 132.4 lb

## 2018-01-15 DIAGNOSIS — R072 Precordial pain: Secondary | ICD-10-CM | POA: Diagnosis not present

## 2018-01-15 DIAGNOSIS — I951 Orthostatic hypotension: Secondary | ICD-10-CM

## 2018-01-15 DIAGNOSIS — R002 Palpitations: Secondary | ICD-10-CM | POA: Diagnosis not present

## 2018-01-15 NOTE — Patient Instructions (Signed)
Your physician wants you to follow-up in: ONE YEAR WITH DR CRENSHAW You will receive a reminder letter in the mail two months in advance. If you don't receive a letter, please call our office to schedule the follow-up appointment.   If you need a refill on your cardiac medications before your next appointment, please call your pharmacy.  

## 2018-01-16 ENCOUNTER — Ambulatory Visit
Admission: RE | Admit: 2018-01-16 | Discharge: 2018-01-16 | Disposition: A | Discharge status: Home / Self Care | Payer: BLUE CROSS/BLUE SHIELD | Source: Ambulatory Visit | Attending: Family Medicine | Admitting: Family Medicine

## 2018-01-16 DIAGNOSIS — M25551 Pain in right hip: Secondary | ICD-10-CM

## 2018-01-17 ENCOUNTER — Encounter: Payer: Self-pay | Admitting: Cardiology

## 2018-01-18 DIAGNOSIS — D708 Other neutropenia: Secondary | ICD-10-CM | POA: Diagnosis not present

## 2018-01-18 DIAGNOSIS — D801 Nonfamilial hypogammaglobulinemia: Secondary | ICD-10-CM | POA: Diagnosis not present

## 2018-01-19 ENCOUNTER — Other Ambulatory Visit (INDEPENDENT_AMBULATORY_CARE_PROVIDER_SITE_OTHER): Payer: BLUE CROSS/BLUE SHIELD

## 2018-01-19 DIAGNOSIS — L738 Other specified follicular disorders: Secondary | ICD-10-CM | POA: Diagnosis not present

## 2018-01-19 DIAGNOSIS — D801 Nonfamilial hypogammaglobulinemia: Secondary | ICD-10-CM | POA: Diagnosis not present

## 2018-01-19 DIAGNOSIS — F4322 Adjustment disorder with anxiety: Secondary | ICD-10-CM | POA: Diagnosis not present

## 2018-01-19 DIAGNOSIS — D708 Other neutropenia: Secondary | ICD-10-CM

## 2018-01-20 DIAGNOSIS — M792 Neuralgia and neuritis, unspecified: Secondary | ICD-10-CM | POA: Diagnosis not present

## 2018-01-20 DIAGNOSIS — L819 Disorder of pigmentation, unspecified: Secondary | ICD-10-CM | POA: Diagnosis not present

## 2018-01-20 LAB — CREATININE CLEARANCE, URINE, 24 HOUR
Creatinine Clearance: 82 mL/min — ABNORMAL LOW (ref 88–128)
Creatinine, 24H Ur: 800 mg/24 hr (ref 800–1800)
Creatinine, Ser: 0.68 mg/dL (ref 0.57–1.00)
Creatinine, Urine: 45.7 mg/dL
GFR calc Af Amer: 108 mL/min/{1.73_m2} (ref >59)
GFR calc non Af Amer: 94 mL/min/{1.73_m2} (ref >59)

## 2018-01-20 LAB — BASIC METABOLIC PANEL
BUN/Creatinine Ratio: 15 (ref 12–28)
BUN: 11 mg/dL (ref 8–27)
CO2: 25 mmol/L (ref 20–29)
Calcium: 9.2 mg/dL (ref 8.7–10.3)
Chloride: 106 mmol/L (ref 96–106)
Creatinine, Ser: 0.71 mg/dL (ref 0.57–1.00)
GFR calc Af Amer: 106 mL/min/{1.73_m2} (ref >59)
GFR calc non Af Amer: 92 mL/min/{1.73_m2} (ref >59)
Glucose: 86 mg/dL (ref 65–99)
Potassium: 4.1 mmol/L (ref 3.5–5.2)
Sodium: 143 mmol/L (ref 134–144)

## 2018-01-20 NOTE — Addendum Note (Signed)
Addended by: Truddie Crumble on: 01/20/2018 03:43 PM   Modules accepted: Orders

## 2018-01-20 NOTE — Addendum Note (Signed)
Addended by: Truddie Crumble on: 01/20/2018 03:51 PM   Modules accepted: Orders

## 2018-01-21 LAB — IMMUNOFIXATION ELECTROPHORESIS

## 2018-01-21 NOTE — Addendum Note (Signed)
Addended by: Truddie Crumble on: 01/21/2018 03:37 PM   Modules accepted: Orders

## 2018-01-22 DIAGNOSIS — M25551 Pain in right hip: Secondary | ICD-10-CM | POA: Diagnosis not present

## 2018-01-22 DIAGNOSIS — M1611 Unilateral primary osteoarthritis, right hip: Secondary | ICD-10-CM | POA: Diagnosis not present

## 2018-01-22 DIAGNOSIS — M25561 Pain in right knee: Secondary | ICD-10-CM | POA: Diagnosis not present

## 2018-01-23 ENCOUNTER — Encounter: Payer: Self-pay | Admitting: Family Medicine

## 2018-01-25 LAB — UPEP/TP, 24-HR URINE
Albumin, U: 100 %
Alpha 1, Urine: 0 %
Alpha 2, Urine: 0 %
Beta, Urine: 0 %
Gamma Globulin, Urine: 0 %
Protein, 24H Urine: 217 mg/24 hr — ABNORMAL HIGH (ref 30–150)
Protein, Ur: 12.4 mg/dL

## 2018-01-25 LAB — SPECIMEN STATUS REPORT

## 2018-01-27 DIAGNOSIS — F4322 Adjustment disorder with anxiety: Secondary | ICD-10-CM | POA: Diagnosis not present

## 2018-01-27 DIAGNOSIS — M810 Age-related osteoporosis without current pathological fracture: Secondary | ICD-10-CM | POA: Diagnosis not present

## 2018-01-28 ENCOUNTER — Ambulatory Visit (HOSPITAL_COMMUNITY)
Admission: RE | Admit: 2018-01-28 | Discharge: 2018-01-28 | Disposition: A | Discharge status: Home / Self Care | Payer: BLUE CROSS/BLUE SHIELD | Source: Ambulatory Visit | Attending: Physician Assistant | Admitting: Physician Assistant

## 2018-01-28 ENCOUNTER — Ambulatory Visit (HOSPITAL_COMMUNITY): Admission: RE | Admit: 2018-01-28 | Payer: BLUE CROSS/BLUE SHIELD | Source: Ambulatory Visit

## 2018-01-28 DIAGNOSIS — R079 Chest pain, unspecified: Secondary | ICD-10-CM

## 2018-01-28 MED ORDER — METOPROLOL TARTRATE 5 MG/5ML IV SOLN
INTRAVENOUS | Status: AC
  Administered 2018-01-28: 2.5 mg via INTRAVENOUS
  Filled 2018-01-28: qty 5

## 2018-01-28 MED ORDER — NITROGLYCERIN 0.4 MG SL SUBL
0.8000 mg | SUBLINGUAL_TABLET | Freq: Once | SUBLINGUAL | Status: AC
  Administered 2018-01-28: 0.8 mg via SUBLINGUAL
  Filled 2018-01-28: qty 25

## 2018-01-28 MED ORDER — NITROGLYCERIN 0.4 MG SL SUBL
SUBLINGUAL_TABLET | SUBLINGUAL | Status: AC
  Administered 2018-01-28: 0.8 mg via SUBLINGUAL
  Filled 2018-01-28: qty 2

## 2018-01-28 MED ORDER — IOPAMIDOL (ISOVUE-370) INJECTION 76%
INTRAVENOUS | Status: AC
  Filled 2018-01-28: qty 100

## 2018-01-28 MED ORDER — IOPAMIDOL (ISOVUE-370) INJECTION 76%
100.0000 mL | Freq: Once | INTRAVENOUS | Status: AC | PRN
  Administered 2018-01-28: 80 mL via INTRAVENOUS

## 2018-01-28 MED ORDER — METOPROLOL TARTRATE 5 MG/5ML IV SOLN
5.0000 mg | INTRAVENOUS | Status: AC | PRN
  Administered 2018-01-28: 2.5 mg via INTRAVENOUS
  Administered 2018-01-28: 5 mg via INTRAVENOUS

## 2018-01-29 ENCOUNTER — Encounter: Payer: Self-pay | Admitting: Cardiology

## 2018-01-30 DIAGNOSIS — S73191D Other sprain of right hip, subsequent encounter: Secondary | ICD-10-CM | POA: Diagnosis not present

## 2018-01-30 DIAGNOSIS — M25551 Pain in right hip: Secondary | ICD-10-CM | POA: Diagnosis not present

## 2018-02-03 ENCOUNTER — Other Ambulatory Visit: Payer: Self-pay

## 2018-02-03 DIAGNOSIS — F4322 Adjustment disorder with anxiety: Secondary | ICD-10-CM | POA: Diagnosis not present

## 2018-02-08 DIAGNOSIS — F4322 Adjustment disorder with anxiety: Secondary | ICD-10-CM | POA: Diagnosis not present

## 2018-02-09 ENCOUNTER — Encounter: Payer: Self-pay | Admitting: Cardiology

## 2018-02-10 DIAGNOSIS — E039 Hypothyroidism, unspecified: Secondary | ICD-10-CM | POA: Diagnosis not present

## 2018-02-10 DIAGNOSIS — A6920 Lyme disease, unspecified: Secondary | ICD-10-CM | POA: Diagnosis not present

## 2018-02-10 DIAGNOSIS — G609 Hereditary and idiopathic neuropathy, unspecified: Secondary | ICD-10-CM | POA: Diagnosis not present

## 2018-02-10 NOTE — Progress Notes (Signed)
HPI: FU dyspnea.  MRA January 2015 showed aneurysmal dilatation of the membranous ventricular septum extending into the right ventricular outflow tract.  There was no sinus of Valsalva aneurysm.  Ejection fraction 57%.  Carotid Dopplers August 2016 normal.  Echocardiogram May 2018 showed normal LV systolic function and mild mitral regurgitation.  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 Lower ext arterial Dopplers May 2018 normal. Stress echocardiogram Feb 2019 was normal. CTA June 2019 showed aneurysm of the membranous ventricular septum that bulges into the right ventricular outflow tract.  There is no connection between the left ventricle and right ventricle.  There is no sinus of Valsalva aneurysm.  Calcium score 0.  No coronary disease.  Since last seen she has dyspnea with more vigorous activities but not routine activities.  She has occasional pain in her left breast area that is not related to activities.  Last 10 to 45 minutes.  Resolved spontaneously.  No radiation.  Not pleuritic or positional.  Current Outpatient Medications  Medication Sig Dispense Refill  . 5-Hydroxytryptophan (5-HTP) 50 MG CAPS Take 50 mg by mouth daily.    . Alpha-Lipoic Acid 300 MG TABS Take 600 mg by mouth daily.    . AMINO ACIDS COMPLEX PO Take 1 Scoop by mouth every morning.    Francia Greaves THYROID 30 MG tablet Take 30 mg by mouth daily.  6  . Ascorbic Acid (VITAMIN C PO) Take 4,000 Units by mouth daily.    . bimatoprost (LATISSE) 0.03 % ophthalmic solution Place one drop on applicator and apply evenly along the skin of the eyebrow once daily at bedtime; repeat procedure for second eye (use a clean applicator).    [name] BIOTIN PO Take 20,000 mcg by mouth.    . calcium gluconate 500 MG tablet Take 1 tablet by mouth daily.    . Cholecalciferol (VITAMIN D3) 10000 units capsule Take 20,000 Units by mouth daily.     . Coenzyme Q10 (CO Q 10) 100 MG CAPS Take 200 mg by mouth daily.    .  Cyanocobalamin (VITAMIN B-12 ER PO) Take 2,500 mcg by mouth every morning.    . Digestive Enzymes (DIGESTIVE ENZYME PO) Take 1 tablet by mouth daily.    [name] GLUTATHIONE PO Take 1 capsule by mouth daily.    [name] MAGNESIUM CITRATE PO Take 800 mg by mouth.     . Menaquinone-7 (VITAMIN K2 PO) Take 1 tablet by mouth daily.    . Misc Natural Products (CHLORELLA) 500 MG CAPS Take 500 mg by mouth daily.    . Multiple Vitamin (MULTIVITAMIN WITH MINERALS) TABS Take 1 tablet by mouth daily.    . NON FORMULARY Take 2 tablets by mouth daily. "NT Energy Factor"    . NON FORMULARY Take 3 tablets by mouth daily. "Energy Multi Flex"    . NON FORMULARY Take 1 tablet by mouth daily. "GI Benefits" supplement    . Omega-3 Fatty Acids (FISH OIL) 1000 MG CAPS Take 1,000 mg by mouth daily.    . Probiotic Product (PROBIOTIC DAILY PO) Take 1 capsule by mouth. 1 capsule of 5 billion colonies    . SUPREP BOWEL PREP KIT 17.5-3.13-1.6 GM/177ML SOLN Take 1 kit by mouth as directed. 354 mL 0   No current facility-administered medications for this visit.      Past Medical History:  Diagnosis Date  . Aneurysm of cardiac wall, congenital    a.  septal aneurysm extending into  the RVOT (by cardiac MRI in 08/2013)  . Asthma   . Depression   . Hypothyroidism   . Lyme disease   . MVP (mitral valve prolapse)   . Orthostatic hypotension   . Osteoporosis   . Peripheral neuropathy 06/09/2012  . Plantar fasciitis   . Pneumothorax     Past Surgical History:  Procedure Laterality Date  . COLONOSCOPY WITH PROPOFOL N/A 02/22/2013   Procedure: COLONOSCOPY WITH PROPOFOL;  Surgeon: Garlan Fair, MD;  Location: [facility];  Service: Endoscopy;  Laterality: N/A;  . MOUTH SURGERY  01/2012   bone graft in mouth  . NASAL SINUS SURGERY Right 06/12/2014   Procedure: RIGHT ENDOSCOPIC MAXILLARY ANTROSTOMY;  Surgeon: Ascencion Dike, MD;  Location: [facility];  Service: ENT;  Laterality: Right;  . ROOT CANAL  02/27/2012  .  TONSILLECTOMY  1962    Social History   Socioeconomic History  . Marital status: Divorced    Spouse name: Not on file  . Number of children: 0  . Years of education: Not on file  . Highest education level: Not on file  Occupational History    Comment: Career and life coach  Social Needs  . Financial resource strain: Not on file  . Food insecurity:    Worry: Not on file    Inability: Not on file  . Transportation needs:    Medical: Not on file    Non-medical: Not on file  Tobacco Use  . Smoking status: Never Smoker  . Smokeless tobacco: Never Used  Substance and Sexual Activity  . Alcohol use: No    Alcohol/week: 0.0 oz  . Drug use: No  . Sexual activity: Not on file  Lifestyle  . Physical activity:    Days per week: Not on file    Minutes per session: Not on file  . Stress: Not on file  Relationships  . Social connections:    Talks on phone: Not on file    Gets together: Not on file    Attends religious service: Not on file    Active member of club or organization: Not on file    Attends meetings of clubs or organizations: Not on file    Relationship status: Not on file  . Intimate partner violence:    Fear of current or ex partner: Not on file    Emotionally abused: Not on file    Physically abused: Not on file    Forced sexual activity: Not on file  Other Topics Concern  . Not on file  Social History Narrative   Patient is single and lives alone.   Patient is self-employed, career and life coaching.   Patient drinks two to four cups of caffeine daily.    Family History  Problem Relation Age of Onset  . Hyperlipidemia Father   . Heart failure Father   . Prostate cancer Father   . Kidney failure Father   . Hypertension Father   . Angina Mother   . Hypertension Mother   . Lung cancer Mother   . Colon cancer Mother   . Lung cancer Paternal Grandfather   . Breast cancer Paternal Grandmother   . Diabetes Paternal Grandmother     ROS: no fevers or  chills, productive cough, hemoptysis, dysphasia, odynophagia, melena, hematochezia, dysuria, hematuria, rash, seizure activity, orthopnea, PND, pedal edema, claudication. Remaining systems are negative.  Physical Exam: Well-developed well-nourished in no acute distress.  Skin is warm and dry.  HEENT is  normal.  Neck is supple.  Chest is clear to auscultation with normal expansion.  Cardiovascular exam is regular rate and rhythm.  Abdominal exam nontender or distended. No masses palpated. Extremities show no edema. neuro grossly intact  A/P  1 aneurysmal dilatation of the interventricular septum extending into the right ventricular outflow tract-there was no evidence of VSD and no sinus of Valsalva aneurysm.  No indication for further therapy at this point.  2 chest pain-CTA showed no coronary disease and calcium score of 0.  No plans for further evaluation.  3 orthostatic hypotension-symptoms are recently well controlled.  Continue increased sodium intake and fluid intake.  4 hyperlipidemia-Per primary care.  Kirk Ruths, MD

## 2018-02-11 ENCOUNTER — Encounter: Payer: Self-pay | Admitting: Cardiology

## 2018-02-11 ENCOUNTER — Ambulatory Visit (INDEPENDENT_AMBULATORY_CARE_PROVIDER_SITE_OTHER): Payer: BLUE CROSS/BLUE SHIELD | Admitting: Cardiology

## 2018-02-11 VITALS — BP 90/62 | HR 66 | Ht 69.5 in | Wt 134.0 lb

## 2018-02-11 DIAGNOSIS — E7800 Pure hypercholesterolemia, unspecified: Secondary | ICD-10-CM | POA: Diagnosis not present

## 2018-02-11 DIAGNOSIS — I951 Orthostatic hypotension: Secondary | ICD-10-CM

## 2018-02-11 DIAGNOSIS — R072 Precordial pain: Secondary | ICD-10-CM | POA: Diagnosis not present

## 2018-02-11 NOTE — Patient Instructions (Signed)
Your physician wants you to follow-up in: ONE YEAR WITH DR CRENSHAW You will receive a reminder letter in the mail two months in advance. If you don't receive a letter, please call our office to schedule the follow-up appointment.  

## 2018-02-13 DIAGNOSIS — S73191D Other sprain of right hip, subsequent encounter: Secondary | ICD-10-CM | POA: Diagnosis not present

## 2018-02-13 DIAGNOSIS — M25551 Pain in right hip: Secondary | ICD-10-CM | POA: Diagnosis not present

## 2018-02-16 DIAGNOSIS — F4322 Adjustment disorder with anxiety: Secondary | ICD-10-CM | POA: Diagnosis not present

## 2018-02-18 ENCOUNTER — Other Ambulatory Visit: Payer: Self-pay

## 2018-02-18 ENCOUNTER — Ambulatory Visit (AMBULATORY_SURGERY_CENTER): Payer: Self-pay

## 2018-02-18 VITALS — Ht 69.5 in | Wt 132.2 lb

## 2018-02-18 DIAGNOSIS — Z1211 Encounter for screening for malignant neoplasm of colon: Secondary | ICD-10-CM

## 2018-02-18 DIAGNOSIS — M79651 Pain in right thigh: Secondary | ICD-10-CM | POA: Diagnosis not present

## 2018-02-18 DIAGNOSIS — M25551 Pain in right hip: Secondary | ICD-10-CM | POA: Diagnosis not present

## 2018-02-18 DIAGNOSIS — G609 Hereditary and idiopathic neuropathy, unspecified: Secondary | ICD-10-CM | POA: Diagnosis not present

## 2018-02-18 MED ORDER — NA SULFATE-K SULFATE-MG SULF 17.5-3.13-1.6 GM/177ML PO SOLN: 1.0000 | Freq: Once | ORAL | 0 refills | Status: AC

## 2018-02-18 NOTE — Progress Notes (Signed)
Denies allergies to eggs or soy products. Denies complication of anesthesia or sedation. Denies use of weight loss medication. Denies use of O2.   Emmi instructions declined.   Two day prep using Miralax and Suprep were given to patient. Patient states she has a torturous colon and must have a pediatric scope. I informed the patient that she could remind Dr. Hilarie Fredrickson just before her procedure.

## 2018-02-19 ENCOUNTER — Encounter: Payer: Self-pay | Admitting: Internal Medicine

## 2018-02-19 DIAGNOSIS — G609 Hereditary and idiopathic neuropathy, unspecified: Secondary | ICD-10-CM | POA: Diagnosis not present

## 2018-02-19 DIAGNOSIS — M79651 Pain in right thigh: Secondary | ICD-10-CM | POA: Diagnosis not present

## 2018-02-19 DIAGNOSIS — M25551 Pain in right hip: Secondary | ICD-10-CM | POA: Diagnosis not present

## 2018-02-22 DIAGNOSIS — E7800 Pure hypercholesterolemia, unspecified: Secondary | ICD-10-CM | POA: Diagnosis not present

## 2018-02-26 ENCOUNTER — Ambulatory Visit (INDEPENDENT_AMBULATORY_CARE_PROVIDER_SITE_OTHER): Payer: BLUE CROSS/BLUE SHIELD | Admitting: Family Medicine

## 2018-02-26 ENCOUNTER — Encounter: Payer: Self-pay | Admitting: Family Medicine

## 2018-02-26 DIAGNOSIS — M25551 Pain in right hip: Secondary | ICD-10-CM | POA: Diagnosis not present

## 2018-02-26 NOTE — Patient Instructions (Signed)
Continue with the physical therapy - ask if he can be more hands on (or one of his colleagues) and if any modalities would be appropriate. Continue your home exercises. Avoid injection unless your pain is bad enough that you feel you need this. I'll look at your knee x-rays and let you know Monday but I suspect what Dr. Wynelle Link was telling you is more correct than the guys trying to sell you stem cell treatments. Follow up with me in 6 weeks.

## 2018-02-26 NOTE — Addendum Note (Signed)
Addended by: Truddie Crumble on: 02/26/2018 10:04 AM   Modules accepted: Orders

## 2018-02-27 ENCOUNTER — Encounter: Payer: Self-pay | Admitting: Family Medicine

## 2018-02-27 NOTE — Assessment & Plan Note (Signed)
primarily due to mild arthritis of hip.  Clinically improving with PT and HEP - advised she ask PT about modalities, additional recommendations about advancing her exercises, regular appointments.  Advised against intraarticular cortisone injection unless pain worsens.  F/u in 6 weeks.  Tylenol only if needed.

## 2018-02-27 NOTE — Progress Notes (Signed)
PCP: Lavone Orn, MD  Subjective:   HPI: Patient is a 62 y.o. female here for right hip pain.  5/7: Patient reports for the past 2 weeks she's had anterior right hip pain that is sharp and up to 5-6/10 level. Has had off and on problems for 5 years but this is more constant and severe. No numbness or tingling. Goes into lateral knee and some posteriorly. Does a yoga class. Using a topical hemp cream but no medications. No back pain. No bowel/bladder dysfunction.  7/5: Patient reports she's overall improved since last visit. Pain level down to 3/10 mainly anterior right hip. Went to physical therapy and was given home exercise program she's been doing regularly as directed but got the sense therapist felt she did not need regular visits or the modalities. She is sleeping better. Pain still goes down to the right knee but sometimes lateral, sometimes medial. She saw Dr. Wynelle Link who recommended intraarticular cortisone injection but she has declined with improvement in pain. He did x-rays of her knee and stated pain from knee was coming from hip, no evidence arthritis of the knee. She also consulted with a stem cell group as well. No numbness. No back pain.  Past Medical History:  Diagnosis Date  . Allergy   . Aneurysm of cardiac wall, congenital    a.  septal aneurysm extending into the RVOT (by cardiac MRI in 08/2013)  . Anxiety   . Arthritis   . Asthma   . Depression   . Heart murmur   . Hypothyroidism   . Lyme disease   . MVP (mitral valve prolapse)   . Orthostatic hypotension   . Osteoporosis   . Peripheral neuropathy 06/09/2012  . Plantar fasciitis   . Pneumothorax     Current Outpatient Medications on File Prior to Visit  Medication Sig Dispense Refill  . 5-Hydroxytryptophan (5-HTP) 50 MG CAPS Take 50 mg by mouth daily.    . Alpha-Lipoic Acid 300 MG TABS Take 600 mg by mouth daily.    . AMINO ACIDS COMPLEX PO Take 1 Scoop by mouth every morning.    Francia Greaves  THYROID 30 MG tablet Take 30 mg by mouth daily.  6  . Ascorbic Acid (VITAMIN C PO) Take 4,000 Units by mouth daily.    [name] BIOTIN PO Take 20,000 mcg by mouth.    . calcium gluconate 500 MG tablet Take 1 tablet by mouth daily.    . Cholecalciferol (VITAMIN D3) 10000 units capsule Take 20,000 Units by mouth daily.     . Coenzyme Q10 (CO Q 10) 100 MG CAPS Take 200 mg by mouth daily.    . Cyanocobalamin (VITAMIN B-12 ER PO) Take 2,500 mcg by mouth every morning.    . Digestive Enzymes (DIGESTIVE ENZYME PO) Take 1 tablet by mouth daily.    [name] GLUTATHIONE PO Take 1 capsule by mouth daily.    [name] MAGNESIUM CITRATE PO Take 800 mg by mouth.     . Menaquinone-7 (VITAMIN K2 PO) Take 1 tablet by mouth daily.    . Misc Natural Products (CHLORELLA) 500 MG CAPS Take 500 mg by mouth daily.    . NON FORMULARY Take 2 tablets by mouth daily. "NT Energy Factor"    . NON FORMULARY Take 3 tablets by mouth daily. "Energy Multi Flex"    . NON FORMULARY Take 1 tablet by mouth daily. "GI Benefits" supplement    . Omega-3 Fatty Acids (FISH OIL) 1000 MG CAPS Take 1,000 mg  by mouth daily.    . Probiotic Product (PROBIOTIC DAILY PO) Take 1 capsule by mouth. 1 capsule of 5 billion colonies     No current facility-administered medications on file prior to visit.     Past Surgical History:  Procedure Laterality Date  . COLONOSCOPY WITH PROPOFOL N/A 02/22/2013   Procedure: COLONOSCOPY WITH PROPOFOL;  Surgeon: Garlan Fair, MD;  Location: [facility];  Service: Endoscopy;  Laterality: N/A;  . MOUTH SURGERY  01/2012   bone graft in mouth  . NASAL SINUS SURGERY Right 06/12/2014   Procedure: RIGHT ENDOSCOPIC MAXILLARY ANTROSTOMY;  Surgeon: Ascencion Dike, MD;  Location: [facility];  Service: ENT;  Laterality: Right;  . ROOT CANAL  02/27/2012  . TONSILLECTOMY  1962    No Known Allergies  Social History   Socioeconomic History  . Marital status: Divorced    Spouse name: Not on file  . Number of children: 0   . Years of education: Not on file  . Highest education level: Not on file  Occupational History    Comment: Career and life coach  Social Needs  . Financial resource strain: Not on file  . Food insecurity:    Worry: Not on file    Inability: Not on file  . Transportation needs:    Medical: Not on file    Non-medical: Not on file  Tobacco Use  . Smoking status: Never Smoker  . Smokeless tobacco: Never Used  Substance and Sexual Activity  . Alcohol use: No    Alcohol/week: 0.0 oz  . Drug use: No  . Sexual activity: Not on file  Lifestyle  . Physical activity:    Days per week: Not on file    Minutes per session: Not on file  . Stress: Not on file  Relationships  . Social connections:    Talks on phone: Not on file    Gets together: Not on file    Attends religious service: Not on file    Active member of club or organization: Not on file    Attends meetings of clubs or organizations: Not on file    Relationship status: Not on file  . Intimate partner violence:    Fear of current or ex partner: Not on file    Emotionally abused: Not on file    Physically abused: Not on file    Forced sexual activity: Not on file  Other Topics Concern  . Not on file  Social History Narrative   Patient is single and lives alone.   Patient is self-employed, career and life coaching.   Patient drinks two to four cups of caffeine daily.    Family History  Problem Relation Age of Onset  . Hyperlipidemia Father   . Heart failure Father   . Prostate cancer Father   . Kidney failure Father   . Hypertension Father   . Angina Mother   . Hypertension Mother   . Lung cancer Mother   . Colon cancer Mother   . Lung cancer Paternal Grandfather   . Breast cancer Paternal Grandmother   . Diabetes Paternal Grandmother   . Esophageal cancer Neg Hx   . Liver cancer Neg Hx   . Pancreatic cancer Neg Hx   . Rectal cancer Neg Hx   . Stomach cancer Neg Hx     BP 100/67   Pulse 60   Ht 5\' 10"   (1.778 m)   Wt 132 lb (59.9 kg)  BMI 18.94 kg/m   Review of Systems: See HPI above.     Objective:  Physical Exam:  Gen: NAD, comfortable in exam room  Back: No gross deformity, scoliosis. No TTP.  No midline or bony TTP. FROM. Strength LEs 5/5 all muscle groups.   2+ MSRs in patellar and achilles tendons, equal bilaterally. Negative SLRs. Sensation intact to light touch bilaterally.  Right hip: No deformity. Mild limitation IR with 5/5 strength, no pain currently. No TTP currently. NVI distally. Positive logroll lying down, negative seated.   Negative fabers, piriformis.   Assessment & Plan:  1. Right hip pain - primarily due to mild arthritis of hip.  Clinically improving with PT and HEP - advised she ask PT about modalities, additional recommendations about advancing her exercises, regular appointments.  Advised against intraarticular cortisone injection unless pain worsens.  F/u in 6 weeks.  Tylenol only if needed.

## 2018-03-01 ENCOUNTER — Encounter: Payer: Self-pay | Admitting: Family Medicine

## 2018-03-02 DIAGNOSIS — F4322 Adjustment disorder with anxiety: Secondary | ICD-10-CM | POA: Diagnosis not present

## 2018-03-04 ENCOUNTER — Encounter: Payer: Self-pay | Admitting: Internal Medicine

## 2018-03-04 ENCOUNTER — Ambulatory Visit (AMBULATORY_SURGERY_CENTER): Payer: BLUE CROSS/BLUE SHIELD | Admitting: Internal Medicine

## 2018-03-04 ENCOUNTER — Other Ambulatory Visit: Payer: Self-pay

## 2018-03-04 ENCOUNTER — Telehealth: Payer: Self-pay

## 2018-03-04 VITALS — BP 96/48 | HR 74 | Temp 98.9°F | Resp 13 | Ht 69.0 in | Wt 132.0 lb

## 2018-03-04 DIAGNOSIS — B370 Candidal stomatitis: Secondary | ICD-10-CM

## 2018-03-04 DIAGNOSIS — Z1211 Encounter for screening for malignant neoplasm of colon: Secondary | ICD-10-CM

## 2018-03-04 DIAGNOSIS — Z800 Family history of malignant neoplasm of digestive organs: Secondary | ICD-10-CM

## 2018-03-04 MED ORDER — DIFLUCAN 100 MG PO TABS: 100.0000 mg | ORAL_TABLET | Freq: Every day | ORAL | 0 refills | Status: AC

## 2018-03-04 MED ORDER — SODIUM CHLORIDE 0.9 % IV SOLN: 500.0000 mL | Freq: Once | INTRAVENOUS | Status: DC

## 2018-03-04 NOTE — Progress Notes (Signed)
Pt to recovery, VSS, A&O, pleased with mac. Report to RN

## 2018-03-04 NOTE — Patient Instructions (Signed)
YOU HAD AN ENDOSCOPIC PROCEDURE TODAY AT THE [facility]:   Refer to the procedure report that was given to you for any specific questions about what was found during the examination.  If the procedure report does not answer your questions, please call your gastroenterologist to clarify.  If you requested that your care partner not be given the details of your procedure findings, then the procedure report has been included in a sealed envelope for you to review at your convenience later.  YOU SHOULD EXPECT: Some feelings of bloating in the abdomen. Passage of more gas than usual.  Walking can help get rid of the air that was put into your GI tract during the procedure and reduce the bloating. If you had a lower endoscopy (such as a colonoscopy or flexible sigmoidoscopy) you may notice spotting of blood in your stool or on the toilet paper. If you underwent a bowel prep for your procedure, you may not have a normal bowel movement for a few days.  Please Note:  You might notice some irritation and congestion in your nose or some drainage.  This is from the oxygen used during your procedure.  There is no need for concern and it should clear up in a day or so.  SYMPTOMS TO REPORT IMMEDIATELY:   Following lower endoscopy (colonoscopy or flexible sigmoidoscopy):  Excessive amounts of blood in the stool  Significant tenderness or worsening of abdominal pains  Swelling of the abdomen that is new, acute  Fever of 100F or higher  For urgent or emergent issues, a gastroenterologist can be reached at any hour by calling [phone].   DIET:  We do recommend a small meal at first, but then you may proceed to your regular diet.  Drink plenty of fluids but you should avoid alcoholic beverages for 24 hours.  ACTIVITY:  You should plan to take it easy for the rest of today and you should NOT DRIVE or use heavy machinery until tomorrow (because of the sedation medicines used during the test).     FOLLOW UP: Our staff will call the number listed on your records the next business day following your procedure to check on you and address any questions or concerns that you may have regarding the information given to you following your procedure. If we do not reach you, we will leave a message.  However, if you are feeling well and you are not experiencing any problems, there is no need to return our call.  We will assume that you have returned to your regular daily activities without incident.  If any biopsies were taken you will be contacted by phone or by letter within the next 1-3 weeks.  Please call us at [phone] if you have not heard about the biopsies in 3 weeks.    SIGNATURES/CONFIDENTIALITY: You and/or your care partner have signed paperwork which will be entered into your electronic medical record.  These signatures attest to the fact that that the information above on your After Visit Summary has been reviewed and is understood.  Full responsibility of the confidentiality of this discharge information lies with you and/or your care-partner. 

## 2018-03-04 NOTE — Progress Notes (Signed)
Pt. Reports no change in her medical or surgical history since her pre-visit 02/18/2018.

## 2018-03-04 NOTE — Op Note (Signed)
[facility] Patient Name: Megan [name] Procedure Date: 03/04/2018 1:39 PM MRN: [id number] Endoscopist: Jerene Bears , MD Age: 62 Referring MD:  Date of Birth: May 03, 1956 Gender: Female Account #: [id number] Procedure:                Colonoscopy Indications:              Screening in patient at increased risk: Family                            history of 1st-degree relative with colorectal                            cancer, Last colonoscopy 5 years ago Medicines:                Monitored Anesthesia Care Procedure:                Pre-Anesthesia Assessment:                           - Prior to the procedure, a History and Physical                            was performed, and patient medications and                            allergies were reviewed. The patient's tolerance of                            previous anesthesia was also reviewed. The risks                            and benefits of the procedure and the sedation                            options and risks were discussed with the patient.                            All questions were answered, and informed consent                            was obtained. Prior Anticoagulants: The patient has                            taken no previous anticoagulant or antiplatelet                            agents. ASA Grade Assessment: II - A patient with                            mild systemic disease. After reviewing the risks                            and benefits, the patient was deemed in  satisfactory condition to undergo the procedure.                           After obtaining informed consent, the colonoscope                            was passed under direct vision. Throughout the                            procedure, the patient's blood pressure, pulse, and                            oxygen saturations were monitored continuously. The                            Colonoscope was introduced  through the anus and                            advanced to the cecum, identified by appendiceal                            orifice and ileocecal valve. The colonoscopy was                            somewhat difficult due to significant looping and a                            tortuous colon. Successful completion of the                            procedure was aided by applying abdominal pressure.                            The patient tolerated the procedure well. The                            quality of the bowel preparation was good. Scope In: 1:47:56 PM Scope Out: 2:09:19 PM Scope Withdrawal Time: 0 hours 12 minutes 21 seconds  Total Procedure Duration: 0 hours 21 minutes 23 seconds  Findings:                 The digital rectal exam was normal.                           The entire examined colon appeared normal on direct                            and retroflexion views. Complications:            No immediate complications. Estimated Blood Loss:     Estimated blood loss: none. Impression:               - The entire examined colon is normal on direct and  retroflexion views.                           - No specimens collected. Recommendation:           - Patient has a contact number available for                            emergencies. The signs and symptoms of potential                            delayed complications were discussed with the                            patient. Return to normal activities tomorrow.                            Written discharge instructions were provided to the                            patient.                           - Resume previous diet.                           - Continue present medications.                           - Repeat colonoscopy in 5 years for screening                            purposes.                           - Fluconazole 200 mg x 1 day, 100 mg x 9 days for                            persistent,  recurrent oral Candidiasis. Jerene Bears, MD 03/04/2018 2:13:57 PM This report has been signed electronically.

## 2018-03-04 NOTE — Telephone Encounter (Signed)
Called Patient about her concerns of not being cleaned out for her procedure this afternoon. She did the double prep as directed and sounds like she is right on track for her procedure today. She started off with brown stool as normal and it is clearing up as it should be. She was concerned that she has drank the second dose and the water and has not gone to the bathroom yet and feels "girgling" in her stomach. I reassured her that this was normal and that she will start going again and as long as she could 'see through' it she would be fine.

## 2018-03-04 NOTE — Progress Notes (Signed)
Follow up appt made for patient on 9/19 at 1445 with Dr. Hilarie Fredrickson.

## 2018-03-05 ENCOUNTER — Telehealth: Payer: Self-pay | Admitting: *Deleted

## 2018-03-05 NOTE — Telephone Encounter (Signed)
  Follow up Call-  Call back number 03/04/2018  Post procedure Call Back phone  # [phone]  Permission to leave phone message Yes  Some recent data might be hidden     Patient questions:  Do you have a fever, pain , or abdominal swelling? No. Pain Score  0 *  Have you tolerated food without any problems? Yes.    Have you been able to return to your normal activities? Yes.    Do you have any questions about your discharge instructions: Diet   No. Medications  No. Follow up visit  No.  Do you have questions or concerns about your Care? No.  Actions: * If pain score is 4 or above: No action needed, pain <4.

## 2018-03-06 DIAGNOSIS — M25551 Pain in right hip: Secondary | ICD-10-CM | POA: Diagnosis not present

## 2018-03-06 DIAGNOSIS — S73191D Other sprain of right hip, subsequent encounter: Secondary | ICD-10-CM | POA: Diagnosis not present

## 2018-03-08 DIAGNOSIS — G629 Polyneuropathy, unspecified: Secondary | ICD-10-CM | POA: Diagnosis not present

## 2018-03-08 DIAGNOSIS — H5789 Other specified disorders of eye and adnexa: Secondary | ICD-10-CM | POA: Diagnosis not present

## 2018-03-08 DIAGNOSIS — M792 Neuralgia and neuritis, unspecified: Secondary | ICD-10-CM | POA: Diagnosis not present

## 2018-03-08 DIAGNOSIS — D890 Polyclonal hypergammaglobulinemia: Secondary | ICD-10-CM | POA: Diagnosis not present

## 2018-03-08 DIAGNOSIS — I7381 Erythromelalgia: Secondary | ICD-10-CM | POA: Diagnosis not present

## 2018-03-11 DIAGNOSIS — M62830 Muscle spasm of back: Secondary | ICD-10-CM | POA: Diagnosis not present

## 2018-03-11 DIAGNOSIS — M9902 Segmental and somatic dysfunction of thoracic region: Secondary | ICD-10-CM | POA: Diagnosis not present

## 2018-03-11 DIAGNOSIS — M9903 Segmental and somatic dysfunction of lumbar region: Secondary | ICD-10-CM | POA: Diagnosis not present

## 2018-03-11 DIAGNOSIS — M9905 Segmental and somatic dysfunction of pelvic region: Secondary | ICD-10-CM | POA: Diagnosis not present

## 2018-03-17 DIAGNOSIS — F4322 Adjustment disorder with anxiety: Secondary | ICD-10-CM | POA: Diagnosis not present

## 2018-03-18 DIAGNOSIS — M62830 Muscle spasm of back: Secondary | ICD-10-CM | POA: Diagnosis not present

## 2018-03-18 DIAGNOSIS — M9902 Segmental and somatic dysfunction of thoracic region: Secondary | ICD-10-CM | POA: Diagnosis not present

## 2018-03-18 DIAGNOSIS — M9903 Segmental and somatic dysfunction of lumbar region: Secondary | ICD-10-CM | POA: Diagnosis not present

## 2018-03-18 DIAGNOSIS — M9905 Segmental and somatic dysfunction of pelvic region: Secondary | ICD-10-CM | POA: Diagnosis not present

## 2018-03-19 DIAGNOSIS — M79651 Pain in right thigh: Secondary | ICD-10-CM | POA: Diagnosis not present

## 2018-03-19 DIAGNOSIS — G609 Hereditary and idiopathic neuropathy, unspecified: Secondary | ICD-10-CM | POA: Diagnosis not present

## 2018-03-19 DIAGNOSIS — M25551 Pain in right hip: Secondary | ICD-10-CM | POA: Diagnosis not present

## 2018-03-20 ENCOUNTER — Emergency Department (HOSPITAL_COMMUNITY)
Admission: EM | Admit: 2018-03-20 | Discharge: 2018-03-21 | Disposition: A | Discharge status: Home / Self Care | Payer: BLUE CROSS/BLUE SHIELD | Attending: Emergency Medicine | Admitting: Emergency Medicine

## 2018-03-20 ENCOUNTER — Emergency Department (HOSPITAL_COMMUNITY): Payer: BLUE CROSS/BLUE SHIELD

## 2018-03-20 ENCOUNTER — Encounter (HOSPITAL_COMMUNITY): Payer: Self-pay | Admitting: Emergency Medicine

## 2018-03-20 DIAGNOSIS — E039 Hypothyroidism, unspecified: Secondary | ICD-10-CM | POA: Diagnosis not present

## 2018-03-20 DIAGNOSIS — Z79899 Other long term (current) drug therapy: Secondary | ICD-10-CM | POA: Insufficient documentation

## 2018-03-20 DIAGNOSIS — J45909 Unspecified asthma, uncomplicated: Secondary | ICD-10-CM | POA: Diagnosis not present

## 2018-03-20 DIAGNOSIS — R0789 Other chest pain: Secondary | ICD-10-CM | POA: Diagnosis not present

## 2018-03-20 DIAGNOSIS — I10 Essential (primary) hypertension: Secondary | ICD-10-CM | POA: Diagnosis not present

## 2018-03-20 DIAGNOSIS — R0602 Shortness of breath: Secondary | ICD-10-CM | POA: Diagnosis not present

## 2018-03-20 DIAGNOSIS — R079 Chest pain, unspecified: Secondary | ICD-10-CM | POA: Diagnosis not present

## 2018-03-20 LAB — BASIC METABOLIC PANEL
Anion gap: 12 (ref 5–15)
BUN: 11 mg/dL (ref 8–23)
CO2: 27 mmol/L (ref 22–32)
Calcium: 9.4 mg/dL (ref 8.9–10.3)
Chloride: 102 mmol/L (ref 98–111)
Creatinine, Ser: 0.69 mg/dL (ref 0.44–1.00)
GFR calc Af Amer: >60 mL/min (ref >60)
GFR calc non Af Amer: >60 mL/min (ref >60)
Glucose, Bld: 120 mg/dL — ABNORMAL HIGH (ref 70–99)
Potassium: 4.3 mmol/L (ref 3.5–5.1)
Sodium: 141 mmol/L (ref 135–145)

## 2018-03-20 LAB — CBC
HCT: 39.8 % (ref 36.0–46.0)
Hemoglobin: 12.7 g/dL (ref 12.0–15.0)
MCH: 29.8 pg (ref 26.0–34.0)
MCHC: 31.9 g/dL (ref 30.0–36.0)
MCV: 93.4 fL (ref 78.0–100.0)
Platelets: 207 10*3/uL (ref 150–400)
RBC: 4.26 MIL/uL (ref 3.87–5.11)
RDW: 12.5 % (ref 11.5–15.5)
WBC: 3.8 10*3/uL — ABNORMAL LOW (ref 4.0–10.5)

## 2018-03-20 LAB — I-STAT TROPONIN, ED: Troponin i, poc: 0 ng/mL (ref 0.00–0.08)

## 2018-03-20 NOTE — ED Triage Notes (Signed)
Pt from home.  About 4 hours ago began having "heaviness" in her chest and then progressively intermittent chest "pressure" that is worse with exertion. No n/v. No diaphoresis. Endorses SHOB.  Pt took 325 ASA.  2/10 pain. No nitro given. 20g LFA.  120/64, 70, RR 18, 98% on RA

## 2018-03-21 LAB — I-STAT TROPONIN, ED: Troponin i, poc: 0 ng/mL (ref 0.00–0.08)

## 2018-03-21 NOTE — ED Provider Notes (Signed)
[facility] EMERGENCY DEPARTMENT Provider Note  CSN: [id number] Arrival date & time: 03/20/18 2209  Chief Complaint(s) Chest Pain  HPI Megan [name] is a 62 y.o. female   Developed "intense sensation" throughout her chest that gradually grew throughout the day and then she developed chest pain at 4pm.  The history is provided by the patient.  Chest Pain   This is a new problem. Episode onset: 10 hrs. Episode frequency: intermittent. The problem has been resolved. The pain is present in the substernal region. The pain is moderate. Quality: "deep ache" The pain does not radiate. Exacerbated by: nothing. Pertinent negatives include no back pain, no cough, no fever, no leg pain, no lower extremity edema, no shortness of breath and no syncope.  Pertinent negatives for past medical history include no CAD, no diabetes, no DVT, no hyperlipidemia, no hypertension, no MI, no PE, no strokes and no TIA.   Patient reports that she has been taking this supplements.  Several days ago she started taking a nitric supplement which includes ATP and it.  She wonders whether this is a side effect from the supplement.  Past Medical History Past Medical History:  Diagnosis Date  . Allergy   . Aneurysm of cardiac wall, congenital    a.  septal aneurysm extending into the RVOT (by cardiac MRI in 08/2013)  . Anxiety   . Arthritis   . Asthma   . Depression   . Heart murmur   . Hypothyroidism   . Lyme disease   . MVP (mitral valve prolapse)   . Orthostatic hypotension   . Osteoporosis   . Peripheral neuropathy 06/09/2012  . Plantar fasciitis   . Pneumothorax    Patient Active Problem List   Diagnosis Date Noted  . Hypogammaglobulinemia ([place]) 01/11/2018  . Right hip pain 12/30/2017  . Bilateral ankle pain 11/09/2017  . Hypothyroidism 10/27/2017  . Hair loss 04/25/2017  . Neuropathic pain of left lower extremity 04/22/2017  . Great toe pain, left 04/10/2017  . Precordial chest  pain 12/17/2016  . Aneurysm of right ventricle of heart 12/17/2016  . Chronic fatigue 03/05/2016  . Neuropathy associated with MGUS ([place]) 12/26/2013  . Orthostatic hypotension 08/01/2013  . Mitral valve prolapse 08/01/2013  . Spider veins of limb 02/15/2013  . Leukopenia 05/13/2012  . Bilateral Dorsal Foot pain 04/23/2011  . Gait abnormality 04/23/2011  . Leg length inequality 04/23/2011  . ASTHMA 11/16/2008  . Osteoporosis 10/19/2008  . Nonspecific (abnormal) findings on radiological and other examination of body structure 07/24/2008  . ABNORMAL CHEST XRAY 07/24/2008   Home Medication(s) Prior to Admission medications   Medication Sig Start Date End Date Taking? Authorizing Provider  5-Hydroxytryptophan (5-HTP) 50 MG CAPS Take 50 mg by mouth daily.   Yes [provider]  Alpha-Lipoic Acid 300 MG TABS Take 600 mg by mouth daily.   Yes [provider]  AMINO ACIDS COMPLEX PO Take 1 Scoop by mouth daily.    Yes [provider]  ARMOUR THYROID 30 MG tablet Take 30 mg by mouth daily. 11/20/16  Yes [provider]  Ascorbic Acid (VITAMIN C PO) Take 4,000 Units by mouth daily.   Yes [provider]  BIOTIN PO Take 20,000 mcg by mouth daily.    Yes [provider]  calcium gluconate 500 MG tablet Take 1 tablet by mouth daily.   Yes [provider]  Cholecalciferol (VITAMIN D3) 10000 units capsule Take 20,000 Units by mouth daily.  Yes [provider]  Coenzyme Q10 (CO Q 10) 100 MG CAPS Take 200 mg by mouth daily.   Yes [provider]  Cyanocobalamin (VITAMIN B-12 ER PO) Take 2,500 mcg by mouth daily.    Yes [provider]  Digestive Enzymes (DIGESTIVE ENZYME PO) Take 1 tablet by mouth daily.   Yes [provider]  GLUTATHIONE PO Take 1 capsule by mouth daily.   Yes [provider]  MAGNESIUM CITRATE PO Take 800 mg by mouth daily.    Yes [provider]  Menaquinone-7  (VITAMIN K2 PO) Take 1 tablet by mouth daily.   Yes [provider]  Misc Natural Products (CHLORELLA) 500 MG CAPS Take 500 mg by mouth daily.   Yes [provider]  NON FORMULARY Take 2 tablets by mouth daily. "NT Energy Factor"   Yes [provider]  NON FORMULARY Take 3 tablets by mouth daily. "Energy Multi Flex"   Yes [provider]  Omega-3 Fatty Acids (FISH OIL) 1000 MG CAPS Take 1,000 mg by mouth daily.   Yes [provider]  OVER THE COUNTER MEDICATION Take 5 mLs by mouth daily. Nitric Balance (K-68)   Yes [provider]  Probiotic Product (PROBIOTIC DAILY PO) Take 1 capsule by mouth daily.    Yes [provider]                                                                                                                                    Past Surgical History Past Surgical History:  Procedure Laterality Date  . COLONOSCOPY WITH PROPOFOL N/A 02/22/2013   Procedure: COLONOSCOPY WITH PROPOFOL;  Surgeon: Garlan Fair, MD;  Location: [facility];  Service: Endoscopy;  Laterality: N/A;  . MOUTH SURGERY  01/2012   bone graft in mouth  . NASAL SINUS SURGERY Right 06/12/2014   Procedure: RIGHT ENDOSCOPIC MAXILLARY ANTROSTOMY;  Surgeon: Ascencion Dike, MD;  Location: [place];  Service: ENT;  Laterality: Right;  . ROOT CANAL  02/27/2012  . TONSILLECTOMY  1962   Family History Family History  Problem Relation Age of Onset  . Hyperlipidemia Father   . Heart failure Father   . Prostate cancer Father   . Kidney failure Father   . Hypertension Father   . Angina Mother   . Hypertension Mother   . Lung cancer Mother   . Colon cancer Mother   . Lung cancer Paternal Grandfather   . Breast cancer Paternal Grandmother   . Diabetes Paternal Grandmother   . Esophageal cancer Neg Hx   . Liver cancer Neg Hx   . Pancreatic cancer Neg Hx   . Rectal cancer Neg Hx   . Stomach cancer Neg Hx     Social  History Social History   Tobacco Use  . Smoking status: Never Smoker  . Smokeless tobacco: Never Used  Substance Use Topics  .  Alcohol use: No    Alcohol/week: 0.0 oz  . Drug use: No   Allergies Patient has no known allergies.  Review of Systems Review of Systems  Constitutional: Negative for fever.  Respiratory: Negative for cough and shortness of breath.   Cardiovascular: Positive for chest pain. Negative for syncope.  Musculoskeletal: Negative for back pain.   All other systems are reviewed and are negative for acute change except as noted in the HPI  Physical Exam Vital Signs  I have reviewed the triage vital signs BP 106/65 (BP Location: Right Arm)   Pulse 66   Temp 98.1 F (36.7 C) (Oral)   Resp 14   SpO2 99%   Physical Exam  Constitutional: She is oriented to person, place, and time. She appears well-developed and well-nourished. No distress.  HENT:  Head: Normocephalic and atraumatic.  Nose: Nose normal.  Eyes: Pupils are equal, round, and reactive to light. Conjunctivae and EOM are normal. Right eye exhibits no discharge. Left eye exhibits no discharge. No scleral icterus.  Neck: Normal range of motion. Neck supple.  Cardiovascular: Normal rate and regular rhythm. Exam reveals no gallop and no friction rub.  No murmur heard. Pulmonary/Chest: Effort normal and breath sounds normal. No stridor. No respiratory distress. She has no rales.  Abdominal: Soft. She exhibits no distension. There is no tenderness.  Musculoskeletal: She exhibits no edema or tenderness.  Neurological: She is alert and oriented to person, place, and time.  Skin: Skin is warm and dry. No rash noted. She is not diaphoretic. No erythema.  Psychiatric: She has a normal mood and affect.  Vitals reviewed.   ED Results and Treatments Labs (all labs ordered are listed, but only abnormal results are displayed) Labs Reviewed  BASIC METABOLIC PANEL - Abnormal; Notable for the following  components:      Result Value   Glucose, Bld 120 (*)    All other components within normal limits  CBC - Abnormal; Notable for the following components:   WBC 3.8 (*)    All other components within normal limits  I-STAT TROPONIN, ED  I-STAT TROPONIN, ED                                                                                                                         EKG  EKG Interpretation  Date/Time:  Saturday March 20 2018 22:12:51 EDT Ventricular Rate:  75 PR Interval:  166 QRS Duration: 86 QT Interval:  400 QTC Calculation: 446 R Axis:   86 Text Interpretation:  Normal sinus rhythm Normal ECG No significant change since last tracing Confirmed by Addison Lank [phone]) on 03/21/2018 1:32:23 AM      Radiology Dg Chest 2 View  Result Date: 03/20/2018 CLINICAL DATA:  Chest pain/pressure. EXAM: CHEST - 2 VIEW COMPARISON:  Chest radiograph 08/08/2016.  Coronary CT 01/28/2018 FINDINGS: The cardiomediastinal contours are normal. The lungs are clear. Pulmonary vasculature is normal. No consolidation, pleural effusion, or pneumothorax. No acute osseous abnormalities  are seen. IMPRESSION: Unremarkable radiographs of the chest. Electronically Signed   By: Jeb Levering M.D.   On: 03/20/2018 22:38   Pertinent labs & imaging results that were available during my care of the patient were reviewed by me and considered in my medical decision making (see chart for details).  Medications Ordered in ED Medications - No data to display                                                                                                                                  Procedures Procedures  (including critical care time)  Medical Decision Making / ED Course I have reviewed the nursing notes for this encounter and the patient's prior records (if available in EHR or on provided paperwork).      Record review: CT coronary 6/62019: IMPRESSION: 1. Aneurysm of the membranous ventricular septum  bulging into the RVOT, there does not appear to be a an actual VSD. No sinus of Valsalva aneurysm.  2. Coronary artery calcium score 0 Agatston units, suggesting low risk for future cardiac events.  3.  No plaque or stenosis noted in the coronary tree.   Atypical chest pain highly inconsistent with ACS.  EKG without acute ischemic changes or evidence of pericarditis.  Initial troponin negative.  Heart score less than 4.  Appropriate for delta troponin.   Low suspicion for pulmonary embolism.  Not classic for aortic dissection or esophageal perforation.  Chest x-ray without evidence suggestive of pneumonia, pneumothorax, pneumomediastinum.  No abnormal contour of the mediastinum to suggest dissection. No evidence of acute injuries.  Delta troponin negative.  The patient appears reasonably screened and/or stabilized for discharge and I doubt any other medical condition or other [facility] requiring further screening, evaluation, or treatment in the ED at this time prior to discharge.  The patient is safe for discharge with strict return precautions.   Final Clinical Impression(s) / ED Diagnoses Final diagnoses:  Atypical chest pain    Disposition: Discharge  Condition: Good  I have discussed the results, Dx and Tx plan with the patient who expressed understanding and agree(s) with the plan. Discharge instructions discussed at great length. The patient was given strict return precautions who verbalized understanding of the instructions. No further questions at time of discharge.    ED Discharge Orders    None       Follow Up: Lavone Orn, MD [address] [place] [address] [phone]  Schedule an appointment as soon as possible for a visit  As needed     This chart was dictated using voice recognition software.  Despite best efforts to proofread,  errors can occur which can change the documentation meaning.   Fatima Blank, MD 03/21/18  0400

## 2018-03-23 DIAGNOSIS — E039 Hypothyroidism, unspecified: Secondary | ICD-10-CM | POA: Diagnosis not present

## 2018-03-23 DIAGNOSIS — F419 Anxiety disorder, unspecified: Secondary | ICD-10-CM | POA: Diagnosis not present

## 2018-03-23 DIAGNOSIS — R079 Chest pain, unspecified: Secondary | ICD-10-CM | POA: Diagnosis not present

## 2018-03-24 DIAGNOSIS — H16223 Keratoconjunctivitis sicca, not specified as Sjogren's, bilateral: Secondary | ICD-10-CM | POA: Diagnosis not present

## 2018-03-24 DIAGNOSIS — H25813 Combined forms of age-related cataract, bilateral: Secondary | ICD-10-CM | POA: Diagnosis not present

## 2018-03-29 DIAGNOSIS — E280 Estrogen excess: Secondary | ICD-10-CM | POA: Diagnosis not present

## 2018-03-29 DIAGNOSIS — E039 Hypothyroidism, unspecified: Secondary | ICD-10-CM | POA: Diagnosis not present

## 2018-03-29 DIAGNOSIS — M9905 Segmental and somatic dysfunction of pelvic region: Secondary | ICD-10-CM | POA: Diagnosis not present

## 2018-03-29 DIAGNOSIS — M62830 Muscle spasm of back: Secondary | ICD-10-CM | POA: Diagnosis not present

## 2018-03-29 DIAGNOSIS — M9902 Segmental and somatic dysfunction of thoracic region: Secondary | ICD-10-CM | POA: Diagnosis not present

## 2018-03-29 DIAGNOSIS — M9903 Segmental and somatic dysfunction of lumbar region: Secondary | ICD-10-CM | POA: Diagnosis not present

## 2018-03-31 DIAGNOSIS — Z713 Dietary counseling and surveillance: Secondary | ICD-10-CM | POA: Diagnosis not present

## 2018-04-02 DIAGNOSIS — S73191D Other sprain of right hip, subsequent encounter: Secondary | ICD-10-CM | POA: Diagnosis not present

## 2018-04-02 DIAGNOSIS — G609 Hereditary and idiopathic neuropathy, unspecified: Secondary | ICD-10-CM | POA: Diagnosis not present

## 2018-04-02 DIAGNOSIS — M79651 Pain in right thigh: Secondary | ICD-10-CM | POA: Diagnosis not present

## 2018-04-02 DIAGNOSIS — M25551 Pain in right hip: Secondary | ICD-10-CM | POA: Diagnosis not present

## 2018-04-07 DIAGNOSIS — F4322 Adjustment disorder with anxiety: Secondary | ICD-10-CM | POA: Diagnosis not present

## 2018-04-09 ENCOUNTER — Ambulatory Visit (INDEPENDENT_AMBULATORY_CARE_PROVIDER_SITE_OTHER): Payer: BLUE CROSS/BLUE SHIELD | Admitting: Family Medicine

## 2018-04-09 ENCOUNTER — Encounter: Payer: Self-pay | Admitting: Family Medicine

## 2018-04-09 VITALS — BP 103/65 | HR 77 | Ht 70.0 in | Wt 132.0 lb

## 2018-04-09 DIAGNOSIS — M25511 Pain in right shoulder: Secondary | ICD-10-CM

## 2018-04-09 DIAGNOSIS — M25551 Pain in right hip: Secondary | ICD-10-CM | POA: Diagnosis not present

## 2018-04-09 NOTE — Patient Instructions (Signed)
You have rotator cuff impingement Try to avoid painful activities (overhead activities, lifting with extended arm) as much as possible. Aleve 2 tabs twice a day with food OR ibuprofen 3 tabs three times a day with food for pain and inflammation - take for 7-10 days then as needed. Can take tylenol in addition to this. Subacromial injection may be beneficial to help with pain and to decrease inflammation but is only for short term benefit. Consider physical therapy with transition to home exercise program. Do home exercise program with theraband and scapular stabilization exercises daily 3 sets of 10 once a day - start with yellow, advance to red theraband. If not improving at follow-up we will consider imaging, injection, physical therapy, and/or nitro patches. Follow up with me in 4-6 weeks.

## 2018-04-12 ENCOUNTER — Ambulatory Visit (INDEPENDENT_AMBULATORY_CARE_PROVIDER_SITE_OTHER): Payer: BLUE CROSS/BLUE SHIELD | Admitting: Internal Medicine

## 2018-04-12 ENCOUNTER — Encounter: Payer: Self-pay | Admitting: Family Medicine

## 2018-04-12 ENCOUNTER — Encounter: Payer: Self-pay | Admitting: Internal Medicine

## 2018-04-12 VITALS — BP 96/60 | HR 65 | Ht 70.0 in | Wt 132.0 lb

## 2018-04-12 DIAGNOSIS — E039 Hypothyroidism, unspecified: Secondary | ICD-10-CM | POA: Diagnosis not present

## 2018-04-12 DIAGNOSIS — M810 Age-related osteoporosis without current pathological fracture: Secondary | ICD-10-CM

## 2018-04-12 DIAGNOSIS — R7989 Other specified abnormal findings of blood chemistry: Secondary | ICD-10-CM

## 2018-04-12 DIAGNOSIS — F4322 Adjustment disorder with anxiety: Secondary | ICD-10-CM | POA: Diagnosis not present

## 2018-04-12 NOTE — Progress Notes (Signed)
Patient ID: Megan [name], female   DOB: 15-Dec-1955, 62 y.o.   MRN: [id number]    HPI  [name] is a 62 y.o.-year-old female, initially referred by her OB/GYN doctor, Dr. Ronita Hipps, returning for follow-up for osteoporosis and hypothyroidism.  Last visit 5.5 months ago.  She recently was seen by rheumatologist for generalized pain, especially in her neck and shoulders.  Investigation was negative.  She also went to the emergency room since last visit for chest pain after she started a natural supplement (nitric balance-ATP), which she stopped since.  Pt was dx with osteoporosis in 2006, but she had lower BMD even before menopause in 2004.  Since last visit, she saw an osteoporosis specialist and she had another bone density scan, but she does not have the results with her.  She tells me that the results were essentially similar to the ones we obtained in 07/2017.  She continues to follow with him and she will have another bone density scan in his office next year.  He tells me that he strongly encouraged her to start osteoporotic medicines, but she would like to wait until the next visit to have another bone density scan before deciding.  Reviewed patient's DXA scan report: Date L1-L4 T score FN T score 33% distal Radius  08/13/2017 -3.3 (-4.4%*) RFN: -2.0 LFN: -1.8 n/a  08/10/2015 -3.0 RFN: -2.0 LFN: -2.2 n/a   Per records brought by patient: L1-L4:  06/2013: -3.5  10/2007: -3.1  07/2005: -2.5  05/2003: -2.2  RFN:  06/2013: -2.2  10/2007: -2.0  07/2005: -2.1  05/2003: -1.8  LFN:  06/2013: -2.2  10/2007: -2.1  07/2005: -2.2  05/2003: -1.9  She had one fracture: - 07/2016: Right rib  No dizziness/vertigo/orthostasis/poor vision.  No falls.  Previous osteoporotic treatments:  - Fosamax and Actonel - 2001-2004 (no help) - Estradiol 0.0125 mg + Progesterone - 2014-2016 (improvement in spine BMD)  No h/o vitamin D deficiency. Reviewed available vit D  levels: 03/29/2018: Vitamin D 82.5 09/2017: Vitamin D 72 06/17/2016: Vitamin D 35 No results found for: VD25OH   She stopped calcium 500 mg daily since last visit, as she is drinking enough Almond milk.  She uses my fitness pal phone app to calculate if she is getting enough calcium and iron. she continues taking 2000 units vitamin D 5 days a week. Taking vitamin K2 - 90 mcg with it.  + spin class, yoga, walking 3 miles 5 days a week before she got sick in 2010 >> now less exercise due to generalized pain.  She went to [facility] >> got hurt - also had a HA that lasted 3 weeks.   No history of increased steroid use.  She had 2 short courses in her life.  In 2018, she ruled out for multiple myeloma by protein electrophoresis. She was investigated for MS, but the investigation was inconclusive.  Menopause was at 62 y/o (2004).   Pt does have a FH of osteoporosis: M - had spinal fx's, father - Lupron.  She is seeing [facility] >> saw him 3x in the past >> now sees another functional medicine dr. She was told she had mold in her house, low WBC, tested positive for heavy metals, iron is low. She feels much better than when she started seeing him.  No hyper/hypocalcemia or hyperparathyroidism. No h/o kidney stones. 03/29/2018: PTH 48 Lab Results  Component Value Date   CALCIUM 9.4 03/20/2018   CALCIUM 9.2 01/19/2018   CALCIUM 9.2  12/30/2017   CALCIUM 9.5 11/04/2017   CALCIUM 9.8 08/08/2016   CALCIUM 9.3 03/05/2016   CALCIUM 9.5 03/03/2016   CALCIUM 9.0 03/25/2015   CALCIUM 9.8 12/26/2013   CALCIUM 9.6 06/15/2013   No history of CKD. Last BUN/Cr: Lab Results  Component Value Date   BUN 11 03/20/2018   CREATININE 0.69 03/20/2018   Hypothyroidism.  She was initially on Nature-Throid, then had to switch to Armour due to [employer]. She noticed hair loss after the switch, but no other new symptoms.  Reviewed TSH recent levels:  03/29/2018: TSH 3.17, free T4 0.75, free T3  3.0 02/10/2018: TSH 1.89, free T4 0.9, free T3 2.2 Component     Latest Ref Rng & Units 11/04/2017  Triiodothyronine,Free,Serum     2.3 - 4.2 pg/mL 5.2 (H)  T4,Free(Direct)     0.60 - 1.60 ng/dL 0.59 (L)  TSH     0.35 - 4.50 uIU/mL 2.74   08/14/2017: TSH 1.780, fT4 1.08 - pt was on Biotin 10,000 mcg when labs drawn; Selenium and Urinary iodine normal Lab Results  Component Value Date   TSH 6.760  07/19/2008   Pt is on Armour 30 mg (equivalent to 50 mcg levothyroxine) daily, taken: - in am - fasting - No coffee - at least 30 min from b'fast - stopped MVI b/c high B6 - stopped Calcium - no  Fe, PPIs - stopped Biotin 5000 mcg 2 weeks ago  She is also multiple supplements, to include alpha lipoic acid, CoQ10, 5 HTP, vitamin B12, probiotics, magnesium, etc. We stopped her vitamin A at last visit.  She stopped the supplement since last visit and also her multivitamins due to elevated B6 vitamin level.  At last check, on 02/10/2018, her vitamin B6 was 10.9 (2.0-32.8), normal.  She was previously vegan, more recently started to eat seafood, eggs and some animal proteins.  Elevated estradiol:  She had previously undetectable estradiol levels in 07/25/2013, 08/21/2014, 05/05/2016, 06/03/2016, however, in 04/25/2017, her level was 44.3 pg/mL.  At that time, she was giving estriol to her dog without gloves.  She started to use gloves and her level became undetectable again.  She then started to reuse the same gloves and in 03/23/2018, the level was 197.8 pg/mL.  She started to use gloves only once for administration and her level decreased to 101.8 on 03/29/2018.  She is seeing Dr.Taavon, and he will check a transvaginal ultrasound in the next few days.  ROS: Constitutional: no weight gain/no weight loss, no fatigue, + hot flashes, no subjective hypothermia Eyes: + blurry vision, no xerophthalmia ENT: no sore throat, no nodules palpated in throat, no dysphagia, no odynophagia, no  hoarseness Cardiovascular: no CP/no SOB/no palpitations/no leg swelling Respiratory: no cough/no SOB/no wheezing Gastrointestinal: no N/no V/no D/no C/no acid reflux Musculoskeletal: + muscle aches/+ joint aches Skin: no rashes, + hair loss Neurological: no tremors/no numbness/no tingling/no dizziness  I reviewed pt's medications, allergies, PMH, social hx, family hx, and changes were documented in the history of present illness. Otherwise, unchanged from my initial visit note.  Past Medical History:  Diagnosis Date  . Allergy   . Aneurysm of cardiac wall, congenital    a.  septal aneurysm extending into the RVOT (by cardiac MRI in 08/2013)  . Anxiety   . Arthritis   . Asthma   . Depression   . Heart murmur   . Hypothyroidism   . Lyme disease   . MVP (mitral valve prolapse)   . Orthostatic  hypotension   . Osteoporosis   . Peripheral neuropathy 06/09/2012  . Plantar fasciitis   . Pneumothorax    Past Surgical History:  Procedure Laterality Date  . COLONOSCOPY WITH PROPOFOL N/A 02/22/2013   Procedure: COLONOSCOPY WITH PROPOFOL;  Surgeon: Garlan Fair, MD;  Location: [facility];  Service: Endoscopy;  Laterality: N/A;  . MOUTH SURGERY  01/2012   bone graft in mouth  . NASAL SINUS SURGERY Right 06/12/2014   Procedure: RIGHT ENDOSCOPIC MAXILLARY ANTROSTOMY;  Surgeon: Ascencion Dike, MD;  Location: [facility];  Service: ENT;  Laterality: Right;  . ROOT CANAL  02/27/2012  . TONSILLECTOMY  1962   Social History   Socioeconomic History  . Marital status: Divorced    Spouse name: Not on file  . Number of children: 0  . Years of education: Not on file  . Highest education level: Not on file  Occupational History    Comment: Career and life coach  Social Needs  . Financial resource strain: Not on file  . Food insecurity:    Worry: Not on file    Inability: Not on file  . Transportation needs:    Medical: Not on file    Non-medical: Not on file  Tobacco Use   . Smoking status: Never Smoker  . Smokeless tobacco: Never Used  Substance and Sexual Activity  . Alcohol use: No    Alcohol/week: 0.0 standard drinks  . Drug use: No  . Sexual activity: Not on file  Lifestyle  . Physical activity:    Days per week: Not on file    Minutes per session: Not on file  . Stress: Not on file  Relationships  . Social connections:    Talks on phone: Not on file    Gets together: Not on file    Attends religious service: Not on file    Active member of club or organization: Not on file    Attends meetings of clubs or organizations: Not on file    Relationship status: Not on file  . Intimate partner violence:    Fear of current or ex partner: Not on file    Emotionally abused: Not on file    Physically abused: Not on file    Forced sexual activity: Not on file  Other Topics Concern  . Not on file  Social History Narrative   Patient is single and lives alone.   Patient is self-employed, career and life coaching.   Patient drinks two to four cups of caffeine daily.   Current Outpatient Medications on File Prior to Visit  Medication Sig Dispense Refill  . 5-Hydroxytryptophan (5-HTP) 50 MG CAPS Take 50 mg by mouth daily.    . Alpha-Lipoic Acid 300 MG TABS Take 600 mg by mouth daily.    . AMINO ACIDS COMPLEX PO Take 1 Scoop by mouth daily.     Francia Greaves THYROID 30 MG tablet Take 30 mg by mouth daily.  6  . Ascorbic Acid (VITAMIN C PO) Take 4,000 Units by mouth daily.    [name] BIOTIN PO Take 20,000 mcg by mouth daily.     . calcium gluconate 500 MG tablet Take 1 tablet by mouth daily.    . Cholecalciferol (VITAMIN D3) 10000 units capsule Take 20,000 Units by mouth daily.     . Coenzyme Q10 (CO Q 10) 100 MG CAPS Take 200 mg by mouth daily.    . Cyanocobalamin (VITAMIN B-12 ER PO) Take 2,500 mcg by  mouth daily.     . Digestive Enzymes (DIGESTIVE ENZYME PO) Take 1 tablet by mouth daily.    [name] GLUTATHIONE PO Take 1 capsule by mouth daily.    [name] MAGNESIUM CITRATE  PO Take 800 mg by mouth daily.     . Menaquinone-7 (VITAMIN K2 PO) Take 1 tablet by mouth daily.    . Misc Natural Products (CHLORELLA) 500 MG CAPS Take 500 mg by mouth daily.    . NON FORMULARY Take 2 tablets by mouth daily. "NT Energy Factor"    . NON FORMULARY Take 3 tablets by mouth daily. "Energy Multi Flex"    . Omega-3 Fatty Acids (FISH OIL) 1000 MG CAPS Take 1,000 mg by mouth daily.    [name] OVER THE COUNTER MEDICATION Take 5 mLs by mouth daily. Nitric Balance (K-68)    . Probiotic Product (PROBIOTIC DAILY PO) Take 1 capsule by mouth daily.      Current Facility-Administered Medications on File Prior to Visit  Medication Dose Route Frequency Provider Last Rate Last Dose  . 0.9 %  sodium chloride infusion  500 mL Intravenous Once Pyrtle, [name], MD       No Known Allergies Family History  Problem Relation Age of Onset  . Hyperlipidemia Father   . Heart failure Father   . Prostate cancer Father   . Kidney failure Father   . Hypertension Father   . Angina Mother   . Hypertension Mother   . Lung cancer Mother   . Colon cancer Mother   . Lung cancer Paternal Grandfather   . Breast cancer Paternal Grandmother   . Diabetes Paternal Grandmother   . Esophageal cancer Neg Hx   . Liver cancer Neg Hx   . Pancreatic cancer Neg Hx   . Rectal cancer Neg Hx   . Stomach cancer Neg Hx     PE: BP 96/60   Pulse 65   Ht '5\' 10"'  (1.778 m)   Wt 132 lb (59.9 kg)   SpO2 98%   BMI 18.94 kg/m  Wt Readings from Last 3 Encounters:  04/12/18 132 lb (59.9 kg)  04/09/18 132 lb (59.9 kg)  03/21/18 132 lb (59.9 kg)   Constitutional: Normal weight, in NAD, no kyphosis Eyes: PERRLA, EOMI, no exophthalmos ENT: moist mucous membranes, no thyromegaly, no cervical lymphadenopathy Cardiovascular: RRR, No MRG Respiratory: CTA B Gastrointestinal: abdomen soft, NT, ND, BS+ Musculoskeletal: no deformities, strength intact in all 4 Skin: moist, warm, no rashes Neurological: no tremor with outstretched  hands, DTR normal in all 4  Assessment: 1. Osteoporosis  2.  Hypothyroidism  3.  Elevated estrogen  Other treating physicians:  Dr. Ronita Hipps  Dr. August Luz [facility] [place] [address]  Plan: 1. Osteoporosis - Likely age-related/postmenopausal, and she also family history of osteoporosis; she was drinking a lot of coffee before, now stopped -she feels this could have contributed. - Reviewing her latest DEXA scan report, she is at an increased risk for fractures.  Her T-scores are better at the level of her hips, compared with her lumbar spine. - Reviewed her calcium and vitamin D supplements.  Since last visit, she stopped calcium after our discussion, but she continued her vitamin D.  She is also taking vitamin K2, which enhances the effect of her vitamin D supplement.  She also drinks Almond milk.  At last visit, we stopped her vitamin A, as I explained that an excess of this vitamin can lead to osteoporosis. -  she did go to [facility] - for skeletal loading, but pushed herself too hard >> but she developed muscle spasms in a 3-week headache - She does not drink or smoke - At last visit, I did recommend that we started medicines to improve her bone density.  We discussed about the different medication classes, benefits and side effects.  At last visit, she was undergoing dental work, but this is now completed.  At last visit, she was not open to start any of the medicines, either antiresorptives or supporting bone formation.  She was only open to start estrogen, but I am not comfortable to prescribe this for her, more than 10 years after menopause.  She opted to try to adjust her diet (low acid) and increase her resistance exercises pending a new bone density scan a year from the previous. - Since last visit, she saw an osteoporosis specialist and had another bone density scan, which was essentially similar to the one that she had on 07/2017.  He we will  continue to follow her and repeat of DEXA scan a year after the previous.  She will then decide whether or not to start antiresorptive therapy. - I will see her back in 10/2018  2. Hypothyroidism - She had no clear diagnosis of hypothyroidism when she started thyroid hormone replacement.  She was started on medication based on symptoms.  Since her TFTs remained normal afterwards, we continued with her low-dose supplementation. - latest thyroid labs reviewed with pt >> TSH normal, with a TSH slightly higher than before, but still well in the normal range. - she continues on Armour 30 mg daily - pt feels good on this dose, with exception of cold intolerance, constipation, fatigue, which could be attributed to hypothyroidism - we discussed about taking the thyroid hormone every day, with water, >30 minutes before breakfast, separated by >4 hours from acid reflux medications, calcium, iron, multivitamins. Pt. is taking it correctly. - will check thyroid tests in 2 months: TSH, free T3 and fT4  3.  Elevated estrogen - Patient brings records of her recently elevated estradiol level.  This was 197.8 pg/mL (repeat level in few days, decreased to 101.8), previously undetectable.  However, in 04/2017, she had another elevated estradiol level, at 44.3 pg/mL, which was most likely due to administering estriol to her dog without gloves.  The level became undetectable when she started to use gloves, however, she started to reuse the gloves afterwards, and I believe this was probably the reason for her recently elevated estradiol.  She has transvaginal ultrasound scheduled by Dr. Ronita Hipps soon.  We will continue to follow her for this. - Her progesterone level was very low, as expected, at 0.2 ng/mL.  Upon her questioning, I advised her not to start taking a progesterone supplement.  - time spent with the patient: 40 minutes, of which >50% was spent in obtaining information about her symptoms, reviewing her previous  labs, evaluations, and treatments, counseling her about her conditions (please see the discussed topics above), and developing a plan to further investigate and treat them; she had a number of questions which I addressed.    Philemon Kingdom, MD PhD [facility]

## 2018-04-12 NOTE — Patient Instructions (Signed)
Please come back for thyroid labs in 2 months. Stop Biotin 1 week before the labs.  Please come back for a follow-up appointment in 6 months

## 2018-04-13 DIAGNOSIS — M9902 Segmental and somatic dysfunction of thoracic region: Secondary | ICD-10-CM | POA: Diagnosis not present

## 2018-04-13 DIAGNOSIS — M9903 Segmental and somatic dysfunction of lumbar region: Secondary | ICD-10-CM | POA: Diagnosis not present

## 2018-04-13 DIAGNOSIS — E280 Estrogen excess: Secondary | ICD-10-CM | POA: Diagnosis not present

## 2018-04-13 DIAGNOSIS — M62830 Muscle spasm of back: Secondary | ICD-10-CM | POA: Diagnosis not present

## 2018-04-13 DIAGNOSIS — M9905 Segmental and somatic dysfunction of pelvic region: Secondary | ICD-10-CM | POA: Diagnosis not present

## 2018-04-14 DIAGNOSIS — E280 Estrogen excess: Secondary | ICD-10-CM | POA: Diagnosis not present

## 2018-04-14 DIAGNOSIS — M810 Age-related osteoporosis without current pathological fracture: Secondary | ICD-10-CM | POA: Diagnosis not present

## 2018-04-14 DIAGNOSIS — R5383 Other fatigue: Secondary | ICD-10-CM | POA: Diagnosis not present

## 2018-04-14 DIAGNOSIS — Z77120 Contact with and (suspected) exposure to mold (toxic): Secondary | ICD-10-CM | POA: Diagnosis not present

## 2018-04-14 DIAGNOSIS — E039 Hypothyroidism, unspecified: Secondary | ICD-10-CM | POA: Diagnosis not present

## 2018-04-14 DIAGNOSIS — G609 Hereditary and idiopathic neuropathy, unspecified: Secondary | ICD-10-CM | POA: Diagnosis not present

## 2018-04-14 NOTE — Progress Notes (Signed)
PCP: Lavone Orn, MD  Subjective:   HPI: Patient is a 62 y.o. female here for right hip pain, right shoulder pain.  5/7: Patient reports for the past 2 weeks she's had anterior right hip pain that is sharp and up to 5-6/10 level. Has had off and on problems for 5 years but this is more constant and severe. No numbness or tingling. Goes into lateral knee and some posteriorly. Does a yoga class. Using a topical hemp cream but no medications. No back pain. No bowel/bladder dysfunction.  7/5: Patient reports she's overall improved since last visit. Pain level down to 3/10 mainly anterior right hip. Went to physical therapy and was given home exercise program she's been doing regularly as directed but got the sense therapist felt she did not need regular visits or the modalities. She is sleeping better. Pain still goes down to the right knee but sometimes lateral, sometimes medial. She saw Dr. Wynelle Link who recommended intraarticular cortisone injection but she has declined with improvement in pain. He did x-rays of her knee and stated pain from knee was coming from hip, no evidence arthritis of the knee. She also consulted with a stem cell group as well. No numbness. No back pain.  8/16: Patient reports her right hip is doing better and now a 1 out of 10 level of pain. She is worked with Mickie Kay and Barbaraann Barthel, doing her home exercises regularly. New issue is for about 3 months she has had right lateral upper arm pain with weakness. She stopped doing upper extremity exercises and planks due to pain. Said trigger point massage. She describes the pain as a 4 out of 10 level with some associated burning. No numbness no radiation past the elbow. Worse laying on her right side and she does have night pain. No skin changes.  Past Medical History:  Diagnosis Date  . Allergy   . Aneurysm of cardiac wall, congenital    a.  septal aneurysm extending into the RVOT (by cardiac  MRI in 08/2013)  . Anxiety   . Arthritis   . Asthma   . Depression   . Heart murmur   . Hypothyroidism   . Lyme disease   . MVP (mitral valve prolapse)   . Orthostatic hypotension   . Osteoporosis   . Peripheral neuropathy 06/09/2012  . Plantar fasciitis   . Pneumothorax     Current Outpatient Medications on File Prior to Visit  Medication Sig Dispense Refill  . 5-Hydroxytryptophan (5-HTP) 50 MG CAPS Take 50 mg by mouth daily.    . Alpha-Lipoic Acid 300 MG TABS Take 600 mg by mouth daily.    . AMINO ACIDS COMPLEX PO Take 1 Scoop by mouth daily.     Francia Greaves THYROID 30 MG tablet Take 30 mg by mouth daily.  6  . Ascorbic Acid (VITAMIN C PO) Take 4,000 Units by mouth daily.    [name] BIOTIN PO Take 20,000 mcg by mouth daily.     . calcium gluconate 500 MG tablet Take 1 tablet by mouth daily.    . Cholecalciferol (VITAMIN D3) 10000 units capsule Take 20,000 Units by mouth daily.     . Coenzyme Q10 (CO Q 10) 100 MG CAPS Take 200 mg by mouth daily.    . Cyanocobalamin (VITAMIN B-12 ER PO) Take 2,500 mcg by mouth daily.     . Digestive Enzymes (DIGESTIVE ENZYME PO) Take 1 tablet by mouth daily.    [name] GLUTATHIONE PO Take 1  capsule by mouth daily.    [name] MAGNESIUM CITRATE PO Take 800 mg by mouth daily.     . Menaquinone-7 (VITAMIN K2 PO) Take 1 tablet by mouth daily.    . Misc Natural Products (CHLORELLA) 500 MG CAPS Take 500 mg by mouth daily.    . NON FORMULARY Take 2 tablets by mouth daily. "NT Energy Factor"    . NON FORMULARY Take 3 tablets by mouth daily. "Energy Multi Flex"    . Omega-3 Fatty Acids (FISH OIL) 1000 MG CAPS Take 1,000 mg by mouth daily.    [name] OVER THE COUNTER MEDICATION Take 5 mLs by mouth daily. Nitric Balance (K-68)    . Probiotic Product (PROBIOTIC DAILY PO) Take 1 capsule by mouth daily.      Current Facility-Administered Medications on File Prior to Visit  Medication Dose Route Frequency Provider Last Rate Last Dose  . 0.9 %  sodium chloride infusion  500 mL  Intravenous Once Pyrtle, [name], MD        Past Surgical History:  Procedure Laterality Date  . COLONOSCOPY WITH PROPOFOL N/A 02/22/2013   Procedure: COLONOSCOPY WITH PROPOFOL;  Surgeon: Garlan Fair, MD;  Location: [facility];  Service: Endoscopy;  Laterality: N/A;  . MOUTH SURGERY  01/2012   bone graft in mouth  . NASAL SINUS SURGERY Right 06/12/2014   Procedure: RIGHT ENDOSCOPIC MAXILLARY ANTROSTOMY;  Surgeon: Ascencion Dike, MD;  Location: [place];  Service: ENT;  Laterality: Right;  . ROOT CANAL  02/27/2012  . TONSILLECTOMY  1962    No Known Allergies  Social History   Socioeconomic History  . Marital status: Divorced    Spouse name: Not on file  . Number of children: 0  . Years of education: Not on file  . Highest education level: Not on file  Occupational History    Comment: Career and life coach  Social Needs  . Financial resource strain: Not on file  . Food insecurity:    Worry: Not on file    Inability: Not on file  . Transportation needs:    Medical: Not on file    Non-medical: Not on file  Tobacco Use  . Smoking status: Never Smoker  . Smokeless tobacco: Never Used  Substance and Sexual Activity  . Alcohol use: No    Alcohol/week: 0.0 standard drinks  . Drug use: No  . Sexual activity: Not on file  Lifestyle  . Physical activity:    Days per week: Not on file    Minutes per session: Not on file  . Stress: Not on file  Relationships  . Social connections:    Talks on phone: Not on file    Gets together: Not on file    Attends religious service: Not on file    Active member of club or organization: Not on file    Attends meetings of clubs or organizations: Not on file    Relationship status: Not on file  . Intimate partner violence:    Fear of current or ex partner: Not on file    Emotionally abused: Not on file    Physically abused: Not on file    Forced sexual activity: Not on file  Other Topics Concern  . Not on file  Social  History Narrative   Patient is single and lives alone.   Patient is self-employed, career and life coaching.   Patient drinks two to four cups of caffeine daily.    Family  History  Problem Relation Age of Onset  . Hyperlipidemia Father   . Heart failure Father   . Prostate cancer Father   . Kidney failure Father   . Hypertension Father   . Angina Mother   . Hypertension Mother   . Lung cancer Mother   . Colon cancer Mother   . Lung cancer Paternal Grandfather   . Breast cancer Paternal Grandmother   . Diabetes Paternal Grandmother   . Esophageal cancer Neg Hx   . Liver cancer Neg Hx   . Pancreatic cancer Neg Hx   . Rectal cancer Neg Hx   . Stomach cancer Neg Hx     BP 103/65   Pulse 77   Ht 5\' 10"  (1.778 m)   Wt 132 lb (59.9 kg)   BMI 18.94 kg/m   Review of Systems: See HPI above.     Objective:  Physical Exam:  Gen: NAD, comfortable in exam room  Right hip: No deformity. Mild limitation IR with 5/5 strength. No tenderness to palpation. NVI distally. Negative logroll currently.  Right shoulder: No swelling, ecchymoses.  No gross deformity. No TTP. FROM. Positive Hawkins, Neers. Negative Yergasons. Strength 5/5 with empty can and resisted internal/external rotation.  Pain empty can. Negative apprehension. NV intact distally.  Left shoulder: No swelling, ecchymoses.  No gross deformity. No TTP. FROM. Strength 5/5 with empty can and resisted internal/external rotation. NV intact distally.   Assessment & Plan:  1. Right shoulder pain -secondary to rotator cuff impingement.  Reviewed home exercise program for her to do today with Thera-Band and scapular stabilization exercises.  Aleve or ibuprofen for 7 to 10 days then as needed.  We discussed possibility of physical therapy, injection, nitro patches for this as well.  Follow-up in 4 to 6 weeks.  2. Right hip pain -continues to improve, secondary to mild arthritis and hip.  Continue with the home  exercises.  Aleve or ibuprofen as noted above.  Tylenol if needed.

## 2018-04-15 DIAGNOSIS — F4322 Adjustment disorder with anxiety: Secondary | ICD-10-CM | POA: Diagnosis not present

## 2018-04-16 DIAGNOSIS — M9902 Segmental and somatic dysfunction of thoracic region: Secondary | ICD-10-CM | POA: Diagnosis not present

## 2018-04-16 DIAGNOSIS — M62830 Muscle spasm of back: Secondary | ICD-10-CM | POA: Diagnosis not present

## 2018-04-16 DIAGNOSIS — M9905 Segmental and somatic dysfunction of pelvic region: Secondary | ICD-10-CM | POA: Diagnosis not present

## 2018-04-16 DIAGNOSIS — M9903 Segmental and somatic dysfunction of lumbar region: Secondary | ICD-10-CM | POA: Diagnosis not present

## 2018-04-21 LAB — TSH: TSH: 5.6 (ref 0.41–5.90)

## 2018-04-22 DIAGNOSIS — F4322 Adjustment disorder with anxiety: Secondary | ICD-10-CM | POA: Diagnosis not present

## 2018-04-23 DIAGNOSIS — E039 Hypothyroidism, unspecified: Secondary | ICD-10-CM | POA: Diagnosis not present

## 2018-04-23 DIAGNOSIS — G609 Hereditary and idiopathic neuropathy, unspecified: Secondary | ICD-10-CM | POA: Diagnosis not present

## 2018-04-23 DIAGNOSIS — M79651 Pain in right thigh: Secondary | ICD-10-CM | POA: Diagnosis not present

## 2018-04-23 DIAGNOSIS — Z713 Dietary counseling and surveillance: Secondary | ICD-10-CM | POA: Diagnosis not present

## 2018-04-23 DIAGNOSIS — E280 Estrogen excess: Secondary | ICD-10-CM | POA: Diagnosis not present

## 2018-04-23 DIAGNOSIS — M25551 Pain in right hip: Secondary | ICD-10-CM | POA: Diagnosis not present

## 2018-04-23 DIAGNOSIS — M810 Age-related osteoporosis without current pathological fracture: Secondary | ICD-10-CM | POA: Diagnosis not present

## 2018-04-23 DIAGNOSIS — R5383 Other fatigue: Secondary | ICD-10-CM | POA: Diagnosis not present

## 2018-04-23 DIAGNOSIS — Z77120 Contact with and (suspected) exposure to mold (toxic): Secondary | ICD-10-CM | POA: Diagnosis not present

## 2018-04-27 DIAGNOSIS — E039 Hypothyroidism, unspecified: Secondary | ICD-10-CM | POA: Diagnosis not present

## 2018-04-27 DIAGNOSIS — E280 Estrogen excess: Secondary | ICD-10-CM | POA: Diagnosis not present

## 2018-04-27 DIAGNOSIS — Z77120 Contact with and (suspected) exposure to mold (toxic): Secondary | ICD-10-CM | POA: Diagnosis not present

## 2018-04-27 DIAGNOSIS — M810 Age-related osteoporosis without current pathological fracture: Secondary | ICD-10-CM | POA: Diagnosis not present

## 2018-04-27 DIAGNOSIS — G609 Hereditary and idiopathic neuropathy, unspecified: Secondary | ICD-10-CM | POA: Diagnosis not present

## 2018-04-28 DIAGNOSIS — E039 Hypothyroidism, unspecified: Secondary | ICD-10-CM | POA: Diagnosis not present

## 2018-04-28 DIAGNOSIS — F4322 Adjustment disorder with anxiety: Secondary | ICD-10-CM | POA: Diagnosis not present

## 2018-04-28 DIAGNOSIS — E280 Estrogen excess: Secondary | ICD-10-CM | POA: Diagnosis not present

## 2018-04-29 ENCOUNTER — Encounter: Payer: Self-pay | Admitting: Internal Medicine

## 2018-04-30 ENCOUNTER — Other Ambulatory Visit: Payer: Self-pay | Admitting: Internal Medicine

## 2018-04-30 ENCOUNTER — Encounter: Payer: Self-pay | Admitting: Internal Medicine

## 2018-04-30 ENCOUNTER — Ambulatory Visit: Payer: BLUE CROSS/BLUE SHIELD | Admitting: Family Medicine

## 2018-04-30 DIAGNOSIS — R7989 Other specified abnormal findings of blood chemistry: Secondary | ICD-10-CM

## 2018-04-30 NOTE — Progress Notes (Signed)
Megan Few, MD/thx dmf

## 2018-05-04 ENCOUNTER — Other Ambulatory Visit: Payer: Self-pay | Admitting: Internal Medicine

## 2018-05-04 ENCOUNTER — Telehealth: Payer: Self-pay | Admitting: Internal Medicine

## 2018-05-04 ENCOUNTER — Ambulatory Visit (INDEPENDENT_AMBULATORY_CARE_PROVIDER_SITE_OTHER): Payer: BLUE CROSS/BLUE SHIELD | Admitting: Family Medicine

## 2018-05-04 ENCOUNTER — Encounter

## 2018-05-04 ENCOUNTER — Encounter: Payer: Self-pay | Admitting: Family Medicine

## 2018-05-04 VITALS — BP 97/59 | HR 63 | Ht 70.0 in | Wt 130.0 lb

## 2018-05-04 DIAGNOSIS — M25511 Pain in right shoulder: Secondary | ICD-10-CM

## 2018-05-04 DIAGNOSIS — F4322 Adjustment disorder with anxiety: Secondary | ICD-10-CM | POA: Diagnosis not present

## 2018-05-04 MED ORDER — METHOCARBAMOL 500 MG PO TABS: 500.0000 mg | ORAL_TABLET | Freq: Three times a day (TID) | ORAL | 1 refills | Status: DC | PRN

## 2018-05-04 NOTE — Patient Instructions (Signed)
Your history and exam are consistent with very early frozen shoulder (adhesive capsulitis), a buildup of scar tissue that limits motion of the shoulder joint. You also have spasms/strain of your cervical paraspinal muscles, trapezius, rhomboids. Limit lifting and overhead activities as much as possible. Heat 15 minutes at a time 3-4 times a day may help with movement and stiffness. Aleve 2 tabs twice a day with food for pain and inflammation. Robaxin as needed for spasms. Codman exercises (pendulum, wall walking or table slides, arm circles) - do 3 sets of 10 once or twice a day. Work with Jenny Reichmann and Lavell Anchors for the neck. Follow up in 1 month

## 2018-05-04 NOTE — Telephone Encounter (Signed)
[place] [facility] is needing a PA CT scan she is having done sept 16th (Monday)    [place]

## 2018-05-05 ENCOUNTER — Encounter: Payer: Self-pay | Admitting: Internal Medicine

## 2018-05-05 ENCOUNTER — Telehealth: Payer: Self-pay | Admitting: Emergency Medicine

## 2018-05-05 DIAGNOSIS — Z713 Dietary counseling and surveillance: Secondary | ICD-10-CM | POA: Diagnosis not present

## 2018-05-05 NOTE — Telephone Encounter (Signed)
Called patient back. She stated she had them drawn the beginning of September and by the time she has her labs drawn it will be about 6 wks past the first time it was checked. She is wondering if that will be ok? She doesn't want to get stuck twice. Thanks.

## 2018-05-05 NOTE — Telephone Encounter (Signed)
We can check it here, however, I would not check it sooner than at least 1 to 2 months after the last check.  I think she had this at the beginning of September.Megan KitchenMarland [name]

## 2018-05-05 NOTE — Telephone Encounter (Signed)
Pt called and wants to know when she come is in October to have her labs drawn can she also have a draw for estradiol. She asked that somebody call her back with an answer so if not she can have another doctor draw it thanks.

## 2018-05-06 ENCOUNTER — Other Ambulatory Visit: Payer: Self-pay | Admitting: Internal Medicine

## 2018-05-06 ENCOUNTER — Telehealth: Payer: Self-pay | Admitting: Internal Medicine

## 2018-05-06 DIAGNOSIS — R7989 Other specified abnormal findings of blood chemistry: Secondary | ICD-10-CM

## 2018-05-06 DIAGNOSIS — R5382 Chronic fatigue, unspecified: Secondary | ICD-10-CM | POA: Diagnosis not present

## 2018-05-06 DIAGNOSIS — M62830 Muscle spasm of back: Secondary | ICD-10-CM | POA: Diagnosis not present

## 2018-05-06 DIAGNOSIS — M9905 Segmental and somatic dysfunction of pelvic region: Secondary | ICD-10-CM | POA: Diagnosis not present

## 2018-05-06 DIAGNOSIS — M9902 Segmental and somatic dysfunction of thoracic region: Secondary | ICD-10-CM | POA: Diagnosis not present

## 2018-05-06 DIAGNOSIS — R202 Paresthesia of skin: Secondary | ICD-10-CM | POA: Diagnosis not present

## 2018-05-06 DIAGNOSIS — M9903 Segmental and somatic dysfunction of lumbar region: Secondary | ICD-10-CM | POA: Diagnosis not present

## 2018-05-06 NOTE — Telephone Encounter (Signed)
Pt called back and informed of labs.

## 2018-05-06 NOTE — Telephone Encounter (Signed)
Handled in separate phone call per Roselyn Reef

## 2018-05-06 NOTE — Telephone Encounter (Signed)
Patient returning call. Please call ph# [phone] (there until 1:45 pm and after 3:30 pm today)

## 2018-05-06 NOTE — Telephone Encounter (Signed)
Yes, sure, just asked her to tell Kieth Brightly to draw both the thyroid and the estradiol level.

## 2018-05-06 NOTE — Telephone Encounter (Signed)
Called patient and left VM

## 2018-05-07 ENCOUNTER — Telehealth: Payer: Self-pay

## 2018-05-07 NOTE — Telephone Encounter (Signed)
Called [employer] to do pre-cert for for abdominal scan- the patient did not meet the medical necessity according to the clinica questions asked- [employer] is requesting a peer to peer with MD to discus this today since the procedure is scheduled for 05/10/18- please call [phone] at you earliest conveince

## 2018-05-07 NOTE — Telephone Encounter (Signed)
She decided to wait with this until we can check her Estradiol level again in 1.5 mo. So we may not need it.

## 2018-05-07 NOTE — Telephone Encounter (Signed)
Called and left VM with Noelle at GI- this was a return call to see what Noelle needed for this patient

## 2018-05-09 ENCOUNTER — Encounter: Payer: Self-pay | Admitting: Family Medicine

## 2018-05-09 NOTE — Progress Notes (Signed)
PCP: Lavone Orn, MD  Subjective:   HPI: Patient is a 62 y.o. female here for right shoulder pain.  8/16: Patient reports her right hip is doing better and now a 1 out of 10 level of pain. She is worked with Mickie Kay and Barbaraann Barthel, doing her home exercises regularly. New issue is for about 3 months she has had right lateral upper arm pain with weakness. She stopped doing upper extremity exercises and planks due to pain. Said trigger point massage. She describes the pain as a 4 out of 10 level with some associated burning. No numbness no radiation past the elbow. Worse laying on her right side and she does have night pain. No skin changes.  9/10: Patient reports she's having problems with right shoulder/upper arm. Pain level 6/10 and sharp. Feels like her neck and shoulders are tight as well. Has been doing physical therapy, had 2 massages. Pain primarily with externally rotating and abducting. No skin changes, numbness. No bowel/bladder dysfunction. Pain radiates some into the upper arm.  Past Medical History:  Diagnosis Date  . Allergy   . Aneurysm of cardiac wall, congenital    a.  septal aneurysm extending into the RVOT (by cardiac MRI in 08/2013)  . Anxiety   . Arthritis   . Asthma   . Depression   . Heart murmur   . Hypothyroidism   . Lyme disease   . MVP (mitral valve prolapse)   . Orthostatic hypotension   . Osteoporosis   . Peripheral neuropathy 06/09/2012  . Plantar fasciitis   . Pneumothorax     Current Outpatient Medications on File Prior to Visit  Medication Sig Dispense Refill  . 5-Hydroxytryptophan (5-HTP) 50 MG CAPS Take 50 mg by mouth daily.    . Alpha-Lipoic Acid 300 MG TABS Take 600 mg by mouth daily.    . AMINO ACIDS COMPLEX PO Take 1 Scoop by mouth daily.     Francia Greaves THYROID 30 MG tablet Take 30 mg by mouth daily.  6  . Ascorbic Acid (VITAMIN C PO) Take 4,000 Units by mouth daily.    [name] BIOTIN PO Take 20,000 mcg by mouth daily.      . calcium gluconate 500 MG tablet Take 1 tablet by mouth daily.    . Cholecalciferol (VITAMIN D3) 10000 units capsule Take 20,000 Units by mouth daily.     . Coenzyme Q10 (CO Q 10) 100 MG CAPS Take 200 mg by mouth daily.    . Cyanocobalamin (VITAMIN B-12 ER PO) Take 2,500 mcg by mouth daily.     . Digestive Enzymes (DIGESTIVE ENZYME PO) Take 1 tablet by mouth daily.    [name] GLUTATHIONE PO Take 1 capsule by mouth daily.    [name] MAGNESIUM CITRATE PO Take 800 mg by mouth daily.     . Menaquinone-7 (VITAMIN K2 PO) Take 1 tablet by mouth daily.    . Misc Natural Products (CHLORELLA) 500 MG CAPS Take 500 mg by mouth daily.    . NON FORMULARY Take 2 tablets by mouth daily. "NT Energy Factor"    . NON FORMULARY Take 3 tablets by mouth daily. "Energy Multi Flex"    . Omega-3 Fatty Acids (FISH OIL) 1000 MG CAPS Take 1,000 mg by mouth daily.    [name] OVER THE COUNTER MEDICATION Take 5 mLs by mouth daily. Nitric Balance (K-68)    . Probiotic Product (PROBIOTIC DAILY PO) Take 1 capsule by mouth daily.      Current Facility-Administered  Medications on File Prior to Visit  Medication Dose Route Frequency Provider Last Rate Last Dose  . 0.9 %  sodium chloride infusion  500 mL Intravenous Once Pyrtle, [name], MD        Past Surgical History:  Procedure Laterality Date  . COLONOSCOPY WITH PROPOFOL N/A 02/22/2013   Procedure: COLONOSCOPY WITH PROPOFOL;  Surgeon: Garlan Fair, MD;  Location: [facility];  Service: Endoscopy;  Laterality: N/A;  . MOUTH SURGERY  01/2012   bone graft in mouth  . NASAL SINUS SURGERY Right 06/12/2014   Procedure: RIGHT ENDOSCOPIC MAXILLARY ANTROSTOMY;  Surgeon: Ascencion Dike, MD;  Location: [facility];  Service: ENT;  Laterality: Right;  . ROOT CANAL  02/27/2012  . TONSILLECTOMY  1962    No Known Allergies  Social History   Socioeconomic History  . Marital status: Divorced    Spouse name: Not on file  . Number of children: 0  . Years of education: Not on file   . Highest education level: Not on file  Occupational History    Comment: Career and life coach  Social Needs  . Financial resource strain: Not on file  . Food insecurity:    Worry: Not on file    Inability: Not on file  . Transportation needs:    Medical: Not on file    Non-medical: Not on file  Tobacco Use  . Smoking status: Never Smoker  . Smokeless tobacco: Never Used  Substance and Sexual Activity  . Alcohol use: No    Alcohol/week: 0.0 standard drinks  . Drug use: No  . Sexual activity: Not on file  Lifestyle  . Physical activity:    Days per week: Not on file    Minutes per session: Not on file  . Stress: Not on file  Relationships  . Social connections:    Talks on phone: Not on file    Gets together: Not on file    Attends religious service: Not on file    Active member of club or organization: Not on file    Attends meetings of clubs or organizations: Not on file    Relationship status: Not on file  . Intimate partner violence:    Fear of current or ex partner: Not on file    Emotionally abused: Not on file    Physically abused: Not on file    Forced sexual activity: Not on file  Other Topics Concern  . Not on file  Social History Narrative   Patient is single and lives alone.   Patient is self-employed, career and life coaching.   Patient drinks two to four cups of caffeine daily.    Family History  Problem Relation Age of Onset  . Hyperlipidemia Father   . Heart failure Father   . Prostate cancer Father   . Kidney failure Father   . Hypertension Father   . Angina Mother   . Hypertension Mother   . Lung cancer Mother   . Colon cancer Mother   . Lung cancer Paternal Grandfather   . Breast cancer Paternal Grandmother   . Diabetes Paternal Grandmother   . Esophageal cancer Neg Hx   . Liver cancer Neg Hx   . Pancreatic cancer Neg Hx   . Rectal cancer Neg Hx   . Stomach cancer Neg Hx     BP (!) 97/59   Pulse 63   Ht 5\' 10"  (1.778 m)   Wt 130  lb (59 kg)  BMI 18.65 kg/m   Review of Systems: See HPI above.     Objective:  Physical Exam:  Gen: NAD, comfortable in exam room  Neck: No gross deformity, swelling, bruising. TTP bilateral cervical paraspinal regions, trapezius.  No midline/bony TTP. FROM without pain. BUE strength 5/5.   Sensation intact to light touch.   NV intact distal BUEs.  Right shoulder: No swelling, ecchymoses.  No gross deformity. TTP bilateral cervical paraspinal regions, trapezius. ER limited by 10 degrees compared to left (70 vs 80).  Abduction and flexion to 150 degrees. Negative Hawkins, Neers. Negative Yergasons. Strength 5/5 with empty can and resisted internal/external rotation. Negative apprehension. NV intact distally.  Left shoulder: No swelling, ecchymoses.  No gross deformity. No TTP. FROM. Strength 5/5 with empty can and resisted internal/external rotation.NV intact distally.   Assessment & Plan:  1. Right shoulder pain - Patient's impingement signs have improved.  She's having most problems at extremes of motion but only very minimal limitation in motion actively and passively.  We discussed possible early adhesive capsulitis.  Rest of exam is reassuring today.  Shown codman exercises.  Work with PT still for neck pain, spasms.  Heat, aleve with robaxin as needed.  F/u in 1 month.

## 2018-05-10 ENCOUNTER — Ambulatory Visit: Payer: BLUE CROSS/BLUE SHIELD | Admitting: Family Medicine

## 2018-05-10 ENCOUNTER — Other Ambulatory Visit: Payer: BLUE CROSS/BLUE SHIELD

## 2018-05-11 DIAGNOSIS — E280 Estrogen excess: Secondary | ICD-10-CM | POA: Diagnosis not present

## 2018-05-11 DIAGNOSIS — M810 Age-related osteoporosis without current pathological fracture: Secondary | ICD-10-CM | POA: Diagnosis not present

## 2018-05-11 DIAGNOSIS — Z77120 Contact with and (suspected) exposure to mold (toxic): Secondary | ICD-10-CM | POA: Diagnosis not present

## 2018-05-11 DIAGNOSIS — G609 Hereditary and idiopathic neuropathy, unspecified: Secondary | ICD-10-CM | POA: Diagnosis not present

## 2018-05-11 DIAGNOSIS — E039 Hypothyroidism, unspecified: Secondary | ICD-10-CM | POA: Diagnosis not present

## 2018-05-11 NOTE — Telephone Encounter (Signed)
Per previous telephone call scan is on hold.

## 2018-05-12 DIAGNOSIS — M79651 Pain in right thigh: Secondary | ICD-10-CM | POA: Diagnosis not present

## 2018-05-12 DIAGNOSIS — M9905 Segmental and somatic dysfunction of pelvic region: Secondary | ICD-10-CM | POA: Diagnosis not present

## 2018-05-12 DIAGNOSIS — M25551 Pain in right hip: Secondary | ICD-10-CM | POA: Diagnosis not present

## 2018-05-12 DIAGNOSIS — M62830 Muscle spasm of back: Secondary | ICD-10-CM | POA: Diagnosis not present

## 2018-05-12 DIAGNOSIS — M9903 Segmental and somatic dysfunction of lumbar region: Secondary | ICD-10-CM | POA: Diagnosis not present

## 2018-05-12 DIAGNOSIS — Z713 Dietary counseling and surveillance: Secondary | ICD-10-CM | POA: Diagnosis not present

## 2018-05-12 DIAGNOSIS — M9902 Segmental and somatic dysfunction of thoracic region: Secondary | ICD-10-CM | POA: Diagnosis not present

## 2018-05-12 DIAGNOSIS — G609 Hereditary and idiopathic neuropathy, unspecified: Secondary | ICD-10-CM | POA: Diagnosis not present

## 2018-05-13 ENCOUNTER — Ambulatory Visit: Payer: BLUE CROSS/BLUE SHIELD | Admitting: Internal Medicine

## 2018-05-13 DIAGNOSIS — F4322 Adjustment disorder with anxiety: Secondary | ICD-10-CM | POA: Diagnosis not present

## 2018-05-20 DIAGNOSIS — F4322 Adjustment disorder with anxiety: Secondary | ICD-10-CM | POA: Diagnosis not present

## 2018-05-28 DIAGNOSIS — L570 Actinic keratosis: Secondary | ICD-10-CM | POA: Diagnosis not present

## 2018-05-31 DIAGNOSIS — F4322 Adjustment disorder with anxiety: Secondary | ICD-10-CM | POA: Diagnosis not present

## 2018-06-01 DIAGNOSIS — R51 Headache: Secondary | ICD-10-CM | POA: Diagnosis not present

## 2018-06-02 DIAGNOSIS — E280 Estrogen excess: Secondary | ICD-10-CM | POA: Diagnosis not present

## 2018-06-02 DIAGNOSIS — R5383 Other fatigue: Secondary | ICD-10-CM | POA: Diagnosis not present

## 2018-06-02 DIAGNOSIS — E039 Hypothyroidism, unspecified: Secondary | ICD-10-CM | POA: Diagnosis not present

## 2018-06-02 DIAGNOSIS — E785 Hyperlipidemia, unspecified: Secondary | ICD-10-CM | POA: Diagnosis not present

## 2018-06-04 ENCOUNTER — Ambulatory Visit: Payer: BLUE CROSS/BLUE SHIELD | Admitting: Family Medicine

## 2018-06-08 DIAGNOSIS — F4322 Adjustment disorder with anxiety: Secondary | ICD-10-CM | POA: Diagnosis not present

## 2018-06-10 DIAGNOSIS — L219 Seborrheic dermatitis, unspecified: Secondary | ICD-10-CM | POA: Diagnosis not present

## 2018-06-10 DIAGNOSIS — L650 Telogen effluvium: Secondary | ICD-10-CM | POA: Diagnosis not present

## 2018-06-11 DIAGNOSIS — G609 Hereditary and idiopathic neuropathy, unspecified: Secondary | ICD-10-CM | POA: Diagnosis not present

## 2018-06-11 DIAGNOSIS — M79651 Pain in right thigh: Secondary | ICD-10-CM | POA: Diagnosis not present

## 2018-06-11 DIAGNOSIS — M25551 Pain in right hip: Secondary | ICD-10-CM | POA: Diagnosis not present

## 2018-06-11 DIAGNOSIS — Z713 Dietary counseling and surveillance: Secondary | ICD-10-CM | POA: Diagnosis not present

## 2018-06-14 ENCOUNTER — Encounter: Payer: Self-pay | Admitting: Internal Medicine

## 2018-06-15 DIAGNOSIS — H01024 Squamous blepharitis left upper eyelid: Secondary | ICD-10-CM | POA: Diagnosis not present

## 2018-06-15 DIAGNOSIS — H01021 Squamous blepharitis right upper eyelid: Secondary | ICD-10-CM | POA: Diagnosis not present

## 2018-06-15 DIAGNOSIS — H01025 Squamous blepharitis left lower eyelid: Secondary | ICD-10-CM | POA: Diagnosis not present

## 2018-06-15 DIAGNOSIS — H01022 Squamous blepharitis right lower eyelid: Secondary | ICD-10-CM | POA: Diagnosis not present

## 2018-06-16 DIAGNOSIS — Z77120 Contact with and (suspected) exposure to mold (toxic): Secondary | ICD-10-CM | POA: Diagnosis not present

## 2018-06-16 DIAGNOSIS — G609 Hereditary and idiopathic neuropathy, unspecified: Secondary | ICD-10-CM | POA: Diagnosis not present

## 2018-06-16 DIAGNOSIS — E280 Estrogen excess: Secondary | ICD-10-CM | POA: Diagnosis not present

## 2018-06-16 DIAGNOSIS — E039 Hypothyroidism, unspecified: Secondary | ICD-10-CM | POA: Diagnosis not present

## 2018-06-16 DIAGNOSIS — M810 Age-related osteoporosis without current pathological fracture: Secondary | ICD-10-CM | POA: Diagnosis not present

## 2018-06-17 ENCOUNTER — Other Ambulatory Visit: Payer: BLUE CROSS/BLUE SHIELD

## 2018-06-18 ENCOUNTER — Other Ambulatory Visit: Payer: Self-pay | Admitting: Internal Medicine

## 2018-06-18 DIAGNOSIS — E280 Estrogen excess: Secondary | ICD-10-CM | POA: Insufficient documentation

## 2018-06-18 DIAGNOSIS — H01021 Squamous blepharitis right upper eyelid: Secondary | ICD-10-CM | POA: Diagnosis not present

## 2018-06-18 DIAGNOSIS — H2513 Age-related nuclear cataract, bilateral: Secondary | ICD-10-CM | POA: Diagnosis not present

## 2018-06-24 DIAGNOSIS — F4322 Adjustment disorder with anxiety: Secondary | ICD-10-CM | POA: Diagnosis not present

## 2018-06-28 ENCOUNTER — Other Ambulatory Visit: Payer: Self-pay | Admitting: Internal Medicine

## 2018-06-28 ENCOUNTER — Encounter: Payer: Self-pay | Admitting: Internal Medicine

## 2018-06-28 DIAGNOSIS — E280 Estrogen excess: Secondary | ICD-10-CM

## 2018-06-29 ENCOUNTER — Telehealth: Payer: Self-pay | Admitting: Internal Medicine

## 2018-06-29 ENCOUNTER — Ambulatory Visit: Payer: BLUE CROSS/BLUE SHIELD | Admitting: Internal Medicine

## 2018-06-29 NOTE — Telephone Encounter (Signed)
Pt would like to transfer back to you after leaving and going to [place] family, please advise

## 2018-06-29 NOTE — Telephone Encounter (Signed)
No, I can't add her back at this time

## 2018-06-30 ENCOUNTER — Other Ambulatory Visit (INDEPENDENT_AMBULATORY_CARE_PROVIDER_SITE_OTHER): Payer: BLUE CROSS/BLUE SHIELD

## 2018-06-30 DIAGNOSIS — E280 Estrogen excess: Secondary | ICD-10-CM | POA: Diagnosis not present

## 2018-06-30 DIAGNOSIS — J310 Chronic rhinitis: Secondary | ICD-10-CM | POA: Diagnosis not present

## 2018-06-30 DIAGNOSIS — H9209 Otalgia, unspecified ear: Secondary | ICD-10-CM | POA: Diagnosis not present

## 2018-06-30 DIAGNOSIS — E039 Hypothyroidism, unspecified: Secondary | ICD-10-CM

## 2018-06-30 DIAGNOSIS — J320 Chronic maxillary sinusitis: Secondary | ICD-10-CM | POA: Diagnosis not present

## 2018-06-30 DIAGNOSIS — J0101 Acute recurrent maxillary sinusitis: Secondary | ICD-10-CM | POA: Diagnosis not present

## 2018-06-30 LAB — T3, FREE: T3, Free: 5.2 pg/mL — ABNORMAL HIGH (ref 2.3–4.2)

## 2018-06-30 LAB — T4, FREE: Free T4: 0.8 ng/dL (ref 0.60–1.60)

## 2018-06-30 LAB — CORTISOL: Cortisol, Plasma: 10 ug/dL

## 2018-06-30 LAB — TSH: TSH: 2.78 u[IU]/mL (ref 0.35–4.50)

## 2018-07-01 DIAGNOSIS — F4322 Adjustment disorder with anxiety: Secondary | ICD-10-CM | POA: Diagnosis not present

## 2018-07-05 ENCOUNTER — Other Ambulatory Visit: Payer: BLUE CROSS/BLUE SHIELD

## 2018-07-05 NOTE — Telephone Encounter (Signed)
LVM informing patient and if she would like to still be a patient of this office she can establish with a NP

## 2018-07-07 DIAGNOSIS — F4322 Adjustment disorder with anxiety: Secondary | ICD-10-CM | POA: Diagnosis not present

## 2018-07-07 LAB — TESTOSTERONE, FREE AND TOTAL (INCLUDES SHBG)-(MALES)
% Free Testosterone: 0.3 %
Free Testosterone, S: 0.5 pg/mL — ABNORMAL LOW
Sex Hormone Binding Globulin: 191.2 nmol/L — ABNORMAL HIGH
Testosterone, Serum (Total): 15 ng/dL

## 2018-07-07 LAB — ESTRADIOL, FREE
Estradiol, Free: <0.02 pg/mL
Estradiol: <2 pg/mL

## 2018-07-07 LAB — ANDROSTENEDIONE: Androstenedione: 49 ng/dL

## 2018-07-07 LAB — DHEA-SULFATE, SERUM: DHEA-Sulfate, LCMS: 55 ug/dL

## 2018-07-07 LAB — ACTH: C206 ACTH: 17 pg/mL (ref 6–50)

## 2018-07-08 ENCOUNTER — Encounter: Payer: Self-pay | Admitting: Internal Medicine

## 2018-07-08 DIAGNOSIS — G959 Disease of spinal cord, unspecified: Secondary | ICD-10-CM | POA: Diagnosis not present

## 2018-07-13 ENCOUNTER — Ambulatory Visit: Payer: BLUE CROSS/BLUE SHIELD | Admitting: Internal Medicine

## 2018-07-13 DIAGNOSIS — E280 Estrogen excess: Secondary | ICD-10-CM | POA: Diagnosis not present

## 2018-07-13 DIAGNOSIS — M25511 Pain in right shoulder: Secondary | ICD-10-CM | POA: Diagnosis not present

## 2018-07-13 DIAGNOSIS — Z77120 Contact with and (suspected) exposure to mold (toxic): Secondary | ICD-10-CM | POA: Diagnosis not present

## 2018-07-14 DIAGNOSIS — F4322 Adjustment disorder with anxiety: Secondary | ICD-10-CM | POA: Diagnosis not present

## 2018-07-15 DIAGNOSIS — M25511 Pain in right shoulder: Secondary | ICD-10-CM | POA: Diagnosis not present

## 2018-07-25 DIAGNOSIS — R197 Diarrhea, unspecified: Secondary | ICD-10-CM | POA: Diagnosis not present

## 2018-07-26 DIAGNOSIS — M25511 Pain in right shoulder: Secondary | ICD-10-CM | POA: Diagnosis not present

## 2018-07-26 DIAGNOSIS — R197 Diarrhea, unspecified: Secondary | ICD-10-CM | POA: Diagnosis not present

## 2018-07-28 DIAGNOSIS — E280 Estrogen excess: Secondary | ICD-10-CM | POA: Diagnosis not present

## 2018-07-28 DIAGNOSIS — M25511 Pain in right shoulder: Secondary | ICD-10-CM | POA: Diagnosis not present

## 2018-07-28 DIAGNOSIS — Z77120 Contact with and (suspected) exposure to mold (toxic): Secondary | ICD-10-CM | POA: Diagnosis not present

## 2018-07-28 DIAGNOSIS — M810 Age-related osteoporosis without current pathological fracture: Secondary | ICD-10-CM | POA: Diagnosis not present

## 2018-07-28 DIAGNOSIS — G609 Hereditary and idiopathic neuropathy, unspecified: Secondary | ICD-10-CM | POA: Diagnosis not present

## 2018-07-29 ENCOUNTER — Ambulatory Visit (INDEPENDENT_AMBULATORY_CARE_PROVIDER_SITE_OTHER): Payer: BLUE CROSS/BLUE SHIELD | Admitting: Family Medicine

## 2018-07-29 ENCOUNTER — Encounter: Payer: Self-pay | Admitting: Family Medicine

## 2018-07-29 ENCOUNTER — Ambulatory Visit (HOSPITAL_BASED_OUTPATIENT_CLINIC_OR_DEPARTMENT_OTHER)
Admission: RE | Admit: 2018-07-29 | Discharge: 2018-07-29 | Disposition: A | Discharge status: Home / Self Care | Payer: BLUE CROSS/BLUE SHIELD | Source: Ambulatory Visit | Attending: Family Medicine | Admitting: Family Medicine

## 2018-07-29 VITALS — BP 93/41 | HR 68 | Ht 70.0 in | Wt 130.0 lb

## 2018-07-29 DIAGNOSIS — M25511 Pain in right shoulder: Secondary | ICD-10-CM

## 2018-07-29 DIAGNOSIS — G8929 Other chronic pain: Secondary | ICD-10-CM

## 2018-07-29 NOTE — Patient Instructions (Signed)
You have a frozen shoulder (adhesive capsulitis), a buildup of scar tissue that limits motion of the shoulder joint. Limit lifting and overhead activities as much as possible. Heat 15 minutes at a time 3-4 times a day may help with movement and stiffness. Aleve 2 tabs twice a day with food. Steroid injections in a series have been shown to help with pain and motion. Codman exercises (pendulum, wall walking or table slides, arm circles) - do 3 sets of 10 once or twice a day. We will go ahead with an MRI since you're struggling and it's been 3 months - if this requires x-rays we will let you know. Follow up in 6 weeks but we will talk sooner about your MRI results.

## 2018-07-29 NOTE — Progress Notes (Signed)
PCP: Lavone Orn, MD  Subjective:   HPI: Patient is a 62 y.o. female here for right shoulder pain.  8/16: Patient reports her right hip is doing better and now a 1 out of 10 level of pain. She is worked with Mickie Kay and Barbaraann Barthel, doing her home exercises regularly. New issue is for about 3 months she has had right lateral upper arm pain with weakness. She stopped doing upper extremity exercises and planks due to pain. Said trigger point massage. She describes the pain as a 4 out of 10 level with some associated burning. No numbness no radiation past the elbow. Worse laying on her right side and she does have night pain. No skin changes.  9/10: Patient reports she's having problems with right shoulder/upper arm. Pain level 6/10 and sharp. Feels like her neck and shoulders are tight as well. Has been doing physical therapy, had 2 massages. Pain primarily with externally rotating and abducting. No skin changes, numbness. No bowel/bladder dysfunction. Pain radiates some into the upper arm.  12/5: Patient reports she feels worse compared to last visit. Continues to do physical therapy, home exercises, seeing chiropractor with some manipulation and laser. Motion limited of right shoulder, pain to 5/10 level with internal rotation, sharp. Cannot sleep on right side. Taking advil which helps some. No skin changes, numbness.  Past Medical History:  Diagnosis Date  . Allergy   . Aneurysm of cardiac wall, congenital    a.  septal aneurysm extending into the RVOT (by cardiac MRI in 08/2013)  . Anxiety   . Arthritis   . Asthma   . Depression   . Heart murmur   . Hypothyroidism   . Lyme disease   . MVP (mitral valve prolapse)   . Orthostatic hypotension   . Osteoporosis   . Peripheral neuropathy 06/09/2012  . Plantar fasciitis   . Pneumothorax     Current Outpatient Medications on File Prior to Visit  Medication Sig Dispense Refill  . 5-Hydroxytryptophan  (5-HTP) 50 MG CAPS Take 50 mg by mouth daily.    . Alpha-Lipoic Acid 300 MG TABS Take 600 mg by mouth daily.    . AMINO ACIDS COMPLEX PO Take 1 Scoop by mouth daily.     Francia Greaves THYROID 30 MG tablet Take 30 mg by mouth daily.  6  . Ascorbic Acid (VITAMIN C PO) Take 4,000 Units by mouth daily.    [name] BIOTIN PO Take 20,000 mcg by mouth daily.     . calcium gluconate 500 MG tablet Take 1 tablet by mouth daily.    . Cholecalciferol (VITAMIN D3) 10000 units capsule Take 20,000 Units by mouth daily.     . Coenzyme Q10 (CO Q 10) 100 MG CAPS Take 200 mg by mouth daily.    . Cyanocobalamin (VITAMIN B-12 ER PO) Take 2,500 mcg by mouth daily.     . Digestive Enzymes (DIGESTIVE ENZYME PO) Take 1 tablet by mouth daily.    [name] GLUTATHIONE PO Take 1 capsule by mouth daily.    [name] MAGNESIUM CITRATE PO Take 800 mg by mouth daily.     . Menaquinone-7 (VITAMIN K2 PO) Take 1 tablet by mouth daily.    . methocarbamol (ROBAXIN) 500 MG tablet Take 1 tablet (500 mg total) by mouth every 8 (eight) hours as needed. 60 tablet 1  . Misc Natural Products (CHLORELLA) 500 MG CAPS Take 500 mg by mouth daily.    . NON FORMULARY Take 2 tablets by mouth  daily. "NT Energy Factor"    . NON FORMULARY Take 3 tablets by mouth daily. "Energy Multi Flex"    . Omega-3 Fatty Acids (FISH OIL) 1000 MG CAPS Take 1,000 mg by mouth daily.    [name] OVER THE COUNTER MEDICATION Take 5 mLs by mouth daily. Nitric Balance (K-68)    . Probiotic Product (PROBIOTIC DAILY PO) Take 1 capsule by mouth daily.      Current Facility-Administered Medications on File Prior to Visit  Medication Dose Route Frequency Provider Last Rate Last Dose  . 0.9 %  sodium chloride infusion  500 mL Intravenous Once Pyrtle, [name], MD        Past Surgical History:  Procedure Laterality Date  . COLONOSCOPY WITH PROPOFOL N/A 02/22/2013   Procedure: COLONOSCOPY WITH PROPOFOL;  Surgeon: Garlan Fair, MD;  Location: [facility];  Service: Endoscopy;  Laterality: N/A;  . MOUTH  SURGERY  01/2012   bone graft in mouth  . NASAL SINUS SURGERY Right 06/12/2014   Procedure: RIGHT ENDOSCOPIC MAXILLARY ANTROSTOMY;  Surgeon: Ascencion Dike, MD;  Location: [facility];  Service: ENT;  Laterality: Right;  . ROOT CANAL  02/27/2012  . TONSILLECTOMY  1962    No Known Allergies  Social History   Socioeconomic History  . Marital status: Divorced    Spouse name: Not on file  . Number of children: 0  . Years of education: Not on file  . Highest education level: Not on file  Occupational History    Comment: Career and life coach  Social Needs  . Financial resource strain: Not on file  . Food insecurity:    Worry: Not on file    Inability: Not on file  . Transportation needs:    Medical: Not on file    Non-medical: Not on file  Tobacco Use  . Smoking status: Never Smoker  . Smokeless tobacco: Never Used  Substance and Sexual Activity  . Alcohol use: No    Alcohol/week: 0.0 standard drinks  . Drug use: No  . Sexual activity: Not on file  Lifestyle  . Physical activity:    Days per week: Not on file    Minutes per session: Not on file  . Stress: Not on file  Relationships  . Social connections:    Talks on phone: Not on file    Gets together: Not on file    Attends religious service: Not on file    Active member of club or organization: Not on file    Attends meetings of clubs or organizations: Not on file    Relationship status: Not on file  . Intimate partner violence:    Fear of current or ex partner: Not on file    Emotionally abused: Not on file    Physically abused: Not on file    Forced sexual activity: Not on file  Other Topics Concern  . Not on file  Social History Narrative   Patient is single and lives alone.   Patient is self-employed, career and life coaching.   Patient drinks two to four cups of caffeine daily.    Family History  Problem Relation Age of Onset  . Hyperlipidemia Father   . Heart failure Father   . Prostate  cancer Father   . Kidney failure Father   . Hypertension Father   . Angina Mother   . Hypertension Mother   . Lung cancer Mother   . Colon cancer Mother   . Lung  cancer Paternal Grandfather   . Breast cancer Paternal Grandmother   . Diabetes Paternal Grandmother   . Esophageal cancer Neg Hx   . Liver cancer Neg Hx   . Pancreatic cancer Neg Hx   . Rectal cancer Neg Hx   . Stomach cancer Neg Hx     BP (!) 93/41   Pulse 68   Ht 5\' 10"  (1.778 m)   Wt 130 lb (59 kg)   BMI 18.65 kg/m   Review of Systems: See HPI above.     Objective:  Physical Exam:  Gen: NAD, comfortable in exam room  Right shoulder: No swelling, ecchymoses.  No gross deformity. No TTP. Full abduction, flexion to 150 degrees, ER to 50 degrees, IR 70 degrees. Negative Hawkins, Neers. Negative Yergasons. Strength 5/5 with empty can and resisted internal/external rotation.  Mild pain empty can. Negative apprehension. NV intact distally.  Left shoulder: No swelling, ecchymoses.  No gross deformity. No TTP. FROM. Strength 5/5 with empty can and resisted internal/external rotation. NV intact distally.   Assessment & Plan:  1. Right shoulder pain - consistent with adhesive capsulitis but struggling to make improvement over past 3 months.  Radiographs negative.  Will go ahead with MRI to assess for concurrent rotator cuff tear.  Heat, codman exercises, physical therapy, aleve.  F/u in 6 weeks tentatively but may change based on MRI results.

## 2018-07-30 ENCOUNTER — Encounter: Payer: Self-pay | Admitting: Sports Medicine

## 2018-07-30 ENCOUNTER — Ambulatory Visit (INDEPENDENT_AMBULATORY_CARE_PROVIDER_SITE_OTHER): Payer: BLUE CROSS/BLUE SHIELD | Admitting: Sports Medicine

## 2018-07-30 VITALS — BP 96/64 | HR 63 | Ht 70.0 in | Wt 131.2 lb

## 2018-07-30 DIAGNOSIS — M25511 Pain in right shoulder: Secondary | ICD-10-CM | POA: Diagnosis not present

## 2018-07-30 DIAGNOSIS — M7501 Adhesive capsulitis of right shoulder: Secondary | ICD-10-CM | POA: Diagnosis not present

## 2018-07-30 DIAGNOSIS — E559 Vitamin D deficiency, unspecified: Secondary | ICD-10-CM | POA: Insufficient documentation

## 2018-07-30 DIAGNOSIS — M9901 Segmental and somatic dysfunction of cervical region: Secondary | ICD-10-CM

## 2018-07-30 DIAGNOSIS — M9907 Segmental and somatic dysfunction of upper extremity: Secondary | ICD-10-CM | POA: Diagnosis not present

## 2018-07-30 DIAGNOSIS — G8929 Other chronic pain: Secondary | ICD-10-CM

## 2018-07-30 DIAGNOSIS — M810 Age-related osteoporosis without current pathological fracture: Secondary | ICD-10-CM | POA: Insufficient documentation

## 2018-07-30 DIAGNOSIS — M9902 Segmental and somatic dysfunction of thoracic region: Secondary | ICD-10-CM

## 2018-07-30 NOTE — Patient Instructions (Addendum)
Consider getting dry needling into your pectoralis muscles as well as the infraspinatus    Please perform the exercise program that we have prepared for you and gone over in detail on a daily basis.  In addition to the handout you were provided you can access your program through: [email or web address]   Your unique program code is: [email or web address]

## 2018-08-02 DIAGNOSIS — H1045 Other chronic allergic conjunctivitis: Secondary | ICD-10-CM | POA: Diagnosis not present

## 2018-08-02 DIAGNOSIS — J3089 Other allergic rhinitis: Secondary | ICD-10-CM | POA: Diagnosis not present

## 2018-08-02 DIAGNOSIS — J301 Allergic rhinitis due to pollen: Secondary | ICD-10-CM | POA: Diagnosis not present

## 2018-08-02 DIAGNOSIS — J3081 Allergic rhinitis due to animal (cat) (dog) hair and dander: Secondary | ICD-10-CM | POA: Diagnosis not present

## 2018-08-03 ENCOUNTER — Encounter: Payer: Self-pay | Admitting: Internal Medicine

## 2018-08-03 DIAGNOSIS — R5383 Other fatigue: Secondary | ICD-10-CM | POA: Diagnosis not present

## 2018-08-03 DIAGNOSIS — R5381 Other malaise: Secondary | ICD-10-CM | POA: Diagnosis not present

## 2018-08-03 DIAGNOSIS — M810 Age-related osteoporosis without current pathological fracture: Secondary | ICD-10-CM | POA: Diagnosis not present

## 2018-08-03 DIAGNOSIS — R7989 Other specified abnormal findings of blood chemistry: Secondary | ICD-10-CM | POA: Diagnosis not present

## 2018-08-03 DIAGNOSIS — D510 Vitamin B12 deficiency anemia due to intrinsic factor deficiency: Secondary | ICD-10-CM | POA: Diagnosis not present

## 2018-08-03 DIAGNOSIS — M25511 Pain in right shoulder: Secondary | ICD-10-CM | POA: Diagnosis not present

## 2018-08-04 DIAGNOSIS — F4322 Adjustment disorder with anxiety: Secondary | ICD-10-CM | POA: Diagnosis not present

## 2018-08-05 DIAGNOSIS — M25511 Pain in right shoulder: Secondary | ICD-10-CM | POA: Diagnosis not present

## 2018-08-06 ENCOUNTER — Encounter: Payer: Self-pay | Admitting: Internal Medicine

## 2018-08-10 ENCOUNTER — Encounter: Payer: Self-pay | Admitting: Sports Medicine

## 2018-08-10 ENCOUNTER — Ambulatory Visit (INDEPENDENT_AMBULATORY_CARE_PROVIDER_SITE_OTHER): Payer: BLUE CROSS/BLUE SHIELD | Admitting: Sports Medicine

## 2018-08-10 VITALS — BP 92/64 | HR 76 | Ht 70.0 in | Wt 129.8 lb

## 2018-08-10 DIAGNOSIS — M810 Age-related osteoporosis without current pathological fracture: Secondary | ICD-10-CM

## 2018-08-10 DIAGNOSIS — M7501 Adhesive capsulitis of right shoulder: Secondary | ICD-10-CM | POA: Diagnosis not present

## 2018-08-10 DIAGNOSIS — D72810 Lymphocytopenia: Secondary | ICD-10-CM | POA: Diagnosis not present

## 2018-08-10 DIAGNOSIS — G629 Polyneuropathy, unspecified: Secondary | ICD-10-CM | POA: Diagnosis not present

## 2018-08-10 DIAGNOSIS — D803 Selective deficiency of immunoglobulin G [IgG] subclasses: Secondary | ICD-10-CM | POA: Diagnosis not present

## 2018-08-10 DIAGNOSIS — D890 Polyclonal hypergammaglobulinemia: Secondary | ICD-10-CM | POA: Diagnosis not present

## 2018-08-10 MED ORDER — AMITRIPTYLINE HCL 25 MG PO TABS: 12.5000 mg | ORAL_TABLET | Freq: Every day | ORAL | 3 refills | Status: DC

## 2018-08-10 NOTE — Progress Notes (Signed)
Juanda Bond. , [place] [facility] [place] - 62 y.o. female MRN [id number]  Date of birth: 1956-08-17  Visit Date: 08/10/2018    PCP: Lavone Orn, MD   Referred by: Lavone Orn, MD   SUBJECTIVE:  Chief Complaint  Patient presents with  . f/u R shoulder pain    Has been to see Barbaraann Barthel, did NOT receive dry needling. Sx have worsened, constant pain. MRI scheduled for Friday, Doing HEP from PT 3 x daily and HEP from [facility] once daily.     HPI: Patient is here for follow-up of right shoulder pain.  She has had worsening constant symptoms over the past 2 weeks.  It is seemingly worsening with physical therapy.  She is having pain that radiates towards the elbow but not beyond.  It is improved with resting and heating.  She has been performing towel stretches for her neck and her shoulder in addition to the manual therapy with Barbaraann Barthel.  REVIEW OF SYSTEMS: She denies any numbness or tingling radiating past the elbow.   She has had some nighttime disturbances due to this.  HISTORY:  Prior history reviewed and updated per electronic medical record.  Social History   Occupational History    Comment: Designer, jewellery and life coach  Tobacco Use  . Smoking status: Never Smoker  . Smokeless tobacco: Never Used  Substance and Sexual Activity  . Alcohol use: No    Alcohol/week: 0.0 standard drinks  . Drug use: No  . Sexual activity: Not on file   Social History   Social History Narrative   Patient is single and lives alone.   Patient is self-employed, career and life coaching.   Patient drinks two to four cups of caffeine daily.     DATA OBTAINED & REVIEWED:  Recent Labs    11/04/17 0918  01/19/18 0941 03/20/18 2221 04/21/18 06/30/18 0725 08/22/18 1759  CALCIUM 9.5   < > 9.2 9.4  --   --  9.2  TSH 2.74  --   --   --  5.60 2.78  --    < > = values in this interval not displayed.   No problems  updated. No specialty comments available.  OBJECTIVE:  VS:  HT:5\' 10"  (177.8 cm)   WT:129 lb 12.8 oz (58.9 kg)  BMI:18.62    BP:92/64  HR:76bpm  TEMP: ( )  RESP:99 %   PHYSICAL EXAM: Adult female.  No acute distress.  Alert appropriate.  Her right shoulder has marked limitations in range of motion and she is actually significantly less mobile than at last visit with only a 30 degree arc held at 30 degrees of abduction.  She has pain with axial load.  There is no pain until end range.   ASSESSMENT   1. Adhesive capsulitis of right shoulder   2. Osteoporosis without current pathological fracture, unspecified osteoporosis type     PLAN:  Pertinent additional documentation may be included in corresponding procedure notes, imaging studies, problem based documentation and patient instructions.  Procedures:  None  Medications:  Meds ordered this encounter  Medications  . DISCONTD: amitriptyline (ELAVIL) 25 MG tablet    Sig: Take 0.5 tablets (12.5 mg total) by mouth at bedtime.    Dispense:  30 tablet    Refill:  3    Discussion/Instructions: No problem-specific Assessment & Plan notes found for this encounter.   Long discussion today with the  patient.  Ultimately she has worsening adhesive capsulitis.  She does have an upcoming MRI and would like to hold off on injection until after this is obtained.  I will plan to follow-up with her after so.  Given the nighttime disturbances Elavil will be beneficial.  Prescription for this was provided.  >50% of this 25 minute visit spent in direct patient counseling and/or coordination of care.  Discussion was focused on education regarding the in discussing the pathoetiology and anticipated clinical course of the above condition.   Return in about 10 days (around 08/20/2018).          Gerda Diss, [facility] Physician

## 2018-08-13 ENCOUNTER — Ambulatory Visit
Admission: RE | Admit: 2018-08-13 | Discharge: 2018-08-13 | Disposition: A | Discharge status: Home / Self Care | Payer: BLUE CROSS/BLUE SHIELD | Source: Ambulatory Visit | Attending: Family Medicine | Admitting: Family Medicine

## 2018-08-13 DIAGNOSIS — M25511 Pain in right shoulder: Principal | ICD-10-CM

## 2018-08-13 DIAGNOSIS — G8929 Other chronic pain: Secondary | ICD-10-CM

## 2018-08-16 ENCOUNTER — Encounter: Payer: Self-pay | Admitting: Family Medicine

## 2018-08-17 DIAGNOSIS — D803 Selective deficiency of immunoglobulin G [IgG] subclasses: Secondary | ICD-10-CM | POA: Diagnosis not present

## 2018-08-17 DIAGNOSIS — L989 Disorder of the skin and subcutaneous tissue, unspecified: Secondary | ICD-10-CM | POA: Diagnosis not present

## 2018-08-17 DIAGNOSIS — N069 Isolated proteinuria with unspecified morphologic lesion: Secondary | ICD-10-CM | POA: Diagnosis not present

## 2018-08-17 DIAGNOSIS — D72810 Lymphocytopenia: Secondary | ICD-10-CM | POA: Diagnosis not present

## 2018-08-19 ENCOUNTER — Other Ambulatory Visit: Payer: Self-pay | Admitting: Obstetrics and Gynecology

## 2018-08-19 DIAGNOSIS — E280 Estrogen excess: Secondary | ICD-10-CM

## 2018-08-20 ENCOUNTER — Ambulatory Visit: Payer: Self-pay

## 2018-08-20 ENCOUNTER — Ambulatory Visit (INDEPENDENT_AMBULATORY_CARE_PROVIDER_SITE_OTHER): Payer: BLUE CROSS/BLUE SHIELD | Admitting: Sports Medicine

## 2018-08-20 ENCOUNTER — Encounter: Payer: Self-pay | Admitting: Sports Medicine

## 2018-08-20 VITALS — BP 102/72 | HR 66 | Ht 70.0 in | Wt 133.2 lb

## 2018-08-20 DIAGNOSIS — M7501 Adhesive capsulitis of right shoulder: Secondary | ICD-10-CM

## 2018-08-20 NOTE — Procedures (Signed)
PROCEDURE NOTE:  Ultrasound Guided: Injection: Right shoulder, Intra-articular Images were obtained and interpreted by myself, Teresa Coombs, DO  Images have been saved and stored to PACS system. Images obtained on: GE S7 Ultrasound machine    ULTRASOUND FINDINGS:  Thickening and poor capsular mobility consistent with adhesive capsulitis.  DESCRIPTION OF PROCEDURE:  The patient's clinical condition is marked by substantial pain and/or significant functional disability. Other conservative therapy has not provided relief, is contraindicated, or not appropriate. There is a reasonable likelihood that injection will significantly improve the patient's pain and/or functional impairment.   After discussing the risks, benefits and expected outcomes of the injection and all questions were reviewed and answered, the patient wished to undergo the above named procedure.  Verbal consent was obtained.  The ultrasound was used to identify the target structure and adjacent neurovascular structures. The skin was then prepped in sterile fashion and the target structure was injected under direct visualization using sterile technique as below:  Single injection performed as below: PREP: Alcohol and Ethel Chloride APPROACH:posterior, single injection, 21g 2 in. INJECTATE: 5 cc 1% lidocaine, 2 cc 0.5% Marcaine, 0.5 cc 40mg /mL DepoMedrol and 5 cc Normal saline ASPIRATE: None DRESSING: Band-Aid  Post procedural instructions including recommending icing and warning signs for infection were reviewed.    This procedure was well tolerated and there were no complications.   IMPRESSION: Succesful Ultrasound Guided: Injection

## 2018-08-20 NOTE — Progress Notes (Signed)
Megan [name]. Megan [name], [place] at [place] - 62 y.o. female MRN [id number]  Date of birth: [birth year]  Visit Date: 08/20/2018  PCP: Lavone Orn, MD   Referred by: Lavone Orn, MD   SUBJECTIVE:  Chief Complaint  Patient presents with  . f/u R shoulder    MRI R shoulder 08/13/2018.     HPI: Patient is here for follow-up of her right shoulder pain.  She underwent MRI on 08/13/2018 and is here today to review these results.  She continues to have worsening and actually seems to be having exacerbations with her symptoms as she works through physical therapy.  REVIEW OF SYSTEMS: She is having nighttime disturbances.   HISTORY:  Prior history reviewed and updated per electronic medical record.  Social History   Occupational History    Comment: Designer, jewellery and life coach  Tobacco Use  . Smoking status: Never Smoker  . Smokeless tobacco: Never Used  Substance and Sexual Activity  . Alcohol use: No    Alcohol/week: 0.0 standard drinks  . Drug use: No  . Sexual activity: Not on file   Social History   Social History Narrative   Patient is single and lives alone.   Patient is self-employed, career and life coaching.   Patient drinks two to four cups of caffeine daily.     DATA OBTAINED & REVIEWED:  Recent Labs    11/04/17 0918  01/19/18 0941 03/20/18 2221 04/21/18 06/30/18 0725 08/22/18 1759  CALCIUM 9.5   < > 9.2 9.4  --   --  9.2  TSH 2.74  --   --   --  5.60 2.78  --    < > = values in this interval not displayed.   No problems updated. No specialty comments available.  OBJECTIVE:  VS:  HT:5\' 10"  (177.8 cm)   WT:133 lb 3.2 oz (60.4 kg)  BMI:19.11    BP:102/72  HR:66bpm  TEMP: ( )  RESP:99 %   PHYSICAL EXAM: Adult female. No acute distress.  Alert and appropriate. Overall her range of motion is actually worsened since last visit.  She has minimal internal and external rotation at  this time without pain.   ASSESSMENT   1. Adhesive capsulitis of right shoulder     PLAN:  Pertinent additional documentation may be included in corresponding procedure notes, imaging studies, problem based documentation and patient instructions.  Procedures:  US Guided Injection per procedure note      Medications:  No orders of the defined types were placed in this encounter.   Discussion/Instructions: No problem-specific Assessment & Plan notes found for this encounter.  THERAPEUTIC EXERCISE: Discussed the foundation of treatment for this condition is physical therapy and/or daily (5-6 days/week) therapeutic exercises, focusing on core strengthening, coordination, neuromuscular control/reeducation.  Continue previously prescribed home exercise program.  RICE (Rest, ICE, Compression, Elevation) principles reviewed with the patient. Discussed appropriate use of both heat and ice with the patient today.  >50% of this 25 minutes minute visit spent in direct patient counseling and/or coordination of care. Discussion was focused on education regarding the in discussing the pathoetiology and anticipated clinical course of the above condition.  Long discussion today regarding treatment options for adhesive capsulitis which is what this is consistent with.  Extensive time was spent discussing the etiology and idiopathic nature of this.  Ultimately she was agreeable to a large-volume intra-articular injection today with a low-dose of  steroid.  Ultimately she will follow-up with Dr. Belia Heman for PT next week we did discuss that allowing the medicine to work and having her resume/start the amitriptyline will likely be of benefit.  If any lack of improvement: consider referral to Orthopedics for Manipulation under anesthesia  At follow up will plan : to consider repeat corticosteroid injections             Gerda Diss, [facility] Physician

## 2018-08-20 NOTE — Patient Instructions (Signed)

## 2018-08-22 ENCOUNTER — Other Ambulatory Visit: Payer: Self-pay

## 2018-08-22 ENCOUNTER — Encounter (HOSPITAL_COMMUNITY): Payer: Self-pay | Admitting: Emergency Medicine

## 2018-08-22 ENCOUNTER — Emergency Department (HOSPITAL_COMMUNITY): Payer: BLUE CROSS/BLUE SHIELD

## 2018-08-22 ENCOUNTER — Emergency Department (HOSPITAL_COMMUNITY)
Admission: EM | Admit: 2018-08-22 | Discharge: 2018-08-22 | Disposition: A | Discharge status: Home / Self Care | Payer: BLUE CROSS/BLUE SHIELD | Attending: Emergency Medicine | Admitting: Emergency Medicine

## 2018-08-22 DIAGNOSIS — H5310 Unspecified subjective visual disturbances: Secondary | ICD-10-CM | POA: Diagnosis not present

## 2018-08-22 DIAGNOSIS — H5711 Ocular pain, right eye: Secondary | ICD-10-CM | POA: Diagnosis not present

## 2018-08-22 DIAGNOSIS — Z79899 Other long term (current) drug therapy: Secondary | ICD-10-CM | POA: Diagnosis not present

## 2018-08-22 DIAGNOSIS — J45909 Unspecified asthma, uncomplicated: Secondary | ICD-10-CM | POA: Diagnosis not present

## 2018-08-22 DIAGNOSIS — E039 Hypothyroidism, unspecified: Secondary | ICD-10-CM | POA: Insufficient documentation

## 2018-08-22 DIAGNOSIS — R079 Chest pain, unspecified: Secondary | ICD-10-CM | POA: Diagnosis not present

## 2018-08-22 LAB — BASIC METABOLIC PANEL
Anion gap: 11 (ref 5–15)
BUN: 6 mg/dL — ABNORMAL LOW (ref 8–23)
CO2: 26 mmol/L (ref 22–32)
Calcium: 9.2 mg/dL (ref 8.9–10.3)
Chloride: 105 mmol/L (ref 98–111)
Creatinine, Ser: 0.65 mg/dL (ref 0.44–1.00)
GFR calc Af Amer: >60 mL/min (ref >60)
GFR calc non Af Amer: >60 mL/min (ref >60)
Glucose, Bld: 82 mg/dL (ref 70–99)
Potassium: 3.4 mmol/L — ABNORMAL LOW (ref 3.5–5.1)
Sodium: 142 mmol/L (ref 135–145)

## 2018-08-22 LAB — CBC
HCT: 38.4 % (ref 36.0–46.0)
Hemoglobin: 12.2 g/dL (ref 12.0–15.0)
MCH: 29.3 pg (ref 26.0–34.0)
MCHC: 31.8 g/dL (ref 30.0–36.0)
MCV: 92.3 fL (ref 80.0–100.0)
Platelets: 223 10*3/uL (ref 150–400)
RBC: 4.16 MIL/uL (ref 3.87–5.11)
RDW: 12.2 % (ref 11.5–15.5)
WBC: 3.5 10*3/uL — ABNORMAL LOW (ref 4.0–10.5)
nRBC: 0 % (ref 0.0–0.2)

## 2018-08-22 LAB — I-STAT TROPONIN, ED
Troponin i, poc: 0 ng/mL (ref 0.00–0.08)
Troponin i, poc: 0 ng/mL (ref 0.00–0.08)

## 2018-08-22 MED ORDER — IOPAMIDOL (ISOVUE-370) INJECTION 76%
100.0000 mL | Freq: Once | INTRAVENOUS | Status: AC | PRN
  Administered 2018-08-22: 100 mL via INTRAVENOUS

## 2018-08-22 MED ORDER — TETRACAINE HCL 0.5 % OP SOLN
1.0000 [drp] | Freq: Once | OPHTHALMIC | Status: AC
  Administered 2018-08-22: 2 [drp] via OPHTHALMIC
  Filled 2018-08-22: qty 4

## 2018-08-22 MED ORDER — ASPIRIN 325 MG PO TABS
325.0000 mg | ORAL_TABLET | ORAL | Status: AC
  Administered 2018-08-22: 325 mg via ORAL
  Filled 2018-08-22: qty 1

## 2018-08-22 MED ORDER — IOPAMIDOL (ISOVUE-370) INJECTION 76%
INTRAVENOUS | Status: AC
  Filled 2018-08-22: qty 100

## 2018-08-22 NOTE — ED Provider Notes (Signed)
[name] EMERGENCY DEPARTMENT Provider Note   CSN: [id number] Arrival date & time: 08/22/18  1730     History   Chief Complaint Chief Complaint  Patient presents with  . Chest Pain  . Eye Problem    HPI Megan [name] is a 62 y.o. female.  The history is provided by the patient. No language interpreter was used.   Patient is a 62 year old female with a past medical history of anxiety, arthritis, depression, hypothyroidism, mitral valve prolapse, orthostatic hypotension, and congenital cardiac wall aneurysm who presents for evaluation of chest pain and floaters to her right eye.  Patient states her chest pain began yesterday afternoon and is intermittent.  She describes it as sharp and radiating from her left chest to her right chest.  It does not radiate to her back, arms, or neck.  It is not associated with nausea or diaphoresis.  She denies prior similar episodes.  Denies exertional or positional factors.  She denies any cough, fevers, chills, shortness of breath, vomiting, diarrhea, dysuria, blood in her stool, blood in her urine, extremity pain, extremity weakness, numbness, rashes, or other acute complaints.  Denies any recent traumatic injury.  Regarding the patient's eye floaters she states she noticed some black spots in her right eye yesterday as well that have since transitioned to shooting flecks of white light.  She feels that her vision might be a little blurrier than her left eye but denies any pain in her eye or headaches.  Denies any earache, sore throat, runny nose, neck pain, or vertigo.  She denies wearing contacts but she is glasses to read.  She denies prior similar episodes.  Denies any known glaucoma, macular degeneration, or prior vitreous or retinal detachments.    Patient denies any history of DVT/PE, hemoptysis, recent surgery, tobacco abuse, estrogen supplementation, but does note she was noted to have an elevated estrogen.   Past Medical  History:  Diagnosis Date  . Allergy   . Aneurysm of cardiac wall, congenital    a.  septal aneurysm extending into the RVOT (by cardiac MRI in 08/2013)  . Anxiety   . Arthritis   . Asthma   . Depression   . Heart murmur   . Hypothyroidism   . Lyme disease   . MVP (mitral valve prolapse)   . Orthostatic hypotension   . Osteoporosis   . Peripheral neuropathy 06/09/2012  . Plantar fasciitis   . Pneumothorax     Patient Active Problem List   Diagnosis Date Noted  . Vitamin D deficiency, unspecified 07/30/2018  . Age-related osteoporosis without current pathological fracture 07/30/2018  . Estrogen excess 06/18/2018  . Hypogammaglobulinemia ([place]) 01/11/2018  . Right hip pain 12/30/2017  . Bilateral ankle pain 11/09/2017  . Hypothyroidism 10/27/2017  . Hair loss 04/25/2017  . Neuropathic pain of left lower extremity 04/22/2017  . Great toe pain, left 04/10/2017  . Precordial chest pain 12/17/2016  . Aneurysm of right ventricle of heart 12/17/2016  . Chronic fatigue 03/05/2016  . Neuropathy associated with MGUS ([place]) 12/26/2013  . Orthostatic hypotension 08/01/2013  . Mitral valve prolapse 08/01/2013  . Spider veins of limb 02/15/2013  . Leukopenia 05/13/2012  . Bilateral Dorsal Foot pain 04/23/2011  . Gait abnormality 04/23/2011  . Leg length inequality 04/23/2011  . ASTHMA 11/16/2008  . Osteoporosis 10/19/2008  . Nonspecific (abnormal) findings on radiological and other examination of body structure 07/24/2008  . ABNORMAL CHEST XRAY 07/24/2008    Past Surgical History:  Procedure Laterality Date  . COLONOSCOPY WITH PROPOFOL N/A 02/22/2013   Procedure: COLONOSCOPY WITH PROPOFOL;  Surgeon: Garlan Fair, MD;  Location: [facility];  Service: Endoscopy;  Laterality: N/A;  . MOUTH SURGERY  01/2012   bone graft in mouth  . NASAL SINUS SURGERY Right 06/12/2014   Procedure: RIGHT ENDOSCOPIC MAXILLARY ANTROSTOMY;  Surgeon: Ascencion Dike, MD;  Location: [facility];  Service: ENT;  Laterality: Right;  . ROOT CANAL  02/27/2012  . TONSILLECTOMY  1962     OB History   No obstetric history on file.      Home Medications    Prior to Admission medications   Medication Sig Start Date End Date Taking? Authorizing Provider  5-Hydroxytryptophan (5-HTP) 50 MG CAPS Take 50 mg by mouth daily.   Yes [provider]  Alpha-Lipoic Acid 300 MG TABS Take 600 mg by mouth daily.   Yes [provider]  AMINO ACIDS COMPLEX PO Take 1 Scoop by mouth daily.    Yes [provider]  ARMOUR THYROID 30 MG tablet Take 30 mg by mouth daily. 11/20/16  Yes [provider]  Ascorbic Acid (VITAMIN C PO) Take 4,000 Units by mouth daily.   Yes [provider]  BIOTIN PO Take 20,000 mcg by mouth daily.    Yes [provider]  Cholecalciferol (VITAMIN D3) 10000 units capsule Take 20,000 Units by mouth daily.    Yes [provider]  Cyanocobalamin (VITAMIN B-12 ER PO) Take 2,500 mcg by mouth daily.    Yes [provider]  Digestive Enzymes (DIGESTIVE ENZYME PO) Take 1 tablet by mouth daily.   Yes [provider]  MAGNESIUM CITRATE PO Take 800 mg by mouth daily.    Yes [provider]  Menaquinone-7 (VITAMIN K2 PO) Take 1 tablet by mouth daily.   Yes [provider]  Omega-3 Fatty Acids (FISH OIL) 1000 MG CAPS Take 1,000 mg by mouth daily.   Yes [provider]  Probiotic Product (PROBIOTIC DAILY PO) Take 1 capsule by mouth daily.    Yes [provider]  amitriptyline (ELAVIL) 25 MG tablet Take 0.5 tablets (12.5 mg total) by mouth at bedtime. Patient not taking: Reported on 08/20/2018 08/10/18   Gerda Diss, DO    Family History Family History  Problem Relation Age of Onset  . Hyperlipidemia Father   . Heart failure Father   . Prostate cancer Father   . Kidney failure Father   . Hypertension Father   . Angina Mother   . Hypertension Mother   . Lung  cancer Mother   . Colon cancer Mother   . Lung cancer Paternal Grandfather   . Breast cancer Paternal Grandmother   . Diabetes Paternal Grandmother   . Esophageal cancer Neg Hx   . Liver cancer Neg Hx   . Pancreatic cancer Neg Hx   . Rectal cancer Neg Hx   . Stomach cancer Neg Hx     Social History Social History   Tobacco Use  . Smoking status: Never Smoker  . Smokeless tobacco: Never Used  Substance Use Topics  . Alcohol use: No    Alcohol/week: 0.0 standard drinks  . Drug use: No     Allergies   Patient has no known allergies.   Review of Systems Review of Systems  Constitutional: Negative for chills and fever.  HENT: Negative for ear pain and sore throat.   Eyes: Positive for visual disturbance. Negative for  pain.  Respiratory: Negative for cough and shortness of breath.   Cardiovascular: Negative for chest pain and palpitations.  Gastrointestinal: Negative for abdominal pain and vomiting.  Genitourinary: Negative for dysuria and hematuria.  Musculoskeletal: Negative for arthralgias and back pain.  Skin: Negative for color change and rash.  Neurological: Negative for seizures and syncope.  All other systems reviewed and are negative.    Physical Exam Updated Vital Signs BP 112/77   Pulse 66   Temp 98.7 F (37.1 C) (Oral)   Resp 11   SpO2 100%   Physical Exam Vitals signs and nursing note reviewed.  Constitutional:      General: She is not in acute distress.    Appearance: She is well-developed.  HENT:     Head: Normocephalic and atraumatic.  Eyes:     Conjunctiva/sclera: Conjunctivae normal.  Neck:     Musculoskeletal: Neck supple.  Cardiovascular:     Rate and Rhythm: Normal rate and regular rhythm.     Heart sounds: No murmur.  Pulmonary:     Effort: Pulmonary effort is normal. No respiratory distress.     Breath sounds: Normal breath sounds.  Abdominal:     Palpations: Abdomen is soft.     Tenderness: There is no abdominal tenderness.    Skin:    General: Skin is warm and dry.  Neurological:     Mental Status: She is alert.    Cranial nerves II through XII are grossly intact.  Sensation is intact throughout all extremities to light touch.  Patient has full strength throughout all extremities.  Visual acuity is 2030 in the right eye and 20/15 on the left eye.  Intraocular pressure is 23 in the left eye and 19 in the right eye.  Pocus exam performed at bedside that did not show any findings consistent with a retinal detachment.  ED Treatments / Results  Labs (all labs ordered are listed, but only abnormal results are displayed) Labs Reviewed  BASIC METABOLIC PANEL - Abnormal; Notable for the following components:      Result Value   Potassium 3.4 (*)    BUN 6 (*)    All other components within normal limits  CBC - Abnormal; Notable for the following components:   WBC 3.5 (*)    All other components within normal limits  I-STAT TROPONIN, ED  I-STAT TROPONIN, ED    EKG None  Radiology Dg Chest 2 View  Result Date: 08/22/2018 CLINICAL DATA:  Chest pain EXAM: CHEST - 2 VIEW COMPARISON:  03/20/2018 chest radiograph. FINDINGS: Stable cardiomediastinal silhouette with normal heart size. No pneumothorax. No pleural effusion. Lungs appear clear, with no acute consolidative airspace disease and no pulmonary edema. IMPRESSION: No active cardiopulmonary disease. Electronically Signed   By: Ilona Sorrel M.D.   On: 08/22/2018 19:16   Ct Angio Chest Pe W And/or Wo Contrast  Result Date: 08/22/2018 CLINICAL DATA:  Chest pain. EXAM: CT ANGIOGRAPHY CHEST WITH CONTRAST TECHNIQUE: Multidetector CT imaging of the chest was performed using the standard protocol during bolus administration of intravenous contrast. Multiplanar CT image reconstructions and MIPs were obtained to evaluate the vascular anatomy. CONTRAST:  65 mL ISOVUE-370 IOPAMIDOL (ISOVUE-370) INJECTION 76% COMPARISON:  Radiographs of same day.  CT scan of January 28, 2018.  FINDINGS: Cardiovascular: Satisfactory opacification of the pulmonary arteries to the segmental level. No evidence of pulmonary embolism. Normal heart size. No pericardial effusion. Mediastinum/Nodes: No enlarged mediastinal, hilar, or axillary lymph nodes. Thyroid gland, trachea, and esophagus  demonstrate no significant findings. Lungs/Pleura: Lungs are clear. No pleural effusion or pneumothorax. Upper Abdomen: No acute abnormality. Musculoskeletal: No chest wall abnormality. No acute or significant osseous findings. Review of the MIP images confirms the above findings. IMPRESSION: No evidence of pulmonary embolus. No acute abnormality seen in the chest. Electronically Signed   By: Marijo Conception, M.D.   On: 08/22/2018 21:14    Procedures Procedures (including critical care time)  Medications Ordered in ED Medications  iopamidol (ISOVUE-370) 76 % injection (has no administration in time range)  tetracaine (PONTOCAINE) 0.5 % ophthalmic solution 1-2 drop (2 drops Both Eyes Given 08/22/18 2010)  aspirin tablet 325 mg (325 mg Oral Given 08/22/18 2030)  iopamidol (ISOVUE-370) 76 % injection 100 mL (100 mLs Intravenous Contrast Given 08/22/18 2039)     Initial Impression / Assessment and Plan / ED Course  I have reviewed the triage vital signs and the nursing notes.  Pertinent labs & imaging results that were available during my care of the patient were reviewed by me and considered in my medical decision making (see chart for details).     Patient is a 62 year old female who presents with above-stated history exam.  On presentation patient is afebrile stable vital signs.  Exam as above remarkable for mild decreased acuity in the right eye with otherwise no focal neurological deficits or ocular findings.  In addition patient lungs are clear to auscultation bilaterally and she has intact extremity pulses and a soft abdomen.  Regarding the patient's eye complaint on-call ophthalmologist Dr/ Brigitte Pulse was  paged and we discussed the patient.  Ophthalmologist recommended outpatient follow-up tomorrow.  History exam is not consistent with pre-or post septal cellulitis, acute angle-closure glaucoma, temporal arteritis, central retinal vein occlusion, central retinal artery occlusion, or retinal detachment.  In addition history and exam is not consistent with any acute traumatic injuries to the eye.  Regarding the patient's chest pain ECG shows normal sinus rhythm with no signs of acute ischemic change.  I have a low suspicion for ACS given initial and 3-hour troponin is undetectable.  In addition I have a low suspicion for a PE, pneumonia, pneumothorax given CTA chest does not show any findings consistent with these pathologies.  History exam is not consistent with aortic dissection, pericarditis, myocarditis, traumatic injury, or other imminently life-threatening intrathoracic pathology.  Patient discharged in stable condition.  Strict return precautions advised and discussed.  Patient instructed on close follow-up tomorrow with ophthalmology.  Patient voiced understanding and agreement with this plan.  All questions addressed.  Instructed follow-up with her PCP regarding her chest pain in 2 to 3 days.  Final Clinical Impressions(s) / ED Diagnoses   Final diagnoses:  Chest pain, unspecified type  Subjective visual disturbance of right eye    ED Discharge Orders    None       Hulan Saas, MD 08/23/18 0001    Little, Wenda Overland, MD 08/27/18 1109

## 2018-08-22 NOTE — ED Notes (Signed)
Pt transported to xray 

## 2018-08-22 NOTE — ED Triage Notes (Addendum)
Pt sent from [facility].  Reports pressure across chest since last night.  Denies SOB, nausea, or vomiting.  While at [facility] today (around 2pm) pt started seeing brown swiggly lines in R eye which have continued with intermittent white flashing stars.  Steroid injection on Friday for shoulder pain.  Denies eye pain but reports eye irritation.

## 2018-08-22 NOTE — ED Notes (Signed)
Patient transported to CT 

## 2018-08-23 ENCOUNTER — Ambulatory Visit
Admission: RE | Admit: 2018-08-23 | Discharge: 2018-08-23 | Disposition: A | Discharge status: Home / Self Care | Payer: BLUE CROSS/BLUE SHIELD | Source: Ambulatory Visit | Attending: Obstetrics and Gynecology | Admitting: Obstetrics and Gynecology

## 2018-08-23 ENCOUNTER — Other Ambulatory Visit: Payer: Self-pay | Admitting: Obstetrics and Gynecology

## 2018-08-23 DIAGNOSIS — F4322 Adjustment disorder with anxiety: Secondary | ICD-10-CM | POA: Diagnosis not present

## 2018-08-23 DIAGNOSIS — E280 Estrogen excess: Secondary | ICD-10-CM

## 2018-08-23 DIAGNOSIS — H43811 Vitreous degeneration, right eye: Secondary | ICD-10-CM | POA: Diagnosis not present

## 2018-08-23 DIAGNOSIS — N83201 Unspecified ovarian cyst, right side: Secondary | ICD-10-CM | POA: Diagnosis not present

## 2018-08-24 ENCOUNTER — Ambulatory Visit (INDEPENDENT_AMBULATORY_CARE_PROVIDER_SITE_OTHER): Payer: BLUE CROSS/BLUE SHIELD | Admitting: Family Medicine

## 2018-08-24 ENCOUNTER — Encounter: Payer: Self-pay | Admitting: Family Medicine

## 2018-08-24 ENCOUNTER — Other Ambulatory Visit: Payer: Self-pay | Admitting: Oncology

## 2018-08-24 VITALS — BP 98/63 | HR 70 | Ht 70.0 in | Wt 130.0 lb

## 2018-08-24 DIAGNOSIS — M25511 Pain in right shoulder: Secondary | ICD-10-CM

## 2018-08-24 DIAGNOSIS — G8929 Other chronic pain: Secondary | ICD-10-CM

## 2018-08-24 DIAGNOSIS — D801 Nonfamilial hypogammaglobulinemia: Secondary | ICD-10-CM

## 2018-08-24 DIAGNOSIS — D72819 Decreased white blood cell count, unspecified: Secondary | ICD-10-CM

## 2018-08-24 NOTE — Progress Notes (Signed)
Patient returned today to review her shoulder MRI results in person.  Consistent with adhesive capsulitis.  She received intraarticular injection a few days ago - not noticed much benefit yet.  She is doing PT - going to back down to once a week - we reviewed home exercises for this.  Advised she follow up with me or Dr. Paulla Fore in about 3.5 weeks if she wants to go ahead with repeat injection.

## 2018-08-26 DIAGNOSIS — H43392 Other vitreous opacities, left eye: Secondary | ICD-10-CM | POA: Diagnosis not present

## 2018-08-26 DIAGNOSIS — H2513 Age-related nuclear cataract, bilateral: Secondary | ICD-10-CM | POA: Diagnosis not present

## 2018-08-26 DIAGNOSIS — H43811 Vitreous degeneration, right eye: Secondary | ICD-10-CM | POA: Diagnosis not present

## 2018-08-26 DIAGNOSIS — H40013 Open angle with borderline findings, low risk, bilateral: Secondary | ICD-10-CM | POA: Diagnosis not present

## 2018-08-27 DIAGNOSIS — L821 Other seborrheic keratosis: Secondary | ICD-10-CM | POA: Diagnosis not present

## 2018-08-27 DIAGNOSIS — L570 Actinic keratosis: Secondary | ICD-10-CM | POA: Diagnosis not present

## 2018-08-27 DIAGNOSIS — Z85828 Personal history of other malignant neoplasm of skin: Secondary | ICD-10-CM | POA: Diagnosis not present

## 2018-08-28 ENCOUNTER — Other Ambulatory Visit: Payer: BLUE CROSS/BLUE SHIELD

## 2018-08-30 DIAGNOSIS — Z77120 Contact with and (suspected) exposure to mold (toxic): Secondary | ICD-10-CM | POA: Diagnosis not present

## 2018-08-31 DIAGNOSIS — R5381 Other malaise: Secondary | ICD-10-CM | POA: Diagnosis not present

## 2018-08-31 DIAGNOSIS — R7989 Other specified abnormal findings of blood chemistry: Secondary | ICD-10-CM | POA: Diagnosis not present

## 2018-08-31 DIAGNOSIS — L650 Telogen effluvium: Secondary | ICD-10-CM | POA: Diagnosis not present

## 2018-08-31 DIAGNOSIS — M25511 Pain in right shoulder: Secondary | ICD-10-CM | POA: Diagnosis not present

## 2018-08-31 DIAGNOSIS — R5383 Other fatigue: Secondary | ICD-10-CM | POA: Diagnosis not present

## 2018-08-31 DIAGNOSIS — N83209 Unspecified ovarian cyst, unspecified side: Secondary | ICD-10-CM | POA: Diagnosis not present

## 2018-09-02 ENCOUNTER — Encounter

## 2018-09-02 ENCOUNTER — Ambulatory Visit: Payer: BLUE CROSS/BLUE SHIELD | Admitting: Family Medicine

## 2018-09-02 DIAGNOSIS — R5381 Other malaise: Secondary | ICD-10-CM | POA: Diagnosis not present

## 2018-09-02 DIAGNOSIS — R5383 Other fatigue: Secondary | ICD-10-CM | POA: Diagnosis not present

## 2018-09-02 DIAGNOSIS — Z77120 Contact with and (suspected) exposure to mold (toxic): Secondary | ICD-10-CM | POA: Diagnosis not present

## 2018-09-02 DIAGNOSIS — R7989 Other specified abnormal findings of blood chemistry: Secondary | ICD-10-CM | POA: Diagnosis not present

## 2018-09-02 DIAGNOSIS — F4322 Adjustment disorder with anxiety: Secondary | ICD-10-CM | POA: Diagnosis not present

## 2018-09-07 DIAGNOSIS — M25511 Pain in right shoulder: Secondary | ICD-10-CM | POA: Diagnosis not present

## 2018-09-08 DIAGNOSIS — D510 Vitamin B12 deficiency anemia due to intrinsic factor deficiency: Secondary | ICD-10-CM | POA: Insufficient documentation

## 2018-09-08 DIAGNOSIS — F4323 Adjustment disorder with mixed anxiety and depressed mood: Secondary | ICD-10-CM | POA: Diagnosis not present

## 2018-09-08 DIAGNOSIS — M792 Neuralgia and neuritis, unspecified: Secondary | ICD-10-CM | POA: Diagnosis not present

## 2018-09-08 DIAGNOSIS — G959 Disease of spinal cord, unspecified: Secondary | ICD-10-CM | POA: Diagnosis not present

## 2018-09-09 DIAGNOSIS — H43811 Vitreous degeneration, right eye: Secondary | ICD-10-CM | POA: Diagnosis not present

## 2018-09-10 ENCOUNTER — Ambulatory Visit: Payer: BLUE CROSS/BLUE SHIELD | Admitting: Family Medicine

## 2018-09-15 DIAGNOSIS — Z77120 Contact with and (suspected) exposure to mold (toxic): Secondary | ICD-10-CM | POA: Diagnosis not present

## 2018-09-15 DIAGNOSIS — R7989 Other specified abnormal findings of blood chemistry: Secondary | ICD-10-CM | POA: Diagnosis not present

## 2018-09-15 DIAGNOSIS — B370 Candidal stomatitis: Secondary | ICD-10-CM | POA: Diagnosis not present

## 2018-09-15 DIAGNOSIS — A4901 Methicillin susceptible Staphylococcus aureus infection, unspecified site: Secondary | ICD-10-CM | POA: Diagnosis not present

## 2018-09-15 DIAGNOSIS — M25511 Pain in right shoulder: Secondary | ICD-10-CM | POA: Diagnosis not present

## 2018-09-15 DIAGNOSIS — F4322 Adjustment disorder with anxiety: Secondary | ICD-10-CM | POA: Diagnosis not present

## 2018-09-16 ENCOUNTER — Ambulatory Visit: Payer: BLUE CROSS/BLUE SHIELD | Admitting: Sports Medicine

## 2018-09-16 ENCOUNTER — Encounter: Payer: Self-pay | Admitting: Sports Medicine

## 2018-09-16 VITALS — BP 102/62 | HR 63 | Ht 70.0 in | Wt 131.2 lb

## 2018-09-16 DIAGNOSIS — M25512 Pain in left shoulder: Secondary | ICD-10-CM

## 2018-09-16 DIAGNOSIS — M25511 Pain in right shoulder: Secondary | ICD-10-CM | POA: Diagnosis not present

## 2018-09-16 DIAGNOSIS — G8929 Other chronic pain: Secondary | ICD-10-CM

## 2018-09-16 DIAGNOSIS — M7501 Adhesive capsulitis of right shoulder: Secondary | ICD-10-CM | POA: Diagnosis not present

## 2018-09-16 NOTE — Progress Notes (Signed)
Megan [name]. [place], [place] at [place] - 63 y.o. female MRN [id number]  Date of birth: 1956/06/22  Visit Date: 09/16/2018   PCP: Lavone Orn, MD   Referred by: Lavone Orn, MD  SUBJECTIVE:  Chief Complaint  Patient presents with  . Right Shoulder - Follow-up    R shoulder pain.  R shld XR- 07/29/18.  R shld MRI- 08/13/18.  R shld injection- 08/20/18.  HEP.  Going to PT    HPI: Patient is here for follow-up of her right adhesive capsulitis.  She has had a great response to the intra-articular injection.  She has had a great improvement in her range of motion as well as only minimal pain at this time.  She never ended up taking the amitriptyline because she is no longer having nighttime disturbances.  She has been continuing to work physical therapy and performing towel stretches for her neck.  REVIEW OF SYSTEMS: Denies fevers, chills, recent weight gain or weight loss.  No night sweats. No significant nighttime awakenings due to this issue. Otherwise 12 point review of systems performed and is negative  HISTORY:  Prior history reviewed and updated per electronic medical record.  Social History   Occupational History    Comment: Designer, jewellery and life coach  Tobacco Use  . Smoking status: Never Smoker  . Smokeless tobacco: Never Used  Substance and Sexual Activity  . Alcohol use: No    Alcohol/week: 0.0 standard drinks  . Drug use: No  . Sexual activity: Not on file   Social History   Social History Narrative   Patient is single and lives alone.   Patient is self-employed, career and life coaching.   Patient drinks two to four cups of caffeine daily.    OBJECTIVE:  VS:  HT:5\' 10"  (177.8 cm)   WT:131 lb 3.2 oz (59.5 kg)  BMI:18.83    BP:102/62  HR:63bpm  TEMP: ( )  RESP:97 %   PHYSICAL EXAM: Right shoulder is markedly improved and range of motion with full overhead reach.  She has only a 60  degree arc while held at 30 degrees of abduction.  Internal and external rotation strength is 5/5.  Normal empty can testing speeds testing and O'Brien's testing strength and pain-free.  No significant pain with axial load and circumduction.   ASSESSMENT  1. Adhesive capsulitis of right shoulder   2. Chronic pain of both shoulders      PROCEDURES:  None  PLAN:  Pertinent additional documentation may be included in corresponding procedure notes, imaging studies, problem based documentation and patient instructions.  Overall she is markedly better.  We will have her continue with home therapeutic exercises and working with physical therapy as indicated.  She is progressing through the stages of adhesive capsulitis quickly now and will likely benefit from ongoing therapy but ultimately I want to emphasize the importance of home rehab.  Activity modifications and the importance of avoiding exacerbating activities (limiting pain to no more than a 4 / 10 during or following activity) recommended and discussed. Discussed red flag symptoms that warrant earlier emergent evaluation and patient voices understanding. >50% of this 25 minutes minute visit spent in direct patient counseling and/or coordination of care. Discussion was focused on education regarding the in discussing the pathoetiology and anticipated clinical course of the above condition.   No orders of the defined types were placed in this encounter.  Lab Orders  No laboratory test(s) ordered today   Imaging Orders  No imaging studies ordered today   Referral Orders  No referral(s) requested today   Return in about 4 weeks (around 10/14/2018).          Gerda Diss, [facility] Physician

## 2018-09-18 DIAGNOSIS — M25511 Pain in right shoulder: Secondary | ICD-10-CM | POA: Diagnosis not present

## 2018-09-21 DIAGNOSIS — F4322 Adjustment disorder with anxiety: Secondary | ICD-10-CM | POA: Diagnosis not present

## 2018-09-22 ENCOUNTER — Ambulatory Visit: Payer: BLUE CROSS/BLUE SHIELD | Admitting: Podiatry

## 2018-09-22 ENCOUNTER — Ambulatory Visit (INDEPENDENT_AMBULATORY_CARE_PROVIDER_SITE_OTHER): Payer: BLUE CROSS/BLUE SHIELD

## 2018-09-22 ENCOUNTER — Other Ambulatory Visit: Payer: Self-pay | Admitting: Podiatry

## 2018-09-22 ENCOUNTER — Encounter: Payer: Self-pay | Admitting: Podiatry

## 2018-09-22 DIAGNOSIS — H40013 Open angle with borderline findings, low risk, bilateral: Secondary | ICD-10-CM | POA: Diagnosis not present

## 2018-09-22 DIAGNOSIS — H43813 Vitreous degeneration, bilateral: Secondary | ICD-10-CM | POA: Diagnosis not present

## 2018-09-22 DIAGNOSIS — L600 Ingrowing nail: Secondary | ICD-10-CM

## 2018-09-22 DIAGNOSIS — Z01419 Encounter for gynecological examination (general) (routine) without abnormal findings: Secondary | ICD-10-CM | POA: Diagnosis not present

## 2018-09-22 DIAGNOSIS — M79674 Pain in right toe(s): Secondary | ICD-10-CM

## 2018-09-22 DIAGNOSIS — H04123 Dry eye syndrome of bilateral lacrimal glands: Secondary | ICD-10-CM | POA: Diagnosis not present

## 2018-09-22 DIAGNOSIS — M7750 Other enthesopathy of unspecified foot: Secondary | ICD-10-CM

## 2018-09-22 DIAGNOSIS — M778 Other enthesopathies, not elsewhere classified: Secondary | ICD-10-CM

## 2018-09-22 DIAGNOSIS — M79675 Pain in left toe(s): Principal | ICD-10-CM

## 2018-09-22 DIAGNOSIS — M779 Enthesopathy, unspecified: Secondary | ICD-10-CM

## 2018-09-22 DIAGNOSIS — Z124 Encounter for screening for malignant neoplasm of cervix: Secondary | ICD-10-CM | POA: Diagnosis not present

## 2018-09-22 DIAGNOSIS — E280 Estrogen excess: Secondary | ICD-10-CM | POA: Diagnosis not present

## 2018-09-22 DIAGNOSIS — Z681 Body mass index (BMI) 19 or less, adult: Secondary | ICD-10-CM | POA: Diagnosis not present

## 2018-09-22 DIAGNOSIS — H2513 Age-related nuclear cataract, bilateral: Secondary | ICD-10-CM | POA: Diagnosis not present

## 2018-09-23 DIAGNOSIS — M25511 Pain in right shoulder: Secondary | ICD-10-CM | POA: Diagnosis not present

## 2018-09-24 ENCOUNTER — Telehealth: Payer: Self-pay | Admitting: *Deleted

## 2018-09-24 NOTE — Telephone Encounter (Signed)
Called and left the patient a message to call the office back. Need to schedule the patient for a new patient appt  

## 2018-09-24 NOTE — Telephone Encounter (Signed)
Patient called back and scheduled an appt to see Dr. Denman George on 2/4. Patient give the information for the appt, free valet and pelvic exam

## 2018-09-27 ENCOUNTER — Telehealth: Payer: Self-pay | Admitting: *Deleted

## 2018-09-27 NOTE — Telephone Encounter (Signed)
Called and moved her appt from 9:30am tomorrow to 11:30am.

## 2018-09-28 ENCOUNTER — Inpatient Hospital Stay: Payer: BLUE CROSS/BLUE SHIELD | Attending: Gynecologic Oncology

## 2018-09-28 ENCOUNTER — Encounter: Payer: Self-pay | Admitting: Gynecologic Oncology

## 2018-09-28 ENCOUNTER — Inpatient Hospital Stay: Payer: BLUE CROSS/BLUE SHIELD | Admitting: Gynecologic Oncology

## 2018-09-28 VITALS — BP 104/48 | HR 70 | Temp 97.8°F | Resp 18 | Ht 69.5 in | Wt 130.0 lb

## 2018-09-28 DIAGNOSIS — N83201 Unspecified ovarian cyst, right side: Secondary | ICD-10-CM

## 2018-09-28 DIAGNOSIS — R7889 Finding of other specified substances, not normally found in blood: Secondary | ICD-10-CM | POA: Insufficient documentation

## 2018-09-28 DIAGNOSIS — R971 Elevated cancer antigen 125 [CA 125]: Secondary | ICD-10-CM

## 2018-09-28 DIAGNOSIS — N838 Other noninflammatory disorders of ovary, fallopian tube and broad ligament: Secondary | ICD-10-CM

## 2018-09-28 NOTE — Progress Notes (Signed)
Consult Note: Gyn-Onc  Consult was requested by Dr. Ronita Hipps  for the evaluation of Megan [name] 63 y.o. female  CC:  Chief Complaint  Patient presents with  . right ovarian cyst  . elevated estradiol    Assessment/Plan:  Megan. IMANI [name]  is a 63 y.o.  year old with an elevated estradiol discovered incidentally, and a 2+cm simple appearing right ovarian cyst seen on MRI.   I discussed with the patient that only fibromatous for stromal cell tumor such as fibromas, fibrothecoma's, or granulosa cell tumors are associated with elevated estradiol levels, however these are almost always associated with a reflexively high inhibin B level, however her in the inhibin B level is low which is suggestive of this not being a primary ovarian tumor process.  She takes many supplements from her holistic health care provider and is most likely from exogenous sources that her estradiol is high.  However given her concern for a possible ovarian lesion I do believe that further work-up of the right adnexal mass is appropriate.  Given that it was not seen on ultrasound but was seen on MRI I think it is reasonable to repeat the MRI in 3 months time and we have ordered this.  I will see her back after that scan.  If it remains stable in size we can consider either 1 more follow-up evaluation or alternatively suspending evaluations.  We will also check tumor markers Ca1 25 today which is a better marker of epithelial ovarian cancer.  I offered her surgical excision with minimally invasive right salpingo-oophorectomy frozen section and staging including hysterectomy if malignancy is identified.  I explained that while I think this would be technically feasible given her good general health, body habitus, no prior abdominal surgeries, they would be anticipated surgical recovery and some potential risk involved and given my low suspicion for malignancy not entirely certain that this is justified.  The patient is in  agreement and would prefer to the most conservative approach and therefore desires a watchful waiting approach with serial imaging.  I will see her back in approximately 3 months following her repeat MRI and we will review it for change, and reevaluate her Ca1 25 to determine if surgical intervention is warranted at that time.   HPI: Megan [name] is a 63 year old P0 who was seen in consultation at the request of Dr.Taavon for a right ovarian cyst and elevated estradiol level the patient sees a holistic provider and was having serial surveillance estradiol levels drawn.  These were elevated at [place] however when tested at [facility] they were within normal limits.  Of note her TSH was elevated at 5.6 on March 29, 2018, on that same day her estradiol was 162.5 which is at a premenopausal luteal phase level.  And an [place] drawn that same day was 48.8 which is at a postmenopausal level.  Her serum testosterone was low and free testosterone within normal limits. Inhibin A on 03/29/18 was normal at 2.4 (postmenopausal level).   Review of her Pap cytology reveals a normal cytology with negative high risk HPV in January 2019.  She underwent a transvaginal ultrasound at Dr. Kennith Maes office which was normal, though they could not identify/visualize ovaries due to the bowel gas pattern.  Due to the persistently elevated estrogen levels she underwent an MRI of the pelvis on August 23, 2018.  This revealed normal bladder, bowel, lymphatics, uterus.  The left ovary was unremarkable.  The right ovary  was notable for 2.8 x 2.5 cm simple cyst, likely physiologic.  There is trace pelvic ascites present.  No IV contrast was administered at the patient's request.  She denies symptoms concerning for ovarian cancer such as bloating, abdominal distention, or pain.  She denies vaginal bleeding which is a symptom commonly present with an estrogen secreting ovarian tumor.  Current Meds:  Outpatient Encounter  Medications as of 09/28/2018  Medication Sig  . Alpha-Lipoic Acid 300 MG TABS Take 600 mg by mouth daily.  . AMINO ACIDS COMPLEX PO Take 1 Scoop by mouth daily.   Francia Greaves THYROID 30 MG tablet Take 30 mg by mouth daily.  . Ascorbic Acid (VITAMIN C PO) Take 4,000 Units by mouth daily.  [name] BIOTIN PO Take 20,000 mcg by mouth daily.   . Cholecalciferol (VITAMIN D3) 10000 units capsule Take 20,000 Units by mouth daily.   . Cyanocobalamin (VITAMIN B-12 ER PO) Take 2,500 mcg by mouth daily.   . Digestive Enzymes (DIGESTIVE ENZYME PO) Take 1 tablet by mouth daily.  [name] MAGNESIUM CITRATE PO Take 800 mg by mouth daily.   . Menaquinone-7 (VITAMIN K2 PO) Take 1 tablet by mouth daily.  . Omega-3 Fatty Acids (FISH OIL) 1000 MG CAPS Take 1,000 mg by mouth daily.  . Probiotic Product (PROBIOTIC DAILY PO) Take 1 capsule by mouth daily.   [name] 5-Hydroxytryptophan (5-HTP) 50 MG CAPS Take 50 mg by mouth daily.   Facility-Administered Encounter Medications as of 09/28/2018  Medication  . 0.9 %  sodium chloride infusion    Allergy: No Known Allergies  Social Hx:   Social History   Socioeconomic History  . Marital status: Divorced    Spouse name: Not on file  . Number of children: 0  . Years of education: Not on file  . Highest education level: Not on file  Occupational History    Comment: Career and life coach  Social Needs  . Financial resource strain: Not on file  . Food insecurity:    Worry: Not on file    Inability: Not on file  . Transportation needs:    Medical: Not on file    Non-medical: Not on file  Tobacco Use  . Smoking status: Never Smoker  . Smokeless tobacco: Never Used  Substance and Sexual Activity  . Alcohol use: No    Alcohol/week: 0.0 standard drinks  . Drug use: No  . Sexual activity: Not on file  Lifestyle  . Physical activity:    Days per week: Not on file    Minutes per session: Not on file  . Stress: Not on file  Relationships  . Social connections:    Talks on phone:  Not on file    Gets together: Not on file    Attends religious service: Not on file    Active member of club or organization: Not on file    Attends meetings of clubs or organizations: Not on file    Relationship status: Not on file  . Intimate partner violence:    Fear of current or ex partner: Not on file    Emotionally abused: Not on file    Physically abused: Not on file    Forced sexual activity: Not on file  Other Topics Concern  . Not on file  Social History Narrative   Patient is single and lives alone.   Patient is self-employed, career and life coaching.   Patient drinks two to four cups of caffeine daily.  Past Surgical Hx:  Past Surgical History:  Procedure Laterality Date  . COLONOSCOPY WITH PROPOFOL N/A 02/22/2013   Procedure: COLONOSCOPY WITH PROPOFOL;  Surgeon: Garlan Fair, MD;  Location: [facility];  Service: Endoscopy;  Laterality: N/A;  . MOUTH SURGERY  01/2012   bone graft in mouth  . NASAL SINUS SURGERY Right 06/12/2014   Procedure: RIGHT ENDOSCOPIC MAXILLARY ANTROSTOMY;  Surgeon: Ascencion Dike, MD;  Location: [facility];  Service: ENT;  Laterality: Right;  . ROOT CANAL  02/27/2012  . TONSILLECTOMY  1962    Past Medical Hx:  Past Medical History:  Diagnosis Date  . Allergy   . Aneurysm of cardiac wall, congenital    a.  septal aneurysm extending into the RVOT (by cardiac MRI in 08/2013)  . Anxiety   . Arthritis   . Asthma   . Depression   . Heart murmur   . Hypothyroidism   . Lyme disease   . MVP (mitral valve prolapse)   . Orthostatic hypotension   . Osteoporosis   . Peripheral neuropathy 06/09/2012  . Plantar fasciitis   . Pneumothorax     Past Gynecological History:  G0, no history of abnormal paps No LMP recorded. Patient is postmenopausal.  Family Hx:  Family History  Problem Relation Age of Onset  . Hyperlipidemia Father   . Heart failure Father   . Prostate cancer Father   . Kidney failure Father   .  Hypertension Father   . Angina Mother   . Hypertension Mother   . Lung cancer Mother   . Colon cancer Mother   . Lung cancer Paternal Grandfather   . Breast cancer Paternal Grandmother   . Diabetes Paternal Grandmother   . Esophageal cancer Neg Hx   . Liver cancer Neg Hx   . Pancreatic cancer Neg Hx   . Rectal cancer Neg Hx   . Stomach cancer Neg Hx     Review of Systems:  Constitutional  Feels well,    ENT Normal appearing ears and nares bilaterally Skin/Breast  No rash, sores, jaundice, itching, dryness Cardiovascular  No chest pain, shortness of breath, or edema  Pulmonary  No cough or wheeze.  Gastro Intestinal  No nausea, vomitting, or diarrhoea. No bright red blood per rectum, no abdominal pain, change in bowel movement, or constipation.  Genito Urinary  No frequency, urgency, dysuria, no bleeding or pain Musculo Skeletal  No myalgia, arthralgia, joint swelling or pain  Neurologic  No weakness, numbness, change in gait,  Psychology  No depression, anxiety, insomnia.   Vitals:  Blood pressure (!) 104/48, pulse 70, temperature 97.8 F (36.6 C), temperature source Oral, resp. rate 18, height 5' 9.5" (1.765 m), weight 130 lb (59 kg), SpO2 99 %.  Physical Exam: WD in NAD Neck  Supple NROM, without any enlargements.  Lymph Node Survey No cervical supraclavicular or inguinal adenopathy Cardiovascular  Pulse normal rate, regularity and rhythm. S1 and S2 normal.  Lungs  Clear to auscultation bilateraly, without wheezes/crackles/rhonchi. Good air movement.  Skin  No rash/lesions/breakdown  Psychiatry  Alert and oriented to person, place, and time  Abdomen  Normoactive bowel sounds, abdomen soft, non-tender and very thin without evidence of hernia.  Back No CVA tenderness Genito Urinary  Vulva/vagina: Normal external female genitalia.   No lesions. No discharge or bleeding.  Bladder/urethra:  No lesions or masses, well supported bladder  Vagina:  normal  Cervix: Normal appearing, no lesions.  Uterus: very small, mobile, no  parametrial involvement or nodularity.  Adnexa: smooth fullness in the right adnexa, no nodularity.   Rectal  Good tone, no masses no cul de sac nodularity.  Extremities  No bilateral cyanosis, clubbing or edema.   Thereasa Solo, MD  09/28/2018, 5:09 PM

## 2018-09-28 NOTE — Patient Instructions (Signed)
Dr Denman George will check your CA 125 tumor marker today. She has ordered a repeat MRI in 3 months. She will see you back to discuss this in 3 months.  Contact her office at [phone] with questions.

## 2018-09-29 ENCOUNTER — Telehealth: Payer: Self-pay

## 2018-09-29 DIAGNOSIS — R7989 Other specified abnormal findings of blood chemistry: Secondary | ICD-10-CM | POA: Diagnosis not present

## 2018-09-29 DIAGNOSIS — N83209 Unspecified ovarian cyst, unspecified side: Secondary | ICD-10-CM | POA: Diagnosis not present

## 2018-09-29 DIAGNOSIS — A4901 Methicillin susceptible Staphylococcus aureus infection, unspecified site: Secondary | ICD-10-CM | POA: Diagnosis not present

## 2018-09-29 DIAGNOSIS — F4322 Adjustment disorder with anxiety: Secondary | ICD-10-CM | POA: Diagnosis not present

## 2018-09-29 LAB — CA 125: Cancer Antigen (CA) 125: 8.9 U/mL (ref 0.0–38.1)

## 2018-09-29 NOTE — Telephone Encounter (Addendum)
LM for Ms Megan [name] stating that the CA-125 from 09-28-18 was WNL at 8.9.  This is good news.  Keep follow up appointments in 3 months as scheduled.

## 2018-09-29 NOTE — Progress Notes (Signed)
   Subjective: Patient presents today for evaluation of pain to the lateral border of the right hallux that began about one month ago. Patient is concerned for possible ingrown nail. Touching the toe increases the pain. She has not done anything for treatment. Patient presents today for further treatment and evaluation.  Past Medical History:  Diagnosis Date  . Allergy   . Aneurysm of cardiac wall, congenital    a.  septal aneurysm extending into the RVOT (by cardiac MRI in 08/2013)  . Anxiety   . Arthritis   . Asthma   . Depression   . Heart murmur   . Hypothyroidism   . Lyme disease   . MVP (mitral valve prolapse)   . Orthostatic hypotension   . Osteoporosis   . Peripheral neuropathy 06/09/2012  . Plantar fasciitis   . Pneumothorax     Objective:  General: Well developed, nourished, in no acute distress, alert and oriented x3   Dermatology: Skin is warm, dry and supple bilateral. Lateral border of the right hallux appears to be erythematous with evidence of an ingrowing nail. Pain on palpation noted to the border of the nail fold. The remaining nails appear unremarkable at this time. There are no open sores, lesions.  Vascular: Dorsalis Pedis artery and Posterior Tibial artery pedal pulses palpable. No lower extremity edema noted.   Neruologic: Grossly intact via light touch bilateral.  Musculoskeletal: Muscular strength within normal limits in all groups bilateral. Normal range of motion noted to all pedal and ankle joints.   Radiographic Exam:  Normal osseous mineralization. Joint spaces preserved. No fracture/dislocation/boney destruction.    Assesement: #1 Paronychia with ingrowing nail lateral border of the right hallux  #2 Pain in toe #3 Incurvated nail  Plan of Care:  1. Patient evaluated.  2. Discussed treatment alternatives and plan of care. Explained nail avulsion procedure and post procedure course to patient. 3. Patient opted for permanent partial nail  avulsion of the lateral border of the right hallux.  4. Prior to procedure, local anesthesia infiltration utilized using 3 ml of a 50:50 mixture of 2% plain lidocaine and 0.5% plain marcaine in a normal hallux block fashion and a betadine prep performed.  5. Partial permanent nail avulsion with chemical matrixectomy performed using [id number] applications of phenol followed by alcohol flush.  6. Light dressing applied. 7. Recommended good shoe gear.  8. Return to clinic as needed.   Edrick Kins, DPM [facility]  Dr. Edrick Kins, [place]                                        [address] [place], [place] [address]                Office [phone]  Fax [phone]

## 2018-09-30 DIAGNOSIS — M25511 Pain in right shoulder: Secondary | ICD-10-CM | POA: Diagnosis not present

## 2018-09-30 NOTE — Telephone Encounter (Signed)
Pt received message regarding the results of CA-125 as noted below.

## 2018-09-30 NOTE — Progress Notes (Signed)
Juanda Bond. [place], [place] [facility] [place] - 63 y.o. female MRN [id number]  Date of birth: Sep 28, 1955  Visit Date: 07/30/2018  PCP: Lavone Orn, MD   Referred by: Lavone Orn, MD  SUBJECTIVE:   Chief Complaint  Patient presents with  . Initial Assessment  . neck and R shoulder pain    Sx started in Sept 2019. She has tried massage therapy, chiropractic care, and is currently in PT. She was prescribed Flexeril but has not taken it. Pain is worse with activity and improves with rest.     HPI: Patient is here for initial evaluation of right shoulder pain.  She has had worsening pain over the last 3 months that is been progressive.  She is losing range of motion.  The pain is currently rated as moderate but can come severe she rates as above said.  She has a mild amount of numbness in the hand that is intermittent and does not seem to go from the shoulder to the hand but it does seem when her shoulder is hurting its more painful.  She has been working physical therapy and has seen other providers who have an upcoming MRI to evaluate for potential adhesive capsulitis.  She is not interested in injection therapy at this time due to the side effects of steroids.  REVIEW OF SYSTEMS: She does have nighttime disturbances due to this as well as numbness and tingling in extremities  Otherwise 12 point review of systems performed and is negative   HISTORY:  Prior history reviewed and updated per electronic medical record.  Patient Active Problem List   Diagnosis Date Noted  . Vitamin D deficiency, unspecified 07/30/2018  . Age-related osteoporosis without current pathological fracture 07/30/2018  . Estrogen excess 06/18/2018  . Hypogammaglobulinemia ([place]) 01/11/2018    Decreased IgG, borderline decrease IgA, low normal IgM   . Right hip pain 12/30/2017  . Bilateral ankle pain 11/09/2017  . Hypothyroidism 10/27/2017   . Hair loss 04/25/2017  . Neuropathic pain of left lower extremity 04/22/2017  . Great toe pain, left 04/10/2017  . Precordial chest pain 12/17/2016  . Aneurysm of right ventricle of heart 12/17/2016  . Chronic fatigue 03/05/2016  . Neuropathy associated with MGUS ([place]) 12/26/2013  . Orthostatic hypotension 08/01/2013  . Mitral valve prolapse 08/01/2013  . Spider veins of limb 02/15/2013  . Leukopenia 05/13/2012    Chronic, normal differential extensive collagen vasc eval neg   . Bilateral Dorsal Foot pain 04/23/2011  . Gait abnormality 04/23/2011  . Leg length inequality 04/23/2011    The right was 1.5 to 2 cms shorter   . ASTHMA 11/16/2008    Qualifier: Diagnosis of  By: Halford Chessman MD, [place]     . Osteoporosis 10/19/2008    Qualifier: Diagnosis of  By: T[place]en Dome     . Nonspecific (abnormal) findings on radiological and other examination of body structure 07/24/2008    Qualifier: Diagnosis of By: Halford Chessman MD, [place]   Problem list entry automatically replaced. Please review for accuracy.   . ABNORMAL CHEST XRAY 07/24/2008    Qualifier: Diagnosis of  By: Halford Chessman MD, [place]      Social History   Occupational History    Comment: Career and life coach  Tobacco Use  . Smoking status: Never Smoker  . Smokeless tobacco: Never Used  Substance and Sexual Activity  . Alcohol use: No    Alcohol/week: 0.0 standard drinks  .  Drug use: No  . Sexual activity: Not on file   Social History   Social History Narrative   Patient is single and lives alone.   Patient is self-employed, career and life coaching.   Patient drinks two to four cups of caffeine daily.   Past Medical History:  Diagnosis Date  . Allergy   . Aneurysm of cardiac wall, congenital    a.  septal aneurysm extending into the RVOT (by cardiac MRI in 08/2013)  . Anxiety   . Arthritis   . Asthma   . Depression   . Heart murmur   . Hypothyroidism   . Lyme disease   . MVP (mitral valve prolapse)   .  Orthostatic hypotension   . Osteoporosis   . Peripheral neuropathy 06/09/2012  . Plantar fasciitis   . Pneumothorax    Past Surgical History:  Procedure Laterality Date  . COLONOSCOPY WITH PROPOFOL N/A 02/22/2013   Procedure: COLONOSCOPY WITH PROPOFOL;  Surgeon: Garlan Fair, MD;  Location: [facility];  Service: Endoscopy;  Laterality: N/A;  . MOUTH SURGERY  01/2012   bone graft in mouth  . NASAL SINUS SURGERY Right 06/12/2014   Procedure: RIGHT ENDOSCOPIC MAXILLARY ANTROSTOMY;  Surgeon: Ascencion Dike, MD;  Location: [facility];  Service: ENT;  Laterality: Right;  . ROOT CANAL  02/27/2012  . TONSILLECTOMY  1962   family history includes Angina in her mother; Breast cancer in her paternal grandmother; Colon cancer in her mother; Diabetes in her paternal grandmother; Heart failure in her father; Hyperlipidemia in her father; Hypertension in her father and mother; Kidney failure in her father; Lung cancer in her mother and paternal grandfather; Prostate cancer in her father. There is no history of Esophageal cancer, Liver cancer, Pancreatic cancer, Rectal cancer, or Stomach cancer.  OBJECTIVE:  VS:  HT:5\' 10"  (177.8 cm)   WT:131 lb 3.2 oz (59.5 kg)  BMI:18.83    BP:96/64  HR:63bpm  TEMP: ( )  RESP:98 %   PHYSICAL EXAM: CONSTITUTIONAL: Well-developed, Well-nourished and In no acute distress EYES: Pupils are equal., EOM intact without nystagmus. and No scleral icterus. Psychiatric: Alert & appropriately interactive. and Not depressed or anxious appearing. EXTREMITY EXAM: Warm and well perfused  Right upper extremity and neck are overall well aligned with slightly limited cervical range of motion but overall well-maintained.  She has marked limitations in her shoulder range of motion and with 30 degrees of abduction and has only a 50 to 60 degree arc.  Her intrinsic rotator cuff strength is intact with internal, external rotation.  Pain with empty can testing due to  positioning but strength is intact.  She does have tenderness within her infraspinatus and serratus posterior muscles.  Subscap is tender as well   ASSESSMENT:   1. Chronic right shoulder pain   2. Adhesive capsulitis of right shoulder   3. Somatic dysfunction of upper extremity   4. Somatic dysfunction of cervical region   5. Somatic dysfunction of thoracic region     PROCEDURES:  PROCEDURE NOTE: THERAPEUTIC EXERCISES (54650)  Discussed the foundation of treatment for this condition is physical therapy and/or daily (5-6 days/week) therapeutic exercises, focusing on core strengthening, coordination, neuromuscular control/reeducation. 15 minutes spent for Therapeutic exercises as below and as referenced in the AVS. This included exercises focusing on stretching, strengthening, with significant focus on eccentric aspects.  Proper technique shown and discussed handout in great detail with ATC. All questions were discussed and answered.  Long term goals  include an improvement in range of motion, strength, endurance as well as avoiding reinjury. Frequency of visits is one time as determined during today's office visit. Frequency of exercises to be performed is as per handout. EXERCISES REVIEWED: Cervical Towel Stretching Exercises  PROCEDURE NOTE : OSTEOPATHIC MANIPULATION The decision today to treat with Osteopathic Manipulative Therapy (OMT) was based on physical exam findings. Verbal consent was obtained following a discussion with the patient regarding the of risks, benefits and potential side effects, including an acute pain flare,post manipulation soreness and need for repeat treatments. Additionally, we specifically discussed the minimal risk of  injury to neurovascular structures associated with Cervical manipulation.   Contraindications to OMT: NONE  Manipulation was performed as below: Regions Treated & Osteopathic Exam Findings  CERVICAL SPINE: OA - rotated right THORACIC SPINE:    T2 - 6 Neutral, rotated LEFT, sidebent RIGHT Upper extremities: Infraspinatus trigger point.  Marked limitations in glenohumeral range of motion.  Periscapular muscle spasm.   OMT Techniques Used  HVLA muscle energy myofascial release soft tissue    The patient tolerated the treatment well and reported Improved symptoms following treatment today. Patient was given medications, exercises, stretches and lifestyle modifications per AVS and verbally.     PLAN:  Pertinent additional documentation may be included in corresponding procedure notes, imaging studies, problem based documentation and patient instructions.  No problem-specific Assessment & Plan notes found for this encounter.   We discussed in detail the options for intervention at this point she would like to still continue to avoid injection therapy but is open to dry needling.  Recommendations for this were provided.  We did discuss that adhesive capsulitis is a progressive ongoing issue that progresses to the appropriate stages.  This was explained in detail to her.  Currently she unfortunately is worsening and intervention may be needed but she would like to hold off until she has further information from the MRI.  Home Therapeutic exercises prescribed today per procedure note.  Osteopathic manipulation was performed today based on physical exam findings.  Patient was counseled on the purpose and expected outcome of osteopathic manipulation and understands that a single treatment may not provide permanent long lasting relief.  They understand that home therapeutic exercises are critical part of the healing/treatment process and will continue with self treatment between now and their next visit as outlined.  The patient understands that the frequency of visits is meant to provide a stimulus to promote the body's own ability to heal and is not meant to be the sole means for improvement in their symptoms.  Activity modifications  and the importance of avoiding exacerbating activities (limiting pain to no more than a 4 / 10 during or following activity) recommended and discussed.  Discussed red flag symptoms that warrant earlier emergent evaluation and patient voices understanding.   No orders of the defined types were placed in this encounter.  Lab Orders  No laboratory test(s) ordered today   Imaging Orders  No imaging studies ordered today   Referral Orders  No referral(s) requested today    Return in about 2 weeks (around 08/13/2018) for consideration of repeat Osteopathic Manipulation.          Gerda Diss, [facility] Physician

## 2018-10-01 ENCOUNTER — Encounter: Payer: Self-pay | Admitting: Sports Medicine

## 2018-10-02 ENCOUNTER — Encounter: Payer: Self-pay | Admitting: Sports Medicine

## 2018-10-04 DIAGNOSIS — R7989 Other specified abnormal findings of blood chemistry: Secondary | ICD-10-CM | POA: Diagnosis not present

## 2018-10-07 DIAGNOSIS — M25511 Pain in right shoulder: Secondary | ICD-10-CM | POA: Diagnosis not present

## 2018-10-07 DIAGNOSIS — F4322 Adjustment disorder with anxiety: Secondary | ICD-10-CM | POA: Diagnosis not present

## 2018-10-12 DIAGNOSIS — J320 Chronic maxillary sinusitis: Secondary | ICD-10-CM | POA: Diagnosis not present

## 2018-10-12 DIAGNOSIS — J310 Chronic rhinitis: Secondary | ICD-10-CM | POA: Diagnosis not present

## 2018-10-12 DIAGNOSIS — J0101 Acute recurrent maxillary sinusitis: Secondary | ICD-10-CM | POA: Diagnosis not present

## 2018-10-12 DIAGNOSIS — H9209 Otalgia, unspecified ear: Secondary | ICD-10-CM | POA: Diagnosis not present

## 2018-10-14 DIAGNOSIS — F4322 Adjustment disorder with anxiety: Secondary | ICD-10-CM | POA: Diagnosis not present

## 2018-10-20 ENCOUNTER — Ambulatory Visit: Payer: BLUE CROSS/BLUE SHIELD | Admitting: Sports Medicine

## 2018-10-21 DIAGNOSIS — F4322 Adjustment disorder with anxiety: Secondary | ICD-10-CM | POA: Diagnosis not present

## 2018-10-27 ENCOUNTER — Ambulatory Visit: Payer: BLUE CROSS/BLUE SHIELD | Admitting: Internal Medicine

## 2018-10-27 ENCOUNTER — Other Ambulatory Visit: Payer: Self-pay

## 2018-10-27 ENCOUNTER — Encounter: Payer: Self-pay | Admitting: Internal Medicine

## 2018-10-27 VITALS — BP 103/69 | HR 70 | Temp 98.0°F | Wt 130.0 lb

## 2018-10-27 DIAGNOSIS — R5382 Chronic fatigue, unspecified: Secondary | ICD-10-CM

## 2018-10-27 DIAGNOSIS — K1379 Other lesions of oral mucosa: Secondary | ICD-10-CM | POA: Diagnosis not present

## 2018-10-27 DIAGNOSIS — R300 Dysuria: Secondary | ICD-10-CM | POA: Diagnosis not present

## 2018-10-27 DIAGNOSIS — Q383 Other congenital malformations of tongue: Secondary | ICD-10-CM | POA: Diagnosis not present

## 2018-10-27 MED ORDER — FLUCONAZOLE 200 MG PO TABS: 200.0000 mg | ORAL_TABLET | Freq: Every day | ORAL | 0 refills | Status: DC

## 2018-10-27 NOTE — Patient Instructions (Signed)
We will reach out to your neurologist to see if need follow up appt here.

## 2018-10-28 ENCOUNTER — Ambulatory Visit: Payer: BLUE CROSS/BLUE SHIELD | Admitting: Internal Medicine

## 2018-10-28 DIAGNOSIS — F4322 Adjustment disorder with anxiety: Secondary | ICD-10-CM | POA: Diagnosis not present

## 2018-10-29 ENCOUNTER — Ambulatory Visit: Payer: BLUE CROSS/BLUE SHIELD | Admitting: Sports Medicine

## 2018-11-03 DIAGNOSIS — M810 Age-related osteoporosis without current pathological fracture: Secondary | ICD-10-CM | POA: Diagnosis not present

## 2018-11-07 ENCOUNTER — Encounter: Payer: Self-pay | Admitting: Gynecologic Oncology

## 2018-11-08 ENCOUNTER — Ambulatory Visit: Payer: BLUE CROSS/BLUE SHIELD | Admitting: Sports Medicine

## 2018-11-08 ENCOUNTER — Telehealth: Payer: Self-pay | Admitting: *Deleted

## 2018-11-08 NOTE — Telephone Encounter (Signed)
Sent the patient a my chart message that we will move her appts and call her next week with new appts.

## 2018-11-09 ENCOUNTER — Ambulatory Visit: Payer: BLUE CROSS/BLUE SHIELD | Admitting: Internal Medicine

## 2018-11-16 ENCOUNTER — Encounter: Payer: Self-pay | Admitting: *Deleted

## 2018-11-16 ENCOUNTER — Telehealth: Payer: Self-pay | Admitting: *Deleted

## 2018-11-16 NOTE — Telephone Encounter (Signed)
Called and rescheduled the patient MRI scan. Called and spoke with the patient regarding the new date/time of the scan.

## 2018-11-18 DIAGNOSIS — F4322 Adjustment disorder with anxiety: Secondary | ICD-10-CM | POA: Diagnosis not present

## 2018-11-24 DIAGNOSIS — F4322 Adjustment disorder with anxiety: Secondary | ICD-10-CM | POA: Diagnosis not present

## 2018-11-26 ENCOUNTER — Telehealth: Payer: Self-pay | Admitting: Hematology

## 2018-11-26 ENCOUNTER — Encounter: Payer: Self-pay | Admitting: Hematology

## 2018-11-26 NOTE — Telephone Encounter (Signed)
Pt cld to reschedule appt with Dr. Irene Limbo to 7/9 at 1pm.

## 2018-11-26 NOTE — Telephone Encounter (Signed)
A new hem appt has been scheduled for the pt to see Dr. Irene Limbo on 5/5 at 1pm. Letter mailed.

## 2018-11-29 DIAGNOSIS — R05 Cough: Secondary | ICD-10-CM | POA: Diagnosis not present

## 2018-11-29 DIAGNOSIS — R51 Headache: Secondary | ICD-10-CM | POA: Diagnosis not present

## 2018-12-01 DIAGNOSIS — R808 Other proteinuria: Secondary | ICD-10-CM | POA: Insufficient documentation

## 2018-12-01 DIAGNOSIS — F4322 Adjustment disorder with anxiety: Secondary | ICD-10-CM | POA: Diagnosis not present

## 2018-12-08 DIAGNOSIS — F4322 Adjustment disorder with anxiety: Secondary | ICD-10-CM | POA: Diagnosis not present

## 2018-12-15 DIAGNOSIS — F4322 Adjustment disorder with anxiety: Secondary | ICD-10-CM | POA: Diagnosis not present

## 2018-12-23 DIAGNOSIS — H43813 Vitreous degeneration, bilateral: Secondary | ICD-10-CM | POA: Diagnosis not present

## 2018-12-23 DIAGNOSIS — H40013 Open angle with borderline findings, low risk, bilateral: Secondary | ICD-10-CM | POA: Diagnosis not present

## 2018-12-23 DIAGNOSIS — H04123 Dry eye syndrome of bilateral lacrimal glands: Secondary | ICD-10-CM | POA: Diagnosis not present

## 2018-12-23 DIAGNOSIS — H2513 Age-related nuclear cataract, bilateral: Secondary | ICD-10-CM | POA: Diagnosis not present

## 2018-12-27 ENCOUNTER — Ambulatory Visit (HOSPITAL_COMMUNITY): Payer: BLUE CROSS/BLUE SHIELD

## 2018-12-27 DIAGNOSIS — F4322 Adjustment disorder with anxiety: Secondary | ICD-10-CM | POA: Diagnosis not present

## 2018-12-28 ENCOUNTER — Encounter: Payer: BLUE CROSS/BLUE SHIELD | Admitting: Hematology

## 2018-12-29 ENCOUNTER — Ambulatory Visit: Payer: BLUE CROSS/BLUE SHIELD | Admitting: Gynecologic Oncology

## 2018-12-30 DIAGNOSIS — Z85828 Personal history of other malignant neoplasm of skin: Secondary | ICD-10-CM | POA: Diagnosis not present

## 2018-12-30 DIAGNOSIS — L820 Inflamed seborrheic keratosis: Secondary | ICD-10-CM | POA: Diagnosis not present

## 2018-12-30 DIAGNOSIS — L814 Other melanin hyperpigmentation: Secondary | ICD-10-CM | POA: Diagnosis not present

## 2018-12-30 DIAGNOSIS — L570 Actinic keratosis: Secondary | ICD-10-CM | POA: Diagnosis not present

## 2018-12-30 DIAGNOSIS — L821 Other seborrheic keratosis: Secondary | ICD-10-CM | POA: Diagnosis not present

## 2018-12-30 DIAGNOSIS — D1801 Hemangioma of skin and subcutaneous tissue: Secondary | ICD-10-CM | POA: Diagnosis not present

## 2019-01-04 ENCOUNTER — Ambulatory Visit: Payer: BLUE CROSS/BLUE SHIELD | Admitting: Sports Medicine

## 2019-01-05 DIAGNOSIS — F4322 Adjustment disorder with anxiety: Secondary | ICD-10-CM | POA: Diagnosis not present

## 2019-01-10 DIAGNOSIS — F4322 Adjustment disorder with anxiety: Secondary | ICD-10-CM | POA: Diagnosis not present

## 2019-01-12 DIAGNOSIS — Z77120 Contact with and (suspected) exposure to mold (toxic): Secondary | ICD-10-CM | POA: Diagnosis not present

## 2019-01-13 ENCOUNTER — Ambulatory Visit: Payer: BLUE CROSS/BLUE SHIELD | Admitting: Cardiology

## 2019-01-24 ENCOUNTER — Telehealth: Payer: Self-pay | Admitting: *Deleted

## 2019-01-24 DIAGNOSIS — Z0000 Encounter for general adult medical examination without abnormal findings: Secondary | ICD-10-CM | POA: Diagnosis not present

## 2019-01-24 DIAGNOSIS — D72819 Decreased white blood cell count, unspecified: Secondary | ICD-10-CM | POA: Diagnosis not present

## 2019-01-24 DIAGNOSIS — M810 Age-related osteoporosis without current pathological fracture: Secondary | ICD-10-CM | POA: Diagnosis not present

## 2019-01-24 DIAGNOSIS — R110 Nausea: Secondary | ICD-10-CM | POA: Diagnosis not present

## 2019-01-24 DIAGNOSIS — E7800 Pure hypercholesterolemia, unspecified: Secondary | ICD-10-CM | POA: Diagnosis not present

## 2019-01-24 DIAGNOSIS — N83209 Unspecified ovarian cyst, unspecified side: Secondary | ICD-10-CM | POA: Diagnosis not present

## 2019-01-24 DIAGNOSIS — E611 Iron deficiency: Secondary | ICD-10-CM | POA: Diagnosis not present

## 2019-01-24 DIAGNOSIS — Z8249 Family history of ischemic heart disease and other diseases of the circulatory system: Secondary | ICD-10-CM | POA: Diagnosis not present

## 2019-01-24 DIAGNOSIS — G629 Polyneuropathy, unspecified: Secondary | ICD-10-CM | POA: Diagnosis not present

## 2019-01-24 NOTE — Telephone Encounter (Signed)
Patient called and scheduled her follow up appt for 7/10

## 2019-01-26 DIAGNOSIS — R110 Nausea: Secondary | ICD-10-CM | POA: Diagnosis not present

## 2019-01-27 DIAGNOSIS — G609 Hereditary and idiopathic neuropathy, unspecified: Secondary | ICD-10-CM | POA: Diagnosis not present

## 2019-01-27 DIAGNOSIS — F4322 Adjustment disorder with anxiety: Secondary | ICD-10-CM | POA: Diagnosis not present

## 2019-01-27 DIAGNOSIS — R5383 Other fatigue: Secondary | ICD-10-CM | POA: Diagnosis not present

## 2019-02-02 DIAGNOSIS — F4322 Adjustment disorder with anxiety: Secondary | ICD-10-CM | POA: Diagnosis not present

## 2019-02-03 DIAGNOSIS — R808 Other proteinuria: Secondary | ICD-10-CM | POA: Diagnosis not present

## 2019-02-04 DIAGNOSIS — H2513 Age-related nuclear cataract, bilateral: Secondary | ICD-10-CM | POA: Diagnosis not present

## 2019-02-04 DIAGNOSIS — H04123 Dry eye syndrome of bilateral lacrimal glands: Secondary | ICD-10-CM | POA: Diagnosis not present

## 2019-02-04 DIAGNOSIS — H40013 Open angle with borderline findings, low risk, bilateral: Secondary | ICD-10-CM | POA: Diagnosis not present

## 2019-02-04 DIAGNOSIS — H43813 Vitreous degeneration, bilateral: Secondary | ICD-10-CM | POA: Diagnosis not present

## 2019-02-10 DIAGNOSIS — F4322 Adjustment disorder with anxiety: Secondary | ICD-10-CM | POA: Diagnosis not present

## 2019-02-14 ENCOUNTER — Telehealth: Payer: Self-pay | Admitting: *Deleted

## 2019-02-14 NOTE — Telephone Encounter (Signed)
error 

## 2019-02-17 DIAGNOSIS — H0102B Squamous blepharitis left eye, upper and lower eyelids: Secondary | ICD-10-CM | POA: Diagnosis not present

## 2019-02-17 DIAGNOSIS — F4322 Adjustment disorder with anxiety: Secondary | ICD-10-CM | POA: Diagnosis not present

## 2019-02-17 DIAGNOSIS — H2513 Age-related nuclear cataract, bilateral: Secondary | ICD-10-CM | POA: Diagnosis not present

## 2019-02-17 DIAGNOSIS — H0102A Squamous blepharitis right eye, upper and lower eyelids: Secondary | ICD-10-CM | POA: Diagnosis not present

## 2019-02-17 DIAGNOSIS — H04123 Dry eye syndrome of bilateral lacrimal glands: Secondary | ICD-10-CM | POA: Diagnosis not present

## 2019-02-18 DIAGNOSIS — H04123 Dry eye syndrome of bilateral lacrimal glands: Secondary | ICD-10-CM | POA: Diagnosis not present

## 2019-02-21 ENCOUNTER — Other Ambulatory Visit: Payer: Self-pay

## 2019-02-21 ENCOUNTER — Ambulatory Visit (HOSPITAL_COMMUNITY)
Admission: RE | Admit: 2019-02-21 | Discharge: 2019-02-21 | Disposition: A | Discharge status: Home / Self Care | Payer: BC Managed Care – PPO | Source: Ambulatory Visit | Attending: Gynecologic Oncology | Admitting: Gynecologic Oncology

## 2019-02-21 DIAGNOSIS — F4322 Adjustment disorder with anxiety: Secondary | ICD-10-CM | POA: Diagnosis not present

## 2019-02-21 DIAGNOSIS — N838 Other noninflammatory disorders of ovary, fallopian tube and broad ligament: Secondary | ICD-10-CM | POA: Diagnosis not present

## 2019-02-21 DIAGNOSIS — N83201 Unspecified ovarian cyst, right side: Secondary | ICD-10-CM | POA: Diagnosis not present

## 2019-02-22 DIAGNOSIS — J310 Chronic rhinitis: Secondary | ICD-10-CM | POA: Diagnosis not present

## 2019-02-22 DIAGNOSIS — J343 Hypertrophy of nasal turbinates: Secondary | ICD-10-CM | POA: Diagnosis not present

## 2019-02-22 DIAGNOSIS — H9313 Tinnitus, bilateral: Secondary | ICD-10-CM | POA: Diagnosis not present

## 2019-02-23 ENCOUNTER — Inpatient Hospital Stay: Payer: BC Managed Care – PPO | Attending: Gynecologic Oncology | Admitting: Gynecologic Oncology

## 2019-02-23 ENCOUNTER — Other Ambulatory Visit: Payer: Self-pay

## 2019-02-23 ENCOUNTER — Encounter: Payer: Self-pay | Admitting: Gynecologic Oncology

## 2019-02-23 VITALS — BP 113/69 | HR 59 | Temp 98.9°F | Resp 18 | Ht 69.5 in | Wt 132.9 lb

## 2019-02-23 DIAGNOSIS — A6920 Lyme disease, unspecified: Secondary | ICD-10-CM | POA: Diagnosis not present

## 2019-02-23 DIAGNOSIS — Z800 Family history of malignant neoplasm of digestive organs: Secondary | ICD-10-CM | POA: Diagnosis not present

## 2019-02-23 DIAGNOSIS — D510 Vitamin B12 deficiency anemia due to intrinsic factor deficiency: Secondary | ICD-10-CM | POA: Diagnosis not present

## 2019-02-23 DIAGNOSIS — M810 Age-related osteoporosis without current pathological fracture: Secondary | ICD-10-CM | POA: Insufficient documentation

## 2019-02-23 DIAGNOSIS — F329 Major depressive disorder, single episode, unspecified: Secondary | ICD-10-CM | POA: Diagnosis not present

## 2019-02-23 DIAGNOSIS — I341 Nonrheumatic mitral (valve) prolapse: Secondary | ICD-10-CM | POA: Diagnosis not present

## 2019-02-23 DIAGNOSIS — I253 Aneurysm of heart: Secondary | ICD-10-CM | POA: Insufficient documentation

## 2019-02-23 DIAGNOSIS — I951 Orthostatic hypotension: Secondary | ICD-10-CM | POA: Insufficient documentation

## 2019-02-23 DIAGNOSIS — D72819 Decreased white blood cell count, unspecified: Secondary | ICD-10-CM | POA: Diagnosis not present

## 2019-02-23 DIAGNOSIS — G629 Polyneuropathy, unspecified: Secondary | ICD-10-CM | POA: Diagnosis not present

## 2019-02-23 DIAGNOSIS — Z803 Family history of malignant neoplasm of breast: Secondary | ICD-10-CM | POA: Diagnosis not present

## 2019-02-23 DIAGNOSIS — E039 Hypothyroidism, unspecified: Secondary | ICD-10-CM | POA: Insufficient documentation

## 2019-02-23 DIAGNOSIS — N83201 Unspecified ovarian cyst, right side: Secondary | ICD-10-CM

## 2019-02-23 NOTE — Progress Notes (Signed)
Follow-up Note: Gyn-Onc  Consult was requested by Dr. Ronita Hipps  for the evaluation of Megan [name] 63 y.o. female  CC:  Chief Complaint  Patient presents with  . right ovarian cyst    follow-up    Assessment/Plan:  Megan [name]  is a 63 y.o.  year old with a history of elevated estradiol discovered incidentally, and a history of a 2+cm simple appearing right ovarian cyst seen on MRI.   Both issues have resolved with cessation of Biotin.  No follow-up scans or labs are indicated.  She will follow-up with Dr Ronita Hipps for routine gyn care.   HPI: Megan [name] is a 63 year old P0 who was seen in consultation at the request of Dr.Taavon for a right ovarian cyst and elevated estradiol level the patient sees a holistic provider and was having serial surveillance estradiol levels drawn.  These were elevated at [facility] however when tested at [facility] they were within normal limits.  Of note her TSH was elevated at 5.6 on March 29, 2018, on that same day her estradiol was 162.5 which is at a premenopausal luteal phase level.  And an [place] drawn that same day was 48.8 which is at a postmenopausal level.  Her serum testosterone was low and free testosterone within normal limits. Inhibin A on 03/29/18 was normal at 2.4 (postmenopausal level).   Review of her Pap cytology reveals a normal cytology with negative high risk HPV in January 2019.  She underwent a transvaginal ultrasound at Dr. Kennith Maes office which was normal, though they could not identify/visualize ovaries due to the bowel gas pattern.  Due to the persistently elevated estrogen levels she underwent an MRI of the pelvis on August 23, 2018.  This revealed normal bladder, bowel, lymphatics, uterus.  The left ovary was unremarkable.  The right ovary was notable for 2.8 x 2.5 cm simple cyst, likely physiologic.  There is trace pelvic ascites present.  No IV contrast was administered at the patient's request.  She denies symptoms  concerning for ovarian cancer such as bloating, abdominal distention, or pain.  She denies vaginal bleeding which is a symptom commonly present with an estrogen secreting ovarian tumor.  Interval Hx:  She realized that it was the Biotin causing her increased estradiol levels and has stopped this.  Repeat MRI was performed on 02/21/19 which showed resolution of her right sided ovarian cyst. No other abnormalities were noted.    Current Meds:  Outpatient Encounter Medications as of 02/23/2019  Medication Sig  . 5-Hydroxytryptophan (5-HTP) 50 MG CAPS Take 50 mg by mouth daily.  . AMINO ACIDS COMPLEX PO Take 1 Scoop by mouth daily.   Francia Greaves THYROID 30 MG tablet Take 30 mg by mouth daily.  . Ascorbic Acid (VITAMIN C PO) Take 4,000 Units by mouth daily.  . Cholecalciferol (VITAMIN D3) 10000 units capsule Take 20,000 Units by mouth daily.   . Cyanocobalamin (VITAMIN B-12 ER PO) Take 2,500 mcg by mouth daily.   . Digestive Enzymes (DIGESTIVE ENZYME PO) Take 1 tablet by mouth daily.  [name] MAGNESIUM CITRATE PO Take 800 mg by mouth daily.   . Menaquinone-7 (VITAMIN K2 PO) Take 1 tablet by mouth daily.  . Omega-3 Fatty Acids (FISH OIL) 1000 MG CAPS Take 1,000 mg by mouth daily.  . Probiotic Product (PROBIOTIC DAILY PO) Take 1 capsule by mouth daily.   . [DISCONTINUED] Alpha-Lipoic Acid 300 MG TABS Take 600 mg by mouth daily.  . [DISCONTINUED] BIOTIN  PO Take 20,000 mcg by mouth daily.   . [DISCONTINUED] fluconazole (DIFLUCAN) 200 MG tablet Take 1 tablet (200 mg total) by mouth daily.   Facility-Administered Encounter Medications as of 02/23/2019  Medication  . 0.9 %  sodium chloride infusion    Allergy: No Known Allergies  Social Hx:   Social History   Socioeconomic History  . Marital status: Divorced    Spouse name: Not on file  . Number of children: 0  . Years of education: Not on file  . Highest education level: Not on file  Occupational History    Comment: Career and life coach  Social  Needs  . Financial resource strain: Not on file  . Food insecurity    Worry: Not on file    Inability: Not on file  . Transportation needs    Medical: Not on file    Non-medical: Not on file  Tobacco Use  . Smoking status: Never Smoker  . Smokeless tobacco: Never Used  Substance and Sexual Activity  . Alcohol use: No    Alcohol/week: 0.0 standard drinks  . Drug use: No  . Sexual activity: Not on file  Lifestyle  . Physical activity    Days per week: Not on file    Minutes per session: Not on file  . Stress: Not on file  Relationships  . Social Herbalist on phone: Not on file    Gets together: Not on file    Attends religious service: Not on file    Active member of club or organization: Not on file    Attends meetings of clubs or organizations: Not on file    Relationship status: Not on file  . Intimate partner violence    Fear of current or ex partner: Not on file    Emotionally abused: Not on file    Physically abused: Not on file    Forced sexual activity: Not on file  Other Topics Concern  . Not on file  Social History Narrative   Patient is single and lives alone.   Patient is self-employed, career and life coaching.   Patient drinks two to four cups of caffeine daily.    Past Surgical Hx:  Past Surgical History:  Procedure Laterality Date  . COLONOSCOPY WITH PROPOFOL N/A 02/22/2013   Procedure: COLONOSCOPY WITH PROPOFOL;  Surgeon: Garlan Fair, MD;  Location: [facility];  Service: Endoscopy;  Laterality: N/A;  . MOUTH SURGERY  01/2012   bone graft in mouth  . NASAL SINUS SURGERY Right 06/12/2014   Procedure: RIGHT ENDOSCOPIC MAXILLARY ANTROSTOMY;  Surgeon: Ascencion Dike, MD;  Location: [facility];  Service: ENT;  Laterality: Right;  . ROOT CANAL  02/27/2012  . TONSILLECTOMY  1962    Past Medical Hx:  Past Medical History:  Diagnosis Date  . Allergy   . Aneurysm of cardiac wall, congenital    a.  septal aneurysm extending  into the RVOT (by cardiac MRI in 08/2013)  . Anxiety   . Arthritis   . Asthma   . Depression   . Heart murmur   . Hypothyroidism   . Lyme disease   . MVP (mitral valve prolapse)   . Orthostatic hypotension   . Osteoporosis   . Peripheral neuropathy 06/09/2012  . Plantar fasciitis   . Pneumothorax     Past Gynecological History:  G0, no history of abnormal paps No LMP recorded. Patient is postmenopausal.  Family Hx:  Family History  Problem Relation Age of Onset  . Hyperlipidemia Father   . Heart failure Father   . Prostate cancer Father   . Kidney failure Father   . Hypertension Father   . Angina Mother   . Hypertension Mother   . Lung cancer Mother   . Colon cancer Mother   . Lung cancer Paternal Grandfather   . Breast cancer Paternal Grandmother   . Diabetes Paternal Grandmother   . Esophageal cancer Neg Hx   . Liver cancer Neg Hx   . Pancreatic cancer Neg Hx   . Rectal cancer Neg Hx   . Stomach cancer Neg Hx     Review of Systems:  Constitutional  Feels well,    ENT Normal appearing ears and nares bilaterally Skin/Breast  No rash, sores, jaundice, itching, dryness Cardiovascular  No chest pain, shortness of breath, or edema  Pulmonary  No cough or wheeze.  Gastro Intestinal  No nausea, vomitting, or diarrhoea. No bright red blood per rectum, no abdominal pain, change in bowel movement, or constipation.  Genito Urinary  No frequency, urgency, dysuria, no bleeding or pain Musculo Skeletal  No myalgia, arthralgia, joint swelling or pain  Neurologic  No weakness, numbness, change in gait,  Psychology  No depression, anxiety, insomnia.   Vitals:  Blood pressure 113/69, pulse (!) 59, temperature 98.9 F (37.2 C), temperature source Oral, resp. rate 18, height 5' 9.5" (1.765 m), weight 132 lb 14.4 oz (60.3 kg), SpO2 100 %.  Physical Exam: WD in NAD Neck  Supple NROM, without any enlargements.  Lymph Node Survey No cervical supraclavicular or inguinal  adenopathy Cardiovascular  Pulse normal rate, regularity and rhythm. S1 and S2 normal.  Lungs  Clear to auscultation bilateraly, without wheezes/crackles/rhonchi. Good air movement.  Skin  No rash/lesions/breakdown  Psychiatry  Alert and oriented to person, place, and time  Abdomen  Normoactive bowel sounds, abdomen soft, non-tender and very thin without evidence of hernia.  Back No CVA tenderness Genito Urinary  deferred Rectal  Good tone, no masses no cul de sac nodularity.  Extremities  No bilateral cyanosis, clubbing or edema.   Thereasa Solo, MD  02/23/2019, 3:16 PM

## 2019-02-23 NOTE — Patient Instructions (Signed)
The MRI showed resolution of the ovarian cyst, therefore no additional testing or scans are necessary.  The contact information for social work at the [employer] is Lanelle Bal at [phone].  These people can help you find out if there is a way you could offer virtual volunteer counseling or support for patients.   Dr Serita Grit office can be reached at [phone].

## 2019-03-01 ENCOUNTER — Encounter: Payer: Self-pay | Admitting: Internal Medicine

## 2019-03-01 ENCOUNTER — Other Ambulatory Visit: Payer: Self-pay

## 2019-03-01 ENCOUNTER — Ambulatory Visit (INDEPENDENT_AMBULATORY_CARE_PROVIDER_SITE_OTHER): Payer: BC Managed Care – PPO | Admitting: Internal Medicine

## 2019-03-01 VITALS — BP 110/70 | HR 60 | Ht 69.5 in | Wt 134.0 lb

## 2019-03-01 DIAGNOSIS — M810 Age-related osteoporosis without current pathological fracture: Secondary | ICD-10-CM | POA: Diagnosis not present

## 2019-03-01 DIAGNOSIS — E039 Hypothyroidism, unspecified: Secondary | ICD-10-CM | POA: Diagnosis not present

## 2019-03-01 DIAGNOSIS — E280 Estrogen excess: Secondary | ICD-10-CM

## 2019-03-01 NOTE — Progress Notes (Signed)
Patient ID: Megan [name], female   DOB: 01/24/1956, 63 y.o.   MRN: [id number]    HPI  Megan [name] is a 63 y.o.-year-old female, initially referred by her OB/GYN doctor, Dr. Ronita Hipps, returning for follow-up for osteoporosis and hypothyroidism.  Last visit almost 1 year ago.  She continues to see multiple specialists.  Pt was dx with osteoporosis in 2006, but she had lower BMD even before menopause in 2004.  Before last visit, she saw an osteoporosis specialist and had another bone density scan.  The results were similar to the results from 07/2017.  She continues to follow with him.  Reviewed the available DXA scan reports Date L1-L4 T score FN T score 33% distal Radius  11/03/2018 [facility] [place]) -3.5 (-2.8%) RFN: -2.3 LFN: -2.1  n/a  11/10/2017 [facility] [place]) -2.3 RFN: n/a LFN: n/a n/a  08/13/2017 -3.3 (-4.4%*) RFN: -2.0 LFN: -1.8 n/a  08/10/2015 -3.0 RFN: -2.0 LFN: -2.2 n/a   Per records brought by patient: L1-L4:  06/2013: -3.5  10/2007: -3.1  07/2005: -2.5  05/2003: -2.2  RFN:  06/2013: -2.2  10/2007: -2.0  07/2005: -2.1  05/2003: -1.8  LFN:  06/2013: -2.2  10/2007: -2.1  07/2005: -2.2  05/2003: -1.9  She had one fracture: - 07/2016: Right rib  No history of falls or fractures.  Previous osteoporotic treatments reviewed: - Fosamax and Actonel - 2001-2004 (no help) - Estradiol 0.0125 mg + Progesterone - 2014-2016 (improvement in spine BMD)  No history of vitamin D deficiency.    Reviewed previous levels: 01/27/2019: Vitamin D 50.7-on 5000 units vitamin D daily 03/29/2018: Vitamin D 82.5 09/2017: Vitamin D 72 06/17/2016: Vitamin D 35 No results found for: VD25OH   She continues on 2000 units vitamin D 5 days a week >> now increased to 5000 units a day.  Also taking vitamin K2 0.8.  She continues to exercise consistently.  She went to [facility] >> got hurt - also had a HA that lasted 3 weeks.   No history of repeated steroid  courses.  In 2018, she ruled out for multiple myeloma by protein electrophoresis.  She has polyclonal gammopathy and follows with hematology.  She is also investigated by nephrology for proteinuria.  Menopause was at 63 years old.  Pt does have a FH of osteoporosis: M - had spinal fx's, father - Lupron.  She is seeing [facility] >> saw him 3x in the past >> now sees another functional medicine dr. She was told she had mold in her house, low WBC, tested positive for heavy metals, iron is low (she had a recent low ferritin level, also - 01/2019). She feels much better than when she started seeing him.  No hyper or hypocalcemia or hyperparathyroidism.  No history of kidney stones. 02/03/2019: calcium 9.7 03/29/2018: PTH 48 Lab Results  Component Value Date   CALCIUM 9.2 08/22/2018   CALCIUM 9.4 03/20/2018   CALCIUM 9.2 01/19/2018   CALCIUM 9.2 12/30/2017   CALCIUM 9.5 11/04/2017   CALCIUM 9.8 08/08/2016   CALCIUM 9.3 03/05/2016   CALCIUM 9.5 03/03/2016   CALCIUM 9.0 03/25/2015   CALCIUM 9.8 12/26/2013   No history of CKD.  Latest BUN/creatinine available for review: 02/12/2019: 12/0.70 Lab Results  Component Value Date   BUN 6 (L) 08/22/2018   CREATININE 0.65 08/22/2018   Hypothyroidism.  She was initially on Nature-Throid, then had to switch to Armour due to [employer]. She noticed hair loss after the switch, but no other new symptoms.  At last visit, she was taken off the medication by 1 of her other providers but she felt terrible and restarted.  Reviewed her TFTs: 01/27/2019: TSH: 2.21 Lab Results  Component Value Date   TSH 2.78 06/30/2018  03/29/2018: TSH 3.17, free T4 0.75, free T3 3.0 02/10/2018: TSH 1.89, free T4 0.9, free T3 2.2 11/04/2017: TSH 2.74, free T4 0.59, free T3 5.2 08/14/2017: TSH 1.780, fT4 1.08 - pt was on Biotin 10,000 mcg when labs drawn; Selenium and Urinary iodine normal 07/19/2008: TSH 6.76  Pt is on Armour 30 daily (equivalent to 50 mcg  levothyroxine daily), taken: - in am - fasting - no coffee - at least 30 min from b'fast - no Ca, Fe, MVI, PPIs - On biotin 5000 mcg  She is on multiple supplements, to include alpha lipoic acid, CoQ10, 5 HTP, vitamin B12, probiotics, magnesium, etc. we stopped her vitamin A.  She stopped the supplement since last visit and also her multivitamins due to elevated B6 vitamin level.  At last check, on 02/10/2018, her vitamin B6 was 10.9 (2.0-32.8), normal.  She also went to the emergency room since last visit for chest pain after she started a natural supplement (nitric balance-ATP), which she stopped since.  Elevated estradiol:  Reviewed history: She had previously undetectable estradiol levels in 07/25/2013, 08/21/2014, 05/05/2016, 06/03/2016, however, in 04/25/2017, her level was 44.3 pg/mL.  At that time, she was giving estriol to her dog without gloves.  She started to use gloves and her level became undetectable again.  She then started to reuse the same gloves and in 03/23/2018, the level was 197.8 pg/mL.  She started to use gloves only once for administration and her level decreased to 101.8 on 03/29/2018.  She was very worried about this despite reassurance that the pattern of estrogen increase was consistent with interference with the assay rather than a tumoral source. She had imaging tests that initially showed an ovarian cyst which resolved.  The elevated estrogen was deemed to be due to interference with the assay from her high-dose biotin.  ROS: Constitutional: no weight gain/no weight loss, no fatigue, no subjective hyperthermia, no subjective hypothermia Eyes: no blurry vision, no xerophthalmia ENT: no sore throat, no nodules palpated in neck, no dysphagia, no odynophagia, no hoarseness Cardiovascular: no CP/no SOB/no palpitations/no leg swelling Respiratory: no cough/no SOB/no wheezing Gastrointestinal: no N/no V/no D/no C/no acid reflux Musculoskeletal: no muscle aches/no joint  aches Skin: no rashes, no hair loss Neurological: no tremors/no numbness/no tingling/no dizziness  I reviewed pt's medications, allergies, PMH, social hx, family hx, and changes were documented in the history of present illness. Otherwise, unchanged from my initial visit note.  Past Medical History:  Diagnosis Date  . Allergy   . Aneurysm of cardiac wall, congenital    a.  septal aneurysm extending into the RVOT (by cardiac MRI in 08/2013)  . Anxiety   . Arthritis   . Asthma   . Depression   . Heart murmur   . Hypothyroidism   . Lyme disease   . MVP (mitral valve prolapse)   . Orthostatic hypotension   . Osteoporosis   . Peripheral neuropathy 06/09/2012  . Plantar fasciitis   . Pneumothorax    Past Surgical History:  Procedure Laterality Date  . COLONOSCOPY WITH PROPOFOL N/A 02/22/2013   Procedure: COLONOSCOPY WITH PROPOFOL;  Surgeon: Garlan Fair, MD;  Location: [facility];  Service: Endoscopy;  Laterality: N/A;  . MOUTH SURGERY  01/2012   bone graft  in mouth  . NASAL SINUS SURGERY Right 06/12/2014   Procedure: RIGHT ENDOSCOPIC MAXILLARY ANTROSTOMY;  Surgeon: Ascencion Dike, MD;  Location: [facility];  Service: ENT;  Laterality: Right;  . ROOT CANAL  02/27/2012  . TONSILLECTOMY  1962   Social History   Socioeconomic History  . Marital status: Divorced    Spouse name: Not on file  . Number of children: 0  . Years of education: Not on file  . Highest education level: Not on file  Occupational History    Comment: Career and life coach  Social Needs  . Financial resource strain: Not on file  . Food insecurity    Worry: Not on file    Inability: Not on file  . Transportation needs    Medical: Not on file    Non-medical: Not on file  Tobacco Use  . Smoking status: Never Smoker  . Smokeless tobacco: Never Used  Substance and Sexual Activity  . Alcohol use: No    Alcohol/week: 0.0 standard drinks  . Drug use: No  . Sexual activity: Not on file   Lifestyle  . Physical activity    Days per week: Not on file    Minutes per session: Not on file  . Stress: Not on file  Relationships  . Social Herbalist on phone: Not on file    Gets together: Not on file    Attends religious service: Not on file    Active member of club or organization: Not on file    Attends meetings of clubs or organizations: Not on file    Relationship status: Not on file  . Intimate partner violence    Fear of current or ex partner: Not on file    Emotionally abused: Not on file    Physically abused: Not on file    Forced sexual activity: Not on file  Other Topics Concern  . Not on file  Social History Narrative   Patient is single and lives alone.   Patient is self-employed, career and life coaching.   Patient drinks two to four cups of caffeine daily.   Current Outpatient Medications on File Prior to Visit  Medication Sig Dispense Refill  . 5-Hydroxytryptophan (5-HTP) 50 MG CAPS Take 50 mg by mouth daily.    . AMINO ACIDS COMPLEX PO Take 1 Scoop by mouth daily.     Francia Greaves THYROID 30 MG tablet Take 30 mg by mouth daily.  6  . Ascorbic Acid (VITAMIN C PO) Take 4,000 Units by mouth daily.    . Cholecalciferol (VITAMIN D3) 10000 units capsule Take 20,000 Units by mouth daily.     . Cyanocobalamin (VITAMIN B-12 ER PO) Take 2,500 mcg by mouth daily.     . Digestive Enzymes (DIGESTIVE ENZYME PO) Take 1 tablet by mouth daily.    [name] MAGNESIUM CITRATE PO Take 800 mg by mouth daily.     . Menaquinone-7 (VITAMIN K2 PO) Take 1 tablet by mouth daily.    . Omega-3 Fatty Acids (FISH OIL) 1000 MG CAPS Take 1,000 mg by mouth daily.    . Probiotic Product (PROBIOTIC DAILY PO) Take 1 capsule by mouth daily.      Current Facility-Administered Medications on File Prior to Visit  Medication Dose Route Frequency Provider Last Rate Last Dose  . 0.9 %  sodium chloride infusion  500 mL Intravenous Once Pyrtle, [name], MD       No Known Allergies Family  History   Problem Relation Age of Onset  . Hyperlipidemia Father   . Heart failure Father   . Prostate cancer Father   . Kidney failure Father   . Hypertension Father   . Angina Mother   . Hypertension Mother   . Lung cancer Mother   . Colon cancer Mother   . Lung cancer Paternal Grandfather   . Breast cancer Paternal Grandmother   . Diabetes Paternal Grandmother   . Esophageal cancer Neg Hx   . Liver cancer Neg Hx   . Pancreatic cancer Neg Hx   . Rectal cancer Neg Hx   . Stomach cancer Neg Hx     PE: BP 110/70   Pulse 60   Ht 5' 9.5" (1.765 m)   Wt 134 lb (60.8 kg)   SpO2 97%   BMI 19.50 kg/m  Wt Readings from Last 3 Encounters:  03/01/19 134 lb (60.8 kg)  02/23/19 132 lb 14.4 oz (60.3 kg)  10/27/18 130 lb (59 kg)   Constitutional: normal weight, in NAD Eyes: PERRLA, EOMI, no exophthalmos ENT: moist mucous membranes, no thyromegaly, no cervical lymphadenopathy Cardiovascular: RRR, No MRG Respiratory: CTA B Gastrointestinal: abdomen soft, NT, ND, BS+ Musculoskeletal: no deformities, strength intact in all 4 Skin: moist, warm, no rashes Neurological: no tremor with outstretched hands, DTR normal in all 4  Assessment: 1. Osteoporosis  2.  Hypothyroidism  3.  Elevated estrogen  Other treating physicians:  Dr. Ronita Hipps  Dr. August Luz [facility] [place] [address]  Plan: 1. Osteoporosis -Likely age-related/postmenopausal + she also has family history of osteoporosis.  She also has a history of drinking a lot of coffee before, now stopped -she feels that this may have contributed. -No fractures since last visit -She had 2 other DEXA scans in 2019 and 2020 which appears stable per review of the records brought by patient -However, they are still in the osteoporotic range and she is still at a high risk for fracture -At this visit, we discussed again about options for treatment.  She refuses antiresorptives and bone anabolic agents and inquires  about other ways to improve her bone density.  I do not feel that estrogen is a good option for her more than 10 years after menopause and I explained that this will increase her risk of thrombosis and also breast cancer.  She is on many supplements for bone maintenance and I advised her to pay attention to the amount of vitamin D that she is getting from all of them. -Reviewed her vit D supplement -currently on a higher dose, of 5000 units daily.  She is also taking vitamin K2, which enhances the effect of the vitamin D supplement.  On this dose, her vitamin D level was normal recently. - In the past, we stopped vitamin A since an excess of this can lead to osteoporosis. -She went to the [facility] but pushed herself too hard and developed muscle aches and headaches.  At this visit, she tells me that she is considering going back -We also discussed about improving her exercise: Exercise after meals is the one that stimulates the bones as opposed to before meals, walking downhill is much more beneficial than walking at same level or going uphill, using weights (backpack, hand weights) when walking, etc. -We also again discussed about improving diet (continue low acid foods).  She has a history of low ferritin and she was wondering whether increasing red meat would help.  I  advised her that this is not beneficial to her bowels or to her general health but it is more deleterious. -She continues to see an osteoporosis specialist who orders her bone densities -She will return in 1 year  2. Hypothyroidism -She had no clear diagnosis of hypothyroidism when her treatment with thyroid hormones were started.  She was started on this based on symptoms.  Since her TFTs remained normal afterwards, we continued with her low-dose supplementation. - latest thyroid labs reviewed with pt >> normal 06/2018 - she continues on Armour 30 mg daily - pt feels good on this dose. - we discussed about taking the thyroid  hormone every day, with water, >30 minutes before breakfast, separated by >4 hours from acid reflux medications, calcium, iron, multivitamins. Pt. is taking it correctly. - will check thyroid tests today: TSH, free T3 and fT4 - If labs are abnormal, she will need to return for repeat TFTs in 1.5 months  3.  Elevated estrogen -It was determined that this was an erroneous level due to assay interference from biotin.  She is asking me about whether to continue biotin or not.  I advised her to not take such a high dose but take it most 1000 mcg biotin a day, and to stop this before having labs. -No need to further investigate for this.  - time spent with the patient: 40 minutes, of which >50% was spent in obtaining information about her symptoms, reviewing her previous labs, evaluations, and treatments, counseling her about her conditions (please see the discussed topics above), and developing a plan to further investigate and treat them; she had a list of questions which I addressed.  Philemon Kingdom, MD PhD [facility]

## 2019-03-01 NOTE — Patient Instructions (Signed)
Please continue the current regimen.  Please come back for a follow-up appointment in 1 year.

## 2019-03-02 DIAGNOSIS — H539 Unspecified visual disturbance: Secondary | ICD-10-CM | POA: Diagnosis not present

## 2019-03-02 DIAGNOSIS — E611 Iron deficiency: Secondary | ICD-10-CM | POA: Diagnosis not present

## 2019-03-02 DIAGNOSIS — M810 Age-related osteoporosis without current pathological fracture: Secondary | ICD-10-CM | POA: Diagnosis not present

## 2019-03-03 ENCOUNTER — Inpatient Hospital Stay: Payer: BC Managed Care – PPO | Admitting: Hematology

## 2019-03-03 ENCOUNTER — Inpatient Hospital Stay: Payer: BC Managed Care – PPO

## 2019-03-03 ENCOUNTER — Other Ambulatory Visit: Payer: Self-pay

## 2019-03-03 ENCOUNTER — Telehealth: Payer: Self-pay | Admitting: Hematology

## 2019-03-03 VITALS — BP 108/55 | HR 64 | Temp 98.7°F | Resp 18 | Ht 69.5 in | Wt 134.3 lb

## 2019-03-03 DIAGNOSIS — I951 Orthostatic hypotension: Secondary | ICD-10-CM | POA: Diagnosis not present

## 2019-03-03 DIAGNOSIS — G629 Polyneuropathy, unspecified: Secondary | ICD-10-CM | POA: Diagnosis not present

## 2019-03-03 DIAGNOSIS — D510 Vitamin B12 deficiency anemia due to intrinsic factor deficiency: Secondary | ICD-10-CM

## 2019-03-03 DIAGNOSIS — Z803 Family history of malignant neoplasm of breast: Secondary | ICD-10-CM | POA: Diagnosis not present

## 2019-03-03 DIAGNOSIS — A6920 Lyme disease, unspecified: Secondary | ICD-10-CM | POA: Diagnosis not present

## 2019-03-03 DIAGNOSIS — F4322 Adjustment disorder with anxiety: Secondary | ICD-10-CM | POA: Diagnosis not present

## 2019-03-03 DIAGNOSIS — F329 Major depressive disorder, single episode, unspecified: Secondary | ICD-10-CM | POA: Diagnosis not present

## 2019-03-03 DIAGNOSIS — D72819 Decreased white blood cell count, unspecified: Secondary | ICD-10-CM | POA: Diagnosis not present

## 2019-03-03 DIAGNOSIS — M810 Age-related osteoporosis without current pathological fracture: Secondary | ICD-10-CM | POA: Diagnosis not present

## 2019-03-03 DIAGNOSIS — I253 Aneurysm of heart: Secondary | ICD-10-CM | POA: Diagnosis not present

## 2019-03-03 DIAGNOSIS — Z800 Family history of malignant neoplasm of digestive organs: Secondary | ICD-10-CM

## 2019-03-03 DIAGNOSIS — I341 Nonrheumatic mitral (valve) prolapse: Secondary | ICD-10-CM | POA: Diagnosis not present

## 2019-03-03 DIAGNOSIS — N83201 Unspecified ovarian cyst, right side: Secondary | ICD-10-CM | POA: Diagnosis not present

## 2019-03-03 DIAGNOSIS — E039 Hypothyroidism, unspecified: Secondary | ICD-10-CM | POA: Diagnosis not present

## 2019-03-03 LAB — CMP (CANCER CENTER ONLY)
ALT: 31 U/L (ref 0–44)
AST: 50 U/L — ABNORMAL HIGH (ref 15–41)
Albumin: 4.6 g/dL (ref 3.5–5.0)
Alkaline Phosphatase: 86 U/L (ref 38–126)
Anion gap: 11 (ref 5–15)
BUN: 9 mg/dL (ref 8–23)
CO2: 28 mmol/L (ref 22–32)
Calcium: 9.7 mg/dL (ref 8.9–10.3)
Chloride: 104 mmol/L (ref 98–111)
Creatinine: 0.79 mg/dL (ref 0.44–1.00)
GFR, Est AFR Am: >60 mL/min (ref >60)
GFR, Estimated: >60 mL/min (ref >60)
Glucose, Bld: 71 mg/dL (ref 70–99)
Potassium: 3.7 mmol/L (ref 3.5–5.1)
Sodium: 143 mmol/L (ref 135–145)
Total Bilirubin: 1 mg/dL (ref 0.3–1.2)
Total Protein: 7.5 g/dL (ref 6.5–8.1)

## 2019-03-03 LAB — CBC WITH DIFFERENTIAL/PLATELET
Abs Immature Granulocytes: 0 10*3/uL (ref 0.00–0.07)
Basophils Absolute: 0 10*3/uL (ref 0.0–0.1)
Basophils Relative: 1 %
Eosinophils Absolute: 0 10*3/uL (ref 0.0–0.5)
Eosinophils Relative: 1 %
HCT: 43.8 % (ref 36.0–46.0)
Hemoglobin: 14.4 g/dL (ref 12.0–15.0)
Immature Granulocytes: 0 %
Lymphocytes Relative: 37 %
Lymphs Abs: 1.3 10*3/uL (ref 0.7–4.0)
MCH: 29.9 pg (ref 26.0–34.0)
MCHC: 32.9 g/dL (ref 30.0–36.0)
MCV: 91.1 fL (ref 80.0–100.0)
Monocytes Absolute: 0.3 10*3/uL (ref 0.1–1.0)
Monocytes Relative: 8 %
Neutro Abs: 1.9 10*3/uL (ref 1.7–7.7)
Neutrophils Relative %: 53 %
Platelets: 205 10*3/uL (ref 150–400)
RBC: 4.81 MIL/uL (ref 3.87–5.11)
RDW: 12.5 % (ref 11.5–15.5)
WBC: 3.5 10*3/uL — ABNORMAL LOW (ref 4.0–10.5)
nRBC: 0 % (ref 0.0–0.2)

## 2019-03-03 LAB — VITAMIN B12: Vitamin B-12: 1313 pg/mL — ABNORMAL HIGH (ref 180–914)

## 2019-03-03 NOTE — Progress Notes (Signed)
HEMATOLOGY/ONCOLOGY CONSULTATION NOTE  Date of Service: 03/03/2019  Patient Care Team: Brunetta Genera, MD as PCP - General (Hematology) Annia Belt, MD as Consulting Physician (Oncology) Alda Berthold, DO as Consulting Physician (Neurology) Arnetha Gula, MD as Consulting Physician (Family Medicine) Gerda Diss, DO as Consulting Physician (Sports Medicine) Brunetta Genera, MD as Consulting Physician (Hematology)  CHIEF COMPLAINTS/PURPOSE OF CONSULTATION:  Chronic Idiopathic Neutropenia  HISTORY OF PRESENTING ILLNESS:   Megan [name] is a wonderful 63 y.o. female who has been referred to [place] by Dr. Murriel Hopper for evaluation and management of Chronic idiopathic neutropenia. The pt reports that she is doing well overall.   The pt reports a history of mold exposure, chronic mild neutropenia, and neuropathy. She denies frequent or abnormal infections.   The pt notes that she was first diagnosed with neuropathy in 2010, and has seen five neurologists. She endorses weakness and tingling in feet into mid-calf which has improved over the past 10 years. She has current weakness and concern for balance which limits her activity. She adds that the bottom of her feet occasionally become dark purple. She has had doppler studies and notes these ruled out vascular problems. She has seen holistic health practitioners in [place] in the past. She had a four out of five positives on western blot which revealed concerns for Lyme disease but apparently was not conclusive. She previously lived in [place]. She denies ever having a bulls eye rash.  The pt notes some difficulty with writing, which has not been progressive.   The pt has recently been worked up for elevated estrogen levels and a previous cyst, and was previously taking Biotin. She stopped taking Boitin and has seen normalized estrogen levels. She notes that repeat MRI revealed the absence of the  previously seen cyst  The pt also reports a history of polyclonal gammopathy.   She notes that her Ferritin was seen to be at 26 about 1.5 years ago. She has been a vegan and then began eating some meat protein. She also took PO iron replacement which was constipating. She also has taken Blood builder. Her Ferritin has recently decreased to a 10. Pt denies blood in the stools nor black stools. She had a colonoscopy about 1.5 years ago and notes this was unremarkable. She denies unexpected weight loss. She denies using acid suppressants. She endorses having pernicious anemia. Pt takes 5013mcg Vitamin B12 daily. She is also taking 10k units Vitamin D3 five days a week.   The pt notes that she has osteoporosis, with L1-L4 with score of -3.5. She has been disinclined to pursue Prolia injections. She has been consuming almond milk and avoids dairy products. She took Fosamax three years ago.  Most recent lab results (08/22/18) of CBC is as follows: all values are WNL except for WBC at 3.5k.  On review of systems, pt reports neuropathy with weakness in feet, stable energy levels, constipation, and denies blood in the stools, black stools, abdominal pains, and any other symptoms.   On PMHx the pt reports neuropathy, pernicious anemia, osteoporosis.  MEDICAL HISTORY:  Past Medical History:  Diagnosis Date  . Allergy   . Aneurysm of cardiac wall, congenital    a.  septal aneurysm extending into the RVOT (by cardiac MRI in 08/2013)  . Anxiety   . Arthritis   . Asthma   . Depression   . Heart murmur   . Hypothyroidism   . Lyme disease   .  MVP (mitral valve prolapse)   . Orthostatic hypotension   . Osteoporosis   . Peripheral neuropathy 06/09/2012  . Plantar fasciitis   . Pneumothorax     SURGICAL HISTORY: Past Surgical History:  Procedure Laterality Date  . COLONOSCOPY WITH PROPOFOL N/A 02/22/2013   Procedure: COLONOSCOPY WITH PROPOFOL;  Surgeon: Garlan Fair, MD;  Location: [facility];  Service: Endoscopy;  Laterality: N/A;  . MOUTH SURGERY  01/2012   bone graft in mouth  . NASAL SINUS SURGERY Right 06/12/2014   Procedure: RIGHT ENDOSCOPIC MAXILLARY ANTROSTOMY;  Surgeon: Ascencion Dike, MD;  Location: [facility];  Service: ENT;  Laterality: Right;  . ROOT CANAL  02/27/2012  . TONSILLECTOMY  1962    SOCIAL HISTORY: Social History   Socioeconomic History  . Marital status: Divorced    Spouse name: Not on file  . Number of children: 0  . Years of education: Not on file  . Highest education level: Not on file  Occupational History    Comment: Career and life coach  Social Needs  . Financial resource strain: Not on file  . Food insecurity    Worry: Not on file    Inability: Not on file  . Transportation needs    Medical: Not on file    Non-medical: Not on file  Tobacco Use  . Smoking status: Never Smoker  . Smokeless tobacco: Never Used  Substance and Sexual Activity  . Alcohol use: No    Alcohol/week: 0.0 standard drinks  . Drug use: No  . Sexual activity: Not on file  Lifestyle  . Physical activity    Days per week: Not on file    Minutes per session: Not on file  . Stress: Not on file  Relationships  . Social Herbalist on phone: Not on file    Gets together: Not on file    Attends religious service: Not on file    Active member of club or organization: Not on file    Attends meetings of clubs or organizations: Not on file    Relationship status: Not on file  . Intimate partner violence    Fear of current or ex partner: Not on file    Emotionally abused: Not on file    Physically abused: Not on file    Forced sexual activity: Not on file  Other Topics Concern  . Not on file  Social History Narrative   Patient is single and lives alone.   Patient is self-employed, career and life coaching.   Patient drinks two to four cups of caffeine daily.    FAMILY HISTORY: Family History  Problem Relation Age of Onset   . Hyperlipidemia Father   . Heart failure Father   . Prostate cancer Father   . Kidney failure Father   . Hypertension Father   . Angina Mother   . Hypertension Mother   . Lung cancer Mother   . Colon cancer Mother   . Lung cancer Paternal Grandfather   . Breast cancer Paternal Grandmother   . Diabetes Paternal Grandmother   . Esophageal cancer Neg Hx   . Liver cancer Neg Hx   . Pancreatic cancer Neg Hx   . Rectal cancer Neg Hx   . Stomach cancer Neg Hx     ALLERGIES:  has No Known Allergies.  MEDICATIONS:  Current Outpatient Medications  Medication Sig Dispense Refill  . 5-Hydroxytryptophan (5-HTP) 50 MG CAPS Take 50 mg by  mouth daily.    . AMINO ACIDS COMPLEX PO Take 1 Scoop by mouth daily.     Francia Greaves THYROID 30 MG tablet Take 30 mg by mouth daily.  6  . Ascorbic Acid (VITAMIN C PO) Take 4,000 Units by mouth daily.    . Cholecalciferol (VITAMIN D3) 10000 units capsule Take 20,000 Units by mouth daily.     . Cyanocobalamin (VITAMIN B-12 ER PO) Take 2,500 mcg by mouth daily.     . Digestive Enzymes (DIGESTIVE ENZYME PO) Take 1 tablet by mouth daily.    [name] MAGNESIUM CITRATE PO Take 800 mg by mouth daily.     . Menaquinone-7 (VITAMIN K2 PO) Take 1 tablet by mouth daily.    . Omega-3 Fatty Acids (FISH OIL) 1000 MG CAPS Take 1,000 mg by mouth daily.    . Probiotic Product (PROBIOTIC DAILY PO) Take 1 capsule by mouth daily.      Current Facility-Administered Medications  Medication Dose Route Frequency Provider Last Rate Last Dose  . 0.9 %  sodium chloride infusion  500 mL Intravenous Once Pyrtle, [name], MD        REVIEW OF SYSTEMS:    10 Point review of Systems was done is negative except as noted above.  PHYSICAL EXAMINATION:  . Vitals:   03/03/19 1331  BP: (!) 108/55  Pulse: 64  Resp: 18  Temp: 98.7 F (37.1 C)  SpO2: 100%   Filed Weights   03/03/19 1331  Weight: 134 lb 4.8 oz (60.9 kg)   .Body mass index is 19.55 kg/m.  GENERAL:alert, in no acute  distress and comfortable SKIN: no acute rashes, no significant lesions EYES: conjunctiva are pink and non-injected, sclera anicteric OROPHARYNX: MMM, no exudates, no oropharyngeal erythema or ulceration NECK: supple, no JVD LYMPH:  no palpable lymphadenopathy in the cervical, axillary or inguinal regions LUNGS: clear to auscultation b/l with normal respiratory effort HEART: regular rate & rhythm ABDOMEN:  normoactive bowel sounds , non tender, not distended. No palpable hepatosplenomegaly. Extremity: no pedal edema PSYCH: alert & oriented x 3 with fluent speech NEURO: no focal motor/sensory deficits  LABORATORY DATA:  I have reviewed the data as listed  . CBC Latest Ref Rng & Units 08/22/2018 03/20/2018 01/05/2018  WBC 4.0 - 10.5 K/uL 3.5(L) 3.8(L) 3.4(L)  Hemoglobin 12.0 - 15.0 g/dL 12.2 12.7 13.9  Hematocrit 36.0 - 46.0 % 38.4 39.8 42.9  Platelets 150 - 400 K/uL 223 207 230    . CMP Latest Ref Rng & Units 08/22/2018 03/20/2018 01/19/2018  Glucose 70 - 99 mg/dL 82 120(H) 86  BUN 8 - 23 mg/dL 6(L) 11 11  Creatinine 0.44 - 1.00 mg/dL 0.65 0.69 0.71  Sodium 135 - 145 mmol/L 142 141 143  Potassium 3.5 - 5.1 mmol/L 3.4(L) 4.3 4.1  Chloride 98 - 111 mmol/L 105 102 106  CO2 22 - 32 mmol/L 26 27 25   Calcium 8.9 - 10.3 mg/dL 9.2 9.4 9.2  Total Protein g/dL - - CANCELED  Total Bilirubin 0.3 - 1.2 mg/dL - - -  Alkaline Phos 38 - 126 U/L - - -  AST 15 - 41 U/L - - -  ALT 14 - 54 U/L - - -     RADIOGRAPHIC STUDIES: I have personally reviewed the radiological images as listed and agreed with the findings in the report. Mr Pelvis Wo Contrast  Result Date: 02/21/2019 CLINICAL DATA:  Follow-up right ovarian cyst. Postmenopausal female. EXAM: MRI PELVIS WITHOUT CONTRAST TECHNIQUE: Multiplanar multisequence  MR imaging of the pelvis was performed. No intravenous contrast was administered. COMPARISON:  08/23/2018 FINDINGS: Lower Urinary Tract: Mild wall thickening and trabeculation of the  urinary bladder noted. Bowel: Unremarkable appearance of rectum and other pelvic bowel loops. Vascular/Lymphatic: Unremarkable. No pathologically enlarged pelvic lymph nodes identified. Reproductive: -- Uterus: Measures 4.8 x 2.2 x 4.5 cm (volume = 25 cm^3). No fibroids or other masses identified. No abnormal endometrial thickening. Cervix and vagina are unremarkable. -- Right ovary: Previously seen small cyst in right adnexa has resolved since previous exam. Ovary not directly visualized, however no adnexal mass identified. -- Left ovary: Not directly visualized, however no adnexal mass identified. Other: No peritoneal thickening or abnormal free fluid. Musculoskeletal:  Unremarkable. IMPRESSION: Interval resolution of right adnexal cyst since prior exam. No pelvic mass or other significant abnormality identified. Electronically Signed   By: Marlaine Hind M.D.   On: 02/21/2019 11:49    ASSESSMENT & PLAN:  63 y.o. female with  1. Chronic idiopathic leucopenia. WBC count today low normal @ 3.5k ANC 1900 ( no neutropenia) 2. Pernicious anemia  PLAN: -Discussed patient's most recent labs from 08/22/18, WBC stable at 3.5k, HGB normal and PLT normal. -Discussed the 12/30/17 Antiparietal cell antibodies which were high at 105.9 and discussed that this can correspond to several vitamin deficiencies and iron deficiency as well -Discussed that she has demonstrated tendency to autoimmunity which can be associated with thyroid dysfunction (which the pt has), and also autoimmune leukopenia. -Pt's leukopenia has been mild over the last several years and non-progressive, and has actually improved recently. -She has not had a history or concern for frequent or abnormal infections -Pt has had borderline low IgG levels, as seen on 2015 SPEP. -Pt reports a recent Ferritin of 10, and will check labs again today -Recommend beginning PO 150mg  Iron Polysaccharide daily and up to BID if tolerated well for maintenance  replacement.  -Recommend taking PO iron replacement with orange juice -Pt prefers to try PO Iron replacement for two months before considering IV replacement -Recommend SL Vitamin B12 replacement -Recommend continuing Vitamin D replacement, could consider 50kunit Ergocalciferol once a week and goal of 60-90. -Recommend optimizing nutritional calcium forms up to 1500mg  -Recommend an empiric Vitamin B complex -Advised against bending and twisting exercises given her osteoporosis and discussed pros vs cons of bone strengthening medications such as Prolia injections -Will defer further GI evaluation to PCP with Fecal occult blood testing.  -Will order labs today and will check for intrinsic factor antibodies -Will see the pt back in 2 months   Labs today RTC with Dr Irene Limbo with labs in 2 months   All of the patients questions were answered with apparent satisfaction. The patient knows to call the clinic with any problems, questions or concerns.  The total time spent in the appt was 45 minutes and more than 50% was on counseling and direct patient cares.    Sullivan Lone MD MS AAHIVMS [facility] Hematology/Oncology Physician [facility]  (Office):       [phone] (Work cell):  [phone] (Fax):           [phone]  03/03/2019 2:30 PM  I, Baldwin [place], am acting as a scribe for Dr. Sullivan Lone.   .I have reviewed the above documentation for accuracy and completeness, and I agree with the above. Brunetta Genera MD

## 2019-03-03 NOTE — Telephone Encounter (Signed)
Scheduled appt per 7/9 los.  Printed calendar and avs.

## 2019-03-04 ENCOUNTER — Ambulatory Visit: Payer: BLUE CROSS/BLUE SHIELD | Admitting: Gynecologic Oncology

## 2019-03-04 LAB — IRON AND TIBC
Iron: 164 ug/dL — ABNORMAL HIGH (ref 41–142)
Saturation Ratios: 37 % (ref 21–57)
TIBC: 439 ug/dL (ref 236–444)
UIBC: 275 ug/dL (ref 120–384)

## 2019-03-04 LAB — FERRITIN: Ferritin: 34 ng/mL (ref 11–307)

## 2019-03-04 LAB — INTRINSIC FACTOR ANTIBODIES: Intrinsic Factor: 1.9 AU/mL — ABNORMAL HIGH (ref 0.0–1.1)

## 2019-03-04 LAB — ANTI-PARIETAL ANTIBODY: Parietal Cell Antibody-IgG: 98 Units — ABNORMAL HIGH (ref 0.0–20.0)

## 2019-03-08 DIAGNOSIS — Z803 Family history of malignant neoplasm of breast: Secondary | ICD-10-CM | POA: Diagnosis not present

## 2019-03-08 DIAGNOSIS — R922 Inconclusive mammogram: Secondary | ICD-10-CM | POA: Diagnosis not present

## 2019-03-08 DIAGNOSIS — N6002 Solitary cyst of left breast: Secondary | ICD-10-CM | POA: Diagnosis not present

## 2019-03-09 ENCOUNTER — Encounter: Payer: Self-pay | Admitting: Hematology

## 2019-03-10 DIAGNOSIS — F4322 Adjustment disorder with anxiety: Secondary | ICD-10-CM | POA: Diagnosis not present

## 2019-03-11 ENCOUNTER — Encounter: Payer: Self-pay | Admitting: Hematology

## 2019-03-16 ENCOUNTER — Encounter: Payer: Self-pay | Admitting: *Deleted

## 2019-03-17 DIAGNOSIS — F4322 Adjustment disorder with anxiety: Secondary | ICD-10-CM | POA: Diagnosis not present

## 2019-03-18 DIAGNOSIS — R51 Headache: Secondary | ICD-10-CM | POA: Diagnosis not present

## 2019-03-23 DIAGNOSIS — M792 Neuralgia and neuritis, unspecified: Secondary | ICD-10-CM | POA: Diagnosis not present

## 2019-03-23 DIAGNOSIS — D519 Vitamin B12 deficiency anemia, unspecified: Secondary | ICD-10-CM | POA: Diagnosis not present

## 2019-03-24 DIAGNOSIS — F4322 Adjustment disorder with anxiety: Secondary | ICD-10-CM | POA: Diagnosis not present

## 2019-03-24 DIAGNOSIS — Z77120 Contact with and (suspected) exposure to mold (toxic): Secondary | ICD-10-CM | POA: Diagnosis not present

## 2019-03-24 NOTE — Progress Notes (Signed)
Virtual Visit via Video Note   This visit type was conducted due to national recommendations for restrictions regarding the COVID-19 Pandemic (e.g. social distancing) in an effort to limit this patient's exposure and mitigate transmission in our community.  Due to her co-morbid illnesses, this patient is at least at moderate risk for complications without adequate follow up.  This format is felt to be most appropriate for this patient at this time.  All issues noted in this document were discussed and addressed.  A limited physical exam was performed with this format.  Please refer to the patient's chart for her consent to telehealth for [facility]   Date:  03/28/2019   ID:  Megan [name], DOB 26-Sep-1955, MRN [id number]  Patient Location:Home Provider Location: Home  PCP:  Patient, No Pcp Per  Cardiologist:  Dr Stanford Breed  Evaluation Performed:  Follow-Up Visit  Chief Complaint:  FU CP and dyspnea  History of Present Illness:    FUdyspnea. MRA January 2015 showed aneurysmal dilatation of the membranous ventricular septum extending into the right ventricular outflow tract. There was no sinus of Valsalva aneurysm. Ejection fraction 57%. Carotid Dopplers August 2016 normal. Echocardiogram May 2018 showed normal LV systolic function and mild mitral regurgitation. 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 Lower ext arterial Dopplers May 2018 normal.Stress echocardiogramFeb 2019was normal. CTA June 2019 showed aneurysm of the membranous ventricular septum that bulges into the right ventricular outflow tract.  There is no connection between the left ventricle and right ventricle.  There is no sinus of Valsalva aneurysm.  Calcium score 0.  No coronary disease.  Since last seen  patient occasionally feels a vague discomfort under her left ribs.  She does not have exertional chest pain, dyspnea or syncope.  Some dizziness with standing.  The patient does not  have symptoms concerning for COVID-19 infection (fever, chills, cough, or new shortness of breath).    Past Medical History:  Diagnosis Date  . Allergy   . Aneurysm of cardiac wall, congenital    a.  septal aneurysm extending into the RVOT (by cardiac MRI in 08/2013)  . Anxiety   . Arthritis   . Asthma   . Depression   . Heart murmur   . Hypothyroidism   . Lyme disease   . MVP (mitral valve prolapse)   . Orthostatic hypotension   . Osteoporosis   . Peripheral neuropathy 06/09/2012  . Plantar fasciitis   . Pneumothorax    Past Surgical History:  Procedure Laterality Date  . COLONOSCOPY WITH PROPOFOL N/A 02/22/2013   Procedure: COLONOSCOPY WITH PROPOFOL;  Surgeon: Garlan Fair, MD;  Location: [facility];  Service: Endoscopy;  Laterality: N/A;  . MOUTH SURGERY  01/2012   bone graft in mouth  . NASAL SINUS SURGERY Right 06/12/2014   Procedure: RIGHT ENDOSCOPIC MAXILLARY ANTROSTOMY;  Surgeon: Ascencion Dike, MD;  Location: [facility];  Service: ENT;  Laterality: Right;  . ROOT CANAL  02/27/2012  . TONSILLECTOMY  1962     Current Meds  Medication Sig  . 5-Hydroxytryptophan (5-HTP) 50 MG CAPS Take 100 mg by mouth daily.   . Alpha-Lipoic Acid 600 MG TABS Take 600 mg by mouth daily.  . AMINO ACIDS COMPLEX PO Take 1 Scoop by mouth daily.   Francia Greaves THYROID 30 MG tablet Take 30 mg by mouth daily.  . Ascorbic Acid (VITAMIN C PO) Take 2,500 Units by mouth daily.   . B Complex  Vitamins (VITAMIN B COMPLEX PO) Take 1 tablet by mouth daily.  . Cholecalciferol (VITAMIN D3) 10000 units capsule Take 10,000 Units by mouth daily.   . Cyanocobalamin (VITAMIN B-12 ER PO) Take 5,000 mcg by mouth daily.   . Digestive Enzymes (DIGESTIVE ENZYME PO) Take 1 tablet by mouth daily.  [name] GLUTATHIONE PO Take 250 mg by mouth daily.  [name] MAGNESIUM CITRATE PO Take 800 mg by mouth daily.   . Menaquinone-7 (VITAMIN K2 PO) Take 1 tablet by mouth daily.  . Omega-3 Fatty Acids (FISH OIL) 1000 MG  CAPS Take 1,000 mg by mouth daily.  [name] zinc gluconate 50 MG tablet Take 50 mg by mouth daily.   Current Facility-Administered Medications for the 03/28/19 encounter (Telemedicine) with Lelon Perla, MD  Medication  . 0.9 %  sodium chloride infusion     Allergies:   Patient has no known allergies.   Social History   Tobacco Use  . Smoking status: Never Smoker  . Smokeless tobacco: Never Used  Substance Use Topics  . Alcohol use: No    Alcohol/week: 0.0 standard drinks  . Drug use: No     Family Hx: The patient's family history includes Angina in her mother; Breast cancer in her paternal grandmother; Colon cancer in her mother; Diabetes in her paternal grandmother; Heart failure in her father; Hyperlipidemia in her father; Hypertension in her father and mother; Kidney failure in her father; Lung cancer in her mother and paternal grandfather; Prostate cancer in her father. There is no history of Esophageal cancer, Liver cancer, Pancreatic cancer, Rectal cancer, or Stomach cancer.  ROS:   Please see the history of present illness.    No Fever, chills  or productive cough All other systems reviewed and are negative.  Recent Labs: 06/30/2018: TSH 2.78 03/03/2019: ALT 31; BUN 9; Creatinine 0.79; Hemoglobin 14.4; Platelets 205; Potassium 3.7; Sodium 143   Recent Lipid Panel Lab Results  Component Value Date/Time   CHOL  07/19/2008 06:25 AM    160        ATP III CLASSIFICATION:  <200     mg/dL   Desirable  200-239  mg/dL   Borderline High  >=240    mg/dL   High   TRIG 61 07/19/2008 06:25 AM   HDL 37 (L) 07/19/2008 06:25 AM   CHOLHDL 4.3 07/19/2008 06:25 AM   LDLCALC (H) 07/19/2008 06:25 AM    111        Total Cholesterol/HDL:CHD Risk Coronary Heart Disease Risk Table                     Men   Women  1/2 Average Risk   3.4   3.3    Wt Readings from Last 3 Encounters:  03/28/19 132 lb (59.9 kg)  03/03/19 134 lb 4.8 oz (60.9 kg)  03/01/19 134 lb (60.8 kg)     Objective:     Vital Signs:  Ht 5' 9.5" (1.765 m)   Wt 132 lb (59.9 kg)   BMI 19.21 kg/m    VITAL SIGNS:  reviewed NAD Answers questions appropriately Normal affect Remainder of physical examination not performed (telehealth visit; coronavirus pandemic)  ASSESSMENT & PLAN:    1. Aneurysmal dilatation of the interventricular septum extending into the right ventricular outflow tract-previous evaluation showed no ventricular septal defect and no sinus of Valsalva aneurysm.  No further intervention indicated at this point. 2. Hyperlipidemia-check lipids at next office visit. 3. Orthostatic hypotension-symptoms appear to be  reasonably well-controlled at this point.  Stressed the importance of increased fluid intake and sodium intake. 4. Chest discomfort-some vague symptoms under left ribs by her report.  Not exertional.  Previous calcium score 0.  We will not pursue further ischemia evaluation at this point.  COVID-19 Education: The importance of social distancing was discussed today.  Time:   Today, I have spent 25 minutes with the patient with telehealth technology discussing the above problems.     Medication Adjustments/Labs and Tests Ordered: Current medicines are reviewed at length with the patient today.  Concerns regarding medicines are outlined above.   Tests Ordered: No orders of the defined types were placed in this encounter.   Medication Changes: No orders of the defined types were placed in this encounter.   Follow Up:  Virtual Visit or In Person in 3 month(s)  Signed, Kirk Ruths, MD  03/28/2019 10:19 AM    [place]

## 2019-03-28 ENCOUNTER — Telehealth (INDEPENDENT_AMBULATORY_CARE_PROVIDER_SITE_OTHER): Payer: BC Managed Care – PPO | Admitting: Cardiology

## 2019-03-28 ENCOUNTER — Other Ambulatory Visit: Payer: Self-pay

## 2019-03-28 ENCOUNTER — Encounter: Payer: Self-pay | Admitting: Cardiology

## 2019-03-28 VITALS — Ht 69.5 in | Wt 132.0 lb

## 2019-03-28 DIAGNOSIS — I951 Orthostatic hypotension: Secondary | ICD-10-CM

## 2019-03-28 DIAGNOSIS — R072 Precordial pain: Secondary | ICD-10-CM

## 2019-03-28 DIAGNOSIS — E7800 Pure hypercholesterolemia, unspecified: Secondary | ICD-10-CM | POA: Diagnosis not present

## 2019-03-28 NOTE — Patient Instructions (Signed)
Medication Instructions:  NO CHANGE If you need a refill on your cardiac medications before your next appointment, please call your pharmacy.   Lab work: Your physician recommends that you return for lab work PRIOR TO EATING If you have labs (blood work) drawn today and your tests are completely normal, you will receive your results only by: [name] MyChart Message (if you have MyChart) OR . A paper copy in the mail If you have any lab test that is abnormal or we need to change your treatment, we will call you to review the results.  Follow-Up: At [facility], you and your health needs are our priority.  As part of our continuing mission to provide you with exceptional heart care, we have created designated Provider Care Teams.  These Care Teams include your primary Cardiologist (physician) and Advanced Practice Providers (APPs -  Physician Assistants and Nurse Practitioners) who all work together to provide you with the care you need, when you need it. Your physician recommends that you schedule a follow-up appointment in: [place]

## 2019-03-29 DIAGNOSIS — R5381 Other malaise: Secondary | ICD-10-CM | POA: Diagnosis not present

## 2019-03-29 DIAGNOSIS — R5383 Other fatigue: Secondary | ICD-10-CM | POA: Diagnosis not present

## 2019-03-29 DIAGNOSIS — J3489 Other specified disorders of nose and nasal sinuses: Secondary | ICD-10-CM | POA: Diagnosis not present

## 2019-03-29 DIAGNOSIS — R109 Unspecified abdominal pain: Secondary | ICD-10-CM | POA: Diagnosis not present

## 2019-03-30 DIAGNOSIS — F4322 Adjustment disorder with anxiety: Secondary | ICD-10-CM | POA: Diagnosis not present

## 2019-04-06 DIAGNOSIS — M3501 Sicca syndrome with keratoconjunctivitis: Secondary | ICD-10-CM | POA: Diagnosis not present

## 2019-04-06 DIAGNOSIS — H25813 Combined forms of age-related cataract, bilateral: Secondary | ICD-10-CM | POA: Diagnosis not present

## 2019-04-07 DIAGNOSIS — F4322 Adjustment disorder with anxiety: Secondary | ICD-10-CM | POA: Diagnosis not present

## 2019-04-08 ENCOUNTER — Other Ambulatory Visit: Payer: Self-pay | Admitting: *Deleted

## 2019-04-08 DIAGNOSIS — I739 Peripheral vascular disease, unspecified: Secondary | ICD-10-CM

## 2019-04-10 DIAGNOSIS — M62831 Muscle spasm of calf: Secondary | ICD-10-CM | POA: Diagnosis not present

## 2019-04-14 DIAGNOSIS — F4322 Adjustment disorder with anxiety: Secondary | ICD-10-CM | POA: Diagnosis not present

## 2019-04-15 ENCOUNTER — Other Ambulatory Visit: Payer: Self-pay

## 2019-04-15 ENCOUNTER — Ambulatory Visit (HOSPITAL_COMMUNITY)
Admission: RE | Admit: 2019-04-15 | Discharge: 2019-04-15 | Disposition: A | Discharge status: Home / Self Care | Payer: BC Managed Care – PPO | Source: Ambulatory Visit | Attending: Cardiology | Admitting: Cardiology

## 2019-04-15 DIAGNOSIS — I8312 Varicose veins of left lower extremity with inflammation: Secondary | ICD-10-CM | POA: Diagnosis not present

## 2019-04-15 DIAGNOSIS — I739 Peripheral vascular disease, unspecified: Secondary | ICD-10-CM

## 2019-04-15 DIAGNOSIS — I8311 Varicose veins of right lower extremity with inflammation: Secondary | ICD-10-CM | POA: Diagnosis not present

## 2019-04-25 DIAGNOSIS — J329 Chronic sinusitis, unspecified: Secondary | ICD-10-CM | POA: Diagnosis not present

## 2019-04-26 DIAGNOSIS — F4322 Adjustment disorder with anxiety: Secondary | ICD-10-CM | POA: Diagnosis not present

## 2019-04-27 DIAGNOSIS — J3489 Other specified disorders of nose and nasal sinuses: Secondary | ICD-10-CM | POA: Diagnosis not present

## 2019-04-27 DIAGNOSIS — R5381 Other malaise: Secondary | ICD-10-CM | POA: Diagnosis not present

## 2019-05-04 DIAGNOSIS — F4322 Adjustment disorder with anxiety: Secondary | ICD-10-CM | POA: Diagnosis not present

## 2019-05-06 DIAGNOSIS — H04123 Dry eye syndrome of bilateral lacrimal glands: Secondary | ICD-10-CM | POA: Diagnosis not present

## 2019-05-09 DIAGNOSIS — F4322 Adjustment disorder with anxiety: Secondary | ICD-10-CM | POA: Diagnosis not present

## 2019-05-11 ENCOUNTER — Encounter: Payer: Self-pay | Admitting: Hematology

## 2019-05-11 ENCOUNTER — Telehealth: Payer: Self-pay | Admitting: Hematology

## 2019-05-11 ENCOUNTER — Inpatient Hospital Stay: Payer: BC Managed Care – PPO

## 2019-05-11 ENCOUNTER — Inpatient Hospital Stay: Payer: BC Managed Care – PPO | Admitting: Hematology

## 2019-05-11 DIAGNOSIS — L570 Actinic keratosis: Secondary | ICD-10-CM | POA: Diagnosis not present

## 2019-05-11 DIAGNOSIS — L438 Other lichen planus: Secondary | ICD-10-CM | POA: Diagnosis not present

## 2019-05-11 NOTE — Telephone Encounter (Signed)
Returned patient's phone call regarding rescheduling an appointment, left a voicemail. 

## 2019-05-11 NOTE — Telephone Encounter (Signed)
Patient returned phone call regarding rescheduling 09/16 appointment, per pt's request appointment has been moved to 09/29.

## 2019-05-12 DIAGNOSIS — H43811 Vitreous degeneration, right eye: Secondary | ICD-10-CM | POA: Diagnosis not present

## 2019-05-17 DIAGNOSIS — R05 Cough: Secondary | ICD-10-CM | POA: Diagnosis not present

## 2019-05-18 DIAGNOSIS — F4322 Adjustment disorder with anxiety: Secondary | ICD-10-CM | POA: Diagnosis not present

## 2019-05-23 DIAGNOSIS — R5381 Other malaise: Secondary | ICD-10-CM | POA: Diagnosis not present

## 2019-05-23 DIAGNOSIS — E559 Vitamin D deficiency, unspecified: Secondary | ICD-10-CM | POA: Diagnosis not present

## 2019-05-23 DIAGNOSIS — R5383 Other fatigue: Secondary | ICD-10-CM | POA: Diagnosis not present

## 2019-05-23 DIAGNOSIS — J3489 Other specified disorders of nose and nasal sinuses: Secondary | ICD-10-CM | POA: Diagnosis not present

## 2019-05-23 DIAGNOSIS — E039 Hypothyroidism, unspecified: Secondary | ICD-10-CM | POA: Diagnosis not present

## 2019-05-23 DIAGNOSIS — D510 Vitamin B12 deficiency anemia due to intrinsic factor deficiency: Secondary | ICD-10-CM | POA: Diagnosis not present

## 2019-05-23 DIAGNOSIS — K146 Glossodynia: Secondary | ICD-10-CM | POA: Diagnosis not present

## 2019-05-24 ENCOUNTER — Inpatient Hospital Stay: Payer: BC Managed Care – PPO | Attending: Gynecologic Oncology

## 2019-05-24 ENCOUNTER — Telehealth: Payer: Self-pay | Admitting: Hematology

## 2019-05-24 ENCOUNTER — Other Ambulatory Visit: Payer: Self-pay

## 2019-05-24 ENCOUNTER — Inpatient Hospital Stay (HOSPITAL_BASED_OUTPATIENT_CLINIC_OR_DEPARTMENT_OTHER): Payer: BC Managed Care – PPO | Admitting: Hematology

## 2019-05-24 VITALS — BP 105/49 | HR 65 | Temp 98.5°F | Resp 18 | Ht 69.5 in | Wt 128.5 lb

## 2019-05-24 DIAGNOSIS — D72819 Decreased white blood cell count, unspecified: Secondary | ICD-10-CM | POA: Diagnosis not present

## 2019-05-24 DIAGNOSIS — D510 Vitamin B12 deficiency anemia due to intrinsic factor deficiency: Secondary | ICD-10-CM | POA: Diagnosis not present

## 2019-05-24 LAB — CBC WITH DIFFERENTIAL/PLATELET
Abs Immature Granulocytes: 0.01 10*3/uL (ref 0.00–0.07)
Basophils Absolute: 0 10*3/uL (ref 0.0–0.1)
Basophils Relative: 1 %
Eosinophils Absolute: 0 10*3/uL (ref 0.0–0.5)
Eosinophils Relative: 1 %
HCT: 39.9 % (ref 36.0–46.0)
Hemoglobin: 13 g/dL (ref 12.0–15.0)
Immature Granulocytes: 0 %
Lymphocytes Relative: 38 %
Lymphs Abs: 1.2 10*3/uL (ref 0.7–4.0)
MCH: 30.1 pg (ref 26.0–34.0)
MCHC: 32.6 g/dL (ref 30.0–36.0)
MCV: 92.4 fL (ref 80.0–100.0)
Monocytes Absolute: 0.3 10*3/uL (ref 0.1–1.0)
Monocytes Relative: 9 %
Neutro Abs: 1.6 10*3/uL — ABNORMAL LOW (ref 1.7–7.7)
Neutrophils Relative %: 51 %
Platelets: 202 10*3/uL (ref 150–400)
RBC: 4.32 MIL/uL (ref 3.87–5.11)
RDW: 12.8 % (ref 11.5–15.5)
WBC: 3.1 10*3/uL — ABNORMAL LOW (ref 4.0–10.5)
nRBC: 0 % (ref 0.0–0.2)

## 2019-05-24 LAB — FERRITIN: Ferritin: 19 ng/mL (ref 11–307)

## 2019-05-24 MED ORDER — MAGIC MOUTHWASH W/LIDOCAINE: 5.0000 mL | Freq: Three times a day (TID) | ORAL | 1 refills | Status: DC | PRN

## 2019-05-24 NOTE — Telephone Encounter (Signed)
Scheduled per 09/29, patient received calender and after visit summary.

## 2019-05-24 NOTE — Progress Notes (Signed)
HEMATOLOGY/ONCOLOGY CONSULTATION NOTE  Date of Service: 05/24/2019  Patient Care Team: Lavone Orn, MD as PCP - General (Internal Medicine) Beryle Beams Alyson Locket, MD as Consulting Physician (Oncology) Alda Berthold, DO as Consulting Physician (Neurology) Arnetha Gula, MD as Consulting Physician (Family Medicine) Gerda Diss, DO as Consulting Physician (Sports Medicine) Brunetta Genera, MD as Consulting Physician (Hematology)  CHIEF COMPLAINTS/PURPOSE OF CONSULTATION:  Chronic Idiopathic Neutropenia  HISTORY OF PRESENTING ILLNESS:   Megan [name] is a wonderful 63 y.o. female who has been referred to [place] by Dr. Murriel Hopper for evaluation and management of Chronic idiopathic neutropenia. The pt reports that she is doing well overall.   The pt reports a history of mold exposure, chronic mild neutropenia, and neuropathy. She denies frequent or abnormal infections.   The pt notes that she was first diagnosed with neuropathy in 2010, and has seen five neurologists. She endorses weakness and tingling in feet into mid-calf which has improved over the past 10 years. She has current weakness and concern for balance which limits her activity. She adds that the bottom of her feet occasionally become dark purple. She has had doppler studies and notes these ruled out vascular problems. She has seen holistic health practitioners in [place] in the past. She had a four out of five positives on western blot which revealed concerns for Lyme disease but apparently was not conclusive. She previously lived in [place]. She denies ever having a bulls eye rash.  The pt notes some difficulty with writing, which has not been progressive.   The pt has recently been worked up for elevated estrogen levels and a previous cyst, and was previously taking Biotin. She stopped taking Boitin and has seen normalized estrogen levels. She notes that repeat MRI revealed the absence of the  previously seen cyst  The pt also reports a history of polyclonal gammopathy.   She notes that her Ferritin was seen to be at 26 about 1.5 years ago. She has been a vegan and then began eating some meat protein. She also took PO iron replacement which was constipating. She also has taken Blood builder. Her Ferritin has recently decreased to a 10. Pt denies blood in the stools nor black stools. She had a colonoscopy about 1.5 years ago and notes this was unremarkable. She denies unexpected weight loss. She denies using acid suppressants. She endorses having pernicious anemia. Pt takes 5064mcg Vitamin B12 daily. She is also taking 10k units Vitamin D3 five days a week.   The pt notes that she has osteoporosis, with L1-L4 with score of -3.5. She has been disinclined to pursue Prolia injections. She has been consuming almond milk and avoids dairy products. She took Fosamax three years ago.  Most recent lab results (08/22/18) of CBC is as follows: all values are WNL except for WBC at 3.5k.  On review of systems, pt reports neuropathy with weakness in feet, stable energy levels, constipation, and denies blood in the stools, black stools, abdominal pains, and any other symptoms.   On PMHx the pt reports neuropathy, pernicious anemia, osteoporosis.  INTERVAL HISTORY:   Megan [name] is a wonderful 63 y.o. female who is here for evaluation and management of Chronic idiopathic neutropenia. The patient's last visit with [place] was on 03/03/2019. The pt reports that she is doing well overall.  The pt reports that she was not able to continue taking the PO iron polysaccharide. After four days she had to discontinue  as she was experiencing severe constipation. She has been eating chicken or other meat 3x a week to try to increase her iron. Pt has a burning sensation in her mouth and has been told by other physicians that she could have "burning mouth syndrome" which would be caused by lower Vitamin B12 levels. She  does not have any mouth sores. Pt had previously had geographic tongue which did not hurt. Some time later her tongue began burning as well as the areas in which she had teeth taken out. Now her entire mouth burns. The pain is so bad that it has kept her up a couple of nights. Before it was thought that she had a fungal infection in her mouth and was given Nystatin which did not help. She has seen her PCP, a dermatologist and a dentist about her mouth. She is taking SL Vitamin B12 and a B-complex.   Lab results today (05/24/19) of CBC w/diff and CMP is as follows: all values are WNL except for WBC 3.1K, Neutro Abs at 1.6K. 05/24/2019 Ferritin at 19  On review of systems, pt reports burning mouth and denies mouth sores, abdominal pain, current constipation and any other symptoms.   MEDICAL HISTORY:  Past Medical History:  Diagnosis Date  . Allergy   . Aneurysm of cardiac wall, congenital    a.  septal aneurysm extending into the RVOT (by cardiac MRI in 08/2013)  . Anxiety   . Arthritis   . Asthma   . Depression   . Heart murmur   . Hypothyroidism   . Lyme disease   . MVP (mitral valve prolapse)   . Orthostatic hypotension   . Osteoporosis   . Peripheral neuropathy 06/09/2012  . Plantar fasciitis   . Pneumothorax     SURGICAL HISTORY: Past Surgical History:  Procedure Laterality Date  . COLONOSCOPY WITH PROPOFOL N/A 02/22/2013   Procedure: COLONOSCOPY WITH PROPOFOL;  Surgeon: Garlan Fair, MD;  Location: [facility];  Service: Endoscopy;  Laterality: N/A;  . MOUTH SURGERY  01/2012   bone graft in mouth  . NASAL SINUS SURGERY Right 06/12/2014   Procedure: RIGHT ENDOSCOPIC MAXILLARY ANTROSTOMY;  Surgeon: Ascencion Dike, MD;  Location: [facility];  Service: ENT;  Laterality: Right;  . ROOT CANAL  02/27/2012  . TONSILLECTOMY  1962    SOCIAL HISTORY: Social History   Socioeconomic History  . Marital status: Divorced    Spouse name: Not on file  . Number of  children: 0  . Years of education: Not on file  . Highest education level: Not on file  Occupational History    Comment: Career and life coach  Social Needs  . Financial resource strain: Not on file  . Food insecurity    Worry: Not on file    Inability: Not on file  . Transportation needs    Medical: Not on file    Non-medical: Not on file  Tobacco Use  . Smoking status: Never Smoker  . Smokeless tobacco: Never Used  Substance and Sexual Activity  . Alcohol use: No    Alcohol/week: 0.0 standard drinks  . Drug use: No  . Sexual activity: Not on file  Lifestyle  . Physical activity    Days per week: Not on file    Minutes per session: Not on file  . Stress: Not on file  Relationships  . Social Herbalist on phone: Not on file    Gets together: Not on  file    Attends religious service: Not on file    Active member of club or organization: Not on file    Attends meetings of clubs or organizations: Not on file    Relationship status: Not on file  . Intimate partner violence    Fear of current or ex partner: Not on file    Emotionally abused: Not on file    Physically abused: Not on file    Forced sexual activity: Not on file  Other Topics Concern  . Not on file  Social History Narrative   Patient is single and lives alone.   Patient is self-employed, career and life coaching.   Patient drinks two to four cups of caffeine daily.    FAMILY HISTORY: Family History  Problem Relation Age of Onset  . Hyperlipidemia Father   . Heart failure Father   . Prostate cancer Father   . Kidney failure Father   . Hypertension Father   . Angina Mother   . Hypertension Mother   . Lung cancer Mother   . Colon cancer Mother   . Lung cancer Paternal Grandfather   . Breast cancer Paternal Grandmother   . Diabetes Paternal Grandmother   . Esophageal cancer Neg Hx   . Liver cancer Neg Hx   . Pancreatic cancer Neg Hx   . Rectal cancer Neg Hx   . Stomach cancer Neg Hx      ALLERGIES:  has No Known Allergies.  MEDICATIONS:  Current Outpatient Medications  Medication Sig Dispense Refill  . 5-Hydroxytryptophan (5-HTP) 50 MG CAPS Take 100 mg by mouth daily.     . Alpha-Lipoic Acid 600 MG TABS Take 600 mg by mouth daily.    . AMINO ACIDS COMPLEX PO Take 1 Scoop by mouth daily.     Francia Greaves THYROID 30 MG tablet Take 30 mg by mouth daily.  6  . Ascorbic Acid (VITAMIN C PO) Take 2,500 Units by mouth daily.     . B Complex Vitamins (VITAMIN B COMPLEX PO) Take 1 tablet by mouth daily.    . Cholecalciferol (VITAMIN D3) 10000 units capsule Take 10,000 Units by mouth daily.     . Cyanocobalamin (VITAMIN B-12 ER PO) Take 5,000 mcg by mouth daily.     . Digestive Enzymes (DIGESTIVE ENZYME PO) Take 1 tablet by mouth daily.    [name] GLUTATHIONE PO Take 250 mg by mouth daily.    . magic mouthwash w/lidocaine SOLN Take 5-10 mLs by mouth 3 (three) times daily as needed for mouth pain. 480 mL 1  . MAGNESIUM CITRATE PO Take 800 mg by mouth daily.     . Menaquinone-7 (VITAMIN K2 PO) Take 1 tablet by mouth daily.    . Omega-3 Fatty Acids (FISH OIL) 1000 MG CAPS Take 1,000 mg by mouth daily.    [name] zinc gluconate 50 MG tablet Take 50 mg by mouth daily.     Current Facility-Administered Medications  Medication Dose Route Frequency Provider Last Rate Last Dose  . 0.9 %  sodium chloride infusion  500 mL Intravenous Once Pyrtle, [name], MD        REVIEW OF SYSTEMS:    A 10+ POINT REVIEW OF SYSTEMS WAS OBTAINED including neurology, dermatology, psychiatry, cardiac, respiratory, lymph, extremities, GI, GU, Musculoskeletal, constitutional, breasts, reproductive, HEENT.  All pertinent positives are noted in the HPI.  All others are negative.   PHYSICAL EXAMINATION:  . Vitals:   05/24/19 1521  BP: (!) 105/49  Pulse: 65  Resp: 18  Temp: 98.5 F (36.9 C)  SpO2: 100%   Filed Weights   05/24/19 1521  Weight: 128 lb 8 oz (58.3 kg)   .Body mass index is 18.7 kg/m.    GENERAL:alert, in no acute distress and comfortable SKIN: no acute rashes, no significant lesions EYES: conjunctiva are pink and non-injected, sclera anicteric OROPHARYNX: MMM, no exudates, no oropharyngeal erythema or ulceration NECK: supple, no JVD LYMPH:  no palpable lymphadenopathy in the cervical, axillary or inguinal regions LUNGS: clear to auscultation b/l with normal respiratory effort HEART: regular rate & rhythm ABDOMEN:  normoactive bowel sounds , non tender, not distended. No palpable hepatosplenomegaly.  Extremity: no pedal edema PSYCH: alert & oriented x 3 with fluent speech NEURO: no focal motor/sensory deficits  LABORATORY DATA:  I have reviewed the data as listed  . CBC Latest Ref Rng & Units 05/24/2019 03/03/2019 08/22/2018  WBC 4.0 - 10.5 K/uL 3.1(L) 3.5(L) 3.5(L)  Hemoglobin 12.0 - 15.0 g/dL 13.0 14.4 12.2  Hematocrit 36.0 - 46.0 % 39.9 43.8 38.4  Platelets 150 - 400 K/uL 202 205 223    . CMP Latest Ref Rng & Units 03/03/2019 08/22/2018 03/20/2018  Glucose 70 - 99 mg/dL 71 82 120(H)  BUN 8 - 23 mg/dL 9 6(L) 11  Creatinine 0.44 - 1.00 mg/dL 0.79 0.65 0.69  Sodium 135 - 145 mmol/L 143 142 141  Potassium 3.5 - 5.1 mmol/L 3.7 3.4(L) 4.3  Chloride 98 - 111 mmol/L 104 105 102  CO2 22 - 32 mmol/L 28 26 27   Calcium 8.9 - 10.3 mg/dL 9.7 9.2 9.4  Total Protein 6.5 - 8.1 g/dL 7.5 - -  Total Bilirubin 0.3 - 1.2 mg/dL 1.0 - -  Alkaline Phos 38 - 126 U/L 86 - -  AST 15 - 41 U/L 50(H) - -  ALT 0 - 44 U/L 31 - -   . Lab Results  Component Value Date   IRON 164 (H) 03/03/2019   TIBC 439 03/03/2019   IRONPCTSAT 37 03/03/2019   (Iron and TIBC)  Lab Results  Component Value Date   FERRITIN 19 05/24/2019     RADIOGRAPHIC STUDIES: I have personally reviewed the radiological images as listed and agreed with the findings in the report. No results found.  ASSESSMENT & PLAN:  63 y.o. female with  1. Chronic idiopathic leucopenia. WBC count today low normal @ 3.5k  ANC 1900 ( no neutropenia) 2. Pernicious anemia -12/30/17 Antiparietal cell antibodies which were high at 105.9  PLAN: -Discussed pt labwork today, 05/24/19; all values are WNL except for WBC 3.1K, Neutro Abs at 1.6K. -Discussed 05/24/2019 Ferritin at 19 -Discussed 03/03/2019 Vitamin B12 at 1313 -Discussed 03/03/2019 Parietal Cell Antibody IgG at 98.0 -Discussed 03/03/2019 Intrinsic factor antibodies at 1.9 -Discussed 03/03/2019 Iron and TIBC is as follows: Iron at 164, TIBC at 439, Sat Ratios at 37, UIBC at 275 -Pt appears to have pernicious anemia  -Discussed that she has demonstrated tendency to autoimmunity which can be associated with thyroid dysfunction (which the pt has), and also autoimmune leukopenia. -Pt's leukopenia has been mild over the last several years and non-progressive -Continue SL Vitamin B12  -Continue empiric Vitamin B complex -Recommend continuing Vitamin D replacement, could consider 50kunit Ergocalciferol once a week and goal of 60-90. -Recommended baking soda/salt mouth rinse -May order IV iron if Ferritin levels drop and are not maintained through diet  -Rx magic mouthwash   -Labs in 6 months, either at [facility]  or with PCP -Will see the pt back in 12 months with labs   FOLLOW UP: Labs in 6 months RTC with Dr Irene Limbo in 12 months with labs  The total time spent in the appt was 20 minutes and more than 50% was on counseling and direct patient cares.  All of the patient's questions were answered with apparent satisfaction. The patient knows to call the clinic with any problems, questions or concerns.    Sullivan Lone MD [place] AAHIVMS [facility] Hematology/Oncology Physician [facility]  (Office):       [phone] (Work cell):  [phone] (Fax):           [phone]  05/24/2019 4:13 PM  I, Yevette Edwards, am acting as a scribe for Dr. Sullivan Lone.   .I have reviewed the above documentation for accuracy and completeness, and I agree  with the above. Brunetta Genera MD

## 2019-05-25 DIAGNOSIS — F4322 Adjustment disorder with anxiety: Secondary | ICD-10-CM | POA: Diagnosis not present

## 2019-05-26 DIAGNOSIS — G609 Hereditary and idiopathic neuropathy, unspecified: Secondary | ICD-10-CM | POA: Diagnosis not present

## 2019-05-26 DIAGNOSIS — R5383 Other fatigue: Secondary | ICD-10-CM | POA: Diagnosis not present

## 2019-05-26 DIAGNOSIS — M3500 Sicca syndrome, unspecified: Secondary | ICD-10-CM | POA: Diagnosis not present

## 2019-05-26 DIAGNOSIS — K1379 Other lesions of oral mucosa: Secondary | ICD-10-CM | POA: Diagnosis not present

## 2019-05-27 ENCOUNTER — Encounter: Payer: Self-pay | Admitting: Hematology

## 2019-05-31 ENCOUNTER — Telehealth: Payer: Self-pay | Admitting: *Deleted

## 2019-05-31 DIAGNOSIS — F4322 Adjustment disorder with anxiety: Secondary | ICD-10-CM | POA: Diagnosis not present

## 2019-05-31 NOTE — Telephone Encounter (Signed)
Patient called - stated she had received phone call and wanted to think about the IV iron and call office if she decides to get it. She is seeing another MD in a few days and will also discuss with them

## 2019-05-31 NOTE — Telephone Encounter (Signed)
Attempted to contact patient regarding test results per Dr. Grier Mitts directions in message below. Left voice mail on named voice mail with information and asked her to contact office if she wants to proceed with IV iron.  Also informed patient that results have been released to MyChart at this time.

## 2019-05-31 NOTE — Telephone Encounter (Signed)
-----   Message from Brunetta Genera, MD sent at 05/30/2019 10:40 PM EDT ----- Plz let patient know her ferritin is down to 19--- would offer her option for IV iron if she chooses. If not will recheck labs in 6 months as planned.

## 2019-06-01 DIAGNOSIS — M3500 Sicca syndrome, unspecified: Secondary | ICD-10-CM | POA: Diagnosis not present

## 2019-06-01 DIAGNOSIS — G609 Hereditary and idiopathic neuropathy, unspecified: Secondary | ICD-10-CM | POA: Diagnosis not present

## 2019-06-01 DIAGNOSIS — K146 Glossodynia: Secondary | ICD-10-CM | POA: Diagnosis not present

## 2019-06-01 DIAGNOSIS — E039 Hypothyroidism, unspecified: Secondary | ICD-10-CM | POA: Diagnosis not present

## 2019-06-01 DIAGNOSIS — M79605 Pain in left leg: Secondary | ICD-10-CM | POA: Diagnosis not present

## 2019-06-01 DIAGNOSIS — K589 Irritable bowel syndrome without diarrhea: Secondary | ICD-10-CM | POA: Diagnosis not present

## 2019-06-01 DIAGNOSIS — E611 Iron deficiency: Secondary | ICD-10-CM | POA: Diagnosis not present

## 2019-06-01 DIAGNOSIS — M79604 Pain in right leg: Secondary | ICD-10-CM | POA: Diagnosis not present

## 2019-06-08 DIAGNOSIS — Z77120 Contact with and (suspected) exposure to mold (toxic): Secondary | ICD-10-CM | POA: Diagnosis not present

## 2019-06-09 DIAGNOSIS — F4322 Adjustment disorder with anxiety: Secondary | ICD-10-CM | POA: Diagnosis not present

## 2019-06-15 ENCOUNTER — Telehealth (HOSPITAL_COMMUNITY): Payer: Self-pay

## 2019-06-15 ENCOUNTER — Other Ambulatory Visit: Payer: Self-pay

## 2019-06-15 DIAGNOSIS — I83893 Varicose veins of bilateral lower extremities with other complications: Secondary | ICD-10-CM

## 2019-06-15 DIAGNOSIS — F4322 Adjustment disorder with anxiety: Secondary | ICD-10-CM | POA: Diagnosis not present

## 2019-06-15 NOTE — Telephone Encounter (Signed)

## 2019-06-16 ENCOUNTER — Ambulatory Visit (INDEPENDENT_AMBULATORY_CARE_PROVIDER_SITE_OTHER): Payer: BC Managed Care – PPO | Admitting: Vascular Surgery

## 2019-06-16 ENCOUNTER — Ambulatory Visit (HOSPITAL_COMMUNITY)
Admission: RE | Admit: 2019-06-16 | Discharge: 2019-06-16 | Disposition: A | Discharge status: Home / Self Care | Payer: BC Managed Care – PPO | Source: Ambulatory Visit | Attending: Vascular Surgery | Admitting: Vascular Surgery

## 2019-06-16 ENCOUNTER — Encounter: Payer: Self-pay | Admitting: Vascular Surgery

## 2019-06-16 ENCOUNTER — Other Ambulatory Visit: Payer: Self-pay

## 2019-06-16 VITALS — BP 98/56 | HR 71 | Temp 97.9°F | Resp 18 | Ht 69.5 in | Wt 128.0 lb

## 2019-06-16 DIAGNOSIS — R1084 Generalized abdominal pain: Secondary | ICD-10-CM | POA: Diagnosis not present

## 2019-06-16 DIAGNOSIS — M79674 Pain in right toe(s): Secondary | ICD-10-CM | POA: Diagnosis not present

## 2019-06-16 DIAGNOSIS — I83813 Varicose veins of bilateral lower extremities with pain: Secondary | ICD-10-CM | POA: Diagnosis not present

## 2019-06-16 DIAGNOSIS — I83893 Varicose veins of bilateral lower extremities with other complications: Secondary | ICD-10-CM | POA: Diagnosis not present

## 2019-06-16 DIAGNOSIS — R195 Other fecal abnormalities: Secondary | ICD-10-CM | POA: Diagnosis not present

## 2019-06-16 NOTE — Progress Notes (Signed)
Patient name: Megan [name] MRN: [id number] DOB: 1956/05/18 Sex: female  HPI: Megan [name] is a 63 y.o. female, complaints of occasional swelling in her lower extremities.  She also has numbness and tingling in her feet.  She complains that her calves are sore and swollen on occasion.  She also complains of nighttime cramps in her legs.  A few weeks ago she had some pain in the posterior aspect of her right knee but this is now resolved.  She states that she has had color changes and duskiness in the bottom of her feet especially when these are dependent or in a cold room.  She thinks that she may have peripheral neuropathy however all of her previous testing was negative.  She denies prior history of DVT.  She does have a family history of varicose veins in her mother.  She also has a family history of aneurysm disease in the leg in her father.  Other medical problems include asthma, arthritis, depression all of which have been stable.  Past Medical History:  Diagnosis Date  . Allergy   . Aneurysm of cardiac wall, congenital    a.  septal aneurysm extending into the RVOT (by cardiac MRI in 08/2013)  . Anxiety   . Arthritis   . Asthma   . Depression   . Heart murmur   . Hypothyroidism   . Lyme disease   . MVP (mitral valve prolapse)   . Orthostatic hypotension   . Osteoporosis   . Peripheral neuropathy 06/09/2012  . Plantar fasciitis   . Pneumothorax    Past Surgical History:  Procedure Laterality Date  . COLONOSCOPY WITH PROPOFOL N/A 02/22/2013   Procedure: COLONOSCOPY WITH PROPOFOL;  Surgeon: Garlan Fair, MD;  Location: [facility];  Service: Endoscopy;  Laterality: N/A;  . MOUTH SURGERY  01/2012   bone graft in mouth  . NASAL SINUS SURGERY Right 06/12/2014   Procedure: RIGHT ENDOSCOPIC MAXILLARY ANTROSTOMY;  Surgeon: Ascencion Dike, MD;  Location: [facility];  Service: ENT;  Laterality: Right;  . ROOT CANAL  02/27/2012  . TONSILLECTOMY  1962    Family  History  Problem Relation Age of Onset  . Hyperlipidemia Father   . Heart failure Father   . Prostate cancer Father   . Kidney failure Father   . Hypertension Father   . Angina Mother   . Hypertension Mother   . Lung cancer Mother   . Colon cancer Mother   . Lung cancer Paternal Grandfather   . Breast cancer Paternal Grandmother   . Diabetes Paternal Grandmother   . Esophageal cancer Neg Hx   . Liver cancer Neg Hx   . Pancreatic cancer Neg Hx   . Rectal cancer Neg Hx   . Stomach cancer Neg Hx     SOCIAL HISTORY: Social History   Socioeconomic History  . Marital status: Divorced    Spouse name: Not on file  . Number of children: 0  . Years of education: Not on file  . Highest education level: Not on file  Occupational History    Comment: Career and life coach  Social Needs  . Financial resource strain: Not on file  . Food insecurity    Worry: Not on file    Inability: Not on file  . Transportation needs    Medical: Not on file    Non-medical: Not on file  Tobacco Use  . Smoking status: Never Smoker  . Smokeless tobacco: Never  Used  Substance and Sexual Activity  . Alcohol use: No    Alcohol/week: 0.0 standard drinks  . Drug use: No  . Sexual activity: Not on file  Lifestyle  . Physical activity    Days per week: Not on file    Minutes per session: Not on file  . Stress: Not on file  Relationships  . Social Herbalist on phone: Not on file    Gets together: Not on file    Attends religious service: Not on file    Active member of club or organization: Not on file    Attends meetings of clubs or organizations: Not on file    Relationship status: Not on file  . Intimate partner violence    Fear of current or ex partner: Not on file    Emotionally abused: Not on file    Physically abused: Not on file    Forced sexual activity: Not on file  Other Topics Concern  . Not on file  Social History Narrative   Patient is single and lives alone.    Patient is self-employed, career and life coaching.   Patient drinks two to four cups of caffeine daily.    No Known Allergies  Current Outpatient Medications  Medication Sig Dispense Refill  . 5-Hydroxytryptophan (5-HTP) 50 MG CAPS Take 100 mg by mouth daily.     . Alpha-Lipoic Acid 600 MG TABS Take 600 mg by mouth daily.    . AMINO ACIDS COMPLEX PO Take 1 Scoop by mouth daily.     Francia Greaves THYROID 30 MG tablet Take 30 mg by mouth daily.  6  . Ascorbic Acid (VITAMIN C PO) Take 2,500 Units by mouth daily.     . B Complex Vitamins (VITAMIN B COMPLEX PO) Take 1 tablet by mouth daily.    . Cholecalciferol (VITAMIN D3) 10000 units capsule Take 10,000 Units by mouth daily.     . Cyanocobalamin (VITAMIN B-12 ER PO) Take 5,000 mcg by mouth daily.     . Digestive Enzymes (DIGESTIVE ENZYME PO) Take 1 tablet by mouth daily.    [name] GLUTATHIONE PO Take 250 mg by mouth daily.    . magic mouthwash w/lidocaine SOLN Take 5-10 mLs by mouth 3 (three) times daily as needed for mouth pain. 480 mL 1  . MAGNESIUM CITRATE PO Take 800 mg by mouth daily.     . Menaquinone-7 (VITAMIN K2 PO) Take 1 tablet by mouth daily.    . Omega-3 Fatty Acids (FISH OIL) 1000 MG CAPS Take 1,000 mg by mouth daily.    [name] thyroid (ARMOUR THYROID) 15 MG tablet Take 15 mg by mouth daily. Take with 30 mg tab 2 times weekly for total of 45 mg    . zinc gluconate 50 MG tablet Take 50 mg by mouth daily.     Current Facility-Administered Medications  Medication Dose Route Frequency Provider Last Rate Last Dose  . 0.9 %  sodium chloride infusion  500 mL Intravenous Once Pyrtle, [name], MD        ROS:   General:  No weight loss, Fever, chills  HEENT: No recent headaches, no nasal bleeding, no visual changes, no sore throat  Neurologic: Occasional dizziness, no blackouts, no seizures. No recent symptoms of stroke or mini- stroke. No recent episodes of slurred speech, or temporary blindness.  Cardiac: No recent episodes of chest  pain/pressure, no shortness of breath at rest.  No shortness of breath with exertion.  Denies history of atrial fibrillation or irregular heartbeat  Vascular: No history of rest pain in feet.  No history of claudication.  No history of non-healing ulcer, No history of DVT   Pulmonary: No home oxygen, no productive cough, no hemoptysis,  No wheezing  Musculoskeletal:  [ ]  Arthritis, [ ]  Low back pain,  [X]  Joint pain  Hematologic:No history of hypercoagulable state.  No history of easy bleeding.  No history of anemia  Gastrointestinal: No hematochezia or melena,  No gastroesophageal reflux, no trouble swallowing  Urinary: [ ]  chronic Kidney disease, [ ]  on HD - [ ]  MWF or [ ]  TTHS, [ ]  Burning with urination, [ ]  Frequent urination, [ ]  Difficulty urinating;   Skin: No rashes  Psychological: No history of anxiety,  + history of depression   Physical Examination  Vitals:   06/16/19 1435  BP: (!) 98/56  Pulse: 71  Resp: 18  Temp: 97.9 F (36.6 C)  SpO2: 100%  Weight: 128 lb (58.1 kg)  Height: 5' 9.5" (1.765 m)    Body mass index is 18.63 kg/m.  General:  Alert and oriented, no acute distress HEENT: Normal Neck: No JVD Cardiac: Regular Rate and Rhythm Abdomen: Soft, non-tender, non-distended, no mass Skin: No rash, scattered spider type varicosities in the thigh and calf bilaterally more prominent varicose vein right medial calf 4 mm diameter Extremity Pulses:  2+ radial, brachial, femoral, dorsalis pedis, posterior tibial pulses bilaterally Musculoskeletal: No deformity or edema  Neurologic: Upper and lower extremity motor 5/5 and symmetric  DATA:  Patient had a venous reflux exam today.  This did show some mild reflux in the lesser saphenous vein in the right leg but vein was only 2 mm diameter.  She also had mild reflux left mid greater saphenous.  There was no reflux in the right greater saphenous vein  ASSESSMENT: #1 dusky discoloration of her feet.  The patient  inquired whether or not she may have Raynaud's.  I believe this is more just related to sympathetic tone and reassured her that she has no significant arterial or venous disease in her lower extremities.  I do not believe she needs a work-up for Raynaud's as she generally does not have any skin breakdown or other symptoms other than and discoloration.  I told her to avoid putting her feet into cold situations.  #2 varicose veins mainly spider type varicosities.  I discussed with the patient that we could certainly schedule her for sclerotherapy in the future if she wished to have some of these injected.   PLAN: See above   Ruta Hinds, MD [facility] Office: [phone] Pager: [phone]

## 2019-06-17 DIAGNOSIS — R5383 Other fatigue: Secondary | ICD-10-CM | POA: Diagnosis not present

## 2019-06-20 DIAGNOSIS — R195 Other fecal abnormalities: Secondary | ICD-10-CM | POA: Diagnosis not present

## 2019-06-22 DIAGNOSIS — G959 Disease of spinal cord, unspecified: Secondary | ICD-10-CM | POA: Diagnosis not present

## 2019-06-22 DIAGNOSIS — I69898 Other sequelae of other cerebrovascular disease: Secondary | ICD-10-CM | POA: Diagnosis not present

## 2019-06-22 DIAGNOSIS — M4802 Spinal stenosis, cervical region: Secondary | ICD-10-CM | POA: Diagnosis not present

## 2019-06-23 ENCOUNTER — Encounter: Payer: Self-pay | Admitting: *Deleted

## 2019-06-23 DIAGNOSIS — F4322 Adjustment disorder with anxiety: Secondary | ICD-10-CM | POA: Diagnosis not present

## 2019-06-23 DIAGNOSIS — E7800 Pure hypercholesterolemia, unspecified: Secondary | ICD-10-CM | POA: Diagnosis not present

## 2019-06-23 LAB — LIPID PANEL
Chol/HDL Ratio: 2.4 ratio (ref 0.0–4.4)
Cholesterol, Total: 239 mg/dL — ABNORMAL HIGH (ref 100–199)
HDL: 99 mg/dL (ref >39)
LDL Chol Calc (NIH): 131 mg/dL — ABNORMAL HIGH (ref 0–99)
Triglycerides: 55 mg/dL (ref 0–149)
VLDL Cholesterol Cal: 9 mg/dL (ref 5–40)

## 2019-06-27 DIAGNOSIS — F4322 Adjustment disorder with anxiety: Secondary | ICD-10-CM | POA: Diagnosis not present

## 2019-06-27 NOTE — Progress Notes (Signed)
HPI: FUdyspnea. MRA January 2015 showed aneurysmal dilatation of the membranous ventricular septum extending into the right ventricular outflow tract. There was no sinus of Valsalva aneurysm. Ejection fraction 57%. Carotid Dopplers August 2016 normal. Echocardiogram May 2018 showed normal LV systolic function and mild mitral regurgitation. 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 Lower ext arterial Dopplers May 2018 normal.Stress echocardiogramFeb 2019was normal.CTA June 2019 showed aneurysm of the membranous ventricular septum that bulges into the right ventricular outflow tract. There is no connection between the left ventricle and right ventricle. There is no sinus of Valsalva aneurysm. Calcium score 0. No coronary disease. ABIs August 2020 showed noncompressible left lower extremity artery and normal right.  Since last seen the patient denies dyspnea, chest pain, palpitations or syncope.  Current Outpatient Medications  Medication Sig Dispense Refill  . 5-Hydroxytryptophan (5-HTP) 50 MG CAPS Take 100 mg by mouth daily.     . Alpha-Lipoic Acid 600 MG TABS Take 600 mg by mouth daily.    . AMINO ACIDS COMPLEX PO Take 1 Scoop by mouth daily.     Francia Greaves THYROID 30 MG tablet Take 30 mg by mouth daily.  6  . Ascorbic Acid (VITAMIN C PO) Take 2,500 Units by mouth daily.     . B Complex Vitamins (VITAMIN B COMPLEX PO) Take 1 tablet by mouth daily.    . Cholecalciferol (VITAMIN D3) 10000 units capsule Take 10,000 Units by mouth daily.     . Cyanocobalamin (VITAMIN B-12 ER PO) Take 5,000 mcg by mouth daily.     . Digestive Enzymes (DIGESTIVE ENZYME PO) Take 1 tablet by mouth daily.    [name] GLUTATHIONE PO Take 250 mg by mouth daily.    . magic mouthwash w/lidocaine SOLN Take 5-10 mLs by mouth 3 (three) times daily as needed for mouth pain. 480 mL 1  . MAGNESIUM CITRATE PO Take 800 mg by mouth daily.     . Menaquinone-7 (VITAMIN K2 PO) Take 1 tablet by  mouth daily.    . Omega-3 Fatty Acids (FISH OIL) 1000 MG CAPS Take 1,000 mg by mouth daily.    [name] thyroid (ARMOUR THYROID) 15 MG tablet Take 15 mg by mouth daily. Take with 30 mg tab 2 times weekly for total of 45 mg    . zinc gluconate 50 MG tablet Take 50 mg by mouth daily.     Current Facility-Administered Medications  Medication Dose Route Frequency Provider Last Rate Last Dose  . 0.9 %  sodium chloride infusion  500 mL Intravenous Once Pyrtle, [name], MD         Past Medical History:  Diagnosis Date  . Allergy   . Aneurysm of cardiac wall, congenital    a.  septal aneurysm extending into the RVOT (by cardiac MRI in 08/2013)  . Anxiety   . Arthritis   . Asthma   . Depression   . Heart murmur   . Hypothyroidism   . Lyme disease   . MVP (mitral valve prolapse)   . Orthostatic hypotension   . Osteoporosis   . Peripheral neuropathy 06/09/2012  . Plantar fasciitis   . Pneumothorax     Past Surgical History:  Procedure Laterality Date  . COLONOSCOPY WITH PROPOFOL N/A 02/22/2013   Procedure: COLONOSCOPY WITH PROPOFOL;  Surgeon: Garlan Fair, MD;  Location: [facility];  Service: Endoscopy;  Laterality: N/A;  . MOUTH SURGERY  01/2012   bone graft in mouth  .  NASAL SINUS SURGERY Right 06/12/2014   Procedure: RIGHT ENDOSCOPIC MAXILLARY ANTROSTOMY;  Surgeon: Ascencion Dike, MD;  Location: [facility];  Service: ENT;  Laterality: Right;  . ROOT CANAL  02/27/2012  . TONSILLECTOMY  1962    Social History   Socioeconomic History  . Marital status: Divorced    Spouse name: Not on file  . Number of children: 0  . Years of education: Not on file  . Highest education level: Not on file  Occupational History    Comment: Career and life coach  Social Needs  . Financial resource strain: Not on file  . Food insecurity    Worry: Not on file    Inability: Not on file  . Transportation needs    Medical: Not on file    Non-medical: Not on file  Tobacco Use  . Smoking  status: Never Smoker  . Smokeless tobacco: Never Used  Substance and Sexual Activity  . Alcohol use: No    Alcohol/week: 0.0 standard drinks  . Drug use: No  . Sexual activity: Not on file  Lifestyle  . Physical activity    Days per week: Not on file    Minutes per session: Not on file  . Stress: Not on file  Relationships  . Social Herbalist on phone: Not on file    Gets together: Not on file    Attends religious service: Not on file    Active member of club or organization: Not on file    Attends meetings of clubs or organizations: Not on file    Relationship status: Not on file  . Intimate partner violence    Fear of current or ex partner: Not on file    Emotionally abused: Not on file    Physically abused: Not on file    Forced sexual activity: Not on file  Other Topics Concern  . Not on file  Social History Narrative   Patient is single and lives alone.   Patient is self-employed, career and life coaching.   Patient drinks two to four cups of caffeine daily.    Family History  Problem Relation Age of Onset  . Hyperlipidemia Father   . Heart failure Father   . Prostate cancer Father   . Kidney failure Father   . Hypertension Father   . Angina Mother   . Hypertension Mother   . Lung cancer Mother   . Colon cancer Mother   . Lung cancer Paternal Grandfather   . Breast cancer Paternal Grandmother   . Diabetes Paternal Grandmother   . Esophageal cancer Neg Hx   . Liver cancer Neg Hx   . Pancreatic cancer Neg Hx   . Rectal cancer Neg Hx   . Stomach cancer Neg Hx     ROS: no fevers or chills, productive cough, hemoptysis, dysphasia, odynophagia, melena, hematochezia, dysuria, hematuria, rash, seizure activity, orthopnea, PND, pedal edema, claudication. Remaining systems are negative.  Physical Exam: Well-developed well-nourished in no acute distress.  Skin is warm and dry.  HEENT is normal.  Neck is supple.  Chest is clear to auscultation with  normal expansion.  Cardiovascular exam is regular rate and rhythm.  Abdominal exam nontender or distended. No masses palpated. Extremities show no edema. neuro grossly intact  ECG-sinus rhythm at a rate of 67, RV conduction delay, no significant ST changes.  Personally reviewed  A/P  1 aneurysmal dilatation of the interventricular septum extending into the right ventricular  outflow tract-as outlined previously her evaluation showed no ventricular septal defect and no sinus of Valsalva aneurysm.  2 orthostatic hypotension-symptoms well controlled.  Continue increased fluid intake intake.  3 chest pain-no recurrent symptoms.  Previous calcium score 0.  Symptoms atypical.  We will not pursue further ischemia evaluation at this time.  4 hyperlipidemia-recent lipids show LDL 130 and HDL 99.  She is not diabetic and has no vascular disease.  No other risk factors.  Continue diet.  Recheck lipids in 6 months.  Kirk Ruths, MD

## 2019-06-29 ENCOUNTER — Encounter: Payer: Self-pay | Admitting: Cardiology

## 2019-06-29 ENCOUNTER — Encounter: Payer: Self-pay | Admitting: *Deleted

## 2019-06-29 ENCOUNTER — Ambulatory Visit (INDEPENDENT_AMBULATORY_CARE_PROVIDER_SITE_OTHER): Payer: BC Managed Care – PPO | Admitting: Cardiology

## 2019-06-29 ENCOUNTER — Other Ambulatory Visit: Payer: Self-pay

## 2019-06-29 VITALS — BP 102/68 | HR 67 | Ht 69.5 in | Wt 127.0 lb

## 2019-06-29 DIAGNOSIS — I951 Orthostatic hypotension: Secondary | ICD-10-CM

## 2019-06-29 DIAGNOSIS — E7800 Pure hypercholesterolemia, unspecified: Secondary | ICD-10-CM

## 2019-06-29 DIAGNOSIS — R072 Precordial pain: Secondary | ICD-10-CM

## 2019-06-29 NOTE — Patient Instructions (Signed)
Medication Instructions:  NO CHANGE *If you need a refill on your cardiac medications before your next appointment, please call your pharmacy*  Lab Work: Your physician recommends that you HAVE LAB WORK IN 6 MONTHS PRIOR TO EATING  If you have labs (blood work) drawn today and your tests are completely normal, you will receive your results only by: [name] MyChart Message (if you have MyChart) OR . A paper copy in the mail If you have any lab test that is abnormal or we need to change your treatment, we will call you to review the results.  Follow-Up: At [facility], you and your health needs are our priority.  As part of our continuing mission to provide you with exceptional heart care, we have created designated Provider Care Teams.  These Care Teams include your primary Cardiologist (physician) and Advanced Practice Providers (APPs -  Physician Assistants and Nurse Practitioners) who all work together to provide you with the care you need, when you need it.  Your next appointment:   6 months  The format for your next appointment:   In Person  Provider:   Kirk Ruths, MD

## 2019-07-04 ENCOUNTER — Telehealth: Payer: Self-pay | Admitting: Hematology

## 2019-07-04 ENCOUNTER — Encounter: Payer: Self-pay | Admitting: Internal Medicine

## 2019-07-04 ENCOUNTER — Ambulatory Visit (INDEPENDENT_AMBULATORY_CARE_PROVIDER_SITE_OTHER): Payer: BC Managed Care – PPO | Admitting: Internal Medicine

## 2019-07-04 ENCOUNTER — Encounter: Payer: Self-pay | Admitting: Hematology

## 2019-07-04 VITALS — BP 116/62 | HR 73 | Temp 97.4°F | Ht 69.5 in | Wt 129.0 lb

## 2019-07-04 DIAGNOSIS — R194 Change in bowel habit: Secondary | ICD-10-CM

## 2019-07-04 DIAGNOSIS — D510 Vitamin B12 deficiency anemia due to intrinsic factor deficiency: Secondary | ICD-10-CM | POA: Diagnosis not present

## 2019-07-04 DIAGNOSIS — K5909 Other constipation: Secondary | ICD-10-CM

## 2019-07-04 NOTE — Progress Notes (Signed)
Subjective:    Patient ID: Megan [name], female    DOB: 07/28/1956, 63 y.o.   MRN: [id number]  HPI Megan [name] is a 63 year old female with a past medical history of chronic constipation/colonic inertia, family history of colon cancer in her mother, family history of colon polyps in her father, hypothyroidism, osteoporosis diagnosis of pernicious anemia by antiparietal cell antibody positivity who is seen in follow-up.  She is here alone today.  I last saw her at the time of her screening colonoscopy performed on 03/04/2018.  This colonoscopy revealed a tortuous colon with looping but the exam was complete with a good prep to the cecum.  Entire exam was normal.  She reports that she has had 2 episodes this year since June of urgent more loose stools which is the opposite of what she is normally accustomed to.  For about 3 weeks during June and September 2020 she developed loose stools occurring once daily in the morning.  She described stools as mushy and not pure watery like diarrhea.  On one occasion she had spontaneous loose stool which she was unable to control.  Both of these times she felt flulike symptoms with headache and generalized aching.  She had COVID-19 testing both times which was negative.  She also stopped her normal regimen of magnesium and vitamin C and still had the loose stools on a daily basis.  During her most recent episode she took Alka-Seltzer with some relief.  Again her usual is constipation which has been longstanding.  Normally without vitamin C and magnesium 800 mg daily she will not have regular bowel movements.  She also takes an L-glutamine supplement.  Most recently she has returned to a more normal pattern and for the last 4 days she has not had a bowel movement.  She has resumed vitamin C and magnesium therapy.  She is not having abdominal pain though she did have some crampy lower abdominal pain in the episodes described above.  Last night she felt lower  abdominal pressure like she would have a bowel movement but never did.  She has not seen blood in her stool or melena.  Through her holistic doctor she was diagnosed with pernicious anemia and had a positive parietal cell antibody.  Her intrinsic factor Ab was also positive.  She had been taking high-dose B12 sublingual and was told that she should consider iron therapy.  She reports her last B12 level was normal and on the high end at 1200.  She also had her iron studies checked and had a borderline low ferritin around 19 and a borderline TIBC which was near the upper limit of normal.  Her percent iron saturation was normal.  She sees Dr. Irene Limbo with hematology.  She is considering IVF  Review of Systems As per HPI, otherwise negative  Current Medications, Allergies, Past Medical History, Past Surgical History, Family History and Social History were reviewed in [employer] record.     Objective:   Physical Exam BP 116/62    Pulse 73    Temp (!) 97.4 F (36.3 C) (Temporal)    Ht 5' 9.5" (1.765 m)    Wt 129 lb (58.5 kg)    BMI 18.78 kg/m  Gen: awake, alert, NAD HEENT: anicteric, op clear CV: RRR, no mrg Pulm: CTA b/l Abd: soft, NT/ND, +BS throughout Ext: no c/c/e Neuro: nonfocal  CBC    Component Value Date/Time   WBC 3.1 (L) 05/24/2019 1431   RBC  4.32 05/24/2019 1431   HGB 13.0 05/24/2019 1431   HGB 13.7 03/15/2013 1144   HCT 39.9 05/24/2019 1431   HCT 40.8 03/15/2013 1144   PLT 202 05/24/2019 1431   PLT 210 03/15/2013 1144   MCV 92.4 05/24/2019 1431   MCV 88.9 03/15/2013 1144   MCH 30.1 05/24/2019 1431   MCHC 32.6 05/24/2019 1431   RDW 12.8 05/24/2019 1431   RDW 13.8 03/15/2013 1144   LYMPHSABS 1.2 05/24/2019 1431   LYMPHSABS 1.0 03/15/2013 1144   MONOABS 0.3 05/24/2019 1431   MONOABS 0.3 03/15/2013 1144   EOSABS 0.0 05/24/2019 1431   EOSABS 0.0 03/15/2013 1144   BASOSABS 0.0 05/24/2019 1431   BASOSABS 0.1 03/15/2013 1144   Iron/TIBC/Ferritin/  %Sat    Component Value Date/Time   IRON 164 (H) 03/03/2019 1432   TIBC 439 03/03/2019 1432   FERRITIN 19 05/24/2019 1431   IRONPCTSAT 37 03/03/2019 1432   CMP     Component Value Date/Time   NA 143 03/03/2019 1432   NA 143 01/19/2018 0941   NA 140 05/24/2012 1053   K 3.7 03/03/2019 1432   K 4.1 05/24/2012 1053   CL 104 03/03/2019 1432   CL 106 05/24/2012 1053   CO2 28 03/03/2019 1432   CO2 27 05/24/2012 1053   GLUCOSE 71 03/03/2019 1432   GLUCOSE 72 05/24/2012 1053   BUN 9 03/03/2019 1432   BUN 11 01/19/2018 0941   BUN 13.0 05/24/2012 1053   CREATININE 0.79 03/03/2019 1432   CREATININE 0.60 11/04/2017 0918   CREATININE 0.7 05/24/2012 1053   CALCIUM 9.7 03/03/2019 1432   CALCIUM 9.5 05/24/2012 1053   PROT 7.5 03/03/2019 1432   PROT CANCELED 01/19/2018 0941   PROT 6.6 05/24/2012 1053   ALBUMIN 4.6 03/03/2019 1432   ALBUMIN 4.0 05/24/2012 1053   AST 50 (H) 03/03/2019 1432   AST 29 05/24/2012 1053   ALT 31 03/03/2019 1432   ALT 13 05/24/2012 1053   ALKPHOS 86 03/03/2019 1432   ALKPHOS 65 05/24/2012 1053   BILITOT 1.0 03/03/2019 1432   BILITOT 0.50 05/24/2012 1053   GFRNONAA >60 03/03/2019 1432   GFRNONAA 98 11/04/2017 0918   GFRAA >60 03/03/2019 1432   GFRAA 113 11/04/2017 0918        Assessment & Plan:  63 year old female with a past medical history of chronic constipation/colonic inertia, family history of colon cancer in her mother, family history of colon polyps in her father, hypothyroidism, osteoporosis diagnosis of pernicious anemia by antiparietal cell antibody positivity who is seen in follow-up.  1.  Episodic loose stool/change in bowel habit --she had 2 episodes lasting 3 weeks of loose mushy stool.  Not definitively diarrhea.  Difficult to know what led to these 3-week long events.  Her infectious panel/GI pathogen panel was negative.  She tested negative for Covid.  Certainly is the opposite of her normal constipation/colonic inertia.  Cannot exclude a  viral or infectious cause.  If this recurs I recommend the following: --Fecal elastase and fecal calprotectin  2.  Chronic constipation/colonic inertia --most recently she is back to more constipated state and has not had a bowel movement in 4 days.  She is taking vitamin C and magnesium supplementation.  I recommended she consider a daily laxative like senna or MiraLAX given that overflow diarrhea/loose stool can occur in patients with chronic constipation. --Continue magnesium and vitamin C supplementation --Has not had a favorable response to prior probiotic (worsened abdominal bloating and cramping) --Consider  adding MiraLAX 17 g daily or senna 1 tablet  3.  Pernicious anemia --confirmed with antiparietal and antiintrinsic factor antibodies.  She is able to overcome this issue and keep a normal B12 with sublingual B12.  With normal serum B12 I do not think IM B12 is needed.  That said, IM B12 is very safe. --Either way she will need B12 supplementation either IM or sublingual; monitor B12 to ensure serum levels remain normal --We will discuss this issue again at follow-up.  Associated conditions include celiac disease, pancreatic insufficiency, gastritis, hyper gastrin states and IBD.  As an aside of loose stools/change in bowel habits return then would consider working up further for pancreatic insufficiency, excluding celiac disease, may be IDA with video capsule  4.  Family history of colon cancer --5-year screening interval with repeat exam recommended July 2024  She will follow-up in late December  30 minutes spent with the patient today. Greater than 50% was spent in counseling and coordination of care with the patient

## 2019-07-04 NOTE — Telephone Encounter (Signed)
Scheduled appt per 11/9 sch message  - unable to reach pt . Left message with appt date and time .  

## 2019-07-04 NOTE — Patient Instructions (Addendum)
If you are age 64 or older, your body mass index should be between 23-30. Your Body mass index is 18.78 kg/m. If this is out of the aforementioned range listed, please consider follow up with your Primary Care Provider.  If you are age 77 or younger, your body mass index should be between 19-25. Your Body mass index is 18.78 kg/m. If this is out of the aformentioned range listed, please consider follow up with your Primary Care Provider.   You will be due for a recall colonoscopy in 02/2023. We will send you a reminder in the mail when it gets closer to that time.  If the constipation persists please start with Senna or Miralax 17g per day. Both of these medications are over the counter at any pharmacy.  Please contact the office to schedule an appointment in 6-8 weeks.  Thank you for choosing me and [facility]  Zenovia Jarred, MD

## 2019-07-05 ENCOUNTER — Other Ambulatory Visit (INDEPENDENT_AMBULATORY_CARE_PROVIDER_SITE_OTHER): Payer: BC Managed Care – PPO

## 2019-07-05 ENCOUNTER — Telehealth: Payer: Self-pay | Admitting: Hematology

## 2019-07-05 DIAGNOSIS — I341 Nonrheumatic mitral (valve) prolapse: Secondary | ICD-10-CM

## 2019-07-05 DIAGNOSIS — R072 Precordial pain: Secondary | ICD-10-CM | POA: Diagnosis not present

## 2019-07-05 DIAGNOSIS — E7800 Pure hypercholesterolemia, unspecified: Secondary | ICD-10-CM

## 2019-07-05 DIAGNOSIS — R002 Palpitations: Secondary | ICD-10-CM

## 2019-07-05 DIAGNOSIS — I83893 Varicose veins of bilateral lower extremities with other complications: Secondary | ICD-10-CM | POA: Diagnosis not present

## 2019-07-05 DIAGNOSIS — I739 Peripheral vascular disease, unspecified: Secondary | ICD-10-CM

## 2019-07-05 DIAGNOSIS — I951 Orthostatic hypotension: Secondary | ICD-10-CM

## 2019-07-05 DIAGNOSIS — I253 Aneurysm of heart: Secondary | ICD-10-CM

## 2019-07-05 DIAGNOSIS — I781 Nevus, non-neoplastic: Secondary | ICD-10-CM

## 2019-07-05 DIAGNOSIS — F4322 Adjustment disorder with anxiety: Secondary | ICD-10-CM | POA: Diagnosis not present

## 2019-07-05 NOTE — Telephone Encounter (Signed)
Returned patient's phone call regarding rescheduling 11/13 appointment, per patient's request appointment has moved to 11/24.  Message to provider.

## 2019-07-08 ENCOUNTER — Ambulatory Visit: Payer: BC Managed Care – PPO

## 2019-07-11 DIAGNOSIS — N762 Acute vulvitis: Secondary | ICD-10-CM | POA: Diagnosis not present

## 2019-07-12 DIAGNOSIS — F4322 Adjustment disorder with anxiety: Secondary | ICD-10-CM | POA: Diagnosis not present

## 2019-07-13 DIAGNOSIS — N952 Postmenopausal atrophic vaginitis: Secondary | ICD-10-CM | POA: Diagnosis not present

## 2019-07-13 DIAGNOSIS — R300 Dysuria: Secondary | ICD-10-CM | POA: Diagnosis not present

## 2019-07-14 ENCOUNTER — Other Ambulatory Visit: Payer: Self-pay | Admitting: Hematology

## 2019-07-14 DIAGNOSIS — G959 Disease of spinal cord, unspecified: Secondary | ICD-10-CM | POA: Diagnosis not present

## 2019-07-14 DIAGNOSIS — D509 Iron deficiency anemia, unspecified: Secondary | ICD-10-CM | POA: Insufficient documentation

## 2019-07-14 DIAGNOSIS — D508 Other iron deficiency anemias: Secondary | ICD-10-CM

## 2019-07-15 DIAGNOSIS — B9689 Other specified bacterial agents as the cause of diseases classified elsewhere: Secondary | ICD-10-CM | POA: Diagnosis not present

## 2019-07-15 DIAGNOSIS — Z85828 Personal history of other malignant neoplasm of skin: Secondary | ICD-10-CM | POA: Diagnosis not present

## 2019-07-15 DIAGNOSIS — L649 Androgenic alopecia, unspecified: Secondary | ICD-10-CM | POA: Diagnosis not present

## 2019-07-15 DIAGNOSIS — L820 Inflamed seborrheic keratosis: Secondary | ICD-10-CM | POA: Diagnosis not present

## 2019-07-15 DIAGNOSIS — L570 Actinic keratosis: Secondary | ICD-10-CM | POA: Diagnosis not present

## 2019-07-15 DIAGNOSIS — B078 Other viral warts: Secondary | ICD-10-CM | POA: Diagnosis not present

## 2019-07-15 DIAGNOSIS — L821 Other seborrheic keratosis: Secondary | ICD-10-CM | POA: Diagnosis not present

## 2019-07-16 ENCOUNTER — Emergency Department (HOSPITAL_BASED_OUTPATIENT_CLINIC_OR_DEPARTMENT_OTHER)
Admission: EM | Admit: 2019-07-16 | Discharge: 2019-07-16 | Disposition: A | Discharge status: Home / Self Care | Payer: BC Managed Care – PPO | Attending: Emergency Medicine | Admitting: Emergency Medicine

## 2019-07-16 ENCOUNTER — Other Ambulatory Visit: Payer: Self-pay

## 2019-07-16 ENCOUNTER — Encounter (HOSPITAL_BASED_OUTPATIENT_CLINIC_OR_DEPARTMENT_OTHER): Payer: Self-pay | Admitting: Emergency Medicine

## 2019-07-16 DIAGNOSIS — J45909 Unspecified asthma, uncomplicated: Secondary | ICD-10-CM | POA: Diagnosis not present

## 2019-07-16 DIAGNOSIS — N760 Acute vaginitis: Secondary | ICD-10-CM | POA: Insufficient documentation

## 2019-07-16 DIAGNOSIS — Z881 Allergy status to other antibiotic agents status: Secondary | ICD-10-CM | POA: Diagnosis not present

## 2019-07-16 DIAGNOSIS — Z880 Allergy status to penicillin: Secondary | ICD-10-CM | POA: Diagnosis not present

## 2019-07-16 DIAGNOSIS — Z79899 Other long term (current) drug therapy: Secondary | ICD-10-CM | POA: Diagnosis not present

## 2019-07-16 DIAGNOSIS — R102 Pelvic and perineal pain: Secondary | ICD-10-CM

## 2019-07-16 DIAGNOSIS — E039 Hypothyroidism, unspecified: Secondary | ICD-10-CM | POA: Insufficient documentation

## 2019-07-16 DIAGNOSIS — R011 Cardiac murmur, unspecified: Secondary | ICD-10-CM | POA: Insufficient documentation

## 2019-07-16 DIAGNOSIS — B9689 Other specified bacterial agents as the cause of diseases classified elsewhere: Secondary | ICD-10-CM | POA: Diagnosis not present

## 2019-07-16 DIAGNOSIS — N899 Noninflammatory disorder of vagina, unspecified: Secondary | ICD-10-CM | POA: Diagnosis not present

## 2019-07-16 LAB — CBC WITH DIFFERENTIAL/PLATELET
Abs Immature Granulocytes: 0 10*3/uL (ref 0.00–0.07)
Basophils Absolute: 0 10*3/uL (ref 0.0–0.1)
Basophils Relative: 1 %
Eosinophils Absolute: 0 10*3/uL (ref 0.0–0.5)
Eosinophils Relative: 1 %
HCT: 43.8 % (ref 36.0–46.0)
Hemoglobin: 14 g/dL (ref 12.0–15.0)
Immature Granulocytes: 0 %
Lymphocytes Relative: 31 %
Lymphs Abs: 1.1 10*3/uL (ref 0.7–4.0)
MCH: 29.9 pg (ref 26.0–34.0)
MCHC: 32 g/dL (ref 30.0–36.0)
MCV: 93.6 fL (ref 80.0–100.0)
Monocytes Absolute: 0.3 10*3/uL (ref 0.1–1.0)
Monocytes Relative: 8 %
Neutro Abs: 2 10*3/uL (ref 1.7–7.7)
Neutrophils Relative %: 59 %
Platelets: 228 10*3/uL (ref 150–400)
RBC: 4.68 MIL/uL (ref 3.87–5.11)
RDW: 12.3 % (ref 11.5–15.5)
WBC: 3.3 10*3/uL — ABNORMAL LOW (ref 4.0–10.5)
nRBC: 0 % (ref 0.0–0.2)

## 2019-07-16 LAB — URINALYSIS, ROUTINE W REFLEX MICROSCOPIC
Bilirubin Urine: NEGATIVE
Glucose, UA: NEGATIVE mg/dL
Hgb urine dipstick: NEGATIVE
Ketones, ur: NEGATIVE mg/dL
Leukocytes,Ua: NEGATIVE
Nitrite: NEGATIVE
Protein, ur: NEGATIVE mg/dL
Specific Gravity, Urine: 1.015 (ref 1.005–1.030)
pH: 7.5 (ref 5.0–8.0)

## 2019-07-16 LAB — LIPASE, BLOOD: Lipase: 54 U/L — ABNORMAL HIGH (ref 11–51)

## 2019-07-16 LAB — BASIC METABOLIC PANEL
Anion gap: 8 (ref 5–15)
BUN: 11 mg/dL (ref 8–23)
CO2: 27 mmol/L (ref 22–32)
Calcium: 9.5 mg/dL (ref 8.9–10.3)
Chloride: 105 mmol/L (ref 98–111)
Creatinine, Ser: 0.68 mg/dL (ref 0.44–1.00)
GFR calc Af Amer: >60 mL/min (ref >60)
GFR calc non Af Amer: >60 mL/min (ref >60)
Glucose, Bld: 79 mg/dL (ref 70–99)
Potassium: 3.6 mmol/L (ref 3.5–5.1)
Sodium: 140 mmol/L (ref 135–145)

## 2019-07-16 LAB — WET PREP, GENITAL
Sperm: NONE SEEN
Trich, Wet Prep: NONE SEEN
Yeast Wet Prep HPF POC: NONE SEEN

## 2019-07-16 MED ORDER — IBUPROFEN 400 MG PO TABS
400.0000 mg | ORAL_TABLET | Freq: Once | ORAL | Status: AC
  Administered 2019-07-16: 400 mg via ORAL
  Filled 2019-07-16: qty 1

## 2019-07-16 MED ORDER — ONDANSETRON HCL 4 MG PO TABS: 4.0000 mg | ORAL_TABLET | Freq: Four times a day (QID) | ORAL | 0 refills | Status: DC

## 2019-07-16 MED ORDER — METRONIDAZOLE 500 MG PO TABS: 500.0000 mg | ORAL_TABLET | Freq: Two times a day (BID) | ORAL | 0 refills | Status: DC

## 2019-07-16 MED ORDER — ONDANSETRON 4 MG PO TBDP
4.0000 mg | ORAL_TABLET | Freq: Once | ORAL | Status: AC
  Administered 2019-07-16: 4 mg via ORAL
  Filled 2019-07-16: qty 1

## 2019-07-16 NOTE — Discharge Instructions (Addendum)
Please follow up with your OB/GYN. I have prescribed zofran for you to use for nausea as needed.   There is evidence on your swabs today that you have bacterial vaginosis.  Please take the antibiotics I prescribed you.  Please use a over-the-counter probiotic as this will decrease likelihood of you getting a vaginal yeast infection.

## 2019-07-16 NOTE — ED Notes (Signed)
ED Provider at bedside. 

## 2019-07-16 NOTE — ED Provider Notes (Signed)
[facility] Provider Note   CSN: [id number] Arrival date & time: 07/16/19  1227     History   Chief Complaint Chief Complaint  Patient presents with  . Pelvic Pain    HPI Megan [name] is a 63 y.o. female      HPI  Patient presents with central pelvic pain that began Monday and is achy, constant and 4/10.  Patient states that symptoms began Monday with some moderate pelvic pain.  Patient states that she had an ingrown hair in her left inguinal crease at the time.  States she went to her family medicine doctor who gave her a total antibiotic to put on the ingrown hair.  Patient states that the ingrown hair improved but she continued to have pelvic pain, was reassessed Wednesday and given treatment for UTI and yeast infection.  Patient states that she continue to have belly pain that worsened Friday and began radiating to her back.  States that she has been peeing more frequently and endorses nausea.  Denies any diarrhea constipation pain with defecation, fatigue, fever, body aches.  States she is not sexually active.    Patient states that several months ago she was evaluated with MR of her pelvis because she had elevated estrogen levels.  States that results were normal and that repeat blood work showed improvement.  Denies any vaginal pain, vaginal discharge, burning with urination, abdominal pain   Past Medical History:  Diagnosis Date  . Allergic rhinitis   . Allergy   . Aneurysm of cardiac wall, congenital    a.  septal aneurysm extending into the RVOT (by cardiac MRI in 08/2013)  . Anxiety   . Arthritis   . Asthma   . Basal cell carcinoma   . Depression   . Heart murmur   . Hypothyroidism   . Leukopenia    idiopathic  . Lyme disease   . Mitral valve prolapse   . MVP (mitral valve prolapse)   . Orthostatic hypotension   . Osteoporosis   . Peripheral neuropathy 06/09/2012  . Pernicious anemia   . Plantar fasciitis   .  Pneumothorax     Patient Active Problem List   Diagnosis Date Noted  . Iron deficiency anemia 07/14/2019  . Vitamin D deficiency, unspecified 07/30/2018  . Age-related osteoporosis without current pathological fracture 07/30/2018  . Estrogen excess 06/18/2018  . Hypogammaglobulinemia ([place]) 01/11/2018  . Right hip pain 12/30/2017  . Bilateral ankle pain 11/09/2017  . Hypothyroidism 10/27/2017  . Hair loss 04/25/2017  . Neuropathic pain of left lower extremity 04/22/2017  . Great toe pain, left 04/10/2017  . Precordial chest pain 12/17/2016  . Aneurysm of right ventricle of heart 12/17/2016  . Chronic fatigue 03/05/2016  . Neuropathy associated with MGUS ([place]) 12/26/2013  . Orthostatic hypotension 08/01/2013  . Mitral valve prolapse 08/01/2013  . Spider veins of limb 02/15/2013  . Leukopenia 05/13/2012  . Bilateral Dorsal Foot pain 04/23/2011  . Gait abnormality 04/23/2011  . Leg length inequality 04/23/2011  . ASTHMA 11/16/2008  . Nonspecific (abnormal) findings on radiological and other examination of body structure 07/24/2008  . ABNORMAL CHEST XRAY 07/24/2008    Past Surgical History:  Procedure Laterality Date  . COLONOSCOPY WITH PROPOFOL N/A 02/22/2013   Procedure: COLONOSCOPY WITH PROPOFOL;  Surgeon: Garlan Fair, MD;  Location: [facility];  Service: Endoscopy;  Laterality: N/A;  . MOUTH SURGERY  01/2012   bone graft in mouth  . NASAL SINUS SURGERY Right  06/12/2014   Procedure: RIGHT ENDOSCOPIC MAXILLARY ANTROSTOMY;  Surgeon: Ascencion Dike, MD;  Location: [facility];  Service: ENT;  Laterality: Right;  . ROOT CANAL  02/27/2012  . TONSILLECTOMY  1962     OB History   No obstetric history on file.      Home Medications    Prior to Admission medications   Medication Sig Start Date End Date Taking? Authorizing Provider  5-Hydroxytryptophan (5-HTP) 50 MG CAPS Take 100 mg by mouth daily.     [provider]  Alpha-Lipoic Acid 600 MG TABS  Take 600 mg by mouth daily.    [provider]  AMINO ACIDS COMPLEX PO Take 1 Scoop by mouth daily.     [provider]  ARMOUR THYROID 30 MG tablet Take 30 mg by mouth daily. 11/20/16   [provider]  Ascorbic Acid (VITAMIN C PO) Take 2,500 Units by mouth daily.     [provider]  B Complex Vitamins (VITAMIN B COMPLEX PO) Take 1 tablet by mouth daily.    [provider]  Cholecalciferol (VITAMIN D3) 10000 units capsule Take 10,000 Units by mouth daily.     [provider]  Cyanocobalamin (VITAMIN B-12 ER PO) Take 5,000 mcg by mouth daily.     [provider]  Digestive Enzymes (DIGESTIVE ENZYME PO) Take 1 tablet by mouth daily.    [provider]  GLUTATHIONE PO Take 250 mg by mouth daily.    [provider]  magic mouthwash w/lidocaine SOLN Take 5-10 mLs by mouth 3 (three) times daily as needed for mouth pain. 05/24/19   Brunetta Genera, MD  MAGNESIUM CITRATE PO Take 800 mg by mouth daily.     [provider]  Menaquinone-7 (VITAMIN K2 PO) Take 1 tablet by mouth daily.    [provider]  metroNIDAZOLE (FLAGYL) 500 MG tablet Take 1 tablet (500 mg total) by mouth 2 (two) times daily. 07/16/19   Tedd Sias, PA  Omega-3 Fatty Acids (FISH OIL) 1000 MG CAPS Take 1,000 mg by mouth daily.    [provider]  ondansetron (ZOFRAN) 4 MG tablet Take 1 tablet (4 mg total) by mouth every 6 (six) hours. 07/16/19   Tedd Sias, PA  thyroid (ARMOUR THYROID) 15 MG tablet Take 15 mg by mouth daily. Take with 30 mg tab 2 times weekly for total of 45 mg    [provider]  zinc gluconate 50 MG tablet Take 50 mg by mouth daily.    [provider]    Family History Family History  Problem Relation Age of Onset  . Hyperlipidemia Father   . Heart failure Father   . Prostate cancer Father   . Kidney failure Father   . Hypertension Father   . Angina Mother   .  Hypertension Mother   . Lung cancer Mother   . Colon cancer Mother   . Lung cancer Paternal Grandfather   . Breast cancer Paternal Grandmother   . Diabetes Paternal Grandmother   . Esophageal cancer Neg Hx   . Liver cancer Neg Hx   . Pancreatic cancer Neg Hx   . Rectal cancer Neg Hx   . Stomach cancer Neg Hx     Social History Social History   Tobacco Use  . Smoking status: Never Smoker  . Smokeless tobacco: Never Used  Substance Use Topics  . Alcohol use: No    Alcohol/week: 0.0 standard drinks  .  Drug use: No     Allergies   Augmentin [amoxicillin-pot clavulanate] and Clindamycin/lincomycin   Review of Systems Review of Systems  Constitutional: Negative for chills, fatigue and fever.  HENT: Negative for congestion.   Respiratory: Negative for cough.   Cardiovascular: Negative for chest pain.  Genitourinary: Positive for frequency and pelvic pain. Negative for dysuria, flank pain, hematuria, menstrual problem, vaginal bleeding, vaginal discharge and vaginal pain.  Musculoskeletal: Positive for back pain.  Skin: Negative for rash and wound.  Neurological: Negative for dizziness.     Physical Exam Updated Vital Signs BP 121/78 (BP Location: Left Arm)   Pulse (!) 59   Temp 98.9 F (37.2 C) (Oral)   Resp 18   Ht 5' 9.25" (1.759 m)   Wt 59 kg   SpO2 99%   BMI 19.06 kg/m   Physical Exam Vitals signs and nursing note reviewed.  Constitutional:      General: She is not in acute distress. HENT:     Head: Normocephalic and atraumatic.     Nose: Nose normal.  Eyes:     General: No scleral icterus. Neck:     Musculoskeletal: Normal range of motion.  Cardiovascular:     Rate and Rhythm: Normal rate and regular rhythm.     Pulses: Normal pulses.     Heart sounds: Normal heart sounds.  Pulmonary:     Effort: Pulmonary effort is normal. No respiratory distress.     Breath sounds: No wheezing.  Abdominal:     General: Abdomen is flat. Bowel sounds are  normal.     Palpations: Abdomen is soft.     Tenderness: There is no abdominal tenderness.  Genitourinary:    Comments: Vulva without lesions or abnormality Vaginal canal without abnormal discharge or lesions Some thinning of the vaginal mucosa  Cervix appears normal, is closed No adnexal tenderness or CMT Musculoskeletal:     Right lower leg: No edema.     Left lower leg: No edema.  Skin:    General: Skin is warm and dry.     Capillary Refill: Capillary refill takes less than 2 seconds.  Neurological:     Mental Status: She is alert. Mental status is at baseline.  Psychiatric:        Mood and Affect: Mood normal.        Behavior: Behavior normal.      ED Treatments / Results  Labs (all labs ordered are listed, but only abnormal results are displayed) Labs Reviewed  WET PREP, GENITAL - Abnormal; Notable for the following components:      Result Value   Clue Cells Wet Prep HPF POC PRESENT (*)    WBC, Wet Prep HPF POC MANY (*)    All other components within normal limits  CBC WITH DIFFERENTIAL/PLATELET - Abnormal; Notable for the following components:   WBC 3.3 (*)    All other components within normal limits  LIPASE, BLOOD - Abnormal; Notable for the following components:   Lipase 54 (*)    All other components within normal limits  URINE CULTURE  URINALYSIS, ROUTINE W REFLEX MICROSCOPIC  BASIC METABOLIC PANEL    EKG None  Radiology No results found.  Procedures Procedures (including critical care time)  Medications Ordered in ED Medications  ondansetron (ZOFRAN-ODT) disintegrating tablet 4 mg (4 mg Oral Given 07/16/19 1348)  ibuprofen (ADVIL) tablet 400 mg (400 mg Oral Given 07/16/19 1348)     Initial Impression / Assessment and Plan / ED Course  I have reviewed the triage vital signs and the nursing notes.  Pertinent labs & imaging results that were available during my care of the patient were reviewed by me and considered in my medical decision making  (see chart for details).        Patient is 63 year old female presenting today with pelvic pain.  Patient with benign physical exam.  No CMT or adnexal tenderness.  Urinalysis, CBC, lipase, BMP all without abnormal findings.  Patient concern for early tract infection will obtain culture.  Wet prep shows evidence of BV.  We will treat for this.  Plan to follow-up with OB/GYN as patient has established OB/GYN and I am concerned that her pelvic pain is for explained by BV.  Patient is well-appearing, stable for discharge, understanding of antibiotics and use.     This patient appears reasonably screened and I doubt any other medical condition requiring further workup, evaluation, or treatment in the ED at this time prior to discharge.   Patient's vitals are WNL apart from vital sign abnormalities discussed above, patient is in NAD, and able to ambulate in the ED at their baseline. Pain has been managed or a plan has been made for home management and has no complaints prior to discharge. Patient is comfortable with above plan and is stable for discharge at this time. All questions were answered prior to disposition. Results from the ER workup discussed with the patient face to face and all questions answered to the best of my ability. The patient is safe for discharge with strict return precautions. Patient appears safe for discharge with appropriate follow-up. Conveyed my impression with the patient and they voiced understanding and are agreeable to plan.   An After Visit Summary was printed and given to the patient.  Portions of this note were generated with [employer]. Dictation errors may occur despite best attempts at proofreading.    Final Clinical Impressions(s) / ED Diagnoses   Final diagnoses:  Pelvic pain in female  BV (bacterial vaginosis)    ED Discharge Orders         Ordered    ondansetron (ZOFRAN) 4 MG tablet  Every 6 hours     07/16/19 1519     metroNIDAZOLE (FLAGYL) 500 MG tablet  2 times daily     07/16/19 1522           Pati Gallo [place], [place] 07/16/19 1537    Fredia Sorrow, MD 07/17/19 [phone]

## 2019-07-16 NOTE — ED Triage Notes (Signed)
Pt here with pelvic pain since Monday. Saw GYN and they suspect UTI. Was told to come here if it got worse and it has gotten has radiated to back. No urinary symptoms but has a lot of pressure in pelvic area.

## 2019-07-17 LAB — URINE CULTURE: Culture: NO GROWTH

## 2019-07-18 ENCOUNTER — Telehealth: Payer: Self-pay | Admitting: Internal Medicine

## 2019-07-18 ENCOUNTER — Telehealth: Payer: Self-pay | Admitting: Hematology

## 2019-07-18 DIAGNOSIS — R109 Unspecified abdominal pain: Secondary | ICD-10-CM | POA: Diagnosis not present

## 2019-07-18 DIAGNOSIS — N952 Postmenopausal atrophic vaginitis: Secondary | ICD-10-CM | POA: Diagnosis not present

## 2019-07-18 NOTE — Telephone Encounter (Signed)
Patient called to reschedule 11/23 treatment to 12/24, per patient's request appointment has been rescheduled.  Message to provider.

## 2019-07-18 NOTE — Telephone Encounter (Signed)
See pt advice request. 

## 2019-07-19 ENCOUNTER — Ambulatory Visit: Payer: BC Managed Care – PPO

## 2019-07-19 ENCOUNTER — Other Ambulatory Visit: Payer: Self-pay

## 2019-07-19 ENCOUNTER — Encounter (HOSPITAL_COMMUNITY): Payer: Self-pay

## 2019-07-19 DIAGNOSIS — Z77120 Contact with and (suspected) exposure to mold (toxic): Secondary | ICD-10-CM | POA: Diagnosis not present

## 2019-07-19 DIAGNOSIS — J45909 Unspecified asthma, uncomplicated: Secondary | ICD-10-CM | POA: Insufficient documentation

## 2019-07-19 DIAGNOSIS — Z79899 Other long term (current) drug therapy: Secondary | ICD-10-CM | POA: Diagnosis not present

## 2019-07-19 DIAGNOSIS — R102 Pelvic and perineal pain: Secondary | ICD-10-CM | POA: Insufficient documentation

## 2019-07-19 DIAGNOSIS — F4322 Adjustment disorder with anxiety: Secondary | ICD-10-CM | POA: Diagnosis not present

## 2019-07-19 DIAGNOSIS — E039 Hypothyroidism, unspecified: Secondary | ICD-10-CM | POA: Insufficient documentation

## 2019-07-19 DIAGNOSIS — K862 Cyst of pancreas: Secondary | ICD-10-CM | POA: Diagnosis not present

## 2019-07-19 NOTE — ED Triage Notes (Signed)
Patient reports that she began having vaginal pain 8 days ago and saw a urologist and wasdiagnosed with BV. Patient saw her gyn yesterday and was told she did not have BV. Patient was told if symptoms worsened to come to the Ed. Patient states she is not having low back pain, pelvic pain and states she has burning when she urinates. Patient states the pain radiates into her back.

## 2019-07-19 NOTE — Telephone Encounter (Signed)
Please let patient know I received her email  I reviewed the GI stool profile ordered by her provider in [place].  It is very interesting and provides white a bit of information some of which I am not fully familiar with/meaning I am not sure how to appropriately respond particularly as it relates to the variety of bacteria which was isolated. That said, several of the stool studies including fecal elastase and fecal calprotectin, which I previously mentioned performing have been taken care of through this stool testing.  The testing does suggest that she may be at risk for bacterial overgrowth which we can test for and also we could do a trial of pancreatic enzymes to see if this helps her overall digestion.  Pancreatic enzyme supplementation is very low risk and we would know within a few weeks if it is helpful  Would recommend the following: Creon 36,000 units 2 capsules with meals, 1 with snacks --try this for 14 days and let me know if it is helpful  After this trial if symptoms improve, we will continue therapy if not we should consider SIBO breath testing

## 2019-07-20 ENCOUNTER — Emergency Department (HOSPITAL_COMMUNITY): Payer: BC Managed Care – PPO

## 2019-07-20 ENCOUNTER — Emergency Department (HOSPITAL_COMMUNITY)
Admission: EM | Admit: 2019-07-20 | Discharge: 2019-07-20 | Disposition: A | Discharge status: Home / Self Care | Payer: BC Managed Care – PPO | Attending: Emergency Medicine | Admitting: Emergency Medicine

## 2019-07-20 DIAGNOSIS — N3020 Other chronic cystitis without hematuria: Secondary | ICD-10-CM | POA: Diagnosis not present

## 2019-07-20 DIAGNOSIS — R102 Pelvic and perineal pain: Secondary | ICD-10-CM

## 2019-07-20 DIAGNOSIS — K862 Cyst of pancreas: Secondary | ICD-10-CM | POA: Diagnosis not present

## 2019-07-20 DIAGNOSIS — N760 Acute vaginitis: Secondary | ICD-10-CM | POA: Diagnosis not present

## 2019-07-20 DIAGNOSIS — R351 Nocturia: Secondary | ICD-10-CM | POA: Diagnosis not present

## 2019-07-20 LAB — BASIC METABOLIC PANEL
Anion gap: 8 (ref 5–15)
BUN: 13 mg/dL (ref 8–23)
CO2: 26 mmol/L (ref 22–32)
Calcium: 9.5 mg/dL (ref 8.9–10.3)
Chloride: 106 mmol/L (ref 98–111)
Creatinine, Ser: 0.64 mg/dL (ref 0.44–1.00)
GFR calc Af Amer: >60 mL/min (ref >60)
GFR calc non Af Amer: >60 mL/min (ref >60)
Glucose, Bld: 87 mg/dL (ref 70–99)
Potassium: 3.8 mmol/L (ref 3.5–5.1)
Sodium: 140 mmol/L (ref 135–145)

## 2019-07-20 LAB — URINALYSIS, ROUTINE W REFLEX MICROSCOPIC
Bilirubin Urine: NEGATIVE
Glucose, UA: NEGATIVE mg/dL
Hgb urine dipstick: NEGATIVE
Ketones, ur: 20 mg/dL — AB
Nitrite: NEGATIVE
Protein, ur: NEGATIVE mg/dL
Specific Gravity, Urine: 1.013 (ref 1.005–1.030)
pH: 6 (ref 5.0–8.0)

## 2019-07-20 LAB — CBC WITH DIFFERENTIAL/PLATELET
Abs Immature Granulocytes: 0 10*3/uL (ref 0.00–0.07)
Basophils Absolute: 0 10*3/uL (ref 0.0–0.1)
Basophils Relative: 1 %
Eosinophils Absolute: 0 10*3/uL (ref 0.0–0.5)
Eosinophils Relative: 1 %
HCT: 43.3 % (ref 36.0–46.0)
Hemoglobin: 14.3 g/dL (ref 12.0–15.0)
Immature Granulocytes: 0 %
Lymphocytes Relative: 41 %
Lymphs Abs: 1.4 10*3/uL (ref 0.7–4.0)
MCH: 30.9 pg (ref 26.0–34.0)
MCHC: 33 g/dL (ref 30.0–36.0)
MCV: 93.5 fL (ref 80.0–100.0)
Monocytes Absolute: 0.3 10*3/uL (ref 0.1–1.0)
Monocytes Relative: 9 %
Neutro Abs: 1.6 10*3/uL — ABNORMAL LOW (ref 1.7–7.7)
Neutrophils Relative %: 48 %
Platelets: 216 10*3/uL (ref 150–400)
RBC: 4.63 MIL/uL (ref 3.87–5.11)
RDW: 12.4 % (ref 11.5–15.5)
WBC: 3.4 10*3/uL — ABNORMAL LOW (ref 4.0–10.5)
nRBC: 0 % (ref 0.0–0.2)

## 2019-07-20 MED ORDER — SODIUM CHLORIDE (PF) 0.9 % IJ SOLN
INTRAMUSCULAR | Status: AC
  Filled 2019-07-20: qty 50

## 2019-07-20 MED ORDER — KETOROLAC TROMETHAMINE 30 MG/ML IJ SOLN
15.0000 mg | Freq: Once | INTRAMUSCULAR | Status: AC
  Administered 2019-07-20: 15 mg via INTRAVENOUS
  Filled 2019-07-20: qty 1

## 2019-07-20 MED ORDER — IOHEXOL 300 MG/ML  SOLN
100.0000 mL | Freq: Once | INTRAMUSCULAR | Status: AC | PRN
  Administered 2019-07-20: 100 mL via INTRAVENOUS

## 2019-07-20 MED ORDER — LORAZEPAM 2 MG/ML IJ SOLN
0.5000 mg | Freq: Once | INTRAMUSCULAR | Status: AC
  Administered 2019-07-20: 0.5 mg via INTRAVENOUS
  Filled 2019-07-20: qty 1

## 2019-07-20 NOTE — Discharge Instructions (Signed)
Your CT scan today was reassuring.  It did show moderate to large volume constipation.  Your blood tests are unchanged compared to your prior ED visit.  Your urine is negative for a UTI.  It is possible that your symptoms may be due to interstitial cystitis.  We recommend discussing this with urology at your upcoming appointment.  In the interim, continue follow-up with your primary care doctor.  We advise 400 to 600 mg ibuprofen every 6 hours for management of pain.  Incidentally there was evidence of a 9 mm cyst on your pancreas.  This can be followed by your GI doctor and does not require additional emergent work-up.

## 2019-07-20 NOTE — ED Provider Notes (Signed)
[facility] DEPT Provider Note   CSN: [id number] Arrival date & time: 07/19/19  1747     History   Chief Complaint Chief Complaint  Patient presents with   Abdominal Pain   Pelvic Pain    HPI Megan [name] is a 63 y.o. female.     63 year old female presents to the emergency department for evaluation of pelvic pain.  She states that she has been feeling constant, worsening pain in her lower abdomen and pelvis x8 days.  Pain will radiate towards her back at times.  Initially was told that her symptoms may be due to bacterial vaginosis.  Was prescribed Flagyl which she has been taking.  Later followed up with her GYN who did not feel that her symptoms were secondary to BV.  She has not been taking any over-the-counter medications for her pain because she does not "like taking medicine".  Feels that there is "inflammation" when she is voiding, but denies dysuria, hematuria.  Does note more urge incontinence lately without urinary frequency.  No vaginal bleeding, vaginal discharge, nausea, vomiting, diarrhea, melena, hematochezia, fevers.  No history of abdominal surgeries.  The history is provided by the patient. No language interpreter was used.  Abdominal Pain Pelvic Pain Associated symptoms include abdominal pain.    Past Medical History:  Diagnosis Date   Allergic rhinitis    Allergy    Aneurysm of cardiac wall, congenital    a.  septal aneurysm extending into the RVOT (by cardiac MRI in 08/2013)   Anxiety    Arthritis    Asthma    Basal cell carcinoma    Depression    Heart murmur    Hypothyroidism    Leukopenia    idiopathic   Lyme disease    Mitral valve prolapse    MVP (mitral valve prolapse)    Orthostatic hypotension    Osteoporosis    Peripheral neuropathy 06/09/2012   Pernicious anemia    Plantar fasciitis    Pneumothorax     Patient Active Problem List   Diagnosis Date Noted   Iron deficiency  anemia 07/14/2019   Vitamin D deficiency, unspecified 07/30/2018   Age-related osteoporosis without current pathological fracture 07/30/2018   Estrogen excess 06/18/2018   Hypogammaglobulinemia ([place]) 01/11/2018   Right hip pain 12/30/2017   Bilateral ankle pain 11/09/2017   Hypothyroidism 10/27/2017   Hair loss 04/25/2017   Neuropathic pain of left lower extremity 04/22/2017   Great toe pain, left 04/10/2017   Precordial chest pain 12/17/2016   Aneurysm of right ventricle of heart 12/17/2016   Chronic fatigue 03/05/2016   Neuropathy associated with MGUS ([place]) 12/26/2013   Orthostatic hypotension 08/01/2013   Mitral valve prolapse 08/01/2013   Spider veins of limb 02/15/2013   Leukopenia 05/13/2012   Bilateral Dorsal Foot pain 04/23/2011   Gait abnormality 04/23/2011   Leg length inequality 04/23/2011   ASTHMA 11/16/2008   Nonspecific (abnormal) findings on radiological and other examination of body structure 07/24/2008   ABNORMAL CHEST XRAY 07/24/2008    Past Surgical History:  Procedure Laterality Date   COLONOSCOPY WITH PROPOFOL N/A 02/22/2013   Procedure: COLONOSCOPY WITH PROPOFOL;  Surgeon: Garlan Fair, MD;  Location: [facility];  Service: Endoscopy;  Laterality: N/A;   MOUTH SURGERY  01/2012   bone graft in mouth   NASAL SINUS SURGERY Right 06/12/2014   Procedure: RIGHT ENDOSCOPIC MAXILLARY ANTROSTOMY;  Surgeon: Ascencion Dike, MD;  Location: [facility];  Service: ENT;  Laterality: Right;   ROOT CANAL  02/27/2012   TONSILLECTOMY  1962     OB History   No obstetric history on file.      Home Medications    Prior to Admission medications   Medication Sig Start Date End Date Taking? Authorizing Provider  5-Hydroxytryptophan (5-HTP) 50 MG CAPS Take 100 mg by mouth daily.     [provider]  Alpha-Lipoic Acid 600 MG TABS Take 600 mg by mouth daily.    [provider]  AMINO ACIDS COMPLEX PO Take 1 Scoop  by mouth daily.     [provider]  ARMOUR THYROID 30 MG tablet Take 30 mg by mouth daily. 11/20/16   [provider]  Ascorbic Acid (VITAMIN C PO) Take 2,500 Units by mouth daily.     [provider]  B Complex Vitamins (VITAMIN B COMPLEX PO) Take 1 tablet by mouth daily.    [provider]  Cholecalciferol (VITAMIN D3) 10000 units capsule Take 10,000 Units by mouth daily.     [provider]  Cyanocobalamin (VITAMIN B-12 ER PO) Take 5,000 mcg by mouth daily.     [provider]  Digestive Enzymes (DIGESTIVE ENZYME PO) Take 1 tablet by mouth daily.    [provider]  GLUTATHIONE PO Take 250 mg by mouth daily.    [provider]  magic mouthwash w/lidocaine SOLN Take 5-10 mLs by mouth 3 (three) times daily as needed for mouth pain. 05/24/19   Brunetta Genera, MD  MAGNESIUM CITRATE PO Take 800 mg by mouth daily.     [provider]  Menaquinone-7 (VITAMIN K2 PO) Take 1 tablet by mouth daily.    [provider]  metroNIDAZOLE (FLAGYL) 500 MG tablet Take 1 tablet (500 mg total) by mouth 2 (two) times daily. 07/16/19   Tedd Sias, PA  Omega-3 Fatty Acids (FISH OIL) 1000 MG CAPS Take 1,000 mg by mouth daily.    [provider]  ondansetron (ZOFRAN) 4 MG tablet Take 1 tablet (4 mg total) by mouth every 6 (six) hours. 07/16/19   Tedd Sias, PA  thyroid (ARMOUR THYROID) 15 MG tablet Take 15 mg by mouth daily. Take with 30 mg tab 2 times weekly for total of 45 mg    [provider]  zinc gluconate 50 MG tablet Take 50 mg by mouth daily.    [provider]    Family History Family History  Problem Relation Age of Onset   Hyperlipidemia Father    Heart failure Father    Prostate cancer Father    Kidney failure Father    Hypertension Father    Angina Mother    Hypertension Mother    Lung cancer Mother    Colon cancer Mother    Lung cancer Paternal  Grandfather    Breast cancer Paternal Grandmother    Diabetes Paternal Grandmother    Esophageal cancer Neg Hx    Liver cancer Neg Hx    Pancreatic cancer Neg Hx    Rectal cancer Neg Hx    Stomach cancer Neg Hx     Social History Social History   Tobacco Use   Smoking status: Never Smoker   Smokeless tobacco: Never Used  Substance Use Topics   Alcohol use: No    Alcohol/week: 0.0 standard drinks   Drug use: No     Allergies   Augmentin [amoxicillin-pot clavulanate] and Clindamycin/lincomycin   Review of Systems Review of Systems  Gastrointestinal: Positive for abdominal pain.  Genitourinary: Positive for pelvic pain.  Ten systems reviewed and are negative for acute change, except as noted in the HPI.    Physical Exam Updated Vital Signs BP 110/64    Pulse 65    Temp 98.2 F (36.8 C) (Axillary)    Resp 14    Ht 5\' 9"  (1.753 m)    Wt 59 kg    SpO2 100%    BMI 19.20 kg/m   Physical Exam Vitals signs and nursing note reviewed.  Constitutional:      General: She is not in acute distress.    Appearance: She is well-developed. She is not diaphoretic.     Comments: Nontoxic-appearing and in no acute distress  HENT:     Head: Normocephalic and atraumatic.  Eyes:     General: No scleral icterus.    Conjunctiva/sclera: Conjunctivae normal.  Neck:     Musculoskeletal: Normal range of motion.  Pulmonary:     Effort: Pulmonary effort is normal. No respiratory distress.     Comments: Respirations even and unlabored Abdominal:     Palpations: Abdomen is soft. There is no mass.     Tenderness: There is no abdominal tenderness.     Comments: Abdomen is soft, nondistended.  No reproducible tenderness.  No palpable masses.  Musculoskeletal: Normal range of motion.  Skin:    General: Skin is warm and dry.     Coloration: Skin is not pale.     Findings: No erythema or rash.  Neurological:     Mental Status: She is alert and oriented to person, place, and time.    Psychiatric:        Behavior: Behavior normal.      ED Treatments / Results  Labs (all labs ordered are listed, but only abnormal results are displayed) Labs Reviewed  CBC WITH DIFFERENTIAL/PLATELET - Abnormal; Notable for the following components:      Result Value   WBC 3.4 (*)    Neutro Abs 1.6 (*)    All other components within normal limits  URINALYSIS, ROUTINE W REFLEX MICROSCOPIC - Abnormal; Notable for the following components:   Ketones, ur 20 (*)    Leukocytes,Ua MODERATE (*)    Bacteria, UA RARE (*)    All other components within normal limits  BASIC METABOLIC PANEL    EKG None  Radiology Ct Abdomen Pelvis W Contrast  Result Date: 07/20/2019 CLINICAL DATA:  Vaginal pain for 8 days, burning with urination, pain radiates to back EXAM: CT ABDOMEN AND PELVIS WITH CONTRAST TECHNIQUE: Multidetector CT imaging of the abdomen and pelvis was performed using the standard protocol following bolus administration of intravenous contrast. CONTRAST:  126mL OMNIPAQUE IOHEXOL 300 MG/ML  SOLN COMPARISON:  CT 08/08/2016, MR 02/21/2019 FINDINGS: Lower chest: Lung bases are clear. Normal heart size. No pericardial effusion. Hepatobiliary: No focal liver abnormality is seen. No gallstones, gallbladder wall thickening, or biliary dilatation. Pancreas: No pancreatic ductal dilatation or surrounding inflammatory changes. 9 mm hypoattenuating cystic lesion in the body/tail of the pancreas (3/16). Spleen: Normal in size without focal abnormality. Adrenals/Urinary Tract: Adrenal glands are unremarkable. Kidneys are normal, without renal calculi, focal lesion, or hydronephrosis. Bladder is unremarkable. Stomach/Bowel: Distal esophagus, stomach and duodenal sweep are unremarkable. No small bowel wall thickening or dilatation. No evidence of obstruction. A normal appendix is visualized. Large volume of stool throughout the colon. No colonic dilatation or wall thickening. Vascular/Lymphatic:  Atherosclerotic plaque within the normal caliber aorta. No suspicious  or enlarged lymph nodes in the included lymphatic chains. Reproductive: Normal uterus. No concerning adnexal lesions. Other: Trace amount of low-attenuation free fluid in the deep pelvis. No free air. No bowel containing hernias. Mild body wall edema. Musculoskeletal: Multilevel degenerative changes are present in the imaged portions of the spine. Features most pronounced at L5-S1. IMPRESSION: 1. Large volume of stool throughout the colon, suggestive of constipation/slowed intestinal transit. 2. Small volume of nonspecific low-attenuation free fluid in the pelvis. 3. 9 mm cystic lesion in the tail the pancreas. Recommend 12 month follow-up pancreatic protocol MRI or CT. This recommendation follows ACR consensus guidelines: Management of Incidental Pancreatic Cysts: A White Paper of the ACR Incidental Findings Committee. J Am Coll Radiol [id number]. 4. Aortic Atherosclerosis (ICD10-I70.0). Electronically Signed   By: Lovena Le M.D.   On: 07/20/2019 03:28    Procedures Procedures (including critical care time)  Medications Ordered in ED Medications  sodium chloride (PF) 0.9 % injection (has no administration in time range)  ketorolac (TORADOL) 30 MG/ML injection 15 mg (15 mg Intravenous Given 07/20/19 0217)  iohexol (OMNIPAQUE) 300 MG/ML solution 100 mL (100 mLs Intravenous Contrast Given 07/20/19 0306)  LORazepam (ATIVAN) injection 0.5 mg (0.5 mg Intravenous Given 07/20/19 0303)     Initial Impression / Assessment and Plan / ED Course  I have reviewed the triage vital signs and the nursing notes.  Pertinent labs & imaging results that were available during my care of the patient were reviewed by me and considered in my medical decision making (see chart for details).        50:65 AM 63 year old female presenting for ongoing lower abdominal/pelvic pain.  Complaining of some urge incontinence and also describes a  sensation of "inflammation" when voiding without dysuria.  Her abdominal exam is fairly benign.  Prior work-up reviewed which was generally reassuring.  Urine culture was negative at that time.  She did not undergo any abdominal imaging.  Did discuss this with the patient today who is opted for CT with contrast.  DDx includes nephrolithiasis vs pelvic mass vs interstitial cystitis vs sigmoid diverticulitis vs proctitis.  Will give IV Toradol for pain as patient would like to forego narcotics and she is to remain NPO.  4:37 AM Labs reassuring. CT with large volume stool burden. Patient reports this to be normal for her; continues to stool daily. Unclear if this is contributing to pain. Incidental 81mm pancreatic cyst. I do not feel this is responsible for patient's symptoms. Unable to exclude IC; discussed with patient that it would be worth continuing f/u with Urology as previous referred. Pain improves with Toradol. Counseled on use of Motrin as outpatient. Return precautions discussed and provided. Patient discharged in stable condition with no unaddressed concerns.   Final Clinical Impressions(s) / ED Diagnoses   Final diagnoses:  Pelvic pain in female    ED Discharge Orders    None       Antonietta Breach, PA-C 07/20/19 0440    Merrily Pew, MD 07/20/19 [phone]

## 2019-07-20 NOTE — ED Notes (Signed)
Pt ambulatory to restroom without concern

## 2019-07-24 DIAGNOSIS — Z20828 Contact with and (suspected) exposure to other viral communicable diseases: Secondary | ICD-10-CM | POA: Diagnosis not present

## 2019-07-24 DIAGNOSIS — B370 Candidal stomatitis: Secondary | ICD-10-CM | POA: Diagnosis not present

## 2019-07-25 DIAGNOSIS — M545 Low back pain: Secondary | ICD-10-CM | POA: Diagnosis not present

## 2019-07-25 NOTE — Telephone Encounter (Signed)
I have reviewed the 3 emails sent in the last several days/1 week. I also see she was back in the ER and had a CT scan performed on the abd/pelvis.  I have reviewed this scan which changes my opinion slightly at present. The scan shows increased colonic stool and she has a hx of constipation.  Though, we spoke in clinic several weeks ago about a change to looser stools.  This made me wonder about exocrine panc insuff and trying pancreatic enzymes.  While there is little downside to this trial, I would recommend the following:  1) Proceed with SIBO breath testing 2) Purge colon with MiraLax prep -- it is likely that constipation is the predominant issue and this can cause loose stools (overflow), borborygmi, and obstipation symptoms 3) After the purge, would resume normal laxative regimen and we can observe while SIBO test pending 4) Once this is done we can try panc enzymes if symptoms persist  Thanks JMP

## 2019-07-25 NOTE — Telephone Encounter (Signed)
I discussed the CT findings of the pancreas cyst with a colleague who specializes in pancreaticobiliary GI. He recommended surveillance CT versus MRI in 6 months given the lesion in new and then annual thereafter. Nothing further would be needed unless this lesion changes in size (gets bigger). Thus, I would recommend MRI with and without contrast + MRCP (given this test does not have radiation) in 6 months.

## 2019-07-25 NOTE — Telephone Encounter (Signed)
Given atherosclerosis, would treat with lipitor 40 mg daily with lipids and liver in 12 weeks if pt willing  Megan [name]

## 2019-07-26 DIAGNOSIS — F4322 Adjustment disorder with anxiety: Secondary | ICD-10-CM | POA: Diagnosis not present

## 2019-07-27 DIAGNOSIS — M9902 Segmental and somatic dysfunction of thoracic region: Secondary | ICD-10-CM | POA: Diagnosis not present

## 2019-07-27 DIAGNOSIS — M9905 Segmental and somatic dysfunction of pelvic region: Secondary | ICD-10-CM | POA: Diagnosis not present

## 2019-07-27 DIAGNOSIS — M9903 Segmental and somatic dysfunction of lumbar region: Secondary | ICD-10-CM | POA: Diagnosis not present

## 2019-07-27 DIAGNOSIS — M62830 Muscle spasm of back: Secondary | ICD-10-CM | POA: Diagnosis not present

## 2019-07-28 DIAGNOSIS — L219 Seborrheic dermatitis, unspecified: Secondary | ICD-10-CM | POA: Diagnosis not present

## 2019-07-28 DIAGNOSIS — L659 Nonscarring hair loss, unspecified: Secondary | ICD-10-CM | POA: Diagnosis not present

## 2019-07-28 DIAGNOSIS — N952 Postmenopausal atrophic vaginitis: Secondary | ICD-10-CM | POA: Diagnosis not present

## 2019-07-28 DIAGNOSIS — L650 Telogen effluvium: Secondary | ICD-10-CM | POA: Diagnosis not present

## 2019-07-28 DIAGNOSIS — E611 Iron deficiency: Secondary | ICD-10-CM | POA: Diagnosis not present

## 2019-07-28 DIAGNOSIS — E039 Hypothyroidism, unspecified: Secondary | ICD-10-CM | POA: Diagnosis not present

## 2019-07-28 DIAGNOSIS — D519 Vitamin B12 deficiency anemia, unspecified: Secondary | ICD-10-CM | POA: Diagnosis not present

## 2019-08-01 DIAGNOSIS — M542 Cervicalgia: Secondary | ICD-10-CM | POA: Diagnosis not present

## 2019-08-01 DIAGNOSIS — M9905 Segmental and somatic dysfunction of pelvic region: Secondary | ICD-10-CM | POA: Diagnosis not present

## 2019-08-01 DIAGNOSIS — M25511 Pain in right shoulder: Secondary | ICD-10-CM | POA: Diagnosis not present

## 2019-08-01 DIAGNOSIS — F4322 Adjustment disorder with anxiety: Secondary | ICD-10-CM | POA: Diagnosis not present

## 2019-08-01 DIAGNOSIS — M62830 Muscle spasm of back: Secondary | ICD-10-CM | POA: Diagnosis not present

## 2019-08-01 DIAGNOSIS — M9902 Segmental and somatic dysfunction of thoracic region: Secondary | ICD-10-CM | POA: Diagnosis not present

## 2019-08-01 DIAGNOSIS — M9903 Segmental and somatic dysfunction of lumbar region: Secondary | ICD-10-CM | POA: Diagnosis not present

## 2019-08-02 DIAGNOSIS — M62830 Muscle spasm of back: Secondary | ICD-10-CM | POA: Diagnosis not present

## 2019-08-02 DIAGNOSIS — M9905 Segmental and somatic dysfunction of pelvic region: Secondary | ICD-10-CM | POA: Diagnosis not present

## 2019-08-02 DIAGNOSIS — M9903 Segmental and somatic dysfunction of lumbar region: Secondary | ICD-10-CM | POA: Diagnosis not present

## 2019-08-02 DIAGNOSIS — M9902 Segmental and somatic dysfunction of thoracic region: Secondary | ICD-10-CM | POA: Diagnosis not present

## 2019-08-03 ENCOUNTER — Encounter: Payer: Self-pay | Admitting: Internal Medicine

## 2019-08-07 ENCOUNTER — Encounter: Payer: Self-pay | Admitting: Internal Medicine

## 2019-08-07 DIAGNOSIS — R140 Abdominal distension (gaseous): Secondary | ICD-10-CM | POA: Diagnosis not present

## 2019-08-08 DIAGNOSIS — H43813 Vitreous degeneration, bilateral: Secondary | ICD-10-CM | POA: Diagnosis not present

## 2019-08-08 DIAGNOSIS — H0102B Squamous blepharitis left eye, upper and lower eyelids: Secondary | ICD-10-CM | POA: Diagnosis not present

## 2019-08-08 DIAGNOSIS — H40013 Open angle with borderline findings, low risk, bilateral: Secondary | ICD-10-CM | POA: Diagnosis not present

## 2019-08-08 DIAGNOSIS — H0102A Squamous blepharitis right eye, upper and lower eyelids: Secondary | ICD-10-CM | POA: Diagnosis not present

## 2019-08-09 DIAGNOSIS — F4322 Adjustment disorder with anxiety: Secondary | ICD-10-CM | POA: Diagnosis not present

## 2019-08-10 ENCOUNTER — Encounter: Payer: Self-pay | Admitting: Hematology

## 2019-08-11 ENCOUNTER — Other Ambulatory Visit: Payer: Self-pay | Admitting: Hematology

## 2019-08-11 ENCOUNTER — Encounter: Payer: Self-pay | Admitting: *Deleted

## 2019-08-16 ENCOUNTER — Ambulatory Visit (INDEPENDENT_AMBULATORY_CARE_PROVIDER_SITE_OTHER): Payer: BC Managed Care – PPO | Admitting: Internal Medicine

## 2019-08-16 ENCOUNTER — Encounter: Payer: Self-pay | Admitting: Internal Medicine

## 2019-08-16 VITALS — BP 98/70 | HR 68 | Temp 97.6°F | Ht 69.25 in | Wt 130.0 lb

## 2019-08-16 DIAGNOSIS — D510 Vitamin B12 deficiency anemia due to intrinsic factor deficiency: Secondary | ICD-10-CM | POA: Diagnosis not present

## 2019-08-16 DIAGNOSIS — K3189 Other diseases of stomach and duodenum: Secondary | ICD-10-CM | POA: Diagnosis not present

## 2019-08-16 DIAGNOSIS — K5909 Other constipation: Secondary | ICD-10-CM

## 2019-08-16 DIAGNOSIS — K599 Functional intestinal disorder, unspecified: Secondary | ICD-10-CM

## 2019-08-16 DIAGNOSIS — K862 Cyst of pancreas: Secondary | ICD-10-CM

## 2019-08-16 DIAGNOSIS — Z1159 Encounter for screening for other viral diseases: Secondary | ICD-10-CM

## 2019-08-16 DIAGNOSIS — Z800 Family history of malignant neoplasm of digestive organs: Secondary | ICD-10-CM

## 2019-08-16 DIAGNOSIS — F4322 Adjustment disorder with anxiety: Secondary | ICD-10-CM | POA: Diagnosis not present

## 2019-08-16 NOTE — Progress Notes (Signed)
Subjective:    Patient ID: Megan [name], female    DOB: [birth year], 63 y.o.   MRN: [id number]  HPI Dave Arters is a 63 year old female with a past medical history of chronic constipation/colonic inertia, family history of colon cancer in her mother, family history of colon polyps in her father, pernicious anemia, hypothyroidism, osteoporosis who is seen in follow-up.  She is here alone today and I last saw her on 07/04/2019.  Since being seen here she developed an uncomfortable pelvic pain that lasted 3 weeks.  This was a burning type pain and constant.  It led her to have a GYN evaluation which was negative and gynecology did not feel there was explanation for this pain.  She spoke to a neighbor who is also a urologist and ended up in the ER where a CT scan was performed.  This showed constipation, and incidental pancreatic cyst.  She then had a cystoscopy which revealed a normal bladder.  She was also scheduled for pelvic floor physical therapy which her appointment is tomorrow.  She completed SIBO breath testing which was negative.  We reviewed this together today.  She continues to have issues with constipation and this is despite very high fiber plant-based diet, vitamin C and magnesium.  She is also stopped her calcium supplementation with concern it could cause constipation.  She has abdominal fullness, bloating and incomplete evacuation symptom.  She recalls with her chronic constipation having been evaluated over 30 years ago being started on Prozac.  The Prozac helped her constipation for many years but she eventually weaned herself off of this.  She does not wish to use an SSRI going forward as she does not feel this is needed.  She also is concerned about stigma of depression and anxiety associated with this medicine but also the fact that it made her feel "numb" to normal emotions.  She is interested in trying an over-the-counter product/herbal supplement called Motility Activator  which is a combination of ginger and artichoke extract.  She is also scheduled for IV iron.   Review of Systems As per HPI, otherwise negative  Current Medications, Allergies, Past Medical History, Past Surgical History, Family History and Social History were reviewed in [employer] record.     Objective:   Physical Exam BP 98/70   Pulse 68   Temp 97.6 F (36.4 C)   Ht 5' 9.25" (1.759 m)   Wt 130 lb (59 kg)   BMI 19.06 kg/m  Gen: awake, alert, NAD HEENT: anicteric Neuro: nonfocal  CT ABDOMEN AND PELVIS WITH CONTRAST   TECHNIQUE: Multidetector CT imaging of the abdomen and pelvis was performed using the standard protocol following bolus administration of intravenous contrast.   CONTRAST:  143mL OMNIPAQUE IOHEXOL 300 MG/ML  SOLN   COMPARISON:  CT 08/08/2016, MR 02/21/2019   FINDINGS: Lower chest: Lung bases are clear. Normal heart size. No pericardial effusion.   Hepatobiliary: No focal liver abnormality is seen. No gallstones, gallbladder wall thickening, or biliary dilatation.   Pancreas: No pancreatic ductal dilatation or surrounding inflammatory changes. 9 mm hypoattenuating cystic lesion in the body/tail of the pancreas (3/16).   Spleen: Normal in size without focal abnormality.   Adrenals/Urinary Tract: Adrenal glands are unremarkable. Kidneys are normal, without renal calculi, focal lesion, or hydronephrosis. Bladder is unremarkable.   Stomach/Bowel: Distal esophagus, stomach and duodenal sweep are unremarkable. No small bowel wall thickening or dilatation. No evidence of obstruction. A normal appendix is visualized. Large volume  of stool throughout the colon. No colonic dilatation or wall thickening.   Vascular/Lymphatic: Atherosclerotic plaque within the normal caliber aorta. No suspicious or enlarged lymph nodes in the included lymphatic chains.   Reproductive: Normal uterus. No concerning adnexal lesions.   Other: Trace  amount of low-attenuation free fluid in the deep pelvis. No free air. No bowel containing hernias. Mild body wall edema.   Musculoskeletal: Multilevel degenerative changes are present in the imaged portions of the spine. Features most pronounced at L5-S1.   IMPRESSION: 1. Large volume of stool throughout the colon, suggestive of constipation/slowed intestinal transit. 2. Small volume of nonspecific low-attenuation free fluid in the pelvis. 3. 9 mm cystic lesion in the tail the pancreas. Recommend 12 month follow-up pancreatic protocol MRI or CT. This recommendation follows ACR consensus guidelines: Management of Incidental Pancreatic Cysts: A White Paper of the ACR Incidental Findings Committee. J Am Coll Radiol [id number]. 4. Aortic Atherosclerosis (ICD10-I70.0).     Electronically Signed   By: Lovena Le M.D.   On: 07/20/2019 03:28     CBC    Component Value Date/Time   WBC 3.4 (L) 07/20/2019 0208   RBC 4.63 07/20/2019 0208   HGB 14.3 07/20/2019 0208   HGB 13.7 03/15/2013 1144   HCT 43.3 07/20/2019 0208   HCT 40.8 03/15/2013 1144   PLT 216 07/20/2019 0208   PLT 210 03/15/2013 1144   MCV 93.5 07/20/2019 0208   MCV 88.9 03/15/2013 1144   MCH 30.9 07/20/2019 0208   MCHC 33.0 07/20/2019 0208   RDW 12.4 07/20/2019 0208   RDW 13.8 03/15/2013 1144   LYMPHSABS 1.4 07/20/2019 0208   LYMPHSABS 1.0 03/15/2013 1144   MONOABS 0.3 07/20/2019 0208   MONOABS 0.3 03/15/2013 1144   EOSABS 0.0 07/20/2019 0208   EOSABS 0.0 03/15/2013 1144   BASOSABS 0.0 07/20/2019 0208   BASOSABS 0.1 03/15/2013 1144   CMP     Component Value Date/Time   NA 140 07/20/2019 0208   NA 143 01/19/2018 0941   NA 140 05/24/2012 1053   K 3.8 07/20/2019 0208   K 4.1 05/24/2012 1053   CL 106 07/20/2019 0208   CL 106 05/24/2012 1053   CO2 26 07/20/2019 0208   CO2 27 05/24/2012 1053   GLUCOSE 87 07/20/2019 0208   GLUCOSE 72 05/24/2012 1053   BUN 13 07/20/2019 0208   BUN 11 01/19/2018 0941     BUN 13.0 05/24/2012 1053   CREATININE 0.64 07/20/2019 0208   CREATININE 0.79 03/03/2019 1432   CREATININE 0.60 11/04/2017 0918   CREATININE 0.7 05/24/2012 1053   CALCIUM 9.5 07/20/2019 0208   CALCIUM 9.5 05/24/2012 1053   PROT 7.5 03/03/2019 1432   PROT CANCELED 01/19/2018 0941   PROT 6.6 05/24/2012 1053   ALBUMIN 4.6 03/03/2019 1432   ALBUMIN 4.0 05/24/2012 1053   AST 50 (H) 03/03/2019 1432   AST 29 05/24/2012 1053   ALT 31 03/03/2019 1432   ALT 13 05/24/2012 1053   ALKPHOS 86 03/03/2019 1432   ALKPHOS 65 05/24/2012 1053   BILITOT 1.0 03/03/2019 1432   BILITOT 0.50 05/24/2012 1053   GFRNONAA >60 07/20/2019 0208   GFRNONAA >60 03/03/2019 1432   GFRNONAA 98 11/04/2017 0918   GFRAA >60 07/20/2019 0208   GFRAA >60 03/03/2019 1432   GFRAA 113 11/04/2017 0918       Assessment & Plan:  63 year old female with a past medical history of chronic constipation/colonic inertia, family history of colon cancer in her mother, family  history of colon polyps in her father, pernicious anemia, hypothyroidism, osteoporosis who is seen in follow-up.  1.  Chronic constipation/colonic inertia --having reviewed the CT scan, further discussed and considered her history I feel that majority of her symptoms abdominal bloating, pelvic pressure all relate to chronic constipation and colonic inertia/dysmotility.  The pelvic burning pain she had recently also may be constipation related.  We discussed how looser stools in the last few months which were episodic may also be an overflow type diarrhea.  We discussed this at length today including bowel purge, herbal remedies and also pharmaceuticals including Linzess, Amitiza, Trulance or Motegrity. --We will start with a MiraLAX purge; 255 g of MiraLAX in 64 ounces.  Consume over 3 to 4 hours.  If no result could try stimulation with 1 or 2 fleets enemas or repeat half dose MiraLAX purge. --She would like to start with the herbal Motility Activator before trying  a pharmacologic medicine as above --For now she can continue vitamin C and magnesium  2.  Pernicious anemia --confirmed with antiparietal and antiintrinsic factor antibody.  Continue B12 supplements via sublingual route.  I have also recommended we proceed with upper endoscopy to evaluate for gastritis but also plan small bowel biopsies to exclude celiac disease.  We discussed upper endoscopy including the risk, benefits and alternatives and she is agreeable and wishes to proceed.  Pernicious anemia can be associated with celiac disease, gastritis and hyper gastrin states.  3.  Family history of colon cancer --she is up-to-date with screening colonoscopy.  I completed her colonoscopy in July 2019 --Repeat screening colonoscopy in July 2024  4.  Pancreatic cyst --9 mm cyst in the pancreatic tail, we discussed this today.  Serial imaging is recommended.  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  This is reasonable.  If stable would likely image again in 12 months and if not consider EUS --MRI abdomen with and without contrast pancreas protocol plus MRCP May 2021  40 minutes spent with the patient today. Greater than 50% was spent in counseling and coordination of care with the patient

## 2019-08-16 NOTE — Patient Instructions (Addendum)
You have been scheduled for an endoscopy. Please follow written instructions given to you at your visit today. If you use inhalers (even only as needed), please bring them with you on the day of your procedure. Your physician has requested that you go to www.startemmi.com and enter the access code given to you at your visit today. This web site gives a general overview about your procedure. However, you should still follow specific instructions given to you by our office regarding your preparation for the procedure. _____________________________________________________________ Dr Hilarie Fredrickson recommends that you complete a bowel purge (to clean out your bowels). Please do the following: Purchase a bottle of Miralax over the counter as well as a box of 5 mg dulcolax tablets. Take 4 dulcolax tablets. Wait 1 hour. You will then drink 6-8 capfuls of Miralax mixed in 64 ounces of water/juice/gatorade (you may choose which of these liquids to drink) over the next 2-3 hours. You should expect results within 1 to 6 hours after completing the bowel purge.  You may complete an additional 1/2 purge if needed (3-4 capfuls Miralax in 32 ounces water/juice/gatorade) OR use a Fleets enema should the first purge not be fully effective. ____________________________________________________________   Dennis Bast may try the herbal laxative.  ____________________________________________________________  Dennis Bast will be due for MRI/MRCP in May 2021. We will contact you with an appointment when it gets closer to that time. In addition, you will need labwork prior to this appointment to ensure your kidneys are well enough to filter the contrast we will need you to have for the MRI.  _____________________________________________________________ If you are age 72 or older, your body mass index should be between 23-30. Your Body mass index is 19.06 kg/m. If this is out of the aforementioned range listed, please consider follow up with  your Primary Care Provider.  If you are age 64 or younger, your body mass index should be between 19-25. Your Body mass index is 19.06 kg/m. If this is out of the aformentioned range listed, please consider follow up with your Primary Care Provider.

## 2019-08-17 DIAGNOSIS — M62838 Other muscle spasm: Secondary | ICD-10-CM | POA: Diagnosis not present

## 2019-08-17 DIAGNOSIS — N3020 Other chronic cystitis without hematuria: Secondary | ICD-10-CM | POA: Diagnosis not present

## 2019-08-17 DIAGNOSIS — M6289 Other specified disorders of muscle: Secondary | ICD-10-CM | POA: Diagnosis not present

## 2019-08-17 DIAGNOSIS — M6281 Muscle weakness (generalized): Secondary | ICD-10-CM | POA: Diagnosis not present

## 2019-08-18 ENCOUNTER — Other Ambulatory Visit: Payer: Self-pay

## 2019-08-18 ENCOUNTER — Inpatient Hospital Stay (HOSPITAL_BASED_OUTPATIENT_CLINIC_OR_DEPARTMENT_OTHER): Payer: BC Managed Care – PPO | Admitting: Medical

## 2019-08-18 ENCOUNTER — Inpatient Hospital Stay: Payer: BC Managed Care – PPO | Attending: Hematology

## 2019-08-18 VITALS — BP 104/86 | HR 62 | Temp 98.6°F | Resp 16

## 2019-08-18 DIAGNOSIS — D508 Other iron deficiency anemias: Secondary | ICD-10-CM

## 2019-08-18 DIAGNOSIS — T8090XA Unspecified complication following infusion and therapeutic injection, initial encounter: Secondary | ICD-10-CM

## 2019-08-18 DIAGNOSIS — D510 Vitamin B12 deficiency anemia due to intrinsic factor deficiency: Secondary | ICD-10-CM | POA: Insufficient documentation

## 2019-08-18 MED ORDER — SODIUM CHLORIDE 0.9 % IV SOLN
Freq: Once | INTRAVENOUS | Status: AC
  Filled 2019-08-18: qty 250

## 2019-08-18 MED ORDER — LORATADINE 10 MG PO TABS
ORAL_TABLET | ORAL | Status: AC
  Filled 2019-08-18: qty 1

## 2019-08-18 MED ORDER — SODIUM CHLORIDE 0.9 % IV SOLN
750.0000 mg | Freq: Once | INTRAVENOUS | Status: AC
  Administered 2019-08-18: 750 mg via INTRAVENOUS
  Filled 2019-08-18: qty 15

## 2019-08-18 MED ORDER — ACETAMINOPHEN 325 MG PO TABS
650.0000 mg | ORAL_TABLET | Freq: Once | ORAL | Status: AC
  Administered 2019-08-18: 650 mg via ORAL

## 2019-08-18 MED ORDER — ACETAMINOPHEN 325 MG PO TABS
ORAL_TABLET | ORAL | Status: AC
  Filled 2019-08-18: qty 2

## 2019-08-18 MED ORDER — LORATADINE 10 MG PO TABS
10.0000 mg | ORAL_TABLET | Freq: Once | ORAL | Status: AC
  Administered 2019-08-18: 10 mg via ORAL

## 2019-08-18 MED ORDER — FAMOTIDINE IN NACL 20-0.9 MG/50ML-% IV SOLN
20.0000 mg | Freq: Once | INTRAVENOUS | Status: AC | PRN
  Administered 2019-08-18: 20 mg via INTRAVENOUS

## 2019-08-18 NOTE — Patient Instructions (Signed)

## 2019-08-18 NOTE — Progress Notes (Signed)
At approximately 1025 patient began to complain of dizziness. Vitals taken and were stable. Megan [name] was called to chair and ordered pepcid.  Patient reports blurriness has resolved and is feeling better. Vitals taken again and are stable.

## 2019-08-18 NOTE — Progress Notes (Signed)
    DATE:  08/18/2019                                          X             IRON INFUSION    MD:  Dr. Sullivan Lone   AGENT/BLOOD [place]:              Injectafer   AGENT/BLOOD PRODUCT RECEIVING IMMEDIATELY PRIOR TO REACTION:          Injectafer    VS: BP:      106/67   P:        61       SPO2:        100% on room air                  REACTION(S):            Flushing and facial warmth after completion of her infusion.   PREMEDS:      Tylenol and Claritin   INTERVENTION: Pepcid 20 mg IV x1   Review of Systems  Review of Systems  Constitutional: Negative for chills, diaphoresis and fever.  HENT: Negative for trouble swallowing and voice change.   Respiratory: Negative for cough, chest tightness, shortness of breath and wheezing.   Cardiovascular: Negative for chest pain and palpitations.  Gastrointestinal: Negative for abdominal pain, constipation, diarrhea, nausea and vomiting.  Musculoskeletal: Negative for back pain and myalgias.  Skin:       Flushing and facial and warmth  Neurological: Negative for dizziness, light-headedness and headaches.     Physical Exam  Physical Exam Constitutional:      General: She is not in acute distress.    Appearance: She is not diaphoretic.  HENT:     Head: Normocephalic and atraumatic.  Cardiovascular:     Rate and Rhythm: Normal rate and regular rhythm.     Heart sounds: Normal heart sounds. No murmur. No friction rub. No gallop.   Pulmonary:     Effort: Pulmonary effort is normal. No respiratory distress.     Breath sounds: Normal breath sounds. No wheezing or rales.  Skin:    General: Skin is warm and dry.     Findings: Erythema (Mild facial erythema) present. No rash.  Neurological:     Mental Status: She is alert.     OUTCOME:                 The patient's symptoms abated after she was given Pepcid IV x1.  She was released home after her symptoms improved.   Sandi Mealy, MHS, PA-C

## 2019-08-22 DIAGNOSIS — M25511 Pain in right shoulder: Secondary | ICD-10-CM | POA: Diagnosis not present

## 2019-08-22 DIAGNOSIS — M542 Cervicalgia: Secondary | ICD-10-CM | POA: Diagnosis not present

## 2019-08-24 ENCOUNTER — Telehealth: Payer: Self-pay | Admitting: Internal Medicine

## 2019-08-24 DIAGNOSIS — K5900 Constipation, unspecified: Secondary | ICD-10-CM | POA: Diagnosis not present

## 2019-08-24 DIAGNOSIS — R5383 Other fatigue: Secondary | ICD-10-CM | POA: Diagnosis not present

## 2019-08-24 DIAGNOSIS — R5381 Other malaise: Secondary | ICD-10-CM | POA: Diagnosis not present

## 2019-08-24 DIAGNOSIS — E785 Hyperlipidemia, unspecified: Secondary | ICD-10-CM | POA: Diagnosis not present

## 2019-08-24 DIAGNOSIS — G629 Polyneuropathy, unspecified: Secondary | ICD-10-CM | POA: Diagnosis not present

## 2019-08-24 NOTE — Telephone Encounter (Signed)
Spoke to the patient and told the patient per Dr. Vena Rua office note on 12/22 if the MiraLax purge did not work to use 1 or 2 fleets enemas for relief or to ingest a repeat half dose of the purge. The patient stated she would try the enema to see if she could get relief. We spoke about the correct way to administer an enema and positioning. Verbalized understanding. No other questions by the conclusion of the call.

## 2019-08-25 DIAGNOSIS — M6281 Muscle weakness (generalized): Secondary | ICD-10-CM | POA: Diagnosis not present

## 2019-08-25 DIAGNOSIS — R351 Nocturia: Secondary | ICD-10-CM | POA: Diagnosis not present

## 2019-08-25 DIAGNOSIS — M62838 Other muscle spasm: Secondary | ICD-10-CM | POA: Diagnosis not present

## 2019-08-25 DIAGNOSIS — M6289 Other specified disorders of muscle: Secondary | ICD-10-CM | POA: Diagnosis not present

## 2019-08-29 ENCOUNTER — Other Ambulatory Visit: Payer: Self-pay | Admitting: Hematology

## 2019-08-30 DIAGNOSIS — F4322 Adjustment disorder with anxiety: Secondary | ICD-10-CM | POA: Diagnosis not present

## 2019-08-30 NOTE — Progress Notes (Signed)
HPI: FUdyspnea. MRA January 2015 showed aneurysmal dilatation of the membranous ventricular septum extending into the right ventricular outflow tract. There was no sinus of Valsalva aneurysm. Ejection fraction 57%. Carotid Dopplers August 2016 normal. Echocardiogram May 2018 showed normal LV systolic function and mild mitral regurgitation. 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 Lower ext arterial Dopplers May 2018 normal.Stress echocardiogramFeb 2019was normal. CTA June 2019 showed aneurysm of the membranous ventricular septum that bulges into the right ventricular outflow tract. There is no connection between the left ventricle and right ventricle. There is no sinus of Valsalva aneurysm. Calcium score 0. No coronary disease. ABIs August 2020 showed noncompressible left lower extremity artery and normal right.  Abdominal CT November 2020 showed 9 mm cystic lesion in the tail of the pancreas and follow-up recommended in 12 months.  There was also note of aortic atherosclerosis.  Most recent lipid panel on June 23, 2019 showed total cholesterol 239, LDL 131 and HDL 99.  Since last seen the patient denies any dyspnea on exertion, orthopnea, PND, pedal edema, palpitations, syncope or chest pain.   Current Outpatient Medications  Medication Sig Dispense Refill  . 5-Hydroxytryptophan (5-HTP) 100 MG CAPS Take 100 mg by mouth daily.     . Alpha-Lipoic Acid 600 MG TABS Take 600 mg by mouth daily.    . AMINO ACIDS COMPLEX PO Take 1 Scoop by mouth daily.     Francia Greaves THYROID 30 MG tablet Take 30 mg by mouth daily.  6  . Ascorbic Acid (VITAMIN C PO) Take 2,500 Units by mouth daily.     . B Complex Vitamins (VITAMIN B COMPLEX PO) Take 1 tablet by mouth daily.    . Cholecalciferol (VITAMIN D3) 10000 units capsule Take 10,000 Units by mouth daily.     . Cyanocobalamin (VITAMIN B-12 ER PO) Take 5,000 mcg by mouth daily.     . Digestive Enzymes (DIGESTIVE ENZYME PO)  Take 1 tablet by mouth daily.    [name] GLUTATHIONE PO Take 250 mg by mouth daily.    . magic mouthwash w/lidocaine SOLN Take 5-10 mLs by mouth 3 (three) times daily as needed for mouth pain. 480 mL 1  . MAGNESIUM CITRATE PO Take 800 mg by mouth daily.     . Menaquinone-7 (VITAMIN K2 PO) Take 1 tablet by mouth daily.    . Omega-3 Fatty Acids (FISH OIL) 1000 MG CAPS Take 1,000 mg by mouth daily.    . ondansetron (ZOFRAN) 4 MG tablet Take 1 tablet (4 mg total) by mouth every 6 (six) hours. 12 tablet 0  . zinc gluconate 50 MG tablet Take 50 mg by mouth daily.     No current facility-administered medications for this visit.     Past Medical History:  Diagnosis Date  . Allergic rhinitis   . Allergy   . Aneurysm of cardiac wall, congenital    a.  septal aneurysm extending into the RVOT (by cardiac MRI in 08/2013)  . Anxiety   . Aortic atherosclerosis ([place])   . Arthritis   . Asthma   . Basal cell carcinoma   . Depression   . Heart murmur   . Hypothyroidism   . Leukopenia    idiopathic  . Lyme disease   . Mitral valve prolapse   . MVP (mitral valve prolapse)   . Orthostatic hypotension   . Osteoporosis   . Pancreatic cyst   . Peripheral neuropathy 06/09/2012  . Pernicious  anemia   . Pernicious anemia   . Plantar fasciitis   . Pneumothorax     Past Surgical History:  Procedure Laterality Date  . COLONOSCOPY WITH PROPOFOL N/A 02/22/2013   Procedure: COLONOSCOPY WITH PROPOFOL;  Surgeon: Garlan Fair, MD;  Location: [facility];  Service: Endoscopy;  Laterality: N/A;  . MOUTH SURGERY  01/2012   bone graft in mouth  . NASAL SINUS SURGERY Right 06/12/2014   Procedure: RIGHT ENDOSCOPIC MAXILLARY ANTROSTOMY;  Surgeon: Ascencion Dike, MD;  Location: [facility];  Service: ENT;  Laterality: Right;  . ROOT CANAL  02/27/2012  . TONSILLECTOMY  1962    Social History   Socioeconomic History  . Marital status: Divorced    Spouse name: Not on file  . Number of children: 0   . Years of education: Not on file  . Highest education level: Not on file  Occupational History    Comment: Career and life coach  Tobacco Use  . Smoking status: Never Smoker  . Smokeless tobacco: Never Used  Substance and Sexual Activity  . Alcohol use: No    Alcohol/week: 0.0 standard drinks  . Drug use: No  . Sexual activity: Not on file  Other Topics Concern  . Not on file  Social History Narrative   Patient is single and lives alone.   Patient is self-employed, career and life coaching.   Patient drinks two to four cups of caffeine daily.   Social Determinants of Health   Financial Resource Strain:   . Difficulty of Paying Living Expenses: Not on file  Food Insecurity:   . Worried About Charity fundraiser in the Last Year: Not on file  . Ran Out of Food in the Last Year: Not on file  Transportation Needs:   . Lack of Transportation (Medical): Not on file  . Lack of Transportation (Non-Medical): Not on file  Physical Activity:   . Days of Exercise per Week: Not on file  . Minutes of Exercise per Session: Not on file  Stress:   . Feeling of Stress : Not on file  Social Connections:   . Frequency of Communication with Friends and Family: Not on file  . Frequency of Social Gatherings with Friends and Family: Not on file  . Attends Religious Services: Not on file  . Active Member of Clubs or Organizations: Not on file  . Attends Archivist Meetings: Not on file  . Marital Status: Not on file  Intimate Partner Violence:   . Fear of Current or Ex-Partner: Not on file  . Emotionally Abused: Not on file  . Physically Abused: Not on file  . Sexually Abused: Not on file    Family History  Problem Relation Age of Onset  . Hyperlipidemia Father   . Heart failure Father   . Prostate cancer Father   . Kidney failure Father   . Hypertension Father   . Angina Mother   . Hypertension Mother   . Lung cancer Mother   . Colon cancer Mother   . Lung cancer  Paternal Grandfather   . Breast cancer Paternal Grandmother   . Diabetes Paternal Grandmother   . Esophageal cancer Neg Hx   . Liver cancer Neg Hx   . Pancreatic cancer Neg Hx   . Rectal cancer Neg Hx   . Stomach cancer Neg Hx     ROS: no fevers or chills, productive cough, hemoptysis, dysphasia, odynophagia, melena, hematochezia, dysuria, hematuria, rash, seizure  activity, orthopnea, PND, pedal edema, claudication. Remaining systems are negative.  Physical Exam: Well-developed well-nourished in no acute distress.  Skin is warm and dry.  HEENT is normal.  Neck is supple.  Chest is clear to auscultation with normal expansion.  Cardiovascular exam is regular rate and rhythm.  Abdominal exam nontender or distended. No masses palpated. Extremities show no edema. neuro grossly intact   A/P  1 Aneurysmal dilatation of interventricular septum extending into right ventricular outflow tract-previous evaluation showed no VSD and no sinus of Valsalva aneurysm.  2 orthostatic hypotension-symptoms are reasonably well controlled.  We again discussed the importance of increasing fluid intake.  3 hyperlipidemia-given recent documentation of aortic atherosclerosis I recommended statin therapy as her LDL is 131.  However she declined at this point preferring to try diet.  Note her HDL was 99.  We will consider therapy in the future if needed.  4 chest pain-patient has had no recurrent symptoms.  Previous calcium score was 0.  No plans for further ischemia evaluation.  Kirk Ruths, MD

## 2019-09-01 DIAGNOSIS — I700 Atherosclerosis of aorta: Secondary | ICD-10-CM | POA: Diagnosis not present

## 2019-09-01 DIAGNOSIS — L111 Transient acantholytic dermatosis [Grover]: Secondary | ICD-10-CM | POA: Diagnosis not present

## 2019-09-01 DIAGNOSIS — B078 Other viral warts: Secondary | ICD-10-CM | POA: Diagnosis not present

## 2019-09-01 DIAGNOSIS — Z8719 Personal history of other diseases of the digestive system: Secondary | ICD-10-CM | POA: Diagnosis not present

## 2019-09-01 DIAGNOSIS — K862 Cyst of pancreas: Secondary | ICD-10-CM | POA: Diagnosis not present

## 2019-09-01 DIAGNOSIS — L814 Other melanin hyperpigmentation: Secondary | ICD-10-CM | POA: Diagnosis not present

## 2019-09-01 DIAGNOSIS — L905 Scar conditions and fibrosis of skin: Secondary | ICD-10-CM | POA: Diagnosis not present

## 2019-09-01 DIAGNOSIS — D510 Vitamin B12 deficiency anemia due to intrinsic factor deficiency: Secondary | ICD-10-CM | POA: Diagnosis not present

## 2019-09-01 DIAGNOSIS — B379 Candidiasis, unspecified: Secondary | ICD-10-CM | POA: Diagnosis not present

## 2019-09-01 DIAGNOSIS — L650 Telogen effluvium: Secondary | ICD-10-CM | POA: Diagnosis not present

## 2019-09-05 ENCOUNTER — Ambulatory Visit: Payer: BC Managed Care – PPO | Admitting: Cardiology

## 2019-09-05 ENCOUNTER — Other Ambulatory Visit: Payer: Self-pay

## 2019-09-05 ENCOUNTER — Encounter: Payer: Self-pay | Admitting: Cardiology

## 2019-09-05 VITALS — BP 104/64 | HR 61 | Ht 69.25 in | Wt 131.2 lb

## 2019-09-05 DIAGNOSIS — I951 Orthostatic hypotension: Secondary | ICD-10-CM

## 2019-09-05 DIAGNOSIS — R072 Precordial pain: Secondary | ICD-10-CM | POA: Diagnosis not present

## 2019-09-05 DIAGNOSIS — R002 Palpitations: Secondary | ICD-10-CM

## 2019-09-05 DIAGNOSIS — E7800 Pure hypercholesterolemia, unspecified: Secondary | ICD-10-CM | POA: Diagnosis not present

## 2019-09-05 NOTE — Patient Instructions (Signed)
Medication Instructions:  NO CHANGE *If you need a refill on your cardiac medications before your next appointment, please call your pharmacy*  Lab Work: If you have labs (blood work) drawn today and your tests are completely normal, you will receive your results only by: [name] MyChart Message (if you have MyChart) OR . A paper copy in the mail If you have any lab test that is abnormal or we need to change your treatment, we will call you to review the results.  Follow-Up: At [facility], you and your health needs are our priority.  As part of our continuing mission to provide you with exceptional heart care, we have created designated Provider Care Teams.  These Care Teams include your primary Cardiologist (physician) and Advanced Practice Providers (APPs -  Physician Assistants and Nurse Practitioners) who all work together to provide you with the care you need, when you need it.  Your next appointment:   AS SCHEDULED  The format for your next appointment:   Either In Person or Virtual  Provider:   You may see Kirk Ruths MD or one of the following Advanced Practice Providers on your designated Care Team:    Kerin Ransom, PA-C  [place], [place]  Coletta Memos, [place]

## 2019-09-06 DIAGNOSIS — F4322 Adjustment disorder with anxiety: Secondary | ICD-10-CM | POA: Diagnosis not present

## 2019-09-09 DIAGNOSIS — M62838 Other muscle spasm: Secondary | ICD-10-CM | POA: Diagnosis not present

## 2019-09-09 DIAGNOSIS — M6289 Other specified disorders of muscle: Secondary | ICD-10-CM | POA: Diagnosis not present

## 2019-09-09 DIAGNOSIS — M6281 Muscle weakness (generalized): Secondary | ICD-10-CM | POA: Diagnosis not present

## 2019-09-09 DIAGNOSIS — N3020 Other chronic cystitis without hematuria: Secondary | ICD-10-CM | POA: Diagnosis not present

## 2019-09-13 ENCOUNTER — Encounter: Payer: BC Managed Care – PPO | Admitting: Internal Medicine

## 2019-09-13 DIAGNOSIS — D485 Neoplasm of uncertain behavior of skin: Secondary | ICD-10-CM | POA: Diagnosis not present

## 2019-09-13 DIAGNOSIS — R102 Pelvic and perineal pain: Secondary | ICD-10-CM | POA: Diagnosis not present

## 2019-09-13 DIAGNOSIS — K5900 Constipation, unspecified: Secondary | ICD-10-CM | POA: Diagnosis not present

## 2019-09-13 DIAGNOSIS — M6281 Muscle weakness (generalized): Secondary | ICD-10-CM | POA: Diagnosis not present

## 2019-09-13 DIAGNOSIS — L820 Inflamed seborrheic keratosis: Secondary | ICD-10-CM | POA: Diagnosis not present

## 2019-09-13 DIAGNOSIS — M62838 Other muscle spasm: Secondary | ICD-10-CM | POA: Diagnosis not present

## 2019-09-14 DIAGNOSIS — F4322 Adjustment disorder with anxiety: Secondary | ICD-10-CM | POA: Diagnosis not present

## 2019-09-16 ENCOUNTER — Ambulatory Visit: Payer: BC Managed Care – PPO | Attending: Internal Medicine

## 2019-09-16 DIAGNOSIS — Z20822 Contact with and (suspected) exposure to covid-19: Secondary | ICD-10-CM | POA: Diagnosis not present

## 2019-09-16 DIAGNOSIS — M6281 Muscle weakness (generalized): Secondary | ICD-10-CM | POA: Diagnosis not present

## 2019-09-16 DIAGNOSIS — M6289 Other specified disorders of muscle: Secondary | ICD-10-CM | POA: Diagnosis not present

## 2019-09-16 DIAGNOSIS — K5900 Constipation, unspecified: Secondary | ICD-10-CM | POA: Diagnosis not present

## 2019-09-16 DIAGNOSIS — M62838 Other muscle spasm: Secondary | ICD-10-CM | POA: Diagnosis not present

## 2019-09-18 LAB — NOVEL CORONAVIRUS, NAA: SARS-CoV-2, NAA: NOT DETECTED

## 2019-09-19 DIAGNOSIS — M542 Cervicalgia: Secondary | ICD-10-CM | POA: Diagnosis not present

## 2019-09-19 DIAGNOSIS — M25511 Pain in right shoulder: Secondary | ICD-10-CM | POA: Diagnosis not present

## 2019-09-21 DIAGNOSIS — F4322 Adjustment disorder with anxiety: Secondary | ICD-10-CM | POA: Diagnosis not present

## 2019-09-28 DIAGNOSIS — F4322 Adjustment disorder with anxiety: Secondary | ICD-10-CM | POA: Diagnosis not present

## 2019-09-28 DIAGNOSIS — B882 Other arthropod infestations: Secondary | ICD-10-CM | POA: Diagnosis not present

## 2019-09-28 DIAGNOSIS — R5381 Other malaise: Secondary | ICD-10-CM | POA: Diagnosis not present

## 2019-09-28 DIAGNOSIS — R5383 Other fatigue: Secondary | ICD-10-CM | POA: Diagnosis not present

## 2019-09-28 DIAGNOSIS — K5900 Constipation, unspecified: Secondary | ICD-10-CM | POA: Diagnosis not present

## 2019-10-04 DIAGNOSIS — R102 Pelvic and perineal pain: Secondary | ICD-10-CM | POA: Diagnosis not present

## 2019-10-04 DIAGNOSIS — R351 Nocturia: Secondary | ICD-10-CM | POA: Diagnosis not present

## 2019-10-04 DIAGNOSIS — N3020 Other chronic cystitis without hematuria: Secondary | ICD-10-CM | POA: Diagnosis not present

## 2019-10-04 DIAGNOSIS — M6281 Muscle weakness (generalized): Secondary | ICD-10-CM | POA: Diagnosis not present

## 2019-10-05 DIAGNOSIS — B882 Other arthropod infestations: Secondary | ICD-10-CM | POA: Diagnosis not present

## 2019-10-05 DIAGNOSIS — R5383 Other fatigue: Secondary | ICD-10-CM | POA: Diagnosis not present

## 2019-10-05 DIAGNOSIS — B1081 Human herpesvirus 6 infection: Secondary | ICD-10-CM | POA: Diagnosis not present

## 2019-10-05 DIAGNOSIS — R5381 Other malaise: Secondary | ICD-10-CM | POA: Diagnosis not present

## 2019-10-06 DIAGNOSIS — F4322 Adjustment disorder with anxiety: Secondary | ICD-10-CM | POA: Diagnosis not present

## 2019-10-11 ENCOUNTER — Ambulatory Visit: Payer: BC Managed Care – PPO | Admitting: Internal Medicine

## 2019-10-11 ENCOUNTER — Encounter: Payer: Self-pay | Admitting: Internal Medicine

## 2019-10-11 ENCOUNTER — Other Ambulatory Visit: Payer: Self-pay

## 2019-10-11 VITALS — BP 113/71 | HR 60 | Wt 129.0 lb

## 2019-10-11 DIAGNOSIS — R5382 Chronic fatigue, unspecified: Secondary | ICD-10-CM

## 2019-10-11 NOTE — Progress Notes (Signed)
RFV: chronic lyme   Patient ID: Megan [name], female   DOB: July 21, 1956, 64 y.o.   MRN: [id number]  HPI Megan [name] is a a 64yo F with history of hypogammaglobunemia, peripheral neuropathy leukopenia and long standing Fatigue, weakness, peripheral neuropathy of LE for roughly 10 years. Has no recollection of   specific tickbite but thought she had leukopenia in late 30s, for unclear reasons. She has been followed by general neurologist, and MS specialist for eval of peripheral neuropathy, has been ruled out for MS. She has had NCS that didn't show any abn to long/short fibers but has not had muscle biopsy. She sees them yearly, through [facility].   She is seeing a providers from the [facility]  - who have done extensive testing showing IgG + to lyme (as well as HHV6,HHV7,  and IgM for bartonella and HGA via [facility] tickborne summary panel. Interestingly the lab reports state that IDSA/CDC definition of lyme disease is negative.  Components:  VLsE1, C6peptice, p30, OspA, bmpA, p66. Extract 297 = were positive  Previously lived in [place] area, moved away from [place] in 2009, then [place] briefly. Never treated for doxycycline.  She has seen rheumatology that ruled out lupus and hepatitis  She is currently receiving dietary supplements for lyme disease from [facility], not tolerating dosage prescribed, in addition, giving her ozone therapy? For sinus pressure  ROS: Feels that something is having sinus pressure (despite seeing ent, ophtho, and dentist) she had "thermography" heat scan that "head" is abnormal.  Outpatient Encounter Medications as of 10/11/2019  Medication Sig  . 5-Hydroxytryptophan (5-HTP) 100 MG CAPS Take 100 mg by mouth daily.   . Alpha-Lipoic Acid 600 MG TABS Take 600 mg by mouth daily.  . AMINO ACIDS COMPLEX PO Take 1 Scoop by mouth daily.   Francia Greaves THYROID 30 MG tablet Take 30 mg by mouth daily.  . Ascorbic Acid (VITAMIN C  PO) Take 2,500 Units by mouth daily.   . B Complex Vitamins (VITAMIN B COMPLEX PO) Take 1 tablet by mouth daily.  . Cholecalciferol (VITAMIN D3) 10000 units capsule Take 10,000 Units by mouth daily.   . Digestive Enzymes (DIGESTIVE ENZYME PO) Take 1 tablet by mouth daily.  [name] GLUTATHIONE PO Take 250 mg by mouth daily.  [name] MAGNESIUM CITRATE PO Take 800 mg by mouth daily.   . Menaquinone-7 (VITAMIN K2 PO) Take 1 tablet by mouth daily.  . Omega-3 Fatty Acids (FISH OIL) 1000 MG CAPS Take 1,000 mg by mouth daily.  [name] zinc gluconate 50 MG tablet Take 50 mg by mouth daily.  . Cyanocobalamin (VITAMIN B-12 ER PO) Take 5,000 mcg by mouth daily.   . magic mouthwash w/lidocaine SOLN Take 5-10 mLs by mouth 3 (three) times daily as needed for mouth pain. (Patient not taking: Reported on 10/11/2019)  . ondansetron (ZOFRAN) 4 MG tablet Take 1 tablet (4 mg total) by mouth every 6 (six) hours. (Patient not taking: Reported on 10/11/2019)   No facility-administered encounter medications on file as of 10/11/2019.     Patient Active Problem List   Diagnosis Date Noted  . Iron deficiency anemia 07/14/2019  . Vitamin D deficiency, unspecified 07/30/2018  . Age-related osteoporosis without current pathological fracture 07/30/2018  . Estrogen excess 06/18/2018  . Hypogammaglobulinemia ([place]) 01/11/2018  . Right hip pain 12/30/2017  . Bilateral ankle pain 11/09/2017  . Hypothyroidism 10/27/2017  . Hair loss 04/25/2017  . Neuropathic pain of left lower extremity 04/22/2017  . [employer]  toe pain, left 04/10/2017  . Precordial chest pain 12/17/2016  . Aneurysm of right ventricle of heart 12/17/2016  . Chronic fatigue 03/05/2016  . Neuropathy associated with MGUS ([place]) 12/26/2013  . Orthostatic hypotension 08/01/2013  . Mitral valve prolapse 08/01/2013  . Spider veins of limb 02/15/2013  . Leukopenia 05/13/2012  . Bilateral Dorsal Foot pain 04/23/2011  . Gait abnormality 04/23/2011  . Leg length inequality 04/23/2011    . ASTHMA 11/16/2008  . Nonspecific (abnormal) findings on radiological and other examination of body structure 07/24/2008  . ABNORMAL CHEST XRAY 07/24/2008     Health Maintenance Due  Topic Date Due  . MAMMOGRAM  02/07/2005  . INFLUENZA VACCINE  03/26/2019    Social History   Tobacco Use  . Smoking status: Never Smoker  . Smokeless tobacco: Never Used  Substance Use Topics  . Alcohol use: No    Alcohol/week: 0.0 standard drinks  . Drug use: No  family history includes Angina in her mother; Breast cancer in her paternal grandmother; Colon cancer in her mother; Diabetes in her paternal grandmother; Heart failure in her father; Hyperlipidemia in her father; Hypertension in her father and mother; Kidney failure in her father; Lung cancer in her mother and paternal grandfather; Prostate cancer in her father.  Review of Systems 12 point ros is negative except what is mentioned above in hpi Physical Exam   BP 113/71   Pulse 60   Wt 129 lb (58.5 kg)   BMI 18.91 kg/m   gen = a xo by 3 in NAD, thin appearance HEENT = EOMI, PERRLA, clear OP Neuro = CN2-12 intact, no focal deficits,  CBC Lab Results  Component Value Date   WBC 3.4 (L) 07/20/2019   RBC 4.63 07/20/2019   HGB 14.3 07/20/2019   HCT 43.3 07/20/2019   PLT 216 07/20/2019   MCV 93.5 07/20/2019   MCH 30.9 07/20/2019   MCHC 33.0 07/20/2019   RDW 12.4 07/20/2019   LYMPHSABS 1.4 07/20/2019   MONOABS 0.3 07/20/2019   EOSABS 0.0 07/20/2019    BMET Lab Results  Component Value Date   NA 140 07/20/2019   K 3.8 07/20/2019   CL 106 07/20/2019   CO2 26 07/20/2019   GLUCOSE 87 07/20/2019   BUN 13 07/20/2019   CREATININE 0.64 07/20/2019   CALCIUM 9.5 07/20/2019   GFRNONAA >60 07/20/2019   GFRAA >60 07/20/2019     Assessment and Plan  Questionable lyme disease = has alternative lab results suggesting she has hx of lyme disease, and mentioned that this assay is different than what we have used for diagnosis of  lyme disease. Also mentioned to her that there is no utility for antibiotics for now since this is past disease. If it is chronic lyme disease, did mention that there is a constellation of symptoms that some with lyme disease have chronic sequelae. Recommended that she stopsTaking cumanda, banderol, samento dietary supplementation ( 20drops twice a day) by [facility], but she had significant side effects.  Even if she carries diagnosis of chronic lyme disease, still not sure what we could offer in terms of treatment. Will review the lab testing source and discuss if there are any further work ups needed that have not already been done. Will do phone interview in 1 week.  Nasal ozone for nasal congestion of unknown utility  Spent 45 min with patient discussing diagnosis and treatment option

## 2019-10-12 DIAGNOSIS — F4322 Adjustment disorder with anxiety: Secondary | ICD-10-CM | POA: Diagnosis not present

## 2019-10-17 DIAGNOSIS — M542 Cervicalgia: Secondary | ICD-10-CM | POA: Diagnosis not present

## 2019-10-17 DIAGNOSIS — F4322 Adjustment disorder with anxiety: Secondary | ICD-10-CM | POA: Diagnosis not present

## 2019-10-17 DIAGNOSIS — M25511 Pain in right shoulder: Secondary | ICD-10-CM | POA: Diagnosis not present

## 2019-10-24 DIAGNOSIS — A6920 Lyme disease, unspecified: Secondary | ICD-10-CM | POA: Insufficient documentation

## 2019-10-25 DIAGNOSIS — R5383 Other fatigue: Secondary | ICD-10-CM | POA: Diagnosis not present

## 2019-10-25 DIAGNOSIS — N951 Menopausal and female climacteric states: Secondary | ICD-10-CM | POA: Diagnosis not present

## 2019-10-25 DIAGNOSIS — R5381 Other malaise: Secondary | ICD-10-CM | POA: Diagnosis not present

## 2019-10-25 DIAGNOSIS — A4901 Methicillin susceptible Staphylococcus aureus infection, unspecified site: Secondary | ICD-10-CM | POA: Diagnosis not present

## 2019-10-25 DIAGNOSIS — B872 Ocular myiasis: Secondary | ICD-10-CM | POA: Diagnosis not present

## 2019-10-26 DIAGNOSIS — R351 Nocturia: Secondary | ICD-10-CM | POA: Diagnosis not present

## 2019-10-26 DIAGNOSIS — M6289 Other specified disorders of muscle: Secondary | ICD-10-CM | POA: Diagnosis not present

## 2019-10-26 DIAGNOSIS — M6281 Muscle weakness (generalized): Secondary | ICD-10-CM | POA: Diagnosis not present

## 2019-10-26 DIAGNOSIS — M62838 Other muscle spasm: Secondary | ICD-10-CM | POA: Diagnosis not present

## 2019-10-31 DIAGNOSIS — F4322 Adjustment disorder with anxiety: Secondary | ICD-10-CM | POA: Diagnosis not present

## 2019-11-02 DIAGNOSIS — M792 Neuralgia and neuritis, unspecified: Secondary | ICD-10-CM | POA: Diagnosis not present

## 2019-11-03 DIAGNOSIS — N926 Irregular menstruation, unspecified: Secondary | ICD-10-CM | POA: Diagnosis not present

## 2019-11-04 ENCOUNTER — Other Ambulatory Visit: Payer: BC Managed Care – PPO

## 2019-11-07 ENCOUNTER — Other Ambulatory Visit: Payer: Self-pay

## 2019-11-07 ENCOUNTER — Ambulatory Visit (INDEPENDENT_AMBULATORY_CARE_PROVIDER_SITE_OTHER): Payer: BC Managed Care – PPO | Admitting: Internal Medicine

## 2019-11-07 DIAGNOSIS — J320 Chronic maxillary sinusitis: Secondary | ICD-10-CM | POA: Diagnosis not present

## 2019-11-07 DIAGNOSIS — R5382 Chronic fatigue, unspecified: Secondary | ICD-10-CM

## 2019-11-07 DIAGNOSIS — J310 Chronic rhinitis: Secondary | ICD-10-CM | POA: Diagnosis not present

## 2019-11-07 DIAGNOSIS — G501 Atypical facial pain: Secondary | ICD-10-CM | POA: Diagnosis not present

## 2019-11-07 NOTE — Progress Notes (Signed)
Virtual Visit via Telephone Note  I connected with Megan [name] on 11/07/19 at  9:00 AM EDT by telephone and verified that I am speaking with the correct person using two identifiers.  Location: Patient: Megan [name]- at home Provider: Caren Griffins Vinson Tietze- at clinic   I discussed the limitations, risks, security and privacy concerns of performing an evaluation and management service by telephone and the availability of in person appointments. I also discussed with the patient that there may be a patient responsible charge related to this service. The patient expressed understanding and agreed to proceed.   History of Present Illness:  Still ongoing, chronic fatigue - wants confirmation of having lyme disease.   She reached out to [facility], [facility], and [facility] - no chronic lyme clinics. Did mention that she may consider looking at multidisciplinary lyme clinics at academic institutions but likely will be out of network.   Previously on anti deppressant- 28yrs ago. Off for the last 5 yrs. Her body doesn't feel well. "no gas in the tank" but doesn't feel depressed  Observations/Objective:   Assessment and Plan: - chronic lyme diagnosis = will check western blot to see if she has hx of lyme IgG antibodies. Her previous tests [facility] through [facility], and unsure those results were FDA approved  - last choice - to do antidepressant - she will think about it. Follow Up Instructions:    I discussed the assessment and treatment plan with the patient. The patient was provided an opportunity to ask questions and all were answered. The patient agreed with the plan and demonstrated an understanding of the instructions.   The patient was advised to call back or seek an in-person evaluation if the symptoms worsen or if the condition fails to improve as anticipated.  I provided 20 minutes of non-face-to-face time during this encounter.   Carlyle Basques, MD

## 2019-11-08 ENCOUNTER — Other Ambulatory Visit: Payer: Self-pay

## 2019-11-08 ENCOUNTER — Other Ambulatory Visit: Payer: BC Managed Care – PPO

## 2019-11-08 DIAGNOSIS — R5382 Chronic fatigue, unspecified: Secondary | ICD-10-CM

## 2019-11-08 DIAGNOSIS — F4322 Adjustment disorder with anxiety: Secondary | ICD-10-CM | POA: Diagnosis not present

## 2019-11-09 ENCOUNTER — Encounter: Payer: Self-pay | Admitting: Neurology

## 2019-11-09 DIAGNOSIS — E559 Vitamin D deficiency, unspecified: Secondary | ICD-10-CM | POA: Diagnosis not present

## 2019-11-09 DIAGNOSIS — M810 Age-related osteoporosis without current pathological fracture: Secondary | ICD-10-CM | POA: Diagnosis not present

## 2019-11-10 LAB — B. BURGDORFI ANTIBODIES BY WB
B burgdorferi IgG Abs (IB): NEGATIVE
B burgdorferi IgM Abs (IB): NEGATIVE
Lyme Disease 18 kD IgG: NONREACTIVE
Lyme Disease 23 kD IgG: NONREACTIVE
Lyme Disease 23 kD IgM: NONREACTIVE
Lyme Disease 28 kD IgG: NONREACTIVE
Lyme Disease 30 kD IgG: NONREACTIVE
Lyme Disease 39 kD IgG: NONREACTIVE
Lyme Disease 39 kD IgM: NONREACTIVE
Lyme Disease 41 kD IgG: REACTIVE — AB
Lyme Disease 41 kD IgM: NONREACTIVE
Lyme Disease 45 kD IgG: NONREACTIVE
Lyme Disease 58 kD IgG: NONREACTIVE
Lyme Disease 66 kD IgG: NONREACTIVE
Lyme Disease 93 kD IgG: NONREACTIVE

## 2019-11-16 DIAGNOSIS — F4322 Adjustment disorder with anxiety: Secondary | ICD-10-CM | POA: Diagnosis not present

## 2019-11-17 ENCOUNTER — Encounter: Payer: Self-pay | Admitting: Hematology

## 2019-11-21 ENCOUNTER — Inpatient Hospital Stay: Payer: BC Managed Care – PPO | Attending: Hematology

## 2019-11-21 ENCOUNTER — Other Ambulatory Visit: Payer: Self-pay

## 2019-11-21 DIAGNOSIS — D510 Vitamin B12 deficiency anemia due to intrinsic factor deficiency: Secondary | ICD-10-CM | POA: Diagnosis not present

## 2019-11-21 DIAGNOSIS — D72819 Decreased white blood cell count, unspecified: Secondary | ICD-10-CM

## 2019-11-21 LAB — VITAMIN B12: Vitamin B-12: 2394 pg/mL — ABNORMAL HIGH (ref 180–914)

## 2019-11-21 LAB — CBC WITH DIFFERENTIAL/PLATELET
Abs Immature Granulocytes: 0.01 10*3/uL (ref 0.00–0.07)
Basophils Absolute: 0 10*3/uL (ref 0.0–0.1)
Basophils Relative: 1 %
Eosinophils Absolute: 0 10*3/uL (ref 0.0–0.5)
Eosinophils Relative: 1 %
HCT: 41.7 % (ref 36.0–46.0)
Hemoglobin: 13.9 g/dL (ref 12.0–15.0)
Immature Granulocytes: 0 %
Lymphocytes Relative: 37 %
Lymphs Abs: 1.2 10*3/uL (ref 0.7–4.0)
MCH: 30.9 pg (ref 26.0–34.0)
MCHC: 33.3 g/dL (ref 30.0–36.0)
MCV: 92.7 fL (ref 80.0–100.0)
Monocytes Absolute: 0.2 10*3/uL (ref 0.1–1.0)
Monocytes Relative: 7 %
Neutro Abs: 1.7 10*3/uL (ref 1.7–7.7)
Neutrophils Relative %: 54 %
Platelets: 187 10*3/uL (ref 150–400)
RBC: 4.5 MIL/uL (ref 3.87–5.11)
RDW: 12 % (ref 11.5–15.5)
WBC: 3.2 10*3/uL — ABNORMAL LOW (ref 4.0–10.5)
nRBC: 0 % (ref 0.0–0.2)

## 2019-11-21 LAB — FERRITIN: Ferritin: 134 ng/mL (ref 11–307)

## 2019-11-21 LAB — IRON AND TIBC
Iron: 104 ug/dL (ref 41–142)
Saturation Ratios: 35 % (ref 21–57)
TIBC: 295 ug/dL (ref 236–444)
UIBC: 191 ug/dL (ref 120–384)

## 2019-11-22 DIAGNOSIS — R5383 Other fatigue: Secondary | ICD-10-CM | POA: Diagnosis not present

## 2019-11-22 DIAGNOSIS — R5381 Other malaise: Secondary | ICD-10-CM | POA: Diagnosis not present

## 2019-11-22 DIAGNOSIS — B882 Other arthropod infestations: Secondary | ICD-10-CM | POA: Diagnosis not present

## 2019-11-22 DIAGNOSIS — N951 Menopausal and female climacteric states: Secondary | ICD-10-CM | POA: Diagnosis not present

## 2019-11-23 DIAGNOSIS — F4322 Adjustment disorder with anxiety: Secondary | ICD-10-CM | POA: Diagnosis not present

## 2019-11-23 NOTE — Telephone Encounter (Signed)
Can try dilt gel 2% BID to TID for probable anal fissure assoc with constipation/hard stols.  Add RectiCare per box instructions for pain APP visit scheduled

## 2019-11-29 DIAGNOSIS — M79605 Pain in left leg: Secondary | ICD-10-CM | POA: Diagnosis not present

## 2019-11-29 DIAGNOSIS — G609 Hereditary and idiopathic neuropathy, unspecified: Secondary | ICD-10-CM | POA: Diagnosis not present

## 2019-11-29 DIAGNOSIS — M3500 Sicca syndrome, unspecified: Secondary | ICD-10-CM | POA: Diagnosis not present

## 2019-11-29 DIAGNOSIS — M79604 Pain in right leg: Secondary | ICD-10-CM | POA: Diagnosis not present

## 2019-11-30 DIAGNOSIS — F4322 Adjustment disorder with anxiety: Secondary | ICD-10-CM | POA: Diagnosis not present

## 2019-12-01 ENCOUNTER — Ambulatory Visit: Payer: BC Managed Care – PPO | Admitting: Physician Assistant

## 2019-12-01 ENCOUNTER — Other Ambulatory Visit: Payer: BC Managed Care – PPO

## 2019-12-01 ENCOUNTER — Encounter: Payer: Self-pay | Admitting: Physician Assistant

## 2019-12-01 VITALS — BP 102/64 | HR 65 | Temp 98.7°F | Ht 69.0 in | Wt 123.0 lb

## 2019-12-01 DIAGNOSIS — K5909 Other constipation: Secondary | ICD-10-CM

## 2019-12-01 DIAGNOSIS — D510 Vitamin B12 deficiency anemia due to intrinsic factor deficiency: Secondary | ICD-10-CM | POA: Diagnosis not present

## 2019-12-01 DIAGNOSIS — K625 Hemorrhage of anus and rectum: Secondary | ICD-10-CM

## 2019-12-01 DIAGNOSIS — M25571 Pain in right ankle and joints of right foot: Secondary | ICD-10-CM | POA: Diagnosis not present

## 2019-12-01 DIAGNOSIS — K862 Cyst of pancreas: Secondary | ICD-10-CM | POA: Diagnosis not present

## 2019-12-01 NOTE — Progress Notes (Signed)
Chief Complaint: Rectal bleeding, constipation, pancreatic cyst  HPI:    Megan [name] is a 64 year old Caucasian female with a past medical history as listed below including chronic constipation/colonic inertia, family history of colon cancer in her mother, pernicious anemia and others, known to Dr. Hilarie Fredrickson, who was referred to me by Lavone Orn, MD for a complaint of rectal bleeding, constipation and pancreatic cyst.      08/16/2019 patient seen in clinic by Dr. Elmo Putt.  At that time she had developed some uncomfortable pelvic pain.  She had had a CT in the ER which showed an incidental pancreatic cyst.  I recently completed SIBO breath testing which was negative.  She continued to have issues with constipation.  Described at that time that Prozac seem to help her constipation years ago.  But did not wish to use an SSRI going forward.  At that time was thought many of her symptoms are related to her constipation.  She was given a MiraLAX purge and she was starting an herbal motility activator before trying a pharmacologic medicine such as Linzess, Amitiza, Trulance or Motegrity.  Also discussed pernicious anemia and an upper endoscopy to evaluate for gastritis but also a small bowel biopsy to exclude celiac disease.  She was up-to-date with screening colonoscopy last in July 2019 repeat recommended in July 2024.  Pancreatic cyst was discussed it was recommended she have an MRI abdomen with and without contrast with pancreatic protocol plus MRCP in May.    Today, the patient tells me that she has continued with her constipation though this is minimally better with the use of magnesium.  Tells me after seeing Dr. Hilarie Fredrickson she tried to do a MiraLAX purge but nothing ever happened.  Tells me that most recently she had about a week of diarrhea "or at least softer stool" and this was associated with a 10 pound weight loss.  She then went back to being constipated.  Tells me that often she has to press vaginally with  her finger to help evacuate her bowels which seemed to "get hung up there".  Tells me that she did this when she became constipated and it hurt and she saw some blood on the toilet paper.  She continued with some rectal pain for a few days but has seen no further bleeding and now the pain is completely gone over the past couple of weeks.  Patient never started the Diltiazem or RectiCare as this recommendation was about a week after her initial symptoms and by then they were better.  Again tells me that she has not fully researched the pharmacologic agents that we could use to help with her constipation she would like to do so before starting with them.  She does follow with a holistic doctor.  Does have lower abdominal pain related to constipation.    Patient also has questions about scheduling an MRI before her scheduled appointment with Dr. Hilarie Fredrickson in the end of May.    Denies fever, chills, continued blood in her stool, weight loss or symptoms that awaken her from sleep.  Past Medical History:  Diagnosis Date  . Allergic rhinitis   . Allergy   . Aneurysm of cardiac wall, congenital    a.  septal aneurysm extending into the RVOT (by cardiac MRI in 08/2013)  . Anxiety   . Aortic atherosclerosis ([place])   . Arthritis   . Asthma   . Basal cell carcinoma   . Depression   . Heart murmur   .  Hypothyroidism   . Leukopenia    idiopathic  . Lyme disease   . Mitral valve prolapse   . MVP (mitral valve prolapse)   . Orthostatic hypotension   . Osteoporosis   . Pancreatic cyst   . Peripheral neuropathy 06/09/2012  . Pernicious anemia   . Pernicious anemia   . Plantar fasciitis   . Pneumothorax     Past Surgical History:  Procedure Laterality Date  . COLONOSCOPY WITH PROPOFOL N/A 02/22/2013   Procedure: COLONOSCOPY WITH PROPOFOL;  Surgeon: Garlan Fair, MD;  Location: [facility];  Service: Endoscopy;  Laterality: N/A;  . MOUTH SURGERY  01/2012   bone graft in mouth  . NASAL SINUS SURGERY  Right 06/12/2014   Procedure: RIGHT ENDOSCOPIC MAXILLARY ANTROSTOMY;  Surgeon: Ascencion Dike, MD;  Location: [facility];  Service: ENT;  Laterality: Right;  . ROOT CANAL  02/27/2012  . TONSILLECTOMY  1962    Current Outpatient Medications  Medication Sig Dispense Refill  . 5-Hydroxytryptophan (5-HTP) 100 MG CAPS Take 100 mg by mouth daily.     . Alpha-Lipoic Acid 600 MG TABS Take 600 mg by mouth daily.    . AMINO ACIDS COMPLEX PO Take 1 Scoop by mouth daily.     Francia Greaves THYROID 30 MG tablet Take 30 mg by mouth daily.  6  . Ascorbic Acid (VITAMIN C PO) Take 2,500 Units by mouth daily.     . Cholecalciferol (VITAMIN D3) 10000 units capsule Take 10,000 Units by mouth daily.     . Cyanocobalamin (VITAMIN B-12 ER PO) Take 5,000 mcg by mouth daily.     . Digestive Enzymes (DIGESTIVE ENZYME PO) Take 1 tablet by mouth daily.    [name] GLUTATHIONE PO Take 250 mg by mouth daily.    . Magnesium Ascorbate POWD Take 600 mg by mouth daily.    [name] MAGNESIUM CITRATE PO Take 800 mg by mouth daily.     . Menaquinone-7 (VITAMIN K2 PO) Take 1 tablet by mouth daily.    . Omega-3 Fatty Acids (FISH OIL) 1000 MG CAPS Take 1,000 mg by mouth daily.    [name] zinc gluconate 50 MG tablet Take 50 mg by mouth daily.     No current facility-administered medications for this visit.    Allergies as of 12/01/2019 - Review Complete 12/01/2019  Allergen Reaction Noted  . Augmentin [amoxicillin-pot clavulanate]  06/29/2019  . Clindamycin/lincomycin  06/29/2019    Family History  Problem Relation Age of Onset  . Hyperlipidemia Father   . Heart failure Father   . Prostate cancer Father   . Kidney failure Father   . Hypertension Father   . Angina Mother   . Hypertension Mother   . Lung cancer Mother   . Colon cancer Mother   . Lung cancer Paternal Grandfather   . Breast cancer Paternal Grandmother   . Diabetes Paternal Grandmother   . Esophageal cancer Neg Hx   . Liver cancer Neg Hx   . Pancreatic cancer  Neg Hx   . Rectal cancer Neg Hx   . Stomach cancer Neg Hx     Social History   Socioeconomic History  . Marital status: Divorced    Spouse name: Not on file  . Number of children: 0  . Years of education: Not on file  . Highest education level: Not on file  Occupational History    Comment: Career and life coach  Tobacco Use  . Smoking status: Never Smoker  .  Smokeless tobacco: Never Used  Substance and Sexual Activity  . Alcohol use: No    Alcohol/week: 0.0 standard drinks  . Drug use: No  . Sexual activity: Not on file  Other Topics Concern  . Not on file  Social History Narrative   Patient is single and lives alone.   Patient is self-employed, career and life coaching.   Patient drinks two to four cups of caffeine daily.   Social Determinants of Health   Financial Resource Strain:   . Difficulty of Paying Living Expenses:   Food Insecurity:   . Worried About Charity fundraiser in the Last Year:   . Arboriculturist in the Last Year:   Transportation Needs:   . Film/video editor (Medical):   [name] Lack of Transportation (Non-Medical):   Physical Activity:   . Days of Exercise per Week:   . Minutes of Exercise per Session:   Stress:   . Feeling of Stress :   Social Connections:   . Frequency of Communication with Friends and Family:   . Frequency of Social Gatherings with Friends and Family:   . Attends Religious Services:   . Active Member of Clubs or Organizations:   . Attends Archivist Meetings:   [name] Marital Status:   Intimate Partner Violence:   . Fear of Current or Ex-Partner:   . Emotionally Abused:   [name] Physically Abused:   . Sexually Abused:     Review of Systems:    Constitutional: No weight loss, fever or chills Cardiovascular: No chest pain Respiratory: No SOB  Gastrointestinal: See HPI and otherwise negative   Physical Exam:  Vital signs: BP 102/64   Pulse 65   Temp 98.7 F (37.1 C)   Ht 5\' 9"  (1.753 m)   Wt 123 lb (55.8  kg)   SpO2 98%   BMI 18.16 kg/m   Constitutional:   Pleasant Caucasian female appears to be in NAD, Well developed, Well nourished, alert and cooperative Respiratory: Respirations even and unlabored. Lungs clear to auscultation bilaterally.   No wheezes, crackles, or rhonchi.  Cardiovascular: Normal S1, S2. No MRG. Regular rate and rhythm. No peripheral edema, cyanosis or pallor.  Gastrointestinal:  Soft, nondistended, mild b/l lower abdominal ttp. No rebound or guarding. Normal bowel sounds. No appreciable masses or hepatomegaly. Rectal:  External: no fissure or hemorrhoids; internal: no mass, small rectocele? Psychiatric: Demonstrates good judgement and reason without abnormal affect or behaviors.  No recent labs/imaging.  Assessment: 1.  Chronic constipation: Thought related to decreased colonic inertia, also question small rectocele on exam today, magnesium is somewhat helpful 2.  Pernicious anemia: Confirmed with antiparietal and anti intrinsic factor antibody, continues B12 supplements, upper endoscopy recommended in the future for further eval of gastritis/celiac disease/hyper gastrin status 3.  Pancreatic cyst: Incidentally found on recent CT, recommendations for MRI/MRCP with pancreatic protocol 4.  Rectal bleeding: 1 episode for 2 to 3 days in a row, now completely better, did occur after disimpaction with some rectal pain; most likely fissure  Plan: 1.  Discussed that it is likely that she had a fissure which has now resolved. 2.  Again discussed different pharmacologic agents that she could use for constipation but she would like to research all of these before picking one, explained that likely her pain and discomfort from constipation as well as the fact that sometimes she has to vaginally press for her stool to come out would all be better if she were  able to control constipation. 3.  Scheduled MRI plus MRCP with pancreatic protocol per recommendations from Dr. Hilarie Fredrickson at the  beginning of May.  This will be done by the time she sees him in late May.  They can then discuss further diagnostic procedures if needed. CMP also ordered per his recommendations. 4.  Patient to follow in clinic with [place] as needed before May as scheduled with Dr. Hilarie Fredrickson.  Ellouise Newer, PA-C [facility] 12/01/2019, 1:11 PM  Cc: Lavone Orn, MD

## 2019-12-01 NOTE — Patient Instructions (Signed)
You have been scheduled for an MRI at [facility] (1st floor radiology) on Wednesday 12/28/19. Your appointment time is 9 am. Please arrive 30 minutes prior to your appointment time for registration purposes. Please make certain not to have anything to eat or drink 6 hours prior to your test. In addition, if you have any metal in your body, have a pacemaker or defibrillator, please be sure to let your ordering physician know. This test typically takes 45 minutes to 1 hour to complete. Should you need to reschedule, please call [phone] to do so.  Keep follow up with Dr. Hilarie Fredrickson.   If you are age 72 or older, your body mass index should be between 23-30. Your Body mass index is 18.16 kg/m. If this is out of the aforementioned range listed, please consider follow up with your Primary Care Provider.  If you are age 47 or younger, your body mass index should be between 19-25. Your Body mass index is 18.16 kg/m. If this is out of the aformentioned range listed, please consider follow up with your Primary Care Provider.

## 2019-12-02 DIAGNOSIS — M25572 Pain in left ankle and joints of left foot: Secondary | ICD-10-CM | POA: Insufficient documentation

## 2019-12-02 DIAGNOSIS — S93491A Sprain of other ligament of right ankle, initial encounter: Secondary | ICD-10-CM | POA: Diagnosis not present

## 2019-12-02 DIAGNOSIS — M25571 Pain in right ankle and joints of right foot: Secondary | ICD-10-CM | POA: Insufficient documentation

## 2019-12-05 DIAGNOSIS — R102 Pelvic and perineal pain: Secondary | ICD-10-CM | POA: Diagnosis not present

## 2019-12-05 DIAGNOSIS — R351 Nocturia: Secondary | ICD-10-CM | POA: Diagnosis not present

## 2019-12-05 DIAGNOSIS — N3020 Other chronic cystitis without hematuria: Secondary | ICD-10-CM | POA: Diagnosis not present

## 2019-12-05 DIAGNOSIS — N760 Acute vaginitis: Secondary | ICD-10-CM | POA: Diagnosis not present

## 2019-12-07 DIAGNOSIS — F4322 Adjustment disorder with anxiety: Secondary | ICD-10-CM | POA: Diagnosis not present

## 2019-12-07 NOTE — Progress Notes (Signed)
Addendum: Reviewed and agree with assessment and management plan. Javin Nong M, MD  

## 2019-12-08 DIAGNOSIS — L570 Actinic keratosis: Secondary | ICD-10-CM | POA: Diagnosis not present

## 2019-12-14 DIAGNOSIS — F4322 Adjustment disorder with anxiety: Secondary | ICD-10-CM | POA: Diagnosis not present

## 2019-12-15 DIAGNOSIS — G959 Disease of spinal cord, unspecified: Secondary | ICD-10-CM | POA: Diagnosis not present

## 2019-12-15 DIAGNOSIS — R768 Other specified abnormal immunological findings in serum: Secondary | ICD-10-CM | POA: Diagnosis not present

## 2019-12-16 DIAGNOSIS — M25551 Pain in right hip: Secondary | ICD-10-CM | POA: Diagnosis not present

## 2019-12-16 DIAGNOSIS — M25571 Pain in right ankle and joints of right foot: Secondary | ICD-10-CM | POA: Diagnosis not present

## 2019-12-19 DIAGNOSIS — M8580 Other specified disorders of bone density and structure, unspecified site: Secondary | ICD-10-CM | POA: Diagnosis not present

## 2019-12-19 DIAGNOSIS — Z01419 Encounter for gynecological examination (general) (routine) without abnormal findings: Secondary | ICD-10-CM | POA: Diagnosis not present

## 2019-12-19 DIAGNOSIS — Z681 Body mass index (BMI) 19 or less, adult: Secondary | ICD-10-CM | POA: Diagnosis not present

## 2019-12-20 ENCOUNTER — Other Ambulatory Visit (INDEPENDENT_AMBULATORY_CARE_PROVIDER_SITE_OTHER): Payer: BC Managed Care – PPO

## 2019-12-20 DIAGNOSIS — K862 Cyst of pancreas: Secondary | ICD-10-CM

## 2019-12-20 DIAGNOSIS — K625 Hemorrhage of anus and rectum: Secondary | ICD-10-CM

## 2019-12-20 DIAGNOSIS — K5909 Other constipation: Secondary | ICD-10-CM | POA: Diagnosis not present

## 2019-12-20 DIAGNOSIS — R5383 Other fatigue: Secondary | ICD-10-CM | POA: Diagnosis not present

## 2019-12-20 DIAGNOSIS — G629 Polyneuropathy, unspecified: Secondary | ICD-10-CM | POA: Diagnosis not present

## 2019-12-20 DIAGNOSIS — N951 Menopausal and female climacteric states: Secondary | ICD-10-CM | POA: Diagnosis not present

## 2019-12-20 DIAGNOSIS — R5381 Other malaise: Secondary | ICD-10-CM | POA: Diagnosis not present

## 2019-12-20 LAB — COMPREHENSIVE METABOLIC PANEL
ALT: 18 U/L (ref 0–35)
AST: 24 U/L (ref 0–37)
Albumin: 4.3 g/dL (ref 3.5–5.2)
Alkaline Phosphatase: 79 U/L (ref 39–117)
BUN: 15 mg/dL (ref 6–23)
CO2: 31 mEq/L (ref 19–32)
Calcium: 9.3 mg/dL (ref 8.4–10.5)
Chloride: 105 mEq/L (ref 96–112)
Creatinine, Ser: 0.66 mg/dL (ref 0.40–1.20)
GFR: 89.92 mL/min (ref >60.00)
Glucose, Bld: 79 mg/dL (ref 70–99)
Potassium: 4.1 mEq/L (ref 3.5–5.1)
Sodium: 140 mEq/L (ref 135–145)
Total Bilirubin: 0.6 mg/dL (ref 0.2–1.2)
Total Protein: 6.3 g/dL (ref 6.0–8.3)

## 2019-12-21 DIAGNOSIS — F4322 Adjustment disorder with anxiety: Secondary | ICD-10-CM | POA: Diagnosis not present

## 2019-12-21 DIAGNOSIS — M25551 Pain in right hip: Secondary | ICD-10-CM | POA: Diagnosis not present

## 2019-12-21 DIAGNOSIS — M25571 Pain in right ankle and joints of right foot: Secondary | ICD-10-CM | POA: Diagnosis not present

## 2019-12-23 ENCOUNTER — Other Ambulatory Visit: Payer: Self-pay | Admitting: *Deleted

## 2019-12-23 DIAGNOSIS — K862 Cyst of pancreas: Secondary | ICD-10-CM

## 2019-12-26 ENCOUNTER — Encounter: Payer: Self-pay | Admitting: Hematology

## 2019-12-28 ENCOUNTER — Other Ambulatory Visit: Payer: Self-pay | Admitting: Physician Assistant

## 2019-12-28 ENCOUNTER — Ambulatory Visit (HOSPITAL_COMMUNITY)
Admission: RE | Admit: 2019-12-28 | Discharge: 2019-12-28 | Disposition: A | Discharge status: Home / Self Care | Payer: BC Managed Care – PPO | Source: Ambulatory Visit | Attending: Physician Assistant | Admitting: Physician Assistant

## 2019-12-28 ENCOUNTER — Other Ambulatory Visit: Payer: Self-pay

## 2019-12-28 DIAGNOSIS — K862 Cyst of pancreas: Secondary | ICD-10-CM | POA: Insufficient documentation

## 2019-12-28 DIAGNOSIS — R935 Abnormal findings on diagnostic imaging of other abdominal regions, including retroperitoneum: Secondary | ICD-10-CM | POA: Diagnosis not present

## 2019-12-28 MED ORDER — GADOBUTROL 1 MMOL/ML IV SOLN
5.0000 mL | Freq: Once | INTRAVENOUS | Status: AC | PRN
  Administered 2019-12-28: 5 mL via INTRAVENOUS

## 2019-12-29 DIAGNOSIS — F331 Major depressive disorder, recurrent, moderate: Secondary | ICD-10-CM | POA: Diagnosis not present

## 2019-12-30 DIAGNOSIS — M25571 Pain in right ankle and joints of right foot: Secondary | ICD-10-CM | POA: Diagnosis not present

## 2019-12-30 DIAGNOSIS — M25572 Pain in left ankle and joints of left foot: Secondary | ICD-10-CM | POA: Diagnosis not present

## 2019-12-30 DIAGNOSIS — M25551 Pain in right hip: Secondary | ICD-10-CM | POA: Diagnosis not present

## 2020-01-02 DIAGNOSIS — M25571 Pain in right ankle and joints of right foot: Secondary | ICD-10-CM | POA: Diagnosis not present

## 2020-01-02 DIAGNOSIS — M25551 Pain in right hip: Secondary | ICD-10-CM | POA: Diagnosis not present

## 2020-01-03 DIAGNOSIS — L821 Other seborrheic keratosis: Secondary | ICD-10-CM | POA: Diagnosis not present

## 2020-01-03 DIAGNOSIS — L603 Nail dystrophy: Secondary | ICD-10-CM | POA: Diagnosis not present

## 2020-01-03 DIAGNOSIS — M25571 Pain in right ankle and joints of right foot: Secondary | ICD-10-CM | POA: Diagnosis not present

## 2020-01-03 DIAGNOSIS — D225 Melanocytic nevi of trunk: Secondary | ICD-10-CM | POA: Diagnosis not present

## 2020-01-03 DIAGNOSIS — M25551 Pain in right hip: Secondary | ICD-10-CM | POA: Diagnosis not present

## 2020-01-03 DIAGNOSIS — L820 Inflamed seborrheic keratosis: Secondary | ICD-10-CM | POA: Diagnosis not present

## 2020-01-03 DIAGNOSIS — L570 Actinic keratosis: Secondary | ICD-10-CM | POA: Diagnosis not present

## 2020-01-03 DIAGNOSIS — Z85828 Personal history of other malignant neoplasm of skin: Secondary | ICD-10-CM | POA: Diagnosis not present

## 2020-01-03 DIAGNOSIS — M25572 Pain in left ankle and joints of left foot: Secondary | ICD-10-CM | POA: Diagnosis not present

## 2020-01-04 DIAGNOSIS — F4322 Adjustment disorder with anxiety: Secondary | ICD-10-CM | POA: Diagnosis not present

## 2020-01-04 DIAGNOSIS — R42 Dizziness and giddiness: Secondary | ICD-10-CM | POA: Diagnosis not present

## 2020-01-04 DIAGNOSIS — R509 Fever, unspecified: Secondary | ICD-10-CM | POA: Diagnosis not present

## 2020-01-05 ENCOUNTER — Other Ambulatory Visit: Payer: Self-pay

## 2020-01-05 DIAGNOSIS — E7800 Pure hypercholesterolemia, unspecified: Secondary | ICD-10-CM

## 2020-01-05 LAB — LIPID PANEL
Chol/HDL Ratio: 2.8 ratio (ref 0.0–4.4)
Cholesterol, Total: 205 mg/dL — ABNORMAL HIGH (ref 100–199)
HDL: 73 mg/dL (ref >39)
LDL Chol Calc (NIH): 123 mg/dL — ABNORMAL HIGH (ref 0–99)
Triglycerides: 50 mg/dL (ref 0–149)
VLDL Cholesterol Cal: 9 mg/dL (ref 5–40)

## 2020-01-05 NOTE — Progress Notes (Signed)
lipid

## 2020-01-06 ENCOUNTER — Telehealth: Payer: Self-pay | Admitting: *Deleted

## 2020-01-06 DIAGNOSIS — M25572 Pain in left ankle and joints of left foot: Secondary | ICD-10-CM | POA: Diagnosis not present

## 2020-01-06 DIAGNOSIS — M25551 Pain in right hip: Secondary | ICD-10-CM | POA: Diagnosis not present

## 2020-01-06 DIAGNOSIS — M25571 Pain in right ankle and joints of right foot: Secondary | ICD-10-CM | POA: Diagnosis not present

## 2020-01-06 DIAGNOSIS — E7800 Pure hypercholesterolemia, unspecified: Secondary | ICD-10-CM

## 2020-01-06 MED ORDER — ROSUVASTATIN CALCIUM 20 MG PO TABS
20.0000 mg | ORAL_TABLET | Freq: Every day | ORAL | 3 refills | Status: DC
Start: 2020-01-06 — End: 2020-01-18

## 2020-01-06 NOTE — Telephone Encounter (Signed)
Results released to my chart  °New script sent to the pharmacy  °Lab orders mailed to the pt  °

## 2020-01-06 NOTE — Progress Notes (Signed)
Virtual Visit via Video Note   This visit type was conducted due to national recommendations for restrictions regarding the COVID-19 Pandemic (e.g. social distancing) in an effort to limit this patient's exposure and mitigate transmission in our community.  Due to her co-morbid illnesses, this patient is at least at moderate risk for complications without adequate follow up.  This format is felt to be most appropriate for this patient at this time.  All issues noted in this document were discussed and addressed.  A limited physical exam was performed with this format.  Please refer to the patient's chart for her consent to telehealth for [facility].   Date:  01/12/2020   ID:  Megan [name], DOB [birth year], MRN [id number]  Patient Location:Home Provider Location: Home  PCP:  Lavone Orn, MD  Cardiologist:  Dr Stanford Breed  Evaluation Performed:  Follow-Up Visit  Chief Complaint:  FU hyperlipidemia  History of Present Illness:    FUhyperlipidemia and dyspnea. MRA January 2015 showed aneurysmal dilatation of the membranous ventricular septum extending into the right ventricular outflow tract. There was no sinus of Valsalva aneurysm. Ejection fraction 57%. Carotid Dopplers August 2016 normal. Echocardiogram May 2018 showed normal LV systolic function and mild mitral regurgitation. 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 Lower ext arterial Dopplers May 2018 normal.Stress echocardiogramFeb 2019was normal. CTA June 2019 showed aneurysm of the membranous ventricular septum that bulges into the right ventricular outflow tract. There is no connection between the left ventricle and right ventricle. There is no sinus of Valsalva aneurysm. Calcium score 0. No coronary disease. ABIs August 2020 showed noncompressible left lower extremity artery and normal right.  Abdominal CT November 2020 showed 9 mm cystic lesion in the tail of the pancreas and follow-up  recommended in 12 months.  There was also note of aortic atherosclerosis.  Most recent lipid panel Jan 06, 2020 showed total cholesterol 205, HDL 73 and LDL 123.  Since last seen patient denies dyspnea, chest pain, palpitations or syncope.  Her blood pressure has run somewhat low following J&J vaccine.  She has increased her oral fluid and sodium intake.  The patient does not have symptoms concerning for COVID-19 infection (fever, chills, cough, or new shortness of breath).    Past Medical History:  Diagnosis Date   Allergic rhinitis    Allergy    Aneurysm of cardiac wall, congenital    a.  septal aneurysm extending into the RVOT (by cardiac MRI in 08/2013)   Anxiety    Aortic atherosclerosis ([facility])    Arthritis    Asthma    Basal cell carcinoma    Depression    Heart murmur    Hypothyroidism    Leukopenia    idiopathic   Lyme disease    Mitral valve prolapse    MVP (mitral valve prolapse)    Orthostatic hypotension    Osteoporosis    Pancreatic cyst    Peripheral neuropathy 06/09/2012   Pernicious anemia    Pernicious anemia    Pernicious anemia    Plantar fasciitis    Pneumothorax    Past Surgical History:  Procedure Laterality Date   COLONOSCOPY WITH PROPOFOL N/A 02/22/2013   Procedure: COLONOSCOPY WITH PROPOFOL;  Surgeon: Garlan Fair, MD;  Location: [facility];  Service: Endoscopy;  Laterality: N/A;   MOUTH SURGERY  01/2012   bone graft in mouth   NASAL SINUS SURGERY Right 06/12/2014   Procedure: RIGHT ENDOSCOPIC MAXILLARY ANTROSTOMY;  Surgeon: Evelena Peat  Cassie Freer, MD;  Location: [place];  Service: ENT;  Laterality: Right;   ROOT CANAL  02/27/2012   TONSILLECTOMY  1962     Current Meds  Medication Sig   5-Hydroxytryptophan (5-HTP) 100 MG CAPS Take 100 mg by mouth daily.    Alpha-Lipoic Acid 600 MG TABS Take 600 mg by mouth daily.   AMINO ACIDS COMPLEX PO Take 1 Scoop by mouth daily.    ARMOUR THYROID 30 MG tablet  Take 30 mg by mouth daily.   Ascorbic Acid (VITAMIN C PO) Take 2,500 Units by mouth daily.    Cats Claw, Uncaria tomentosa, (CATS CLAW PO) Take by mouth.   Cholecalciferol (VITAMIN D3) 10000 units capsule Take 10,000 Units by mouth daily.    Cyanocobalamin (VITAMIN B-12 ER PO) Take 5,000 mcg by mouth daily.    GLUTATHIONE PO Take 250 mg by mouth daily.   Magnesium Ascorbate POWD Take 600 mg by mouth daily.   MAGNESIUM CITRATE PO Take 800 mg by mouth daily.    Menaquinone-7 (VITAMIN K2 PO) Take 1 tablet by mouth daily.   rosuvastatin (CRESTOR) 20 MG tablet Take 1 tablet (20 mg total) by mouth daily.     Allergies:   Augmentin [amoxicillin-pot clavulanate] and Clindamycin/lincomycin   Social History   Tobacco Use   Smoking status: Never Smoker   Smokeless tobacco: Never Used  Substance Use Topics   Alcohol use: No    Alcohol/week: 0.0 standard drinks   Drug use: No     Family Hx: The patient's family history includes Angina in her mother; Breast cancer in her paternal grandmother; Colon cancer in her mother; Diabetes in her paternal grandmother; Heart failure in her father; Hyperlipidemia in her father; Hypertension in her father and mother; Kidney failure in her father; Lung cancer in her mother and paternal grandfather; Prostate cancer in her father. There is no history of Esophageal cancer, Liver cancer, Pancreatic cancer, Rectal cancer, or Stomach cancer.  ROS:   Please see the history of present illness.    No Fever, chills  or productive cough All other systems reviewed and are negative.  Recent Labs: 11/21/2019: Hemoglobin 13.9; Platelets 187 12/20/2019: ALT 18; BUN 15; Creatinine, Ser 0.66; Potassium 4.1; Sodium 140   Recent Lipid Panel Lab Results  Component Value Date/Time   CHOL 205 (H) 01/05/2020 08:33 AM   TRIG 50 01/05/2020 08:33 AM   HDL 73 01/05/2020 08:33 AM   CHOLHDL 2.8 01/05/2020 08:33 AM   CHOLHDL 4.3 07/19/2008 06:25 AM   LDLCALC 123 (H)  01/05/2020 08:33 AM    Wt Readings from Last 3 Encounters:  01/12/20 128 lb (58.1 kg)  01/10/20 128 lb 3.2 oz (58.2 kg)  12/01/19 123 lb (55.8 kg)     Objective:    Vital Signs:  BP 92/65    Pulse 62    Ht 5\' 9"  (1.753 m)    Wt 128 lb (58.1 kg)    BMI 18.90 kg/m    VITAL SIGNS:  reviewed NAD Answers questions appropriately Normal affect Remainder of physical examination not performed (telehealth visit; coronavirus pandemic)  ASSESSMENT & PLAN:    1. Hyperlipidemia-I discussed recent lipid panel with patient today.  LDL is 120 which is not ideal.  She had some degree of aortic atherosclerosis but previous calcium score is 0.  I did recommend a statin but she would prefer to continue diet at this point and avoid medical therapy which I think is reasonable.  We will  follow her lipids. 2. Aneurysmal dilatation of interventricular septum extending into the right ventricular outflow tract-previous studies showed no VSD and no sinus of Valsalva aneurysm. 3. History of orthostasis-patient had recent low blood pressure following J&J vaccine.  This appears to be slowly improving.  Continue increased p.o. fluid and sodium intake. 4. History of chest pain-no recurrent symptoms.  Previous calcium score 0.  COVID-19 Education: The importance of social distancing was discussed today.  Time:   Today, I have spent 16 minutes with the patient with telehealth technology discussing the above problems.     Medication Adjustments/Labs and Tests Ordered: Current medicines are reviewed at length with the patient today.  Concerns regarding medicines are outlined above.   Tests Ordered: No orders of the defined types were placed in this encounter.   Medication Changes: No orders of the defined types were placed in this encounter.   Follow Up:  Either In Person or Virtual in 6 month(s)  Signed, Kirk Ruths, MD  01/12/2020 8:14 AM    [place]

## 2020-01-06 NOTE — Telephone Encounter (Signed)
-----   Message from Lelon Perla, MD sent at 01/05/2020  5:08 PM EDT ----- LDL not at goal; would treat with crestor 20 mg daily with lipids and liver in 12 weeks if pt agreeable Megan [name]

## 2020-01-09 ENCOUNTER — Encounter: Payer: Self-pay | Admitting: *Deleted

## 2020-01-09 DIAGNOSIS — M25551 Pain in right hip: Secondary | ICD-10-CM | POA: Diagnosis not present

## 2020-01-09 NOTE — Progress Notes (Signed)
Cardiology Clinic Note   Patient Name: Megan [name] Date of Encounter: 01/10/2020  Primary Care Provider:  Lavone Orn, MD Primary Cardiologist:  Kirk Ruths, MD  Patient Profile    Megan [name] 64 year old female presents today for an evaluation of her irregular heartbeat.  Past Medical History    Past Medical History:  Diagnosis Date  . Allergic rhinitis   . Allergy   . Aneurysm of cardiac wall, congenital    a.  septal aneurysm extending into the RVOT (by cardiac MRI in 08/2013)  . Anxiety   . Aortic atherosclerosis ([place])   . Arthritis   . Asthma   . Basal cell carcinoma   . Depression   . Heart murmur   . Hypothyroidism   . Leukopenia    idiopathic  . Lyme disease   . Mitral valve prolapse   . MVP (mitral valve prolapse)   . Orthostatic hypotension   . Osteoporosis   . Pancreatic cyst   . Peripheral neuropathy 06/09/2012  . Pernicious anemia   . Pernicious anemia   . Pernicious anemia   . Plantar fasciitis   . Pneumothorax    Past Surgical History:  Procedure Laterality Date  . COLONOSCOPY WITH PROPOFOL N/A 02/22/2013   Procedure: COLONOSCOPY WITH PROPOFOL;  Surgeon: Garlan Fair, MD;  Location: [facility];  Service: Endoscopy;  Laterality: N/A;  . MOUTH SURGERY  01/2012   bone graft in mouth  . NASAL SINUS SURGERY Right 06/12/2014   Procedure: RIGHT ENDOSCOPIC MAXILLARY ANTROSTOMY;  Surgeon: Ascencion Dike, MD;  Location: [place];  Service: ENT;  Laterality: Right;  . ROOT CANAL  02/27/2012  . TONSILLECTOMY  1962    Allergies  Allergies  Allergen Reactions  . Augmentin [Amoxicillin-Pot Clavulanate]   . Clindamycin/Lincomycin     History of Present Illness    Megan [name] has a PMH of mitral valve prolapse, dyspnea, hyperlipidemia, and iron deficiency anemia.  An MRA 1/15 showed aneurysmal dilation of the membranous ventricular septum extending into the right ventricular outflow tract.  There is no sign of Valsalva  aneurysm.  Her ejection fraction was 57%.  Carotid Dopplers 8/16 were normal.  Echocardiogram 5/18 showed normal LV systolic function and mild mitral regurgitation.  Lower extremity arterial Dopplers 5/18 were normal.  Stress echo 2/19 was normal.  CTA June 2019 showed aneurysm of the membranous ventricular septum that bulges into the right ventricular outflow tract.  There was no connection between the left ventricle and the right ventricle.  There is no sinus of Valsalva aneurysm.  Calcium score was 0.  No coronary disease.  Her ABIs 8/20 showed noncompressible left lower extremity artery and normal right.  Abdominal CT 11/20 showed 9 mm cystic lesion in the tail of the pancreas, follow-up recommended 12 months.  Aortic atherosclerosis was also noted.  Her most recent lipid profile 01/05/2020 showed total cholesterol 239, LDL 131 and HDL 99.  She has been monitoring her blood pressure at home.  Her blood pressure monitor has indicated that she has intermittent irregular heartbeats.  She presents to the clinic today for evaluation.  She states that she has been noticing some irregular rhythm alerts on her blood pressure machine.  She is also noticed that since receiving the [employer] vaccine she has had lower blood pressures.  She presented to her PCP who instructed her to increase her fluid and sodium intake.  She states she had a large reduction in blood pressure  during orthostatic blood pressures at that time.  She continues to have occasional dizziness with changes in body position.  She does feel that she has having less dizziness with her increased fluid consumption and adding Himalayan salt to her water.  I will order a 14-day ZIO monitor for palpitations, have her increase her fluid consumption, increase her physical activity as tolerated, wear lower extremity support stockings, and follow-up in 6 weeks.   Home Medications    Prior to Admission medications   Medication Sig Start Date End Date  Taking? Authorizing Provider  5-Hydroxytryptophan (5-HTP) 100 MG CAPS Take 100 mg by mouth daily.     [provider]  Alpha-Lipoic Acid 600 MG TABS Take 600 mg by mouth daily.    [provider]  AMINO ACIDS COMPLEX PO Take 1 Scoop by mouth daily.     [provider]  ARMOUR THYROID 30 MG tablet Take 30 mg by mouth daily. 11/20/16   [provider]  Ascorbic Acid (VITAMIN C PO) Take 2,500 Units by mouth daily.     [provider]  Cholecalciferol (VITAMIN D3) 10000 units capsule Take 10,000 Units by mouth daily.     [provider]  Cyanocobalamin (VITAMIN B-12 ER PO) Take 5,000 mcg by mouth daily.     [provider]  Digestive Enzymes (DIGESTIVE ENZYME PO) Take 1 tablet by mouth daily.    [provider]  GLUTATHIONE PO Take 250 mg by mouth daily.    [provider]  Magnesium Ascorbate POWD Take 600 mg by mouth daily.    [provider]  MAGNESIUM CITRATE PO Take 800 mg by mouth daily.     [provider]  Menaquinone-7 (VITAMIN K2 PO) Take 1 tablet by mouth daily.    [provider]  Omega-3 Fatty Acids (FISH OIL) 1000 MG CAPS Take 1,000 mg by mouth daily.    [provider]  rosuvastatin (CRESTOR) 20 MG tablet Take 1 tablet (20 mg total) by mouth daily. 01/06/20 04/05/20  Lorretta Harp, MD  zinc gluconate 50 MG tablet Take 50 mg by mouth daily.    [provider]    Family History    Family History  Problem Relation Age of Onset  . Hyperlipidemia Father   . Heart failure Father   . Prostate cancer Father   . Kidney failure Father   . Hypertension Father   . Angina Mother   . Hypertension Mother   . Lung cancer Mother   . Colon cancer Mother   . Lung cancer Paternal Grandfather   . Breast cancer Paternal Grandmother   . Diabetes Paternal Grandmother   . Esophageal cancer Neg Hx   . Liver cancer Neg Hx   . Pancreatic cancer Neg Hx   . Rectal cancer  Neg Hx   . Stomach cancer Neg Hx    She indicated that her mother is deceased. She indicated that her father is deceased. She indicated that her maternal grandmother is deceased. She indicated that her maternal grandfather is deceased. She indicated that her paternal grandmother is deceased. She indicated that her paternal grandfather is deceased. She indicated that the status of her neg hx is unknown.  Social History    Social History   Socioeconomic History  . Marital status: Divorced    Spouse name: Not on file  . Number of children: 0  . Years of education: Not on file  . Highest education level: Not on file  Occupational History    Comment: Career and life coach  Tobacco Use  . Smoking status: Never Smoker  . Smokeless tobacco: Never Used  Substance and Sexual Activity  . Alcohol use: No    Alcohol/week: 0.0 standard drinks  . Drug use: No  . Sexual activity: Not on file  Other Topics Concern  . Not on file  Social History Narrative   Patient is single and lives alone.   Patient is self-employed, career and life coaching.   Patient drinks two to four cups of caffeine daily.   Social Determinants of Health   Financial Resource Strain:   . Difficulty of Paying Living Expenses:   Food Insecurity:   . Worried About Charity fundraiser in the Last Year:   . Arboriculturist in the Last Year:   Transportation Needs:   . Film/video editor (Medical):   [name] Lack of Transportation (Non-Medical):   Physical Activity:   . Days of Exercise per Week:   . Minutes of Exercise per Session:   Stress:   . Feeling of Stress :   Social Connections:   . Frequency of Communication with Friends and Family:   . Frequency of Social Gatherings with Friends and Family:   . Attends Religious Services:   . Active Member of Clubs or Organizations:   . Attends Archivist Meetings:   [name] Marital Status:   Intimate Partner Violence:   . Fear of Current or Ex-Partner:   .  Emotionally Abused:   [name] Physically Abused:   . Sexually Abused:      Review of Systems    General:  No chills, fever, night sweats or weight changes.  Cardiovascular:  No chest pain, dyspnea on exertion, edema, orthopnea, palpitations, paroxysmal nocturnal dyspnea. Dermatological: No rash, lesions/masses Respiratory: No cough, dyspnea Urologic: No hematuria, dysuria Abdominal:   No nausea, vomiting, diarrhea, bright red blood per rectum, melena, or hematemesis Neurologic:  No visual changes, wkns, changes in mental status. All other systems reviewed and are otherwise negative except as noted above.  Physical Exam    VS:  BP 90/60 (BP Location: Left Arm, Patient Position: Sitting, Cuff Size: Normal)   Pulse 78   Temp (!) 97.2 F (36.2 C)   Ht 5\' 9"  (1.753 m)   Wt 128 lb 3.2 oz (58.2 kg)   SpO2 99%   BMI 18.93 kg/m  , BMI Body mass index is 18.93 kg/m. GEN: Well nourished, well developed, in no acute distress. HEENT: normal. Neck: Supple, no JVD, carotid bruits, or masses. Cardiac: RRR, no murmurs, rubs, or gallops. No clubbing, cyanosis, edema.  Radials/DP/PT 2+ and equal bilaterally.  Respiratory:  Respirations regular and unlabored, clear to auscultation bilaterally. GI: Soft, nontender, nondistended, BS + x 4. MS: no deformity or atrophy. Skin: warm and dry, no rash. Neuro:  Strength and sensation are intact. Psych: Normal affect.  Accessory Clinical Findings    ECG personally reviewed by me today-sinus rhythm 69 bpm- No acute changes  EKG 07/05/2019 Normal sinus rhythm 67 bpm  Echocardiogram 01/06/2017 Study Conclusions   - Left ventricle: The cavity size was normal. Wall thickness was  normal. Systolic function was normal. The estimated ejection  fraction was in the range of 60% to 65%. Wall motion was normal;  there were no regional wall motion abnormalities. Left  ventricular diastolic function parameters were normal.  - Mitral valve: There was mild  regurgitation.   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  Coronary CTA 01/28/2018 FINDINGS: Non-cardiac: See separate report from [facility].  There is an aneurysm of the membranous ventricular septum that bulges into the RV outflow tract, it is about 1.5 cm deep. There does not appear to be a connection between LV and RV (no contrast extravasation). This is not a sinus of Valsalva aneurysm (it is proximal to aortic valve in the membranous ventricular septum).  Calcium Score: 0 Agatston units.  Coronary Arteries: Right dominant with no anomalies  LM: No plaque or stenosis.  LAD system: No plaque or stenosis.  Circumflex system: No plaque or stenosis.  RCA system: No plaque or stenosis.  IMPRESSION: 1. Aneurysm of the membranous ventricular septum bulging into the RVOT, there does not appear to be a an actual VSD. No sinus of Valsalva aneurysm.  2. Coronary artery calcium score 0 Agatston units, suggesting low risk for future cardiac events.  3.  No plaque or stenosis noted in the coronary tree.  Assessment & Plan   1.  Irregular heartbeat/palpitations -EKG today shows normal sinus rhythm 69 bpm.  Has noticed occasional alerts from home blood pressure monitor indicating she has irregular heartbeat.  Asymptomatic. Order 14-day ZIO monitor Avoid caffeine, chocolate, EtOH etc. Heart healthy diet Increase physical activity as tolerated  Orthostatic hypotension-BP today 90/60.  Better control at home. Continue increase p.o. fluid intake, liberalize salt Lower extremity support stockings-[place] sheet given Increase physical activity as tolerated  Chest pain-no recent episodes of chest discomfort/pain.  01/28/2018 coronary CTA showed calcium score of 0.  Aneurysmal dilation of interventricular septum-noted on coronary CTA and  showed no VSD and no sinus of Valsalva aneurysm.  Follow-up with Dr. Stanford Breed or me in 1 month.  Jossie Ng. Cheryn Lundquist NP-C    01/10/2020, 3:50 PM [facility] [address] Office [phone] Fax [phone]

## 2020-01-10 ENCOUNTER — Ambulatory Visit (INDEPENDENT_AMBULATORY_CARE_PROVIDER_SITE_OTHER): Payer: BC Managed Care – PPO | Admitting: General Practice

## 2020-01-10 ENCOUNTER — Encounter: Payer: Self-pay | Admitting: General Practice

## 2020-01-10 ENCOUNTER — Telehealth: Payer: Self-pay | Admitting: Radiology

## 2020-01-10 ENCOUNTER — Other Ambulatory Visit: Payer: Self-pay

## 2020-01-10 VITALS — BP 90/60 | HR 78 | Temp 97.2°F | Ht 69.0 in | Wt 128.2 lb

## 2020-01-10 DIAGNOSIS — R072 Precordial pain: Secondary | ICD-10-CM | POA: Diagnosis not present

## 2020-01-10 DIAGNOSIS — R002 Palpitations: Secondary | ICD-10-CM | POA: Diagnosis not present

## 2020-01-10 DIAGNOSIS — I951 Orthostatic hypotension: Secondary | ICD-10-CM

## 2020-01-10 DIAGNOSIS — I253 Aneurysm of heart: Secondary | ICD-10-CM

## 2020-01-10 NOTE — Patient Instructions (Signed)
Medication Instructions:  The current medical regimen is effective;  continue present plan and medications as directed. Please refer to the Current Medication list given to you today. *If you need a refill on your cardiac medications before your next appointment, please call your pharmacy*  Testing/Procedures: Your physician has recommended that you wear a 14 DAY ZIO-PATCH monitor. The Zio patch cardiac monitor continuously records heart rhythm data for up to 14 days, this is for patients being evaluated for multiple types heart rhythms. For the first 24 hours post application, please avoid getting the Zio monitor wet in the shower or by excessive sweating during exercise. After that, feel free to carry on with regular activities. Keep soaps and lotions away from the ZIO XT Patch.  This will be mailed to you, please expect 7-10 days to receive.  Special Instructions PLEASE PURCHASE AND WEAR COMPRESSION STOCKINGS DAILY AND OFF AT BEDTIME. Compression stockings are elastic socks that squeeze the legs. They help to increase blood flow to the legs and to decrease swelling in the legs from fluid retention, and reduce the chance of developing blood clots in the lower legs. Please put on in the AM when dressing and off at night when dressing for bed. PLEASE MAKE SURE TO ELEVATE YOU LEGS WHILE SITTING, THIS WILL HELP WITH THE SWELLING ALSO.  PLEASE INCREASE PHYSICAL ACTIVITY AS TOLERATED  CONTINUE LIBERALIZE SALT INTAKE  PLEASE READ AND FOLLOW SALTY 6-ATTACHED  Follow-Up: Your next appointment:  6 week(s)  AFTER MONITOR In Person with Coletta Memos, FNP.  MAKE SURE YOU KEEP UPCOMING APPOINTMENT APPOINTMENT WITH DR [place].  At [facility], you and your health needs are our priority.  As part of our continuing mission to provide you with exceptional heart care, we have created designated Provider Care Teams.  These Care Teams include your primary Cardiologist (physician) and Advanced Practice  Providers (APPs -  Physician Assistants and Nurse Practitioners) who all work together to provide you with the care you need, when you need it.

## 2020-01-10 NOTE — Telephone Encounter (Signed)
Enrolled patient for a 14 day Zio monitor to be mailed to patients home.  

## 2020-01-11 DIAGNOSIS — F331 Major depressive disorder, recurrent, moderate: Secondary | ICD-10-CM | POA: Diagnosis not present

## 2020-01-12 ENCOUNTER — Encounter: Payer: Self-pay | Admitting: Cardiology

## 2020-01-12 ENCOUNTER — Telehealth (INDEPENDENT_AMBULATORY_CARE_PROVIDER_SITE_OTHER): Payer: BC Managed Care – PPO | Admitting: Cardiology

## 2020-01-12 VITALS — BP 92/65 | HR 62 | Ht 69.0 in | Wt 128.0 lb

## 2020-01-12 DIAGNOSIS — M810 Age-related osteoporosis without current pathological fracture: Secondary | ICD-10-CM | POA: Diagnosis not present

## 2020-01-12 DIAGNOSIS — N951 Menopausal and female climacteric states: Secondary | ICD-10-CM | POA: Diagnosis not present

## 2020-01-12 DIAGNOSIS — R5381 Other malaise: Secondary | ICD-10-CM | POA: Diagnosis not present

## 2020-01-12 DIAGNOSIS — E7800 Pure hypercholesterolemia, unspecified: Secondary | ICD-10-CM | POA: Diagnosis not present

## 2020-01-12 DIAGNOSIS — R002 Palpitations: Secondary | ICD-10-CM | POA: Diagnosis not present

## 2020-01-12 DIAGNOSIS — R5383 Other fatigue: Secondary | ICD-10-CM | POA: Diagnosis not present

## 2020-01-12 DIAGNOSIS — I951 Orthostatic hypotension: Secondary | ICD-10-CM

## 2020-01-12 NOTE — Patient Instructions (Signed)
Medication Instructions:  NO CHANGE *If you need a refill on your cardiac medications before your next appointment, please call your pharmacy*   Lab Work:  Your physician recommends that you return for lab work PRIOR TO FOLLOW UP APPOINTMENT IN 6 MONTHS  If you have labs (blood work) drawn today and your tests are completely normal, you will receive your results only by: [name] MyChart Message (if you have MyChart) OR . A paper copy in the mail If you have any lab test that is abnormal or we need to change your treatment, we will call you to review the results.   Follow-Up: At [facility], you and your health needs are our priority.  As part of our continuing mission to provide you with exceptional heart care, we have created designated Provider Care Teams.  These Care Teams include your primary Cardiologist (physician) and Advanced Practice Providers (APPs -  Physician Assistants and Nurse Practitioners) who all work together to provide you with the care you need, when you need it.  We recommend signing up for the patient portal called "MyChart".  Sign up information is provided on this After Visit Summary.  MyChart is used to connect with patients for Virtual Visits (Telemedicine).  Patients are able to view lab/test results, encounter notes, upcoming appointments, etc.  Non-urgent messages can be sent to your provider as well.   To learn more about what you can do with MyChart, go to [email or web address].    Your next appointment:   6 month(s)  The format for your next appointment:   Either In Person or Virtual  Provider:   You may see Kirk Ruths, MD or one of the following Advanced Practice Providers on your designated Care Team:    Kerin Ransom, PA-C  [place], [place]  Coletta Memos, 

## 2020-01-13 DIAGNOSIS — M25571 Pain in right ankle and joints of right foot: Secondary | ICD-10-CM | POA: Diagnosis not present

## 2020-01-13 DIAGNOSIS — M25572 Pain in left ankle and joints of left foot: Secondary | ICD-10-CM | POA: Diagnosis not present

## 2020-01-13 DIAGNOSIS — M25551 Pain in right hip: Secondary | ICD-10-CM | POA: Diagnosis not present

## 2020-01-16 DIAGNOSIS — M9902 Segmental and somatic dysfunction of thoracic region: Secondary | ICD-10-CM | POA: Diagnosis not present

## 2020-01-16 DIAGNOSIS — M9903 Segmental and somatic dysfunction of lumbar region: Secondary | ICD-10-CM | POA: Diagnosis not present

## 2020-01-16 DIAGNOSIS — M9905 Segmental and somatic dysfunction of pelvic region: Secondary | ICD-10-CM | POA: Diagnosis not present

## 2020-01-16 DIAGNOSIS — M62830 Muscle spasm of back: Secondary | ICD-10-CM | POA: Diagnosis not present

## 2020-01-17 DIAGNOSIS — F331 Major depressive disorder, recurrent, moderate: Secondary | ICD-10-CM | POA: Diagnosis not present

## 2020-01-18 ENCOUNTER — Encounter: Payer: Self-pay | Admitting: Internal Medicine

## 2020-01-18 ENCOUNTER — Ambulatory Visit (INDEPENDENT_AMBULATORY_CARE_PROVIDER_SITE_OTHER): Payer: BC Managed Care – PPO | Admitting: Internal Medicine

## 2020-01-18 VITALS — BP 84/60 | HR 60 | Ht 69.0 in | Wt 128.0 lb

## 2020-01-18 DIAGNOSIS — K5909 Other constipation: Secondary | ICD-10-CM | POA: Diagnosis not present

## 2020-01-18 DIAGNOSIS — K862 Cyst of pancreas: Secondary | ICD-10-CM | POA: Diagnosis not present

## 2020-01-18 DIAGNOSIS — E538 Deficiency of other specified B group vitamins: Secondary | ICD-10-CM

## 2020-01-18 DIAGNOSIS — D510 Vitamin B12 deficiency anemia due to intrinsic factor deficiency: Secondary | ICD-10-CM

## 2020-01-18 NOTE — Progress Notes (Signed)
Subjective:    Patient ID: Megan [name], female    DOB: [birth year], 64 y.o.   MRN: [id number]  HPI Megan [name] is a 64 year old female with a history of chronic constipation/colonic inertia, family history of colon cancer in her mother, family history of colon polyps in her father, pernicious anemia, hypothyroidism, prior exposure to Lyme disease, osteoporosis who is seen in follow-up.  She is here alone today and I last saw her in December 2020 but she was seen by Ellouise Newer, PA-C in April 2021.  She reports that currently her bowel movements have been somewhat better.  She does have periods where she will go several days without bowel movement and she will have fullness in her abdomen but then other times when she seems to go 5 days/week not always with complete evacuation but normally "good movements".  No blood in her stool or melena.  She will have to manually press on the perineum maybe once a month to help with defecation.  She is currently treating this with magnesium both magnesium citrate 800 mg a day and recently started magnesium oxide which she thinks is 350 mg a day.  She sees Dr. Tye Savoy with [facility] as well as Dr. Mauro Kaufmann with functional medicine.  No upper abdominal pain, nausea or vomiting.  Appetite has been good.  She received the [employer] vaccination approximately 3 weeks ago and since this time she has had mild dizziness, mild feeling of dyspnea and lower blood pressure.  She is also had intermittent cramping in her calfs.  She did have SIBO breath testing which was negative.  She recently had a follow-up MRI of the abdomen to follow-up on a pancreatic cyst   Review of Systems As per HPI, otherwise negative  Current Medications, Allergies, Past Medical History, Past Surgical History, Family History and Social History were reviewed in [employer] record.     Objective:   Physical Exam BP (!) 84/60   Pulse 60    Ht 5\' 9"  (1.753 m)   Wt 128 lb (58.1 kg)   BMI 18.90 kg/m  Gen: awake, alert, NAD HEENT: anicteric CV: RRR, no mrg Pulm: CTA b/l Abd: soft, NT/ND, +BS throughout Ext: no c/c, trace pretibial edema Neuro: nonfocal  MRI ABDOMEN WITHOUT AND WITH CONTRAST (INCLUDING MRCP)   TECHNIQUE: Multiplanar multisequence MR imaging of the abdomen was performed both before and after the administration of intravenous contrast. Heavily T2-weighted images of the biliary and pancreatic ducts were obtained, and three-dimensional MRCP images were rendered by post processing.   CONTRAST:  56mL GADAVIST GADOBUTROL 1 MMOL/ML IV SOLN   COMPARISON:  CTs on 07/20/2019 and 08/08/2016   FINDINGS: Lower chest: No acute findings.   Hepatobiliary: No hepatic masses identified. Gallbladder is unremarkable. No evidence of biliary ductal dilatation.   Pancreas: An 8 mm cyst is seen at the junction of the pancreatic body and tail which is stable in size and appearance to previous CTs dating back to 2017. No solid pancreatic masses identified. No evidence of pancreatic ductal dilatation.   Spleen:  Within normal limits in size and appearance.   Adrenals/Urinary Tract: No masses identified. No evidence of hydronephrosis.   Stomach/Bowel: Visualized portion unremarkable.   Vascular/Lymphatic: No pathologically enlarged lymph nodes identified. No abdominal aortic aneurysm. Congenital duplication of IVC again noted.   Other:  None.   Musculoskeletal:  No suspicious bone lesions identified.   IMPRESSION: Stable 8 mm cyst at junction of pancreatic  body and tail, likely representing indolent cystic neoplasm such as a side-branch IPMN. Recommend continued followup by abdomen MRI without and with contrast in 1 year. This recommendation follows ACR consensus guidelines: Management of Incidental Pancreatic Cysts: A White Paper of the ACR Incidental Findings Committee. [place] [id number].       Electronically Signed   By: Marlaine Hind M.D.   On: 12/28/2019 14:41   CBC    Component Value Date/Time   WBC 3.2 (L) 11/21/2019 1354   RBC 4.50 11/21/2019 1354   HGB 13.9 11/21/2019 1354   HGB 13.7 03/15/2013 1144   HCT 41.7 11/21/2019 1354   HCT 40.8 03/15/2013 1144   PLT 187 11/21/2019 1354   PLT 210 03/15/2013 1144   MCV 92.7 11/21/2019 1354   MCV 88.9 03/15/2013 1144   MCH 30.9 11/21/2019 1354   MCHC 33.3 11/21/2019 1354   RDW 12.0 11/21/2019 1354   RDW 13.8 03/15/2013 1144   LYMPHSABS 1.2 11/21/2019 1354   LYMPHSABS 1.0 03/15/2013 1144   MONOABS 0.2 11/21/2019 1354   MONOABS 0.3 03/15/2013 1144   EOSABS 0.0 11/21/2019 1354   EOSABS 0.0 03/15/2013 1144   BASOSABS 0.0 11/21/2019 1354   BASOSABS 0.1 03/15/2013 1144   CMP     Component Value Date/Time   NA 140 12/20/2019 1132   NA 143 01/19/2018 0941   NA 140 05/24/2012 1053   K 4.1 12/20/2019 1132   K 4.1 05/24/2012 1053   CL 105 12/20/2019 1132   CL 106 05/24/2012 1053   CO2 31 12/20/2019 1132   CO2 27 05/24/2012 1053   GLUCOSE 79 12/20/2019 1132   GLUCOSE 72 05/24/2012 1053   BUN 15 12/20/2019 1132   BUN 11 01/19/2018 0941   BUN 13.0 05/24/2012 1053   CREATININE 0.66 12/20/2019 1132   CREATININE 0.79 03/03/2019 1432   CREATININE 0.60 11/04/2017 0918   CREATININE 0.7 05/24/2012 1053   CALCIUM 9.3 12/20/2019 1132   CALCIUM 9.5 05/24/2012 1053   PROT 6.3 12/20/2019 1132   PROT CANCELED 01/19/2018 0941   PROT 6.6 05/24/2012 1053   ALBUMIN 4.3 12/20/2019 1132   ALBUMIN 4.0 05/24/2012 1053   AST 24 12/20/2019 1132   AST 50 (H) 03/03/2019 1432   AST 29 05/24/2012 1053   ALT 18 12/20/2019 1132   ALT 31 03/03/2019 1432   ALT 13 05/24/2012 1053   ALKPHOS 79 12/20/2019 1132   ALKPHOS 65 05/24/2012 1053   BILITOT 0.6 12/20/2019 1132   BILITOT 1.0 03/03/2019 1432   BILITOT 0.50 05/24/2012 1053   GFRNONAA >60 07/20/2019 0208   GFRNONAA >60 03/03/2019 1432   GFRNONAA 98 11/04/2017 0918   GFRAA >60  07/20/2019 0208   GFRAA >60 03/03/2019 1432   GFRAA 113 11/04/2017 0918   Lab Results  Component Value Date   VITAMINB12 2,394 (H) 11/21/2019       Assessment & Plan:  64 year old female with a history of chronic constipation/colonic inertia, family history of colon cancer in her mother, family history of colon polyps in her father, pernicious anemia, hypothyroidism, prior exposure to Lyme disease, osteoporosis who is seen in follow-up.  1.  Chronic constipation/colonic inertia --she is having multiple bowel movements a week and is satisfied with her current bowel function using magnesium supplementation.  We discussed this today.  We did discuss pharmacologic therapy but she prefers a more natural approach.  Of note I did try MiraLAX purge in December after which she had absolutely no response.  We discussed how this is further evidence to her constipation/colonic inertia.  Should she need bowel purge in the future would consider a branded bowel preparation.  2.  Pernicious anemia --confirmed with antiparietal and antiintrinsic factor antibody.  She is taking B12 supplements sublingually and her B12 is normal.  We have again discussed upper endoscopy which was previously recommended to evaluate pernicious anemia.  We discussed how pernicious anemia can be associated with autoimmune atrophic gastritis.  She is not having any concerning upper GI symptoms and so she wishes to defer upper endoscopy and discuss at annual follow-up.  3.  Family history of colon cancer --she is up-to-date with screening colonoscopy, repeat colonoscopy due for screening July 2024  4.  Sidebranch IPMN --her MRI reveals a very stable 0.8 cm pancreatic cyst likely an IPMN.  We can survey this again in 1 year by MRI which is not current recommendation.  We discussed the very low risk nature of this lesion  30 minutes total spent today including patient facing time, coordination of care, reviewing medical  history/procedures/pertinent radiology studies, and documentation of the encounter.

## 2020-01-18 NOTE — Patient Instructions (Addendum)
Please follow up with Dr Hilarie Fredrickson in 1 year.  You will be due for MRI abdomen w/wo pancreatic protocol for follow up of your pancreatic cyst in May of 2022. We will contact you with an appointment time and date when it gets closer to that time.  Continue current medications.  If you are age 65 or older, your body mass index should be between 23-30. Your Body mass index is 18.9 kg/m. If this is out of the aforementioned range listed, please consider follow up with your Primary Care Provider.  If you are age 37 or younger, your body mass index should be between 19-25. Your Body mass index is 18.9 kg/m. If this is out of the aformentioned range listed, please consider follow up with your Primary Care Provider.   Due to recent changes in healthcare laws, you may see the results of your imaging and laboratory studies on MyChart before your provider has had a chance to review them.  We understand that in some cases there may be results that are confusing or concerning to you. Not all laboratory results come back in the same time frame and the provider may be waiting for multiple results in order to interpret others.  Please give [place] 48 hours in order for your provider to thoroughly review all the results before contacting the office for clarification of your results.

## 2020-01-20 ENCOUNTER — Ambulatory Visit (INDEPENDENT_AMBULATORY_CARE_PROVIDER_SITE_OTHER): Payer: BC Managed Care – PPO

## 2020-01-20 DIAGNOSIS — R002 Palpitations: Secondary | ICD-10-CM

## 2020-02-09 ENCOUNTER — Ambulatory Visit: Payer: Self-pay | Admitting: Podiatrist

## 2020-02-18 NOTE — Progress Notes (Deleted)
Cardiology Clinic Note   Patient Name: Megan [name] Date of Encounter: 02/18/2020  Primary Care Provider:  Lavone Orn, MD Primary Cardiologist:  Kirk Ruths, MD  Patient Profile    Megan Circle. [name] 64 year old female presents today for a follow-up evaluation of her irregular heartbeat.  Past Medical History    Past Medical History:  Diagnosis Date  . Allergic rhinitis   . Allergy   . Aneurysm of cardiac wall, congenital    a.  septal aneurysm extending into the RVOT (by cardiac MRI in 08/2013)  . Anxiety   . Aortic atherosclerosis ([place])   . Arthritis   . Asthma   . Basal cell carcinoma   . Depression   . Heart murmur   . Hypothyroidism   . Leukopenia    idiopathic  . Lyme disease   . Mitral valve prolapse   . MVP (mitral valve prolapse)   . Orthostatic hypotension   . Osteoporosis   . Pancreatic cyst   . Peripheral neuropathy 06/09/2012  . Pernicious anemia   . Pernicious anemia   . Pernicious anemia   . Plantar fasciitis   . Pneumothorax    Past Surgical History:  Procedure Laterality Date  . COLONOSCOPY WITH PROPOFOL N/A 02/22/2013   Procedure: COLONOSCOPY WITH PROPOFOL;  Surgeon: Garlan Fair, MD;  Location: [facility];  Service: Endoscopy;  Laterality: N/A;  . MOUTH SURGERY  01/2012   bone graft in mouth  . NASAL SINUS SURGERY Right 06/12/2014   Procedure: RIGHT ENDOSCOPIC MAXILLARY ANTROSTOMY;  Surgeon: Ascencion Dike, MD;  Location: [facility];  Service: ENT;  Laterality: Right;  . ROOT CANAL  02/27/2012  . TONSILLECTOMY  1962    Allergies  Allergies  Allergen Reactions  . Augmentin [Amoxicillin-Pot Clavulanate]   . Clindamycin/Lincomycin     History of Present Illness    Ms. Langham has a PMH of mitral valve prolapse, dyspnea, hyperlipidemia, and iron deficiency anemia.  An MRA 1/15 showed aneurysmal dilation of the membranous ventricular septum extending into the right ventricular outflow tract.  There is no sign of  Valsalva aneurysm.  Her ejection fraction was 57%.  Carotid Dopplers 8/16 were normal.  Echocardiogram 5/18 showed normal LV systolic function and mild mitral regurgitation.  Lower extremity arterial Dopplers 5/18 were normal.  Stress echo 2/19 was normal.  CTA June 2019 showed aneurysm of the membranous ventricular septum that bulges into the right ventricular outflow tract.  There was no connection between the left ventricle and the right ventricle.  There is no sinus of Valsalva aneurysm.  Calcium score was 0.  No coronary disease.  Her ABIs 8/20 showed noncompressible left lower extremity artery and normal right.  Abdominal CT 11/20 showed 9 mm cystic lesion in the tail of the pancreas, follow-up recommended 12 months.  Aortic atherosclerosis was also noted.  Her most recent lipid profile 01/05/2020 showed total cholesterol 239, LDL 131 and HDL 99.  She was seen in clinic on 01/10/2020.  She had been monitoring her blood pressure at home.  Her blood pressure monitor has indicated that she had intermittent irregular heartbeats.  She presented to the clinic for evaluation.  She stated that she had been noticing some irregular rhythm alerts on her blood pressure machine.  She was also noticed that since receiving the [employer] vaccine she  had lower blood pressures.  She presented to her PCP who instructed her to increase her fluid and sodium intake.  She stated  she had a large reduction in blood pressure during orthostatic blood pressures at that time.  She continued to have occasional dizziness with changes in body position.  She did feel that she was having less dizziness with her increased fluid consumption and adding Himalayan salt to her water.  I will ordered a 14-day ZIO monitor for palpitations, have her increase her fluid consumption, increase her physical activity as tolerated, wear lower extremity support stockings, and planned follow-up for 6 weeks.  She was seen by Dr. Stanford Breed on 01/12/2020  virtually.  At that time she continued to improve with increased fluid consumption.  14-day ZIO monitor has not resulted  She presents to the clinic today for follow-up evaluation and states***  *** denies chest pain, shortness of breath, lower extremity edema, fatigue, palpitations, melena, hematuria, hemoptysis, diaphoresis, weakness, presyncope, syncope, orthopnea, and PND.   Home Medications    Prior to Admission medications   Medication Sig Start Date End Date Taking? Authorizing Provider  5-Hydroxytryptophan (5-HTP) 100 MG CAPS Take 100 mg by mouth daily.     [provider]  5-Methyltetrahydrofolate (METHYL FOLATE) POWD Take 680 mcg/L by mouth daily.    [provider]  Alpha-Lipoic Acid 600 MG TABS Take 600 mg by mouth daily.    [provider]  AMINO ACIDS COMPLEX PO Take 1 Scoop by mouth daily.     [provider]  ARMOUR THYROID 30 MG tablet Take 30 mg by mouth daily. 11/20/16   [provider]  Ascorbic Acid (VITAMIN C PO) Take 2,500 Units by mouth daily.     [provider]  Biotin 5 MG TABS Take 1 tablet by mouth daily.    [provider]  Cats Claw, Uncaria tomentosa, (CATS CLAW PO) Take by mouth.    [provider]  Cholecalciferol (VITAMIN D3) 10000 units capsule Take 10,000 Units by mouth daily.     [provider]  Cyanocobalamin (VITAMIN B-12 ER PO) Take 5,000 mcg by mouth daily.     [provider]  GLUTATHIONE PO Take 250 mg by mouth daily.    [provider]  Magnesium Ascorbate POWD Take 600 mg by mouth daily.    [provider]  MAGNESIUM CITRATE PO Take 800 mg by mouth daily.     [provider]  Menaquinone-7 (VITAMIN K2 PO) Take 1 tablet by mouth daily.    [provider]    Family History    Family History  Problem Relation Age of Onset  . Hyperlipidemia Father   . Heart failure Father   . Prostate cancer Father   . Kidney  failure Father   . Hypertension Father   . Angina Mother   . Hypertension Mother   . Lung cancer Mother   . Colon cancer Mother   . Lung cancer Paternal Grandfather   . Breast cancer Paternal Grandmother   . Diabetes Paternal Grandmother   . Esophageal cancer Neg Hx   . Liver cancer Neg Hx   . Pancreatic cancer Neg Hx   . Rectal cancer Neg Hx   . Stomach cancer Neg Hx    She indicated that her mother is deceased. She indicated that her father is deceased. She indicated that her maternal grandmother is deceased. She indicated that her maternal grandfather is deceased. She indicated that her paternal grandmother is deceased. She indicated that her paternal grandfather is deceased. She indicated that the status of her neg hx is unknown.  Social History  Social History   Socioeconomic History  . Marital status: Divorced    Spouse name: Not on file  . Number of children: 0  . Years of education: Not on file  . Highest education level: Not on file  Occupational History    Comment: Career and life coach  Tobacco Use  . Smoking status: Never Smoker  . Smokeless tobacco: Never Used  Vaping Use  . Vaping Use: Never used  Substance and Sexual Activity  . Alcohol use: No    Alcohol/week: 0.0 standard drinks  . Drug use: No  . Sexual activity: Not on file  Other Topics Concern  . Not on file  Social History Narrative   Patient is single and lives alone.   Patient is self-employed, career and life coaching.   Patient drinks two to four cups of caffeine daily.   Social Determinants of Health   Financial Resource Strain:   . Difficulty of Paying Living Expenses:   Food Insecurity:   . Worried About Charity fundraiser in the Last Year:   . Arboriculturist in the Last Year:   Transportation Needs:   . Film/video editor (Medical):   [name] Lack of Transportation (Non-Medical):   Physical Activity:   . Days of Exercise per Week:   . Minutes of Exercise per Session:   Stress:    . Feeling of Stress :   Social Connections:   . Frequency of Communication with Friends and Family:   . Frequency of Social Gatherings with Friends and Family:   . Attends Religious Services:   . Active Member of Clubs or Organizations:   . Attends Archivist Meetings:   [name] Marital Status:   Intimate Partner Violence:   . Fear of Current or Ex-Partner:   . Emotionally Abused:   [name] Physically Abused:   . Sexually Abused:      Review of Systems    General:  No chills, fever, night sweats or weight changes.  Cardiovascular:  No chest pain, dyspnea on exertion, edema, orthopnea, palpitations, paroxysmal nocturnal dyspnea. Dermatological: No rash, lesions/masses Respiratory: No cough, dyspnea Urologic: No hematuria, dysuria Abdominal:   No nausea, vomiting, diarrhea, bright red blood per rectum, melena, or hematemesis Neurologic:  No visual changes, wkns, changes in mental status. All other systems reviewed and are otherwise negative except as noted above.  Physical Exam    VS:  There were no vitals taken for this visit. , BMI There is no height or weight on file to calculate BMI. GEN: Well nourished, well developed, in no acute distress. HEENT: normal. Neck: Supple, no JVD, carotid bruits, or masses. Cardiac: RRR, no murmurs, rubs, or gallops. No clubbing, cyanosis, edema.  Radials/DP/PT 2+ and equal bilaterally.  Respiratory:  Respirations regular and unlabored, clear to auscultation bilaterally. GI: Soft, nontender, nondistended, BS + x 4. MS: no deformity or atrophy. Skin: warm and dry, no rash. Neuro:  Strength and sensation are intact. Psych: Normal affect.  Accessory Clinical Findings    ECG personally reviewed by me today- *** - No acute changes  EKG 01/10/2020 sinus rhythm 69 bpm- No acute changes  EKG 07/05/2019 Normal sinus rhythm 67 bpm  Echocardiogram 01/06/2017 Study Conclusions   - Left ventricle: The cavity size was normal. Wall thickness was   normal. Systolic function was normal. The estimated ejection  fraction was in the range of 60% to 65%. Wall motion was normal;  there were no regional wall motion abnormalities. Left  ventricular diastolic function parameters were normal.  - Mitral valve: There was mild regurgitation.   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  Coronary CTA 01/28/2018 FINDINGS: Non-cardiac: See separate report from [facility].  There is an aneurysm of the membranous ventricular septum that bulges into the RV outflow tract, it is about 1.5 cm deep. There does not appear to be a connection between LV and RV (no contrast extravasation). This is not a sinus of Valsalva aneurysm (it is proximal to aortic valve in the membranous ventricular septum).  Calcium Score: 0 Agatston units.  Coronary Arteries: Right dominant with no anomalies  LM: No plaque or stenosis.  LAD system: No plaque or stenosis.  Circumflex system: No plaque or stenosis.  RCA system: No plaque or stenosis.  IMPRESSION: 1. Aneurysm of the membranous ventricular septum bulging into the RVOT, there does not appear to be a an actual VSD. No sinus of Valsalva aneurysm.  2. Coronary artery calcium score 0 Agatston units, suggesting low risk for future cardiac events.  3. No plaque or stenosis noted in the coronary tree.  Assessment & Plan   1.  Irregular heartbeat/palpitations -rate today ***bpm.  Resolved.  Had noticed occasional alerts from home blood pressure monitor indicating she has irregular heartbeat.  Asymptomatic. Order 14-day ZIO monitor Avoid caffeine, chocolate, EtOH etc. Heart healthy diet Increase physical activity as tolerated  Orthostatic hypotension-BP today 90/60.***   Continue increase p.o. fluid intake, liberalize salt Lower extremity support  stockings-[place] sheet given Increase physical activity as tolerated  Chest pain-no recent episodes of chest discomfort/pain.  01/28/2018 coronary CTA showed calcium score of 0.  Aneurysmal dilation of interventricular septum-noted on coronary CTA and showed no VSD and no sinus of Valsalva aneurysm.  Follow-up with Dr. Stanford Breed or me in 6 months.  Jossie Ng. Sheleen Conchas NP-C    02/18/2020, 9:29 AM [place] [address] Office [phone] Fax [phone]

## 2020-02-21 ENCOUNTER — Ambulatory Visit: Payer: BC Managed Care – PPO | Admitting: General Practice

## 2020-02-28 ENCOUNTER — Ambulatory Visit (INDEPENDENT_AMBULATORY_CARE_PROVIDER_SITE_OTHER): Payer: Medicare Other | Admitting: Psychiatry

## 2020-02-28 ENCOUNTER — Other Ambulatory Visit: Payer: Self-pay

## 2020-02-28 DIAGNOSIS — F341 Dysthymic disorder: Secondary | ICD-10-CM

## 2020-02-28 DIAGNOSIS — F4010 Social phobia, unspecified: Secondary | ICD-10-CM

## 2020-02-28 DIAGNOSIS — R5382 Chronic fatigue, unspecified: Secondary | ICD-10-CM | POA: Diagnosis not present
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
## 2020-02-29 ENCOUNTER — Encounter: Payer: Self-pay | Admitting: Internal Medicine

## 2020-02-29 ENCOUNTER — Ambulatory Visit (INDEPENDENT_AMBULATORY_CARE_PROVIDER_SITE_OTHER): Payer: Medicare Other | Admitting: Internal Medicine

## 2020-02-29 VITALS — BP 102/70 | HR 62 | Ht 69.0 in | Wt 126.0 lb

## 2020-02-29 DIAGNOSIS — E039 Hypothyroidism, unspecified: Secondary | ICD-10-CM | POA: Diagnosis not present

## 2020-02-29 DIAGNOSIS — M810 Age-related osteoporosis without current pathological fracture: Secondary | ICD-10-CM | POA: Diagnosis not present

## 2020-02-29 LAB — TSH: TSH: 1.96 u[IU]/mL (ref 0.35–4.50)

## 2020-02-29 LAB — T4, FREE: Free T4: 0.8 ng/dL (ref 0.60–1.60)

## 2020-02-29 LAB — T3, FREE: T3, Free: 5.7 pg/mL — ABNORMAL HIGH (ref 2.3–4.2)

## 2020-02-29 NOTE — Progress Notes (Signed)
Patient ID: Megan [name], female   DOB: 1956/08/16, 64 y.o.   MRN: [id number]   This visit occurred during the SARS-CoV-2 public health emergency.  Safety protocols were in place, including screening questions prior to the visit, additional usage of staff PPE, and extensive cleaning of exam room while observing appropriate contact time as indicated for disinfecting solutions.   HPI  Megan [name] is a 64 y.o.-year-old female, initially referred by her OB/GYN doctor, Dr. Ronita Hipps, returning for follow-up for osteoporosis and hypothyroidism.  Last visit 1 year ago.  She continues to see multiple specialists.  She was diagnosed with osteoporosis in 2006, but she had lower BMD even before menopause in 2004.  Before last visit, she had another bone density which showed T-scores consistent with dose from 07/2017.  She continues to follow-up with a bone specialist in [place] but would also want to continue to follow with me for this problem.  Reviewed the available DXA scan reports: Date L1-L4 T score FN T score 33% distal Radius  11/09/2019 Baldo Ash, [place]) -3.5 (-0.4%) RFN: -2.4  LFN: -2.4 n/a  11/03/2018 Baldo Ash, [place]) -3.5 (-2.8%) RFN: -2.3 LFN: -2.1  n/a  11/10/2017 Baldo Ash, [place]) -2.3 RFN: n/a LFN: n/a n/a  08/13/2017 -3.3 (-4.4%*) RFN: -2.0 LFN: -1.8 n/a  08/10/2015 -3.0 RFN: -2.0 LFN: -2.2 n/a   Per records brought by patient: L1-L4:  06/2013: -3.5  10/2007: -3.1  07/2005: -2.5  05/2003: -2.2  RFN:  06/2013: -2.2  10/2007: -2.0  07/2005: -2.1  05/2003: -1.8  LFN:  06/2013: -2.2  10/2007: -2.1  07/2005: -2.2  05/2003: -1.9  She had 1 fracture: - 07/2016: Right rib  No recent falls or fractures.  Previous osteoporotic treatments reviewed: - Fosamax and Actonel - 2001-2004 (no help) - Estradiol 0.0125 mg + Progesterone - 2014-2016 (improvement in spine BMD) >> she  just started a combination of Estradiol-Prometrium-Testosterone 0.06-5-0.33 mg/gtt - 4  drops per day  No history of vitamin D deficiency.    Reviewed previous vitamin D levels: 07/14/2019: Vitamin D 57 01/27/2019: Vitamin D 50.7-on 5000 units vitamin D daily 03/29/2018: Vitamin D 82.5 09/2017: Vitamin D 72 06/17/2016: Vitamin D 35 No results found for: VD25OH   She continues on 2000 units vitamin D 5 days a week >> but increased before last visit to 5000 units a day.  She continues on this dose.  Also taking vitamin K2.  She continues to exercise consistently.  She tried [place] >> got hurt - also had a HA that lasted 3 weeks.  She retry this earlier in 2021 >> she again hurt herself and developed a headache.   No history ofrepeated steroid courses.  In 2018, she ruled out for multiple myeloma by protein electrophoresis.  She has polyclonal gammopathy and follows with hematology.  She was also investigated by nephrology for proteinuria.  Menopause was at 64 years old.  Pt does have a FH of osteoporosis: M - had spinal fx's, father - Lupron.  She was seeing Dr. Knox Saliva >> then switched to another functional medicine doctor. She was told she had mold in her house, low WBC, tested positive for heavy metals, iron is low (she had a recent low ferritin level, also - 01/2019).    No history of kidney stones, hyper or hypocalcemia or hyperparathyroidism: Lab Results  Component Value Date   CALCIUM 9.3 12/20/2019   CALCIUM 9.5 07/20/2019   CALCIUM 9.5 07/16/2019   CALCIUM 9.7 03/03/2019   CALCIUM 9.2 08/22/2018  CALCIUM 9.4 03/20/2018   CALCIUM 9.2 01/19/2018   CALCIUM 9.2 12/30/2017   CALCIUM 9.5 11/04/2017   CALCIUM 9.8 08/08/2016  02/03/2019: calcium 9.7 03/29/2018: PTH 48  No history of CKD.  Latest BUN/creatinine: Lab Results  Component Value Date   BUN 15 12/20/2019   CREATININE 0.66 12/20/2019   Hypothyroidism.  She was initially on Nature-Throid, then had to switch to Armour due to [employer]. She noticed hair loss after the switch, but no  other symptoms.  In 2019, she was taken off the medication by one of her providers but she felt terrible and restarted.    Reviewed her TFTs: 01/27/2019: TSH: 2.21 Lab Results  Component Value Date   TSH 2.78 06/30/2018  03/29/2018: TSH 3.17, free T4 0.75, free T3 3.0 02/10/2018: TSH 1.89, free T4 0.9, free T3 2.2 11/04/2017: TSH 2.74, free T4 0.59, free T3 5.2 08/14/2017: TSH 1.780, fT4 1.08 - pt was on Biotin 10,000 mcg when labs drawn; Selenium and Urinary iodine normal 07/19/2008: TSH 6.76  Pt is on Armour 30 mg daily (equivalent to 50 mcg levothyroxine daily), taken: - in am - fasting - + coffee + almond milk 30 min later - at least 30 min from b'fast - no Ca, Fe, MVI, PPIs - stopped Biotin 5000 mcg daily  She is on multiple other supplements, including alpha-lipoic acid, co-Q10, 5 HTP, vitamin B12, probiotics, magnesium, etc. We stopped her vitamin A since this was found to exacerbate osteoporosis. She stopped the supplement and also her multivitamins due to elevated B6 vitamin level.  At last check, on 02/10/2018, her vitamin B6 was 10.9 (2.0-32.8), normal. In 2019, she  went to the emergency room for chest pain after she started a natural supplement (nitric balance-ATP), which she stopped since.  Elevated estradiol:  Reviewed history: She had previously undetectable estradiol levels in 07/25/2013, 08/21/2014, 05/05/2016, 06/03/2016, however, in 04/25/2017, her level was 44.3 pg/mL.  At that time, she was giving estriol to her dog without gloves.  She started to use gloves and her level became undetectable again.  She then started to reuse the same gloves and in 03/23/2018, the level was 197.8 pg/mL.  She started to use gloves only once for administration and her level decreased to 101.8 on 03/29/2018.  She was very worried about this despite reassurance that the pattern of estrogen increase was consistent with interference with the assay rather than a tumoral source. She had imaging tests  that initially showed an ovarian cyst which resolved.  The elevated estrogen was deemed to be due to interference with the assay from her high-dose biotin.  She has a h/o pernicious anemia.  She also has IDA.  She got an iron infusion since last OV:  Ferritin 17 >> 50.  Since last visit was found to have a stable 8 mm pancreatic cyst-no intervention needed, only repeat MRI in 1 year.  She continues to have chronic fatigue and is now looking for confirmation for Lyme disease.  ROS: Constitutional: no weight gain/no weight loss, + fatigue, no subjective hyperthermia, no subjective hypothermia Eyes: no blurry vision, no xerophthalmia ENT: no sore throat, no nodules palpated in neck, no dysphagia, no odynophagia, no hoarseness Cardiovascular: no CP/no SOB/no palpitations/no leg swelling Respiratory: no cough/no SOB/no wheezing Gastrointestinal: no N/no V/no D/no C/no acid reflux Musculoskeletal: no muscle aches/no joint aches Skin: no rashes, no hair loss Neurological: no tremors/no numbness/no tingling/no dizziness  I reviewed pt's medications, allergies, PMH, social hx, family hx, and changes  were documented in the history of present illness. Otherwise, unchanged from my initial visit note.  Past Medical History:  Diagnosis Date  . Allergic rhinitis   . Allergy   . Aneurysm of cardiac wall, congenital    a.  septal aneurysm extending into the RVOT (by cardiac MRI in 08/2013)  . Anxiety   . Aortic atherosclerosis ([place])   . Arthritis   . Asthma   . Basal cell carcinoma   . Depression   . Heart murmur   . Hypothyroidism   . Leukopenia    idiopathic  . Lyme disease   . Mitral valve prolapse   . MVP (mitral valve prolapse)   . Orthostatic hypotension   . Osteoporosis   . Pancreatic cyst   . Peripheral neuropathy 06/09/2012  . Pernicious anemia   . Pernicious anemia   . Pernicious anemia   . Plantar fasciitis   . Pneumothorax    Past Surgical History:  Procedure  Laterality Date  . COLONOSCOPY WITH PROPOFOL N/A 02/22/2013   Procedure: COLONOSCOPY WITH PROPOFOL;  Surgeon: Garlan Fair, MD;  Location: [facility];  Service: Endoscopy;  Laterality: N/A;  . MOUTH SURGERY  01/2012   bone graft in mouth  . NASAL SINUS SURGERY Right 06/12/2014   Procedure: RIGHT ENDOSCOPIC MAXILLARY ANTROSTOMY;  Surgeon: Ascencion Dike, MD;  Location: [facility];  Service: ENT;  Laterality: Right;  . ROOT CANAL  02/27/2012  . TONSILLECTOMY  1962   Social History   Socioeconomic History  . Marital status: Divorced    Spouse name: Not on file  . Number of children: 0  . Years of education: Not on file  . Highest education level: Not on file  Occupational History    Comment: Career and life coach  Tobacco Use  . Smoking status: Never Smoker  . Smokeless tobacco: Never Used  Vaping Use  . Vaping Use: Never used  Substance and Sexual Activity  . Alcohol use: No    Alcohol/week: 0.0 standard drinks  . Drug use: No  . Sexual activity: Not on file  Other Topics Concern  . Not on file  Social History Narrative   Patient is single and lives alone.   Patient is self-employed, career and life coaching.   Patient drinks two to four cups of caffeine daily.   Social Determinants of Health   Financial Resource Strain:   . Difficulty of Paying Living Expenses:   Food Insecurity:   . Worried About Charity fundraiser in the Last Year:   . Arboriculturist in the Last Year:   Transportation Needs:   . Film/video editor (Medical):   [name] Lack of Transportation (Non-Medical):   Physical Activity:   . Days of Exercise per Week:   . Minutes of Exercise per Session:   Stress:   . Feeling of Stress :   Social Connections:   . Frequency of Communication with Friends and Family:   . Frequency of Social Gatherings with Friends and Family:   . Attends Religious Services:   . Active Member of Clubs or Organizations:   . Attends Archivist  Meetings:   [name] Marital Status:   Intimate Partner Violence:   . Fear of Current or Ex-Partner:   . Emotionally Abused:   [name] Physically Abused:   . Sexually Abused:    Current Outpatient Medications on File Prior to Visit  Medication Sig Dispense Refill  . 5-Hydroxytryptophan (5-HTP) 100 MG  CAPS Take 100 mg by mouth daily.     [name] 5-Methyltetrahydrofolate (METHYL FOLATE) POWD Take 680 mcg/L by mouth daily.    . Alpha-Lipoic Acid 600 MG TABS Take 600 mg by mouth daily.    . AMINO ACIDS COMPLEX PO Take 1 Scoop by mouth daily.     Francia Greaves THYROID 30 MG tablet Take 30 mg by mouth daily.  6  . Ascorbic Acid (VITAMIN C PO) Take 2,500 Units by mouth daily.     . Biotin 5 MG TABS Take 1 tablet by mouth daily.    . Cats Claw, Uncaria tomentosa, (CATS CLAW PO) Take by mouth.    . Cholecalciferol (VITAMIN D3) 10000 units capsule Take 10,000 Units by mouth daily.     . Cyanocobalamin (VITAMIN B-12 ER PO) Take 5,000 mcg by mouth daily.     [name] GLUTATHIONE PO Take 250 mg by mouth daily.    . Magnesium Ascorbate POWD Take 600 mg by mouth daily.    [name] MAGNESIUM CITRATE PO Take 800 mg by mouth daily.     . Menaquinone-7 (VITAMIN K2 PO) Take 1 tablet by mouth daily.     No current facility-administered medications on file prior to visit.   Allergies  Allergen Reactions  . Augmentin [Amoxicillin-Pot Clavulanate]   . Clindamycin/Lincomycin    Family History  Problem Relation Age of Onset  . Hyperlipidemia Father   . Heart failure Father   . Prostate cancer Father   . Kidney failure Father   . Hypertension Father   . Angina Mother   . Hypertension Mother   . Lung cancer Mother   . Colon cancer Mother   . Lung cancer Paternal Grandfather   . Breast cancer Paternal Grandmother   . Diabetes Paternal Grandmother   . Esophageal cancer Neg Hx   . Liver cancer Neg Hx   . Pancreatic cancer Neg Hx   . Rectal cancer Neg Hx   . Stomach cancer Neg Hx     PE: BP 102/70   Pulse 62   Ht '5\' 9"'  (1.753 m)    Wt 126 lb (57.2 kg)   SpO2 97%   BMI 18.61 kg/m  Wt Readings from Last 3 Encounters:  02/29/20 126 lb (57.2 kg)  01/18/20 128 lb (58.1 kg)  01/12/20 128 lb (58.1 kg)   Constitutional: normal weight, in NAD Eyes: PERRLA, EOMI, no exophthalmos ENT: moist mucous membranes, no thyromegaly, no cervical lymphadenopathy Cardiovascular: RRR, No MRG Respiratory: CTA B Gastrointestinal: abdomen soft, NT, ND, BS+ Musculoskeletal: no deformities, strength intact in all 4 Skin: moist, warm, no rashes Neurological: no tremor with outstretched hands, DTR normal in all 4  Assessment: 1. Osteoporosis  2.  Hypothyroidism  3.  Elevated estrogen -This was due to biotin interference with the assay -No further follow-up is needed for this  Other treating physicians:  Dr. Ronita Hipps  Dr. August Luz [facility] [place] [address]  Plan: 1. Osteoporosis -Likely H-related/postmenopausal + she also has family history of osteoporosis.  She has a history of drinking a lot of coffee before, now lower intake.  She feels that this may have contributed. -No fractures since last visit -Reviewed her DEXA scans from 2018-2020 and T-scores from 2020 were consistent with the ones from 2018.  However, they are still in the osteoporotic range and she is still at high risk for fractures.  At today's visit, she also brings her bone density scan from 11/09/2019 and this showed  stable T-scores at the spine, however, decreased in T-scores at the right and left femoral neck. -At every visit, including today, we discussed about options for treatment.  She refuses antiresorptive medication and bone anabolic agents.  Her bone specialist advised her to consider [place].  We discussed about this today.  I advised her that this is usually a last resort for treatment and would not use this before trying Tymlos/Forteo or an antiresorptive.  She would not want to try any of these for now. - She is on many  supplements for bone maintenance.  I would not recommend estrogen now more than 10 years after menopause due to the risk of thrombosis and breast cancer.  In the past, we stop vitamin A since in excess of this can lead to osteoporosis.  She did go to the [place] [facility] in the past but she felt that this caused headaches and also muscle/joint aches.  However, at last visit, she was considering going back.   -At last visit, we discussed about improving exercise: Moving this after meals, rather than before meals; walking more downhill compared to left heel; using weights (backpack, and weights when walking, etc. During the pandemic she walks 3 to 5 miles a day.  She now restarted to go to the gym.  She also does balancing exercises. -She plans to buy a vibration platform.  She is 19 and her chiropractor's office and she liked it. -She also started drops of a compounded hormonal supplements: Estrogen, testosterone, progesterone.  She feels completely revigorated and much better on the supplement. -She continues on a low acid diet.   -She continues to see an osteoporosis specialist who orders her bone densities -For now, we decided to continue with her HRT supplements, vibration platform, and weightbearing and balance exercises and recheck her bone density in a year -We will check a vitamin D today -She will return in 1 year  2. Hypothyroidism -She had no clear diagnosis of hypothyroidism when her treatment with thyroid hormones was started-she was started based on symptoms.  States her TFTs remained normal afterwards, we continued her low-dose supplementation.  One of her specialists wanted to take her off thyroid hormones but she felt poorly and restarted. - latest thyroid labs reviewed with pt >> normal in 01/2019  - she continues on Armour 30 mg daily - pt feels good on this dose. - we discussed about taking the thyroid hormone every day, with water, >30 minutes before breakfast, separated by >4  hours from acid reflux medications, calcium, iron, multivitamins. Pt. is taking it correctly. - will check thyroid tests today: TSH, free T3 and fT4 - If labs are abnormal, she will need to return for repeat TFTs in 1.5 months  Component     Latest Ref Rng & Units 02/29/2020  Triiodothyronine,Free,Serum     2.3 - 4.2 pg/mL 5.7 (H)  T4,Free(Direct)     0.60 - 1.60 ng/dL 0.80  TSH     0.35 - 4.50 uIU/mL 1.96  Vitamin D, 25-Hydroxy     30.0 - 100.0 ng/mL 63.8  TFTs and vitamin D are at goal.  Philemon Kingdom, MD PhD [facility]

## 2020-02-29 NOTE — Patient Instructions (Addendum)
Please continue Armour 30 mg daily.  Take the thyroid hormone every day, with water, at least 30 minutes before breakfast, separated by at least 4 hours from: - acid reflux medications - calcium - iron - multivitamins  Please come back for a follow-up appointment in 1 year. 

## 2020-03-01 ENCOUNTER — Ambulatory Visit: Payer: Medicare Other | Admitting: General Practice

## 2020-03-01 LAB — VITAMIN D 25 HYDROXY (VIT D DEFICIENCY, FRACTURES): Vit D, 25-Hydroxy: 63.8 ng/mL (ref 30.0–100.0)

## 2020-03-14 NOTE — Progress Notes (Signed)
Cardiology Clinic Note   Patient Name: Megan [name] Date of Encounter: 03/15/2020  Primary Care Provider:  Lavone Orn, MD Primary Cardiologist:  Kirk Ruths, MD  Patient Profile    Megan [name] 64 year old female presents today for a follow-up evaluation of her irregular heartbeat.  Past Medical History    Past Medical History:  Diagnosis Date  . Allergic rhinitis   . Allergy   . Aneurysm of cardiac wall, congenital    a.  septal aneurysm extending into the RVOT (by cardiac MRI in 08/2013)  . Anxiety   . Aortic atherosclerosis ([place])   . Arthritis   . Asthma   . Basal cell carcinoma   . Depression   . Heart murmur   . Hypothyroidism   . Leukopenia    idiopathic  . Lyme disease   . Mitral valve prolapse   . MVP (mitral valve prolapse)   . Orthostatic hypotension   . Osteoporosis   . Pancreatic cyst   . Peripheral neuropathy 06/09/2012  . Pernicious anemia   . Pernicious anemia   . Pernicious anemia   . Plantar fasciitis   . Pneumothorax    Past Surgical History:  Procedure Laterality Date  . COLONOSCOPY WITH PROPOFOL N/A 02/22/2013   Procedure: COLONOSCOPY WITH PROPOFOL;  Surgeon: Garlan Fair, MD;  Location: [facility];  Service: Endoscopy;  Laterality: N/A;  . MOUTH SURGERY  01/2012   bone graft in mouth  . NASAL SINUS SURGERY Right 06/12/2014   Procedure: RIGHT ENDOSCOPIC MAXILLARY ANTROSTOMY;  Surgeon: Ascencion Dike, MD;  Location: [facility];  Service: ENT;  Laterality: Right;  . ROOT CANAL  02/27/2012  . TONSILLECTOMY  1962    Allergies  Allergies  Allergen Reactions  . Augmentin [Amoxicillin-Pot Clavulanate]   . Clindamycin/Lincomycin     History of Present Illness    Megan [name] has a PMH of mitral valve prolapse, dyspnea, hyperlipidemia, and iron deficiency anemia.  An MRA 1/15 showed aneurysmal dilation of the membranous ventricular septum extending into the right ventricular outflow tract.  There is no sign of  Valsalva aneurysm.  Her ejection fraction was 57%.  Carotid Dopplers 8/16 were normal.  Echocardiogram 5/18 showed normal LV systolic function and mild mitral regurgitation.  Lower extremity arterial Dopplers 5/18 were normal.  Stress echo 2/19 was normal.  CTA June 2019 showed aneurysm of the membranous ventricular septum that bulges into the right ventricular outflow tract.  There was no connection between the left ventricle and the right ventricle.  There is no sinus of Valsalva aneurysm.  Calcium score was 0.  No coronary disease.  Her ABIs 8/20 showed noncompressible left lower extremity artery and normal right.  Abdominal CT 11/20 showed 9 mm cystic lesion in the tail of the pancreas, follow-up recommended 12 months.  Aortic atherosclerosis was also noted.  Her most recent lipid profile 01/05/2020 showed total cholesterol 239, LDL 131 and HDL 99.  She presented to the clinic on 01/10/2020.  She had been monitoring her blood pressure at home.  Her blood pressure monitor  indicated that she had intermittent irregular heartbeats.    She also noticed that since receiving the [employer] vaccine she  had lower blood pressures.  She presented to her PCP who instructed her to increase her fluid and sodium intake.  She stated she had a large reduction in blood pressure during orthostatic blood pressures at that time.  She continued to have occasional dizziness with changes  in body position.  She did feel that she was having less dizziness with her increased fluid consumption and adding Himalayan salt to her water.  I will ordered a 14-day ZIO monitor for palpitations, had her increase her fluid consumption, increase her physical activity as tolerated, recommened wear lower extremity support stockings, and set follow-up for 6 weeks.  Her cardiac event monitor showed sinus bradycardia, normal sinus rhythm, sinus tachycardia, occasional PACs and PVCs and brief PAT.  No medication changes were recommended.  She  presents to the clinic today for follow-up evaluation and states her blood pressure has been in the mid 90s to low 100s. She has had much fewer episodes of lightheadedness. She states that she continues to have occasional episodes of palpitations. Her 14-day ZIO monitor results were reviewed and all questions answered. She is concerned about getting a booster COVID-19 vaccination. She has started an exercise routine with a vibration plate/balance plate and is having good results with her pedal neuropathy. She is also eliminated coconut oil from her diet in hopes that it will lower her cholesterol. We discussed waiting for more information to come out before pursuing a booster injection. I will have her follow-up with Dr. Stanford Breed in 3 to 4 months.  Today she denies chest pain, shortness of breath, lower extremity edema, fatigue, palpitations, melena, hematuria, hemoptysis, diaphoresis, weakness, presyncope, syncope, orthopnea, and PND.   Home Medications    Prior to Admission medications   Medication Sig Start Date End Date Taking? Authorizing Provider  5-Hydroxytryptophan (5-HTP) 100 MG CAPS Take 100 mg by mouth daily.     [provider]  5-Methyltetrahydrofolate (METHYL FOLATE) POWD Take 680 mcg/L by mouth daily.    [provider]  Alpha-Lipoic Acid 600 MG TABS Take 600 mg by mouth daily.    [provider]  AMINO ACIDS COMPLEX PO Take 1 Scoop by mouth daily.     [provider]  ARMOUR THYROID 30 MG tablet Take 30 mg by mouth daily. 11/20/16   [provider]  Ascorbic Acid (VITAMIN C PO) Take 2,500 Units by mouth daily.     [provider]  Biotin 5 MG TABS Take 1 tablet by mouth daily.    [provider]  Cats Claw, Uncaria tomentosa, (CATS CLAW PO) Take by mouth.    [provider]  Cholecalciferol (VITAMIN D3) 10000 units capsule Take 10,000 Units by mouth daily.     [provider]  Cyanocobalamin (VITAMIN  B-12 ER PO) Take 5,000 mcg by mouth daily.     [provider]  GLUTATHIONE PO Take 250 mg by mouth daily.    [provider]  Magnesium Ascorbate POWD Take 600 mg by mouth daily.    [provider]  MAGNESIUM CITRATE PO Take 800 mg by mouth daily.     [provider]  Menaquinone-7 (VITAMIN K2 PO) Take 1 tablet by mouth daily.    [provider]  NONFORMULARY OR COMPOUNDED ITEM 2 drops in the morning and at bedtime. Estradiol-Progesterone 0.06-5-0.33 MG liquid drops    [provider]  NONFORMULARY OR COMPOUNDED ITEM Place 1 mg vaginally. Estriol VB 1MG  0.1% cream    [provider]    Family History    Family History  Problem Relation Age of Onset  . Hyperlipidemia Father   . Heart failure Father   . Prostate cancer Father   . Kidney failure Father   . Hypertension Father   . Angina Mother   .  Hypertension Mother   . Lung cancer Mother   . Colon cancer Mother   . Lung cancer Paternal Grandfather   . Breast cancer Paternal Grandmother   . Diabetes Paternal Grandmother   . Esophageal cancer Neg Hx   . Liver cancer Neg Hx   . Pancreatic cancer Neg Hx   . Rectal cancer Neg Hx   . Stomach cancer Neg Hx    She indicated that her mother is deceased. She indicated that her father is deceased. She indicated that her maternal grandmother is deceased. She indicated that her maternal grandfather is deceased. She indicated that her paternal grandmother is deceased. She indicated that her paternal grandfather is deceased. She indicated that the status of her neg hx is unknown.  Social History    Social History   Socioeconomic History  . Marital status: Divorced    Spouse name: Not on file  . Number of children: 0  . Years of education: Not on file  . Highest education level: Not on file  Occupational History    Comment: Career and life coach  Tobacco Use  . Smoking status: Never Smoker  . Smokeless tobacco: Never Used    Vaping Use  . Vaping Use: Never used  Substance and Sexual Activity  . Alcohol use: No    Alcohol/week: 0.0 standard drinks  . Drug use: No  . Sexual activity: Not on file  Other Topics Concern  . Not on file  Social History Narrative   Patient is single and lives alone.   Patient is self-employed, career and life coaching.   Patient drinks two to four cups of caffeine daily.   Social Determinants of Health   Financial Resource Strain:   . Difficulty of Paying Living Expenses:   Food Insecurity:   . Worried About Charity fundraiser in the Last Year:   . Arboriculturist in the Last Year:   Transportation Needs:   . Film/video editor (Medical):   [name] Lack of Transportation (Non-Medical):   Physical Activity:   . Days of Exercise per Week:   . Minutes of Exercise per Session:   Stress:   . Feeling of Stress :   Social Connections:   . Frequency of Communication with Friends and Family:   . Frequency of Social Gatherings with Friends and Family:   . Attends Religious Services:   . Active Member of Clubs or Organizations:   . Attends Archivist Meetings:   [name] Marital Status:   Intimate Partner Violence:   . Fear of Current or Ex-Partner:   . Emotionally Abused:   [name] Physically Abused:   . Sexually Abused:      Review of Systems    General:  No chills, fever, night sweats or weight changes.  Cardiovascular:  No chest pain, dyspnea on exertion, edema, orthopnea, palpitations, paroxysmal nocturnal dyspnea. Dermatological: No rash, lesions/masses Respiratory: No cough, dyspnea Urologic: No hematuria, dysuria Abdominal:   No nausea, vomiting, diarrhea, bright red blood per rectum, melena, or hematemesis Neurologic:  No visual changes, wkns, changes in mental status. All other systems reviewed and are otherwise negative except as noted above.  Physical Exam    VS:  BP 108/64   Pulse 62   Ht 5' 9.25" (1.759 m)   Wt 125 lb 12.8 oz (57.1 kg)   SpO2 99%    BMI 18.44 kg/m  , BMI Body mass index is 18.44 kg/m. GEN: Well nourished, well developed, in no  acute distress. HEENT: normal. Neck: Supple, no JVD, carotid bruits, or masses. Cardiac: RRR, no murmurs, rubs, or gallops. No clubbing, cyanosis, edema.  Radials/DP/PT 2+ and equal bilaterally.  Respiratory:  Respirations regular and unlabored, clear to auscultation bilaterally. GI: Soft, nontender, nondistended, BS + x 4. MS: no deformity or atrophy. Skin: warm and dry, no rash. Neuro:  Strength and sensation are intact. Psych: Normal affect.  Accessory Clinical Findings    Recent Labs: 11/21/2019: Hemoglobin 13.9; Platelets 187 12/20/2019: ALT 18; BUN 15; Creatinine, Ser 0.66; Potassium 4.1; Sodium 140 02/29/2020: TSH 1.96   Recent Lipid Panel    Component Value Date/Time   CHOL 205 (H) 01/05/2020 0833   TRIG 50 01/05/2020 0833   HDL 73 01/05/2020 0833   CHOLHDL 2.8 01/05/2020 0833   CHOLHDL 4.3 07/19/2008 0625   VLDL 12 07/19/2008 0625   LDLCALC 123 (H) 01/05/2020 0833    ECG personally reviewed by me today-none today.  EKG 01/10/2020 Normal sinus rhythm 69 bpm  EKG 07/05/2019 Normal sinus rhythm 67 bpm  Echocardiogram 01/06/2017 Study Conclusions   - Left ventricle: The cavity size was normal. Wall thickness was  normal. Systolic function was normal. The estimated ejection  fraction was in the range of 60% to 65%. Wall motion was normal;  there were no regional wall motion abnormalities. Left  ventricular diastolic function parameters were normal.  - Mitral valve: There was mild regurgitation.   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  Coronary CTA 01/28/2018 FINDINGS: Non-cardiac: See separate report from [facility].  There is an aneurysm of the membranous ventricular septum that bulges into the RV outflow tract, it  is about 1.5 cm deep. There does not appear to be a connection between LV and RV (no contrast extravasation). This is not a sinus of Valsalva aneurysm (it is proximal to aortic valve in the membranous ventricular septum).  Calcium Score: 0 Agatston units.  Coronary Arteries: Right dominant with no anomalies  LM: No plaque or stenosis.  LAD system: No plaque or stenosis.  Circumflex system: No plaque or stenosis.  RCA system: No plaque or stenosis.  IMPRESSION: 1. Aneurysm of the membranous ventricular septum bulging into the RVOT, there does not appear to be a an actual VSD. No sinus of Valsalva aneurysm.  2. Coronary artery calcium score 0 Agatston units, suggesting low risk for future cardiac events.  3. No plaque or stenosis noted in the coronary tree.   Cardiac event monitor 02/21/2020 Sinus bradycardia, normal sinus rhythm, sinus tachycardia, occasional PACs, PVCs, rare couplet and brief PAT. [place]   1.   Irregular heartbeat/palpitations -heart rate today 62.  Has noticed less alerts on her blood pressure monitor.    Continues to be asymptomatic.  Cardiac event monitor showed sinus bradycardia, NSR, sinus tachycardia, occasional PACs, PVCs, and rare couplet and brief PAT. Avoid caffeine, chocolate, EtOH etc. Heart healthy diet Increase physical activity as tolerated  Orthostatic hypotension-BP today  108/64. Well-controlled at home. Continue increase p.o. fluid intake, liberalize salt Lower extremity support stockings-[place] sheet given Increase physical activity as tolerated  Chest pain-no recent episodes of chest discomfort/pain.  01/28/2018 coronary CTA showed calcium score of 0.  Aneurysmal dilation of interventricular septum-noted on coronary CTA and showed no VSD and no sinus of Valsalva aneurysm.  Follow-up with Dr. Stanford Breed in 3-4 months.  Jossie Ng. Jylian Pappalardo NP-C    03/15/2020, 9:46 AM [place] [address] Office [phone] Fax [phone]

## 2020-03-15 ENCOUNTER — Ambulatory Visit (INDEPENDENT_AMBULATORY_CARE_PROVIDER_SITE_OTHER): Payer: Medicare Other | Admitting: General Practice

## 2020-03-15 ENCOUNTER — Other Ambulatory Visit: Payer: Self-pay

## 2020-03-15 ENCOUNTER — Encounter: Payer: Self-pay | Admitting: General Practice

## 2020-03-15 VITALS — BP 108/64 | HR 62 | Ht 69.25 in | Wt 125.8 lb

## 2020-03-15 DIAGNOSIS — R072 Precordial pain: Secondary | ICD-10-CM

## 2020-03-15 DIAGNOSIS — R002 Palpitations: Secondary | ICD-10-CM | POA: Diagnosis not present

## 2020-03-15 DIAGNOSIS — I951 Orthostatic hypotension: Secondary | ICD-10-CM

## 2020-03-15 DIAGNOSIS — I253 Aneurysm of heart: Secondary | ICD-10-CM | POA: Diagnosis not present

## 2020-03-15 NOTE — Patient Instructions (Signed)
Medication Instructions:  The current medical regimen is effective;  continue present plan and medications as directed. Please refer to the Current Medication list given to you today. *If you need a refill on your cardiac medications before your next appointment, please call your pharmacy*  Follow-Up: Your next appointment:  4 month(s)  In Person with Kirk Ruths, MD  At [facility], you and your health needs are our priority.  As part of our continuing mission to provide you with exceptional heart care, we have created designated Provider Care Teams.  These Care Teams include your primary Cardiologist (physician) and Advanced Practice Providers (APPs -  Physician Assistants and Nurse Practitioners) who all work together to provide you with the care you need, when you need it.

## 2020-03-19 ENCOUNTER — Other Ambulatory Visit: Payer: Self-pay

## 2020-03-19 ENCOUNTER — Encounter: Payer: Self-pay | Admitting: Podiatrist

## 2020-03-19 ENCOUNTER — Ambulatory Visit (INDEPENDENT_AMBULATORY_CARE_PROVIDER_SITE_OTHER): Payer: Medicare Other | Admitting: Podiatrist

## 2020-03-19 ENCOUNTER — Ambulatory Visit: Payer: BC Managed Care – PPO | Admitting: Neurology

## 2020-03-19 DIAGNOSIS — M79674 Pain in right toe(s): Secondary | ICD-10-CM | POA: Diagnosis not present

## 2020-03-19 DIAGNOSIS — S90121A Contusion of right lesser toe(s) without damage to nail, initial encounter: Secondary | ICD-10-CM | POA: Diagnosis not present

## 2020-03-19 DIAGNOSIS — M79675 Pain in left toe(s): Secondary | ICD-10-CM

## 2020-03-19 NOTE — Progress Notes (Signed)
  Chief Complaint  Patient presents with  . Nail Problem    Bilateral hallux, lateral borders. x3 months. Pt stated, "The pain began after a pedicure - I wonder if I was cut when she was trimming my cuticles. No drainage or bleeding. Pain can be up to 7/10".     HPI: Patient is 64 y.o. female who presents today for the concerns as listed above. She relates she noticed the pan on the lateral sides of both great toes after a pedicure where the cuticles were trimmed.  She noticed they were harder on the borders.  She also relates a change in the right second toenail after bumping it under her bed.  Relates no pain.    Review of Systems No fevers, chills, nausea, muscle aches, no difficulty breathing, no calf pain, no chest pain or shortness of breath.   Physical Exam  GENERAL APPEARANCE: Alert, conversant. Appropriately groomed. No acute distress.   VASCULAR: Pedal pulses palpable DP and PT bilateral.  Capillary refill time is immediate to all digits,  Proximal to distal cooling it warm to warm.  Digital perfusion adequate.   NEUROLOGIC: sensation is intact and unchanged from the previous visit.  MUSCULOSKELETAL: acceptable muscle strength, tone and stability bilateral.  No gross boney pedal deformities noted.    DERMATOLOGIC: skin is warm, supple, and dry.  No open lesions noted.  No rash, no pre ulcerative lesions. Right second toenail has an area where the nail has a bump in it from the trauma-  It should grow out and a normal nail should follow.  Her bilateral hallux nails have a slight area of redness along the lateral nail folds-  No drainage, no obvious sign of infection noted.  Hallux and second toes rub together likely causing the nail to become irritated.    Assessment     ICD-10-CM   1. Toe pain, bilateral  M79.674    M79.675   2. Contusion of lesser toe of right foot without damage to nail, initial encounter  E10.071Q      Plan  Discussed treatment options and  alternatives.  Discussed the nails look to be wider on the lateral side which could be contributing to her discomfort- however they don't appear infected or ingrown so I would encourage conservative care first-  I dispensed a spacer to wear between the first and second toes to keep pressure off the lateral nails,  Encouraged continued use of vaseline to keep the sides of the nail soft,  Discussed keeping the nails a little shorter and [facility] a place she may want to try.  She will try conservative therapies first and will call if these fail.

## 2020-03-19 NOTE — Patient Instructions (Signed)
"  [facility]" in [place] [facility] has been recommended as a good nail salon to try.

## 2020-03-23 ENCOUNTER — Other Ambulatory Visit: Payer: Self-pay

## 2020-03-23 ENCOUNTER — Ambulatory Visit (INDEPENDENT_AMBULATORY_CARE_PROVIDER_SITE_OTHER): Payer: Medicare Other | Admitting: Psychiatry

## 2020-03-23 DIAGNOSIS — R6889 Other general symptoms and signs: Secondary | ICD-10-CM | POA: Diagnosis not present

## 2020-03-23 DIAGNOSIS — F411 Generalized anxiety disorder: Secondary | ICD-10-CM

## 2020-03-23 DIAGNOSIS — F341 Dysthymic disorder: Secondary | ICD-10-CM

## 2020-03-23 DIAGNOSIS — D8989 Other specified disorders involving the immune mechanism, not elsewhere classified: Secondary | ICD-10-CM

## 2020-03-23 DIAGNOSIS — Z87898 Personal history of other specified conditions: Secondary | ICD-10-CM

## 2020-03-23 DIAGNOSIS — K089 Disorder of teeth and supporting structures, unspecified: Secondary | ICD-10-CM

## 2020-03-23 DIAGNOSIS — G63 Polyneuropathy in diseases classified elsewhere: Secondary | ICD-10-CM

## 2020-03-23 DIAGNOSIS — Z77120 Contact with and (suspected) exposure to mold (toxic): Secondary | ICD-10-CM

## 2020-03-23 DIAGNOSIS — G8929 Other chronic pain: Secondary | ICD-10-CM

## 2020-03-23 DIAGNOSIS — Z8709 Personal history of other diseases of the respiratory system: Secondary | ICD-10-CM

## 2020-03-23 NOTE — Progress Notes (Signed)
Psychotherapy Progress Note [facility] Luan Moore, PhD LP   Patient ID: Megan [name]     MRN: [id number] Therapy format: Individual psychotherapy Date: 03/23/2020      Start: 2:15p     Stop: 3:05p     Time Spent: 50 min Location: In-person   Session narrative (presenting needs, interim history, self-report of stressors and symptoms, applications of prior therapy, status changes, and interventions made in session) Despite a 3-1/2-week weight for follow-up, states she did well.  Summary of first session that we seem to focus on her having To live long enough but still being leery of risk getting to know others and being new to feeling better since hormone therapy was unexpectedly effective.  Reviewed a run of hardships and living and learned reluctance to be vulnerable or emotional with others, with her coaching role being safer somehow.  Noted that a life lesson has been that, with all the alcoholics imposters out there, he have to be wary of getting involved with people.  Also noted being hungry for touch, and if there is a safe touch after to be had, it is worth finding, whether it would be a lumbar, even this far into life, or a service such as massage.  Acknowledged also the abrupt ending with former therapist.  Has tried to consider her own self-critical thoughts and answer herself from the perspective of coaching, rather helpful.  Worked through apprehensions about having a friend visit her, in particular.  Today notes having an orphaned feeling in life, hungry for more of a spiritual connection.  She grew up [employer] but rejected orthodoxy sometime ago.  Still, has had to [facility] with whom she had a good connection and relates a particularly warm and inspiring experience with a Clayton Lefort, father Pilar Plate, who opened her eyes more to grace and a more progressive Catholicism than she imagined possible.  Locally, she has found Father William Hamburger, another [place]  of some reputation.  Meanwhile, she has been looking into the [employer] and has a growing interest in the possibility of psychedelic treatment.  Asks [place] if it holds any promise for helping her turn off the doubt and self-criticism she feels.  Again, I have little acquaintance with this method, discussed what she knows about it and what sort of promises it makes.  Noted that a colleague, Dr. Clovis Pu, does run a ketamine treatment, but I do not know details.  After session, solicited information from staff who interact with it more, provided brochure, made initial contact with the nurse involved to potentially brief PT on what is offered here.  Reports continuing to feel a "humming instinct" to visit [place] in September, but wonders if this is about her "real home" or a "geographic cure" at work in her.  Suggested that geographic cure may be too harsh judgment, and that she can very well have an interest in seeing a place for many reasons, including familiarity, sense of history, beauty, charm, and the impulse to visit and the impulse to live there may not be that distinguishable from [place].  If she does figure on possibly living there, it would behoove her to get in touch with locals, ask questions and make observations about life there and how things are done before staking a wholesale move.  Obviously, there are concerns about established business and healthcare relationships to weigh, also.  Recounts more about her family of origin, particularly how her mother was a loud alcoholic and her  father classically codependent with her.  PT learned to mistrust her own feelings, prompting contradictory impulses to play it safe and be adventurous without counting the cost.  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  Finds herself trusting it so far, not panicking.  Feels she is in a financially stable and reliable place so far, living in a paid-for home which used to be her parents' place in which she already went through to purge unnecessary and toxic belongings.  Note she has received shamanic readings and advice in the past which seem to be on target.  Previous treatment in conventional psychiatry included SSRIs then Effexor for a course of about 30 years.  When she elected to go off pharmaceuticals, it was an 72-month weaning process.  Return to the subject of feeling orphaned, interpreted suppressed extensive childhood and proposed exploring things that would have been appropriate to childhood and made available in a more supportive environment.  Discussed possibilities of browsing in children's reading section in a bookstore, getting hold of some Play-Doh, or using crayons.  Therapeutic modalities: Cognitive Behavioral Therapy, Solution-Oriented/Positive Psychology and Humanistic/Existential  Mental Status/Observations:  Appearance:   Casual     Behavior:  Appropriate  Motor:  Normal  Speech/Language:   Clear and Coherent  Affect:  Appropriate  Mood:  normal, situational anxiety  Thought process:  normal  Thought content:    WNL  Sensory/Perceptual disturbances:    WNL  Orientation:  Fully oriented  Attention:  Good    Concentration:  Good  Memory:  WNL  Insight:    Good  Judgment:   Good  Impulse Control:  Good   Risk Assessment: Danger to Self: No Self-injurious Behavior: No Danger to Others: No Physical Aggression / Violence: No Duty to Warn: No Access to Firearms a concern: No  Assessment of progress:  progressing  Diagnosis:   ICD-10-CM   1. Dysthymic disorder  F34.1   2. Generalized anxiety disorder  F41.1   3. History of alcohol use disorder  Z87.898   4. Suspected Lyme disease  R68.89   5. Personal history of lung disease  Z87.09   6. Immune-mediated neuropathy (HCC)   D89.89    G63   7. Mold exposure  Z77.120   8. Chronic dental pain  K08.9    G89.29    Plan:  . Experiment, as interested, with browsing children section, using Play-Doh, and/or crayons to provide herself a sense of play and freedom to do so . Other recommendations/advice as may be noted above . Continue to utilize previously learned skills ad lib . Maintain medication as prescribed and work faithfully with relevant prescriber(s) if any changes are desired or seem indicated . Call the clinic on-call service, present to ER, or call 911 if any life-threatening psychiatric crisis Return in about 2 weeks (around 04/06/2020). . Already scheduled visit in this office 04/03/2020.  Blanchie Serve, PhD Luan Moore, PhD LP Clinical Psychologist, [facility] [address] [place], [place] [address] [phone]

## 2020-04-03 ENCOUNTER — Other Ambulatory Visit: Payer: Self-pay

## 2020-04-03 ENCOUNTER — Ambulatory Visit (INDEPENDENT_AMBULATORY_CARE_PROVIDER_SITE_OTHER): Payer: Medicare Other | Admitting: Psychiatry

## 2020-04-03 DIAGNOSIS — R6889 Other general symptoms and signs: Secondary | ICD-10-CM

## 2020-04-03 DIAGNOSIS — F411 Generalized anxiety disorder: Secondary | ICD-10-CM

## 2020-04-03 DIAGNOSIS — Z87898 Personal history of other specified conditions: Secondary | ICD-10-CM | POA: Diagnosis not present

## 2020-04-03 DIAGNOSIS — R5382 Chronic fatigue, unspecified: Secondary | ICD-10-CM | POA: Diagnosis not present

## 2020-04-03 DIAGNOSIS — F341 Dysthymic disorder: Secondary | ICD-10-CM
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
## 2020-04-06 ENCOUNTER — Other Ambulatory Visit: Payer: Self-pay | Admitting: Otolaryngology

## 2020-04-06 DIAGNOSIS — J329 Chronic sinusitis, unspecified: Secondary | ICD-10-CM

## 2020-04-16 ENCOUNTER — Other Ambulatory Visit: Payer: Medicare Other

## 2020-04-17 ENCOUNTER — Ambulatory Visit (INDEPENDENT_AMBULATORY_CARE_PROVIDER_SITE_OTHER): Payer: Medicare Other | Admitting: Psychiatry

## 2020-04-17 ENCOUNTER — Other Ambulatory Visit: Payer: Self-pay

## 2020-04-17 DIAGNOSIS — F341 Dysthymic disorder: Secondary | ICD-10-CM | POA: Diagnosis not present

## 2020-04-17 DIAGNOSIS — R6889 Other general symptoms and signs: Secondary | ICD-10-CM

## 2020-04-17 DIAGNOSIS — F411 Generalized anxiety disorder: Secondary | ICD-10-CM

## 2020-04-17 DIAGNOSIS — Z8709 Personal history of other diseases of the respiratory system: Secondary | ICD-10-CM | POA: Diagnosis not present

## 2020-04-17 DIAGNOSIS — Z77120 Contact with and (suspected) exposure to mold (toxic): Secondary | ICD-10-CM

## 2020-04-17 DIAGNOSIS — F4010 Social phobia, unspecified: Secondary | ICD-10-CM

## 2020-04-17 NOTE — Progress Notes (Addendum)
Psychotherapy Progress Note [facility] Luan Moore, PhD LP  Patient ID: Megan [name]     MRN: [id number] Therapy format: Individual psychotherapy Date: 04/17/2020      Start: 2:20p     Stop: 3:10p     Time Spent: 50 min Location: In-person   Session narrative (presenting needs, interim history, self-report of stressors and symptoms, applications of prior therapy, status changes, and interventions made in session) Discussed billing issue, addressed inflated fear of Medicare rejecting claims and being stuck with extra expenses.    Had been thinking about visiting GM's grave but figures no answers to be had, particularly, about her origins.  Looking at family pictures lately that illustrate dour, negative family of origin.  Sees hardship, woe, negativity there, whatever the cause.  Relates history of pot use and being the smart party girl in her youth.  Participated in [employer].  Further discussed interest in psychedelic treatment, informed that the way it is done here, it is not a guided experience but merely the administration of a drug and safe, supervised passage through the high, trusting that neurochemical settings simply benefit.  It does not sound like the transcendent, guided experience she hopes to have, and, in fact, going through this way could be setting her up for disappointment and possibly recapitulate the experience of hoping for connection and having it denied which she experienced growing up.  Working on an [employer], 21-day.  Drawing pictures of herself with various prompts.  Hx of somatic illness at Christmas time, may have been cry for help, may have been avoiding.  Has had panic reactions as an adult, tied largely to feeling alien and crises of confidence.  History of lung disease, possibly related to mold exposure, which was intimidating.  Discussed again chances worth taking -- commit to the trip to [place], maybe more social  connections here.  Therapeutic modalities: Cognitive Behavioral Therapy, Solution-Oriented/Positive Psychology and Humanistic/Existential  Mental Status/Observations:  Appearance:   Casual and Neat     Behavior:  Appropriate  Motor:  Normal  Speech/Language:   Clear and Coherent  Affect:  Appropriate  Mood:  normal and apprehensive, under control  Thought process:  normal  Thought content:    WNL  Sensory/Perceptual disturbances:    WNL  Orientation:  Fully oriented  Attention:  Good    Concentration:  Good  Memory:  WNL  Insight:    Good  Judgment:   Good  Impulse Control:  Good   Risk Assessment: Danger to Self: No Self-injurious Behavior: No Danger to Others: No Physical Aggression / Violence: No Duty to Warn: No Access to Firearms a concern: No  Assessment of progress:  progressing  Diagnosis:   ICD-10-CM   1. Generalized anxiety disorder  F41.1   2. Dysthymic disorder  F34.1   3. Personal history of lung disease  Z87.09   4. Social anxiety disorder  F40.10   5. Suspected Lyme disease  R68.89   6. Mold exposure  Z77.120    Plan:  . Continue self-directed exploration, use of play modalities . Likely not psychedelic treatment . Other recommendations/advice as may be noted above . Continue to utilize previously learned skills ad lib . Maintain medication as prescribed and work faithfully with relevant prescriber(s) if any changes are desired or seem indicated . Call the clinic on-call service, present to ER, or call 911 if any life-threatening psychiatric crisis Return in about 2 weeks (around 05/01/2020) for time as available. [name]  Already scheduled visit in this office 05/02/2020.  Blanchie Serve, PhD Luan Moore, PhD LP Clinical Psychologist, [facility] [address] [place], [place] [address] [phone]

## 2020-04-27 ENCOUNTER — Ambulatory Visit: Payer: Medicare Other | Admitting: Neurology

## 2020-05-01 ENCOUNTER — Other Ambulatory Visit: Payer: Self-pay

## 2020-05-01 DIAGNOSIS — R202 Paresthesia of skin: Secondary | ICD-10-CM

## 2020-05-02 ENCOUNTER — Encounter: Payer: Self-pay | Admitting: Internal Medicine

## 2020-05-02 ENCOUNTER — Ambulatory Visit: Payer: Medicare Other | Admitting: Psychiatry

## 2020-05-15 ENCOUNTER — Other Ambulatory Visit: Payer: Self-pay

## 2020-05-15 ENCOUNTER — Ambulatory Visit (INDEPENDENT_AMBULATORY_CARE_PROVIDER_SITE_OTHER): Payer: Medicare Other | Admitting: Psychiatry

## 2020-05-15 DIAGNOSIS — F411 Generalized anxiety disorder: Secondary | ICD-10-CM

## 2020-05-15 DIAGNOSIS — F341 Dysthymic disorder: Secondary | ICD-10-CM

## 2020-05-15 NOTE — Progress Notes (Signed)
Psychotherapy Progress Note [facility] Luan Moore, PhD LP  Patient ID: Megan [name]     MRN: [id number] Therapy format: Individual psychotherapy Date: 05/15/2020      Start: 3:18p     Stop: 4:08p     Time Spent: 50 min Location: In-person   Session narrative (presenting needs, interim history, self-report of stressors and symptoms, applications of prior therapy, status changes, and interventions made in session) Been considering where she wants to "stretch", but life has been anxiety-provoking enough.  Today needed an anxiety break, took L-theanine and lay on yoga mat with heat, helpful.  Money a large concern right now with much-reduced income and the prospect of having to make retirement fund decisions.    Called off [place] trip due to several things, but feeling loss of connection and prospect after 18 months of waiting.  Continues to struggle with whether she is experiencing a true call to another place as the prominence of feeling at home versus whether she needs to learn how to be where she is.  Aware that she has some roots put down here, even if the neighborhood she is in only has remnants of her parents' social set and few people who would truly appeal to her, and she has established health care relationships.  Discussed other social connections, including, as is appropriate to pandemic, virtual opportunities to see and meet people.  Still has her circle of women group, may like to encounter others beyond that.  Discussed the possibility of looking at [employer] and investigating progressive churches and college and university offerings to find other intellectuals.  Noted that reconnaissance is not the same thing as committing to meet people, for the sake of social anxiety that tends to come up thinking about it.  Refreshed understanding that she had something of an impoverished learning experience stretching out to befriend others in her upbringing, partly for the  tension of being only child, and partly for the frequent uprooting her family did.  Normalized learning not to reach out then, and the residual lack of confidence that sort of thing may have, even today, especially for having bargained with the lack of close relationships over time by always being the expert or consultant in the room.  Therapeutic modalities: Solution-Oriented/Positive Psychology, Humanistic/Existential and Interpersonal  Mental Status/Observations:  Appearance:   Casual and Neat     Behavior:  Appropriate  Motor:  Normal  Speech/Language:   Clear and Coherent  Affect:  Appropriate  Mood:  anxious  Thought process:  normal  Thought content:    WNL and worry  Sensory/Perceptual disturbances:    WNL  Orientation:  Fully oriented  Attention:  Good    Concentration:  Good  Memory:  WNL  Insight:    Good  Judgment:   Good  Impulse Control:  Good   Risk Assessment: Danger to Self: No Self-injurious Behavior: No Danger to Others: No Physical Aggression / Violence: No Duty to Warn: No Access to Firearms a concern: No  Assessment of progress:  progressing  Diagnosis:   ICD-10-CM   1. Dysthymic disorder  F34.1   2. Generalized anxiety disorder  F41.1    Plan:  . Homework to start identifying possible venues to meet likeminded people and find personally stimulating conversation and interaction -- cast a wide net, do not edit out any possibilities prematurely . Mentally practice daring to introduce herself and inquire about offerings that may turn out to be of interest . Other recommendations/advice  as may be noted above . Continue to utilize previously learned skills ad lib . Maintain medication as prescribed and work faithfully with relevant prescriber(s) if any changes are desired or seem indicated . Call the clinic on-call service, present to ER, or call 911 if any life-threatening psychiatric crisis Return in about 2 weeks (around 05/29/2020) for time as  available. . Already scheduled visit in this office 05/30/2020.  Blanchie Serve, PhD Luan Moore, PhD LP Clinical Psychologist, [facility] [address], [place], [place] [address] [phone]

## 2020-05-24 ENCOUNTER — Ambulatory Visit: Payer: BC Managed Care – PPO | Admitting: Hematology

## 2020-05-24 ENCOUNTER — Other Ambulatory Visit: Payer: BC Managed Care – PPO

## 2020-05-28 ENCOUNTER — Other Ambulatory Visit: Payer: Self-pay | Admitting: *Deleted

## 2020-05-28 DIAGNOSIS — D508 Other iron deficiency anemias: Secondary | ICD-10-CM

## 2020-05-28 DIAGNOSIS — D510 Vitamin B12 deficiency anemia due to intrinsic factor deficiency: Secondary | ICD-10-CM

## 2020-05-29 ENCOUNTER — Inpatient Hospital Stay: Payer: Medicare Other | Attending: Hematology

## 2020-05-29 ENCOUNTER — Telehealth: Payer: Self-pay | Admitting: Hematology

## 2020-05-29 ENCOUNTER — Other Ambulatory Visit: Payer: Self-pay

## 2020-05-29 ENCOUNTER — Inpatient Hospital Stay (HOSPITAL_BASED_OUTPATIENT_CLINIC_OR_DEPARTMENT_OTHER): Payer: Medicare Other | Admitting: Hematology

## 2020-05-29 VITALS — BP 96/42 | HR 63 | Temp 96.6°F | Resp 18 | Ht 69.25 in | Wt 127.1 lb

## 2020-05-29 DIAGNOSIS — Z800 Family history of malignant neoplasm of digestive organs: Secondary | ICD-10-CM | POA: Insufficient documentation

## 2020-05-29 DIAGNOSIS — I809 Phlebitis and thrombophlebitis of unspecified site: Secondary | ICD-10-CM | POA: Diagnosis present

## 2020-05-29 DIAGNOSIS — D510 Vitamin B12 deficiency anemia due to intrinsic factor deficiency: Secondary | ICD-10-CM

## 2020-05-29 DIAGNOSIS — D72819 Decreased white blood cell count, unspecified: Secondary | ICD-10-CM | POA: Diagnosis not present

## 2020-05-29 DIAGNOSIS — Z803 Family history of malignant neoplasm of breast: Secondary | ICD-10-CM | POA: Diagnosis not present

## 2020-05-29 DIAGNOSIS — Z801 Family history of malignant neoplasm of trachea, bronchus and lung: Secondary | ICD-10-CM | POA: Insufficient documentation

## 2020-05-29 DIAGNOSIS — D508 Other iron deficiency anemias: Secondary | ICD-10-CM

## 2020-05-29 DIAGNOSIS — D709 Neutropenia, unspecified: Secondary | ICD-10-CM | POA: Diagnosis present

## 2020-05-29 LAB — CBC WITH DIFFERENTIAL (CANCER CENTER ONLY)
Abs Immature Granulocytes: 0.01 10*3/uL (ref 0.00–0.07)
Basophils Absolute: 0 10*3/uL (ref 0.0–0.1)
Basophils Relative: 1 %
Eosinophils Absolute: 0 10*3/uL (ref 0.0–0.5)
Eosinophils Relative: 0 %
HCT: 40.5 % (ref 36.0–46.0)
Hemoglobin: 13.5 g/dL (ref 12.0–15.0)
Immature Granulocytes: 0 %
Lymphocytes Relative: 30 %
Lymphs Abs: 0.9 10*3/uL (ref 0.7–4.0)
MCH: 30.5 pg (ref 26.0–34.0)
MCHC: 33.3 g/dL (ref 30.0–36.0)
MCV: 91.4 fL (ref 80.0–100.0)
Monocytes Absolute: 0.2 10*3/uL (ref 0.1–1.0)
Monocytes Relative: 8 %
Neutro Abs: 1.8 10*3/uL (ref 1.7–7.7)
Neutrophils Relative %: 61 %
Platelet Count: 202 10*3/uL (ref 150–400)
RBC: 4.43 MIL/uL (ref 3.87–5.11)
RDW: 11.9 % (ref 11.5–15.5)
WBC Count: 2.9 10*3/uL — ABNORMAL LOW (ref 4.0–10.5)
nRBC: 0 % (ref 0.0–0.2)

## 2020-05-29 LAB — CMP (CANCER CENTER ONLY)
ALT: 21 U/L (ref 0–44)
AST: 32 U/L (ref 15–41)
Albumin: 4 g/dL (ref 3.5–5.0)
Alkaline Phosphatase: 82 U/L (ref 38–126)
Anion gap: 5 (ref 5–15)
BUN: 12 mg/dL (ref 8–23)
CO2: 29 mmol/L (ref 22–32)
Calcium: 9.6 mg/dL (ref 8.9–10.3)
Chloride: 106 mmol/L (ref 98–111)
Creatinine: 0.73 mg/dL (ref 0.44–1.00)
GFR, Estimated: >60 mL/min (ref >60)
Glucose, Bld: 88 mg/dL (ref 70–99)
Potassium: 4.3 mmol/L (ref 3.5–5.1)
Sodium: 140 mmol/L (ref 135–145)
Total Bilirubin: 0.7 mg/dL (ref 0.3–1.2)
Total Protein: 6.5 g/dL (ref 6.5–8.1)

## 2020-05-29 LAB — VITAMIN B12: Vitamin B-12: 1342 pg/mL — ABNORMAL HIGH (ref 180–914)

## 2020-05-29 LAB — IRON AND TIBC
Iron: 122 ug/dL (ref 41–142)
Saturation Ratios: 40 % (ref 21–57)
TIBC: 301 ug/dL (ref 236–444)
UIBC: 180 ug/dL (ref 120–384)

## 2020-05-29 LAB — FERRITIN: Ferritin: 157 ng/mL (ref 11–307)

## 2020-05-29 NOTE — Telephone Encounter (Signed)
Scheduled appointments per 10/5 los. Spoke to patient who is aware of appointments dates and times.

## 2020-05-29 NOTE — Progress Notes (Signed)
HEMATOLOGY/ONCOLOGY CONSULTATION NOTE  Date of Service: 05/29/2020  Patient Care Team: Lavone Orn, MD as PCP - General (Internal Medicine) Stanford Breed Denice Bors, MD as PCP - Cardiology (Cardiology) Annia Belt, MD as Consulting Physician (Oncology) Alda Berthold, DO as Consulting Physician (Neurology) Arnetha Gula, MD as Consulting Physician (Family Medicine) Gerda Diss, DO as Consulting Physician (Sports Medicine) Brunetta Genera, MD as Consulting Physician (Hematology)  CHIEF COMPLAINTS/PURPOSE OF CONSULTATION:  Chronic Idiopathic Neutropenia  HISTORY OF PRESENTING ILLNESS:   Megan [name] is a wonderful 64 y.o. female who has been referred to [place] by Dr. Murriel Hopper for evaluation and management of Chronic idiopathic neutropenia. The pt reports that she is doing well overall.   The pt reports a history of mold exposure, chronic mild neutropenia, and neuropathy. She denies frequent or abnormal infections.   The pt notes that she was first diagnosed with neuropathy in 2010, and has seen five neurologists. She endorses weakness and tingling in feet into mid-calf which has improved over the past 10 years. She has current weakness and concern for balance which limits her activity. She adds that the bottom of her feet occasionally become dark purple. She has had doppler studies and notes these ruled out vascular problems. She has seen holistic health practitioners in [place] in the past. She had a four out of five positives on western blot which revealed concerns for Lyme disease but apparently was not conclusive. She previously lived in [place]. She denies ever having a bulls eye rash.  The pt notes some difficulty with writing, which has not been progressive.   The pt has recently been worked up for elevated estrogen levels and a previous cyst, and was previously taking Biotin. She stopped taking Boitin and has seen normalized estrogen levels.  She notes that repeat MRI revealed the absence of the previously seen cyst  The pt also reports a history of polyclonal gammopathy.   She notes that her Ferritin was seen to be at 26 about 1.5 years ago. She has been a vegan and then began eating some meat protein. She also took PO iron replacement which was constipating. She also has taken Blood builder. Her Ferritin has recently decreased to a 10. Pt denies blood in the stools nor black stools. She had a colonoscopy about 1.5 years ago and notes this was unremarkable. She denies unexpected weight loss. She denies using acid suppressants. She endorses having pernicious anemia. Pt takes 5043mcg Vitamin B12 daily. She is also taking 10k units Vitamin D3 five days a week.   The pt notes that she has osteoporosis, with L1-L4 with score of -3.5. She has been disinclined to pursue Prolia injections. She has been consuming almond milk and avoids dairy products. She took Fosamax three years ago.  Most recent lab results (08/22/18) of CBC is as follows: all values are WNL except for WBC at 3.5k.  On review of systems, pt reports neuropathy with weakness in feet, stable energy levels, constipation, and denies blood in the stools, black stools, abdominal pains, and any other symptoms.   On PMHx the pt reports neuropathy, pernicious anemia, osteoporosis.  INTERVAL HISTORY:   Megan [name] is a wonderful 64 y.o. female who is here for evaluation and management of Chronic idiopathic neutropenia. The patient's last visit with [place] was on 08/18/2019. The pt reports that she is doing well overall.  The pt reports continued fatigue. We discussed the various possible etiologies of this  and role of the several supplements she is taking.  Lab results today (05/29/20) of CBC w/diff and CMP is as follows:  WBC 2.9k with ANC 1.8k. 05/29/2020 Ferritin at 157 05/29/2020 Vitamin B12 at 1342  On review of systems, pt reports no fevers, no chills, on night sweats and  no other new focal symptoms and any other symptoms.   MEDICAL HISTORY:  Past Medical History:  Diagnosis Date  . Allergic rhinitis   . Allergy   . Aneurysm of cardiac wall, congenital    a.  septal aneurysm extending into the RVOT (by cardiac MRI in 08/2013)  . Anxiety   . Aortic atherosclerosis ([place])   . Arthritis   . Asthma   . Basal cell carcinoma   . Depression   . Heart murmur   . Hypothyroidism   . Leukopenia    idiopathic  . Lyme disease   . Mitral valve prolapse   . MVP (mitral valve prolapse)   . Orthostatic hypotension   . Osteoporosis   . Pancreatic cyst   . Peripheral neuropathy 06/09/2012  . Pernicious anemia   . Pernicious anemia   . Pernicious anemia   . Plantar fasciitis   . Pneumothorax     SURGICAL HISTORY: Past Surgical History:  Procedure Laterality Date  . COLONOSCOPY WITH PROPOFOL N/A 02/22/2013   Procedure: COLONOSCOPY WITH PROPOFOL;  Surgeon: Garlan Fair, MD;  Location: [facility];  Service: Endoscopy;  Laterality: N/A;  . MOUTH SURGERY  01/2012   bone graft in mouth  . NASAL SINUS SURGERY Right 06/12/2014   Procedure: RIGHT ENDOSCOPIC MAXILLARY ANTROSTOMY;  Surgeon: Ascencion Dike, MD;  Location: [facility];  Service: ENT;  Laterality: Right;  . ROOT CANAL  02/27/2012  . TONSILLECTOMY  1962    SOCIAL HISTORY: Social History   Socioeconomic History  . Marital status: Divorced    Spouse name: Not on file  . Number of children: 0  . Years of education: Not on file  . Highest education level: Not on file  Occupational History    Comment: Career and life coach  Tobacco Use  . Smoking status: Never Smoker  . Smokeless tobacco: Never Used  Vaping Use  . Vaping Use: Never used  Substance and Sexual Activity  . Alcohol use: No    Alcohol/week: 0.0 standard drinks  . Drug use: No  . Sexual activity: Not on file  Other Topics Concern  . Not on file  Social History Narrative   Patient is single and lives alone.    Patient is self-employed, career and life coaching.   Patient drinks two to four cups of caffeine daily.   Social Determinants of Health   Financial Resource Strain:   . Difficulty of Paying Living Expenses: Not on file  Food Insecurity:   . Worried About Charity fundraiser in the Last Year: Not on file  . Ran Out of Food in the Last Year: Not on file  Transportation Needs:   . Lack of Transportation (Medical): Not on file  . Lack of Transportation (Non-Medical): Not on file  Physical Activity:   . Days of Exercise per Week: Not on file  . Minutes of Exercise per Session: Not on file  Stress:   . Feeling of Stress : Not on file  Social Connections:   . Frequency of Communication with Friends and Family: Not on file  . Frequency of Social Gatherings with Friends and Family: Not on file  .  Attends Religious Services: Not on file  . Active Member of Clubs or Organizations: Not on file  . Attends Archivist Meetings: Not on file  . Marital Status: Not on file  Intimate Partner Violence:   . Fear of Current or Ex-Partner: Not on file  . Emotionally Abused: Not on file  . Physically Abused: Not on file  . Sexually Abused: Not on file    FAMILY HISTORY: Family History  Problem Relation Age of Onset  . Hyperlipidemia Father   . Heart failure Father   . Prostate cancer Father   . Kidney failure Father   . Hypertension Father   . Angina Mother   . Hypertension Mother   . Lung cancer Mother   . Colon cancer Mother   . Lung cancer Paternal Grandfather   . Breast cancer Paternal Grandmother   . Diabetes Paternal Grandmother   . Esophageal cancer Neg Hx   . Liver cancer Neg Hx   . Pancreatic cancer Neg Hx   . Rectal cancer Neg Hx   . Stomach cancer Neg Hx     ALLERGIES:  is allergic to augmentin [amoxicillin-pot clavulanate] and clindamycin/lincomycin.  MEDICATIONS:  Current Outpatient Medications  Medication Sig Dispense Refill  . 5-Hydroxytryptophan (5-HTP)  100 MG CAPS Take 100 mg by mouth daily.     [name] 5-Methyltetrahydrofolate (METHYL FOLATE) POWD Take 680 mcg/L by mouth daily.    . Alpha-Lipoic Acid 600 MG TABS Take 600 mg by mouth daily.    . AMINO ACIDS COMPLEX PO Take 1 Scoop by mouth daily.     Francia Greaves THYROID 30 MG tablet Take 30 mg by mouth daily.  6  . Ascorbic Acid (VITAMIN C PO) Take 2,500 Units by mouth daily.     . Cholecalciferol (VITAMIN D3) 10000 units capsule Take 10,000 Units by mouth daily.     . Cyanocobalamin (VITAMIN B-12 ER PO) Take 5,000 mcg by mouth daily.     [name] GLUTATHIONE PO Take 250 mg by mouth daily.    . Magnesium Ascorbate POWD Take 600 mg by mouth daily.    [name] MAGNESIUM CITRATE PO Take 800 mg by mouth daily.     . Menaquinone-7 (VITAMIN K2 PO) Take 1 tablet by mouth daily.    . NONFORMULARY OR COMPOUNDED ITEM 2 drops in the morning and at bedtime. Estradiol-Progesterone 0.06-5-0.33 MG liquid drops    . NONFORMULARY OR COMPOUNDED ITEM Place 1 mg vaginally. Estriol VB 1MG  0.1% cream     No current facility-administered medications for this visit.    REVIEW OF SYSTEMS:   A 10+ POINT REVIEW OF SYSTEMS WAS OBTAINED including neurology, dermatology, psychiatry, cardiac, respiratory, lymph, extremities, GI, GU, Musculoskeletal, constitutional, breasts, reproductive, HEENT.  All pertinent positives are noted in the HPI.  All others are negative.   PHYSICAL EXAMINATION:  . There were no vitals filed for this visit. There were no vitals filed for this visit. .There is no height or weight on file to calculate BMI.  NAD GENERAL:alert, in no acute distress and comfortable SKIN: no acute rashes, no significant lesions EYES: conjunctiva are pink and non-injected, sclera anicteric OROPHARYNX: MMM, no exudates, no oropharyngeal erythema or ulceration NECK: supple, no JVD LYMPH:  no palpable lymphadenopathy in the cervical, axillary or inguinal regions LUNGS: clear to auscultation b/l with normal respiratory  effort HEART: regular rate & rhythm ABDOMEN:  normoactive bowel sounds , non tender, not distended. No palpable hepatosplenomegaly.  Extremity: no pedal edema PSYCH: alert &  oriented x 3 with fluent speech NEURO: no focal motor/sensory deficits  LABORATORY DATA:  I have reviewed the data as listed  . CBC Latest Ref Rng & Units 05/29/2020 11/21/2019 07/20/2019  WBC 4.0 - 10.5 K/uL 2.9(L) 3.2(L) 3.4(L)  Hemoglobin 12.0 - 15.0 g/dL 13.5 13.9 14.3  Hematocrit 36 - 46 % 40.5 41.7 43.3  Platelets 150 - 400 K/uL 202 187 216    . CMP Latest Ref Rng & Units 12/20/2019 07/20/2019 07/16/2019  Glucose 70 - 99 mg/dL 79 87 79  BUN 6 - 23 mg/dL 15 13 11   Creatinine 0.40 - 1.20 mg/dL 0.66 0.64 0.68  Sodium 135 - 145 mEq/L 140 140 140  Potassium 3.5 - 5.1 mEq/L 4.1 3.8 3.6  Chloride 96 - 112 mEq/L 105 106 105  CO2 19 - 32 mEq/L 31 26 27   Calcium 8.4 - 10.5 mg/dL 9.3 9.5 9.5  Total Protein 6.0 - 8.3 g/dL 6.3 - -  Total Bilirubin 0.2 - 1.2 mg/dL 0.6 - -  Alkaline Phos 39 - 117 U/L 79 - -  AST 0 - 37 U/L 24 - -  ALT 0 - 35 U/L 18 - -   . Lab Results  Component Value Date   IRON 122 05/29/2020   TIBC 301 05/29/2020   IRONPCTSAT 40 05/29/2020   (Iron and TIBC)  Lab Results  Component Value Date   FERRITIN 157 05/29/2020     RADIOGRAPHIC STUDIES: I have personally reviewed the radiological images as listed and agreed with the findings in the report. No results found.  ASSESSMENT & PLAN:  64 y.o. female with  1. Chronic idiopathic leucopenia. WBC count today low normal @ 3.5k ANC 1900 ( no neutropenia) 2. Pernicious anemia -12/30/17 Antiparietal cell antibodies which were high at 105.9  PLAN: -Pt's leukopenia has been mild over the last several years and non-progressive. ANC wnl -Continue SL Vitamin B12 - levels adequate. -Continue empiric Vitamin B complex -ferritin adequate -- no indications for IV Iron at this time.   FOLLOW UP: RTC with Dr Irene Limbo with labs in 12  months   The total time spent in the appt was 20 minutes and more than 50% was on counseling and direct patient cares.  All of the patient's questions were answered with apparent satisfaction. The patient knows to call the clinic with any problems, questions or concerns.    Sullivan Lone MD [place] AAHIVMS [facility] Hematology/Oncology Physician [facility]  (Office):       [phone] (Work cell):  [phone] (Fax):           [phone]  05/29/2020 8:26 AM  I, Yevette Edwards, am acting as a scribe for Dr. Sullivan Lone.   .I have reviewed the above documentation for accuracy and completeness, and I agree with the above. Brunetta Genera MD

## 2020-05-30 ENCOUNTER — Ambulatory Visit: Payer: Medicare Other | Admitting: Psychiatry

## 2020-06-01 ENCOUNTER — Ambulatory Visit: Payer: Medicare Other | Admitting: Neurology

## 2020-06-04 ENCOUNTER — Ambulatory Visit: Payer: PRIVATE HEALTH INSURANCE | Admitting: Podiatry

## 2020-06-04 ENCOUNTER — Ambulatory Visit
Admission: RE | Admit: 2020-06-04 | Discharge: 2020-06-04 | Disposition: A | Discharge status: Home / Self Care | Payer: Medicare Other | Source: Ambulatory Visit | Attending: Otolaryngology | Admitting: Otolaryngology

## 2020-06-04 ENCOUNTER — Telehealth: Payer: Self-pay | Admitting: Hematology

## 2020-06-04 ENCOUNTER — Encounter: Payer: Self-pay | Admitting: Hematology

## 2020-06-04 DIAGNOSIS — J329 Chronic sinusitis, unspecified: Secondary | ICD-10-CM

## 2020-06-04 NOTE — Telephone Encounter (Signed)
Scheduled per sch msg. Called and left msg  

## 2020-06-05 ENCOUNTER — Inpatient Hospital Stay (HOSPITAL_BASED_OUTPATIENT_CLINIC_OR_DEPARTMENT_OTHER): Payer: Medicare Other | Admitting: Nurse Practitioner

## 2020-06-05 ENCOUNTER — Other Ambulatory Visit: Payer: Self-pay

## 2020-06-05 VITALS — BP 115/67 | HR 74 | Temp 97.7°F | Resp 16 | Ht 69.25 in | Wt 125.8 lb

## 2020-06-05 DIAGNOSIS — I809 Phlebitis and thrombophlebitis of unspecified site: Secondary | ICD-10-CM | POA: Diagnosis not present

## 2020-06-05 DIAGNOSIS — D510 Vitamin B12 deficiency anemia due to intrinsic factor deficiency: Secondary | ICD-10-CM

## 2020-06-05 DIAGNOSIS — D72819 Decreased white blood cell count, unspecified: Secondary | ICD-10-CM

## 2020-06-05 NOTE — Progress Notes (Signed)
[facility]pt[facility] Management Clinic Progress Note   JEZELLE GULLICK [id number] 24-Sep-1955 64 y.o.  SHARAYAH RENFROW is managed by Dr. Irene Limbo   Actively treated with chemotherapy/immunotherapy/hormonal therapy: no  Current therapy: None - seen for mild leukopenia and pernicious anemia managed with sublingual B12  Last treated: not applicable   Next scheduled appointment with provider:05/29/2021  Assessment: Plan:    Phlebitis  Leukopenia, unspecified type  Pernicious anemia   Plan - Phlebitis - phlebitis should resolve spontaneously in next 2 weeks; encouraged patient to apply warm compresses for 20 min intervals and utilize OTC meds such as Tylenol or Ibuprofen for any additional pain issues. Encouraged to follow up with ortho for her foot/ leg pain as planned. We will see her back in October 2022 for her anemia/ leukopenia.  Please see After Visit Summary for patient specific instructions.  Future Appointments  Date Time Provider [place]  06/12/2020  3:00 PM Blanchie Serve, PhD [facility] None  06/13/2020  9:15 AM Wallene Huh, DPM [facility]  06/26/2020  4:00 PM Blanchie Serve, PhD [facility] None  07/09/2020  1:20 PM Lelon Perla, MD [facility]  07/11/2020  3:00 PM Blanchie Serve, PhD [facility] None  08/27/2020 12:50 PM Narda Amber K, DO [facility] None  03/06/2021  8:00 AM Philemon Kingdom, MD [facility] None  05/29/2021  8:30 AM [facility] None  05/29/2021  9:00 AM Brunetta Genera, MD [facility] None    No orders of the defined types were placed in this encounter.      Subjective:   Patient ID:  YUE FLANIGAN is a 64 y.o. (DOB 21-Feb-1956) female.  Chief Complaint: No chief complaint on file.   HPI SHEYLA ZAFFINO presents today with complaint that she has bruising, and electric like shocks after having blood drawn last week. Upon examination she does have what appears to be a healing bruise in the left arm in the  antecubital area, but no redness, swelling, warmth or drainage noted. She reports her left hand will occasionally go numb, "Like its falling asleep" and then the numbness sensation will be followed by sharp "electric like shocks" up and down her arm.   She is worried about permanent nerve damage and because she recently got the [employer] vaccine, she is also very concerned she might have "lots of little blood clots." She has no shortness of breath, no cough, no hemoptysis. She has an orthopedic boot on her right foot and stated she was told by ortho they were "worried about vascular necrosis in the foot." She has had some pain in right calf, but no swelling, no warmth, no localized or focal are of the pain.  Lengthy discussion with her regarding blood clots, blood draws, and management of the bruise. She states she feels like she "will now have PTSD about blood draws" - suggested she request butterfly needle for future lab draws, and reassured her that permanent nerve damage from a peripheral blood draw, while possible, is very very rare and unlikely.   Urged her to use warm compresses for 20 min when she has the electric type pain,  and OTC Tylenol or Ibuprofen for pain. Discussed with Dr. Benay Spice the unlikelihood that the [employer] vaccine has caused any kind of thromboembolic process in the complete absence of clinical symptoms. The area in her arm does not have swelling, or redness, or any indication of a clot.   Medications: I have reviewed the patient's current medications.  Allergies:  Allergies  Allergen Reactions   Augmentin [Amoxicillin-Pot Clavulanate]    Clindamycin/Lincomycin     Past Medical History:  Diagnosis Date   Allergic rhinitis    Allergy    Aneurysm of cardiac wall, congenital    a.  septal aneurysm extending into the RVOT (by cardiac MRI in 08/2013)   Anxiety    Aortic atherosclerosis ([facility])    Arthritis    Asthma    Basal cell carcinoma     Depression    Heart murmur    Hypothyroidism    Leukopenia    idiopathic   Lyme disease    Mitral valve prolapse    MVP (mitral valve prolapse)    Orthostatic hypotension    Osteoporosis    Pancreatic cyst    Peripheral neuropathy 06/09/2012   Pernicious anemia    Pernicious anemia    Pernicious anemia    Plantar fasciitis    Pneumothorax     Past Surgical History:  Procedure Laterality Date   COLONOSCOPY WITH PROPOFOL N/A 02/22/2013   Procedure: COLONOSCOPY WITH PROPOFOL;  Surgeon: Garlan Fair, MD;  Location: [facility];  Service: Endoscopy;  Laterality: N/A;   MOUTH SURGERY  01/2012   bone graft in mouth   NASAL SINUS SURGERY Right 06/12/2014   Procedure: RIGHT ENDOSCOPIC MAXILLARY ANTROSTOMY;  Surgeon: Ascencion Dike, MD;  Location: [facility];  Service: ENT;  Laterality: Right;   ROOT CANAL  02/27/2012   TONSILLECTOMY  1962    Family History  Problem Relation Age of Onset   Hyperlipidemia Father    Heart failure Father    Prostate cancer Father    Kidney failure Father    Hypertension Father    Angina Mother    Hypertension Mother    Lung cancer Mother    Colon cancer Mother    Lung cancer Paternal Grandfather    Breast cancer Paternal Grandmother    Diabetes Paternal Grandmother    Esophageal cancer Neg Hx    Liver cancer Neg Hx    Pancreatic cancer Neg Hx    Rectal cancer Neg Hx    Stomach cancer Neg Hx     Social History   Socioeconomic History   Marital status: Divorced    Spouse name: Not on file   Number of children: 0   Years of education: Not on file   Highest education level: Not on file  Occupational History    Comment: Designer, jewellery and life coach  Tobacco Use   Smoking status: Never Smoker   Smokeless tobacco: Never Used  Scientific laboratory technician Use: Never used  Substance and Sexual Activity   Alcohol use: No    Alcohol/week: 0.0 standard drinks   Drug use: No   Sexual activity:  Not on file  Other Topics Concern   Not on file  Social History Narrative   Patient is single and lives alone.   Patient is self-employed, career and life coaching.   Patient drinks two to four cups of caffeine daily.   Social Determinants of Health   Financial Resource Strain:    Difficulty of Paying Living Expenses: Not on file  Food Insecurity:    Worried About Charity fundraiser in the Last Year: Not on file   [employer] of Food in the Last Year: Not on file  Transportation Needs:    Lack of Transportation (Medical): Not on file   Lack of Transportation (Non-Medical): Not on file  Physical Activity:    Days of Exercise per Week: Not on file   Minutes of Exercise per Session: Not on file  Stress:    Feeling of Stress : Not on file  Social Connections:    Frequency of Communication with Friends and Family: Not on file   Frequency of Social Gatherings with Friends and Family: Not on file   Attends Religious Services: Not on file   Active Member of Clubs or Organizations: Not on file   Attends Archivist Meetings: Not on file   Marital Status: Not on file  Intimate Partner Violence:    Fear of Current or Ex-Partner: Not on file   Emotionally Abused: Not on file   Physically Abused: Not on file   Sexually Abused: Not on file    Past Medical History, Surgical history, Social history, and Family history were reviewed and updated as appropriate.   Please see review of systems for further details on the patient's review from today.   Review of Systems:  Review of Systems  Respiratory: Negative.   Skin: bruising/ tenderness, "electric type shocks" to left antecubital fossa, left forearm; intermittent numbness in left hand Pain in left foot (which has orthopedic boot on)  Objective:   Physical Exam:  BP 115/67 (BP Location: Left Arm, Patient Position: Sitting)    Pulse 74    Temp 97.7 F (36.5 C) (Tympanic)    Resp 16    Ht 5' 9.25" (1.759 m)     Wt 125 lb 12.8 oz (57.1 kg)    SpO2 100%    BMI 18.44 kg/m  ECOG: 0  Physical Exam Constitutional:      Appearance: Normal appearance.  HENT:     Head: Normocephalic and atraumatic.  Musculoskeletal:     Cervical back: Neck supple.  Skin:    General: Skin is warm and dry.     Findings: Bruising present.     Comments: Slight yellowish /purple healing bruise noted to left antecubuital fossa  Neurological:     General: No focal deficit present.     Mental Status: She is alert and oriented to person, place, and time.     Lab Review:     Component Value Date/Time   NA 140 05/29/2020 0905   NA 143 01/19/2018 0941   NA 140 05/24/2012 1053   K 4.3 05/29/2020 0905   K 4.1 05/24/2012 1053   CL 106 05/29/2020 0905   CL 106 05/24/2012 1053   CO2 29 05/29/2020 0905   CO2 27 05/24/2012 1053   GLUCOSE 88 05/29/2020 0905   GLUCOSE 72 05/24/2012 1053   BUN 12 05/29/2020 0905   BUN 11 01/19/2018 0941   BUN 13.0 05/24/2012 1053   CREATININE 0.73 05/29/2020 0905   CREATININE 0.60 11/04/2017 0918   CREATININE 0.7 05/24/2012 1053   CALCIUM 9.6 05/29/2020 0905   CALCIUM 9.5 05/24/2012 1053   PROT 6.5 05/29/2020 0905   PROT CANCELED 01/19/2018 0941   PROT 6.6 05/24/2012 1053   ALBUMIN 4.0 05/29/2020 0905   ALBUMIN 4.0 05/24/2012 1053   AST 32 05/29/2020 0905   AST 29 05/24/2012 1053   ALT 21 05/29/2020 0905   ALT 13 05/24/2012 1053   ALKPHOS 82 05/29/2020 0905   ALKPHOS 65 05/24/2012 1053   BILITOT 0.7 05/29/2020 0905   BILITOT 0.50 05/24/2012 1053   GFRNONAA >60 05/29/2020 0905   GFRNONAA 98 11/04/2017 0918   GFRAA >60 07/20/2019 0208   GFRAA >60 03/03/2019 1432  GFRAA 113 11/04/2017 0918       Component Value Date/Time   WBC 2.9 (L) 05/29/2020 0905   WBC 3.2 (L) 11/21/2019 1354   RBC 4.43 05/29/2020 0905   HGB 13.5 05/29/2020 0905   HGB 13.7 03/15/2013 1144   HCT 40.5 05/29/2020 0905   HCT 40.8 03/15/2013 1144   PLT 202 05/29/2020 0905   PLT 210 03/15/2013 1144    MCV 91.4 05/29/2020 0905   MCV 88.9 03/15/2013 1144   MCH 30.5 05/29/2020 0905   MCHC 33.3 05/29/2020 0905   RDW 11.9 05/29/2020 0905   RDW 13.8 03/15/2013 1144   LYMPHSABS 0.9 05/29/2020 0905   LYMPHSABS 1.0 03/15/2013 1144   MONOABS 0.2 05/29/2020 0905   MONOABS 0.3 03/15/2013 1144   EOSABS 0.0 05/29/2020 0905   EOSABS 0.0 03/15/2013 1144   BASOSABS 0.0 05/29/2020 0905   BASOSABS 0.1 03/15/2013 1144   -------------------------------  Imaging from last 24 hours (if applicable):  Radiology interpretation: CT MAXILLOFACIAL WO CONTRAST  Result Date: 06/04/2020 CLINICAL DATA:  Chronic sinusitis with right facial pain and headaches. EXAM: CT MAXILLOFACIAL WITHOUT CONTRAST TECHNIQUE: Multidetector CT imaging of the maxillofacial structures was performed. Multiplanar CT image reconstructions were also generated. COMPARISON:  None. FINDINGS: Osseous: No fracture or mandibular dislocation. No destructive process. Orbits: Negative. No traumatic or inflammatory finding. Sinuses: Clear. Postsurgical changes of right maxillary antrostomy and uncinectomy. Soft tissues: Negative. Limited intracranial: No significant or unexpected finding. IMPRESSION: Postsurgical changes of right maxillary antrostomy and uncinectomy. Clear sinuses. Electronically Signed   By: Ulyses Jarred M.D.   On: 06/04/2020 22:01

## 2020-06-12 ENCOUNTER — Ambulatory Visit (INDEPENDENT_AMBULATORY_CARE_PROVIDER_SITE_OTHER): Payer: Medicare Other | Admitting: Psychiatry

## 2020-06-12 ENCOUNTER — Other Ambulatory Visit: Payer: Self-pay

## 2020-06-12 DIAGNOSIS — F341 Dysthymic disorder: Secondary | ICD-10-CM | POA: Diagnosis not present

## 2020-06-12 DIAGNOSIS — Z77120 Contact with and (suspected) exposure to mold (toxic): Secondary | ICD-10-CM

## 2020-06-12 DIAGNOSIS — R5382 Chronic fatigue, unspecified: Secondary | ICD-10-CM

## 2020-06-12 DIAGNOSIS — F411 Generalized anxiety disorder: Secondary | ICD-10-CM | POA: Diagnosis not present

## 2020-06-12 DIAGNOSIS — R6889 Other general symptoms and signs: Secondary | ICD-10-CM | POA: Diagnosis not present

## 2020-06-12 DIAGNOSIS — K089 Disorder of teeth and supporting structures, unspecified: Secondary | ICD-10-CM

## 2020-06-12 DIAGNOSIS — G8929 Other chronic pain: Secondary | ICD-10-CM

## 2020-06-12 NOTE — Progress Notes (Signed)
Psychotherapy Progress Note [facility] Luan Moore, PhD LP  Patient ID: AARYANA BETKE     MRN: [id number] Therapy format: Individual psychotherapy Date: 06/12/2020      Start: 3:14p     Stop: 4:02p     Time Spent: 48 min Location: In-person   Session narrative (presenting needs, interim history, self-report of stressors and symptoms, applications of prior therapy, status changes, and interventions made in session) Dealing with (questioned) MS and mystery pain in face, lots of followup opinions after specialty dentist work.  Dentist in [place] not available, endocrinologist found a major infection, referred her to a periodontist, who said she needed immediate surgery.  Called her regular dentist, who admitted being out with [place].  Surgery done, helpful.  Foot problem now, orthopedist says she has avascular necrosis.  MRI in 3 wks.  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  Jealous of wealthy friends, made a self-identified mistake Googling her ex-husband's property.  Plans made to travel to [place], as hoped, but will be traveling with an anti-masker.  Reviewed outlook for handling this, identified issue with her mortality.  Recognizes she may need permission to die.  Therapeutic modalities: Cognitive Behavioral Therapy and Solution-Oriented/Positive Psychology  Mental Status/Observations:  Appearance:   Casual     Behavior:  Appropriate  Motor:  Normal  Speech/Language:   Clear and Coherent  Affect:  Appropriate  Mood:  anxious  Thought process:  normal  Thought content:    WNL  Sensory/Perceptual disturbances:    WNL  Orientation:  Fully oriented  Attention:  Good    Concentration:  Good  Memory:  WNL  Insight:    Good  Judgment:   Good  Impulse Control:  Good   Risk Assessment: Danger to Self: No Self-injurious Behavior: No Danger to Others: No Physical Aggression / Violence: No Duty to Warn: No Access to Firearms  a concern: No  Assessment of progress:  stabilized  Diagnosis:   ICD-10-CM   1. Generalized anxiety disorder  F41.1   2. Early onset dysthymia  F34.1   3. Suspected Lyme disease  R68.89   4. Mold exposure  Z77.120   5. Chronic fatigue  R53.82   6. Chronic dental pain  K08.9    G89.29    Plan:  . Consider and practice handling of unwanted moments with friend . Follow through appropriate medical care . Work through Regulatory affairs officer and plan to remain solvent . Other recommendations/advice as may be noted above . Continue to utilize previously learned skills ad lib . Maintain medication as prescribed and work faithfully with relevant prescriber(s) if any changes are desired or seem indicated . Call the clinic on-call service, present to ER, or call 911 if any life-threatening psychiatric crisis Return for time as available. . Already scheduled visit in this office 06/26/2020.  Blanchie Serve, PhD Luan Moore, PhD LP Clinical Psychologist, [facility] [address] [place], [place] [address] [phone]

## 2020-06-13 ENCOUNTER — Encounter: Payer: Self-pay | Admitting: Podiatry

## 2020-06-13 ENCOUNTER — Ambulatory Visit: Payer: Medicare Other

## 2020-06-13 ENCOUNTER — Ambulatory Visit (INDEPENDENT_AMBULATORY_CARE_PROVIDER_SITE_OTHER): Payer: Medicare Other | Admitting: Podiatry

## 2020-06-13 DIAGNOSIS — M778 Other enthesopathies, not elsewhere classified: Secondary | ICD-10-CM | POA: Diagnosis not present

## 2020-06-13 DIAGNOSIS — M779 Enthesopathy, unspecified: Secondary | ICD-10-CM

## 2020-06-13 DIAGNOSIS — G629 Polyneuropathy, unspecified: Secondary | ICD-10-CM | POA: Diagnosis not present

## 2020-06-13 NOTE — Progress Notes (Signed)
Subjective:   Patient ID: Megan [name], female   DOB: 64 y.o.   MRN: [id number]   HPI Patient presents stating that she saw an orthopedic who thought that she had avascular necrosis of her bone and is sending her for MRI and she still has pain in her forefoot and she was concerned   ROS      Objective:  Physical Exam  Neurovascular status intact with inflammation mostly centered around the fourth MPJ right with some crepitus of the second MPJ right     Assessment:  Inflammatory capsulitis fourth MPJ right with probability for a long-term small bone reaction within the head of the second metatarsal     Plan:  H&P went over conditions at great length and I do not think they are related.  Today for the right fourth MPJ good discussed steroid injection but she denies wanting this so I applied padding to take pressure off the metatarsal and we will wait for the results of the MRI and decide if anything else is appropriate.  Spent a great deal time going over the different treatment options and questions that she had concerning the second and fourth metatarsal and did look at her x-rays on her phone

## 2020-06-26 ENCOUNTER — Ambulatory Visit (INDEPENDENT_AMBULATORY_CARE_PROVIDER_SITE_OTHER): Payer: Medicare Other | Admitting: Psychiatry

## 2020-06-26 ENCOUNTER — Other Ambulatory Visit: Payer: Self-pay

## 2020-06-26 DIAGNOSIS — R5382 Chronic fatigue, unspecified: Secondary | ICD-10-CM

## 2020-06-26 DIAGNOSIS — F411 Generalized anxiety disorder: Secondary | ICD-10-CM

## 2020-06-26 DIAGNOSIS — F341 Dysthymic disorder: Secondary | ICD-10-CM | POA: Diagnosis not present

## 2020-06-26 DIAGNOSIS — Z604 Social exclusion and rejection: Secondary | ICD-10-CM

## 2020-06-26 DIAGNOSIS — R6889 Other general symptoms and signs: Secondary | ICD-10-CM

## 2020-06-26 NOTE — Progress Notes (Signed)
Psychotherapy Progress Note [facility] Luan Moore, PhD LP  Patient ID: Megan [name]     MRN: [id number] Therapy format: Individual psychotherapy Date: 06/26/2020      Start: 4:03p     Stop: 4:50p     Time Spent: 47 min Location: In-person   Session narrative (presenting needs, interim history, self-report of stressors and symptoms, applications of prior therapy, status changes, and interventions made in session) Continues anxious.  Reports vivid dreaming, one of having noone to buy for and feeling lonely.  Another of being in the foyer of her former home in [place] (lived there 6th-9th grade), 3 females in a picture, one with [name]'s clothes, M dying, and [name] the dreamer becoming one of the females in the picture frame, along with the experience of not wanting M to die.  Discussed experience of mother, realizes she went into service mode when M actually did die, numbing and taking care of business.  Also been tuck in anger but alone.  Challenge whether she got her nicer qualities from [place].  Stunned to discover she truly misses her mother, yesterday went to the burial site.    Had positive meeting with friend Collier [place], lovely to visit and laugh.  Bittersweet to see him busy and involved when her life has taken a turn toward multiple healthcare concerns and work drying up.    Relates about father, gone 17 years from prostate cancer.  He converted to Catholicism when [name] was confirmed but had no faith in an afterlife.  Recounts once seeing him cry, and how both parents were in hospital and Hospice, M called for him.  Support/empathy provided.  Affirmed and encouraged.  Therapeutic modalities: Ego-Supportive, Insight-Oriented, Humanistic/Existential and Faith-sensitive  Mental Status/Observations:  Appearance:   Casual     Behavior:  Appropriate  Motor:  Normal  Speech/Language:   Clear and Coherent  Affect:  Appropriate  Mood:  dysthymic  Thought process:  normal   Thought content:    WNL  Sensory/Perceptual disturbances:    WNL  Orientation:  Fully oriented  Attention:  Good    Concentration:  Good  Memory:  WNL  Insight:    Good  Judgment:   Good  Impulse Control:  Good   Risk Assessment: Danger to Self: No Self-injurious Behavior: No Danger to Others: No Physical Aggression / Violence: No Duty to Warn: No Access to Firearms a concern: No  Assessment of progress:  progressing  Diagnosis:   ICD-10-CM   1. Early onset dysthymia  F34.1   2. Generalized anxiety disorder  F41.1   3. Social isolation  Z60.4   4. Chronic fatigue  R53.82   5. Suspected Lyme disease  R68.89    Plan:  . Journal ad lib . Continue working out Engineer, mining and health care concerns . As able, seek further social connections beyond virtual group and distant travel . Other recommendations/advice as may be noted above . Continue to utilize previously learned skills ad lib . Maintain medication as prescribed and work faithfully with relevant prescriber(s) if any changes are desired or seem indicated . Call the clinic on-call service, present to ER, or call 911 if any life-threatening psychiatric crisis Return for time as available. . Already scheduled visit in this office Visit date not found.  Blanchie Serve, PhD Luan Moore, PhD LP Clinical Psychologist, [facility] [address], [place], [place] [address] [phone]

## 2020-06-29 LAB — HEPATIC FUNCTION PANEL
ALT: 26 IU/L (ref 0–32)
AST: 34 IU/L (ref 0–40)
Albumin: 4.9 g/dL — ABNORMAL HIGH (ref 3.8–4.8)
Alkaline Phosphatase: 96 IU/L (ref 44–121)
Bilirubin Total: 0.6 mg/dL (ref 0.0–1.2)
Bilirubin, Direct: 0.17 mg/dL (ref 0.00–0.40)
Total Protein: 6.6 g/dL (ref 6.0–8.5)

## 2020-06-29 LAB — LIPID PANEL
Chol/HDL Ratio: 2.7 ratio (ref 0.0–4.4)
Cholesterol, Total: 248 mg/dL — ABNORMAL HIGH (ref 100–199)
HDL: 92 mg/dL (ref >39)
LDL Chol Calc (NIH): 149 mg/dL — ABNORMAL HIGH (ref 0–99)
Triglycerides: 43 mg/dL (ref 0–149)
VLDL Cholesterol Cal: 7 mg/dL (ref 5–40)

## 2020-07-01 ENCOUNTER — Other Ambulatory Visit: Payer: Self-pay

## 2020-07-01 ENCOUNTER — Emergency Department (HOSPITAL_COMMUNITY)
Admission: EM | Admit: 2020-07-01 | Discharge: 2020-07-01 | Disposition: A | Discharge status: Home / Self Care | Payer: Medicare Other | Attending: Emergency Medicine | Admitting: Emergency Medicine

## 2020-07-01 ENCOUNTER — Encounter (HOSPITAL_COMMUNITY): Payer: Self-pay | Admitting: Emergency Medicine

## 2020-07-01 DIAGNOSIS — E039 Hypothyroidism, unspecified: Secondary | ICD-10-CM | POA: Insufficient documentation

## 2020-07-01 DIAGNOSIS — Z85828 Personal history of other malignant neoplasm of skin: Secondary | ICD-10-CM | POA: Diagnosis not present

## 2020-07-01 DIAGNOSIS — Z7951 Long term (current) use of inhaled steroids: Secondary | ICD-10-CM | POA: Insufficient documentation

## 2020-07-01 DIAGNOSIS — R42 Dizziness and giddiness: Secondary | ICD-10-CM | POA: Diagnosis not present

## 2020-07-01 DIAGNOSIS — R5383 Other fatigue: Secondary | ICD-10-CM | POA: Diagnosis present

## 2020-07-01 DIAGNOSIS — E162 Hypoglycemia, unspecified: Secondary | ICD-10-CM

## 2020-07-01 DIAGNOSIS — J45909 Unspecified asthma, uncomplicated: Secondary | ICD-10-CM | POA: Insufficient documentation

## 2020-07-01 LAB — CBC
HCT: 45.2 % (ref 36.0–46.0)
Hemoglobin: 14.8 g/dL (ref 12.0–15.0)
MCH: 30.7 pg (ref 26.0–34.0)
MCHC: 32.7 g/dL (ref 30.0–36.0)
MCV: 93.8 fL (ref 80.0–100.0)
Platelets: 223 10*3/uL (ref 150–400)
RBC: 4.82 MIL/uL (ref 3.87–5.11)
RDW: 11.9 % (ref 11.5–15.5)
WBC: 5.4 10*3/uL (ref 4.0–10.5)
nRBC: 0 % (ref 0.0–0.2)

## 2020-07-01 LAB — CBG MONITORING, ED
Glucose-Capillary: 115 mg/dL — ABNORMAL HIGH (ref 70–99)
Glucose-Capillary: 104 mg/dL — ABNORMAL HIGH (ref 70–99)
Glucose-Capillary: 52 mg/dL — ABNORMAL LOW (ref 70–99)
Glucose-Capillary: 111 mg/dL — ABNORMAL HIGH (ref 70–99)
Glucose-Capillary: 70 mg/dL (ref 70–99)

## 2020-07-01 LAB — HEPATIC FUNCTION PANEL
ALT: 24 U/L (ref 0–44)
AST: 37 U/L (ref 15–41)
Albumin: 4.2 g/dL (ref 3.5–5.0)
Alkaline Phosphatase: 70 U/L (ref 38–126)
Bilirubin, Direct: 0.2 mg/dL (ref 0.0–0.2)
Indirect Bilirubin: 0.5 mg/dL (ref 0.3–0.9)
Total Bilirubin: 0.7 mg/dL (ref 0.3–1.2)
Total Protein: 6.5 g/dL (ref 6.5–8.1)

## 2020-07-01 LAB — BASIC METABOLIC PANEL
Anion gap: 9 (ref 5–15)
BUN: 14 mg/dL (ref 8–23)
CO2: 27 mmol/L (ref 22–32)
Calcium: 9.5 mg/dL (ref 8.9–10.3)
Chloride: 102 mmol/L (ref 98–111)
Creatinine, Ser: 0.74 mg/dL (ref 0.44–1.00)
GFR, Estimated: >60 mL/min (ref >60)
Glucose, Bld: 128 mg/dL — ABNORMAL HIGH (ref 70–99)
Potassium: 3.7 mmol/L (ref 3.5–5.1)
Sodium: 138 mmol/L (ref 135–145)

## 2020-07-01 LAB — URINALYSIS, ROUTINE W REFLEX MICROSCOPIC
Bilirubin Urine: NEGATIVE
Glucose, UA: NEGATIVE mg/dL
Hgb urine dipstick: NEGATIVE
Ketones, ur: >80 mg/dL — AB
Leukocytes,Ua: NEGATIVE
Nitrite: NEGATIVE
Protein, ur: NEGATIVE mg/dL
Specific Gravity, Urine: 1.01 (ref 1.005–1.030)
pH: 6 (ref 5.0–8.0)

## 2020-07-01 LAB — LIPASE, BLOOD: Lipase: 57 U/L — ABNORMAL HIGH (ref 11–51)

## 2020-07-01 MED ORDER — DEXTROSE 50 % IV SOLN
1.0000 | Freq: Once | INTRAVENOUS | Status: AC
  Administered 2020-07-01: 50 mL via INTRAVENOUS
  Filled 2020-07-01: qty 50

## 2020-07-01 NOTE — ED Notes (Signed)
Pt's CBG result was 111. Informed Emilee - RN.

## 2020-07-01 NOTE — ED Provider Notes (Addendum)
[facility] EMERGENCY DEPARTMENT Provider Note   CSN: [id number] Arrival date & time: 07/01/20  1313     History Chief Complaint  Patient presents with  . Headache  . Weakness  . Hypoglycemia    Megan [name] is a 64 y.o. female with a past medical history of anxiety, hypothyroidism, presenting to the ED with a chief complaint of fatigue, dizziness, lightheadedness and shakiness that began this morning.  Had a headache since yesterday.  This morning she woke up and skipped breakfast like usual.  When she went to go take a shower she felt like she was going to pass out.  She took Tylenol which improved her headache from a 6/10 to a 3/10.  Denies any blurry vision, numbness in arms or legs, vomiting or neck stiffness.  She presented to urgent care, found to have sugar of 45.  She was given food with improvement in her symptoms and blood sugars. She denies history of diabetes or insulin use.  She does state that ever since a dental surgery 3 weeks ago she has been eating only softer foods including ice cream.  She was told recently that her cholesterol was up so "yesterday I stopped eating all other sugar, so I do not know if this is what caused that."  She denies any history of similar symptoms in the past.  No prior stroke, injuries or falls, anticoagulant use.  States that she gets MRIs of her brain annually by her neurologist to be evaluated for possible MS that have been thus far reassuring.  HPI     Past Medical History:  Diagnosis Date  . Allergic rhinitis   . Allergy   . Aneurysm of cardiac wall, congenital    a.  septal aneurysm extending into the RVOT (by cardiac MRI in 08/2013)  . Anxiety   . Aortic atherosclerosis ([place])   . Arthritis   . Asthma   . Basal cell carcinoma   . Depression   . Heart murmur   . Hypothyroidism   . Leukopenia    idiopathic  . Lyme disease   . Mitral valve prolapse   . MVP (mitral valve prolapse)   . Orthostatic hypotension    . Osteoporosis   . Pancreatic cyst   . Peripheral neuropathy 06/09/2012  . Pernicious anemia   . Pernicious anemia   . Pernicious anemia   . Plantar fasciitis   . Pneumothorax     Patient Active Problem List   Diagnosis Date Noted  . Iron deficiency anemia 07/14/2019  . Vitamin D deficiency, unspecified 07/30/2018  . Age-related osteoporosis without current pathological fracture 07/30/2018  . Estrogen excess 06/18/2018  . Hypogammaglobulinemia ([place]) 01/11/2018  . Right hip pain 12/30/2017  . Bilateral ankle pain 11/09/2017  . Hypothyroidism 10/27/2017  . Hair loss 04/25/2017  . Neuropathic pain of left lower extremity 04/22/2017  . Great toe pain, left 04/10/2017  . Precordial chest pain 12/17/2016  . Aneurysm of right ventricle of heart 12/17/2016  . Chronic fatigue 03/05/2016  . Neuropathy associated with MGUS ([place]) 12/26/2013  . Orthostatic hypotension 08/01/2013  . Mitral valve prolapse 08/01/2013  . Spider veins of limb 02/15/2013  . Leukopenia 05/13/2012  . Bilateral Dorsal Foot pain 04/23/2011  . Gait abnormality 04/23/2011  . Leg length inequality 04/23/2011  . ASTHMA 11/16/2008  . Nonspecific (abnormal) findings on radiological and other examination of body structure 07/24/2008  . ABNORMAL CHEST XRAY 07/24/2008    Past Surgical History:  Procedure  Laterality Date  . COLONOSCOPY WITH PROPOFOL N/A 02/22/2013   Procedure: COLONOSCOPY WITH PROPOFOL;  Surgeon: Garlan Fair, MD;  Location: [facility] ENDOSCOPY;  Service: Endoscopy;  Laterality: N/A;  . MOUTH SURGERY  01/2012   bone graft in mouth  . NASAL SINUS SURGERY Right 06/12/2014   Procedure: RIGHT ENDOSCOPIC MAXILLARY ANTROSTOMY;  Surgeon: Ascencion Dike, MD;  Location: [facility];  Service: ENT;  Laterality: Right;  . ROOT CANAL  02/27/2012  . TONSILLECTOMY  1962     OB History   No obstetric history on file.     Family History  Problem Relation Age of Onset  . Hyperlipidemia Father   .  Heart failure Father   . Prostate cancer Father   . Kidney failure Father   . Hypertension Father   . Angina Mother   . Hypertension Mother   . Lung cancer Mother   . Colon cancer Mother   . Lung cancer Paternal Grandfather   . Breast cancer Paternal Grandmother   . Diabetes Paternal Grandmother   . Esophageal cancer Neg Hx   . Liver cancer Neg Hx   . Pancreatic cancer Neg Hx   . Rectal cancer Neg Hx   . Stomach cancer Neg Hx     Social History   Tobacco Use  . Smoking status: Never Smoker  . Smokeless tobacco: Never Used  Vaping Use  . Vaping Use: Never used  Substance Use Topics  . Alcohol use: No    Alcohol/week: 0.0 standard drinks  . Drug use: No    Home Medications Prior to Admission medications   Medication Sig Start Date End Date Taking? Authorizing Provider  5-Hydroxytryptophan (5-HTP) 100 MG CAPS Take 100 mg by mouth daily.     [provider]  5-Methyltetrahydrofolate (METHYL FOLATE) POWD Take 680 mcg/L by mouth daily.    [provider]  albuterol (VENTOLIN HFA) 108 (90 Base) MCG/ACT inhaler Inhale into the lungs. 05/24/20   [provider]  Albuterol Sulfate (PROAIR RESPICLICK) 762 (90 Base) MCG/ACT AEPB ProAir RespiClick 90 mcg/actuation breath activated    [provider]  Alpha-Lipoic Acid 600 MG TABS Take 600 mg by mouth daily.    [provider]  AMINO ACIDS COMPLEX PO Take 1 Scoop by mouth daily.     [provider]  amoxicillin (AMOXIL) 500 MG tablet Take 500 mg by mouth 3 (three) times daily. 06/06/20   [provider]  ARMOUR THYROID 30 MG tablet Take 30 mg by mouth daily. 11/20/16   [provider]  Ascorbic Acid (VITAMIN C PO) Take 2,500 Units by mouth daily.     [provider]  azelastine (OPTIVAR) 0.05 % ophthalmic solution azelastine 0.05 % eye drops  INSTILL 1 DROP INTO AFFECTED EYE TWICE A DAY    [provider]  Biotin 10 MG TABS Take by mouth.     [provider]  Cholecalciferol (VITAMIN D3) 10000 units capsule Take 10,000 Units by mouth daily.     [provider]  clonazePAM (KLONOPIN) 1 MG tablet Take 1 mg by mouth 2 (two) times daily as needed. 01/10/20   [provider]  Cyanocobalamin (VITAMIN B-12 ER PO) Take 5,000 mcg by mouth daily.     [provider]  GLUTATHIONE PO Take 250 mg by mouth daily.    [provider]  hydrocortisone valerate cream (WESTCORT) 0.2 % Apply topically. 05/16/20   [provider]  hydrOXYzine (ATARAX/VISTARIL) 10 MG tablet  hydroxyzine HCl 10 mg tablet  TAKE 1 2 TABLETS BY MOUTH EVERY 4 6 HOURS AS NEEDED FOR ITCHING 05/15/20   [provider]  Iron-DSS-B12-FA-C-E-Cu-Biotin (HEMAX) 150-1 MG TABS Take by mouth.    [provider]  Magnesium Ascorbate POWD Take 600 mg by mouth daily.    [provider]  MAGNESIUM CITRATE PO Take 800 mg by mouth daily.     [provider]  magnesium citrate SOLN Take by mouth.    [provider]  meloxicam (MOBIC) 7.5 MG tablet Take 7.5 mg by mouth daily. 05/23/20   [provider]  Menaquinone-7 (VITAMIN K2 PO) Take 1 tablet by mouth daily.    [provider]  NONFORMULARY OR COMPOUNDED ITEM 2 drops in the morning and at bedtime. Estradiol-Progesterone 0.06-5-0.33 MG liquid drops    [provider]  NONFORMULARY OR COMPOUNDED ITEM Place 1 mg vaginally. Estriol VB 1MG  0.1% cream    [provider]    Allergies    Augmentin [amoxicillin-pot clavulanate] and Clindamycin/lincomycin  Review of Systems   Review of Systems  Constitutional: Negative for appetite change, chills and fever.  HENT: Negative for ear pain, rhinorrhea, sneezing and sore throat.   Eyes: Negative for photophobia and visual disturbance.  Respiratory: Negative for cough, chest tightness, shortness of breath and wheezing.   Cardiovascular: Negative for chest pain and  palpitations.  Gastrointestinal: Negative for abdominal pain, blood in stool, constipation, diarrhea, nausea and vomiting.  Genitourinary: Negative for dysuria, hematuria and urgency.  Musculoskeletal: Negative for myalgias.  Skin: Negative for rash.  Neurological: Positive for dizziness, light-headedness and headaches. Negative for weakness.    Physical Exam Updated Vital Signs BP 125/70   Pulse 71   Temp 98.4 F (36.9 C) (Oral)   Resp 15   SpO2 100%   Physical Exam Vitals and nursing note reviewed.  Constitutional:      General: She is not in acute distress.    Appearance: She is well-developed.  HENT:     Head: Normocephalic and atraumatic.     Nose: Nose normal.  Eyes:     General: No scleral icterus.       Right eye: No discharge.        Left eye: No discharge.     Conjunctiva/sclera: Conjunctivae normal.     Pupils: Pupils are equal, round, and reactive to light.  Cardiovascular:     Rate and Rhythm: Normal rate and regular rhythm.     Heart sounds: Normal heart sounds. No murmur heard.  No friction rub. No gallop.   Pulmonary:     Effort: Pulmonary effort is normal. No respiratory distress.     Breath sounds: Normal breath sounds.  Abdominal:     General: Bowel sounds are normal. There is no distension.     Palpations: Abdomen is soft.     Tenderness: There is no abdominal tenderness. There is no guarding.     Comments: Soft, nontender nondistended.  Musculoskeletal:        General: Normal range of motion.     Cervical back: Normal range of motion and neck supple.  Skin:    General: Skin is warm and dry.     Findings: No rash.  Neurological:     General: No focal deficit present.     Mental Status: She is alert and oriented to person, place, and time.     Cranial Nerves: No cranial nerve deficit.     Sensory: No sensory deficit.  Motor: No weakness or abnormal muscle tone.     Coordination: Coordination normal.     Comments: Pupils reactive. No facial  asymmetry noted. Cranial nerves appear grossly intact. Sensation intact to light touch on face, BUE and BLE. Strength 5/5 in BUE and BLE.  Normal gait.  No tremors noted.     ED Results / Procedures / Treatments   Labs (all labs ordered are listed, but only abnormal results are displayed) Labs Reviewed  BASIC METABOLIC PANEL - Abnormal; Notable for the following components:      Result Value   Glucose, Bld 128 (*)    All other components within normal limits  URINALYSIS, ROUTINE W REFLEX MICROSCOPIC - Abnormal; Notable for the following components:   APPearance HAZY (*)    Ketones, ur >80 (*)    All other components within normal limits  CBG MONITORING, ED - Abnormal; Notable for the following components:   Glucose-Capillary 115 (*)    All other components within normal limits  CBG MONITORING, ED - Abnormal; Notable for the following components:   Glucose-Capillary 104 (*)    All other components within normal limits  CBG MONITORING, ED - Abnormal; Notable for the following components:   Glucose-Capillary 52 (*)    All other components within normal limits  CBC  HEPATIC FUNCTION PANEL    EKG EKG Interpretation  Date/Time:  Sunday July 01 2020 13:29:15 EST Ventricular Rate:  77 PR Interval:  166 QRS Duration: 86 QT Interval:  394 QTC Calculation: 445 R Axis:   97 Text Interpretation: Sinus rhythm with Premature atrial complexes Rightward axis Nonspecific ST and T wave abnormality Abnormal ECG Confirmed by Messick, Peter ([id number]) on 07/01/2020 2:41:42 PM   Radiology No results found.  Procedures Procedures (including critical care time)  Medications Ordered in ED Medications  dextrose 50 % solution 50 mL (has no administration in time range)    ED Course  I have reviewed the triage vital signs and the nursing notes.  Pertinent labs & imaging results that were available during my care of the patient were reviewed by me and considered in my medical decision  making (see chart for details).  Clinical Course as of Jul 01 1504  Sun Jul 01, 2020  1411 Creatinine: 0.74 [HK]    Clinical Course User Index [HK] Avanelle Pixley, PA-C   MDM Rules/Calculators/A&P                          64 yo F presenting to the ED for lightheadedness, dizziness, shakiness and headache.  Headache began yesterday of other symptoms began today.  She skipped breakfast as usual this morning when she went to go take a shower she felt like she was going to pass out.  She presented to the urgent care and found to be hypoglycemic at 45.  This improved with p.o. intake.  She did take Tylenol for headache which has significantly improved it.  She does report recent dental surgery 3 weeks ago and changing her diet since then.  On exam patient without any neurological deficits.  No numbness or weakness noted on exam.  She remains ambulatory with normal gait.  No meningeal signs.  All signs are within normal limits.  Blood sugar here is in the 100s.  Will obtain additional lab work.  We will need to monitor blood sugars for improvement.  Lab work including BMP, CBC, LFTs unremarkable. Urinalysis with some ketones but otherwise unremarkable. She remains  overall well-appearing.  Had a shared decision making discussion with the patient regarding imaging of the head.  She does not feel that her headache is anything that is unusual or severe.  She states that "I do not usually get headaches so it just feels different but it is not that bad."  She feels reassured that she sees her neurologist annually and has imaging annually as well.  Feel that we can defer imaging at this time.  There are no headache characteristics that are lateralizing or concerning for increased ICP, infectious or vascular cause of her symptoms and her neurological exam without abnormalities.  She remains in no acute distress.  Suspect that this episode of hypoglycemia is due to her recent diet changes.  Advised her to incorporate  some carbohydrates back into her diet and to follow-up with her primary care provider.  Return precautions given. Patient cussed with and seen by the attending, Dr. Francia Greaves.  3:05 PM On recheck patient's blood sugar in the 50s.  Will give amp of D50 and allow her to eat and reassess. Will need to recheck CBG to evaluate for any subsequent episodes of hypoglycemia. Care handed off to oncoming provider pending recheck. Anticipate discharge home if CBG remains improved.   Portions of this note were generated with [employer]. Dictation errors may occur despite best attempts at proofreading.  Final Clinical Impression(s) / ED Diagnoses Final diagnoses:  Hypoglycemia    Rx / DC Orders ED Discharge Orders    None         Delia Heady, PA-C 07/01/20 1511    Valarie Merino, MD 07/03/20 [phone]

## 2020-07-01 NOTE — Discharge Instructions (Addendum)
If an episode similar to this happens again, make sure you eat or drink something with sugar and electrolytes in it. Follow-up with your primary care provider. Return to the ER if you start to experience worsening symptoms, numbness on 1 side of your body, severe headache or blurry vision, chest pain.

## 2020-07-01 NOTE — ED Provider Notes (Signed)
Care assumed from [facility], PA-C at shift change with re-evaluation pending.   In brief, this patient is a 64 y.o. F with past medical history of anxiety, hypothyroidism presenting for evaluation of fatigue, lightheadedness, shakiness that began this morning.  She woke up and skipped breakfast.  She went to take a shower and felt lightheaded like she was going to pass out.  She went to urgent care and was found to be hypoglycemic with a blood sugar of 45 was sent to the ED for further evaluation.  She states she recently had a dental procedure 3 weeks ago and has been eating only softer fluids, including ice cream.  She was told a few days ago that her cholesterol was high so yesterday she stopped eating all things that contain sugar.  No history of diabetes.  Patient had had some headache.  She follows with a neurologist.  She had declined a head CT with previous provider.  Please see note from previous provider for full history/physical exam.    Physical Exam  BP 125/70   Pulse 71   Temp 98.4 F (36.9 C) (Oral)   Resp 15   SpO2 100%   Physical Exam  ED Course/Procedures   Clinical Course as of Jul 01 1505  Sun Jul 01, 2020  1411 Creatinine: 0.74 [HK]    Clinical Course User Index [HK] Delia Heady, PA-C    Procedures   Results for orders placed or performed during the hospital encounter of 07/01/20 (from the past 24 hour(s))  CBG monitoring, ED     Status: Abnormal   Collection Time: 07/01/20  1:28 PM  Result Value Ref Range   Glucose-Capillary 115 (H) 70 - 99 mg/dL  Basic metabolic panel     Status: Abnormal   Collection Time: 07/01/20  1:34 PM  Result Value Ref Range   Sodium 138 135 - 145 mmol/L   Potassium 3.7 3.5 - 5.1 mmol/L   Chloride 102 98 - 111 mmol/L   CO2 27 22 - 32 mmol/L   Glucose, Bld 128 (H) 70 - 99 mg/dL   BUN 14 8 - 23 mg/dL   Creatinine, Ser 0.74 0.44 - 1.00 mg/dL   Calcium 9.5 8.9 - 10.3 mg/dL   GFR, Estimated >60 >60 mL/min   Anion gap 9 5 - 15   CBC     Status: None   Collection Time: 07/01/20  1:34 PM  Result Value Ref Range   WBC 5.4 4.0 - 10.5 K/uL   RBC 4.82 3.87 - 5.11 MIL/uL   Hemoglobin 14.8 12.0 - 15.0 g/dL   HCT 45.2 36 - 46 %   MCV 93.8 80.0 - 100.0 fL   MCH 30.7 26.0 - 34.0 pg   MCHC 32.7 30.0 - 36.0 g/dL   RDW 11.9 11.5 - 15.5 %   Platelets 223 150 - 400 K/uL   nRBC 0.0 0.0 - 0.2 %  Hepatic function panel     Status: None   Collection Time: 07/01/20  1:34 PM  Result Value Ref Range   Total Protein 6.5 6.5 - 8.1 g/dL   Albumin 4.2 3.5 - 5.0 g/dL   AST 37 15 - 41 U/L   ALT 24 0 - 44 U/L   Alkaline Phosphatase 70 38 - 126 U/L   Total Bilirubin 0.7 0.3 - 1.2 mg/dL   Bilirubin, Direct 0.2 0.0 - 0.2 mg/dL   Indirect Bilirubin 0.5 0.3 - 0.9 mg/dL  CBG monitoring, ED  Status: Abnormal   Collection Time: 07/01/20  1:46 PM  Result Value Ref Range   Glucose-Capillary 104 (H) 70 - 99 mg/dL  Urinalysis, Routine w reflex microscopic Urine, Random     Status: Abnormal   Collection Time: 07/01/20  2:01 PM  Result Value Ref Range   Color, Urine YELLOW YELLOW   APPearance HAZY (A) CLEAR   Specific Gravity, Urine 1.010 1.005 - 1.030   pH 6.0 5.0 - 8.0   Glucose, UA NEGATIVE NEGATIVE mg/dL   Hgb urine dipstick NEGATIVE NEGATIVE   Bilirubin Urine NEGATIVE NEGATIVE   Ketones, ur >80 (A) NEGATIVE mg/dL   Protein, ur NEGATIVE NEGATIVE mg/dL   Nitrite NEGATIVE NEGATIVE   Leukocytes,Ua NEGATIVE NEGATIVE  POC CBG, ED     Status: Abnormal   Collection Time: 07/01/20  3:03 PM  Result Value Ref Range   Glucose-Capillary 52 (L) 70 - 99 mg/dL    MDM    PLAN: Patient's repeat CBG was 52.  Patient given amp of D50 as well as food.  Will recheck.  MDM:  UA negative.  BMP plus LFTs within normal limits.  CBC unremarkable.  Her glucose on her BMP was 128.  Her repeat blood sugar check was 52.  Patient given amp of D50.  We will plan to recheck.  Her repeat CBG is 111.  I discussed results with patient.  I discussed  with patient that this could be result of her not eating given the recent dietary changes.  Patient would like to check 1 more blood sugar to ensure that it does not drop any more.  Additionally, we discussed at length regarding her history.  She has a history of pancreatic cyst.  She has not had any difficulties with that and follows with GI regarding it.  At this time, do not think that is affecting her blood sugar but will add on a lipase for evaluation.  Lipase is 57.  I reviewed her previous ones and she has been elevated for the past of 54.  Reevaluation.  Patient's repeat blood sugar is 70.  I discussed at length with patient regarding her blood sugars.  Patient's blood sugar responded nicely when she ate and has dropped appropriately and stayed in the normal range.  Patient has been ambulatory in the ED without any difficulty. At this time, patient exhibits no emergent life-threatening condition that require further evaluation in ED. Patient had ample opportunity for questions and discussion. All patient's questions were answered with full understanding. Strict return precautions discussed. Patient expresses understanding and agreement to plan.    1. Hypoglycemia    Portions of this note were generated with Dragon dictation software. Dictation errors may occur despite best attempts at proofreading.     Volanda Napoleon, PA-C 07/02/20 2356    Tegeler, Gwenyth Allegra, MD 07/04/20 [phone]

## 2020-07-01 NOTE — ED Notes (Signed)
Pt's CBG upon discharge was 52. Pt currently asymptomatic at this time. AOx4 Dr. Francia Greaves notified. 1 amp of D50 ordered and pt allowed to eat foods PO.

## 2020-07-01 NOTE — ED Triage Notes (Addendum)
Pt from [place].  Reports headache that started yesterday.  Denies history of headaches.  Reports feeling shaky, generalized weakness, and dizziness since this morning.  States headache has decreased to 3/10.  Pt reports CBG 45 @ [facility].  Pt given glucose tablets, trail mix, and energy bar while there.  No history of diabetes.  Denies nausea and vomiting.  No arm drift.

## 2020-07-04 IMAGING — MR MR 3D RECON AT [facility]
18 of 19 series · 46 of 48 positions shown · IV contrast (gadavist)
Comparison: CTs on 07/20/2019 and 08/08/2016

CLINICAL DATA: Follow-up indeterminate pancreatic cystic lesion.



[Series 5: T2 fat-sat · axial · 6.0mm · 1.25mm/px · z∈[-310,-58]mm · 2 of 36 slices shown]
[im 1/36]
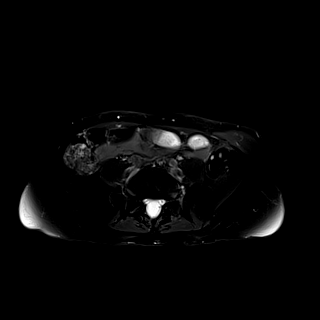
[im 36/36]
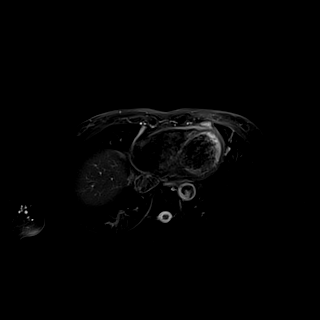

[Series 6: DWI · axial · 6.0mm · 1.49mm/px · z∈[-310,-58]mm · 4 of 72 slices shown (1 of 2)]
[im 1/72]
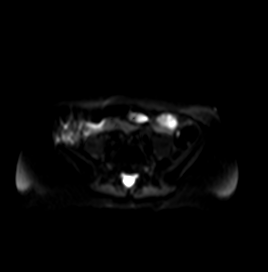
[im 24/72]
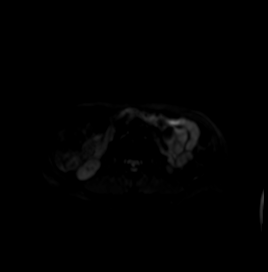
[im 48/72]
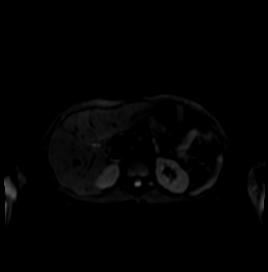
[im 72/72]
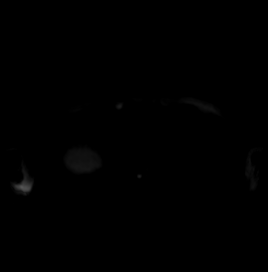

[Series 7: DWI · axial · 6.0mm · 1.49mm/px · z∈[-310,-58]mm · 2 of 36 slices shown (2 of 2)]
[im 1/36]
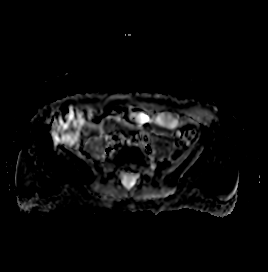
[im 36/36]
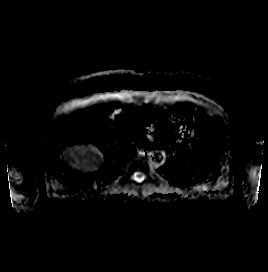

[Series 9: bSSFP · coronal · 6.0mm · 0.74mm/px · 1 of 33 slices shown]
[im 1/33]
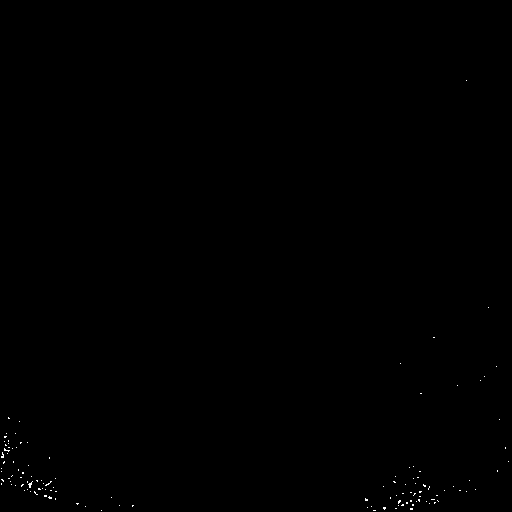

[Series 10: T1 · axial · 3.0mm · 1.25mm/px · z∈[-293,-56]mm · 3 of 80 slices shown (1 of 2)]
[im 1/80]
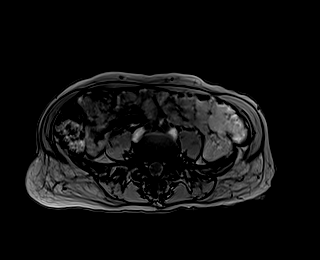
[im 40/80]
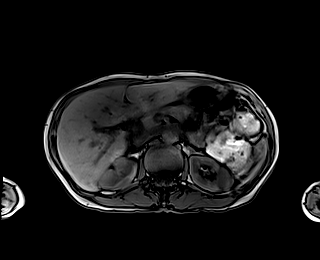
[im 80/80]
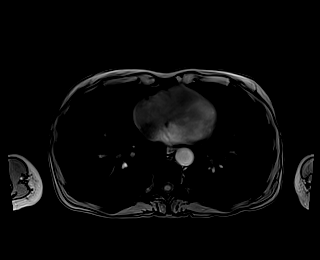

[Series 11: T1 · axial · 3.0mm · 1.25mm/px · z∈[-293,-56]mm · 3 of 80 slices shown (2 of 2)]
[im 1/80]
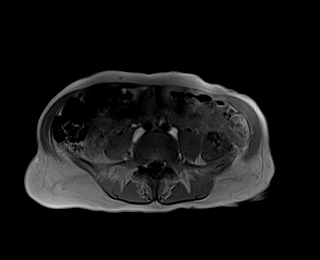
[im 40/80]
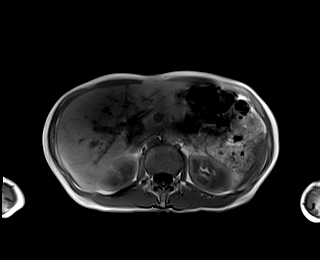
[im 80/80]
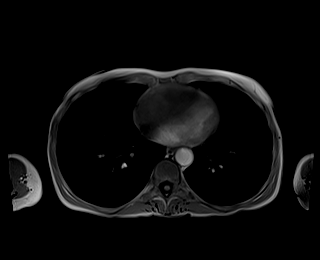

[Series 12: cor obl thk · coronal · 50.0mm · 0.78mm/px · 1 of 9 slices shown]
[im 1/9]
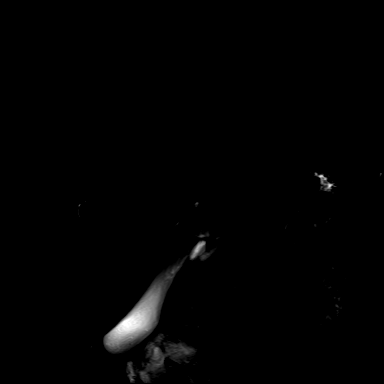

[Series 14: T2 · axial · 6.0mm · 1.56mm/px · 1 of 34 slices shown]
[im 1/34]
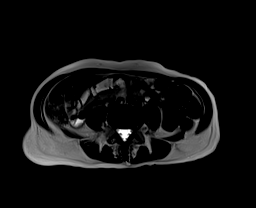

[Series 16: T1 dynamic · axial · 3.0mm · 1.25mm/px · z∈[-304,-43]mm · 3 of 88 slices shown (1 of 6)]
[im 1/88]
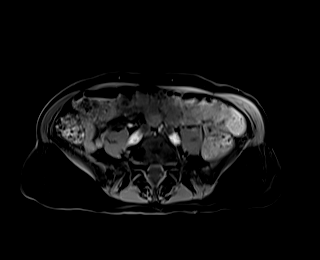
[im 44/88]
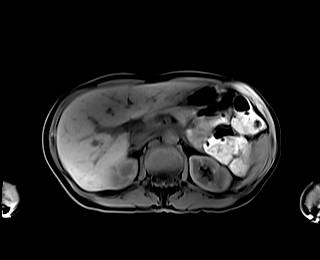
[im 88/88]
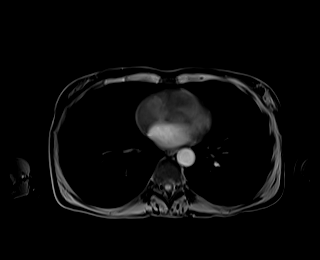

[Series 19: T1 dynamic · axial · 3.0mm · 1.25mm/px · z∈[-304,-43]mm · 3 of 88 slices shown (2 of 6)]
[im 1/88]
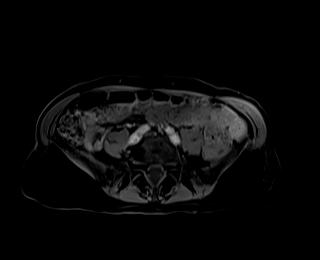
[im 44/88]
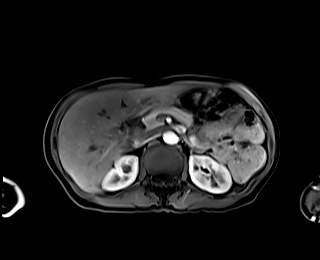
[im 88/88]
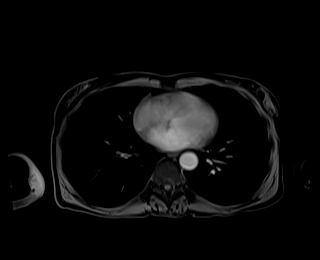

[Series 21: T1 dynamic · axial · 3.0mm · 1.25mm/px · z∈[-304,-43]mm · 3 of 88 slices shown (3 of 6)]
[im 1/88]
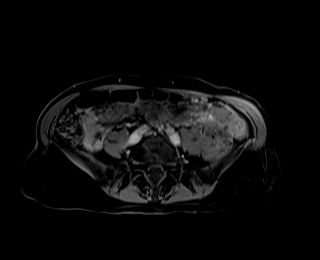
[im 44/88]
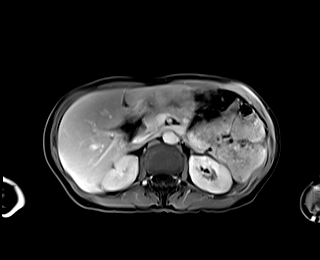
[im 88/88]
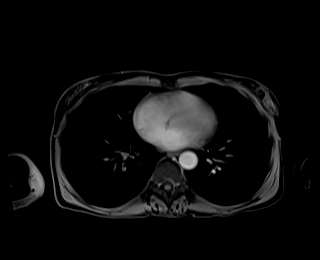

[Series 23: T1 dynamic · axial · 3.0mm · 1.25mm/px · z∈[-304,-43]mm · 3 of 88 slices shown (4 of 6)]
[im 1/88]
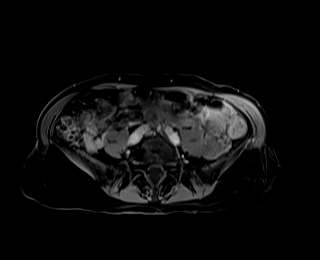
[im 44/88]
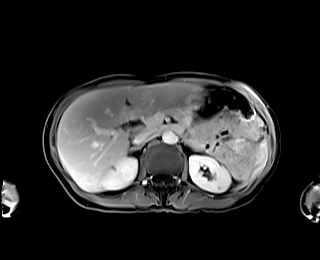
[im 88/88]
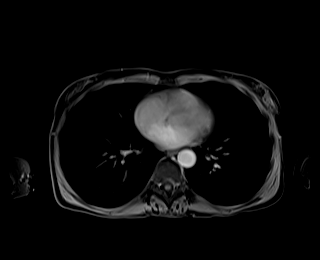

[Series 25: T1 dynamic · coronal · 3.0mm · 1.41mm/px · 2 of 64 slices shown (5 of 6)]
[im 1/64]
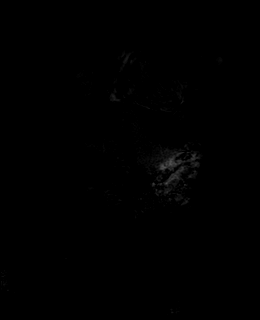
[im 64/64]
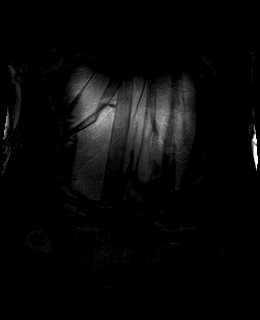

[Series 27: T1 dynamic · axial · 3.0mm · 1.25mm/px · z∈[-304,-43]mm · 3 of 88 slices shown (6 of 6)]
[im 1/88]
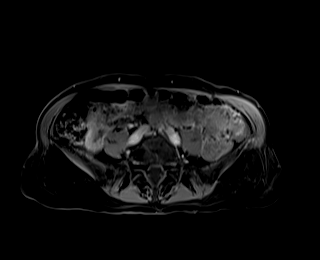
[im 44/88]
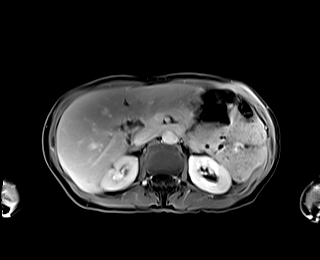
[im 88/88]
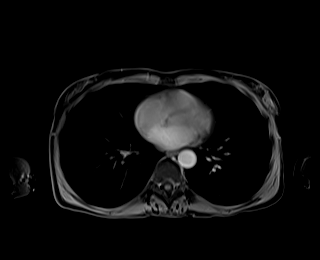

[Series 100: sub_s19-s16_1 · axial · 3.0mm · 1.25mm/px · z∈[-304,-43]mm · 3 of 88 slices shown]
[im 1/88]
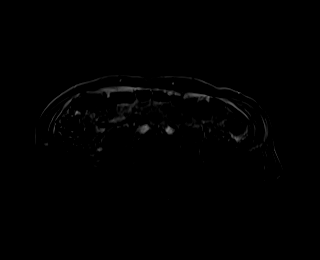
[im 44/88]
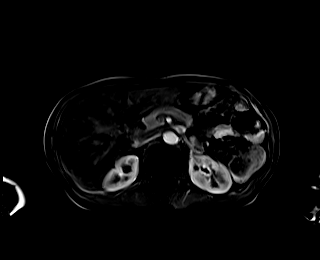
[im 88/88]
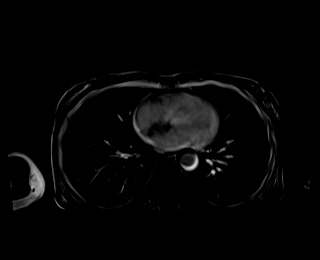

[Series 101: sub_s21-s16_1 · axial · 3.0mm · 1.25mm/px · z∈[-304,-43]mm · 3 of 88 slices shown]
[im 1/88]
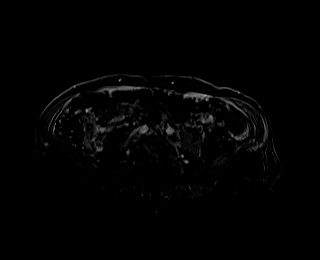
[im 44/88]
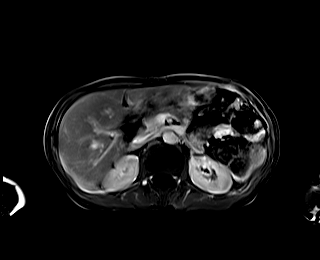
[im 88/88]
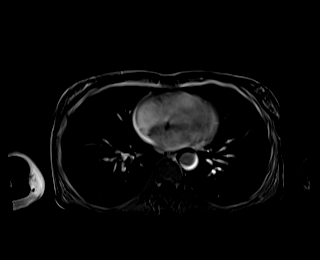

[Series 102: sub_s23-s16_1 · axial · 3.0mm · 1.25mm/px · z∈[-304,-43]mm · 3 of 88 slices shown]
[im 1/88]
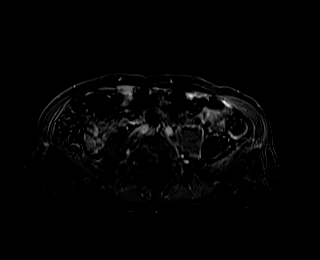
[im 44/88]
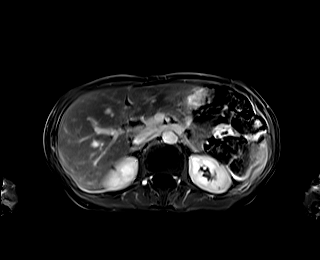
[im 88/88]
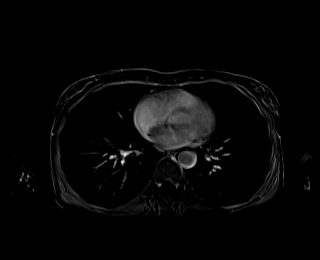

[Series 103: sub_s27-s16_1 · axial · 3.0mm · 1.25mm/px · z∈[-304,-43]mm · 3 of 88 slices shown]
[im 1/88]
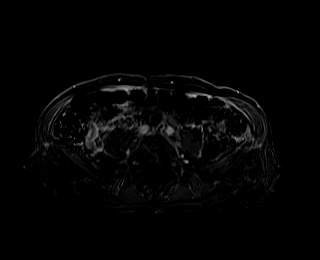
[im 44/88]
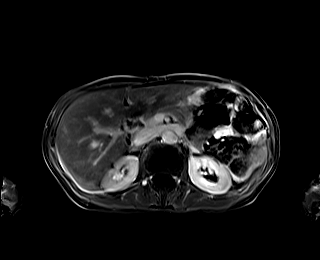
[im 88/88]
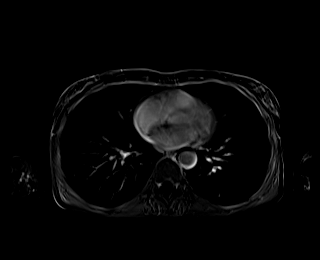

[46 of 48 positions shown; findings below may reference images not displayed]

FINDINGS: Lower chest: No acute findings.

Hepatobiliary: No hepatic masses identified. Gallbladder is
unremarkable. No evidence of biliary ductal dilatation.

Pancreas: An 8 mm cyst is seen at the junction of the pancreatic
body and tail which is stable in size and appearance to previous CTs
dating back to 8088. No solid pancreatic masses identified. No
evidence of pancreatic ductal dilatation.

Spleen:  Within normal limits in size and appearance.

Adrenals/Urinary Tract: No masses identified. No evidence of
hydronephrosis.

Stomach/Bowel: Visualized portion unremarkable.

Vascular/Lymphatic: No pathologically enlarged lymph nodes
identified. No abdominal aortic aneurysm. Congenital duplication of
IVC again noted.

Other:  None.

Musculoskeletal:  No suspicious bone lesions identified.
IMPRESSION: Stable 8 mm cyst at junction of pancreatic body and tail, likely
representing indolent cystic neoplasm such as a side-branch IPMN.
Recommend continued followup by abdomen MRI without and with
contrast in 1 year. This recommendation follows ACR consensus
guidelines: Management of Incidental Pancreatic Cysts: A White Paper
of the ACR Incidental Findings Committee. [HOSPITAL]

## 2020-07-04 IMAGING — MR MR ABDOMEN WO/W CM MRCP
18 of 19 series · 46 of 48 positions shown · IV contrast (gadavist)
Comparison: CTs on 07/20/2019 and 08/08/2016

CLINICAL DATA: Follow-up indeterminate pancreatic cystic lesion.



[Series 4: T2 fat-sat · axial · 6.0mm · 1.25mm/px · z∈[-310,-58]mm · 2 of 36 slices shown]
[im 1/36]
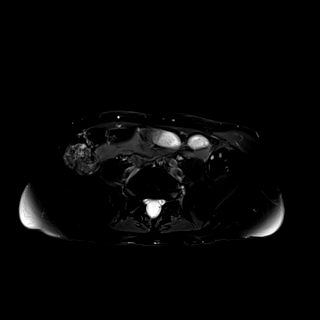
[im 36/36]
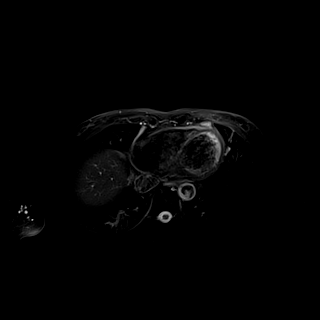

[Series 5: DWI · axial · 6.0mm · 1.49mm/px · z∈[-310,-58]mm · 4 of 72 slices shown (1 of 2)]
[im 1/72]
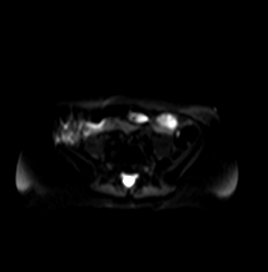
[im 24/72]
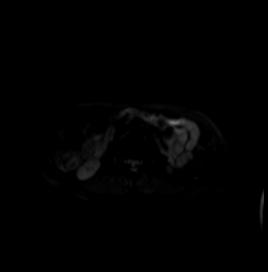
[im 48/72]
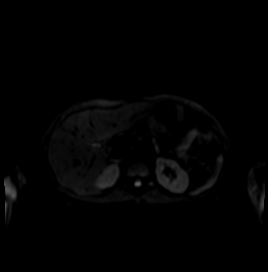
[im 72/72]
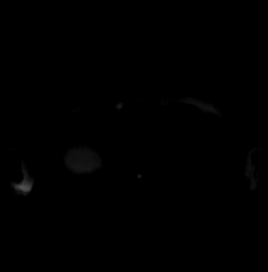

[Series 6: DWI · axial · 6.0mm · 1.49mm/px · z∈[-310,-58]mm · 2 of 36 slices shown (2 of 2)]
[im 1/36]
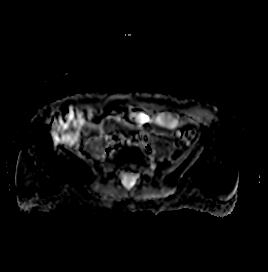
[im 36/36]
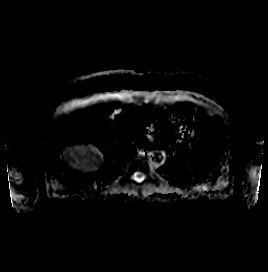

[Series 8: bSSFP · coronal · 6.0mm · 0.74mm/px · 1 of 33 slices shown]
[im 1/33]
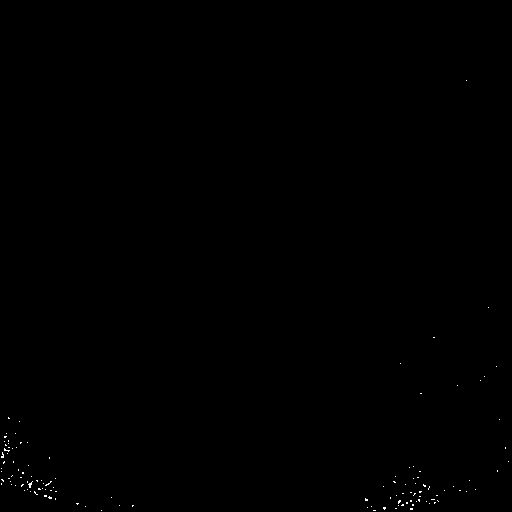

[Series 9: T1 · axial · 3.0mm · 1.25mm/px · z∈[-293,-56]mm · 3 of 80 slices shown (1 of 2)]
[im 1/80]
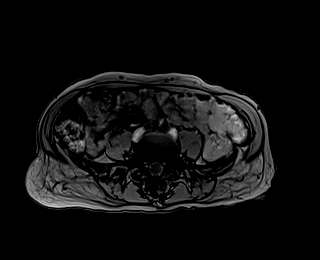
[im 40/80]
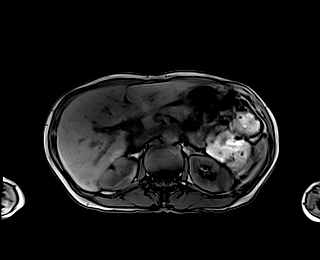
[im 80/80]
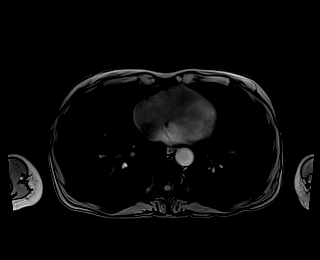

[Series 10: T1 · axial · 3.0mm · 1.25mm/px · z∈[-293,-56]mm · 3 of 80 slices shown (2 of 2)]
[im 1/80]
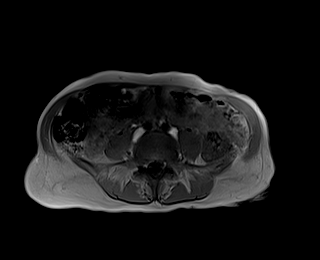
[im 40/80]
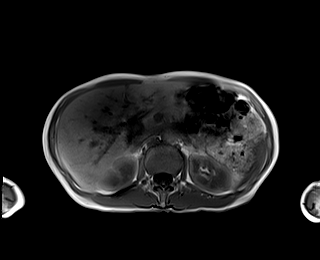
[im 80/80]
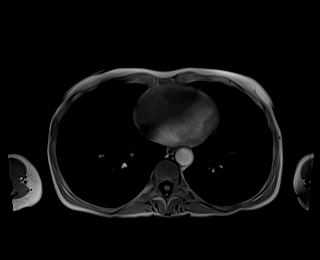

[Series 11: cor obl thk · coronal · 50.0mm · 0.78mm/px · 1 of 9 slices shown]
[im 1/9]
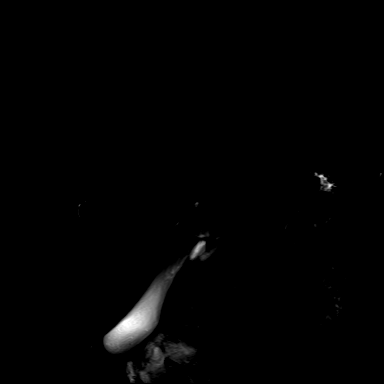

[Series 13: T2 · axial · 6.0mm · 1.56mm/px · 1 of 34 slices shown]
[im 1/34]
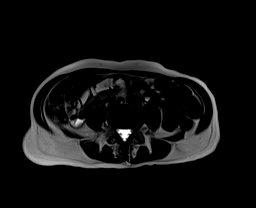

[Series 15: T1 dynamic · axial · 3.0mm · 1.25mm/px · z∈[-304,-43]mm · 3 of 88 slices shown (1 of 6)]
[im 1/88]
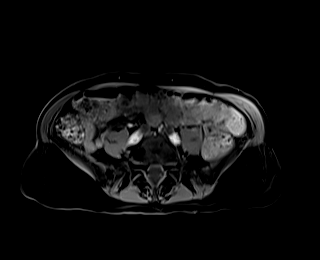
[im 44/88]
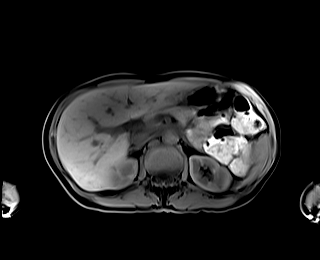
[im 88/88]
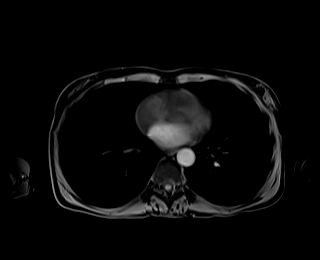

[Series 18: T1 dynamic · axial · 3.0mm · 1.25mm/px · z∈[-304,-43]mm · 3 of 88 slices shown (2 of 6)]
[im 1/88]
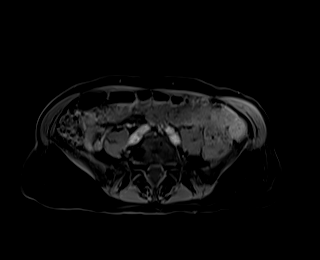
[im 44/88]
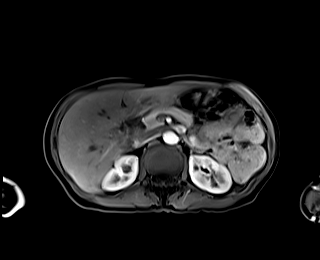
[im 88/88]
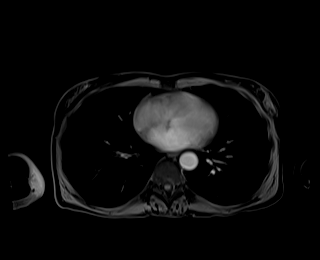

[Series 20: T1 dynamic · axial · 3.0mm · 1.25mm/px · z∈[-304,-43]mm · 3 of 88 slices shown (3 of 6)]
[im 1/88]
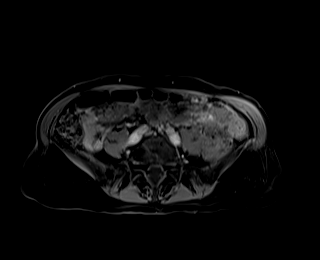
[im 44/88]
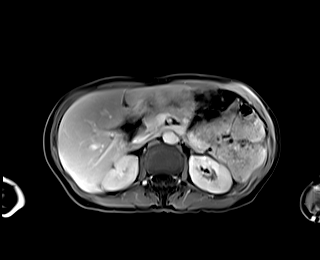
[im 88/88]
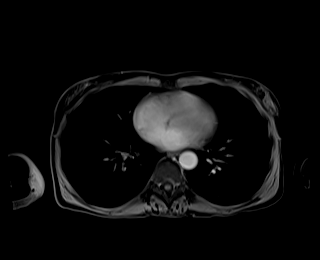

[Series 22: T1 dynamic · axial · 3.0mm · 1.25mm/px · z∈[-304,-43]mm · 3 of 88 slices shown (4 of 6)]
[im 1/88]
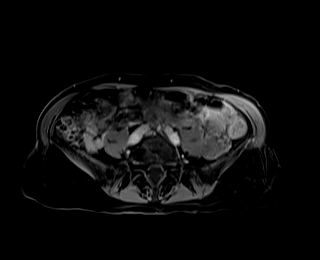
[im 44/88]
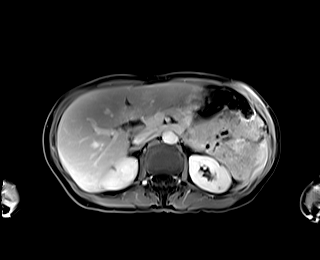
[im 88/88]
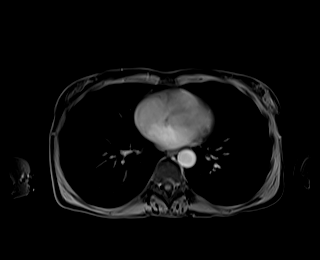

[Series 24: T1 dynamic · coronal · 3.0mm · 1.41mm/px · 2 of 64 slices shown (5 of 6)]
[im 1/64]
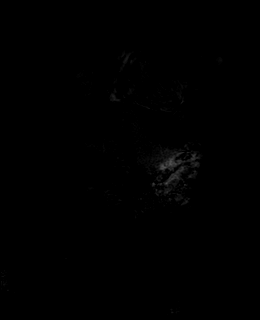
[im 64/64]
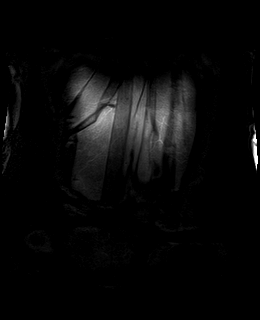

[Series 26: T1 dynamic · axial · 3.0mm · 1.25mm/px · z∈[-304,-43]mm · 3 of 88 slices shown (6 of 6)]
[im 1/88]
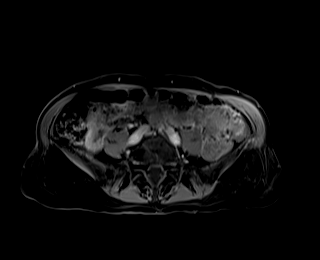
[im 44/88]
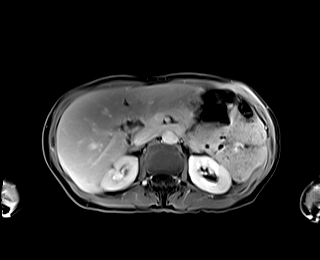
[im 88/88]
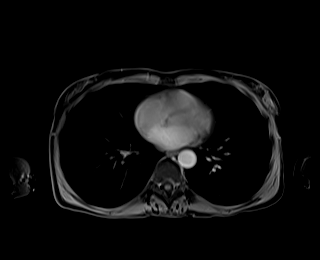

[Series 100: sub_s18-s15_1 · axial · 3.0mm · 1.25mm/px · z∈[-304,-43]mm · 3 of 88 slices shown]
[im 1/88]
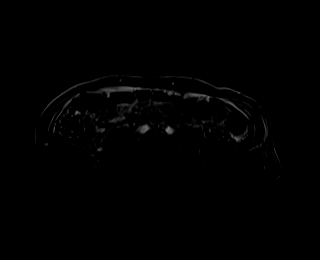
[im 44/88]
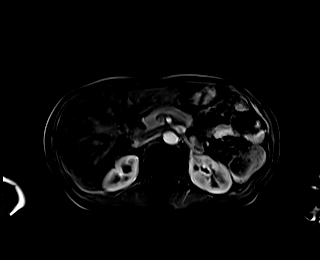
[im 88/88]
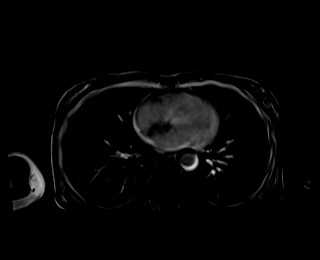

[Series 101: sub_s20-s15_1 · axial · 3.0mm · 1.25mm/px · z∈[-304,-43]mm · 3 of 88 slices shown]
[im 1/88]
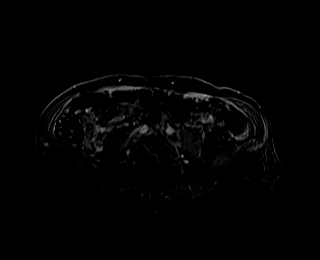
[im 44/88]
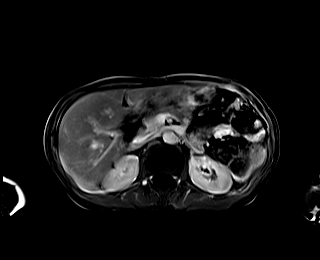
[im 88/88]
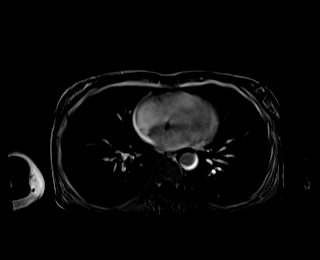

[Series 102: sub_s22-s15_1 · axial · 3.0mm · 1.25mm/px · z∈[-304,-43]mm · 3 of 88 slices shown]
[im 1/88]
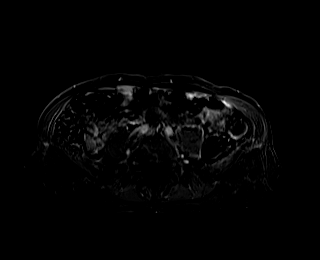
[im 44/88]
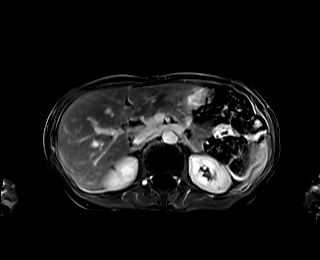
[im 88/88]
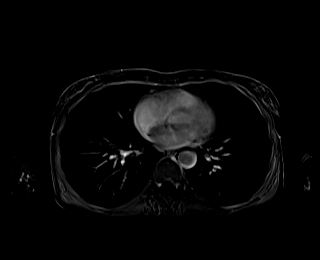

[Series 103: sub_s26-s15_1 · axial · 3.0mm · 1.25mm/px · z∈[-304,-43]mm · 3 of 88 slices shown]
[im 1/88]
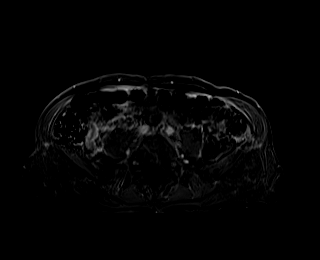
[im 44/88]
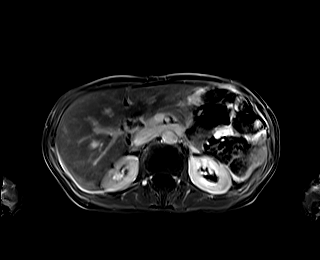
[im 88/88]
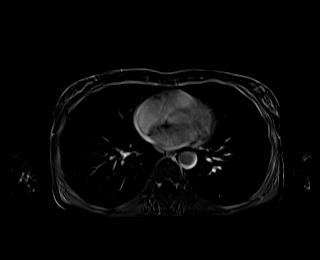

[46 of 48 positions shown; findings below may reference images not displayed]

FINDINGS: Lower chest: No acute findings.

Hepatobiliary: No hepatic masses identified. Gallbladder is
unremarkable. No evidence of biliary ductal dilatation.

Pancreas: An 8 mm cyst is seen at the junction of the pancreatic
body and tail which is stable in size and appearance to previous CTs
dating back to 8088. No solid pancreatic masses identified. No
evidence of pancreatic ductal dilatation.

Spleen:  Within normal limits in size and appearance.

Adrenals/Urinary Tract: No masses identified. No evidence of
hydronephrosis.

Stomach/Bowel: Visualized portion unremarkable.

Vascular/Lymphatic: No pathologically enlarged lymph nodes
identified. No abdominal aortic aneurysm. Congenital duplication of
IVC again noted.

Other:  None.

Musculoskeletal:  No suspicious bone lesions identified.
IMPRESSION: Stable 8 mm cyst at junction of pancreatic body and tail, likely
representing indolent cystic neoplasm such as a side-branch IPMN.
Recommend continued followup by abdomen MRI without and with
contrast in 1 year. This recommendation follows ACR consensus
guidelines: Management of Incidental Pancreatic Cysts: A White Paper
of the ACR Incidental Findings Committee. [HOSPITAL]

## 2020-07-04 NOTE — Progress Notes (Signed)
HPI: FUhyperlipidemia and dyspnea. MRA January 2015 showed aneurysmal dilatation of the membranous ventricular septum extending into the right ventricular outflow tract. There was no sinus of Valsalva aneurysm. Ejection fraction 57%. Carotid Dopplers August 2016 normal. Echocardiogram May 2018 showed normal LV systolic function and mild mitral regurgitation. 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 Stress echocardiogramFeb 2019was normal. CTA June 2019 showed aneurysm of the membranous ventricular septum that bulges into the right ventricular outflow tract. There is no connection between the left ventricle and right ventricle. There is no sinus of Valsalva aneurysm. Calcium score 0. No coronary disease. ABIs August 2020 showed noncompressible left lower extremity artery and normal right.Abdominal CT November 2020 showed 9 mm cystic lesion in the tail of the pancreas and follow-up recommended in 12 months. There was also note of aortic atherosclerosis. Seen with palpitations and monitor June 2021 showed sinus bradycardia, normal sinus rhythm, sinus tachycardia, occasional PAC, PVCs, rare couplet and brief PAT. Since last seen there is no dyspnea.  She has an occasional pain in the left chest for 2 to 3 minutes.  Not exertional.  She denies any syncope.  Current Outpatient Medications  Medication Sig Dispense Refill   5-Hydroxytryptophan (5-HTP) 100 MG CAPS Take 100 mg by mouth daily.      acetaminophen (TYLENOL) 500 MG tablet Take 250 mg by mouth every 6 (six) hours as needed for headache.     Acetylcysteine (NAC) 500 MG CAPS Take 500 mg by mouth daily.     albuterol (VENTOLIN HFA) 108 (90 Base) MCG/ACT inhaler Inhale 1 puff into the lungs daily as needed for wheezing or shortness of breath.      Alpha-Lipoic Acid 300 MG CAPS Take 300 mg by mouth daily.     ARMOUR THYROID 30 MG tablet Take 30 mg by mouth daily.  6   Ascorbic Acid (VITAMIN C PO) Take  2,500 Units by mouth daily.      chlorhexidine (PERIDEX) 0.12 % solution Use as directed 15 mLs in the mouth or throat 2 (two) times daily.     Cholecalciferol (VITAMIN D3) 10000 units capsule Take 10,000 Units by mouth daily.      clonazePAM (KLONOPIN) 1 MG tablet Take 1 mg by mouth daily as needed for anxiety.      Cyanocobalamin (VITAMIN B-12 ER PO) Take 5,000 mcg by mouth daily.      MAGNESIUM CITRATE PO Take 800 mg by mouth daily.      Menaquinone-7 (VITAMIN K2 PO) Take 1 tablet by mouth daily.     NON FORMULARY Take 1,000 mcg by mouth daily. Methyl Folate chewable tablet     NONFORMULARY OR COMPOUNDED ITEM Apply 2 drops topically in the morning and at bedtime. Estradiol-Progesterone 0.06-5-0.33 MG liquid drops      No current facility-administered medications for this visit.     Past Medical History:  Diagnosis Date   Allergic rhinitis    Allergy    Aneurysm of cardiac wall, congenital    a.  septal aneurysm extending into the RVOT (by cardiac MRI in 08/2013)   Anxiety    Aortic atherosclerosis (HCC)    Arthritis    Asthma    Basal cell carcinoma    Depression    Heart murmur    Hypothyroidism    Leukopenia    idiopathic   Lyme disease    Mitral valve prolapse    MVP (mitral valve prolapse)    Orthostatic hypotension  Osteoporosis    Pancreatic cyst    Peripheral neuropathy 06/09/2012   Pernicious anemia    Pernicious anemia    Pernicious anemia    Plantar fasciitis    Pneumothorax     Past Surgical History:  Procedure Laterality Date   COLONOSCOPY WITH PROPOFOL N/A 02/22/2013   Procedure: COLONOSCOPY WITH PROPOFOL;  Surgeon: Garlan Fair, MD;  Location: [facility];  Service: Endoscopy;  Laterality: N/A;   MOUTH SURGERY  01/2012   bone graft in mouth   NASAL SINUS SURGERY Right 06/12/2014   Procedure: RIGHT ENDOSCOPIC MAXILLARY ANTROSTOMY;  Surgeon: Ascencion Dike, MD;  Location: [facility];  Service: ENT;   Laterality: Right;   ROOT CANAL  02/27/2012   TONSILLECTOMY  1962    Social History   Socioeconomic History   Marital status: Divorced    Spouse name: Not on file   Number of children: 0   Years of education: Not on file   Highest education level: Not on file  Occupational History    Comment: Career and life coach  Tobacco Use   Smoking status: Never Smoker   Smokeless tobacco: Never Used  Scientific laboratory technician Use: Never used  Substance and Sexual Activity   Alcohol use: No    Alcohol/week: 0.0 standard drinks   Drug use: No   Sexual activity: Not on file  Other Topics Concern   Not on file  Social History Narrative   Patient is single and lives alone.   Patient is self-employed, career and life coaching.   Patient drinks two to four cups of caffeine daily.   Social Determinants of Health   Financial Resource Strain:    Difficulty of Paying Living Expenses: Not on file  Food Insecurity:    Worried About Charity fundraiser in the Last Year: Not on file   [employer] of Food in the Last Year: Not on file  Transportation Needs:    Lack of Transportation (Medical): Not on file   Lack of Transportation (Non-Medical): Not on file  Physical Activity:    Days of Exercise per Week: Not on file   Minutes of Exercise per Session: Not on file  Stress:    Feeling of Stress : Not on file  Social Connections:    Frequency of Communication with Friends and Family: Not on file   Frequency of Social Gatherings with Friends and Family: Not on file   Attends Religious Services: Not on file   Active Member of Clubs or Organizations: Not on file   Attends Archivist Meetings: Not on file   Marital Status: Not on file  Intimate Partner Violence:    Fear of Current or Ex-Partner: Not on file   Emotionally Abused: Not on file   Physically Abused: Not on file   Sexually Abused: Not on file    Family History  Problem Relation Age of Onset    Hyperlipidemia Father    Heart failure Father    Prostate cancer Father    Kidney failure Father    Hypertension Father    Angina Mother    Hypertension Mother    Lung cancer Mother    Colon cancer Mother    Lung cancer Paternal Grandfather    Breast cancer Paternal Grandmother    Diabetes Paternal Grandmother    Esophageal cancer Neg Hx    Liver cancer Neg Hx    Pancreatic cancer Neg Hx    Rectal cancer  Neg Hx    Stomach cancer Neg Hx     ROS: no fevers or chills, productive cough, hemoptysis, dysphasia, odynophagia, melena, hematochezia, dysuria, hematuria, rash, seizure activity, orthopnea, PND, pedal edema, claudication. Remaining systems are negative.  Physical Exam: Well-developed well-nourished in no acute distress.  Skin is warm and dry.  HEENT is normal.  Neck is supple.  Chest is clear to auscultation with normal expansion.  Cardiovascular exam is regular rate and rhythm.  Abdominal exam nontender or distended. No masses palpated. Extremities show no edema. neuro grossly intact  A/P  1 history of chest pain-occasional vague symptoms.  Previous calcium score 0.  No plans for further ischemia evaluation.  2 palpitations-no significant arrhythmia on recent monitor.  3 hyperlipidemia-Her previous calcium score was 0 though she does have history of aortic atherosclerosis.  She would like to continue diet for now.  We will recheck lipids in 6 months to reassess.  4 aneurysmal dilatation of the interventricular septum extending into the right ventricular outflow tract-no VSD or sinus of Valsalva noted on previous studies.  5 orthostasis-no recent symptoms.  Continue increased fluid intake as well as sodium intake.  6 pancreatic cystic lesion-followed by primary care.  Kirk Ruths, MD

## 2020-07-09 ENCOUNTER — Other Ambulatory Visit: Payer: Self-pay

## 2020-07-09 ENCOUNTER — Encounter: Payer: Self-pay | Admitting: Cardiology

## 2020-07-09 ENCOUNTER — Ambulatory Visit (INDEPENDENT_AMBULATORY_CARE_PROVIDER_SITE_OTHER): Payer: Medicare Other | Admitting: Cardiology

## 2020-07-09 VITALS — BP 112/78 | HR 74 | Ht 69.0 in | Wt 127.0 lb

## 2020-07-09 DIAGNOSIS — R072 Precordial pain: Secondary | ICD-10-CM

## 2020-07-09 DIAGNOSIS — I951 Orthostatic hypotension: Secondary | ICD-10-CM

## 2020-07-09 DIAGNOSIS — R002 Palpitations: Secondary | ICD-10-CM

## 2020-07-09 DIAGNOSIS — E7800 Pure hypercholesterolemia, unspecified: Secondary | ICD-10-CM

## 2020-07-09 NOTE — Patient Instructions (Signed)
  Lab Work: Your physician recommends that you return for lab work in: [place] = FASTING  If you have labs (blood work) drawn today and your tests are completely normal, you will receive your results only by: [name] MyChart Message (if you have MyChart) OR . A paper copy in the mail If you have any lab test that is abnormal or we need to change your treatment, we will call you to review the results.  Follow-Up: At [facility], you and your health needs are our priority.  As part of our continuing mission to provide you with exceptional heart care, we have created designated Provider Care Teams.  These Care Teams include your primary Cardiologist (physician) and Advanced Practice Providers (APPs -  Physician Assistants and Nurse Practitioners) who all work together to provide you with the care you need, when you need it.  We recommend signing up for the patient portal called "MyChart".  Sign up information is provided on this After Visit Summary.  MyChart is used to connect with patients for Virtual Visits (Telemedicine).  Patients are able to view lab/test results, encounter notes, upcoming appointments, etc.  Non-urgent messages can be sent to your provider as well.   To learn more about what you can do with MyChart, go to [email or web address].    Your next appointment:   6 month(s)  The format for your next appointment:   In Person  Provider:   Kirk Ruths, MD

## 2020-07-11 ENCOUNTER — Ambulatory Visit: Payer: Medicare Other | Admitting: Psychiatry

## 2020-07-12 ENCOUNTER — Other Ambulatory Visit: Payer: Self-pay

## 2020-07-12 ENCOUNTER — Ambulatory Visit (INDEPENDENT_AMBULATORY_CARE_PROVIDER_SITE_OTHER): Payer: Medicare Other | Admitting: Internal Medicine

## 2020-07-12 ENCOUNTER — Encounter: Payer: Self-pay | Admitting: Internal Medicine

## 2020-07-12 VITALS — BP 110/75 | HR 70 | Ht 69.0 in | Wt 127.6 lb

## 2020-07-12 DIAGNOSIS — E039 Hypothyroidism, unspecified: Secondary | ICD-10-CM | POA: Diagnosis not present

## 2020-07-12 DIAGNOSIS — M810 Age-related osteoporosis without current pathological fracture: Secondary | ICD-10-CM | POA: Diagnosis not present

## 2020-07-12 DIAGNOSIS — E162 Hypoglycemia, unspecified: Secondary | ICD-10-CM | POA: Diagnosis not present

## 2020-07-12 LAB — TSH: TSH: 1.65 u[IU]/mL (ref 0.35–4.50)

## 2020-07-12 LAB — T3, FREE: T3, Free: 6.1 pg/mL — ABNORMAL HIGH (ref 2.3–4.2)

## 2020-07-12 LAB — POCT GLYCOSYLATED HEMOGLOBIN (HGB A1C): Hemoglobin A1C: 4.9 % (ref 4.0–5.6)

## 2020-07-12 LAB — T4, FREE: Free T4: 0.84 ng/dL (ref 0.60–1.60)

## 2020-07-12 NOTE — Patient Instructions (Addendum)
Please continue Armour 30 mg 5/7 days and 15 mg 2/7 days.  Take the thyroid hormone every day, with water, at least 30 minutes before breakfast, separated by at least 4 hours from: - acid reflux medications - calcium - iron - multivitamins  Please schedule an appt with with nutrition.  Please come back for a follow-up appointment in 8 months.

## 2020-07-12 NOTE — Progress Notes (Signed)
Patient ID: Megan [name], female   DOB: [birth year], 64 y.o.   MRN: [id number]   This visit occurred during the SARS-CoV-2 public health emergency.  Safety protocols were in place, including screening questions prior to the visit, additional usage of staff PPE, and extensive cleaning of exam room while observing appropriate contact time as indicated for disinfecting solutions.   HPI  Megan [name] is a 64 y.o.-year-old femalemale, initially referred by her OB/GYN doctor, Dr. Ronita Hipps, returning for follow-up for osteoporosis and hypothyroidism.  Last visit 4 months ago.  She returns earlier than the recommended interval after an episode of hypoglycemia with glucose decreased to 45 few days ago in the setting of decreased food intake.  She describes that she had dental surgery for a longstanding infection and could only eat soft foods for a period of 3 weeks.  She also cut down carbs after she was found to have a high cholesterol level.  She started to feel poorly with dizziness and flushing and finally presented to the emergency room 07/03/2020.  After being given glucose, sugars improved.  At home, she continues to monitor her blood sugars consistently and she did not have blood sugars lower than 62 since then.  Reviewing her detailed diet and blood sugar records, they range between 62 (although was, before lunch) to 117.   She continues to see multiple specialists.  However, she tells me that she will not return to see her integrative medicine provider for now and would like me to manage her thyroid disease.  She was diagnosed with osteoporosis in 2006, but she had lower BMD even before menopause in 2004.  She continues to follow-up with a bone specialist in [place] but would also want to continue to follow with me for this problem.  Reviewed available DXA scan reports: Date L1-L4 T score FN T score 33% distal Radius  11/09/2019 Baldo Ash, [place]) -3.5 (-0.4%) RFN: -2.4  LFN: -2.4 n/a  11/03/2018  Baldo Ash, [place]) -3.5 (-2.8%) RFN: -2.3 LFN: -2.1  n/a  11/10/2017 Baldo Ash, [place]) -2.3 RFN: n/a LFN: n/a n/a  08/13/2017 -3.3 (-4.4%*) RFN: -2.0 LFN: -1.8 n/a  08/10/2015 -3.0 RFN: -2.0 LFN: -2.2 n/a   Per records brought by patient: L1-L4:  06/2013: -3.5  10/2007: -3.1  07/2005: -2.5  05/2003: -2.2  RFN:  06/2013: -2.2  10/2007: -2.0  07/2005: -2.1  05/2003: -1.8  LFN:  06/2013: -2.2  10/2007: -2.1  07/2005: -2.2  05/2003: -1.9  She had 1 fracture: - 07/2016: Right rib  No recent falls or fractures.  Reviewed previous osteoporosis treatments: - Fosamax and Actonel - 2001-2004 (no help) - Estradiol 0.0125 mg + Progesterone - 2014-2016 (improvement in spine BMD) >> she  just started a combination of Estradiol-Prometrium-Testosterone 0.06-5-0.33 mg/gtt - 4 drops per day  No history of vitamin D deficiency.  Reviewed previous vitamin D levels: Lab Results  Component Value Date   VD25OH 63.8 02/29/2020  07/14/2019: Vitamin D 57 01/27/2019: Vitamin D 50.7-on 5000 units vitamin D daily 03/29/2018: Vitamin D 82.5 09/2017: Vitamin D 72 06/17/2016: Vitamin D 35  On 5000 units vitamin D daily.  Also taking vitamin K2.  She continues to exercise consistently.  She tried [place] >> got hurt - also had a HA that lasted 3 weeks.  She retried this earlier in 2021 and she again hurt herself and developed a headache.  No history of repeated steroid courses.  In 2018, she ruled out for multiple myeloma by protein electrophoresis.  She has  polyclonal gammopathy and follows with hematology.  She was also investigated by nephrology for proteinuria.  Menopause was at 64 years old.  Pt does have a FH of osteoporosis: M - had spinal fx's, father - Lupron.  She was seeing Dr. Knox Saliva >> then switched to another functional medicine doctor. She was told she had mold in her house, low WBC, tested positive for heavy metals, iron is low (she had a recent low ferritin  level, also - 01/2019).    No history of kidney stones, hyper or hypocalcemia or hyperparathyroidism: Lab Results  Component Value Date   CALCIUM 9.5 07/01/2020   CALCIUM 9.6 05/29/2020   CALCIUM 9.3 12/20/2019   CALCIUM 9.5 07/20/2019   CALCIUM 9.5 07/16/2019   CALCIUM 9.7 03/03/2019   CALCIUM 9.2 08/22/2018   CALCIUM 9.4 03/20/2018   CALCIUM 9.2 01/19/2018   CALCIUM 9.2 12/30/2017  02/03/2019: calcium 9.7 03/29/2018: PTH 48  No history of CKD.  Latest BUN/creatinine: Lab Results  Component Value Date   BUN 14 07/01/2020   CREATININE 0.74 07/01/2020   Hypothyroidism.  She was initially on Nature-Throid, then she had to switch to Armour due to [employer].  She noticed some hair loss after switching, but no other symptoms.  In 2019, she was taken off the medication by one of her providers but she felt terrible and restarted.  Reviewed her TFTs: Lab Results  Component Value Date   TSH 1.96 02/29/2020  01/27/2019: TSH: 2.21 Lab Results  Component Value Date   TSH 2.78 06/30/2018  03/29/2018: TSH 3.17, free T4 0.75, free T3 3.0 02/10/2018: TSH 1.89, free T4 0.9, free T3 2.2 11/04/2017: TSH 2.74, free T4 0.59, free T3 5.2 08/14/2017: TSH 1.780, fT4 1.08 - pt was on Biotin 10,000 mcg when labs drawn; Selenium and Urinary iodine normal 07/19/2008: TSH 6.76  She is on Armour 30 mg 5/7 days and 15 mg 2/7 days, reduced 03/2020 by her Integrative Med Provider - in am - fasting - at least 30 min from b'fast and coffee with almond milk - no calcium - no iron - no multivitamins - no PPIs - not on Biotin (stopped)  She is on multiple other supplements, including alpha-lipoic acid, co-Q10, 5 HTP, vitamin B12, probiotics, magnesium, etc. We stopped her vitamin A since this was found to exacerbate osteoporosis. She stopped the supplement and also her multivitamins due to elevated B6 vitamin level.  At last check, on 02/10/2018, her vitamin B6 was 10.9 (2.0-32.8), normal. In 2019,  she  went to the emergency room for chest pain after she started a natural supplement (nitric balance-ATP), which she stopped since.  Elevated estradiol:  Reviewed history: She had previously undetectable estradiol levels in 07/25/2013, 08/21/2014, 05/05/2016, 06/03/2016, however, in 04/25/2017, her level was 44.3 pg/mL.  At that time, she was giving estriol to her dog without gloves.  She started to use gloves and her level became undetectable again.  She then started to reuse the same gloves and in 03/23/2018, the level was 197.8 pg/mL.  She started to use gloves only once for administration and her level decreased to 101.8 on 03/29/2018.  She was very worried about this despite reassurance that the pattern of estrogen increase was consistent with interference with the assay rather than a tumoral source. She had imaging tests that initially showed an ovarian cyst which resolved.  The elevated estrogen was deemed to be due to interference with the assay from her high-dose biotin.  She has a history  of pernicious anemia and IDA.  She got iron infusions:  Ferritin 17 >> 50.  She has a stable 8 mm pancreatic cyst-no intervention needed, only repeat MRI for follow-up.  She continues to have chronic fatigue.  She saw infectious disease to come for chronic Lyme disease.  ROS: Constitutional: no weight gain/no weight loss, no fatigue, no subjective hyperthermia, no subjective hypothermia Eyes: no blurry vision, no xerophthalmia ENT: no sore throat, no nodules palpated in neck, no dysphagia, no odynophagia, no hoarseness Cardiovascular: no CP/no SOB/no palpitations/no leg swelling Respiratory: no cough/no SOB/no wheezing Gastrointestinal: no N/no V/no D/no C/no acid reflux Musculoskeletal: no muscle aches/no joint aches Skin: no rashes, no hair loss Neurological: no tremors/no numbness/no tingling/no dizziness  I reviewed pt's medications, allergies, PMH, social hx, family hx, and changes were  documented in the history of present illness. Otherwise, unchanged from my initial visit note.  Past Medical History:  Diagnosis Date  . Allergic rhinitis   . Allergy   . Aneurysm of cardiac wall, congenital    a.  septal aneurysm extending into the RVOT (by cardiac MRI in 08/2013)  . Anxiety   . Aortic atherosclerosis ([place])   . Arthritis   . Asthma   . Basal cell carcinoma   . Depression   . Heart murmur   . Hypothyroidism   . Leukopenia    idiopathic  . Lyme disease   . Mitral valve prolapse   . MVP (mitral valve prolapse)   . Orthostatic hypotension   . Osteoporosis   . Pancreatic cyst   . Peripheral neuropathy 06/09/2012  . Pernicious anemia   . Pernicious anemia   . Pernicious anemia   . Plantar fasciitis   . Pneumothorax    Past Surgical History:  Procedure Laterality Date  . COLONOSCOPY WITH PROPOFOL N/A 02/22/2013   Procedure: COLONOSCOPY WITH PROPOFOL;  Surgeon: Garlan Fair, MD;  Location: [facility];  Service: Endoscopy;  Laterality: N/A;  . MOUTH SURGERY  01/2012   bone graft in mouth  . NASAL SINUS SURGERY Right 06/12/2014   Procedure: RIGHT ENDOSCOPIC MAXILLARY ANTROSTOMY;  Surgeon: Ascencion Dike, MD;  Location: [facility];  Service: ENT;  Laterality: Right;  . ROOT CANAL  02/27/2012  . TONSILLECTOMY  1962   Social History   Socioeconomic History  . Marital status: Divorced    Spouse name: Not on file  . Number of children: 0  . Years of education: Not on file  . Highest education level: Not on file  Occupational History    Comment: Career and life coach  Tobacco Use  . Smoking status: Never Smoker  . Smokeless tobacco: Never Used  Vaping Use  . Vaping Use: Never used  Substance and Sexual Activity  . Alcohol use: No    Alcohol/week: 0.0 standard drinks  . Drug use: No  . Sexual activity: Not on file  Other Topics Concern  . Not on file  Social History Narrative   Patient is single and lives alone.   Patient is  self-employed, career and life coaching.   Patient drinks two to four cups of caffeine daily.   Social Determinants of Health   Financial Resource Strain:   . Difficulty of Paying Living Expenses: Not on file  Food Insecurity:   . Worried About Charity fundraiser in the Last Year: Not on file  . Ran Out of Food in the Last Year: Not on file  Transportation Needs:   . Lack  of Transportation (Medical): Not on file  . Lack of Transportation (Non-Medical): Not on file  Physical Activity:   . Days of Exercise per Week: Not on file  . Minutes of Exercise per Session: Not on file  Stress:   . Feeling of Stress : Not on file  Social Connections:   . Frequency of Communication with Friends and Family: Not on file  . Frequency of Social Gatherings with Friends and Family: Not on file  . Attends Religious Services: Not on file  . Active Member of Clubs or Organizations: Not on file  . Attends Archivist Meetings: Not on file  . Marital Status: Not on file  Intimate Partner Violence:   . Fear of Current or Ex-Partner: Not on file  . Emotionally Abused: Not on file  . Physically Abused: Not on file  . Sexually Abused: Not on file   Current Outpatient Medications on File Prior to Visit  Medication Sig Dispense Refill  . 5-Hydroxytryptophan (5-HTP) 100 MG CAPS Take 100 mg by mouth daily.     [name] acetaminophen (TYLENOL) 500 MG tablet Take 250 mg by mouth every 6 (six) hours as needed for headache.    . Acetylcysteine (NAC) 500 MG CAPS Take 500 mg by mouth daily.    [name] albuterol (VENTOLIN HFA) 108 (90 Base) MCG/ACT inhaler Inhale 1 puff into the lungs daily as needed for wheezing or shortness of breath.     . Alpha-Lipoic Acid 300 MG CAPS Take 300 mg by mouth daily.    Francia Greaves THYROID 30 MG tablet Take 30 mg by mouth daily.  6  . Ascorbic Acid (VITAMIN C PO) Take 2,500 Units by mouth daily.     . chlorhexidine (PERIDEX) 0.12 % solution Use as directed 15 mLs in the mouth or throat 2  (two) times daily.    . Cholecalciferol (VITAMIN D3) 10000 units capsule Take 10,000 Units by mouth daily.     . clonazePAM (KLONOPIN) 1 MG tablet Take 1 mg by mouth daily as needed for anxiety.     . Cyanocobalamin (VITAMIN B-12 ER PO) Take 5,000 mcg by mouth daily.     [name] MAGNESIUM CITRATE PO Take 800 mg by mouth daily.     . Menaquinone-7 (VITAMIN K2 PO) Take 1 tablet by mouth daily.    . NON FORMULARY Take 1,000 mcg by mouth daily. Methyl Folate chewable tablet    . NONFORMULARY OR COMPOUNDED ITEM Apply 2 drops topically in the morning and at bedtime. Estradiol-Progesterone 0.06-5-0.33 MG liquid drops      No current facility-administered medications on file prior to visit.   Allergies  Allergen Reactions  . Augmentin [Amoxicillin-Pot Clavulanate] Diarrhea  . Clindamycin/Lincomycin Diarrhea   Family History  Problem Relation Age of Onset  . Hyperlipidemia Father   . Heart failure Father   . Prostate cancer Father   . Kidney failure Father   . Hypertension Father   . Angina Mother   . Hypertension Mother   . Lung cancer Mother   . Colon cancer Mother   . Lung cancer Paternal Grandfather   . Breast cancer Paternal Grandmother   . Diabetes Paternal Grandmother   . Esophageal cancer Neg Hx   . Liver cancer Neg Hx   . Pancreatic cancer Neg Hx   . Rectal cancer Neg Hx   . Stomach cancer Neg Hx     PE: BP 110/75   Pulse 70   Ht '5\' 9"'  (1.753 m)  Wt 127 lb 9.6 oz (57.9 kg)   SpO2 98%   BMI 18.84 kg/m  Wt Readings from Last 3 Encounters:  07/12/20 127 lb 9.6 oz (57.9 kg)  07/09/20 127 lb (57.6 kg)  06/05/20 125 lb 12.8 oz (57.1 kg)   Constitutional: normal weight, in NAD Eyes: PERRLA, EOMI, no exophthalmos ENT: moist mucous membranes, no thyromegaly, no cervical lymphadenopathy Cardiovascular: RRR, No MRG Respiratory: CTA B Gastrointestinal: abdomen soft, NT, ND, BS+ Musculoskeletal: no deformities, strength intact in all 4 Skin: moist, warm, no  rashes Neurological: no tremor with outstretched hands, DTR normal in all 4  Assessment: 1. Osteoporosis  2.  Hypothyroidism  3.  Elevated estrogen -This was due to biotin interference with the assay -No further follow-up is needed for this  Other treating physicians:  Dr. Ronita Hipps  Dr. August Luz [facility] [place] [address]  Plan: 1. Osteoporosis -Likely H-related/postmenopausal + she also has family history of osteoporosis.  She has a history of drinking a lot of coffee before. -No fractures since last -Reviewed her DXA scans from 2018, 2020, 11/09/2019-stable at the spine, however, decreased T-scores at the right and left femoral neck on the last bone density  -At every visit, we discussed about options for treatment.  She refused antiresorptive medication and bone anabolic agents.  Her bone specialist advised her to consider Evenity-we discussed about this at last visit.  I advised her that this is usually a last resort for treatment and would not use this before trying Tymlos/Forteo or an antiresorptive.  She decided not to try it. -She is on many supplements for bone maintenance.  We discussed at last visit that I would not recommend estrogen, no more than in his abdomen was due to the risk of thrombosis and breast cancer, however, this is managed by her integrative medicine provider.  In the past, we stopped vitamin A since in excess of this can lead to osteoporosis  -She did try to go to the [place] [facility] but she developed muscle aches and headaches and stopped.  She retried this with the same results earlier this year. -In the past, we discussed about improving exercise: Moving this after meals, rather than before meals; walking more downhill compared to left heel; using weights (backpack, and weights when walking, etc. she continues to walk 3 to 5 miles a day.  She restarted to go to the gym and also does balancing exercises. -She continues to see  an osteoporosis specialist who orders her bone density scans -At last visit, we decided to continue with her HRT supplement (a compounded hormonal supplement including estrogen, testosterone, progesterone - she feels much better on the supplement), vibration platform and weightbearing and balance exercises and recheck the bone density in a year. -Recent vitamin D level was normal; we will not repeat this today -I will see her back in 8 months.  2. Hypothyroidism -She was started on thyroid hormones based on symptoms, without a clear history of hypothyroidism.  Since her TFTs remains normal afterwards, we continued her low-dose supplementation. -One of the other specialists she is seeing wanted to take her off thyroid hormones but she felt poorly and restarted -At last visit, she was on 30 mg of Armour daily and her TSH was normal: Lab Results  Component Value Date   TSH 1.96 02/29/2020  -However, she tells me that based on these labs, her integrative medicine specialist decrease the dose to 30 mg 5/7 days and 15 mg 2/7  days... However, she tells me that she is now planning to return to see her alternative medicine physician.  In that case, we will recheck her TFTs today and adjust the Armour dose accordingly. - we discussed about taking the thyroid hormone every day, with water, >30 minutes before breakfast, separated by >4 hours from acid reflux medications, calcium, iron, multivitamins. Pt. is taking it correctly.  3.  Hypoglycemia -She had a low blood sugar 45 on 07/03/2020 after a 3-week.  When she could not eat well after periodontal surgery.  She tells me that she is usually eating a plant-based diet and she was not able to eat vegetables and only pured or liquid fights foods.  We discussed that after 3-week of decrease intake, it is normal to develop hypoglycemia, and I do not feel that this is related to any pathology.  Improving her diet to get more carbs and nutrients should definitely  help.  Upon her questioning, I reassured her that her pancreatic cyst does not appear to have been related to her recent hypoglycemia episode.  Since she is confused about what she can eat especially after her dental surgery (for example she tells me that she cannot eat salads for 6 months after the surgery), I suggested a referral to nutrition >> she agrees. -She is wondering how frequently she needs to check her blood sugars at home from now on and whether she needs to get a continuous glucose monitoring device.  I advised her against both.  I explained why this is not recommended.  She does have anxiety related to checking blood sugars and such intense monitoring in somebody with does not have prediabetes or diabetes would accentuates this.  Also, CGM values are not correlating well with hypoglycemia symptoms and the readings may not be very accurate at lower-normal blood sugars. -We checked an HbA1c today >> 4.9% -No other intervention needed for now  - Total time spent for the visit: 40 min, in obtaining medical information from the chart and from the patient, reviewing her  previous labs, blood sugar and diet log, imaging evaluations, and treatments, reviewing her symptoms, counseling her about her conditions (please see the discussed topics above), and developing a plan to further investigate and treat them; she had a number of questions which I addressed.  Component     Latest Ref Rng & Units 07/12/2020  Triiodothyronine,Free,Serum     2.3 - 4.2 pg/mL 6.1 (H)  T4,Free(Direct)     0.60 - 1.60 ng/dL 0.84  TSH     0.35 - 4.50 uIU/mL 1.65  Hemoglobin A1C     4.0 - 5.6 % 4.9   Normal labs, except slightly high free T3, as expected in the setting of Armour Thyroid.  We will continue the current dose of Armour.  Philemon Kingdom, MD PhD [facility] Endocrin[facility]

## 2020-07-13 ENCOUNTER — Encounter: Payer: Self-pay | Admitting: Internal Medicine

## 2020-07-23 ENCOUNTER — Ambulatory Visit: Payer: Medicare Other | Admitting: Cardiology

## 2020-07-23 ENCOUNTER — Ambulatory Visit: Payer: Medicare Other | Admitting: Psychiatry

## 2020-08-07 ENCOUNTER — Ambulatory Visit (INDEPENDENT_AMBULATORY_CARE_PROVIDER_SITE_OTHER): Payer: Medicare Other | Admitting: Psychiatry

## 2020-08-07 ENCOUNTER — Other Ambulatory Visit: Payer: Self-pay

## 2020-08-07 DIAGNOSIS — F341 Dysthymic disorder: Secondary | ICD-10-CM

## 2020-08-14 ENCOUNTER — Other Ambulatory Visit: Payer: Self-pay

## 2020-08-14 DIAGNOSIS — L920 Granuloma annulare: Secondary | ICD-10-CM | POA: Insufficient documentation

## 2020-08-14 DIAGNOSIS — M810 Age-related osteoporosis without current pathological fracture: Secondary | ICD-10-CM | POA: Insufficient documentation

## 2020-08-14 DIAGNOSIS — E162 Hypoglycemia, unspecified: Secondary | ICD-10-CM | POA: Insufficient documentation

## 2020-08-14 DIAGNOSIS — M9270 Juvenile osteochondrosis of metatarsus, unspecified foot: Secondary | ICD-10-CM

## 2020-08-14 DIAGNOSIS — I700 Atherosclerosis of aorta: Secondary | ICD-10-CM | POA: Insufficient documentation

## 2020-08-14 DIAGNOSIS — Q383 Other congenital malformations of tongue: Secondary | ICD-10-CM | POA: Insufficient documentation

## 2020-08-14 DIAGNOSIS — R209 Unspecified disturbances of skin sensation: Secondary | ICD-10-CM | POA: Insufficient documentation

## 2020-08-14 DIAGNOSIS — C4490 Unspecified malignant neoplasm of skin, unspecified: Secondary | ICD-10-CM | POA: Insufficient documentation

## 2020-08-14 DIAGNOSIS — L718 Other rosacea: Secondary | ICD-10-CM | POA: Insufficient documentation

## 2020-08-14 DIAGNOSIS — R42 Dizziness and giddiness: Secondary | ICD-10-CM | POA: Insufficient documentation

## 2020-08-14 DIAGNOSIS — F411 Generalized anxiety disorder: Secondary | ICD-10-CM | POA: Insufficient documentation

## 2020-08-14 DIAGNOSIS — M19072 Primary osteoarthritis, left ankle and foot: Secondary | ICD-10-CM | POA: Insufficient documentation

## 2020-08-14 DIAGNOSIS — E7800 Pure hypercholesterolemia, unspecified: Secondary | ICD-10-CM | POA: Insufficient documentation

## 2020-08-14 DIAGNOSIS — Z8719 Personal history of other diseases of the digestive system: Secondary | ICD-10-CM | POA: Insufficient documentation

## 2020-08-14 DIAGNOSIS — Q2549 Other congenital malformations of aorta: Secondary | ICD-10-CM | POA: Insufficient documentation

## 2020-08-16 ENCOUNTER — Encounter: Payer: Self-pay | Admitting: Internal Medicine

## 2020-08-27 ENCOUNTER — Other Ambulatory Visit: Payer: Self-pay

## 2020-08-27 ENCOUNTER — Encounter: Payer: Self-pay | Admitting: Internal Medicine

## 2020-08-27 ENCOUNTER — Ambulatory Visit: Payer: Medicare Other | Admitting: Neurology

## 2020-08-27 ENCOUNTER — Ambulatory Visit (INDEPENDENT_AMBULATORY_CARE_PROVIDER_SITE_OTHER): Payer: Medicare Other | Admitting: Internal Medicine

## 2020-08-27 VITALS — BP 109/62 | HR 73 | Ht 69.25 in | Wt 127.0 lb

## 2020-08-27 DIAGNOSIS — M810 Age-related osteoporosis without current pathological fracture: Secondary | ICD-10-CM

## 2020-08-27 DIAGNOSIS — E162 Hypoglycemia, unspecified: Secondary | ICD-10-CM

## 2020-08-27 DIAGNOSIS — E039 Hypothyroidism, unspecified: Secondary | ICD-10-CM

## 2020-08-27 NOTE — Progress Notes (Signed)
Patient ID: Megan [name], female   DOB: 11/10/1955, 65 y.o.   MRN: [id number]   This visit occurred during the SARS-CoV-2 public health emergency.  Safety protocols were in place, including screening questions prior to the visit, additional usage of staff PPE, and extensive cleaning of exam room while observing appropriate contact time as indicated for disinfecting solutions.   HPI  Megan [name] is a 65 y.o.-year-old female, initially referred by her OB/GYN doctor, Dr. Ronita Hipps, returning for follow-up for osteoporosis and hypothyroidism.  Last visit 1.5 months ago.  She returns earlier than the recommended interval as she feels that her sugars continue to drop too low.  At last visit, she described that she had dental surgery for a longstanding infection and could only eat soft foods for a period of 3 weeks.  She also cut down carbs after she was found to have a high cholesterol level.  She started to feel poorly with dizziness and flushing and finally presented to the emergency room 07/03/2020.  After being given glucose, sugars improved.  At home, she continued to monitor her blood sugars consistently and she did not have blood sugars lower than 62.at last visit, reviewing her detailed diet and blood sugar records, they range between 62 (although was, before lunch) to 117.  I reassured her that this was not abnormal.  I communicated with her through [place] since last visit reassuring her about her blood sugars.  However, she sent me the following message several days ago: August 23, 2020 [name], Megan [name] "Megan [name]" to Me    1:49 PM I am so sorry to bother you but I am not feeling well and am a little scared - I was able to get an appointment with you for Monday, 1/3 but if you have any suggestions for me over the weekend, I would appreciate it.  My blood sugar at 5AM when I woke up was 112 (I ate well yesterday and had oatmeal with cinnamon at 7PM which was my last food of the day).  I wasn't  hungry so didn't eat breakfast which is not unusual for me and at noon it was 61.  I made his a large smoothie (cranberries, avocado, banana, greens, 2 cups almond milk, protein powder) and at 1:30PM, my sugar is 58 and I am dizzy and my eyes are off.  I just drank 3/4 of a coke.  I am eating really well and don't understand why it is high when I wake up and so low 1.5 hours after eating lunch.  I am being really careful to not eat chocolate covered things (which I sometimes enjoy) and higher fat things like almond butter, but this "clean" diet seems to get my sugar really low.  If you see this before Monday and have any suggestions outside of coke or sugar pills, I would appreciate it.  Thanks!  Megan [name]  In the past, she was seen multiple specialists.  At last visit she was telling me that she would not return to see her integrative medicine provider and would like me to manage her thyroid disease, also.  She was diagnosed with osteoporosis in 2006, but she had lower BMD even before menopause in 2004.  She continues to follow-up with a bone specialist in [place] but would also want to continue to follow-up with me for this problem.  Reviewed previous DEXA scans: Date L1-L4 T score FN T score 33% distal Radius  11/09/2019 Baldo Ash, [place]) -3.5 (-0.4%) RFN: -2.4  LFN: -  2.4 n/a  11/03/2018 Baldo Ash, [place]) -3.5 (-2.8%) RFN: -2.3 LFN: -2.1  n/a  11/10/2017 Baldo Ash, ) -2.3 RFN: n/a LFN: n/a n/a  08/13/2017 -3.3 (-4.4%*) RFN: -2.0 LFN: -1.8 n/a  08/10/2015 -3.0 RFN: -2.0 LFN: -2.2 n/a   Per records brought by patient: L1-L4:  06/2013: -3.5  10/2007: -3.1  07/2005: -2.5  05/2003: -2.2  RFN:  06/2013: -2.2  10/2007: -2.0  07/2005: -2.1  05/2003: -1.8  LFN:  06/2013: -2.2  10/2007: -2.1  07/2005: -2.2  05/2003: -1.9  She had 1 fracture: - 07/2016: Right rib  No recent falls or fractures:  Reviewed previous osteoporosis therapy: - Fosamax and Actonel - 2001-2004 (no  help) - Estradiol 0.0125 mg + Progesterone - 2014-2016 (improvement in spine BMD) >> she  just started a combination of Estradiol-Prometrium-Testosterone 0.06-5-0.33 mg/gtt - 4 drops per day  No history of vitamin D deficiency.  Reviewed previous vitamin D levels: Lab Results  Component Value Date   VD25OH 63.8 02/29/2020  07/14/2019: Vitamin D 57 01/27/2019: Vitamin D 50.7-on 5000 units vitamin D daily 03/29/2018: Vitamin D 82.5 09/2017: Vitamin D 72 06/17/2016: Vitamin D 35  She takes vitamin K2 +5000 units vitamin D daily.  She continues to exercise consistently.  She tried [place] >> got hurt - also had a HA that lasted 3 weeks.  She retried this earlier in 2021 and she again hurt herself and developed a headache.  No history of repeated steroid courses.  In 2018, she ruled out for multiple myeloma by protein electrophoresis.  She has polyclonal gammopathy and follows with hematology.  She was also investigated by nephrology for proteinuria.  Menopause was at 65 years old.  Pt does have a FH of osteoporosis: M - had spinal fx's, father - Lupron.  She was seeing Dr. Knox Saliva >> then switched to another functional medicine doctor. She was told she had mold in her house, low WBC, tested positive for heavy metals, iron is low (she had a recent low ferritin level, also - 01/2019).    No history of kidney stones, hyper or hypocalcemia or hyperparathyroidism: Lab Results  Component Value Date   CALCIUM 9.5 07/01/2020   CALCIUM 9.6 05/29/2020   CALCIUM 9.3 12/20/2019   CALCIUM 9.5 07/20/2019   CALCIUM 9.5 07/16/2019   CALCIUM 9.7 03/03/2019   CALCIUM 9.2 08/22/2018   CALCIUM 9.4 03/20/2018   CALCIUM 9.2 01/19/2018   CALCIUM 9.2 12/30/2017  02/03/2019: calcium 9.7 03/29/2018: PTH 48  No history of CKD.  Latest BUN/creatinine: Lab Results  Component Value Date   BUN 14 07/01/2020   CREATININE 0.74 07/01/2020   Hypothyroidism.  She was initially on Nature-Throid, then  she had to switch to Armour due to [employer].  She noticed some hair loss after switching, but no other symptoms.  In 2019, she was taken off the medication by one of her providers but she felt terrible and restarted.  Reviewed her TFTs: Lab Results  Component Value Date   TSH 1.65 07/12/2020  01/27/2019: TSH: 2.21 Lab Results  Component Value Date   TSH 2.78 06/30/2018  03/29/2018: TSH 3.17, free T4 0.75, free T3 3.0 02/10/2018: TSH 1.89, free T4 0.9, free T3 2.2 11/04/2017: TSH 2.74, free T4 0.59, free T3 5.2 08/14/2017: TSH 1.780, fT4 1.08 - pt was on Biotin 10,000 mcg when labs drawn; Selenium and Urinary iodine normal 07/19/2008: TSH 6.76  On Armour 30 mg 5/7 days and 15 mg 2/7 days, reduced 03/2020 by her Integrative Med  Provider - in am - fasting - at least 30 min from b'fast - no calcium - no iron - no multivitamins - no PPIs - not on Biotin  She is on multiple other supplements, including alpha-lipoic acid, co-Q10, 5 HTP, vitamin B12, probiotics, magnesium, etc. We stopped her vitamin A since this was found to exacerbate osteoporosis. She stopped the supplement and also her multivitamins due to elevated B6 vitamin level.  At last check, on 02/10/2018, her vitamin B6 was 10.9 (2.0-32.8), normal. In 2019, she  went to the emergency room for chest pain after she started a natural supplement (nitric balance-ATP), which she stopped since.  Elevated estradiol:  Reviewed history: She had previously undetectable estradiol levels in 07/25/2013, 08/21/2014, 05/05/2016, 06/03/2016, however, in 04/25/2017, her level was 44.3 pg/mL.  At that time, she was giving estriol to her dog without gloves.  She started to use gloves and her level became undetectable again.  She then started to reuse the same gloves and in 03/23/2018, the level was 197.8 pg/mL.  She started to use gloves only once for administration and her level decreased to 101.8 on 03/29/2018.  She was very worried about this  despite reassurance that the pattern of estrogen increase was consistent with interference with the assay rather than a tumoral source. She had imaging tests that initially showed an ovarian cyst which resolved.  The elevated estrogen was deemed to be due to interference with the assay from her high-dose biotin.  She has a history of pernicious anemia and IDA.  She got iron infusions:  Ferritin 17 >> 50.  She has a stable 8 mm pancreatic cyst-no intervention needed, only repeat MRI for follow-up.  She continues to have chronic fatigue.  She saw infectious disease to come for chronic Lyme disease.  ROS: Constitutional: no weight gain/no weight loss, no fatigue, no subjective hyperthermia, no subjective hypothermia Eyes: no blurry vision, no xerophthalmia ENT: no sore throat, no nodules palpated in neck, no dysphagia, no odynophagia, no hoarseness Cardiovascular: no CP/no SOB/no palpitations/no leg swelling Respiratory: no cough/no SOB/no wheezing Gastrointestinal: no N/no V/no D/no C/no acid reflux Musculoskeletal: no muscle aches/no joint aches Skin: no rashes, no hair loss Neurological: no tremors/no numbness/no tingling/no dizziness  I reviewed pt's medications, allergies, PMH, social hx, family hx, and changes were documented in the history of present illness. Otherwise, unchanged from my initial visit note.  Past Medical History:  Diagnosis Date  . Allergic rhinitis   . Allergy   . Aneurysm of cardiac wall, congenital    a.  septal aneurysm extending into the RVOT (by cardiac MRI in 08/2013)  . Anxiety   . Aortic atherosclerosis ([place])   . Arthritis   . Asthma   . Basal cell carcinoma   . Depression   . Heart murmur   . Hypothyroidism   . Leukopenia    idiopathic  . Lyme disease   . Mitral valve prolapse   . MVP (mitral valve prolapse)   . Orthostatic hypotension   . Osteoporosis   . Pancreatic cyst   . Peripheral neuropathy 06/09/2012  . Pernicious anemia   .  Pernicious anemia   . Pernicious anemia   . Plantar fasciitis   . Pneumothorax    Past Surgical History:  Procedure Laterality Date  . COLONOSCOPY WITH PROPOFOL N/A 02/22/2013   Procedure: COLONOSCOPY WITH PROPOFOL;  Surgeon: Garlan Fair, MD;  Location: [facility];  Service: Endoscopy;  Laterality: N/A;  . MOUTH SURGERY  01/2012  bone graft in mouth  . NASAL SINUS SURGERY Right 06/12/2014   Procedure: RIGHT ENDOSCOPIC MAXILLARY ANTROSTOMY;  Surgeon: Ascencion Dike, MD;  Location: [facility];  Service: ENT;  Laterality: Right;  . ROOT CANAL  02/27/2012  . TONSILLECTOMY  1962   Social History   Socioeconomic History  . Marital status: Divorced    Spouse name: Not on file  . Number of children: 0  . Years of education: Not on file  . Highest education level: Not on file  Occupational History    Comment: Career and life coach  Tobacco Use  . Smoking status: Never Smoker  . Smokeless tobacco: Never Used  Vaping Use  . Vaping Use: Never used  Substance and Sexual Activity  . Alcohol use: No    Alcohol/week: 0.0 standard drinks  . Drug use: No  . Sexual activity: Not on file  Other Topics Concern  . Not on file  Social History Narrative   Patient is single and lives alone.   Patient is self-employed, career and life coaching.   Patient drinks two to four cups of caffeine daily.   Social Determinants of Health   Financial Resource Strain: Not on file  Food Insecurity: Not on file  Transportation Needs: Not on file  Physical Activity: Not on file  Stress: Not on file  Social Connections: Not on file  Intimate Partner Violence: Not on file   Current Outpatient Medications on File Prior to Visit  Medication Sig Dispense Refill  . 5-Hydroxytryptophan (5-HTP) 100 MG CAPS Take 100 mg by mouth daily.     [name] albuterol (VENTOLIN HFA) 108 (90 Base) MCG/ACT inhaler Inhale 1 puff into the lungs daily as needed for wheezing or shortness of breath.     Francia Greaves  THYROID 30 MG tablet Take by mouth daily. Patient taking 30 MG 5 days a week and 15 MG 2 days.  6  . Ascorbic Acid (VITAMIN C PO) Take 2,500 Units by mouth daily.     . chlorhexidine (PERIDEX) 0.12 % solution Use as directed 15 mLs in the mouth or throat 2 (two) times daily.    . Cholecalciferol (VITAMIN D3) 10000 units capsule Take 10,000 Units by mouth daily.     . clonazePAM (KLONOPIN) 1 MG tablet Take 1 mg by mouth daily as needed for anxiety.     . Cyanocobalamin (VITAMIN B-12 ER PO) Take 5,000 mcg by mouth daily.     [name] MAGNESIUM CITRATE PO Take 800 mg by mouth daily.     . Menaquinone-7 (VITAMIN K2 PO) Take 1 tablet by mouth daily.    . NON FORMULARY Take 1,000 mcg by mouth daily. Methyl Folate chewable tablet    . NONFORMULARY OR COMPOUNDED ITEM Apply 2 drops topically in the morning and at bedtime. Estradiol-Progesterone 0.06-5-0.33 MG liquid drops      No current facility-administered medications on file prior to visit.   Allergies  Allergen Reactions  . Augmentin [Amoxicillin-Pot Clavulanate] Diarrhea  . Clindamycin/Lincomycin Diarrhea   Family History  Problem Relation Age of Onset  . Hyperlipidemia Father   . Heart failure Father   . Prostate cancer Father   . Kidney failure Father   . Hypertension Father   . Angina Mother   . Hypertension Mother   . Lung cancer Mother   . Colon cancer Mother   . Lung cancer Paternal Grandfather   . Breast cancer Paternal Grandmother   . Diabetes Paternal Grandmother   .  Esophageal cancer Neg Hx   . Liver cancer Neg Hx   . Pancreatic cancer Neg Hx   . Rectal cancer Neg Hx   . Stomach cancer Neg Hx     PE: BP 109/62   [name] 72   Ht 5' 9.25" (1.759 m)   Wt 127 lb (57.6 kg)   SpO2 98%   BMI 18.62 kg/m  Wt Readings from Last 3 Encounters:  08/27/20 127 lb (57.6 kg)  07/12/20 127 lb 9.6 oz (57.9 kg)  07/09/20 127 lb (57.6 kg)   Constitutional: normal weight, in NAD Eyes: PERRLA, EOMI, no exophthalmos ENT: moist mucous  membranes, no thyromegaly, no cervical lymphadenopathy Cardiovascular: RRR, No MRG Respiratory: CTA B Gastrointestinal: abdomen soft, NT, ND, BS+ Musculoskeletal: no deformities, strength intact in all 4 Skin: moist, warm, no rashes Neurological: no tremor with outstretched hands, DTR normal in all 4  Assessment: 1. Osteoporosis  2.  Hypothyroidism  3.  Elevated estrogen -This was due to biotin interference with the assay -No further follow-up is needed for this  Other treating physicians:  Dr. Ronita Hipps  Dr. August Luz [facility] [place] [address]  Plan: 1. Osteoporosis -Likely H-related/postmenopausal + she also has family history of osteoporosis.  She has a history of drinking a lot of coffee before. -No fractures or falls since last visit -Reviewed DXA scans from 2018, 2020, 11/09/2019-stable at the spine, however, decreased T-scores at the right and left femoral neck on the last bone density  -She refused antiresorptive medications and one anabolic agents.  Her bone specialist advised her to consider Evenity-we discussed about this at last visit.  I advised her that this is usually a last resort for treatment and would not use this before trying Tymlos/Forteo or an antiresorptive.  She decided not to try it. -She is on many supplements for bone maintenance, originally started by her integrative medicine provider.  In the past, we stopped vitamin A since excess of this can cause osteoporosis -She did try to go to the [facility] but she developed muscle aches and headaches and stopped.  She retried this with the same results earlier last year -We discussed at last visit about improving exercise: Moving this after meals, rather than before meals; walking more downhill compared to left heel; using weights (backpack, and weights when walking, etc. she continues to walk 3 to 5 miles daily -In the past, only decided to continue with her HRT supplement (a  compounded hormonal supplement including estrogen, testosterone, progesterone -she feels much better on the supplement), vibration platform and weightbearing and balance exercises. -Now that she got Medicare, she will probably need to wait 2 years before she can get another bone density scan -Recent vitamin D level was normal; we will not repeat this today -I will see her back in 6 months.  2. Hypothyroidism -She was started on thyroid hormones based on symptoms without a clear history of hypothyroidism.  Since her TFTs remains normal afterwards, we continued her supplementation. -One of the other specialists she is seeing wanted to take her off thyroid hormones but she felt poorly and restarted - latest thyroid labs reviewed with pt >> normal: Lab Results  Component Value Date   TSH 1.65 07/12/2020   - she continues on Armour 30 mg 5 out of 7 days and 15 mg 2 out of 7 days - pt feels good on this dose. - we discussed about taking the thyroid hormone every day, with water, >30  minutes before breakfast, separated by >4 hours from acid reflux medications, calcium, iron, multivitamins. Pt. is taking it correctly.  3.  Hypoglycemia -Probably with a component of idiopathic postprandial syndrome -Patient with a history of low blood sugars in the 06/2020 after about 3 week period when she could not eat well after periodontal surgery.  She is normally eating a plant-based diet and at that time she was not able to eat vegetables, only pured or liquid foods.  We discussed that the mild hypoglycemia could have been related to decreased intake and we discussed about increasing carbs and nutrients.  States she was confused about what she had to eat, I referred her to nutrition.  She did not have this appointment as it was very expensive (more than $400). -At last visit, she asked for advice about how frequently she needed to check her blood sugars at home or whether she should get the CGM.  I advised her  against getting the CGM, since this is not very accurate in visit and also can raise unnecessary anxiety about normal blood sugars.  I did advise her to check only when she is feeling poorly.  -At last visit HbA1c was 4.9% - at goal -She recently contacted me with again slightly lower blood sugars when driving, associated with weakness, and the low blood sugars did not correct even after drinking a smoothie.  They finally corrected after drinking Coca-Cola.  Upon questioning today, she tells me that the low blood sugars episodes appear after she skips breakfast, which is not unusual for her. -At this visit I suggested several dietary changes, including adding breakfast, staying with low glycemic load foods, eating fruit and veggies, not drinking liquids with meals, starting the meal with protein and fat and and with carbs and also carry a snack with her everywhere.  I also gave her written instruction about the 15-15 rule for hypoglycemia correction. -She is now spending a significant amount of time calculating exactly how many carbs, protein, and fat she is eating per day and also documenting her blood sugars checked multiple times a day and also her activity and diet.  I advised her that none of this is necessary.  I did advise her to check her blood sugars if she feels poorly, but not otherwise. -We also discussed about possible causes of blood sugars not always staying in the 70s or 80s in the morning and increase sensitivity to blood sugar fluctuations even in the absence of hypoglycemia (idiopathic postprandial syndrome)  - Total time spent for the visit: 40 min, in obtaining medical information from the patient and from the chart, reviewing her  previous labs, evaluations, and treatments, reviewing her symptoms, counseling her about her conditions (please see the discussed topics above), and developing a plan to further treat it; she had a number of questions which I addressed.   Philemon Kingdom,  MD PhD [facility] Endocrin[facility]

## 2020-08-27 NOTE — Patient Instructions (Addendum)
Please review the following website - stay with the lower glycemic load foods: Www.glycemicindex.com  Have 3 meals a day +/- 2-3 snacks.  Eat as much fruit and veggies as you can. Berries, pears, apples, peaches, apricots - are the best.  Do not drink liquids with a meal, separate them by at least 30 min.   Start the meal with protein and fat and end with carbs.  Carry a snack with you everywhere. Best - to contain ~15 g of carbs.

## 2020-08-30 ENCOUNTER — Ambulatory Visit: Payer: Medicare Other | Admitting: Vascular Surgery

## 2020-08-30 ENCOUNTER — Encounter (HOSPITAL_COMMUNITY): Payer: Medicare Other

## 2020-10-11 ENCOUNTER — Other Ambulatory Visit: Payer: Self-pay

## 2020-10-11 ENCOUNTER — Ambulatory Visit (INDEPENDENT_AMBULATORY_CARE_PROVIDER_SITE_OTHER): Payer: Medicare Other | Admitting: Vascular Surgery

## 2020-10-11 ENCOUNTER — Ambulatory Visit (HOSPITAL_COMMUNITY)
Admission: RE | Admit: 2020-10-11 | Discharge: 2020-10-11 | Disposition: A | Discharge status: Home / Self Care | Payer: Medicare Other | Source: Ambulatory Visit | Attending: Vascular Surgery | Admitting: Vascular Surgery

## 2020-10-11 ENCOUNTER — Encounter: Payer: Self-pay | Admitting: Vascular Surgery

## 2020-10-11 VITALS — BP 91/57 | HR 66 | Temp 98.1°F | Resp 20 | Ht 69.25 in | Wt 128.0 lb

## 2020-10-11 DIAGNOSIS — I83811 Varicose veins of right lower extremities with pain: Secondary | ICD-10-CM

## 2020-10-11 DIAGNOSIS — M9270 Juvenile osteochondrosis of metatarsus, unspecified foot: Secondary | ICD-10-CM

## 2020-10-11 NOTE — Progress Notes (Signed)
Patient is a 65 year old female referred for evaluation of possible arterial etiology for avascular necrosis of a metatarsal bone in her foot.  She has had pain in her foot for several months.  She states this has improved a little bit.  The foot usually only hurts when she walks on it.  Her walking distance has improved.  She does not describe claudication or rest pain.  She was recently noted to have avascular necrosis.  She is being evaluated by Dr. Doran Durand for possible operation and he wanted [place] to evaluate the patient for possible arterial compromise before proceeding.  I previously saw the patient in 2020 for skin color changes in her feet.  At that time she had normal ABIs with triphasic flow.  Her skin color changes were thought to be due to vasoreactivity rather than arterial obstruction.  She grew up around family that were all smokers but she is not personally a tobacco abuser.  We have also evaluated her in the past for varicose veins.  Previous duplex scan showed mild reflux in the mid left greater saphenous vein and also in the right lesser saphenous vein but the vein diameter was only 2 mm so a laser ablation was not performed.  She also had several areas of spider type varicosities and was considering whether or not to do sclerotherapy on these.  Past Medical History:  Diagnosis Date  . Allergic rhinitis   . Allergy   . Aneurysm of cardiac wall, congenital    a.  septal aneurysm extending into the RVOT (by cardiac MRI in 08/2013)  . Anxiety   . Aortic atherosclerosis ([place])   . Arthritis   . Asthma   . Basal cell carcinoma   . Depression   . Heart murmur   . Hypothyroidism   . Leukopenia    idiopathic  . Lyme disease   . Mitral valve prolapse   . MVP (mitral valve prolapse)   . Orthostatic hypotension   . Osteoporosis   . Pancreatic cyst   . Peripheral neuropathy 06/09/2012  . Pernicious anemia   . Pernicious anemia   . Pernicious anemia   . Plantar fasciitis   .  Pneumothorax     Past Surgical History:  Procedure Laterality Date  . COLONOSCOPY WITH PROPOFOL N/A 02/22/2013   Procedure: COLONOSCOPY WITH PROPOFOL;  Surgeon: Garlan Fair, MD;  Location: [facility];  Service: Endoscopy;  Laterality: N/A;  . MOUTH SURGERY  01/2012   bone graft in mouth  . NASAL SINUS SURGERY Right 06/12/2014   Procedure: RIGHT ENDOSCOPIC MAXILLARY ANTROSTOMY;  Surgeon: Ascencion Dike, MD;  Location: [facility];  Service: ENT;  Laterality: Right;  . ROOT CANAL  02/27/2012  . TONSILLECTOMY  1962    Current Outpatient Medications on File Prior to Visit  Medication Sig Dispense Refill  . 5-Hydroxytryptophan (5-HTP) 100 MG CAPS Take 100 mg by mouth daily.     [name] albuterol (VENTOLIN HFA) 108 (90 Base) MCG/ACT inhaler Inhale 1 puff into the lungs daily as needed for wheezing or shortness of breath.     Francia Greaves THYROID 30 MG tablet Take by mouth daily. Patient taking 30 MG 5 days a week and 15 MG 2 days.  6  . Ascorbic Acid (VITAMIN C PO) Take 2,500 Units by mouth daily.     . Cholecalciferol (VITAMIN D3) 10000 units capsule Take 10,000 Units by mouth daily.     . clonazePAM (KLONOPIN) 1 MG tablet Take 1 mg  by mouth daily as needed for anxiety.     . Cyanocobalamin (VITAMIN B-12 ER PO) Take 5,000 mcg by mouth daily.     [name] MAGNESIUM CITRATE PO Take 800 mg by mouth daily.     . Menaquinone-7 (VITAMIN K2 PO) Take 1 tablet by mouth daily.    . NON FORMULARY Take 1,000 mcg by mouth daily. Methyl Folate chewable tablet    . NONFORMULARY OR COMPOUNDED ITEM Apply 2 drops topically in the morning and at bedtime. Estradiol-Progesterone 0.06-5-0.33 MG liquid drops     No current facility-administered medications on file prior to visit.    Social History   Socioeconomic History  . Marital status: Divorced    Spouse name: Not on file  . Number of children: 0  . Years of education: Not on file  . Highest education level: Not on file  Occupational History     Comment: Career and life coach  Tobacco Use  . Smoking status: Never Smoker  . Smokeless tobacco: Never Used  Vaping Use  . Vaping Use: Never used  Substance and Sexual Activity  . Alcohol use: No    Alcohol/week: 0.0 standard drinks  . Drug use: No  . Sexual activity: Not on file  Other Topics Concern  . Not on file  Social History Narrative   Patient is single and lives alone.   Patient is self-employed, career and life coaching.   Patient drinks two to four cups of caffeine daily.   Social Determinants of Health   Financial Resource Strain: Not on file  Food Insecurity: Not on file  Transportation Needs: Not on file  Physical Activity: Not on file  Stress: Not on file  Social Connections: Not on file  Intimate Partner Violence: Not on file   Physical exam:  Vitals:   10/11/20 1420  BP: (!) 91/57  Pulse: 66  Resp: 20  Temp: 98.1 F (36.7 C)  SpO2: 98%  Weight: 128 lb (58.1 kg)  Height: 5' 9.25" (1.759 m)    Extremities: 2+ dorsalis pedis pulses bilaterally absent posterior tibial pulses no real pain on palpation of either foot  Skin: No ulcer or wound, scattered diffuse spider type varicosities pretibial region and thigh bilaterally, cluster of varicosities right pretibial region 3 to 4 mm diameter  Data: Patient had bilateral ABIs performed today which were triphasic greater than 1 and normal bilaterally.  This is unchanged from her ABIs performed in 2020.  She also had a reflux study done in 2020 which I reviewed again today.  This again showed mild reflux in the lesser saphenous vein right leg but only 2 mm diameter.  She also had mild reflux in the left mid greater saphenous vein.  There was no reflux in the right greater saphenous vein.  Assessment: #1 no obvious large vessel vascular etiology for her avascular necrosis in her right foot.  She has had 2 previous normal ABI studies and this is unchanged and she has palpable pulses on exam.  I do not believe she  needs further evaluation from an arterial standpoint.  She should have adequate blood flow for wound healing.  2.  Varicose veins minimal symptoms except for appearance.  She is considering sclerotherapy of her spider varicosities and potentially some stab avulsions on the right leg.  We will get back to her regarding how much this would cost her out-of-pocket versus whether or not it would be covered by insurance.  Otherwise she will follow up on an as-needed  basis.  Ruta Hinds, MD [facility] Office: [phone]

## 2020-10-20 NOTE — Progress Notes (Deleted)
Psychotherapy Progress Note [facility] Luan Moore, PhD LP  Patient ID: Megan [name]     MRN: [id number] Therapy format: {Therapy Types:21967::"Individual psychotherapy"} Date: 08/07/2020      Start: ***:***     Stop: ***:***     Time Spent: *** min Location: {SvcLoc:22530::"In-person"}   Session narrative (presenting needs, interim history, self-report of stressors and symptoms, applications of prior therapy, status changes, and interventions made in session) ***  Therapeutic modalities: {AM:23362::"Cognitive Behavioral Therapy","Solution-Oriented/Positive Psychology"}  Mental Status/Observations:  Appearance:   {PSY:22683}     Behavior:  {PSY:21022743}  Motor:  {PSY:22302}  Speech/Language:   {PSY:22685}  Affect:  {PSY:22687}  Mood:  {PSY:31886}  Thought process:  {PSY:31888}  Thought content:    {PSY:8483145443}  Sensory/Perceptual disturbances:    {PSY:5614814365}  Orientation:  {Psych Orientation:23301::"Fully oriented"}  Attention:  {Good-Fair-Poor ratings:23770::"Good"}    Concentration:  {Good-Fair-Poor ratings:23770::"Good"}  Memory:  {PSY:360-235-9718}  Insight:    {Good-Fair-Poor ratings:23770::"Good"}  Judgment:   {Good-Fair-Poor ratings:23770::"Good"}  Impulse Control:  {Good-Fair-Poor ratings:23770::"Good"}   Risk Assessment: Danger to Self: {Risk:22599::"No"} Self-injurious Behavior: {Risk:22599::"No"} Danger to Others: {Risk:22599::"No"} Physical Aggression / Violence: {Risk:22599::"No"} Duty to Warn: {AMYesNo:22526::"No"} Access to Firearms a concern: {AMYesNo:22526::"No"}  Assessment of progress:  {Progress:22147::"progressing"}  Diagnosis: No diagnosis found. Plan:  . *** . Other recommendations/advice as may be noted above . Continue to utilize previously learned skills ad lib . Maintain medication as prescribed and work faithfully with relevant prescriber(s) if any changes are desired or seem indicated . Call the clinic  on-call service, present to ER, or call 911 if any life-threatening psychiatric crisis . No follow-ups on file. . Already scheduled visit in this office Visit date not found.  Blanchie Serve, PhD Luan Moore, PhD LP Clinical Psychologist, [facility] [address] [place], [place] [address] [phone]

## 2020-10-20 NOTE — Progress Notes (Signed)
Admin note for non-service contact  Patient ID: Megan [name]  MRN: [id number] DATE: 08/07/2020  Appointment rescheduled by patient.  Not a noshow.  Megan Serve, PhD Luan Moore, PhD LP Clinical Psychologist, [facility] [address] [place], [place] [address] [phone]

## 2020-10-31 ENCOUNTER — Encounter: Payer: Self-pay | Admitting: Internal Medicine

## 2020-10-31 ENCOUNTER — Ambulatory Visit (INDEPENDENT_AMBULATORY_CARE_PROVIDER_SITE_OTHER): Payer: Medicare Other | Admitting: Internal Medicine

## 2020-10-31 VITALS — BP 118/70 | HR 61 | Ht 69.0 in | Wt 128.6 lb

## 2020-10-31 DIAGNOSIS — D510 Vitamin B12 deficiency anemia due to intrinsic factor deficiency: Secondary | ICD-10-CM | POA: Diagnosis not present

## 2020-10-31 DIAGNOSIS — K862 Cyst of pancreas: Secondary | ICD-10-CM | POA: Diagnosis not present

## 2020-10-31 DIAGNOSIS — K5909 Other constipation: Secondary | ICD-10-CM | POA: Diagnosis not present

## 2020-10-31 DIAGNOSIS — E162 Hypoglycemia, unspecified: Secondary | ICD-10-CM

## 2020-10-31 NOTE — Patient Instructions (Signed)
We will contact you regarding endoscopic ultrasound.  If you are age 67 or older, your body mass index should be between 23-30. Your Body mass index is 18.99 kg/m. If this is out of the aforementioned range listed, please consider follow up with your Primary Care Provider.  Due to recent changes in healthcare laws, you may see the results of your imaging and laboratory studies on MyChart before your provider has had a chance to review them.  We understand that in some cases there may be results that are confusing or concerning to you. Not all laboratory results come back in the same time frame and the provider may be waiting for multiple results in order to interpret others.  Please give [place] 48 hours in order for your provider to thoroughly review all the results before contacting the office for clarification of your results.

## 2020-10-31 NOTE — Progress Notes (Addendum)
Subjective:    Patient ID: Megan [name], female    DOB: Oct 31, 1955, 65 y.o.   MRN: [id number]  HPI Megan [name] is a 65 year old female with a history of chronic constipation/colonic inertia, family history of colon cancer in her mother, family history of colon polyps in her father, pernicious anemia, hypothyroidism, prior exposure to Lyme disease, small pancreatic cyst being monitored (possible sidebranch IPMN), osteoporosis who is here for follow-up.  She was last seen in May 2021 and she is here alone today.  She reports that she was seen in the emergency department in November with hypoglycemia.  She reports it took hours and IV glucose to get her blood sugar to improve.  She was seen by Dr. Cruzita Lederer with endocrinology and reports that she likely had a "endocrine crash" but no other specific diagnosis was made.  Her A1c was 4.9%.  She monitored her glucose and noticed that it tends to be the highest at a.m. fasting around 110 but at other points during the day it ranges between 60 and 90.  She has not had any other extreme glucose lows.  She does continue to feel shaky and weak and have a neuropathy type symptom.  Most days she reports she just "feels rotten".  She has changed her diet since this episode in November and is working with a nutritionist.  Previously she would only eat 2 meals a day with lunch and dinner but she has started to try to eat 3 meals a day and considerably less sugar.  She is eating very high amounts of fruits and vegetables.  With the 3 meals a day she found her stools to be bulky and larger.  She also felt dizzy at times.  She started taking charcoal intermittently which helped with some of her dizziness and got symptoms.  Stools most recently have been soft.  No blood or melena in her stool.  She has continued to take magnesium citrate and magnesium oxide.  She reduced her magnesium citrate from 800 mg to 400 mg daily.  She takes 290 mg of magnesium oxide.  She is also  added Chia seeds and flaxseed to her diet.  She has questions regarding repeat MRI which was recommended in May but she has concerns about gadolinium.  She is heard of gadolinium complications both relating to the nervous system and also fibrosis.  She asked if there are other options other than MRI and also the need to continue surveying the pancreatic cyst.  She also has questions about end-of-life care as she is redoing her will as well as healthcare power of attorney information.  She wonders whether there is a difference between IV hydration and nutrition towards the end of life.   Review of Systems As per HPI, otherwise negative  Current Medications, Allergies, Past Medical History, Past Surgical History, Family History and Social History were reviewed in [employer] record.     Objective:   Physical Exam BP 118/70 (BP Location: Left Arm, Patient Position: Sitting)   Pulse 61   Ht 5\' 9"  (1.753 m)   Wt 128 lb 9.6 oz (58.3 kg)   SpO2 98%   BMI 18.99 kg/m  Gen: awake, alert, NAD HEENT: anicteric Neuro: nonfocal      Assessment & Plan:  65 year old female with a history of chronic constipation/colonic inertia, family history of colon cancer in her mother, family history of colon polyps in her father, pernicious anemia, hypothyroidism, prior exposure to Lyme disease, small  pancreatic cyst being monitored (possible sidebranch IPMN), osteoporosis who is here for follow-up.   1. Pernicious anemia -- continue B12 supplementation.  We had previously confirmed this with antiparietal and antiintrinsic factor antibodies.  We also previously discussed and recommended an upper endoscopy as this condition can be associated with an autoimmune atrophic gastritis.  This was previously deferred though she may consider this if we pursue EUS, see #2  2.  Pancreatic cyst/sidebranch IPMN --she asks about the need for MRI surveillance.  She has concern about gadolinium based  contrast and possible neurologic side effects over time.  We discussed this at length and I do think the risk of the contrast to her is very very low.  We also discussed how EUS could be used to survey the cystic pancreatic lesion.  Given that she may benefit from upper endoscopy as discussed in #1 she would like for me to discuss possible EUS with one of my colleagues that performed this procedure.  I do think surveillance is indicated though the interval can be in question. --Possible EUS versus MRI with contrast plus MRCP; if EUS pursued also perform EGD to evaluate for gastritis in the setting of pernicious anemia  3.  Chronic constipation --she has been treating this with magnesium and since she changed her diet her bowel movements have been more frequent.  We discussed that magnesium can cause some neurologic side effects at high doses.  She does prefer to avoid pharmacologic therapy for constipation if possible  4.  Hypoglycemia --she had a hypoglycemia episode in the absence of diabetes.  She has seen endocrinology and this was thought likely to be idiopathic and more dietary related.  She has researched and thought about this issue a lot.  She wonders if a pancreatic cyst could be an insulin producing cyst such as an insulinoma.  I think this is very unlikely.  She asks about further evaluation which I would defer to endocrinology.  Insulinoma evaluation can be quite tricky with a 72-hour fast study versus a mixed meal test.  5.  Family history of colon cancer --up-to-date with screening, repeat July 2024  45 minutes total spent today including patient facing time, coordination of care, reviewing medical history/procedures/pertinent radiology studies, and documentation of the encounter.  Addendum:  Dr. Ardis Hughs reviewed her pancreatic cyst and previous imaging.  He recommended MRI is the favored modality but disagreed with the annual surveillance interval.  He recommended surveillance again in  2023 and is stable no further surveillance will be necessary. I appreciate his opinion and we will plan to survey again next year with MRI and if stable discontinue surveillance for the small lesion Patient notified via [place]

## 2020-11-02 ENCOUNTER — Other Ambulatory Visit: Payer: Self-pay

## 2020-11-02 DIAGNOSIS — D510 Vitamin B12 deficiency anemia due to intrinsic factor deficiency: Secondary | ICD-10-CM

## 2020-11-02 NOTE — Telephone Encounter (Signed)
Please let patient know that I received her email  In the setting of her pernicious anemia I do recommend we proceed with upper endoscopy in the [place] This can be scheduled at her convenience  Also okay to order a magnesium blood level and a SARS-CoV-2 antibody test.  She requested a specific antibody test, see her email, which can be ordered if it is available at our laboratory  Thanks [place]

## 2020-11-12 ENCOUNTER — Ambulatory Visit (AMBULATORY_SURGERY_CENTER): Payer: Self-pay

## 2020-11-12 ENCOUNTER — Other Ambulatory Visit: Payer: Self-pay

## 2020-11-12 VITALS — Ht 69.0 in | Wt 129.0 lb

## 2020-11-12 DIAGNOSIS — D510 Vitamin B12 deficiency anemia due to intrinsic factor deficiency: Secondary | ICD-10-CM

## 2020-11-12 NOTE — Progress Notes (Signed)
No egg or soy allergy known to patient  No issues with past sedation with any surgeries or procedures Patient denies ever being told they had issues or difficulty with intubation  No FH of Malignant Hyperthermia No diet pills per patient No home 02 use per patient  No blood thinners per patient  Pt reports issues with constipation ---takes Magnesium for tx No A fib or A flutter  EMMI video via MyChart  COVID 19 guidelines implemented in [place] today with Pt and RN  Due to the COVID-19 pandemic we are asking patients to follow certain guidelines. Pt aware of COVID protocols and [facility] guidelines;  Patient denies loose teeth, dental implant, dentures, partials; Patient reports missing teeth, crowns, bonded teeth; and a bridge

## 2020-11-15 ENCOUNTER — Encounter: Payer: Self-pay | Admitting: Neurology

## 2020-11-15 ENCOUNTER — Ambulatory Visit (INDEPENDENT_AMBULATORY_CARE_PROVIDER_SITE_OTHER): Payer: Medicare Other | Admitting: Neurology

## 2020-11-15 ENCOUNTER — Other Ambulatory Visit: Payer: Self-pay

## 2020-11-15 VITALS — BP 94/46 | HR 68 | Ht 69.25 in | Wt 127.0 lb

## 2020-11-15 DIAGNOSIS — G8929 Other chronic pain: Secondary | ICD-10-CM

## 2020-11-15 DIAGNOSIS — M79671 Pain in right foot: Secondary | ICD-10-CM | POA: Diagnosis not present

## 2020-11-15 DIAGNOSIS — M79672 Pain in left foot: Secondary | ICD-10-CM

## 2020-11-15 NOTE — Progress Notes (Signed)
[facility] Clinic Note - Initial Visit   Date: 11/15/20  Megan [name] MRN: [id number] DOB: [birth year]   Dear Dr. Laurann Montana:  Thank you for your kind referral of Megan [name] for consultation of generalized fatigue and feet pain. Although her history is well known to you, please allow [place] to reiterate it for the purpose of our medical record. The patient was accompanied to the clinic by self.   History of Present Illness: Megan [name] is a 65 y.o. right-handed female presenting for evaluation of generalized fatigue and feet pain. She has seen me previously for these complaints in 2015-2017 at which time I reviewed her very extensive evaluations under multiple different physicians, which was largely unremarkable.  She has seen Sports Medicine, Podiatry, neurology ([facility] Conners, [facility], [facility], [facility]).  Previous evaluation has included several EMGs, some showing evidence of neuropathy and others which are normal. She is also underwent extensive laboratory testing including CRP, ANA, and color, C3, C4, VDRL, cryoglobulin, TPO antibody, thyroglobulin antibody, CMV, EBV, HIV, hepatitis panel, UPEP, and several paraneoplastic antibody panels which have been nondiagnostic.  Repeat electrodiagnostic testing peformed (2015) by myself does not show any evidence of large fiber neuropathy.  I did not have anything else to offer and referred her to [facility].  She has been seeing Dr. George Hugh and diagnosed with idiopathic myelopathy which is being managed symptomatically.  She remains frustrated at the lack of diagnosis, poor quality of life, and ongoing chronic feet pain, discoloration of the feet.  She is unable to be as active, as she previously was because of generalized fatigue.  She is able to perform IADLs and ADLs.   She is here for my opinion.  Out-side paper records, electronic medical record, and images have been reviewed where  available and summarized as:  Lab Results  Component Value Date   HGBA1C 4.9 07/12/2020   Lab Results  Component Value Date   VITAMINB12 1,342 (H) 05/29/2020   Lab Results  Component Value Date   TSH 1.65 07/12/2020   Lab Results  Component Value Date   ESRSEDRATE 1 03/05/2016    Past Medical History:  Diagnosis Date  . Allergic rhinitis   . Allergy   . Aneurysm of cardiac wall, congenital    a.  septal aneurysm extending into the RVOT (by cardiac MRI in 08/2013)  . Anxiety   . Aortic atherosclerosis ([place])   . Arthritis   . Asthma   . Basal cell carcinoma   . Depression   . Heart murmur   . Hypothyroidism    on meds  . Leukopenia    idiopathic  . Lyme disease   . Mitral valve prolapse   . MVP (mitral valve prolapse)   . Orthostatic hypotension   . Osteoporosis   . Pancreatic cyst   . Peripheral neuropathy 06/09/2012  . Pernicious anemia   . Pernicious anemia   . Pernicious anemia   . Plantar fasciitis   . Pneumothorax     Past Surgical History:  Procedure Laterality Date  . COLONOSCOPY  2019   JMP-  . COLONOSCOPY WITH PROPOFOL N/A 02/22/2013   Procedure: COLONOSCOPY WITH PROPOFOL;  Surgeon: Garlan Fair, MD;  Location: [facility];  Service: Endoscopy;  Laterality: N/A;  . MOUTH SURGERY  01/2012   bone graft in mouth  . NASAL SINUS SURGERY Right 06/12/2014   Procedure: RIGHT ENDOSCOPIC MAXILLARY ANTROSTOMY;  Surgeon: Ascencion Dike, MD;  Location:  [place];  Service: ENT;  Laterality: Right;  . ROOT CANAL  02/27/2012  . TONSILLECTOMY  1962  . WISDOM TOOTH EXTRACTION       Medications:  Outpatient Encounter Medications as of 11/15/2020  Medication Sig  . 5-Hydroxytryptophan (5-HTP) 100 MG CAPS Take 100 mg by mouth daily.   [name] albuterol (VENTOLIN HFA) 108 (90 Base) MCG/ACT inhaler Inhale 1 puff into the lungs daily as needed for wheezing or shortness of breath.   Francia Greaves THYROID 30 MG tablet Take by mouth daily. Patient taking 30 MG 5  days a week and 15 MG 2 days.  . Ascorbic Acid (VITAMIN C PO) Take 2,500 Units by mouth daily.   . Cholecalciferol (VITAMIN D3) 10000 units capsule Take 10,000 Units by mouth daily.   . clonazePAM (KLONOPIN) 1 MG tablet Take 1 mg by mouth daily as needed for anxiety.   . Cyanocobalamin (VITAMIN B-12 ER PO) Take 5,000 mcg by mouth daily.   [name] MAGNESIUM CITRATE PO Take 800 mg by mouth daily.   [name] MAGNESIUM OXIDE 400 PO Take by mouth.  . Menaquinone-7 (VITAMIN K2 PO) Take 1 tablet by mouth daily.  . NON FORMULARY Take 1,000 mcg by mouth daily. Methyl Folate chewable tablet  . NONFORMULARY OR COMPOUNDED ITEM Apply 2 drops topically in the morning and at bedtime. Estradiol-Progesterone 0.06-5-0.33 MG liquid drops  . Omega-3 Fatty Acids (FISH OIL) 1000 MG CAPS daily as needed.  . vitamin A 3 MG (10000 UNITS) capsule Take 10,000 Units by mouth daily.   No facility-administered encounter medications on file as of 11/15/2020.    Allergies:  Allergies  Allergen Reactions  . Augmentin [Amoxicillin-Pot Clavulanate] Diarrhea  . Avelox [Moxifloxacin] Other (See Comments)    neuropathy  . Clindamycin/Lincomycin Diarrhea  . Iron     Other reaction(s): severe constipation    Family History: Family History  Problem Relation Age of Onset  . Hyperlipidemia Father   . Heart failure Father   . Prostate cancer Father 74  . Kidney failure Father   . Hypertension Father   . Colon polyps Father   . Angina Mother   . Hypertension Mother   . Lung cancer Mother 19       again at 75/79-smoker  . Colon cancer Mother 10  . Colon polyps Mother   . Lung cancer Paternal Grandfather   . Breast cancer Paternal Grandmother   . Diabetes Paternal Grandmother   . Esophageal cancer Neg Hx   . Liver cancer Neg Hx   . Pancreatic cancer Neg Hx   . Rectal cancer Neg Hx   . Stomach cancer Neg Hx     Social History: Social History   Tobacco Use  . Smoking status: Never Smoker  . Smokeless tobacco: Never Used   Vaping Use  . Vaping Use: Never used  Substance Use Topics  . Alcohol use: No    Alcohol/week: 0.0 standard drinks  . Drug use: No   Social History   Social History Narrative   Patient is single and lives alone.   Patient is self-employed, career and life coaching.   Patient drinks two to four cups of caffeine daily.   Right handed     Vital Signs:  BP (!) 94/46   Pulse 68   Ht 5' 9.25" (1.759 m)   Wt 127 lb (57.6 kg)   SpO2 100%   BMI 18.62 kg/m   Neurological Exam: MENTAL STATUS including orientation to time, place, person,  recent and remote memory, attention span and concentration, language, and fund of knowledge is normal.  Speech is not dysarthric.  CRANIAL NERVES: II:  No visual field defects.   III-IV-VI: Pupils equal round and reactive to light.  Normal conjugate, extra-ocular eye movements in all directions of gaze.  No nystagmus.  No ptosis.   V:  Normal facial sensation.    VII:  Normal facial symmetry and movements.   VIII:  Normal hearing and vestibular function.   IX-X:  Normal palatal movement.   XI:  Normal shoulder shrug and head rotation.   XII:  Normal tongue strength and range of motion, no deviation or fasciculation.  MOTOR:  No atrophy, fasciculations or abnormal movements.  No pronator drift.   Upper Extremity:  Right  Left  Deltoid  5/5   5/5   Biceps  5/5   5/5   Triceps  5/5   5/5   Infraspinatus 5/5  5/5  Medial pectoralis 5/5  5/5  Wrist extensors  5/5   5/5   Wrist flexors  5/5   5/5   Finger extensors  5/5   5/5   Finger flexors  5/5   5/5   Dorsal interossei  5/5   5/5   Abductor pollicis  5/5   5/5   Tone (Ashworth scale)  0  0   Lower Extremity:  Right  Left  Hip flexors  5/5   5/5   Hip extensors  5/5   5/5   Adductor 5/5  5/5  Abductor 5/5  5/5  Knee flexors  5/5   5/5   Knee extensors  5/5   5/5   Dorsiflexors  5/5   5/5   Plantarflexors  5/5   5/5   Toe extensors  5/5   5/5   Toe flexors  5/5   5/5   Tone  (Ashworth scale)  0  0   MSRs:  Right        Left                  brachioradialis 2+  2+  biceps 2+  2+  triceps 2+  2+  patellar 2+  2+  ankle jerk 2+  2+  Hoffman no  no  plantar response down  down   SENSORY:  Normal and symmetric perception of light touch, pinprick, vibration, and proprioception.  Romberg's sign absent.   COORDINATION/GAIT: Normal finger-to- nose-finger.  Intact rapid alternating movements bilaterally.  Gait narrow based and stable. Tandem and stressed gait intact.    IMPRESSION: Myriad of symptoms including generalized fatigue, chronic feet pain which has been evaluated extensively in the past.  Her neurological exam is normal.  Specifically, no findings to suggest neuropathy.  I explained that many times in medicine, we do not always have treatable conditions and management is often supportive.  She is seeking a treatable diagnosis, unfortunately, I do not have one within the realm of neurology to offer.   She will continue to see Dr. George Hugh and discuss options with her.    Thank you for allowing me to participate in patient's care.  If I can answer any additional questions, I would be pleased to do so.    Sincerely,    Kylle Lall K. Posey Pronto, DO

## 2020-11-20 ENCOUNTER — Telehealth: Payer: Self-pay | Admitting: Internal Medicine

## 2020-11-20 NOTE — Telephone Encounter (Signed)
Hi Dr. Hilarie Fredrickson., this patient just called to cancel procedure that was scheduled on 11/23/20 because she is having dental issues that need to address first. Patient will call back to reschedule. Thank you.

## 2020-11-23 ENCOUNTER — Encounter: Payer: Medicare Other | Admitting: Internal Medicine

## 2020-12-19 DIAGNOSIS — E7800 Pure hypercholesterolemia, unspecified: Secondary | ICD-10-CM

## 2020-12-26 NOTE — Progress Notes (Signed)
HPI: FUhyperlipidemia anddyspnea. MRA January 2015 showed aneurysmal dilatation of the membranous ventricular septum extending into the right ventricular outflow tract. There was no sinus of Valsalva aneurysm. Ejection fraction 57%. Carotid Dopplers August 2016 normal. Echocardiogram May 2018 showed normal LV systolic function and mild mitral regurgitation. 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 Stress echocardiogramFeb 2019was normal. CTA June 2019 showed aneurysm of the membranous ventricular septum that bulges into the right ventricular outflow tract. There is no connection between the left ventricle and right ventricle. There is no sinus of Valsalva aneurysm. Calcium score 0. No coronary disease. 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 There was also note of aortic atherosclerosis. Seen with palpitations and monitor June 2021 showed sinus bradycardia, normal sinus rhythm, sinus tachycardia, occasional PAC, PVCs, rare couplet and brief PAT. ABIs February 2022 normal.  Since last seen  she denies dyspnea, chest pain or syncope.  Occasional brief flutters but no sustained palpitations.  Current Outpatient Medications  Medication Sig Dispense Refill  . 5-Hydroxytryptophan (5-HTP) 100 MG CAPS Take 100 mg by mouth daily.     [name] albuterol (VENTOLIN HFA) 108 (90 Base) MCG/ACT inhaler Inhale 1 puff into the lungs daily as needed for wheezing or shortness of breath.     Francia Greaves THYROID 30 MG tablet Take by mouth daily. Patient taking 30 MG 5 days a week and 15 MG 2 days.  6  . Ascorbic Acid (VITAMIN C PO) Take 2,500 Units by mouth daily.     . Biotin 10000 MCG TABS Take 10,000 mcg by mouth daily.    . Cholecalciferol (VITAMIN D3) 10000 units capsule Take  10,000 Units by mouth daily.     . clonazePAM (KLONOPIN) 1 MG tablet Take 1 mg by mouth daily as needed for anxiety.     . Cyanocobalamin (VITAMIN B-12 ER PO) Take 5,000 mcg by mouth daily.     [name] MAGNESIUM OXIDE 400 PO Take by mouth.    . Menaquinone-7 (VITAMIN K2 PO) Take 1 tablet by mouth daily.    . NON FORMULARY Take 1,000 mcg by mouth daily. Methyl Folate chewable tablet    . NONFORMULARY OR COMPOUNDED ITEM Apply 2 drops topically in the morning. Estradiol-Progesterone 0.06-5-0.33 MG liquid drops    . Omega-3 Fatty Acids (FISH OIL) 1000 MG CAPS daily as needed.    . vitamin A 3 MG (10000 UNITS) capsule Take 10,000 Units by mouth daily.     No current facility-administered medications for this visit.     Past Medical History:  Diagnosis Date  . Allergic rhinitis   . Allergy   . Aneurysm of cardiac wall, congenital    a.  septal aneurysm extending into the RVOT (by cardiac MRI in 08/2013)  . Anxiety   . Aortic atherosclerosis ([place])   . Arthritis   . Asthma   . Basal cell carcinoma   . Depression   . Heart murmur   . Hypothyroidism    on meds  . Leukopenia    idiopathic  . Lyme disease   . Mitral valve prolapse   . MVP (mitral valve prolapse)   . Orthostatic hypotension   . Osteoporosis   . Pancreatic cyst   . Peripheral neuropathy 06/09/2012  . Pernicious anemia   .  Pernicious anemia   . Pernicious anemia   . Plantar fasciitis   . Pneumothorax     Past Surgical History:  Procedure Laterality Date  . COLONOSCOPY  2019   JMP-  . COLONOSCOPY WITH PROPOFOL N/A 02/22/2013   Procedure: COLONOSCOPY WITH PROPOFOL;  Surgeon: Garlan Fair, MD;  Location: [facility];  Service: Endoscopy;  Laterality: N/A;  . MOUTH SURGERY  01/2012   bone graft in mouth  . NASAL SINUS SURGERY Right 06/12/2014   Procedure: RIGHT ENDOSCOPIC MAXILLARY ANTROSTOMY;  Surgeon: Ascencion Dike, MD;  Location: [place];  Service: ENT;  Laterality: Right;  . ROOT CANAL  02/27/2012   . TONSILLECTOMY  1962  . WISDOM TOOTH EXTRACTION      Social History   Socioeconomic History  . Marital status: Divorced    Spouse name: Not on file  . Number of children: 0  . Years of education: Not on file  . Highest education level: Not on file  Occupational History    Comment: Career and life coach  Tobacco Use  . Smoking status: Never Smoker  . Smokeless tobacco: Never Used  Vaping Use  . Vaping Use: Never used  Substance and Sexual Activity  . Alcohol use: No    Alcohol/week: 0.0 standard drinks  . Drug use: No  . Sexual activity: Not on file  Other Topics Concern  . Not on file  Social History Narrative   Patient is single and lives alone.   Patient is self-employed, career and life coaching.   Patient drinks two to four cups of caffeine daily.   Right handed    Social Determinants of Health   Financial Resource Strain: Not on file  Food Insecurity: Not on file  Transportation Needs: Not on file  Physical Activity: Not on file  Stress: Not on file  Social Connections: Not on file  Intimate Partner Violence: Not on file    Family History  Problem Relation Age of Onset  . Hyperlipidemia Father   . Heart failure Father   . Prostate cancer Father 75  . Kidney failure Father   . Hypertension Father   . Colon polyps Father   . Angina Mother   . Hypertension Mother   . Lung cancer Mother 8       again at 75/79-smoker  . Colon cancer Mother 16  . Colon polyps Mother   . Lung cancer Paternal Grandfather   . Breast cancer Paternal Grandmother   . Diabetes Paternal Grandmother   . Esophageal cancer Neg Hx   . Liver cancer Neg Hx   . Pancreatic cancer Neg Hx   . Rectal cancer Neg Hx   . Stomach cancer Neg Hx     ROS: Bilateral calf pain but no fevers or chills, productive cough, hemoptysis, dysphasia, odynophagia, melena, hematochezia, dysuria, hematuria, rash, seizure activity, orthopnea, PND, pedal edema, claudication. Remaining systems are  negative.  Physical Exam: Well-developed well-nourished in no acute distress.  Skin is warm and dry.  HEENT is normal.  Neck is supple.  Chest is clear to auscultation with normal expansion.  Cardiovascular exam is regular rate and rhythm.  Abdominal exam nontender or distended. No masses palpated. Extremities show no edema. neuro grossly intact  A/P  1 history of chest pain-previous calcium score was 0 and symptoms atypical.  No recurrences.  No plans for further ischemia evaluation.  2 palpitations-previous monitor showed no significant arrhythmia.  3 hyperlipidemia-plan to continue diet.  Given mild  aortic atherosclerosis we have discussed statin previously but she would prefer to avoid medications if possible.  4 history of aneurysmal dilatation of the interventricular septum extending into the right ventricular outflow tract-no ventricular septal defect or sinus of Valsalva aneurysm noted on previous evaluation.  5 history of orthostatic hypotension-no recent symptoms.  Continue increased sodium and fluid intake.  Kirk Ruths, MD

## 2021-01-01 LAB — LIPID PANEL
Chol/HDL Ratio: 2.4 ratio (ref 0.0–4.4)
Cholesterol, Total: 192 mg/dL (ref 100–199)
HDL: 79 mg/dL (ref >39)
LDL Chol Calc (NIH): 107 mg/dL — ABNORMAL HIGH (ref 0–99)
Triglycerides: 29 mg/dL (ref 0–149)
VLDL Cholesterol Cal: 6 mg/dL (ref 5–40)

## 2021-01-01 LAB — C-REACTIVE PROTEIN: CRP: <1 mg/L (ref 0–10)

## 2021-01-07 ENCOUNTER — Encounter: Payer: Self-pay | Admitting: Cardiology

## 2021-01-07 ENCOUNTER — Other Ambulatory Visit: Payer: Self-pay

## 2021-01-07 ENCOUNTER — Ambulatory Visit (INDEPENDENT_AMBULATORY_CARE_PROVIDER_SITE_OTHER): Payer: Medicare Other | Admitting: Cardiology

## 2021-01-07 VITALS — BP 91/49 | HR 67 | Ht 69.0 in | Wt 127.4 lb

## 2021-01-07 DIAGNOSIS — R002 Palpitations: Secondary | ICD-10-CM

## 2021-01-07 DIAGNOSIS — E7800 Pure hypercholesterolemia, unspecified: Secondary | ICD-10-CM | POA: Diagnosis not present

## 2021-01-07 DIAGNOSIS — R072 Precordial pain: Secondary | ICD-10-CM | POA: Diagnosis not present

## 2021-01-07 NOTE — Patient Instructions (Signed)
  Lab Work:  Your physician recommends that you return for lab work in: 6 MONTHS-FASTING  If you have labs (blood work) drawn today and your tests are completely normal, you will receive your results only by: [name] MyChart Message (if you have MyChart) OR . A paper copy in the mail If you have any lab test that is abnormal or we need to change your treatment, we will call you to review the results.  Follow-Up: At [facility], you and your health needs are our priority.  As part of our continuing mission to provide you with exceptional heart care, we have created designated Provider Care Teams.  These Care Teams include your primary Cardiologist (physician) and Advanced Practice Providers (APPs -  Physician Assistants and Nurse Practitioners) who all work together to provide you with the care you need, when you need it.  We recommend signing up for the patient portal called "MyChart".  Sign up information is provided on this After Visit Summary.  MyChart is used to connect with patients for Virtual Visits (Telemedicine).  Patients are able to view lab/test results, encounter notes, upcoming appointments, etc.  Non-urgent messages can be sent to your provider as well.   To learn more about what you can do with MyChart, go to [email or web address].    Your next appointment:   12 month(s)  The format for your next appointment:   In Person  Provider:   Kirk Ruths, MD

## 2021-01-10 ENCOUNTER — Ambulatory Visit (INDEPENDENT_AMBULATORY_CARE_PROVIDER_SITE_OTHER): Payer: Medicare Other | Admitting: Podiatry

## 2021-01-10 ENCOUNTER — Ambulatory Visit (INDEPENDENT_AMBULATORY_CARE_PROVIDER_SITE_OTHER): Payer: Medicare Other

## 2021-01-10 ENCOUNTER — Encounter: Payer: Self-pay | Admitting: Podiatry

## 2021-01-10 ENCOUNTER — Other Ambulatory Visit: Payer: Self-pay

## 2021-01-10 DIAGNOSIS — M779 Enthesopathy, unspecified: Secondary | ICD-10-CM | POA: Diagnosis not present

## 2021-01-10 DIAGNOSIS — L600 Ingrowing nail: Secondary | ICD-10-CM

## 2021-01-10 DIAGNOSIS — F329 Major depressive disorder, single episode, unspecified: Secondary | ICD-10-CM | POA: Insufficient documentation

## 2021-01-10 NOTE — Progress Notes (Signed)
Subjective:   Patient ID: Megan [name], female   DOB: 65 y.o.   MRN: [id number]   HPI Patient presents stating her nails are really bothering her on her big toenails and she is looking to some kind of relief stating its been going on for a while   ROS      Objective:  Physical Exam  Neurovascular status intact with incurvation of the hallux nail borders bilateral lateral that are painful when pressed with patient also noted to have neuropathic-like symptomatology     Assessment:  Chronic ingrown toenail deformity hallux bilateral lateral borders with good circulatory status but neuropathy-like symptoms with good digital perfusion     Plan:  H&P reviewed condition.  Due to longstanding nature I do think removal of the corners would be best and I discussed procedure and risk and explained surgical intervention.  She wants to think about this but I do think it will be her best long-term alternative and patient wants to get it done and will reschedule to do this and I did discuss her neuropathy and do not recommend any advanced treatments over what she does

## 2021-01-18 ENCOUNTER — Ambulatory Visit
Admission: RE | Admit: 2021-01-18 | Discharge: 2021-01-18 | Disposition: A | Discharge status: Home / Self Care | Payer: Medicare Other | Source: Ambulatory Visit | Attending: Sports Medicine | Admitting: Sports Medicine

## 2021-01-18 ENCOUNTER — Other Ambulatory Visit: Payer: Self-pay

## 2021-01-18 ENCOUNTER — Other Ambulatory Visit: Payer: Self-pay | Admitting: Sports Medicine

## 2021-01-18 DIAGNOSIS — R52 Pain, unspecified: Secondary | ICD-10-CM

## 2021-01-28 ENCOUNTER — Ambulatory Visit: Payer: Medicare Other | Admitting: Podiatry

## 2021-02-01 ENCOUNTER — Other Ambulatory Visit: Payer: Self-pay | Admitting: Sports Medicine

## 2021-02-01 DIAGNOSIS — M25551 Pain in right hip: Secondary | ICD-10-CM

## 2021-02-18 ENCOUNTER — Encounter: Payer: Self-pay | Admitting: Hematology

## 2021-02-27 ENCOUNTER — Telehealth: Payer: Self-pay | Admitting: Hematology

## 2021-02-27 NOTE — Telephone Encounter (Signed)
Called pt to r/s appts per 7/6 sch msg. No answer. Left msg for pt to call back to r/s.

## 2021-02-27 NOTE — Telephone Encounter (Signed)
Pt called back to r/s app. Pt aware of appt date and time.

## 2021-03-02 ENCOUNTER — Encounter: Payer: Self-pay | Admitting: Hematology

## 2021-03-06 ENCOUNTER — Encounter: Payer: Self-pay | Admitting: Internal Medicine

## 2021-03-06 ENCOUNTER — Ambulatory Visit (INDEPENDENT_AMBULATORY_CARE_PROVIDER_SITE_OTHER): Payer: Medicare Other | Admitting: Internal Medicine

## 2021-03-06 ENCOUNTER — Other Ambulatory Visit: Payer: Self-pay

## 2021-03-06 VITALS — BP 100/70 | HR 72 | Ht 69.0 in | Wt 126.8 lb

## 2021-03-06 DIAGNOSIS — M810 Age-related osteoporosis without current pathological fracture: Secondary | ICD-10-CM | POA: Diagnosis not present

## 2021-03-06 DIAGNOSIS — E039 Hypothyroidism, unspecified: Secondary | ICD-10-CM

## 2021-03-06 DIAGNOSIS — L659 Nonscarring hair loss, unspecified: Secondary | ICD-10-CM

## 2021-03-06 DIAGNOSIS — D539 Nutritional anemia, unspecified: Secondary | ICD-10-CM | POA: Diagnosis not present

## 2021-03-06 DIAGNOSIS — E162 Hypoglycemia, unspecified: Secondary | ICD-10-CM | POA: Diagnosis not present

## 2021-03-06 LAB — CBC
HCT: 42.1 % (ref 36.0–46.0)
Hemoglobin: 14.7 g/dL (ref 12.0–15.0)
MCHC: 34.8 g/dL (ref 30.0–36.0)
MCV: 92.8 fl (ref 78.0–100.0)
Platelets: 166 10*3/uL (ref 150.0–400.0)
RBC: 4.54 Mil/uL (ref 3.87–5.11)
RDW: 12.9 % (ref 11.5–15.5)
WBC: 3 10*3/uL — ABNORMAL LOW (ref 4.0–10.5)

## 2021-03-06 LAB — T3, FREE: T3, Free: 5.5 pg/mL — ABNORMAL HIGH (ref 2.3–4.2)

## 2021-03-06 LAB — T4, FREE: Free T4: 0.74 ng/dL (ref 0.60–1.60)

## 2021-03-06 LAB — HEMOGLOBIN A1C: Hgb A1c MFr Bld: 5.1 % (ref 4.6–6.5)

## 2021-03-06 LAB — TSH: TSH: 2.6 u[IU]/mL (ref 0.35–5.50)

## 2021-03-06 LAB — VITAMIN B12: Vitamin B-12: >1550 pg/mL — ABNORMAL HIGH (ref 211–911)

## 2021-03-06 NOTE — Patient Instructions (Signed)
Please continue Armour 30 mg 5/7 days and 15 mg 2/7 days.  Take the thyroid hormone every day, with water, at least 30 minutes before breakfast, separated by at least 4 hours from: - acid reflux medications - calcium - iron - multivitamins  Please come back for a follow-up appointment in 1 year.

## 2021-03-06 NOTE — Progress Notes (Signed)
Patient ID: Megan [name], female   DOB: [birth year], 65 y.o.   MRN: [id number]   This visit occurred during the SARS-CoV-2 public health emergency.  Safety protocols were in place, including screening questions prior to the visit, additional usage of staff PPE, and extensive cleaning of exam room while observing appropriate contact time as indicated for disinfecting solutions.   HPI  Megan [name] is a 65 y.o.-year-old female, initially referred by her OB/GYN doctor, Dr. Ronita Hipps, returning for follow-up for osteoporosis and hypothyroidism.  Last visit 5 months ago.  Interim history: She complains of more hair loss. She also has R foot pain (avascular necrosis). She has increased anxiety.  Perceived hypoglycemia  Reviewed and addended history: At the end of last 2021, she described that she had dental surgery for a longstanding infection and could only eat soft foods for a period of 3 weeks.  She also cut down carbs after she was found to have a high cholesterol level.  She started to feel poorly with dizziness and flushing and finally presented to the emergency room 07/03/2020.  After being given glucose, sugars improved.  At home, she continued to monitor her blood sugars consistently and she did not have blood sugars lower than 62.Reviewing her detailed diet and blood sugar records, they ranged between 62 (although was, before lunch) to 117.  I reassured her that this was not abnormal.  Reviewed message from patient from 08/23/2020: I am so  sorry to bother you but I am not feeling well and am a little scared - I was able to get an appointment with you for Monday, 1/3 but if you have any suggestions for me over the weekend, I would appreciate it.  My blood sugar at 5AM when I woke up was 112 (I ate well yesterday and had oatmeal with cinnamon at 7PM which was my last food of the day).  I wasn't hungry so didn't eat breakfast which is not unusual for me and at noon it was 61.  I made his a large  smoothie (cranberries, avocado, banana, greens, 2 cups almond milk, protein powder) and at 1:30PM, my sugar is 58 and I am dizzy and my eyes are off.  I just drank 3/4 of a coke.  I am eating really well and don't understand why it is high when I wake up and so low 1.5 hours after eating lunch.  I am being really careful to not eat chocolate covered things (which I sometimes enjoy) and higher fat things like almond butter, but this "clean" diet seems to get my sugar really low.  If you see this before Monday and have any suggestions outside of coke or sugar pills, I would appreciate it.  Thanks!  Megan [name]  We discussed about improving diet at our visit from 08/2020.  I made specific suggestions to avoid postprandial hypoglycemia.  I recommended against a CGM.  She continues to see multiple specialists.  Since last visit, she saw a specialist at [facility] and had a CGM placed.  Sugars were all at goal so she is now off the CGM.  Also, since last visit she had a normal cosyntropin stimulation test ordered by the provider at [facility] (02/05/2021): Time 0: ACTH 18.3, cortisol 9.3 Time 60 minutes: Cortisol 23.7  She was diagnosed with osteoporosis in 2006, but she had lower BMD even before menopause in 2004.  She continues to follow-up with a bone specialist in [place] (Dr. Marcelino Scot) but would also want to continue to  follow-up with me for this problem.  Reviewed previous DXA scans: Date L1-L4 T score FN T score 33% distal Radius  03/01/2021 Baldo Ash, [place]) -3.9 (-6.3%*) RFN: -2.8 LFN: -2.4 -4.5  11/09/2019 Baldo Ash, [place]) -3.5 (-0.4%) RFN: -2.4  LFN: -2.4 n/a  11/03/2018 Baldo Ash, [place]) -3.5 (-2.8%) RFN: -2.3 LFN: -2.1  n/a  11/10/2017 Baldo Ash, [place]) -2.3 RFN: n/a LFN: n/a n/a  08/13/2017 -3.3 (-4.4%*) RFN: -2.0 LFN: -1.8 n/a  08/10/2015 -3.0 RFN: -2.0 LFN: -2.2 n/a   Other reports reviewed Per records brought by patient: L1-L4: 06/2013: -3.5 10/2007: -3.1 07/2005: -2.5 05/2003:  -2.2  RFN: 06/2013: -2.2 10/2007: -2.0 07/2005: -2.1 05/2003: -1.8  LFN: 06/2013: -2.2 10/2007: -2.1 07/2005: -2.2 05/2003: -1.9  She had 1 fracture: - 07/2016: Right rib  No recent falls or fractures.  Reviewed previous osteoporosis therapy: - Fosamax and Actonel - 2001-2004 (no help) - Estradiol 0.0125 mg + Progesterone - 2014-2016 (improvement in spine BMD) >> she  just started a combination of Estradiol-Prometrium-Testosterone 0.06-5-0.33 mg/gtt - 4 drops per day She previously refused other medications for osteoporosis. However, in 02/2021, she saw Dr. Marcelino Scot and he recommended again Evenity.  She would want to discuss this with me.  No history of vitamin D deficiency.  Reviewed previous vitamin D levels: new results pending Lab Results  Component Value Date   VD25OH 63.8 02/29/2020  07/14/2019: Vitamin D 57 01/27/2019: Vitamin D 50.7-on 5000 units vitamin D daily 03/29/2018: Vitamin D 82.5 09/2017: Vitamin D 72 06/17/2016: Vitamin D 35  She takes vitamin K2 +5000 units vitamin D daily.  She continues to exercise consistently.  She tried [place] >> got hurt - also had a HA that lasted 3 weeks.  She retried this earlier in 2021 and she again hurt herself and developed a headache.  No history of repeated steroid courses.  In 2018, she ruled out for multiple myeloma by protein electrophoresis.  She has polyclonal gammopathy and follows with hematology.  She was also investigated by nephrology for proteinuria.  Menopause was at 65 years old.  Pt does have a FH of osteoporosis: M - had spinal fx's, father - Lupron.  She was seeing Dr. Knox Saliva >> then switched to another functional medicine doctor. She was told she had mold in her house, low WBC, tested positive for heavy metals, iron is low (she had a recent low ferritin level, also - 01/2019).    No history of kidney stones, hyper or hypocalcemia or hyperparathyroidism: Lab Results  Component Value Date    CALCIUM 9.5 07/01/2020   CALCIUM 9.6 05/29/2020   CALCIUM 9.3 12/20/2019   CALCIUM 9.5 07/20/2019   CALCIUM 9.5 07/16/2019   CALCIUM 9.7 03/03/2019   CALCIUM 9.2 08/22/2018   CALCIUM 9.4 03/20/2018   CALCIUM 9.2 01/19/2018   CALCIUM 9.2 12/30/2017  02/03/2019: calcium 9.7 03/29/2018: PTH 48  No history of CKD.  Latest BUN/creatinine: New results pending Lab Results  Component Value Date   BUN 14 07/01/2020   CREATININE 0.74 07/01/2020   Hypothyroidism.  She was initially on Nature-Throid, then she had to switch to Armour due to [employer].  She noticed some hair loss after switching, but no other symptoms.  In 2019, she was taken off the medication by one of her providers but she felt terrible and restarted.  Reviewed her TFTs: Lab Results  Component Value Date   TSH 1.65 07/12/2020  01/27/2019: TSH: 2.21 Lab Results  Component Value Date   TSH 2.78 06/30/2018  03/29/2018:  TSH 3.17, free T4 0.75, free T3 3.0 02/10/2018: TSH 1.89, free T4 0.9, free T3 2.2 11/04/2017: TSH 2.74, free T4 0.59, free T3 5.2 08/14/2017: TSH 1.780, fT4 1.08 - pt was on Biotin 10,000 mcg when labs drawn; Selenium and Urinary iodine normal 07/19/2008: TSH 6.76  On Armour 30 mg 5/7 days and 15 mg 2/7 days, reduced 03/2020 by her Integrative Med Provider - in am - fasting - at least 30 min from b'fast - no calcium - no iron - no multivitamins - no PPIs - not on Biotin  She is on multiple other supplements, including alpha-lipoic acid, co-Q10, 5 HTP, vitamin B12, probiotics, magnesium, etc. We stopped her vitamin A since this was found to exacerbate osteoporosis. She stopped the supplement and also her multivitamins due to elevated B6 vitamin level.  At last check, on 02/10/2018, her vitamin B6 was 10.9 (2.0-32.8), normal. In 2019, she  went to the emergency room for chest pain after she started a natural supplement (nitric balance-ATP), which she stopped since.  Elevated  estradiol:  Reviewed history: She had previously undetectable estradiol levels in 07/25/2013, 08/21/2014, 05/05/2016, 06/03/2016, however, in 04/25/2017, her level was 44.3 pg/mL.  At that time, she was giving estriol to her dog without gloves.  She started to use gloves and her level became undetectable again.  She then started to reuse the same gloves and in 03/23/2018, the level was 197.8 pg/mL.  She started to use gloves only once for administration and her level decreased to 101.8 on 03/29/2018.  She was very worried about this despite reassurance that the pattern of estrogen increase was consistent with interference with the assay rather than a tumoral source. She had imaging tests that initially showed an ovarian cyst which resolved. The elevated estrogen was deemed to be due to interference with the assay from her high-dose biotin.  She has a history of pernicious anemia and IDA.  She got iron infusions:  Ferritin 17 >> 50.  More recent ferritin was also normal.  She has a stable 8 mm pancreatic cyst-no intervention needed, only repeat MRI for follow-up.  She continues to have chronic fatigue.  She saw infectious disease.  ROS: + See HPI  Past Medical History:  Diagnosis Date   Allergic rhinitis    Allergy    Aneurysm of cardiac wall, congenital    a.  septal aneurysm extending into the RVOT (by cardiac MRI in 08/2013)   Anxiety    Aortic atherosclerosis (HCC)    Arthritis    Asthma    Basal cell carcinoma    Depression    Heart murmur    Hypothyroidism    on meds   Leukopenia    idiopathic   Lyme disease    Mitral valve prolapse    MVP (mitral valve prolapse)    Orthostatic hypotension    Osteoporosis    Pancreatic cyst    Peripheral neuropathy 06/09/2012   Pernicious anemia    Pernicious anemia    Pernicious anemia    Plantar fasciitis    Pneumothorax    Past Surgical History:  Procedure Laterality Date   COLONOSCOPY  2019   JMP-   COLONOSCOPY WITH PROPOFOL N/A  02/22/2013   Procedure: COLONOSCOPY WITH PROPOFOL;  Surgeon: Garlan Fair, MD;  Location: [facility];  Service: Endoscopy;  Laterality: N/A;   MOUTH SURGERY  01/2012   bone graft in mouth   NASAL SINUS SURGERY Right 06/12/2014   Procedure: RIGHT ENDOSCOPIC MAXILLARY ANTROSTOMY;  Surgeon: Ascencion Dike, MD;  Location: [facility];  Service: ENT;  Laterality: Right;   ROOT CANAL  02/27/2012   TONSILLECTOMY  1962   WISDOM TOOTH EXTRACTION     Social History   Socioeconomic History   Marital status: Divorced    Spouse name: Not on file   Number of children: 0   Years of education: Not on file   Highest education level: Not on file  Occupational History    Comment: Career and life coach  Tobacco Use   Smoking status: Never   Smokeless tobacco: Never  Vaping Use   Vaping Use: Never used  Substance and Sexual Activity   Alcohol use: No    Alcohol/week: 0.0 standard drinks   Drug use: No   Sexual activity: Not on file  Other Topics Concern   Not on file  Social History Narrative   Patient is single and lives alone.   Patient is self-employed, career and life coaching.   Patient drinks two to four cups of caffeine daily.   Right handed    Social Determinants of Health   Financial Resource Strain: Not on file  Food Insecurity: Not on file  Transportation Needs: Not on file  Physical Activity: Not on file  Stress: Not on file  Social Connections: Not on file  Intimate Partner Violence: Not on file   Current Outpatient Medications on File Prior to Visit  Medication Sig Dispense Refill   5-Hydroxytryptophan (5-HTP) 100 MG CAPS Take 100 mg by mouth daily.      albuterol (VENTOLIN HFA) 108 (90 Base) MCG/ACT inhaler Inhale 1 puff into the lungs daily as needed for wheezing or shortness of breath.      ARMOUR THYROID 30 MG tablet Take by mouth daily. Patient taking 30 MG 5 days a week and 15 MG 2 days.  6   Ascorbic Acid (VITAMIN C PO) Take 2,500 Units by mouth  daily.      Biotin 10000 MCG TABS Take 10,000 mcg by mouth daily.     Cholecalciferol (VITAMIN D3) 10000 units capsule Take 10,000 Units by mouth daily.      clonazePAM (KLONOPIN) 1 MG tablet Take 1 mg by mouth daily as needed for anxiety.      Cyanocobalamin (VITAMIN B-12 ER PO) Take 5,000 mcg by mouth daily.      fluconazole (DIFLUCAN) 150 MG tablet Take by mouth.     MAGNESIUM OXIDE 400 PO Take by mouth.     Menaquinone-7 (VITAMIN K2 PO) Take 1 tablet by mouth daily.     NON FORMULARY Take 1,000 mcg by mouth daily. Methyl Folate chewable tablet     NONFORMULARY OR COMPOUNDED ITEM Apply 2 drops topically in the morning. Estradiol-Progesterone 0.06-5-0.33 MG liquid drops     Omega-3 Fatty Acids (FISH OIL) 1000 MG CAPS daily as needed.     vitamin A 3 MG (10000 UNITS) capsule Take 10,000 Units by mouth daily.     No current facility-administered medications on file prior to visit.   Allergies  Allergen Reactions   Augmentin [Amoxicillin-Pot Clavulanate] Diarrhea   Avelox [Moxifloxacin] Other (See Comments)    neuropathy   Clindamycin/Lincomycin Diarrhea   Iron     Other reaction(s): severe constipation   Family History  Problem Relation Age of Onset   Hyperlipidemia Father    Heart failure Father    Prostate cancer Father 76   Kidney failure Father    Hypertension Father    Colon  polyps Father    Angina Mother    Hypertension Mother    Lung cancer Mother 59       again at 75/79-smoker   Colon cancer Mother 70   Colon polyps Mother    Lung cancer Paternal Grandfather    Breast cancer Paternal Grandmother    Diabetes Paternal Grandmother    Esophageal cancer Neg Hx    Liver cancer Neg Hx    Pancreatic cancer Neg Hx    Rectal cancer Neg Hx    Stomach cancer Neg Hx     PE: BP 100/70 (BP Location: Right Arm, Patient Position: Sitting, Cuff Size: Normal)   [name] 71   Ht _0  (1.753 m)   Wt 126 lb 12.8 oz (57.5 kg)   SpO2 97%   BMI 18.73 kg/m  Wt Readings from Last 3  Encounters:  03/06/21 126 lb 12.8 oz (57.5 kg)  01/07/21 127 lb 6.4 oz (57.8 kg)  11/15/20 127 lb (57.6 kg)   Constitutional: normal weight, in NAD Eyes: PERRLA, EOMI, no exophthalmos ENT: moist mucous membranes, no thyromegaly, no cervical lymphadenopathy Cardiovascular: RRR, No MRG Respiratory: CTA B Gastrointestinal: abdomen soft, NT, ND, BS+ Musculoskeletal: no deformities, strength intact in all 4 Skin: moist, warm, no rashes Neurological: no tremor with outstretched hands, DTR normal in all 4  Assessment: 1. Osteoporosis  2.  Hypothyroidism  3.  Perceived hypoglycemia  4. Elevated estrogen -This was due to biotin interference with the assay -No further follow-up is needed for this  5.  Hair loss  Other treating physicians:  Dr. Ronita Hipps  Dr. August Luz [facility] [place] [address]  Plan: 1. Osteoporosis -Likely age-related/postmenopausal and she also has a family history of osteoporosis.  She feels that the fact that she was previously drinking a lot of coffee also contributed. -No falls or fractures since last visit -Reviewed her DXA scan reports from 2018, 2020 and 2021-stable at the spine, however, decreased T-scores at the right and left femoral neck on the last report.  At today's visit, she brings her newest bone density report obtained by Dr. Marcelino Scot and this showed worsening spine BMD, at -3.9, and also worse right femoral neck T-scores.  At the level of the 33% distal radius, she has a very low T score, of -4.5.  We discussed that these translate into a significantly increased risk for fracture.  She is also prone to falls due to her peripheral neuropathy. -She previously refused antiresorptive medications and anabolic agents.  She is also seeing a bone specialist, who advised her to consider Evenity and we discussed about this in detail at the previous visit.  At this visit, she mentions that Dr. Marcelino Scot again recommended Evenity.  She  asks me again my opinion about this.  Since she does not have cardiovascular history and since her bone density was so low, I would agree that she needs bone anabolic therapy.  Discussed at length about benefits and possible side effects from [place] and what she should expect from therapy.  We also discussed about how to continue treatment after she finishes the [place] year and different options with benefits and side effects.  She can get the Evenity through our office if she decides to do this.  She mentions that she checked with her insurance and Tymlos is extremely expensive.  If Evenity is not covered, we can try Forteo.  -She continues on many supplements for bone maintenance, originally started by her integrative  medicine provider.  In the past, we stopped vitamin A since excess of this can cause osteoporosis and increased fractures. -She did try to go to the [place] [facility] but she developed muscle aches and headaches and stopped.  She retried this with the same results earlier last year -She continues to exercise and we discussed about moving the exercise after meals, rather than before meals, walking more downhill compared to uphill and using weights when walking.  She continues to walk daily. -She is on HRT by another provider (compounded hormonal supplement including estrogen, testosterone, progesterone).  She feels much better on this.  She also continues vibration platform and weightbearing and balance exercises. -Vitamin D level was normal again 06/2020, at 49.  She had another vitamin D level obtained earlier this month and she was sent me the records from Dr. Marcelino Scot.  She also sent me her basic metabolic panel results from then. -Continues 5000 units vitamin D + vitamin K 2 daily -I will see her back in 1 year  2.  Perceived hypothyroidism -She was started on thyroid hormones based on symptoms, but without a clear history of hypothyroidism.  Since her TFTs remained normal afterwards,  she continues supplementation.  one of the other specialist that she is seeing wanted to take her thyroid hormones, but she felt poorly restarted. -Reviewed latest TSH and this was normal: Lab Results  Component Value Date   TSH 1.65 07/12/2020  - she continues on Armour 30 mg 5 out of 7 days and 15 mg 2 out of 7 days -She feels good on this dose - we discussed about taking the thyroid hormone every day, with water, >30 minutes before breakfast, separated by >4 hours from acid reflux medications, calcium, iron, multivitamins. Pt. is taking it correctly. - will check thyroid tests today: TSH, free T3 and fT4 - If labs are abnormal, she will need to return for repeat TFTs in 1.5 months  3.  Hypoglycemia -Likely with a component of idiopathic postprandial syndrome (symptoms of hypoglycemia but without clear hypoglycemia) -She has a history of low blood sugars in 06/2020 after 3 weeks in which she could not eat well after periodontal surgery.  She is normally eating a plant-based diet and at that time, she was not able to eat vegetables, only pured or liquid foods.  Most likely her mild hypoglycemia at that time was caused by decreased intake.  Since she was concerned about what she had to eat, I referred her to nutrition but her insurance did not cover the appointment and it was very expensive ($400). -At last visit I advised her against a CGM since this would have overestimated her lows and cause unnecessary anxiety.  Her latest HbA1c was 4.9% in 06/2020.  I did advise her to only check blood sugars when she feels poorly.  I also advised that about several dietary changes including adding breakfast, staying with low glycemic index foods, eating fruit and veggies, not drinking liquids with meals, starting the meal with protein and fat and ending with carbs, and also carrying a snack with her everywhere.  I also gave her written instructions about the 15-15 rule hyperglycemia correction.  At last visit,  she was spending a significant amount of time calculating exactly how many carbs, protein, and fats she was eating and also documenting her blood sugars multiple times a day.  I advised her that none of this is necessary. -However, she mentions that she did see another provider and had a CGM  attached since then.  Sugars were low normal and the CGM was stopped.  No further hypoglycemic episodes now that she eats normally. -Since last visit another provider ordered a cosyntropin stimulation test for her (02/05/2021) and this was normal. -We will check an HbA1c today  4. See above  5.  Hair loss -Worsened -Per her request, we will check a B12 level today along with a CBC to see if these may contribute to her increased hair loss.  - Total time spent for the visit: 40 minutes, in obtaining medical information from the chart, from the patient, and from records brought by her at today's visit, reviewing her  previous labs, imaging evaluations, and treatments, reviewing her symptoms, counseling her about her conditions (please see the discussed topics above), and developing a plan to further investigate and treat them; she had a number of questions which I addressed.  Component     Latest Ref Rng & Units 03/06/2021  WBC     4.0 - 10.5 K/uL 3.0 (L)  RBC     3.87 - 5.11 Mil/uL 4.54  Hemoglobin     12.0 - 15.0 g/dL 14.7  HCT     36.0 - 46.0 % 42.1  MCV     78.0 - 100.0 fl 92.8  MCH     26.0 - 34.0 pg   MCHC     30.0 - 36.0 g/dL 34.8  RDW     11.5 - 15.5 % 12.9  Platelets     150.0 - 400.0 K/uL 166.0  Triiodothyronine,Free,Serum     2.3 - 4.2 pg/mL 5.5 (H)  T4,Free(Direct)     0.60 - 1.60 ng/dL 0.74  TSH     0.35 - 5.50 uIU/mL 2.60  Vitamin B12     211 - 911 pg/mL >1550 (H)  Hemoglobin A1C     4.6 - 6.5 % 5.1   Low WBC, consistent with prior.  No anemia.  TFTs at goal.  B12 elevated -I would suggest to decrease her supplement.  HbA1c excellent.  Philemon Kingdom, MD PhD [facility]  Endocrinology

## 2021-03-07 ENCOUNTER — Encounter: Payer: Self-pay | Admitting: Internal Medicine

## 2021-03-11 ENCOUNTER — Encounter: Payer: Self-pay | Admitting: Podiatry

## 2021-03-11 ENCOUNTER — Ambulatory Visit (INDEPENDENT_AMBULATORY_CARE_PROVIDER_SITE_OTHER): Payer: Medicare Other | Admitting: Podiatry

## 2021-03-11 ENCOUNTER — Other Ambulatory Visit: Payer: Self-pay

## 2021-03-11 DIAGNOSIS — M79674 Pain in right toe(s): Secondary | ICD-10-CM

## 2021-03-11 DIAGNOSIS — L600 Ingrowing nail: Secondary | ICD-10-CM | POA: Diagnosis not present

## 2021-03-11 DIAGNOSIS — B351 Tinea unguium: Secondary | ICD-10-CM

## 2021-03-13 NOTE — Progress Notes (Signed)
Subjective: 65 year old female presents the office with concerns of right toenail issue.  She is noticing discoloration along the medial mostly lateral aspect.  She has some discomfort in the nail corner intermittently.  No swelling or redness or any drainage.  She was told previously by another provider that she should not have a partial nail removal for concerns of not healing.  She did follow with Dr. Paulla Dolly and was given to the partial nail avulsion but she was hesitant to do this.  Also states that she does have neuropathy for 10 years without any underlying cause that she can report.  She also has AVN on her right foot and she is again concerned about circulation.  She has followed up with vascular surgery as well.  Denies any systemic complaints such as fevers, chills, nausea, vomiting. No acute changes since last appointment, and no other complaints at this time.   Objective: AAO x3, NAD DP/PT pulses palpable bilaterally, CRT less than 3 seconds The right hallux nail is dark discoloration with yellow-brown discoloration most on the border of the nail, lateral aspect.  There is mild incurvation there is some localized edema there is no erythema or warmth.  There is no ascending cellulitis there is no drainage or pus.  No fluctuation crepitation. There is no area of pinpoint tenderness.  There is no edema, erythema otherwise.  MMT 5/5. No open lesions or pre-ulcerative lesions.  No pain with calf compression, swelling, warmth, erythema  Assessment: Likely onychomycosis with ingrowing nail  Plan: -All treatment options discussed with the patient including all alternatives, risks, complications.  -We discussed various treatment options including conservative versus surgical options.  Discussed treatment options for the discoloration of the nail as well.  I took a sample in the office of this to [facility] for evaluation of fungus versus dystrophy of the nail.  Discussed medications including vitamin E  oil that she can start for now.  Discussed cutting the nail straight across.  If needed she can do Epson salt soaks. -Patient encouraged to call the office with any questions, concerns, change in symptoms.   Trula Slade DPM

## 2021-03-19 ENCOUNTER — Encounter: Payer: Self-pay | Admitting: Podiatry

## 2021-04-01 ENCOUNTER — Inpatient Hospital Stay: Payer: Medicare Other

## 2021-04-01 ENCOUNTER — Telehealth: Payer: Self-pay | Admitting: Hematology

## 2021-04-01 ENCOUNTER — Inpatient Hospital Stay: Payer: Medicare Other | Admitting: Hematology

## 2021-04-01 NOTE — Telephone Encounter (Signed)
Left message with today's rescheduled appointment due to provider's emergency.

## 2021-04-12 ENCOUNTER — Other Ambulatory Visit: Payer: Self-pay

## 2021-04-12 ENCOUNTER — Emergency Department (HOSPITAL_BASED_OUTPATIENT_CLINIC_OR_DEPARTMENT_OTHER)
Admission: EM | Admit: 2021-04-12 | Discharge: 2021-04-12 | Disposition: A | Discharge status: Home / Self Care | Payer: Medicare Other | Attending: Emergency Medicine | Admitting: Emergency Medicine

## 2021-04-12 ENCOUNTER — Emergency Department (HOSPITAL_BASED_OUTPATIENT_CLINIC_OR_DEPARTMENT_OTHER): Payer: Medicare Other

## 2021-04-12 ENCOUNTER — Encounter (HOSPITAL_BASED_OUTPATIENT_CLINIC_OR_DEPARTMENT_OTHER): Payer: Self-pay | Admitting: Emergency Medicine

## 2021-04-12 DIAGNOSIS — Z85828 Personal history of other malignant neoplasm of skin: Secondary | ICD-10-CM | POA: Insufficient documentation

## 2021-04-12 DIAGNOSIS — R519 Headache, unspecified: Secondary | ICD-10-CM | POA: Insufficient documentation

## 2021-04-12 DIAGNOSIS — T50995A Adverse effect of other drugs, medicaments and biological substances, initial encounter: Secondary | ICD-10-CM | POA: Insufficient documentation

## 2021-04-12 DIAGNOSIS — D709 Neutropenia, unspecified: Secondary | ICD-10-CM | POA: Diagnosis not present

## 2021-04-12 DIAGNOSIS — R5383 Other fatigue: Secondary | ICD-10-CM | POA: Diagnosis not present

## 2021-04-12 DIAGNOSIS — X58XXXA Exposure to other specified factors, initial encounter: Secondary | ICD-10-CM | POA: Diagnosis not present

## 2021-04-12 DIAGNOSIS — R109 Unspecified abdominal pain: Secondary | ICD-10-CM | POA: Diagnosis not present

## 2021-04-12 DIAGNOSIS — E876 Hypokalemia: Secondary | ICD-10-CM

## 2021-04-12 DIAGNOSIS — J45909 Unspecified asthma, uncomplicated: Secondary | ICD-10-CM | POA: Diagnosis not present

## 2021-04-12 DIAGNOSIS — M7989 Other specified soft tissue disorders: Secondary | ICD-10-CM | POA: Diagnosis not present

## 2021-04-12 DIAGNOSIS — E039 Hypothyroidism, unspecified: Secondary | ICD-10-CM | POA: Diagnosis not present

## 2021-04-12 DIAGNOSIS — R079 Chest pain, unspecified: Secondary | ICD-10-CM

## 2021-04-12 DIAGNOSIS — T50905A Adverse effect of unspecified drugs, medicaments and biological substances, initial encounter: Secondary | ICD-10-CM

## 2021-04-12 DIAGNOSIS — Z79899 Other long term (current) drug therapy: Secondary | ICD-10-CM | POA: Insufficient documentation

## 2021-04-12 LAB — BASIC METABOLIC PANEL
Anion gap: 8 (ref 5–15)
BUN: 16 mg/dL (ref 8–23)
CO2: 28 mmol/L (ref 22–32)
Calcium: 9 mg/dL (ref 8.9–10.3)
Chloride: 106 mmol/L (ref 98–111)
Creatinine, Ser: 0.68 mg/dL (ref 0.44–1.00)
GFR, Estimated: >60 mL/min (ref >60)
Glucose, Bld: 100 mg/dL — ABNORMAL HIGH (ref 70–99)
Potassium: 3.1 mmol/L — ABNORMAL LOW (ref 3.5–5.1)
Sodium: 142 mmol/L (ref 135–145)

## 2021-04-12 LAB — D-DIMER, QUANTITATIVE: D-Dimer, Quant: 0.31 ug/mL-FEU (ref 0.00–0.50)

## 2021-04-12 LAB — CBC
HCT: 43.6 % (ref 36.0–46.0)
Hemoglobin: 14.4 g/dL (ref 12.0–15.0)
MCH: 31.2 pg (ref 26.0–34.0)
MCHC: 33 g/dL (ref 30.0–36.0)
MCV: 94.6 fL (ref 80.0–100.0)
Platelets: 175 10*3/uL (ref 150–400)
RBC: 4.61 MIL/uL (ref 3.87–5.11)
RDW: 12.5 % (ref 11.5–15.5)
WBC: 2.6 10*3/uL — ABNORMAL LOW (ref 4.0–10.5)
nRBC: 0 % (ref 0.0–0.2)

## 2021-04-12 LAB — TROPONIN I (HIGH SENSITIVITY)
Troponin I (High Sensitivity): 2 ng/L (ref <18)
Troponin I (High Sensitivity): 2 ng/L (ref <18)

## 2021-04-12 MED ORDER — SODIUM CHLORIDE 0.9 % IV BOLUS
1000.0000 mL | Freq: Once | INTRAVENOUS | Status: AC
  Administered 2021-04-12: 1000 mL via INTRAVENOUS

## 2021-04-12 MED ORDER — POTASSIUM CHLORIDE CRYS ER 20 MEQ PO TBCR
40.0000 meq | EXTENDED_RELEASE_TABLET | Freq: Once | ORAL | Status: AC
  Administered 2021-04-12: 40 meq via ORAL
  Filled 2021-04-12: qty 2

## 2021-04-12 NOTE — Discharge Instructions (Addendum)
Follow-up with your primary care doctor on Monday for recheck.  Your white blood cell count was slightly low.  This can be followed by your hematologist.  Your potassium is slightly low.  This can be followed by your primary care doctor.  Return to the emergency room as needed for any worsening symptoms.

## 2021-04-12 NOTE — ED Provider Notes (Signed)
[facility] DEPT Provider Note   CSN: [id number] Arrival date & time: 04/12/21  1738     History Chief Complaint  Patient presents with   Abdominal Pain   Chest Pain    Megan [name] is a 65 y.o. female.  Patient is a 65 year old female who presents with chest pain and overall feeling bad.  10 days ago she got an Evenity shot for osteoporosis.  She said that the first time she has had that.  Since that time she has been feeling overall bad.  She has had some fatigue and some chills and headache.  She has had some abdominal discomfort with large amounts of stool although she does not have diarrhea.  No vomiting.  She has had a little bit of lightheadedness but no vertiginous symptoms.  Yesterday she started having some chest pain.  This on the left side of her chest.  Its nonradiating.  She has no associated shortness of breath other than when she was walking today she felt like she could not get a completely deep breath although she did not really describe it shortness of breath.  Given that she had had some chest pain, her doctor who prescribed the Infinity recommended that she get evaluated in the ED because it does have a black box warning for strokes and heart attacks.  She denies any numbness or weakness to her extremities.  No balance issues.  No speech deficits or vision changes.  She has had some minor swelling to her lower extremities bilaterally for the last few weeks and some calf pain bilaterally.  She describes as a muscle cramping.      Past Medical History:  Diagnosis Date   Allergic rhinitis    Allergy    Aneurysm of cardiac wall, congenital    a.  septal aneurysm extending into the RVOT (by cardiac MRI in 08/2013)   Anxiety    Aortic atherosclerosis (HCC)    Arthritis    Asthma    Basal cell carcinoma    Depression    Heart murmur    Hypothyroidism    on meds   Leukopenia    idiopathic   Lyme disease    Mitral valve prolapse    MVP  (mitral valve prolapse)    Orthostatic hypotension    Osteoporosis    Pancreatic cyst    Peripheral neuropathy 06/09/2012   Pernicious anemia    Pernicious anemia    Pernicious anemia    Plantar fasciitis    Pneumothorax     Patient Active Problem List   Diagnosis Date Noted   Major depressive disorder, single episode, unspecified 01/10/2021   Abnormality of tongue 08/14/2020   Anxiety state 08/14/2020   Dizziness 08/14/2020   Granuloma annulare 08/14/2020   Hardening of the aorta (main artery of the heart) ([place]) 08/14/2020   History of gastrointestinal disease 08/14/2020   Hypercholesterolemia 08/14/2020   Hypoglycemia 08/14/2020   Ocular rosacea 08/14/2020   Osteoporosis 08/14/2020   Primary osteoarthritis, left ankle and foot 08/14/2020   Sinus of Valsalva aneurysm 08/14/2020   Skin cancer 08/14/2020   Skin sensation disturbance 08/14/2020   Bilateral ankle joint pain 12/02/2019   Lyme disease 10/24/2019   Hypotrichosis 07/28/2019   Iron deficiency anemia 07/14/2019   Other proteinuria 12/01/2018   Pernicious anemia 09/08/2018   Vitamin D deficiency, unspecified 07/30/2018   Age-related osteoporosis without current pathological fracture 07/30/2018   Estrogen excess 06/18/2018   Hypogammaglobulinemia ([place]) 01/11/2018   Right  hip pain 12/30/2017   Bilateral ankle pain 11/09/2017   Hypothyroidism 10/27/2017   Hair loss 04/25/2017   Neuropathic pain of left lower extremity 04/22/2017   Great toe pain, left 04/10/2017   Precordial chest pain 12/17/2016   Aneurysm of right ventricle of heart 12/17/2016   Chronic fatigue 03/05/2016   Neuropathy associated with MGUS ([place]) 12/26/2013   Orthostatic hypotension 08/01/2013   Mitral valve prolapse 08/01/2013   Spider veins of limb 02/15/2013   Leukopenia 05/13/2012   Bilateral Dorsal Foot pain 04/23/2011   Gait abnormality 04/23/2011   Leg length inequality 04/23/2011   ASTHMA 11/16/2008   Asthma 11/16/2008    Nonspecific (abnormal) findings on radiological and other examination of body structure 07/24/2008   ABNORMAL CHEST XRAY 07/24/2008    Past Surgical History:  Procedure Laterality Date   COLONOSCOPY  2019   JMP-   COLONOSCOPY WITH PROPOFOL N/A 02/22/2013   Procedure: COLONOSCOPY WITH PROPOFOL;  Surgeon: Garlan Fair, MD;  Location: [facility];  Service: Endoscopy;  Laterality: N/A;   MOUTH SURGERY  01/2012   bone graft in mouth   NASAL SINUS SURGERY Right 06/12/2014   Procedure: RIGHT ENDOSCOPIC MAXILLARY ANTROSTOMY;  Surgeon: Ascencion Dike, MD;  Location: [facility];  Service: ENT;  Laterality: Right;   ROOT CANAL  02/27/2012   TONSILLECTOMY  1962   WISDOM TOOTH EXTRACTION       OB History   No obstetric history on file.     Family History  Problem Relation Age of Onset   Hyperlipidemia Father    Heart failure Father    Prostate cancer Father 25   Kidney failure Father    Hypertension Father    Colon polyps Father    Angina Mother    Hypertension Mother    Lung cancer Mother 48       again at 75/79-smoker   Colon cancer Mother 45   Colon polyps Mother    Lung cancer Paternal Grandfather    Breast cancer Paternal Grandmother    Diabetes Paternal Grandmother    Esophageal cancer Neg Hx    Liver cancer Neg Hx    Pancreatic cancer Neg Hx    Rectal cancer Neg Hx    Stomach cancer Neg Hx     Social History   Tobacco Use   Smoking status: Never   Smokeless tobacco: Never  Vaping Use   Vaping Use: Never used  Substance Use Topics   Alcohol use: No    Alcohol/week: 0.0 standard drinks   Drug use: No    Home Medications Prior to Admission medications   Medication Sig Start Date End Date Taking? Authorizing Provider  5-Hydroxytryptophan (5-HTP) 100 MG CAPS Take 100 mg by mouth daily.     [provider]  albuterol (VENTOLIN HFA) 108 (90 Base) MCG/ACT inhaler Inhale 1 puff into the lungs daily as needed for wheezing or shortness of  breath.  05/24/20   [provider]  ARMOUR THYROID 30 MG tablet Take by mouth daily. Patient taking 30 MG 5 days a week and 15 MG 2 days. 11/20/16   [provider]  Ascorbic Acid (VITAMIN C PO) Take 2,500 Units by mouth daily.     [provider]  Biotin 10000 MCG TABS Take 10,000 mcg by mouth daily.    [provider]  celecoxib (CELEBREX) 200 MG capsule Celebrex 200 mg capsule    [provider]  Cholecalciferol (VITAMIN D3) 10000 units capsule Take  10,000 Units by mouth daily.     [provider]  Cyanocobalamin (VITAMIN B-12 ER PO) Take 5,000 mcg by mouth daily.     [provider]  estradiol (ESTRACE) 0.1 MG/GM vaginal cream Place 1 Applicatorful vaginally 3 (three) times a week.    [provider]  heparin flush 10 UNIT/ML SOLN injection  01/31/21   [provider]  MAGNESIUM OXIDE 400 PO Take by mouth.    [provider]  Menaquinone-7 (VITAMIN K2 PO) Take 1 tablet by mouth daily.    [provider]  metroNIDAZOLE (METROGEL) 0.75 % vaginal gel Place vaginally at bedtime. 01/17/21   [provider]  NON FORMULARY Take 1,000 mcg by mouth daily. Methyl Folate chewable tablet    [provider]  NONFORMULARY OR COMPOUNDED ITEM Apply 2 drops topically in the morning. Estradiol-Progesterone 0.06-5-0.33 MG liquid drops    [provider]  Omega-3 Fatty Acids (FISH OIL) 1000 MG CAPS daily as needed.    [provider]  Romosozumab-aqqg (EVENITY) 105 MG/1.17ML SOSY injection  03/07/21   [provider]  vitamin A 3 MG (10000 UNITS) capsule Take 10,000 Units by mouth daily.    [provider]    Allergies    Augmentin [amoxicillin-pot clavulanate], Avelox [moxifloxacin], Clindamycin/lincomycin, and Iron  Review of Systems   Review of Systems  Constitutional:  Positive for chills and fatigue. Negative for diaphoresis and fever.  HENT:  Negative for  congestion, rhinorrhea and sneezing.   Eyes: Negative.   Respiratory:  Negative for cough, chest tightness and shortness of breath.   Cardiovascular:  Positive for chest pain. Negative for leg swelling.  Gastrointestinal:  Negative for abdominal pain, blood in stool, diarrhea, nausea and vomiting.  Genitourinary:  Negative for difficulty urinating, flank pain, frequency and hematuria.  Musculoskeletal:  Negative for arthralgias and back pain.  Skin:  Negative for rash.  Neurological:  Positive for light-headedness and headaches. Negative for dizziness, speech difficulty, weakness and numbness.   Physical Exam Updated Vital Signs BP 104/63   Pulse 62   Temp 98.3 F (36.8 C) (Oral)   Resp 18   Ht '5\' 9"'$  (1.753 m)   Wt 58.5 kg   SpO2 99%   BMI 19.05 kg/m   Physical Exam Constitutional:      Appearance: She is well-developed.  HENT:     Head: Normocephalic and atraumatic.  Eyes:     Pupils: Pupils are equal, round, and reactive to light.  Cardiovascular:     Rate and Rhythm: Normal rate and regular rhythm.     Heart sounds: Normal heart sounds.  Pulmonary:     Effort: Pulmonary effort is normal. No respiratory distress.     Breath sounds: Normal breath sounds. No wheezing or rales.  Chest:     Chest wall: No tenderness.  Abdominal:     General: Bowel sounds are normal.     Palpations: Abdomen is soft.     Tenderness: There is no abdominal tenderness. There is no guarding or rebound.  Musculoskeletal:        General: Normal range of motion.     Cervical back: Normal range of motion and neck supple.     Comments: Trace edema to lower extremities bilaterally, no calf tenderness  Lymphadenopathy:     Cervical: No cervical adenopathy.  Skin:    General: Skin is warm and dry.     Findings: No rash.  Neurological:     General: No focal  deficit present.     Mental Status: She is alert and oriented to person, place, and time.    ED Results / Procedures / Treatments    Labs (all labs ordered are listed, but only abnormal results are displayed) Labs Reviewed  BASIC METABOLIC PANEL - Abnormal; Notable for the following components:      Result Value   Potassium 3.1 (*)    Glucose, Bld 100 (*)    All other components within normal limits  CBC - Abnormal; Notable for the following components:   WBC 2.6 (*)    All other components within normal limits  D-DIMER, QUANTITATIVE  TROPONIN I (HIGH SENSITIVITY)  TROPONIN I (HIGH SENSITIVITY)    EKG EKG Interpretation  Date/Time:  Friday April 12 2021 17:45:37 EDT Ventricular Rate:  74 PR Interval:  150 QRS Duration: 80 QT Interval:  434 QTC Calculation: 481 R Axis:   93 Text Interpretation: Normal sinus rhythm Rightward axis Nonspecific ST abnormality Abnormal ECG since last tracing no significant change Confirmed by Malvin Johns [phone]) on 04/12/2021 7:37:30 PM  Radiology DG Chest Port 1 View  Result Date: 04/12/2021 CLINICAL DATA:  Headache chills EXAM: PORTABLE CHEST 1 VIEW COMPARISON:  08/22/2018 CT and chest x-ray FINDINGS: Interval diffuse interstitial opacity, uncertain chronicity but more apparent than compared with 2019. No focal opacity, pleural effusion or pneumothorax. Normal cardiac size. IMPRESSION: 1. No focal pulmonary opacity. 2. Diffusely increased interstitial opacity suggestive of chronic disease though difficult to exclude acute superimposed interstitial process. Electronically Signed   By: Donavan Foil M.D.   On: 04/12/2021 20:18    Procedures Procedures   Medications Ordered in ED Medications  sodium chloride 0.9 % bolus 1,000 mL (0 mLs Intravenous Stopped 04/12/21 2125)  potassium chloride SA (KLOR-CON) CR tablet 40 mEq (40 mEq Oral Given 04/12/21 2020)    ED Course  I have reviewed the triage vital signs and the nursing notes.  Pertinent labs & imaging results that were available during my care of the patient were reviewed by me and considered in my medical decision  making (see chart for details).    MDM Rules/Calculators/A&P                           Patient is a 65 year old female who presents with symptoms after an injection of medication for her osteoporosis.  She did have some chest pain.  No associated symptoms.  No exertional symptoms.  No ischemic changes were noted on EKG.  She has had 2 negative troponins.  Her other lab work is nonconcerning.  Her D-dimer is normal and no other suggestions of PE.  Her chest x-ray is clear without evidence of pneumonia or pneumothorax.  There was some interstitial markings which appear to be more chronic per the radiologist.  She does not have cough, congestion, shortness of breath or other symptoms that sound like an acute infection.  She does not have any abdominal tenderness on exam.  Her potassium was slightly low and she was given a dose of potassium replacement.  Her WBC count was low.  She has some chronically low numbers and is followed by hematologist.  She does not have a fever and does not look systemically ill.  She feels better after IV fluids in the ED.  She was discharged home in good condition.  She was advised to follow-up with her PCP on Monday for recheck.  Return precautions were given. Final Clinical Impression(s) /  ED Diagnoses Final diagnoses:  Adverse effect of drug, initial encounter  Hypokalemia  Neutropenia, unspecified type [facility])    Rx / DC Orders ED Discharge Orders     None        Malvin Johns, MD 04/12/21 2131

## 2021-04-12 NOTE — ED Triage Notes (Signed)
Patient sent from [facility] for further evaluation. Patient reports not feeling well for the past week. States on the 9th had first shot of eventy for osteoporosis. C/o headache, chills, abdominal pain and chest pain. States the chest pain started yesterday and describes it as intermittent and dull/ throbbing pain to left side. Had urinalysis and covid & flu test at [facility].

## 2021-04-17 ENCOUNTER — Other Ambulatory Visit: Payer: Self-pay | Admitting: Internal Medicine

## 2021-04-17 DIAGNOSIS — R9389 Abnormal findings on diagnostic imaging of other specified body structures: Secondary | ICD-10-CM

## 2021-04-19 ENCOUNTER — Other Ambulatory Visit: Payer: Self-pay

## 2021-04-19 DIAGNOSIS — D72819 Decreased white blood cell count, unspecified: Secondary | ICD-10-CM

## 2021-04-19 DIAGNOSIS — D508 Other iron deficiency anemias: Secondary | ICD-10-CM

## 2021-04-22 ENCOUNTER — Inpatient Hospital Stay (HOSPITAL_BASED_OUTPATIENT_CLINIC_OR_DEPARTMENT_OTHER): Payer: Medicare Other | Admitting: Hematology

## 2021-04-22 ENCOUNTER — Inpatient Hospital Stay: Payer: Medicare Other | Attending: Hematology

## 2021-04-22 ENCOUNTER — Encounter: Payer: Self-pay | Admitting: Hematology

## 2021-04-22 ENCOUNTER — Other Ambulatory Visit: Payer: Self-pay

## 2021-04-22 VITALS — BP 92/60 | HR 70 | Temp 98.0°F | Resp 16 | Ht 69.0 in | Wt 130.0 lb

## 2021-04-22 DIAGNOSIS — D510 Vitamin B12 deficiency anemia due to intrinsic factor deficiency: Secondary | ICD-10-CM

## 2021-04-22 DIAGNOSIS — D72819 Decreased white blood cell count, unspecified: Secondary | ICD-10-CM

## 2021-04-22 DIAGNOSIS — D696 Thrombocytopenia, unspecified: Secondary | ICD-10-CM | POA: Insufficient documentation

## 2021-04-22 DIAGNOSIS — D508 Other iron deficiency anemias: Secondary | ICD-10-CM

## 2021-04-22 DIAGNOSIS — M810 Age-related osteoporosis without current pathological fracture: Secondary | ICD-10-CM | POA: Insufficient documentation

## 2021-04-22 DIAGNOSIS — Z800 Family history of malignant neoplasm of digestive organs: Secondary | ICD-10-CM | POA: Insufficient documentation

## 2021-04-22 DIAGNOSIS — G629 Polyneuropathy, unspecified: Secondary | ICD-10-CM | POA: Diagnosis not present

## 2021-04-22 DIAGNOSIS — Z803 Family history of malignant neoplasm of breast: Secondary | ICD-10-CM | POA: Diagnosis not present

## 2021-04-22 DIAGNOSIS — Z85820 Personal history of malignant melanoma of skin: Secondary | ICD-10-CM | POA: Insufficient documentation

## 2021-04-22 DIAGNOSIS — Z801 Family history of malignant neoplasm of trachea, bronchus and lung: Secondary | ICD-10-CM | POA: Diagnosis not present

## 2021-04-22 LAB — CBC WITH DIFFERENTIAL (CANCER CENTER ONLY)
Abs Immature Granulocytes: 0 10*3/uL (ref 0.00–0.07)
Basophils Absolute: 0 10*3/uL (ref 0.0–0.1)
Basophils Relative: 1 %
Eosinophils Absolute: 0 10*3/uL (ref 0.0–0.5)
Eosinophils Relative: 1 %
HCT: 42 % (ref 36.0–46.0)
Hemoglobin: 13.8 g/dL (ref 12.0–15.0)
Immature Granulocytes: 0 %
Lymphocytes Relative: 27 %
Lymphs Abs: 0.8 10*3/uL (ref 0.7–4.0)
MCH: 31.4 pg (ref 26.0–34.0)
MCHC: 32.9 g/dL (ref 30.0–36.0)
MCV: 95.5 fL (ref 80.0–100.0)
Monocytes Absolute: 0.2 10*3/uL (ref 0.1–1.0)
Monocytes Relative: 9 %
Neutro Abs: 1.8 10*3/uL (ref 1.7–7.7)
Neutrophils Relative %: 62 %
Platelet Count: 161 10*3/uL (ref 150–400)
RBC: 4.4 MIL/uL (ref 3.87–5.11)
RDW: 12.2 % (ref 11.5–15.5)
WBC Count: 2.8 10*3/uL — ABNORMAL LOW (ref 4.0–10.5)
nRBC: 0 % (ref 0.0–0.2)

## 2021-04-22 LAB — CMP (CANCER CENTER ONLY)
ALT: 12 U/L (ref 0–44)
AST: 23 U/L (ref 15–41)
Albumin: 4.4 g/dL (ref 3.5–5.0)
Alkaline Phosphatase: 87 U/L (ref 38–126)
Anion gap: 9 (ref 5–15)
BUN: 15 mg/dL (ref 8–23)
CO2: 25 mmol/L (ref 22–32)
Calcium: 9 mg/dL (ref 8.9–10.3)
Chloride: 107 mmol/L (ref 98–111)
Creatinine: 0.71 mg/dL (ref 0.44–1.00)
GFR, Estimated: >60 mL/min (ref >60)
Glucose, Bld: 90 mg/dL (ref 70–99)
Potassium: 4.6 mmol/L (ref 3.5–5.1)
Sodium: 141 mmol/L (ref 135–145)
Total Bilirubin: 0.4 mg/dL (ref 0.3–1.2)
Total Protein: 6.6 g/dL (ref 6.5–8.1)

## 2021-04-22 LAB — VITAMIN B12: Vitamin B-12: 1079 pg/mL — ABNORMAL HIGH (ref 180–914)

## 2021-04-22 LAB — IRON AND TIBC
Iron: 74 ug/dL (ref 41–142)
Saturation Ratios: 23 % (ref 21–57)
TIBC: 320 ug/dL (ref 236–444)
UIBC: 246 ug/dL (ref 120–384)

## 2021-04-22 LAB — FERRITIN: Ferritin: 77 ng/mL (ref 11–307)

## 2021-04-22 NOTE — Progress Notes (Signed)
HEMATOLOGY/ONCOLOGY CLINIC NOTE  Date of Service: 04/22/2021  Patient Care Team: Lavone Orn, MD as PCP - General (Internal Medicine) Stanford Breed Denice Bors, MD as PCP - Cardiology (Cardiology) Annia Belt, MD as Consulting Physician (Oncology) Alda Berthold, DO as Consulting Physician (Neurology) Arnetha Gula, MD as Consulting Physician (Family Medicine) Gerda Diss, DO as Consulting Physician (Sports Medicine) Brunetta Genera, MD as Consulting Physician (Hematology) Alda Berthold, DO as Consulting Physician (Neurology)  CHIEF COMPLAINTS/PURPOSE OF CONSULTATION:  Chronic Idiopathic Neutropenia  HISTORY OF PRESENTING ILLNESS:   Megan [name] is a wonderful 65 y.o. female who has been referred to [place] by Dr. Murriel Hopper for evaluation and management of Chronic idiopathic neutropenia. The pt reports that she is doing well overall.   The pt reports a history of mold exposure, chronic mild neutropenia, and neuropathy. She denies frequent or abnormal infections.   The pt notes that she was first diagnosed with neuropathy in 2010, and has seen five neurologists. She endorses weakness and tingling in feet into mid-calf which has improved over the past 10 years. She has current weakness and concern for balance which limits her activity. She adds that the bottom of her feet occasionally become dark purple. She has had doppler studies and notes these ruled out vascular problems. She has seen holistic health practitioners in [place] in the past. She had a four out of five positives on western blot which revealed concerns for Lyme disease but apparently was not conclusive. She previously lived in [place]. She denies ever having a bulls eye rash.  The pt notes some difficulty with writing, which has not been progressive.   The pt has recently been worked up for elevated estrogen levels and a previous cyst, and was previously taking Biotin. She stopped taking  Boitin and has seen normalized estrogen levels. She notes that repeat MRI revealed the absence of the previously seen cyst  The pt also reports a history of polyclonal gammopathy.   She notes that her Ferritin was seen to be at 26 about 1.5 years ago. She has been a vegan and then began eating some meat protein. She also took PO iron replacement which was constipating. She also has taken Blood builder. Her Ferritin has recently decreased to a 10. Pt denies blood in the stools nor black stools. She had a colonoscopy about 1.5 years ago and notes this was unremarkable. She denies unexpected weight loss. She denies using acid suppressants. She endorses having pernicious anemia. Pt takes 5071mg Vitamin B12 daily. She is also taking 10k units Vitamin D3 five days a week.   The pt notes that she has osteoporosis, with L1-L4 with score of -3.5. She has been disinclined to pursue Prolia injections. She has been consuming almond milk and avoids dairy products. She took Fosamax three years ago.  Most recent lab results (08/22/18) of CBC is as follows: all values are WNL except for WBC at 3.5k.  On review of systems, pt reports neuropathy with weakness in feet, stable energy levels, constipation, and denies blood in the stools, black stools, abdominal pains, and any other symptoms.   On PMHx the pt reports neuropathy, pernicious anemia, osteoporosis.  INTERVAL HISTORY:   Megan HADYis a wonderful 65y.o. female who is here for evaluation and management of Chronic idiopathic neutropenia. The patient's last visit with [facility] on 05/29/2020. The pt reports that she is doing well overall.  The pt reports some mild  continued fatigue but no other acute new symptoms.   She notes no infection issues since her last clinic visit. No fevers no chills no night sweats no new bone pains no acute weight loss.  No new lumps or bumps.  Lab results today (04/22/21) of CBC w/diff and CMP is as follows:  WBC 2.8k with  ANC 1.8k. Ferritin at 77 Vitamin B12 at [place]:  Past Medical History:  Diagnosis Date   Allergic rhinitis    Allergy    Aneurysm of cardiac wall, congenital    a.  septal aneurysm extending into the RVOT (by cardiac MRI in 08/2013)   Anxiety    Aortic atherosclerosis (HCC)    Arthritis    Asthma    Basal cell carcinoma    Depression    Heart murmur    Hypothyroidism    on meds   Leukopenia    idiopathic   Lyme disease    Mitral valve prolapse    MVP (mitral valve prolapse)    Orthostatic hypotension    Osteoporosis    Pancreatic cyst    Peripheral neuropathy 06/09/2012   Pernicious anemia    Pernicious anemia    Pernicious anemia    Plantar fasciitis    Pneumothorax     SURGICAL HISTORY: Past Surgical History:  Procedure Laterality Date   COLONOSCOPY  2019   JMP-   COLONOSCOPY WITH PROPOFOL N/A 02/22/2013   Procedure: COLONOSCOPY WITH PROPOFOL;  Surgeon: Garlan Fair, MD;  Location: [facility];  Service: Endoscopy;  Laterality: N/A;   MOUTH SURGERY  01/2012   bone graft in mouth   NASAL SINUS SURGERY Right 06/12/2014   Procedure: RIGHT ENDOSCOPIC MAXILLARY ANTROSTOMY;  Surgeon: Ascencion Dike, MD;  Location: [place];  Service: ENT;  Laterality: Right;   ROOT CANAL  02/27/2012   TONSILLECTOMY  1962   WISDOM TOOTH EXTRACTION      SOCIAL HISTORY: Social History   Socioeconomic History   Marital status: Divorced    Spouse name: Not on file   Number of children: 0   Years of education: Not on file   Highest education level: Not on file  Occupational History    Comment: Career and life coach  Tobacco Use   Smoking status: Never   Smokeless tobacco: Never  Vaping Use   Vaping Use: Never used  Substance and Sexual Activity   Alcohol use: No    Alcohol/week: 0.0 standard drinks   Drug use: No   Sexual activity: Not on file  Other Topics Concern   Not on file  Social History Narrative   Patient is single and lives  alone.   Patient is self-employed, career and life coaching.   Patient drinks two to four cups of caffeine daily.   Right handed    Social Determinants of Health   Financial Resource Strain: Not on file  Food Insecurity: Not on file  Transportation Needs: Not on file  Physical Activity: Not on file  Stress: Not on file  Social Connections: Not on file  Intimate Partner Violence: Not on file    FAMILY HISTORY: Family History  Problem Relation Age of Onset   Hyperlipidemia Father    Heart failure Father    Prostate cancer Father 53   Kidney failure Father    Hypertension Father    Colon polyps Father    Angina Mother    Hypertension Mother    Lung cancer Mother 76  again at 75/79-smoker   Colon cancer Mother 75   Colon polyps Mother    Lung cancer Paternal Grandfather    Breast cancer Paternal Grandmother    Diabetes Paternal Grandmother    Esophageal cancer Neg Hx    Liver cancer Neg Hx    Pancreatic cancer Neg Hx    Rectal cancer Neg Hx    Stomach cancer Neg Hx     ALLERGIES:  is allergic to augmentin [amoxicillin-pot clavulanate], avelox [moxifloxacin], clindamycin/lincomycin, and iron.  MEDICATIONS:  Current Outpatient Medications  Medication Sig Dispense Refill   5-Hydroxytryptophan (5-HTP) 100 MG CAPS Take 100 mg by mouth daily.      albuterol (VENTOLIN HFA) 108 (90 Base) MCG/ACT inhaler Inhale 1 puff into the lungs daily as needed for wheezing or shortness of breath.      ARMOUR THYROID 30 MG tablet Take by mouth daily. Patient taking 30 MG 5 days a week and 15 MG 2 days.  6   Ascorbic Acid (VITAMIN C PO) Take 2,500 Units by mouth daily.      Biotin 10000 MCG TABS Take 10,000 mcg by mouth daily.     celecoxib (CELEBREX) 200 MG capsule Celebrex 200 mg capsule     Cholecalciferol (VITAMIN D3) 10000 units capsule Take 10,000 Units by mouth daily.      Cyanocobalamin (VITAMIN B-12 ER PO) Take 5,000 mcg by mouth daily.      estradiol (ESTRACE) 0.1 MG/GM  vaginal cream Place 1 Applicatorful vaginally 3 (three) times a week.     heparin flush 10 UNIT/ML SOLN injection      MAGNESIUM OXIDE 400 PO Take by mouth.     Menaquinone-7 (VITAMIN K2 PO) Take 1 tablet by mouth daily.     metroNIDAZOLE (METROGEL) 0.75 % vaginal gel Place vaginally at bedtime.     NON FORMULARY Take 1,000 mcg by mouth daily. Methyl Folate chewable tablet     NONFORMULARY OR COMPOUNDED ITEM Apply 2 drops topically in the morning. Estradiol-Progesterone 0.06-5-0.33 MG liquid drops     Omega-3 Fatty Acids (FISH OIL) 1000 MG CAPS daily as needed.     Romosozumab-aqqg (EVENITY) 105 MG/1.17ML SOSY injection      vitamin A 3 MG (10000 UNITS) capsule Take 10,000 Units by mouth daily.     No current facility-administered medications for this visit.    REVIEW OF SYSTEMS:   .10 Point review of Systems was done is negative except as noted above.   PHYSICAL EXAMINATION:  . Vitals:   04/22/21 0935  BP: 92/60  Pulse: 70  Resp: 16  Temp: 98 F (36.7 C)  SpO2: 100%   Filed Weights   04/22/21 0935  Weight: 130 lb (59 kg)   .Body mass index is 19.2 kg/m. [name] GENERAL:alert, in no acute distress and comfortable SKIN: no acute rashes, no significant lesions EYES: conjunctiva are pink and non-injected, sclera anicteric OROPHARYNX: MMM, no exudates, no oropharyngeal erythema or ulceration NECK: supple, no JVD LYMPH:  no palpable lymphadenopathy in the cervical, axillary or inguinal regions LUNGS: clear to auscultation b/l with normal respiratory effort HEART: regular rate & rhythm ABDOMEN:  normoactive bowel sounds , non tender, not distended. Extremity: no pedal edema PSYCH: alert & oriented x 3 with fluent speech NEURO: no focal motor/sensory deficits   LABORATORY DATA:  I have reviewed the data as listed  . CBC Latest Ref Rng & Units 04/22/2021 04/12/2021 03/06/2021  WBC 4.0 - 10.5 K/uL 2.8(L) 2.6(L) 3.0(L)  Hemoglobin 12.0 - 15.0  g/dL 13.8 14.4 14.7  Hematocrit  36.0 - 46.0 % 42.0 43.6 42.1  Platelets 150 - 400 K/uL 161 175 166.0    . CMP Latest Ref Rng & Units 04/12/2021 07/01/2020 06/29/2020  Glucose 70 - 99 mg/dL 100(H) 128(H) -  BUN 8 - 23 mg/dL 16 14 -  Creatinine 0.44 - 1.00 mg/dL 0.68 0.74 -  Sodium 135 - 145 mmol/L 142 138 -  Potassium 3.5 - 5.1 mmol/L 3.1(L) 3.7 -  Chloride 98 - 111 mmol/L 106 102 -  CO2 22 - 32 mmol/L 28 27 -  Calcium 8.9 - 10.3 mg/dL 9.0 9.5 -  Total Protein 6.5 - 8.1 g/dL - 6.5 6.6  Total Bilirubin 0.3 - 1.2 mg/dL - 0.7 0.6  Alkaline Phos 38 - 126 U/L - 70 96  AST 15 - 41 U/L - 37 34  ALT 0 - 44 U/L - 24 26   . Lab Results  Component Value Date   IRON 74 04/22/2021   TIBC 320 04/22/2021   IRONPCTSAT 23 04/22/2021   (Iron and TIBC)  Lab Results  Component Value Date   FERRITIN 77 04/22/2021     RADIOGRAPHIC STUDIES: I have personally reviewed the radiological images as listed and agreed with the findings in the report.  ASSESSMENT & PLAN:   65 y.o. female with  1. Chronic idiopathic leucopenia. WBC count today low normal @ 2.8k ANC 1800 ( no neutropenia) 2. Pernicious anemia -12/30/17 Antiparietal cell antibodies which were high at 105.9  PLAN: -Pt's leukopenia has been mild over the last several years and non-progressive. ANC wnl -Continue SL Vitamin B12 - levels adequate. -Continue empiric Vitamin B complex -ferritin adequate -- no indications for IV Iron at this time.   FOLLOW UP: RTC with Dr Irene Limbo with labs in 12 months  . The total time spent in the appointment was 20 minutes and more than 50% was on counseling and direct patient cares.   All of the patient's questions were answered with apparent satisfaction. The patient knows to call the clinic with any problems, questions or concerns.    Sullivan Lone MD [place] AAHIVMS [facility] Hematology/Oncology Physician [facility]  (Office):       [phone] (Work cell):  [phone] (Fax):           [phone]

## 2021-04-25 ENCOUNTER — Ambulatory Visit
Admission: RE | Admit: 2021-04-25 | Discharge: 2021-04-25 | Disposition: A | Discharge status: Home / Self Care | Payer: Medicare Other | Source: Ambulatory Visit | Attending: Internal Medicine | Admitting: Internal Medicine

## 2021-04-25 ENCOUNTER — Other Ambulatory Visit: Payer: Self-pay

## 2021-04-25 DIAGNOSIS — R9389 Abnormal findings on diagnostic imaging of other specified body structures: Secondary | ICD-10-CM

## 2021-04-29 ENCOUNTER — Encounter: Payer: Self-pay | Admitting: Hematology

## 2021-04-30 ENCOUNTER — Encounter: Payer: Self-pay | Admitting: Hematology

## 2021-04-30 ENCOUNTER — Other Ambulatory Visit: Payer: Self-pay | Admitting: Hematology

## 2021-04-30 ENCOUNTER — Telehealth: Payer: Self-pay

## 2021-04-30 ENCOUNTER — Other Ambulatory Visit: Payer: Medicare Other

## 2021-04-30 NOTE — Telephone Encounter (Signed)
Contacted pt to follow-up on MyChart message regarding ferritin. Offered pt to come in for 1 more dose of IV Injectafer . Pt wants to wait and have Ferritin level done in January 2023. Lab appointment scheduled . Pt aware.

## 2021-05-07 ENCOUNTER — Other Ambulatory Visit: Payer: Medicare Other

## 2021-05-10 ENCOUNTER — Other Ambulatory Visit: Payer: Self-pay

## 2021-05-10 ENCOUNTER — Ambulatory Visit (INDEPENDENT_AMBULATORY_CARE_PROVIDER_SITE_OTHER): Payer: Medicare Other | Admitting: Student

## 2021-05-10 ENCOUNTER — Encounter: Payer: Self-pay | Admitting: Student

## 2021-05-10 VITALS — BP 106/66 | HR 71 | Temp 98.0°F | Ht 69.0 in | Wt 131.0 lb

## 2021-05-10 DIAGNOSIS — J219 Acute bronchiolitis, unspecified: Secondary | ICD-10-CM

## 2021-05-10 DIAGNOSIS — R0781 Pleurodynia: Secondary | ICD-10-CM | POA: Diagnosis not present

## 2021-05-10 NOTE — Patient Instructions (Signed)
-   look up hypersensitivity pneumonitis and bronchiolitis obliterans and their environmental triggers - PFTs we will schedule in 1-2 weeks - hypersensitivity pneumonitis panel we will obtain in the lab today

## 2021-05-10 NOTE — Progress Notes (Signed)
Synopsis: Referred for dyspnea by Megan Orn, MD  Subjective:   PATIENT ID: Megan [name] GENDER: female DOB: 1956/08/09, MRN: [id number]  Chief Complaint  Patient presents with   Consult    Referred for sob, states CXR showed ILD, but PCP said CT chest showed airway disease   [name] with history of AR, asthma, hypothyroid, atrial septal aneurysm extending into RVOT, MVP, pneumothorax, idiopathic neutropenia, never smoker referred for dyspnea.  In 2009 she had a pneumonia and atelectasis and required bronchoscopy.  She says that she was just started on evenity. Before this she had some work done in her crawlspace and with some brown dust it was cleaned, fogged. Smelled something relatead to this for a while. She had pleuritic CP after first dose which resolved after first day. In ED she had CXR which said interval diffuse interstitial opacities. She did feels some DOE when walking more than a healthy peer.   She is very sensitive to smells, perfumes, etc. She'll feel dyspneic.   Otherwise pertinent review of systems is negative.  She worked in [employer] fundraiser, now does Museum/gallery conservator. No frequent exposure to dusts/solvents without a mask other than episode described above.  Past Medical History:  Diagnosis Date   Allergic rhinitis    Allergy    Aneurysm of cardiac wall, congenital    a.  septal aneurysm extending into the RVOT (by cardiac MRI in 08/2013)   Anxiety    Aortic atherosclerosis (HCC)    Arthritis    Asthma    Basal cell carcinoma    Depression    Heart murmur    Hypothyroidism    on meds   Leukopenia    idiopathic   Lyme disease    Mitral valve prolapse    MVP (mitral valve prolapse)    Orthostatic hypotension    Osteoporosis    Pancreatic cyst    Peripheral neuropathy 06/09/2012   Pernicious anemia    Pernicious anemia    Pernicious anemia    Plantar fasciitis    Pneumothorax      Family History  Problem Relation Age of Onset   Hyperlipidemia  Father    Heart failure Father    Prostate cancer Father 6   Kidney failure Father    Hypertension Father    Colon polyps Father    Angina Mother    Hypertension Mother    Lung cancer Mother 45       again at 71/79-smoker   Colon cancer Mother 57   Colon polyps Mother    Lung cancer Paternal Grandfather    Breast cancer Paternal Grandmother    Diabetes Paternal Grandmother    Esophageal cancer Neg Hx    Liver cancer Neg Hx    Pancreatic cancer Neg Hx    Rectal cancer Neg Hx    Stomach cancer Neg Hx      Past Surgical History:  Procedure Laterality Date   COLONOSCOPY  2019   JMP-   COLONOSCOPY WITH PROPOFOL N/A 02/22/2013   Procedure: COLONOSCOPY WITH PROPOFOL;  Surgeon: Garlan Fair, MD;  Location: [facility];  Service: Endoscopy;  Laterality: N/A;   MOUTH SURGERY  01/2012   bone graft in mouth   NASAL SINUS SURGERY Right 06/12/2014   Procedure: RIGHT ENDOSCOPIC MAXILLARY ANTROSTOMY;  Surgeon: Ascencion Dike, MD;  Location: [place];  Service: ENT;  Laterality: Right;   ROOT CANAL  02/27/2012   TONSILLECTOMY  1962   WISDOM TOOTH EXTRACTION  Social History   Socioeconomic History   Marital status: Divorced    Spouse name: Not on file   Number of children: 0   Years of education: Not on file   Highest education level: Not on file  Occupational History    Comment: Career and life coach  Tobacco Use   Smoking status: Never   Smokeless tobacco: Never  Vaping Use   Vaping Use: Never used  Substance and Sexual Activity   Alcohol use: No    Alcohol/week: 0.0 standard drinks   Drug use: No   Sexual activity: Not on file  Other Topics Concern   Not on file  Social History Narrative   Patient is single and lives alone.   Patient is self-employed, career and life coaching.   Patient drinks two to four cups of caffeine daily.   Right handed    Social Determinants of Health   Financial Resource Strain: Not on file  Food Insecurity: Not on  file  Transportation Needs: Not on file  Physical Activity: Not on file  Stress: Not on file  Social Connections: Not on file  Intimate Partner Violence: Not on file     Allergies  Allergen Reactions   Augmentin [Amoxicillin-Pot Clavulanate] Diarrhea   Avelox [Moxifloxacin] Other (See Comments)    neuropathy   Clindamycin/Lincomycin Diarrhea   Iron     Other reaction(s): severe constipation     Outpatient Medications Prior to Visit  Medication Sig Dispense Refill   5-Hydroxytryptophan (5-HTP) 100 MG CAPS Take 100 mg by mouth daily.      albuterol (VENTOLIN HFA) 108 (90 Base) MCG/ACT inhaler Inhale 1 puff into the lungs daily as needed for wheezing or shortness of breath.      ARMOUR THYROID 30 MG tablet Take by mouth daily. Patient taking 30 MG 7 days a week  6   Ascorbic Acid (VITAMIN C PO) Take 2,500 Units by mouth daily.      Biotin 10000 MCG TABS Take 10,000 mcg by mouth daily.     Cholecalciferol (VITAMIN D3) 10000 units capsule Take 10,000 Units by mouth daily.      Cyanocobalamin (VITAMIN B-12 ER PO) Take 5,000 mcg by mouth daily.      estradiol (ESTRACE) 0.1 MG/GM vaginal cream Place 1 Applicatorful vaginally 3 (three) times a week.     heparin flush 10 UNIT/ML SOLN injection      MAGNESIUM OXIDE 400 PO Take by mouth.     Menaquinone-7 (VITAMIN K2 PO) Take 1 tablet by mouth daily.     NON FORMULARY Take 1,000 mcg by mouth daily. Methyl Folate chewable tablet     NONFORMULARY OR COMPOUNDED ITEM Apply 2 drops topically in the morning. Estradiol-Progesterone 0.06-5-0.33 MG liquid drops     Romosozumab-aqqg (EVENITY) 105 MG/1.17ML SOSY injection      vitamin A 3 MG (10000 UNITS) capsule Take 10,000 Units by mouth daily.     celecoxib (CELEBREX) 200 MG capsule Celebrex 200 mg capsule     metroNIDAZOLE (METROGEL) 0.75 % vaginal gel Place vaginally at bedtime.     Omega-3 Fatty Acids (FISH OIL) 1000 MG CAPS daily as needed.     No facility-administered medications prior to  visit.       Objective:   Physical Exam:  General appearance: 65 y.o., female, NAD, conversant  Eyes: anicteric sclerae, moist conjunctivae; no lid-lag; PERRL, tracking appropriately HENT: NCAT; oropharynx, MMM, no mucosal ulcerations; normal hard and soft palate Neck: Trachea midline; no lymphadenopathy, no  JVD Lungs: faint crackles listening anteriorly, no wheeze, with normal respiratory effort CV: RRR, no MRGs  Abdomen: Soft, non-tender; non-distended, BS present  Extremities: No peripheral edema, radial and DP pulses present bilaterally  Skin: Normal temperature, turgor and texture; no rash Psych: Appropriate affect Neuro: Alert and oriented to person and place, no focal deficit    Vitals:   05/10/21 1447  BP: 106/66  Pulse: 71  Temp: 98 F (36.7 C)  TempSrc: Oral  SpO2: 98%  Weight: 131 lb (59.4 kg)  Height: '5\' 9"'$  (1.753 m)   98% on RA BMI Readings from Last 3 Encounters:  05/10/21 19.35 kg/m  04/22/21 19.20 kg/m  04/12/21 19.05 kg/m   Wt Readings from Last 3 Encounters:  05/10/21 131 lb (59.4 kg)  04/22/21 130 lb (59 kg)  04/12/21 129 lb (58.5 kg)     CBC    Component Value Date/Time   WBC 2.8 (L) 04/22/2021 0902   WBC 2.6 (L) 04/12/2021 1751   RBC 4.40 04/22/2021 0902   HGB 13.8 04/22/2021 0902   HGB 13.7 03/15/2013 1144   HCT 42.0 04/22/2021 0902   HCT 40.8 03/15/2013 1144   PLT 161 04/22/2021 0902   PLT 210 03/15/2013 1144   MCV 95.5 04/22/2021 0902   MCV 88.9 03/15/2013 1144   MCH 31.4 04/22/2021 0902   MCHC 32.9 04/22/2021 0902   RDW 12.2 04/22/2021 0902   RDW 13.8 03/15/2013 1144   LYMPHSABS 0.8 04/22/2021 0902   LYMPHSABS 1.0 03/15/2013 1144   MONOABS 0.2 04/22/2021 0902   MONOABS 0.3 03/15/2013 1144   EOSABS 0.0 04/22/2021 0902   EOSABS 0.0 03/15/2013 1144   BASOSABS 0.0 04/22/2021 0902   BASOSABS 0.1 03/15/2013 1144    No remarkable eosinophilia  Chest Imaging: HRCT Chest 04/2021 reviewed by me and remarkable for subtle  centrilobular nodules, TIB, definite mosaicism on expiratory imaging  Pulmonary Functions Testing Results: No flowsheet data found.    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 Symptomatically it seems like she's doing pretty darn well now and she overall has a preference for care that is holistic and minimizes new medications.  Plan: - PFTs - hypersensitivity pneumonitis panel to identify potential triggers - think about bronchoscopy - a high BAL lymphocyte count might tip our hat to hypersensitivity pneumonitis and drilling down more to make sure we've taken care of any potential mold exposures.    I spent 63 minutes dedicated to the care of this patient on the date of this encounter to include pre-visit review of records, face-to-face time with the patient discussing conditions above, post visit ordering of testing, clinical documentation with the electronic health record, making appropriate referrals as documented, and communicating necessary findings to members of the patients care team.       Maryjane Hurter, MD [facility] 05/10/2021 3:56 PM

## 2021-05-18 LAB — HYPERSENSITIVITY PNEUMONITIS
A. Pullulans Abs: NEGATIVE
A.Fumigatus #1 Abs: NEGATIVE
Micropolyspora faeni, IgG: NEGATIVE
Pigeon Serum Abs: NEGATIVE
Thermoact. Saccharii: NEGATIVE
Thermoactinomyces vulgaris, IgG: NEGATIVE

## 2021-05-29 ENCOUNTER — Other Ambulatory Visit: Payer: Medicare Other

## 2021-05-29 ENCOUNTER — Ambulatory Visit: Payer: Medicare Other | Admitting: Hematology

## 2021-06-05 NOTE — Progress Notes (Signed)
Cardiology Office Note:    Date:  06/05/2021   ID:  GERLEAN [name], DOB 16-Sep-1955, MRN [id number]  PCP:  Lavone Orn, MD [place] Cardiologist: Kirk Ruths, MD   Reason for visit: Hospital follow-up  History of Present Illness:    Megan [name] is a 65 y.o. female with a hx of hyperlipidemia, aneurysm of the membranous ventricular septum that bulges into the right ventricular outflow tract, coronary calcium score of 0.  Seen with palpitations and monitor June 2021 showed sinus bradycardia, normal sinus rhythm, sinus tachycardia, occasional PAC, PVCs, rare couplet and brief PAT  She went to the emergency department April 12, 2021 with chest heaviness  The only change was a shot for osteoporosis 10 days prior.  She had left-sided chest pain, nonradiating, no associated shortness of breath.  EKG with no ischemic changes.  Negative troponins.  D-dimer and chest x-ray unremarkable.  She felt better after IV fluids and was discharged home.  She saw Dr. Stanford Breed in May 2022 and was doing well.  She has a history of chest pain but with a previous calcium score 0 and atypical symptoms, there was no further ischemic evaluation.  Today, she describes her chronic chest pain.  She feels it is a deep pain, very pinpoint in her left chest that feels like a pressure, tightness and throbbing pain.  It can happen every day or couple times a week at rest or with walking.  Lasts for 10 minutes to 1 hour.  Nothing makes it better or worse.  It does not stop her during her 3-1/2 mile walk per day.  She she does mention that she is more active before COVID.  Then she had an episode of vascular necrosis in her foot that she has been medically managed.  She mentions that she has severe osteoporosis.  She is hoping these Evenity (Romosozuma) shots will help. Past Medical History:  Diagnosis Date   Allergic rhinitis    Allergy    Aneurysm of cardiac wall, congenital    a.  septal aneurysm  extending into the RVOT (by cardiac MRI in 08/2013)   Anxiety    Aortic atherosclerosis (HCC)    Arthritis    Asthma    Basal cell carcinoma    Depression    Heart murmur    Hypothyroidism    on meds   Leukopenia    idiopathic   Lyme disease    Mitral valve prolapse    MVP (mitral valve prolapse)    Orthostatic hypotension    Osteoporosis    Pancreatic cyst    Peripheral neuropathy 06/09/2012   Pernicious anemia    Pernicious anemia    Pernicious anemia    Plantar fasciitis    Pneumothorax     Past Surgical History:  Procedure Laterality Date   COLONOSCOPY  2019   JMP-   COLONOSCOPY WITH PROPOFOL N/A 02/22/2013   Procedure: COLONOSCOPY WITH PROPOFOL;  Surgeon: Garlan Fair, MD;  Location: [facility];  Service: Endoscopy;  Laterality: N/A;   MOUTH SURGERY  01/2012   bone graft in mouth   NASAL SINUS SURGERY Right 06/12/2014   Procedure: RIGHT ENDOSCOPIC MAXILLARY ANTROSTOMY;  Surgeon: Ascencion Dike, MD;  Location: [place];  Service: ENT;  Laterality: Right;   ROOT CANAL  02/27/2012   TONSILLECTOMY  1962   WISDOM TOOTH EXTRACTION      Current Medications: No outpatient medications have been marked as taking for the 06/06/21 encounter (  Appointment) with Warren Lacy, PA-C.     Allergies:   Augmentin [amoxicillin-pot clavulanate], Avelox [moxifloxacin], Clindamycin/lincomycin, and Iron   Social History   Socioeconomic History   Marital status: Divorced    Spouse name: Not on file   Number of children: 0   Years of education: Not on file   Highest education level: Not on file  Occupational History    Comment: Career and life coach  Tobacco Use   Smoking status: Never   Smokeless tobacco: Never  Vaping Use   Vaping Use: Never used  Substance and Sexual Activity   Alcohol use: No    Alcohol/week: 0.0 standard drinks   Drug use: No   Sexual activity: Not on file  Other Topics Concern   Not on file  Social History Narrative    Patient is single and lives alone.   Patient is self-employed, career and life coaching.   Patient drinks two to four cups of caffeine daily.   Right handed    Social Determinants of Health   Financial Resource Strain: Not on file  Food Insecurity: Not on file  Transportation Needs: Not on file  Physical Activity: Not on file  Stress: Not on file  Social Connections: Not on file     Family History: The patient's family history includes Angina in her mother; Breast cancer in her paternal grandmother; Colon cancer (age of onset: 42) in her mother; Colon polyps in her father and mother; Diabetes in her paternal grandmother; Heart failure in her father; Hyperlipidemia in her father; Hypertension in her father and mother; Kidney failure in her father; Lung cancer in her paternal grandfather; Lung cancer (age of onset: 44) in her mother; Prostate cancer (age of onset: 42) in her father. There is no history of Esophageal cancer, Liver cancer, Pancreatic cancer, Rectal cancer, or Stomach cancer.  ROS:   Please see the history of present illness.     EKGs/Labs/Other Studies Reviewed:    EKG:  The ekg ordered today demonstrates sinus rhythm with premature atrial complexes, heart 63, PR interval 164 ms, QRS duration 78 ms.  Recent Labs: 03/06/2021: TSH 2.60 04/22/2021: ALT 12; BUN 15; Creatinine 0.71; Hemoglobin 13.8; Platelet Count 161; Potassium 4.6; Sodium 141   Recent Lipid Panel Lab Results  Component Value Date/Time   CHOL 192 12/31/2020 08:34 AM   TRIG 29 12/31/2020 08:34 AM   HDL 79 12/31/2020 08:34 AM   LDLCALC 107 (H) 12/31/2020 08:34 AM    Physical Exam:    VS:  There were no vitals taken for this visit.   No data found.  Wt Readings from Last 3 Encounters:  05/10/21 131 lb (59.4 kg)  04/22/21 130 lb (59 kg)  04/12/21 129 lb (58.5 kg)     GEN:  Well nourished, well developed in no acute distress HEENT: Normal NECK: No JVD; No carotid bruits CARDIAC: RRR, no murmurs,  rubs, gallops RESPIRATORY:  Clear to auscultation without rales, wheezing or rhonchi  ABDOMEN: Soft, non-tender, non-distended MUSCULOSKELETAL: Minimal LE edema below knees, nonpitting, indentation marks from socks; No deformity  SKIN: Warm and dry NEUROLOGIC:  Alert and oriented PSYCHIATRIC:  Normal affect    ASSESSMENT AND PLAN   Precordial pain -Chronic history of atypical chest pain. ED work-up negative for ischemia.  She does have history of collapsed lung 2009/ pneumonia/mucous plug and evidence of small airway disease on CT chest September 2022 -symptoms may be lung related.  She is planning to have work-up at [facility]. -  EKG today with no ischemic changes. -Calcium score in June 2019 was 0. With a calcium score of 0 and no other compelling risk factors (eg, diabetes, active smoking, hypertension, family history of premature coronary disease) & a low 10-year ASCVD risk --> she does not require statin and aspirin therapy.  Recommend repeat risk stratification at five years - 01/2023.  - If her ASCVD risk increases >5%, then reasonable to repeat CAC scanning in three to five years (could order at next visit).  She is having her lipids checked next month and we can recalculate her ASCVD risk with those numbers.  Osteoporosis -I am aware that romosozumab may increase the risk of myocardial infarction, stroke, and cardiovascular death.  I believe it is safe for Ms. Kinkade to continue Romosozumab.  She is low risk for myocardial infarction and stroke -> her 10-year risk is 3.5% by the ASCVD risk calculator.   -I do not believe her chest pain was secondary to this Evenity injection.  History of palpitations -No significant arrhythmias on monitor 2021  History of aneurysmal dilation of the intraventricular septum extending into the right ventricular outflow tract -no ventricular septal defect or sinus of Valsalva aneurysm noted on previous evaluation 01/2018  Hx of orthostatic hypotension -No recent  symptoms.  Recommend hydration.  Minimal LE edema -Recommend compression stockings.  Continue salt restriction.  Hyperlipidemia -After she saw nutritionist, she made some dietary changes and she is noted that her LDL has improved from 149 in November 2021 to 107 in May 2022.  Her HDL is 79, this high level is cardioprotective.  Triglycerides low at 29.  Total cholesterol 192. -Recheck lipids in November.  She does note that her diet has is not been as strict lately. -Discussed cholesterol lowering diets - Mediterranean diet, DASH diet, vegetarian diet, low-carbohydrate diet and avoidance of trans fats.  Discussed healthier choice substitutes.  Nuts, high-fiber foods, and fiber supplements may also improve lipids.    Disposition - Follow-up in ~8 months as scheduled.   Medication Adjustments/Labs and Tests Ordered: Current medicines are reviewed at length with the patient today.  Concerns regarding medicines are outlined above.  No orders of the defined types were placed in this encounter.  No orders of the defined types were placed in this encounter.   There are no Patient Instructions on file for this visit.   Signed, Warren Lacy, PA-C  06/05/2021 10:47 AM    [facility]

## 2021-06-06 ENCOUNTER — Other Ambulatory Visit: Payer: Self-pay

## 2021-06-06 ENCOUNTER — Encounter: Payer: Self-pay | Admitting: Physician Assistant

## 2021-06-06 ENCOUNTER — Ambulatory Visit (INDEPENDENT_AMBULATORY_CARE_PROVIDER_SITE_OTHER): Payer: Medicare Other | Admitting: Physician Assistant

## 2021-06-06 ENCOUNTER — Other Ambulatory Visit: Payer: Self-pay | Admitting: Physician Assistant

## 2021-06-06 VITALS — BP 106/72 | HR 63 | Ht 69.25 in | Wt 131.0 lb

## 2021-06-06 DIAGNOSIS — M810 Age-related osteoporosis without current pathological fracture: Secondary | ICD-10-CM

## 2021-06-06 DIAGNOSIS — R072 Precordial pain: Secondary | ICD-10-CM | POA: Diagnosis not present

## 2021-06-06 DIAGNOSIS — I253 Aneurysm of heart: Secondary | ICD-10-CM | POA: Diagnosis not present

## 2021-06-06 DIAGNOSIS — E7800 Pure hypercholesterolemia, unspecified: Secondary | ICD-10-CM

## 2021-06-06 NOTE — Patient Instructions (Signed)
Medication Instructions:  No Changes *If you need a refill on your cardiac medications before your next appointment, please call your pharmacy*   Lab Work: Lipid : To Be Done in November If you have labs (blood work) drawn today and your tests are completely normal, you will receive your results only by: [place] (if you have MyChart) OR A paper copy in the mail If you have any lab test that is abnormal or we need to change your treatment, we will call you to review the results.   Testing/Procedures: No Testing   Follow-Up: At [facility], you and your health needs are our priority.  As part of our continuing mission to provide you with exceptional heart care, we have created designated Provider Care Teams.  These Care Teams include your primary Cardiologist (physician) and Advanced Practice Providers (APPs -  Physician Assistants and Nurse Practitioners) who all work together to provide you with the care you need, when you need it.  Your next appointment:   7 month(s)  The format for your next appointment:   In Person  Provider:   Kirk Ruths, MD

## 2021-06-25 ENCOUNTER — Ambulatory Visit: Payer: Medicare Other | Admitting: Nurse Practitioner

## 2021-06-28 ENCOUNTER — Other Ambulatory Visit: Payer: Self-pay

## 2021-06-28 DIAGNOSIS — D510 Vitamin B12 deficiency anemia due to intrinsic factor deficiency: Secondary | ICD-10-CM

## 2021-07-01 ENCOUNTER — Other Ambulatory Visit: Payer: Self-pay

## 2021-07-01 ENCOUNTER — Inpatient Hospital Stay: Payer: Medicare Other | Attending: Hematology

## 2021-07-01 DIAGNOSIS — D510 Vitamin B12 deficiency anemia due to intrinsic factor deficiency: Secondary | ICD-10-CM | POA: Diagnosis present

## 2021-07-01 DIAGNOSIS — Z79899 Other long term (current) drug therapy: Secondary | ICD-10-CM | POA: Diagnosis not present

## 2021-07-01 DIAGNOSIS — D696 Thrombocytopenia, unspecified: Secondary | ICD-10-CM | POA: Insufficient documentation

## 2021-07-01 LAB — CBC WITH DIFFERENTIAL (CANCER CENTER ONLY)
Abs Immature Granulocytes: 0.01 10*3/uL (ref 0.00–0.07)
Basophils Absolute: 0 10*3/uL (ref 0.0–0.1)
Basophils Relative: 1 %
Eosinophils Absolute: 0 10*3/uL (ref 0.0–0.5)
Eosinophils Relative: 1 %
HCT: 40.3 % (ref 36.0–46.0)
Hemoglobin: 13.2 g/dL (ref 12.0–15.0)
Immature Granulocytes: 0 %
Lymphocytes Relative: 28 %
Lymphs Abs: 0.9 10*3/uL (ref 0.7–4.0)
MCH: 30.6 pg (ref 26.0–34.0)
MCHC: 32.8 g/dL (ref 30.0–36.0)
MCV: 93.5 fL (ref 80.0–100.0)
Monocytes Absolute: 0.2 10*3/uL (ref 0.1–1.0)
Monocytes Relative: 8 %
Neutro Abs: 1.9 10*3/uL (ref 1.7–7.7)
Neutrophils Relative %: 62 %
Platelet Count: 174 10*3/uL (ref 150–400)
RBC: 4.31 MIL/uL (ref 3.87–5.11)
RDW: 12.4 % (ref 11.5–15.5)
WBC Count: 3.1 10*3/uL — ABNORMAL LOW (ref 4.0–10.5)
nRBC: 0 % (ref 0.0–0.2)

## 2021-07-01 LAB — IRON AND TIBC
Iron: 129 ug/dL (ref 41–142)
Saturation Ratios: 39 % (ref 21–57)
TIBC: 328 ug/dL (ref 236–444)
UIBC: 198 ug/dL (ref 120–384)

## 2021-07-01 LAB — FERRITIN: Ferritin: 98 ng/mL (ref 11–307)

## 2021-07-01 LAB — CEA (IN HOUSE-CHCC): CEA (CHCC-In House): 2.58 ng/mL (ref 0.00–5.00)

## 2021-07-01 LAB — VITAMIN B12: Vitamin B-12: 964 pg/mL — ABNORMAL HIGH (ref 180–914)

## 2021-07-05 ENCOUNTER — Other Ambulatory Visit: Payer: Self-pay | Admitting: Sports Medicine

## 2021-07-05 DIAGNOSIS — E039 Hypothyroidism, unspecified: Secondary | ICD-10-CM

## 2021-07-05 DIAGNOSIS — R9389 Abnormal findings on diagnostic imaging of other specified body structures: Secondary | ICD-10-CM

## 2021-07-06 ENCOUNTER — Encounter: Payer: Self-pay | Admitting: Internal Medicine

## 2021-07-10 ENCOUNTER — Ambulatory Visit: Payer: Medicare Other | Admitting: Nurse Practitioner

## 2021-07-12 ENCOUNTER — Encounter: Payer: Self-pay | Admitting: Internal Medicine

## 2021-07-12 NOTE — Telephone Encounter (Signed)
I do not think repeat colonoscopy is needed now [place] to Rx: Suprep -- for purge.  This can be split or not split per package instructions

## 2021-07-15 ENCOUNTER — Other Ambulatory Visit: Payer: Self-pay

## 2021-07-15 ENCOUNTER — Ambulatory Visit
Admission: RE | Admit: 2021-07-15 | Discharge: 2021-07-15 | Disposition: A | Discharge status: Home / Self Care | Payer: Medicare Other | Source: Ambulatory Visit | Attending: Sports Medicine | Admitting: Sports Medicine

## 2021-07-15 DIAGNOSIS — R9389 Abnormal findings on diagnostic imaging of other specified body structures: Secondary | ICD-10-CM

## 2021-07-15 DIAGNOSIS — E039 Hypothyroidism, unspecified: Secondary | ICD-10-CM

## 2021-07-15 MED ORDER — NA SULFATE-K SULFATE-MG SULF 17.5-3.13-1.6 GM/177ML PO SOLN: 1.0000 | Freq: Once | ORAL | 0 refills | Status: AC

## 2021-07-22 ENCOUNTER — Other Ambulatory Visit: Payer: Medicare Other

## 2021-07-22 ENCOUNTER — Ambulatory Visit: Payer: Medicare Other | Admitting: Nurse Practitioner

## 2021-07-29 ENCOUNTER — Other Ambulatory Visit (INDEPENDENT_AMBULATORY_CARE_PROVIDER_SITE_OTHER): Payer: Medicare Other

## 2021-07-29 ENCOUNTER — Ambulatory Visit (INDEPENDENT_AMBULATORY_CARE_PROVIDER_SITE_OTHER): Payer: Medicare Other | Admitting: Nurse Practitioner

## 2021-07-29 ENCOUNTER — Encounter: Payer: Self-pay | Admitting: Nurse Practitioner

## 2021-07-29 VITALS — BP 96/68 | HR 74 | Ht 69.0 in | Wt 132.4 lb

## 2021-07-29 DIAGNOSIS — K5909 Other constipation: Secondary | ICD-10-CM

## 2021-07-29 DIAGNOSIS — R1030 Lower abdominal pain, unspecified: Secondary | ICD-10-CM | POA: Diagnosis not present

## 2021-07-29 DIAGNOSIS — D510 Vitamin B12 deficiency anemia due to intrinsic factor deficiency: Secondary | ICD-10-CM | POA: Diagnosis not present

## 2021-07-29 LAB — CBC WITH DIFFERENTIAL/PLATELET
Basophils Absolute: 0 10*3/uL (ref 0.0–0.1)
Basophils Relative: 1.1 % (ref 0.0–3.0)
Eosinophils Absolute: 0 10*3/uL (ref 0.0–0.7)
Eosinophils Relative: 0.8 % (ref 0.0–5.0)
HCT: 41.2 % (ref 36.0–46.0)
Hemoglobin: 13.9 g/dL (ref 12.0–15.0)
Lymphocytes Relative: 31 % (ref 12.0–46.0)
Lymphs Abs: 0.7 10*3/uL (ref 0.7–4.0)
MCHC: 33.7 g/dL (ref 30.0–36.0)
MCV: 92.8 fl (ref 78.0–100.0)
Monocytes Absolute: 0.2 10*3/uL (ref 0.1–1.0)
Monocytes Relative: 8.6 % (ref 3.0–12.0)
Neutro Abs: 1.4 10*3/uL (ref 1.4–7.7)
Neutrophils Relative %: 58.5 % (ref 43.0–77.0)
Platelets: 162 10*3/uL (ref 150.0–400.0)
RBC: 4.44 Mil/uL (ref 3.87–5.11)
RDW: 13.3 % (ref 11.5–15.5)
WBC: 2.4 10*3/uL — ABNORMAL LOW (ref 4.0–10.5)

## 2021-07-29 LAB — COMPREHENSIVE METABOLIC PANEL
ALT: 14 U/L (ref 0–35)
AST: 26 U/L (ref 0–37)
Albumin: 4.5 g/dL (ref 3.5–5.2)
Alkaline Phosphatase: 60 U/L (ref 39–117)
BUN: 13 mg/dL (ref 6–23)
CO2: 28 mEq/L (ref 19–32)
Calcium: 9.4 mg/dL (ref 8.4–10.5)
Chloride: 106 mEq/L (ref 96–112)
Creatinine, Ser: 0.67 mg/dL (ref 0.40–1.20)
GFR: 91.05 mL/min (ref >60.00)
Glucose, Bld: 77 mg/dL (ref 70–99)
Potassium: 4.1 mEq/L (ref 3.5–5.1)
Sodium: 141 mEq/L (ref 135–145)
Total Bilirubin: 0.7 mg/dL (ref 0.2–1.2)
Total Protein: 6.6 g/dL (ref 6.0–8.3)

## 2021-07-29 LAB — C-REACTIVE PROTEIN: CRP: <1 mg/dL (ref 0.5–20.0)

## 2021-07-29 NOTE — Patient Instructions (Addendum)
LABS:  Lab work has been ordered for you today. Our lab is located in the basement. Press "B" on the elevator. The lab is located at the first door on the left as you exit the elevator.  HEALTHCARE LAWS AND MY CHART RESULTS: Due to recent changes in healthcare laws, you may see the results of your imaging and laboratory studies on MyChart before your provider has had a chance to review them.   We understand that in some cases there may be results that are confusing or concerning to you. Not all laboratory results come back in the same time frame and the provider may be waiting for multiple results in order to interpret others.  Please give [place] 48 hours in order for your provider to thoroughly review all the results before contacting the office for clarification of your results.   RECOMMENDATIONS: Reduce magnesium to 300 mg at night, stop if loose stool and abdominal pain persists. Consider a trial of low dose Linzess for constipation. Please contact our office if abdominal pain worsen. Miralax- Dissolve one capful in 8 ounces of water and drink before bed as needed for hard stools.  It was great seeing you today! Thank you for entrusting me with your care and choosing [facility].  Noralyn Pick, CRNP  The [place] GI providers would like to encourage you to use [facility] to communicate with providers for non-urgent requests or questions.  Due to long hold times on the telephone, sending your provider a message by [facility] may be faster and more efficient way to get a response. Please allow 48 business hours for a response.  Please remember that this is for non-urgent requests/questions. If you are age 81 or older, your body mass index should be between 23-30. Your Body mass index is 19.55 kg/m. If this is out of the aforementioned range listed, please consider follow up with your Primary Care Provider.  If you are age 29 or younger, your body mass index should be between  19-25. Your Body mass index is 19.55 kg/m. If this is out of the aformentioned range listed, please consider follow up with your Primary Care Provider.

## 2021-07-29 NOTE — Progress Notes (Signed)
07/29/2021 Megan [name] [id number] 02/12/1956   Chief Complaint:  Constipation   History of Present Illness: Megan [name] is a 65 year old female with a pst medical history of anxiety, hypothyroidism, small pancreatic cysts (possible sidebranch IPMN), idiopathic leukopenia, pernicious anemia and chronic constipation. She is followed by Dr. Hilarie Fredrickson. She presents to our office today for further evaluation regarding constipation and abdominal bloat which started shortly after she received Evenity injections for osteoporosis 03/2021. Prior to starting Evenity, she passed a normal brown BM daily. She was seen by her osteopath who palpated and massaged her abdomen and thought she had a redundant colon. She reported passing small balls of stool and sometimes she had to insert her finger into her vagina to push against the vaginal/rectal wall to facilitate passing a BM. She can go 4 days without passing a BM. She has intermittent pain to the RLQ and central lower abdomen for the past 2 months.  She contacted Dr. Hilarie Fredrickson on 07/12/2021 with persistent constipation and she was prescribed Suprep for a bowel purge which she decided not to take. She previously took Magnesium Oxide 300mg  QD with a smoothie QD then switched to Magnesium 600mg  QD on an empty stomach which has resulted in passing 1 or 2 loose nonbloody stools x 4 days with mild lower abdominal cramping.   Her most recent colonoscopy was 03/04/2018 which was normal.  She was advised to repeat a colonoscopy in 5 years.  Father with history of colon cancer.  She has a history of pernicious anemia confirmed with with antiparietal and antiintrinsic factor antibodies. She was previously advised by Dr. Hilarie Fredrickson to schedule an EGD to rule out autoimmune atrophic gastritis which she deferred due to having concerns that the mouth piece used during an EGD might damage her recent dental repair. She started taking otc digestive enzyme Betaine HCL caps 3 days  ago.   History of a small pancreatic cyst, (likely IPMN) stable per MRI.  CBC Latest Ref Rng & Units 07/01/2021 04/22/2021 04/12/2021  WBC 4.0 - 10.5 K/uL 3.1(L) 2.8(L) 2.6(L)  Hemoglobin 12.0 - 15.0 g/dL 13.2 13.8 14.4  Hematocrit 36.0 - 46.0 % 40.3 42.0 43.6  Platelets 150 - 400 K/uL 174 161 175    CMP Latest Ref Rng & Units 04/22/2021 04/12/2021 07/01/2020  Glucose 70 - 99 mg/dL 90 100(H) 128(H)  BUN 8 - 23 mg/dL 15 16 14   Creatinine 0.44 - 1.00 mg/dL 0.71 0.68 0.74  Sodium 135 - 145 mmol/L 141 142 138  Potassium 3.5 - 5.1 mmol/L 4.6 3.1(L) 3.7  Chloride 98 - 111 mmol/L 107 106 102  CO2 22 - 32 mmol/L 25 28 27   Calcium 8.9 - 10.3 mg/dL 9.0 9.0 9.5  Total Protein 6.5 - 8.1 g/dL 6.6 - 6.5  Total Bilirubin 0.3 - 1.2 mg/dL 0.4 - 0.7  Alkaline Phos 38 - 126 U/L 87 - 70  AST 15 - 41 U/L 23 - 37  ALT 0 - 44 U/L 12 - 24    Colonoscopy 03/04/2018: - The entire examined colon is normal on direct and retroflexion views. - No specimens collected. - 5 year colonoscopy recall   Past Medical History:  Diagnosis Date   Allergic rhinitis    Allergy    Aneurysm of cardiac wall, congenital    a.  septal aneurysm extending into the RVOT (by cardiac MRI in 08/2013)   Anxiety    Aortic atherosclerosis (HCC)    Arthritis  Asthma    Basal cell carcinoma    Depression    Heart murmur    Hypothyroidism    on meds   Leukopenia    idiopathic   Lyme disease    Mitral valve prolapse    MVP (mitral valve prolapse)    Orthostatic hypotension    Osteoporosis    Pancreatic cyst    Peripheral neuropathy 06/09/2012   Pernicious anemia    Pernicious anemia    Pernicious anemia    Plantar fasciitis    Pneumothorax    Past Surgical History:  Procedure Laterality Date   COLONOSCOPY  2019   JMP-   COLONOSCOPY WITH PROPOFOL N/A 02/22/2013   Procedure: COLONOSCOPY WITH PROPOFOL;  Surgeon: Garlan Fair, MD;  Location: [facility];  Service: Endoscopy;  Laterality: N/A;   MOUTH SURGERY  01/2012    bone graft in mouth   NASAL SINUS SURGERY Right 06/12/2014   Procedure: RIGHT ENDOSCOPIC MAXILLARY ANTROSTOMY;  Surgeon: Ascencion Dike, MD;  Location: [place];  Service: ENT;  Laterality: Right;   ROOT CANAL  02/27/2012   TONSILLECTOMY  1962   WISDOM TOOTH EXTRACTION     Current Outpatient Medications on File Prior to Visit  Medication Sig Dispense Refill   5-Hydroxytryptophan (5-HTP) 100 MG CAPS Take 100 mg by mouth daily.      albuterol (VENTOLIN HFA) 108 (90 Base) MCG/ACT inhaler Inhale 1 puff into the lungs daily as needed for wheezing or shortness of breath.      ARMOUR THYROID 30 MG tablet Take 30 mg by mouth daily. Patient taking 15mg  4 days a week  6   Ascorbic Acid (VITAMIN C PO) Take 2,500 Units by mouth daily.      Biotin 10000 MCG TABS Take 10,000 mcg by mouth daily.     BORON PO Take 75 mg by mouth daily.     Cholecalciferol (VITAMIN D3) 10000 units capsule Take 10,000 Units by mouth daily.      Cyanocobalamin (VITAMIN B-12 ER PO) Take 5,000 mcg by mouth daily.      Digestive Enzymes (BETAINE HCL) 650-130 MG CAPS Take 650 mg by mouth 2 (two) times daily.     estradiol (ESTRACE) 0.1 MG/GM vaginal cream Place 1 Applicatorful vaginally 3 (three) times a week.     MAGNESIUM CITRATE PO Take 600 mg by mouth daily.     MAGNESIUM OXIDE 400 PO Take by mouth.     Menaquinone-7 (VITAMIN K2 PO) Take 1 tablet by mouth daily.     NON FORMULARY Take 1,000 mcg by mouth daily. Methyl Folate chewable tablet     NONFORMULARY OR COMPOUNDED ITEM Apply 2 drops topically in the morning. Estradiol-Progesterone 0.06-5-0.33 MG liquid drops     Romosozumab-aqqg (EVENITY) 105 MG/1.17ML SOSY injection      Strontium Chloride CRYS 227 mg by Does not apply route daily.     No current facility-administered medications on file prior to visit.   Allergies  Allergen Reactions   Augmentin [Amoxicillin-Pot Clavulanate] Diarrhea   Avelox [Moxifloxacin] Other (See Comments)    neuropathy    Clindamycin/Lincomycin Diarrhea   Iron     Other reaction(s): severe constipation   Current Medications, Allergies, Past Medical History, Past Surgical History, Family History and Social History were reviewed in [employer] record.  Review of Systems:   Constitutional: Negative for fever, sweats, chills or weight loss.  Respiratory: Negative for shortness of breath.   Cardiovascular: Negative for chest pain,  palpitations and leg swelling.  Gastrointestinal: See HPI.  Musculoskeletal: Negative for back pain or muscle aches.  Neurological: Negative for dizziness, headaches or paresthesias.   Physical Exam: BP 96/68   Pulse 74   Ht 5\' 9"  (1.753 m)   Wt 132 lb 6.4 oz (60.1 kg)   SpO2 99%   BMI 19.55 kg/m   General: 65 year old female in no acute distress. Head: Normocephalic and atraumatic. Eyes: No scleral icterus. Conjunctiva pink . Ears: Normal auditory acuity. Mouth: Dentition intact. No ulcers or lesions.  Lungs: Clear throughout to auscultation. Heart: Regular rate and rhythm, no murmur. Abdomen: Soft, nontender and nondistended. No masses or hepatomegaly. Normal bowel sounds x 4 quadrants. Mild RLQ, central tenderness, less tenderness to the LLQ.  No rebound or guarding.  Positive bowel sounds all 4 quadrants. Rectal: Deferred. Musculoskeletal: Symmetrical with no gross deformities. Extremities: No edema. Neurological: Alert oriented x 4. No focal deficits.  Psychological: Alert and cooperative. Normal mood and affect  Assessment and Recommendations: 54) 65 year old female with chronic constipation worsened after receiving monthly Evenity injections for osteoporosis 03/2021 with intermittent lower abdominal pain. Now having loose stools and lower abdominal cramping since switching Magnesium Oxide 600mg  without food 4 days ago. -Reduce Magnesium Oxide to 300mg  QD -If constipation recurs may start Miralax (to prevent hard stools) -I discussed starting  low dose Linzess if constipation recurs -CBC, CMP and CRP -CTAP if lower abdominal pain persists or worsens  -Recommended GYN consult to rule out suspected rectocele  2) Pernicious anemia on B12 supplementation  -Patient previously declined recommended EGD to assess for autoimmune gastritis  -To reconsider EGD, to discuss further with Dr. Hilarie Fredrickson as patient had questions regarding the mouthguard used during this procedure  3) Stable 8 mm cyst at junction of pancreatic body and tail per MRI -Next abdominal MRI due 12/2021  4) Leukopenia, idiopathic   5) Family history of colon cancer  -Next colonoscopy due July 2024

## 2021-07-30 ENCOUNTER — Encounter: Payer: Self-pay | Admitting: Hematology

## 2021-08-05 NOTE — Progress Notes (Signed)
Addendum: Reviewed and agree with assessment and management plan. EGD is very very low risk for damage to dental work/teeth but I cannot say with 075% certainty that this will not happen. I have explained this to her before Megan [name], Megan Lines, MD

## 2021-08-08 ENCOUNTER — Ambulatory Visit: Payer: Medicare Other | Admitting: Student

## 2021-09-02 ENCOUNTER — Other Ambulatory Visit: Payer: Medicare Other

## 2021-11-14 ENCOUNTER — Other Ambulatory Visit: Payer: Self-pay | Admitting: Otolaryngology

## 2021-11-14 DIAGNOSIS — J329 Chronic sinusitis, unspecified: Secondary | ICD-10-CM

## 2021-11-23 LAB — LIPID PANEL
Chol/HDL Ratio: 2.2 ratio (ref 0.0–4.4)
Cholesterol, Total: 168 mg/dL (ref 100–199)
HDL: 78 mg/dL (ref >39)
LDL Chol Calc (NIH): 82 mg/dL (ref 0–99)
Triglycerides: 34 mg/dL (ref 0–149)
VLDL Cholesterol Cal: 8 mg/dL (ref 5–40)

## 2021-12-03 ENCOUNTER — Telehealth: Payer: Self-pay | Admitting: *Deleted

## 2021-12-03 DIAGNOSIS — K862 Cyst of pancreas: Secondary | ICD-10-CM

## 2021-12-03 NOTE — Telephone Encounter (Signed)
-----   Message from Larina Bras, Mathews sent at 12/03/2020  8:50 AM EDT ----- ? ?----- Message ----- ?From: Larina Bras, CMA ?Sent: 12/03/2020   8:30 AM EDT ?To: Larina Bras, CMA ? ?Delay until 12/2021 as per 10/31/20 office note/telephone note following ?----- Message ----- ?From: Larina Bras, CMA ?Sent: 12/03/2020  12:00 AM EDT ?To: Larina Bras, CMA ? ?Needs MRI abd w/wo contrast pancreatic protocol for pancreatic cyst follow up in 12/2020. Needs office follow up after. ? ?See 01/18/20 office note ? ? ? ?

## 2021-12-03 NOTE — Telephone Encounter (Signed)
Patient called back and stated that you can call her back tomorrow afternoon. Please advise.  ?

## 2021-12-03 NOTE — Telephone Encounter (Signed)
Patient has been scheduled for MRI abd w/wo (for pancreatic cyst follow up of 2021 MRCP) at [facility] on 01/07/22 at 9:00 am, 8:30 am arrival. NPO 4 hours prior. Per scheduler, Verdis Frederickson, no BUN/Creatinine necessary prior to imaging. ? ?I have left a message for patient to call back. ?

## 2021-12-04 NOTE — Telephone Encounter (Signed)
Called and spoke with patient, to advise that Carla Drape is out of the office. I let her know that I reviewed her previous MRI and office visit and it appeared that she did not need the MRCP. She advised that her previous discussion with Dr. Hilarie Fredrickson was that he had spoke with a colleague that advised that she should have MRCP. I advised that I would forward this message to Carla Drape so she could better advise the patient of plans for her up coming imagine.  ?

## 2021-12-04 NOTE — Telephone Encounter (Signed)
Patient is returning your call. Patient is aware of upcoming MRI that was scheduled at [facility] 01/07/22. Per patient, are we needing a MRCP prior to MRI? Please advise.  ?

## 2021-12-06 NOTE — Telephone Encounter (Signed)
Dr Hilarie Fredrickson- ?Please advise on patient's request.... ? ?Of note; your previous addendum notes the following: ? ?" Dr. Ardis Hughs reviewed her pancreatic cyst and previous imaging.  He recommended MRI is the favored modality but disagreed with the annual surveillance interval.  He recommended surveillance again in 2023 and is stable no further surveillance will be necessary. ?I appreciate his opinion and we will plan to survey again next year with MRI and if stable discontinue surveillance for the small lesion ?Patient notified via MyChart message ?JMP" ? ? ? In addition, it does not appear she ever had the recommended EUS. She was originally scheduled 11/23/20 but cancelled. ?

## 2021-12-08 NOTE — Telephone Encounter (Signed)
Ok to add MRCP to MRI abd with and without contrast ? ?

## 2021-12-09 ENCOUNTER — Other Ambulatory Visit: Payer: Self-pay

## 2021-12-09 NOTE — Telephone Encounter (Signed)
Called central scheduling for radiology department and changed MRI abdomen to MRI/MRP of the abdomen. Spoke with patient and made her aware of the change. The appt is the same date and time as previously scheduled.  ?

## 2021-12-25 ENCOUNTER — Other Ambulatory Visit: Payer: Medicare Other

## 2021-12-27 ENCOUNTER — Other Ambulatory Visit: Payer: Medicare Other

## 2022-01-07 ENCOUNTER — Ambulatory Visit (HOSPITAL_COMMUNITY): Payer: Medicare Other

## 2022-01-20 ENCOUNTER — Encounter: Payer: Self-pay | Admitting: Internal Medicine

## 2022-01-24 ENCOUNTER — Ambulatory Visit (HOSPITAL_COMMUNITY): Payer: Medicare Other

## 2022-01-26 ENCOUNTER — Other Ambulatory Visit: Payer: Self-pay | Admitting: Internal Medicine

## 2022-01-26 ENCOUNTER — Ambulatory Visit (HOSPITAL_COMMUNITY)
Admission: RE | Admit: 2022-01-26 | Discharge: 2022-01-26 | Disposition: A | Discharge status: Home / Self Care | Payer: Medicare Other | Source: Ambulatory Visit | Attending: Internal Medicine | Admitting: Internal Medicine

## 2022-01-26 DIAGNOSIS — K862 Cyst of pancreas: Secondary | ICD-10-CM

## 2022-02-04 ENCOUNTER — Ambulatory Visit (INDEPENDENT_AMBULATORY_CARE_PROVIDER_SITE_OTHER): Payer: Medicare Other | Admitting: Internal Medicine

## 2022-02-04 ENCOUNTER — Encounter: Payer: Self-pay | Admitting: Internal Medicine

## 2022-02-04 ENCOUNTER — Other Ambulatory Visit: Payer: Medicare Other

## 2022-02-04 VITALS — BP 90/60 | HR 68 | Ht 69.0 in | Wt 131.1 lb

## 2022-02-04 DIAGNOSIS — K5900 Constipation, unspecified: Secondary | ICD-10-CM

## 2022-02-04 DIAGNOSIS — K5901 Slow transit constipation: Secondary | ICD-10-CM | POA: Diagnosis not present

## 2022-02-04 DIAGNOSIS — R109 Unspecified abdominal pain: Secondary | ICD-10-CM | POA: Diagnosis not present

## 2022-02-04 DIAGNOSIS — D510 Vitamin B12 deficiency anemia due to intrinsic factor deficiency: Secondary | ICD-10-CM | POA: Diagnosis not present

## 2022-02-04 DIAGNOSIS — K862 Cyst of pancreas: Secondary | ICD-10-CM

## 2022-02-04 NOTE — Patient Instructions (Signed)
If you are age 67 or older, your body mass index should be between 23-30. Your Body mass index is 19.36 kg/m. If this is out of the aforementioned range listed, please consider follow up with your Primary Care Provider.  If you are age 44 or younger, your body mass index should be between 19-25. Your Body mass index is 19.36 kg/m. If this is out of the aformentioned range listed, please consider follow up with your Primary Care Provider.   ________________________________________________________  The [place] [facility] providers would like to encourage you to use [facility] to communicate with providers for non-urgent requests or questions.  Due to long hold times on the telephone, sending your provider a message by [facility] may be a faster and more efficient way to get a response.  Please allow 48 business hours for a response.  Please remember that this is for non-urgent requests.  _______________________________________________________  Your provider has requested that you go to the basement level for lab work before leaving today. Press "B" on the elevator. The lab is located at the first door on the left as you exit the elevator.

## 2022-02-04 NOTE — Progress Notes (Signed)
Spoke with pt and she is aware,

## 2022-02-04 NOTE — Progress Notes (Signed)
JMP, Thanks for reaching out.  I have reviewed the imaging.  Based on overall stability over a year, agree with repeat imaging.  You can do another 1 more at 1 year and if relatively stable then push out to 2 years from there.  I do not see an allergy to gadolinium contrast and her kidney function is normal so would just try to redo MRI/MRCP with contrast if possible but if she still has concerns with contrast what you guys did with diffusion imaging here seems to be quite helpful. Thanks. GM

## 2022-02-04 NOTE — Progress Notes (Signed)
Subjective:    Patient ID: Megan [name], female    DOB: 13-Aug-1956, 66 y.o.   MRN: [id number]  HPI Megan [name] is a 66 year old female with a history of chronic constipation/colonic inertia, family history of colon cancer in her mother, family history of colon polyps in her father, pernicious anemia, hypothyroidism, small pancreatic cyst (possible sidebranch IPMN), osteoporosis who is here for follow-up.  She was last seen in December 2022 by Carl Best.  She is here alone today.  She recently had a repeat MRI to follow-up pancreatic cyst.  See below.  She reports that she has started Evenity for osteoporosis and with this she has had worsening of her constipation symptoms.  She is also been taking almond milk with 1900 mg of calcium daily and noticed increase in constipation and her abdominal bloating.  She went away from the calcium supplementation in the almond milk and added a GI fortify product which contains psyllium, flax and aloe.  She tried several days of grapefruit seed extract and noticed small white flecks in her stool.  She was worried about possible parasitic infection.  When she stopped the grapeseed it went away.  She does have periods of right lower quadrant and left upper quadrant discomfort.  She states she can feel the stool moving throughout her colon.  She is able to have a bowel movement most days.  She is taking 600 to 800 mg of magnesium daily.  She has deferred EGD which was previously recommended for pernicious anemia due to concern of possible dental or tooth damage with the bite block which is used.  She reports her dentist scared her off from this for that reason.   Review of Systems As per HPI, otherwise negative  Current Medications, Allergies, Past Medical History, Past Surgical History, Family History and Social History were reviewed in [employer] record.     Objective:   Physical Exam Ht '5\' 9"'$  (1.753 m) Comment: height  measured without shoes  Wt 131 lb 2 oz (59.5 kg)   BMI 19.36 kg/m  Gen: awake, alert, NAD HEENT: anicteric  Abd: soft, NT/ND, +BS throughout Ext: no c/c/e Neuro: nonfocal  MRI ABDOMEN WITHOUT CONTRAST  (INCLUDING MRCP)   TECHNIQUE: Multiplanar multisequence MR imaging of the abdomen was performed. Heavily T2-weighted images of the biliary and pancreatic ducts were obtained, and three-dimensional MRCP images were rendered by post processing.   COMPARISON:  MRI abdomen 12/28/2019   FINDINGS: Lower chest: No acute findings.   Hepatobiliary: Liver appears within normal limits. No hepatic mass identified. Gallbladder appears normal. No biliary ductal dilatation.   Pancreas: Slightly increased size of a 11 mm cyst in the tail the pancreas which appears to have a possible thin internal septation. No definite communication with the pancreatic duct is visualized. Main pancreatic duct is normal caliber.   Spleen:  Within normal limits in size and appearance.   Adrenals/Urinary Tract: Adrenal glands appear normal. A few tiny subcentimeter likely cysts in the kidneys. No hydronephrosis.   Stomach/Bowel: No evidence of bowel obstruction.   Vascular/Lymphatic: No pathologically enlarged lymph nodes identified. No abdominal aortic aneurysm demonstrated.   Other:  No ascites   Musculoskeletal: No suspicious bone lesions identified.   IMPRESSION: Cystic lesion in the tail of the pancreas as described which is not significantly enlarged since previous study. Recommend continued follow-up with MRI in 12 months.     Electronically Signed   By: Ofilia Neas M.D.   On:  01/27/2022 10:14      Latest Ref Rng & Units 07/29/2021    9:54 AM 07/01/2021    2:10 PM 04/22/2021    9:02 AM  CBC  WBC 4.0 - 10.5 K/uL 2.4 Repeated and verified X2.  3.1  2.8   Hemoglobin 12.0 - 15.0 g/dL 13.9  13.2  13.8   Hematocrit 36.0 - 46.0 % 41.2  40.3  42.0   Platelets 150.0 - 400.0 K/uL 162.0   174  161    CMP     Component Value Date/Time   NA 141 07/29/2021 0954   NA 143 01/19/2018 0941   NA 140 05/24/2012 1053   K 4.1 07/29/2021 0954   K 4.1 05/24/2012 1053   CL 106 07/29/2021 0954   CL 106 05/24/2012 1053   CO2 28 07/29/2021 0954   CO2 27 05/24/2012 1053   GLUCOSE 77 07/29/2021 0954   GLUCOSE 72 05/24/2012 1053   BUN 13 07/29/2021 0954   BUN 11 01/19/2018 0941   BUN 13.0 05/24/2012 1053   CREATININE 0.67 07/29/2021 0954   CREATININE 0.71 04/22/2021 0902   CREATININE 0.60 11/04/2017 0918   CREATININE 0.7 05/24/2012 1053   CALCIUM 9.4 07/29/2021 0954   CALCIUM 9.5 05/24/2012 1053   PROT 6.6 07/29/2021 0954   PROT 6.6 06/29/2020 0816   PROT 6.6 05/24/2012 1053   ALBUMIN 4.5 07/29/2021 0954   ALBUMIN 4.9 (H) 06/29/2020 0816   ALBUMIN 4.0 05/24/2012 1053   AST 26 07/29/2021 0954   AST 23 04/22/2021 0902   AST 29 05/24/2012 1053   ALT 14 07/29/2021 0954   ALT 12 04/22/2021 0902   ALT 13 05/24/2012 1053   ALKPHOS 60 07/29/2021 0954   ALKPHOS 65 05/24/2012 1053   BILITOT 0.7 07/29/2021 0954   BILITOT 0.4 04/22/2021 0902   BILITOT 0.50 05/24/2012 1053   GFRNONAA >60 04/22/2021 0902   GFRNONAA 98 11/04/2017 0918   GFRAA >60 07/20/2019 0208   GFRAA >60 03/03/2019 1432   GFRAA 113 11/04/2017 0918   Iron/TIBC/Ferritin/ %Sat    Component Value Date/Time   IRON 129 07/01/2021 1411   TIBC 328 07/01/2021 1411   FERRITIN 98 07/01/2021 1411   IRONPCTSAT 39 07/01/2021 1411         Assessment & Plan:  66 year old female with a history of chronic constipation/colonic inertia, family history of colon cancer in her mother, family history of colon polyps in her father, pernicious anemia, hypothyroidism, small pancreatic cyst (possible sidebranch IPMN), osteoporosis who is here for follow-up.   Pancreatic cyst/sidebranch IPMN --there is overall stability of her pancreatic cyst at 11 mm.  She did choose to have the MRI/MRCP without gadolinium over concern for  contrast related complication.  After discussion we discussed repeat imaging versus EUS.  I will ask Dr. Pearlie Oyster if they feel EUS would be appropriate for this lesion at this time versus continued radiographic surveillance. --EUS possible versus repeat MRI/MRCP  2.   Pernicious anemia --continue B12 supplementation.  Confirmed previously by antiparietal and anti-intrinsic factor antibody.  EGD deferred by the patient though continues to be recommended. --EGD at the time of next colonoscopy versus at the time of EUS if this is ultimately recommended for #1  3.  Chronic constipation/colonic inertia/change in stool character--she has been treating this with magnesium and dietary supplement.  Most recently GI fortify which contains fiber and aloe.  Unlikely parasitic infection but we will confirm with ova and parasite.  May need periodic bowel purge  with colonoscopy prep.  Patient remains opposed to pharmacologic therapy at this time but that would be a possibility in the future with Linzess, Trulance, Amitiza or Motegrity. --Okay for periodic Suprep or Plenvu if needed for bowel purge  4.  Family history of colon cancer --repeat colonoscopy recommended July 2024  30 minutes total spent today including patient facing time, coordination of care, reviewing medical history/procedures/pertinent radiology studies, and documentation of the encounter.

## 2022-02-12 LAB — OVA AND PARASITE EXAMINATION
CONCENTRATE RESULT:: NONE SEEN
MICRO NUMBER:: 13518918
SPECIMEN QUALITY:: ADEQUATE
TRICHROME RESULT:: NONE SEEN

## 2022-02-18 ENCOUNTER — Other Ambulatory Visit: Payer: Medicare Other

## 2022-03-24 ENCOUNTER — Ambulatory Visit (INDEPENDENT_AMBULATORY_CARE_PROVIDER_SITE_OTHER): Payer: Medicare Other | Admitting: Podiatry

## 2022-03-24 DIAGNOSIS — L600 Ingrowing nail: Secondary | ICD-10-CM

## 2022-03-24 NOTE — Progress Notes (Signed)
Subjective: Chief Complaint  Patient presents with   Ingrown Toenail    bilateral swelling, ingrown great toe nails, which started 6 mths ago, pt has some redness and swelling , No drainage, pt does have some pain,  right hallux is cracked in the middle of the toenail,  TX: soaking in cold water, Vit E oil, pt has been cut the nails also   67 year old female presents for above complaints.  She states that she is not sure if she has ingrown toenails or if the toes are rubbing causing issues.  She states that she also did hit her right big toenail several months ago and a line is formed which is growing out with the nail.  The nails to grow slowly.  She is asked not coming in for routine nail trims.  She follows with Dr. Doran Durand for AVN on the right 2nd metatarsal.   Objective: AAO x3, NAD DP/PT pulses palpable bilaterally, CRT less than 3 seconds Along the hallux toenails mostly the right side there is mild incurvation of the nail distally.  There is a transverse ridge present in the right hallux nail which appears to be growing out from the damage.  The proximal portion of the nail has some incurvation but no significant.  There is no edema, erythema.  There is no drainage or pus.  No fluctuance or crepitation. Mild yellow discoloration. No pain with calf compression, swelling, warmth, erythema  Assessment: Ingrown toenail  Plan: -All treatment options discussed with the patient including all alternatives, risks, complications.  -Ingrown toenail perspective we discussed partial nail avulsions versus trimming.  She was hold off on the procedure but we did discuss this today as well as the postoperative course.  I sharply debrided this intact border without any complications or bleeding.  Recommend Epsom salt soaks and antibiotic ointment.  Monitor for any signs or symptoms of infection or worsening pain.  -Regards to nail fungus previous culture was negative.  Discussed different topicals she  can use.  If needed we can do routine debridement for her as they do cause discomfort at times.  Discussed possibly false negative on the culture. -Patient encouraged to call the office with any questions, concerns, change in symptoms.

## 2022-03-24 NOTE — Patient Instructions (Addendum)
Soak Instructions    THE DAY AFTER THE PROCEDURE  Place 1/4 cup of epsom salts in a quart of warm tap water.  Submerge your foot or feet with outer bandage intact for the initial soak; this will allow the bandage to become moist and wet for easy lift off.  Once you remove your bandage, continue to soak in the solution for 20 minutes.  This soak should be done twice a day.  Next, remove your foot or feet from solution, blot dry the affected area and cover.  You may use a band aid large enough to cover the area or use gauze and tape.  Apply other medications to the area as directed by the doctor such as polysporin neosporin.  IF YOUR SKIN BECOMES IRRITATED WHILE USING THESE INSTRUCTIONS, IT IS OKAY TO SWITCH TO  WHITE VINEGAR AND WATER. Or you may use antibacterial soap and water to keep the toe clean  Monitor for any signs/symptoms of infection. Call the office immediately if any occur or go directly to the emergency room. Call with any questions/concerns. Ingrown Toenail  An ingrown toenail occurs when the corner or sides of a toenail grow into the surrounding skin. This causes discomfort and pain. The big toe is most commonly affected, but any of the toes can be affected. If an ingrown toenail is not treated, it can become infected. What are the causes? This condition may be caused by: Wearing shoes that are too small or tight. An injury, such as stubbing your toe or having your toe stepped on. Improper cutting or care of your toenails. Having nail or foot abnormalities that were present from birth (congenital abnormalities), such as having a nail that is too big for your toe. What increases the risk? The following factors may make you more likely to develop ingrown toenails: Age. Nails tend to get thicker with age, so ingrown nails are more common among older people. Cutting your toenails incorrectly, such as cutting them very short or cutting them unevenly. An ingrown toenail is more  likely to get infected if you have: Diabetes. Blood flow (circulation) problems. What are the signs or symptoms? Symptoms of an ingrown toenail may include: Pain, soreness, or tenderness. Redness. Swelling. Hardening of the skin that surrounds the toenail. Signs that an ingrown toenail may be infected include: Fluid or pus. Symptoms that get worse. How is this diagnosed? Ingrown toenails may be diagnosed based on: Your symptoms and medical history. A physical exam. Labs or tests. If you have fluid or blood coming from your toenail, a sample may be collected to test for the specific type of bacteria that is causing the infection. How is this treated? Treatment depends on the severity of your symptoms. You may be able to care for your toenail at home. If you have an infection, you may be prescribed antibiotic medicines. If you have fluid or pus draining from your toenail, your health care provider may drain it. If you have trouble walking, you may be given crutches to use. If you have a severe or infected ingrown toenail, you may need a procedure to remove part or all of the nail. Follow these instructions at home: Foot care  Check your wound every day for signs of infection, or as often as told by your health care provider. Check for: More redness, swelling, or pain. More fluid or blood. Warmth. Pus or a bad smell. Do not pick at your toenail or try to remove it yourself. Soak your foot   in warm, soapy water. Do this for 20 minutes, 3 times a day, or as often as told by your health care provider. This helps to keep your toe clean and your skin soft. Wear shoes that fit well and are not too tight. Your health care provider may recommend that you wear open-toed shoes while you heal. Trim your toenails regularly and carefully. Cut your toenails straight across to prevent injury to the skin at the corners of the toenail. Do not cut your nails in a curved shape. Keep your feet clean and  dry to help prevent infection. General instructions Take over-the-counter and prescription medicines only as told by your health care provider. If you were prescribed an antibiotic, take it as told by your health care provider. Do not stop taking the antibiotic even if you start to feel better. If your health care provider told you to use crutches to help you move around, use them as instructed. Return to your normal activities as told by your health care provider. Ask your health care provider what activities are safe for you. Keep all follow-up visits. This is important. Contact a health care provider if: You have more redness, swelling, pain, or other symptoms that do not improve with treatment. You have fluid, blood, or pus coming from your toenail. You have a red streak on your skin that starts at your foot and spreads up your leg. You have a fever. Summary An ingrown toenail occurs when the corner or sides of a toenail grow into the surrounding skin. This causes discomfort and pain. The big toe is most commonly affected, but any of the toes can be affected. If an ingrown toenail is not treated, it can become infected. Fluid or pus draining from your toenail is a sign of infection. Your health care provider may need to drain it. You may be given antibiotics to treat the infection. Trimming your toenails regularly and properly can help you prevent an ingrown toenail. This information is not intended to replace advice given to you by your health care provider. Make sure you discuss any questions you have with your health care provider. Document Revised: 12/11/2020 Document Reviewed: 12/11/2020 [employer] Patient Education  2023 [employer]  

## 2022-03-26 ENCOUNTER — Ambulatory Visit
Admission: RE | Admit: 2022-03-26 | Discharge: 2022-03-26 | Disposition: A | Discharge status: Home / Self Care | Payer: Medicare Other | Source: Ambulatory Visit | Attending: Otolaryngology | Admitting: Otolaryngology

## 2022-03-26 DIAGNOSIS — J329 Chronic sinusitis, unspecified: Secondary | ICD-10-CM

## 2022-04-07 ENCOUNTER — Ambulatory Visit
Admission: RE | Admit: 2022-04-07 | Discharge: 2022-04-07 | Disposition: A | Discharge status: Home / Self Care | Payer: Medicare Other | Source: Ambulatory Visit | Attending: Physician Assistant | Admitting: Physician Assistant

## 2022-04-07 ENCOUNTER — Other Ambulatory Visit: Payer: Self-pay | Admitting: Physician Assistant

## 2022-04-07 DIAGNOSIS — R0781 Pleurodynia: Secondary | ICD-10-CM

## 2022-04-08 ENCOUNTER — Ambulatory Visit: Payer: Medicare Other | Admitting: Internal Medicine

## 2022-04-09 ENCOUNTER — Encounter: Payer: Self-pay | Admitting: Internal Medicine

## 2022-04-09 ENCOUNTER — Ambulatory Visit (INDEPENDENT_AMBULATORY_CARE_PROVIDER_SITE_OTHER): Payer: Medicare Other | Admitting: Internal Medicine

## 2022-04-09 VITALS — BP 120/76 | HR 90 | Ht 69.0 in | Wt 132.8 lb

## 2022-04-09 DIAGNOSIS — E039 Hypothyroidism, unspecified: Secondary | ICD-10-CM | POA: Diagnosis not present

## 2022-04-09 DIAGNOSIS — E162 Hypoglycemia, unspecified: Secondary | ICD-10-CM | POA: Diagnosis not present

## 2022-04-09 DIAGNOSIS — M810 Age-related osteoporosis without current pathological fracture: Secondary | ICD-10-CM

## 2022-04-09 LAB — POCT GLYCOSYLATED HEMOGLOBIN (HGB A1C): Hemoglobin A1C: 4.9 % (ref 4.0–5.6)

## 2022-04-09 MED ORDER — ARMOUR THYROID 30 MG PO TABS: 30.0000 mg | ORAL_TABLET | Freq: Every day | ORAL | 3 refills | Status: AC

## 2022-04-09 NOTE — Progress Notes (Signed)
Patient ID: Megan [name], female   DOB: 28-Oct-1955, 66 y.o.   MRN: [id number]   HPI  Megan [name] is a 66 y.o.-year-old female, initially referred by her OB/GYN doctor, Dr. Ronita [name], returning for follow-up for osteoporosis and hypothyroidism.  Last visit 1 year ago.  Interim history: No falls or fractures since last visit.  She is now back to exercising at the gym. Since last visit, she was found to have incidental thyroid calcification on CT neck.  An ultrasound did not show nodules, also, she had an atrophic thyroid.   She has constipation, which exacerbated after she increased her almond milk intake to 3 cups a day.  She stopped milk since. She had COVID-15 days ago.  She recovered well but has pleurisy pain. CXR was clear. She sees pulmonology.  Perceived hypoglycemia  Reviewed history: At the end of last 2021, she described that she had dental surgery for a longstanding infection and could only eat soft foods for a period of 3 weeks.  She also cut down carbs after she was found to have a high cholesterol level.  She started to feel poorly with dizziness and flushing and finally presented to the emergency room 07/03/2020.  After being given glucose, sugars improved.  At home, she continued to monitor her blood sugars consistently and she did not have blood sugars lower than 62.Reviewing her detailed diet and blood sugar records, they ranged between 62 (although was, before lunch) to 117.  I reassured her that this was not abnormal.  Reviewed message from patient from 08/23/2020: I am so  sorry to bother you but I am not feeling well and am a little scared - I was able to get an appointment with you for Monday, 1/3 but if you have any suggestions for me over the weekend, I would appreciate it.  My blood sugar at 5AM when I woke up was 112 (I ate well yesterday and had oatmeal with cinnamon at 7PM which was my last food of the day).  I wasn't hungry so didn't eat breakfast which is not unusual  for me and at noon it was 61.  I made his a large smoothie (cranberries, avocado, banana, greens, 2 cups almond milk, protein powder) and at 1:30PM, my sugar is 58 and I am dizzy and my eyes are off.  I just drank 3/4 of a coke.  I am eating really well and don't understand why it is high when I wake up and so low 1.5 hours after eating lunch.  I am being really careful to not eat chocolate covered things (which I sometimes enjoy) and higher fat things like almond butter, but this "clean" diet seems to get my sugar really low.  If you see this before Monday and have any suggestions outside of coke or sugar pills, I would appreciate it.  Thanks!  Megan [name]  We discussed about improving diet at our visit from 08/2020.  I made specific suggestions to avoid postprandial hypoglycemia.  I recommended against a CGM.  She continues to see multiple specialists.  Since last visit, she saw a specialist at [facility] and had a CGM placed.  Sugars were all at goal so she is now off the CGM.  Also, since last visit she had a normal cosyntropin stimulation test ordered by the provider at [facility] (02/05/2021): Time 0: ACTH 18.3, cortisol 9.3 Time 60 minutes: Cortisol 23.7  She was diagnosed with osteoporosis in 2006, but she had lower BMD even before menopause  in 2004.  She continues to follow-up with a bone specialist in [place] (Dr. Marcelino [name]) but would also want to continue to follow-up with me for this problem.  Reviewed previous DXA scans: Date L1-L4 T score FN T score 33% distal Radius  03/31/2022 [facility]) -2.8 RFN: -2.5 LFN: -2.4 -4.4  11/25/2021 [facility]) -3.0 RFN: ? LFN: -2.7 ?  10/01/2021 [facility]) -2.7 ? -4.4  09/18/2021 [facility]) -2.8 RFN: ? LFN: -2.2 ?  03/01/2021 [facility] [place]) -3.9 (-6.3%*) RFN: -2.8 LFN: -2.4 -4.5  11/09/2019 [facility] [place]) -3.5 (-0.4%) RFN: -2.4  LFN: -2.4 n/a  11/03/2018 [facility] [place]) -3.5 (-2.8%) RFN: -2.3 LFN: -2.1  n/a  11/10/2017 [facility] [place]) -2.3 RFN: n/a LFN:  n/a n/a  08/13/2017 -3.3 (-4.4%*) RFN: -2.0 LFN: -1.8 n/a  08/10/2015 -3.0 RFN: -2.0 LFN: -2.2 n/a   Other reports reviewed Per records brought by patient: L1-L4: 06/2013: -3.5 10/2007: -3.1 07/2005: -2.5 05/2003: -2.2  RFN: 06/2013: -2.2 10/2007: -2.0 07/2005: -2.1 05/2003: -1.8  LFN: 06/2013: -2.2 10/2007: -2.1 07/2005: -2.2 05/2003: -1.9  She had 1 fracture: - 07/2016: Right rib  03/2022: CTX 276 03/19/2022: NTX 67  No recent falls or fractures.  Reviewed previous osteoporosis therapy: - Fosamax and Actonel - 2001-2004 (no help) - Estradiol 0.0125 mg + Progesterone - 2014-2016 (improvement in spine BMD) >> she  just started a combination of Estradiol-Prometrium-Testosterone 0.06-5-0.33 mg/gtt - 4 drops per day She previously refused other medications for osteoporosis. - However, in 02/2021, she saw Dr. Marcelino [name] and he recommended again Evenity.  She started Evenity 02/2021 >> 07/2021 (b/c signif. Mm and joint aches), then 3 months off, then 2 half doses. Plan is to continue half dose until 11/2022.  No history of vitamin D deficiency.  Reviewed previous vitamin D levels: 07/26//2023: vitamin D 56.2 Lab Results  Component Value Date   VD25OH 63.8 02/29/2020  07/14/2019: Vitamin D 57 01/27/2019: Vitamin D 50.7-on 5000 units vitamin D daily 03/29/2018: Vitamin D 82.5 09/2017: Vitamin D 72 06/17/2016: Vitamin D 35  She takes vitamin K2 +5000 units (now 12000 units daily during Covid) vitamin D daily.  She continues to exercise consistently.   She tried [place] >> got hurt - also had a HA that lasted 3 weeks.  She retried this earlier in 2021 and she again hurt herself and developed a headache.  No history of repeated steroid courses.  In 2018, she ruled out for multiple myeloma by protein electrophoresis.  She has polyclonal gammopathy and follows with hematology.  She was also investigated by nephrology for proteinuria.  Menopause was at 66 years  old.  Pt does have a FH of osteoporosis: M - had spinal fx's, father - Lupron.  She was seeing Dr. Knox Saliva >> then switched to another functional medicine doctor. She was told she had mold in her house, low WBC, tested positive for heavy metals, iron is low (she had a recent low ferritin level, also - 01/2019).    No history of kidney stones, hyper or hypocalcemia or hyperparathyroidism: Lab Results  Component Value Date   CALCIUM 9.4 07/29/2021   CALCIUM 9.0 04/22/2021   CALCIUM 9.0 04/12/2021   CALCIUM 9.5 07/01/2020   CALCIUM 9.6 05/29/2020   CALCIUM 9.3 12/20/2019   CALCIUM 9.5 07/20/2019   CALCIUM 9.5 07/16/2019   CALCIUM 9.7 03/03/2019   CALCIUM 9.2 08/22/2018  02/03/2019: calcium 9.7 03/29/2018: PTH 48  No history of CKD.  Latest BUN/creatinine: 03/19/2022: 11/0.67 Lab Results  Component Value Date  BUN 13 07/29/2021   CREATININE 0.67 07/29/2021   Hypothyroidism.  She was initially on Nature-Throid, then she had to switch to Armour due to [employer].  She noticed some hair loss after switching, but no other symptoms.  In 2019, she was taken off the medication by one of her providers but she felt terrible and restarted.  Reviewed her TFTs: 03/19/2022: TSH 1.59 Lab Results  Component Value Date   TSH 2.60 03/06/2021  01/27/2019: TSH: 2.21 Lab Results  Component Value Date   TSH 2.78 06/30/2018  03/29/2018: TSH 3.17, free T4 0.75, free T3 3.0 02/10/2018: TSH 1.89, free T4 0.9, free T3 2.2 11/04/2017: TSH 2.74, free T4 0.59, free T3 5.2 08/14/2017: TSH 1.780, fT4 1.08 - pt was on Biotin 10,000 mcg when labs drawn; Selenium and Urinary iodine normal 07/19/2008: TSH 6.76  On Armour 30 mg daily, increased 09/2021, by her Integrative Med Provider - in am - fasting - at least 30 min from b'fast - no calcium - no iron - no multivitamins - no PPIs - not on Biotin  She is on multiple other supplements, including alpha-lipoic acid, co-Q10, 5 HTP, vitamin B12,  probiotics, magnesium, etc. We stopped her vitamin A since this was found to exacerbate osteoporosis. She stopped the supplement and also her multivitamins due to elevated B6 vitamin level.  On 02/10/2018, her vitamin B6 was 10.9 (2.0-32.8), normal. In 2019, she  went to the emergency room for chest pain after she started a natural supplement (nitric balance-ATP), which she stopped since.  Elevated estradiol:  Reviewed history: She had previously undetectable estradiol levels in 07/25/2013, 08/21/2014, 05/05/2016, 06/03/2016, however, in 04/25/2017, her level was 44.3 pg/mL.  At that time, she was giving estriol to her dog without gloves.  She started to use gloves and her level became undetectable again.  She then started to reuse the same gloves and in 03/23/2018, the level was 197.8 pg/mL.  She started to use gloves only once for administration and her level decreased to 101.8 on 03/29/2018.  She was very worried about this despite reassurance that the pattern of estrogen increase was consistent with interference with the assay rather than a tumoral source. She had imaging tests that initially showed an ovarian cyst which resolved. The elevated estrogen was deemed to be due to interference with the assay from her high-dose biotin.  She has a history of pernicious anemia and IDA.  She got iron infusions:  Ferritin 17 >> 50.  More recent ferritin was also normal. She has a stable 8 mm pancreatic cyst-no intervention needed, only repeat MRI for follow-up. She continues to have chronic fatigue.  She saw infectious disease. She has a history of avascular necrosis in her right foot.  ROS: + See HPI  Past Medical History:  Diagnosis Date   Allergic rhinitis    Allergy    Aneurysm of cardiac wall, congenital    a.  septal aneurysm extending into the RVOT (by cardiac MRI in 08/2013)   Anxiety    Aortic atherosclerosis (HCC)    Arthritis    Asthma    Basal cell carcinoma    Depression    Heart murmur     Hypothyroidism    on meds   Leukopenia    idiopathic   Lyme disease    Mitral valve prolapse    MVP (mitral valve prolapse)    Orthostatic hypotension    Osteoporosis    Pancreatic cyst    Peripheral neuropathy 06/09/2012  Pernicious anemia    Pernicious anemia    Pernicious anemia    Plantar fasciitis    Pneumothorax    Past Surgical History:  Procedure Laterality Date   COLONOSCOPY  2019   JMP-   COLONOSCOPY WITH PROPOFOL N/A 02/22/2013   Procedure: COLONOSCOPY WITH PROPOFOL;  Surgeon: Garlan Fair, MD;  Location: [facility];  Service: Endoscopy;  Laterality: N/A;   MOUTH SURGERY  01/2012   bone graft in mouth   NASAL SINUS SURGERY Right 06/12/2014   Procedure: RIGHT ENDOSCOPIC MAXILLARY ANTROSTOMY;  Surgeon: Ascencion Dike, MD;  Location: [place];  Service: ENT;  Laterality: Right;   ROOT CANAL  02/27/2012   TONSILLECTOMY  1962   WISDOM TOOTH EXTRACTION     Social History   Socioeconomic History   Marital status: Divorced    Spouse name: Not on file   Number of children: 0   Years of education: Not on file   Highest education level: Not on file  Occupational History    Comment: Career and life coach  Tobacco Use   Smoking status: Never   Smokeless tobacco: Never  Vaping Use   Vaping Use: Never used  Substance and Sexual Activity   Alcohol use: No    Alcohol/week: 0.0 standard drinks of alcohol   Drug use: No   Sexual activity: Not Currently  Other Topics Concern   Not on file  Social History Narrative   Patient is single and lives alone.   Patient is self-employed, career and life coaching.   Patient drinks two to four cups of caffeine daily.   Right handed    Social Determinants of Health   Financial Resource Strain: Not on file  Food Insecurity: Not on file  Transportation Needs: Not on file  Physical Activity: Not on file  Stress: Not on file  Social Connections: Not on file  Intimate Partner Violence: Not on file    Current Outpatient Medications on File Prior to Visit  Medication Sig Dispense Refill   5-Hydroxytryptophan (5-HTP) 100 MG CAPS Take 100 mg by mouth daily.      albuterol (VENTOLIN HFA) 108 (90 Base) MCG/ACT inhaler Inhale 1 puff into the lungs daily as needed for wheezing or shortness of breath.      ARMOUR THYROID 30 MG tablet Take 30 mg by mouth daily. Patient taking 76m 4 days a week  6   Ascorbic Acid (VITAMIN C PO) Take 2,500 Units by mouth daily.      Ascorbic Acid (VITAMIN C) POWD Take 2,500 mg by mouth daily.     Biotin 10000 MCG TABS Take 10,000 mcg by mouth daily.     Cholecalciferol (VITAMIN D3) 10000 units capsule Take 10,000 Units by mouth daily.      Cyanocobalamin (VITAMIN B-12 ER PO) Take 5,000 mcg by mouth daily.      Digestive Enzymes (BETAINE HCL) 650-130 MG CAPS Take 650 mg by mouth 2 (two) times daily. (Patient not taking: Reported on 02/04/2022)     estradiol (ESTRACE) 0.1 MG/GM vaginal cream Place 1 Applicatorful vaginally 3 (three) times a week.     hydrocortisone valerate cream (WESTCORT) 0.2 % Apply 1 application  topically 2 (two) times daily.     LORazepam (ATIVAN) 0.5 MG tablet Take 0.25-0.5 mg by mouth daily as needed.     MAGNESIUM CITRATE PO Take 600 mg by mouth daily.     Menaquinone-7 (VITAMIN K2 PO) Take 1 tablet by mouth daily.  metroNIDAZOLE (METROGEL) 0.75 % gel Apply 1 application  topically 2 (two) times daily.     NON FORMULARY Take 1,000 mcg by mouth daily. Methyl Folate chewable tablet     NONFORMULARY OR COMPOUNDED ITEM Apply 2 drops topically in the morning. Estradiol-Progesterone 0.06-5-0.33 MG liquid drops     progesterone (PROMETRIUM) 100 MG capsule Take 0.5 capsules by mouth daily.     Romosozumab-aqqg (EVENITY) 105 MG/1.17ML SOSY injection      VITAMIN A PO Take 900 mcg by mouth daily.     No current facility-administered medications on file prior to visit.   Allergies  Allergen Reactions   Augmentin [Amoxicillin-Pot Clavulanate]  Diarrhea   Clindamycin/Lincomycin Diarrhea   Iron     Other reaction(s): severe constipation   Moxifloxacin Other (See Comments)    neuropathy Other reaction(s): neuropathy   Family History  Problem Relation Age of Onset   Hyperlipidemia Father    Heart failure Father    Prostate cancer Father 68   Kidney failure Father    Hypertension Father    Colon polyps Father    Angina Mother    Hypertension Mother    Lung cancer Mother 69       again at 42/79-smoker   Colon cancer Mother 74   Colon polyps Mother    Lung cancer Paternal Grandfather    Breast cancer Paternal Grandmother    Diabetes Paternal Grandmother    Esophageal cancer Neg Hx    Liver cancer Neg Hx    Pancreatic cancer Neg Hx    Rectal cancer Neg Hx    Stomach cancer Neg Hx     PE: BP 120/76 (BP Location: Right Arm, Patient Position: Sitting, Cuff Size: Normal)   [name] 90   Ht '5\' 9"'  (1.753 m)   Wt 132 lb 12.8 oz (60.2 kg)   SpO2 99%   BMI 19.61 kg/m  Wt Readings from Last 3 Encounters:  04/09/22 132 lb 12.8 oz (60.2 kg)  02/04/22 131 lb 2 oz (59.5 kg)  07/29/21 132 lb 6.4 oz (60.1 kg)   Constitutional: normal weight, in NAD Eyes: EOMI, no exophthalmos ENT: moist mucous membranes, no thyromegaly, no cervical lymphadenopathy Cardiovascular: RRR, No MRG Respiratory: CTA B Musculoskeletal: no deformities Skin: moist, warm, no rashes Neurological: no tremor with outstretched hands  Assessment: 1. Osteoporosis  2.  Hypothyroidism  3.  Perceived hypoglycemia  4. Elevated estrogen -This was due to biotin interference with the assay -No further follow-up is needed for this  5.  Thyroid calcifications  Other treating physicians:  Dr. Ronita [name]  Dr. Marcelino [name]  Dr. Paulla Fore  Dr. August Luz [facility] [place] [address]  Plan: 1. Osteoporosis -The age-related/postmenopausal and she also has a family history of osteoporosis.  She feels that the fact that she was previously  drinking a lot of coffee also contributed. -No falls or fractures since last visit -Reviewed her DXA scan reports from 2018, 2020 and 2021-stable at the spine, however, decreased T-scores at the right and left femoral neck on the last report.  In 2022, the bone density obtained by Dr. Marcelino [name] showed worsening spine BMD, at -3.9, and also worse right femoral neck T-scores.  At the level of the 33% distal radius, she has a very low T score, of -4.5.  We discussed that these translate into a significantly increased risk for fracture.  She is also prone to falls due to her peripheral neuropathy.  This year, she had 4 (!)  Bone density scans -due to starting Evenity, then stopping it because of intolerance (muscle and joint aches), then restarting it at the half dose.  Plan is to continue with the half dose until 11/2022.  She would like my opinion about how to continue afterwards.  My recommendation was 1-2 doses of Reclast.  She was interested whether or low-dose HRT may offer protection instead of Reclast, but this would be very unlikely.  We also discussed about the risk of the medicines used for osteoporosis treatment (besides bisphosphonates), and none of them can be stopped abruptly.  -She previously refused antiresorptive medications and anabolic agents.  However, she also saw several other providers, one of them recommending Evenity (Dr. Marcelino [name]).  We discussed at length about this at last visit.   -She started [facility] 02/2021 -tolerated well -She continues on many supplements for bone maintenance, originally started by her integrative medicine provider.  In the past, we stopped vitamin A since excess of these can cause osteoporosis and increased fractures. -She tried to go to [facility] but she developed headaches and muscle aches and had to stop.  She retried this with the same results. -She is on HRT by another provider (compounded hormonal supplement including estrogen, testosterone,  progesterone).  She also continues vibration platform and weightbearing and balance exercises.  She is walking daily. -Latest vitamin D level was normal in 02/2022 per labs brought by the patient -she is usually on 5000 units vitamin D + K2 daily (higher dose, 12,000 units daily during COVID) -she asks me whether to continue with 10,000 units a day afterwards and I advised her against this, discussing the risk of overdosing vitamin D -I will see her back in 1 year  2.  Perceived hypothyroidism -Previously on thyroid hormones, started based on symptoms, but without a clear history of hypothyroidism.  Since her TFTs remained normal afterwards, she continued supplementation. - latest thyroid labs reviewed with pt. >> normal: Lab Results  Component Value Date   TSH 2.60 03/06/2021  - she continues on Armour 30 mg daily, increase by her integrative medicine provider in 09/2021 - pt feels good on this dose. - we discussed about taking the thyroid hormone every day, with water, >30 minutes before breakfast, separated by >4 hours from acid reflux medications, calcium, iron, multivitamins. Pt. is taking it correctly. - will check thyroid tests today: TSH, fT3, and fT4  3.  Hypoglycemia -Likely with a component of idiopathic postprandial syndrome (symptoms of hypoglycemia without a low glucose) -She has a history of low blood sugars in 06/2019 3:01 weeks in which she could not eat well after periodontal surgery.  She is usually eating a plant-based diet.   -She initially had a CGM, but I advised her against this, since this usually overestimates the low blood sugars and causes unnecessary anxiety in this context.  Before last visit, another provider added a CGM again blood sugars were low normal and this was stopped. -Latest HbA1c was 5.1% a year ago. -We did discuss about dietary changes including adding breakfast, staying with low glycemic index foods, eating fruits and veggies, not drinking liquids  with meals, starting the meal with protein and fat and ending with carbs, and also carrying a snack with her everywhere.  I also gave her written instructions about the 15-15 rule for hypoglycemia correction.  She was previously spending a significant amount of time calculating exactly how many carbs, protein, and fats she was eating and also documenting her blood sugars multiple  times a day.  I advised her that this was unnecessary.  I did try to refer her to nutrition but this appointment was very expensive ($400). -She had a cosyntropin stimulation test ordered by another provider on 02/05/2021 and this was normal. -No new hypoglycemic episodes -she mentions that in the morning, sugars can be up to 105 -discussed that this may be within the error of the matter, and not a cause for alarm.  Explained why sugars are usually higher in the morning -patient logically higher concentrations of stress hormones -We will check another HbA1c today >> normal: Lab Results  Component Value Date   HGBA1C 4.9 04/09/2022   - Total time spent for the visit: 40 min, in precharting, obtaining medical information from the chart and from the patient, reviewing her  previous labs, imaging evaluations, and treatments, also, reviewing records brought by patient, reviewing her symptoms, counseling her about her conditions (please see the discussed topics above), and developing a plan to further investigate and treat them; she had a number of questions which I addressed.   Philemon Kingdom, MD PhD [facility] Endocrinology

## 2022-04-09 NOTE — Patient Instructions (Addendum)
Please continue Armour 30 mg daily.  Take the thyroid hormone every day, with water, at least 30 minutes before breakfast, separated by at least 4 hours from: - acid reflux medications - calcium - iron - multivitamins  Please come back for a follow-up appointment in 1 year.

## 2022-04-10 ENCOUNTER — Ambulatory Visit
Admission: RE | Admit: 2022-04-10 | Discharge: 2022-04-10 | Disposition: A | Discharge status: Home / Self Care | Payer: Medicare Other | Source: Ambulatory Visit | Attending: Physician Assistant | Admitting: Physician Assistant

## 2022-04-10 ENCOUNTER — Other Ambulatory Visit: Payer: Self-pay | Admitting: Physician Assistant

## 2022-04-10 DIAGNOSIS — R10811 Right upper quadrant abdominal tenderness: Secondary | ICD-10-CM

## 2022-04-10 DIAGNOSIS — M25511 Pain in right shoulder: Secondary | ICD-10-CM

## 2022-04-11 ENCOUNTER — Encounter (HOSPITAL_BASED_OUTPATIENT_CLINIC_OR_DEPARTMENT_OTHER): Payer: Self-pay | Admitting: Emergency Medicine

## 2022-04-11 ENCOUNTER — Ambulatory Visit
Admission: RE | Admit: 2022-04-11 | Discharge: 2022-04-11 | Disposition: A | Discharge status: Home / Self Care | Payer: Medicare Other | Source: Ambulatory Visit | Attending: Internal Medicine | Admitting: Internal Medicine

## 2022-04-11 ENCOUNTER — Other Ambulatory Visit: Payer: Self-pay | Admitting: Internal Medicine

## 2022-04-11 ENCOUNTER — Other Ambulatory Visit: Payer: Self-pay

## 2022-04-11 DIAGNOSIS — Z79899 Other long term (current) drug therapy: Secondary | ICD-10-CM | POA: Insufficient documentation

## 2022-04-11 DIAGNOSIS — R0602 Shortness of breath: Secondary | ICD-10-CM

## 2022-04-11 DIAGNOSIS — J209 Acute bronchitis, unspecified: Secondary | ICD-10-CM | POA: Insufficient documentation

## 2022-04-11 LAB — BASIC METABOLIC PANEL
Anion gap: 9 (ref 5–15)
BUN: 9 mg/dL (ref 8–23)
CO2: 29 mmol/L (ref 22–32)
Calcium: 9.7 mg/dL (ref 8.9–10.3)
Chloride: 102 mmol/L (ref 98–111)
Creatinine, Ser: 1.05 mg/dL — ABNORMAL HIGH (ref 0.44–1.00)
GFR, Estimated: 58 mL/min — ABNORMAL LOW (ref >60)
Glucose, Bld: 102 mg/dL — ABNORMAL HIGH (ref 70–99)
Potassium: 3.5 mmol/L (ref 3.5–5.1)
Sodium: 140 mmol/L (ref 135–145)

## 2022-04-11 LAB — CBC
HCT: 40.4 % (ref 36.0–46.0)
Hemoglobin: 13.3 g/dL (ref 12.0–15.0)
MCH: 30.6 pg (ref 26.0–34.0)
MCHC: 32.9 g/dL (ref 30.0–36.0)
MCV: 92.9 fL (ref 80.0–100.0)
Platelets: 189 10*3/uL (ref 150–400)
RBC: 4.35 MIL/uL (ref 3.87–5.11)
RDW: 12.7 % (ref 11.5–15.5)
WBC: 3.8 10*3/uL — ABNORMAL LOW (ref 4.0–10.5)
nRBC: 0 % (ref 0.0–0.2)

## 2022-04-11 LAB — TROPONIN I (HIGH SENSITIVITY): Troponin I (High Sensitivity): 2 ng/L (ref <18)

## 2022-04-11 MED ORDER — IOPAMIDOL (ISOVUE-370) INJECTION 76%
75.0000 mL | Freq: Once | INTRAVENOUS | Status: AC | PRN
  Administered 2022-04-11: 75 mL via INTRAVENOUS

## 2022-04-11 MED ORDER — IPRATROPIUM-ALBUTEROL 0.5-2.5 (3) MG/3ML IN SOLN
RESPIRATORY_TRACT | Status: AC
  Administered 2022-04-11: 3 mL via RESPIRATORY_TRACT
  Filled 2022-04-11: qty 3

## 2022-04-11 MED ORDER — IPRATROPIUM-ALBUTEROL 0.5-2.5 (3) MG/3ML IN SOLN: 3.0000 mL | Freq: Once | RESPIRATORY_TRACT | Status: AC

## 2022-04-11 NOTE — ED Notes (Signed)
RT assessed pt for SOB. Pt states she has chest tightness and has a hx of asthma. RT gave duo nebulizer. Pt verbalizes her chest tightness feels much better and she is breathing better at this time. BBS clear throughout all lung fields, with no distress noted at this time. RT will continue to monitor while in ED.

## 2022-04-11 NOTE — ED Triage Notes (Signed)
  Patient comes in with chest pain and SOB that has been going on since she was diagnosed with covid on 8/4.  Patient had outpatient CTA done this morning that was negative.  Patient states she still has chest discomfort on L upper chest and is intermittently SOB.  Denies N/V, diaphoresis, or syncope.  Patient states pain is in upper chest and radiates to shoulder blades, feels like pressure on chest.  Pain 4/10, pressure.

## 2022-04-11 NOTE — ED Notes (Signed)
  Patient refused CXR.  States she wanted to talk with the EDP before getting any additional radiation.

## 2022-04-12 ENCOUNTER — Emergency Department (HOSPITAL_BASED_OUTPATIENT_CLINIC_OR_DEPARTMENT_OTHER)
Admission: EM | Admit: 2022-04-12 | Discharge: 2022-04-12 | Disposition: A | Discharge status: Home / Self Care | Payer: Medicare Other | Attending: Emergency Medicine | Admitting: Emergency Medicine

## 2022-04-12 DIAGNOSIS — R079 Chest pain, unspecified: Secondary | ICD-10-CM

## 2022-04-12 DIAGNOSIS — J209 Acute bronchitis, unspecified: Secondary | ICD-10-CM | POA: Diagnosis not present

## 2022-04-12 DIAGNOSIS — J40 Bronchitis, not specified as acute or chronic: Secondary | ICD-10-CM

## 2022-04-12 LAB — TROPONIN I (HIGH SENSITIVITY): Troponin I (High Sensitivity): 2 ng/L (ref <18)

## 2022-04-12 MED ORDER — ALBUTEROL SULFATE HFA 108 (90 BASE) MCG/ACT IN AERS: 2.0000 | INHALATION_SPRAY | RESPIRATORY_TRACT | Status: DC | PRN

## 2022-04-12 MED ORDER — ALBUTEROL SULFATE HFA 108 (90 BASE) MCG/ACT IN AERS
INHALATION_SPRAY | RESPIRATORY_TRACT | Status: AC
  Administered 2022-04-12: 2 via RESPIRATORY_TRACT
  Filled 2022-04-12: qty 6.7

## 2022-04-12 MED ORDER — PREDNISONE 10 MG (21) PO TBPK: ORAL_TABLET | ORAL | 0 refills | Status: DC

## 2022-04-12 MED ORDER — IPRATROPIUM-ALBUTEROL 0.5-2.5 (3) MG/3ML IN SOLN: 3.0000 mL | Freq: Once | RESPIRATORY_TRACT | Status: AC

## 2022-04-12 MED ORDER — AEROCHAMBER PLUS FLO-VU MISC
1.0000 | Freq: Once | Status: AC
  Administered 2022-04-12: 1
  Filled 2022-04-12: qty 1

## 2022-04-12 MED ORDER — PREDNISONE 50 MG PO TABS
60.0000 mg | ORAL_TABLET | Freq: Once | ORAL | Status: DC
  Filled 2022-04-12: qty 1

## 2022-04-12 MED ORDER — IPRATROPIUM-ALBUTEROL 0.5-2.5 (3) MG/3ML IN SOLN: 3.0000 mL | Freq: Four times a day (QID) | RESPIRATORY_TRACT | 0 refills | Status: DC | PRN

## 2022-04-12 MED ORDER — IPRATROPIUM-ALBUTEROL 0.5-2.5 (3) MG/3ML IN SOLN
RESPIRATORY_TRACT | Status: AC
  Administered 2022-04-12: 3 mL via RESPIRATORY_TRACT
  Filled 2022-04-12: qty 3

## 2022-04-12 NOTE — ED Notes (Signed)
Pt agreeable with d/c plan as discussed by provider with exception of taken the '60mg'$  PO Prednisone ordered prior to d/c - pt states she would like to speak to her orthopedist first due to h/o osteoporosis.  This nurse has verbally reinforced d/c instructions and provided pt with written copy- pt acknowledges verbal understanding and denies any additional questions, concerns, needs.

## 2022-04-12 NOTE — ED Notes (Signed)
RT educated pt on proper use of MDI w/spacer. Pt able to perform without difficulty.

## 2022-04-12 NOTE — ED Notes (Signed)
Pt called in RN due to cardiac monitor showing pt was in afib. Pt appeared worried; pt denied CP, however pt rated chest tightness at 1/10. Pt placed on secondary leads to perform EKG to show to provider. EKG will be performed.

## 2022-04-12 NOTE — ED Provider Notes (Signed)
[facility]  Provider Note  CSN: [id number] Arrival date & time: 04/11/22 1953  History Chief Complaint  Patient presents with   Chest Pain   Shortness of Breath    Megan [name] is a 66 y.o. female with prior diagnosis of 'adult onset asthma' after an admission for pneumonia in 2019 rarely uses an inhaler and has had normal PFTs in the meantime. She was diagnosed with Covid earlier this month, at the time her symptoms were not primarily respiratory but in the last week she has had left and right upper chest tightness, some SOB, cough without fever. Seen by PCP who has done CXR, [place] and CTA chest which were all negative. She reported her symptoms were worsening this evening prompting her to come to the ED. She was given a neb treatment prior to my evaluation and reports significant improvement.    Home Medications Prior to Admission medications   Medication Sig Start Date End Date Taking? Authorizing Provider  predniSONE (STERAPRED UNI-PAK 21 TAB) 10 MG (21) TBPK tablet '10mg'$  Tabs, 6 day taper. Use as directed 04/12/22  Yes Truddie Hidden, MD  5-Hydroxytryptophan (5-HTP) 100 MG CAPS Take 100 mg by mouth daily.     [provider]  albuterol (VENTOLIN HFA) 108 (90 Base) MCG/ACT inhaler Inhale 1 puff into the lungs daily as needed for wheezing or shortness of breath.  05/24/20   [provider]  ARMOUR THYROID 30 MG tablet Take 1 tablet (30 mg total) by mouth daily. 04/09/22   Philemon Kingdom, MD  Ascorbic Acid (VITAMIN C) POWD Take 2,500 mg by mouth daily.    [provider]  Cholecalciferol (VITAMIN D3) 10000 units capsule Take 10,000 Units by mouth daily.     [provider]  Cyanocobalamin (VITAMIN B-12 ER PO) Take 5,000 mcg by mouth daily.     [provider]  estradiol (ESTRACE) 0.1 MG/GM vaginal cream Place 1 Applicatorful vaginally 3 (three) times a week.    [provider]  LORazepam (ATIVAN) 0.5  MG tablet Take 0.25-0.5 mg by mouth daily as needed. 12/06/21   [provider]  MAGNESIUM CITRATE PO Take 600 mg by mouth daily.    [provider]  Menaquinone-7 (VITAMIN K2 PO) Take 1 tablet by mouth daily.    [provider]  NON FORMULARY Take 1,000 mcg by mouth daily. Methyl Folate chewable tablet    [provider]  NONFORMULARY OR COMPOUNDED ITEM Apply 2 drops topically in the morning. Estradiol-Progesterone 0.06-5-0.33 MG liquid drops    [provider]  progesterone (PROMETRIUM) 100 MG capsule Take 0.5 capsules by mouth daily.    [provider]  Romosozumab-aqqg (EVENITY) 105 MG/1.17ML SOSY injection  03/07/21   [provider]     Allergies    Augmentin [amoxicillin-pot clavulanate], Clindamycin/lincomycin, Iron, and Moxifloxacin   Review of Systems   Review of Systems Please see HPI for pertinent positives and negatives  Physical Exam BP 111/67   Pulse 64   Temp 97.8 F (36.6 C) (Oral)   Resp 12   Ht '5\' 9"'$  (1.753 m)   Wt 59.9 kg   SpO2 100%   BMI 19.49 kg/m   Physical Exam Vitals and nursing note reviewed.  Constitutional:      Appearance: Normal appearance.  HENT:     Head: Normocephalic and atraumatic.     Nose: Nose normal.     Mouth/Throat:     Mouth: Mucous membranes are moist.  Eyes:     Extraocular Movements: Extraocular movements intact.     Conjunctiva/sclera: Conjunctivae normal.  Cardiovascular:     Rate and Rhythm: Normal rate.  Pulmonary:     Effort: Pulmonary effort is normal.     Breath sounds: Rales present. No wheezing.  Abdominal:     General: Abdomen is flat.     Palpations: Abdomen is soft.     Tenderness: There is no abdominal tenderness.  Musculoskeletal:        General: No swelling. Normal range of motion.     Cervical back: Neck supple.  Skin:    General: Skin is warm and dry.  Neurological:     General: No focal deficit present.     Mental Status: She is alert.   Psychiatric:        Mood and Affect: Mood normal.     ED Results / Procedures / Treatments   EKG EKG Interpretation  Date/Time:  Saturday April 12 2022 01:10:58 EDT Ventricular Rate:  61 PR Interval:  188 QRS Duration: 97 QT Interval:  425 QTC Calculation: 429 R Axis:   89 Text Interpretation: Sinus rhythm Atrial premature complexes in couplets Borderline right axis deviation No significant change since last tracing Confirmed by Calvert Cantor [phone]) on 04/12/2022 1:42:45 AM  Procedures Procedures  Medications Ordered in the ED Medications  albuterol (VENTOLIN HFA) 108 (90 Base) MCG/ACT inhaler 2 puff (has no administration in time range)  ipratropium-albuterol (DUONEB) 0.5-2.5 (3) MG/3ML nebulizer solution 3 mL (has no administration in time range)  aerochamber plus with mask device 1 each (has no administration in time range)  predniSONE (DELTASONE) tablet 60 mg (has no administration in time range)  albuterol (VENTOLIN HFA) 108 (90 Base) MCG/ACT inhaler (has no administration in time range)  ipratropium-albuterol (DUONEB) 0.5-2.5 (3) MG/3ML nebulizer solution (has no administration in time range)  ipratropium-albuterol (DUONEB) 0.5-2.5 (3) MG/3ML nebulizer solution 3 mL (3 mLs Nebulization Given 04/11/22 2214)    Initial Impression and Plan  Patient here with CP and SOB, recent Covid diagnosis. CTA chest done as an outpatient in the morning prior to arrival was neg for PE or infiltrates. She reports significant improvement after neb and is requesting a second one prior to discharge. Other labs showed unremarkable CBC, BMP and Trop x 2. Plan short course of steroids, MDI with spacer at home and outpatient follow up. She is seeing her pulmonologist next week.   ED Course       MDM Rules/Calculators/A&P Medical Decision Making Given presenting complaint, I considered that admission might be necessary. After review of results from ED lab and/or imaging studies, admission  to the hospital is not indicated at this time.    Problems Addressed: Bronchitis: acute illness or injury Chest pain, unspecified type: acute illness or injury  Amount and/or Complexity of Data Reviewed External Data Reviewed: radiology. Labs: ordered. Decision-making details documented in ED Course.  Risk Prescription drug management. Decision regarding hospitalization.    Final Clinical Impression(s) / ED Diagnoses Final diagnoses:  Chest pain, unspecified type  Bronchitis    Rx / DC Orders ED Discharge Orders          Ordered    predniSONE (STERAPRED UNI-PAK 21 TAB) 10 MG (21) TBPK tablet        04/12/22 0200             Truddie Hidden, MD 04/12/22 0200

## 2022-04-14 NOTE — Progress Notes (Unsigned)
Synopsis: Referred for dyspnea by Lavone Orn, MD  Subjective:   PATIENT ID: Megan [name] GENDER: female DOB: Dec 20, 1955, MRN: [id number]  No chief complaint on file.  66yF with history of AR, asthma, hypothyroid, atrial septal aneurysm extending into RVOT, MVP, pneumothorax, idiopathic neutropenia, never smoker referred for dyspnea.  In 2009 she had a pneumonia and atelectasis and required bronchoscopy.  She says that she was just started on evenity. Before this she had some work done in her crawlspace and with some brown dust it was cleaned, fogged. Smelled something relatead to this for a while. She had pleuritic CP after first dose which resolved after first day. In ED she had CXR which said interval diffuse interstitial opacities. She did feels some DOE when walking more than a healthy peer.   She is very sensitive to smells, perfumes, etc. She'll feel dyspneic.   Otherwise pertinent review of systems is negative.  She worked in Char[employer] fundraiser, now does Museum/gallery conservator. No frequent exposure to dusts/solvents without a mask other than episode described above.  Interval HPI: Seen in ED 8/19 for chest tightness, dyspnea, cough without fever. CTA Chest in ED was stable, no PE. Given nebs, prednisone taper.  Past Medical History:  Diagnosis Date   Allergic rhinitis    Allergy    Aneurysm of cardiac wall, congenital    a.  septal aneurysm extending into the RVOT (by cardiac MRI in 08/2013)   Anxiety    Aortic atherosclerosis (HCC)    Arthritis    Asthma    Basal cell carcinoma    Depression    Heart murmur    Hypothyroidism    on meds   Leukopenia    idiopathic   Lyme disease    Mitral valve prolapse    MVP (mitral valve prolapse)    Orthostatic hypotension    Osteoporosis    Pancreatic cyst    Peripheral neuropathy 06/09/2012   Pernicious anemia    Pernicious anemia    Pernicious anemia    Plantar fasciitis    Pneumothorax      Family History  Problem  Relation Age of Onset   Hyperlipidemia Father    Heart failure Father    Prostate cancer Father 63   Kidney failure Father    Hypertension Father    Colon polyps Father    Angina Mother    Hypertension Mother    Lung cancer Mother 25       again at 5/79-smoker   Colon cancer Mother 15   Colon polyps Mother    Lung cancer Paternal Grandfather    Breast cancer Paternal Grandmother    Diabetes Paternal Grandmother    Esophageal cancer Neg Hx    Liver cancer Neg Hx    Pancreatic cancer Neg Hx    Rectal cancer Neg Hx    Stomach cancer Neg Hx      Past Surgical History:  Procedure Laterality Date   COLONOSCOPY  2019   JMP-   COLONOSCOPY WITH PROPOFOL N/A 02/22/2013   Procedure: COLONOSCOPY WITH PROPOFOL;  Surgeon: Garlan Fair, MD;  Location: [facility];  Service: Endoscopy;  Laterality: N/A;   MOUTH SURGERY  01/2012   bone graft in mouth   NASAL SINUS SURGERY Right 06/12/2014   Procedure: RIGHT ENDOSCOPIC MAXILLARY ANTROSTOMY;  Surgeon: Ascencion Dike, MD;  Location: [facility];  Service: ENT;  Laterality: Right;   ROOT CANAL  02/27/2012   TONSILLECTOMY  1962  WISDOM TOOTH EXTRACTION      Social History   Socioeconomic History   Marital status: Divorced    Spouse name: Not on file   Number of children: 0   Years of education: Not on file   Highest education level: Not on file  Occupational History    Comment: Career and life coach  Tobacco Use   Smoking status: Never   Smokeless tobacco: Never  Vaping Use   Vaping Use: Never used  Substance and Sexual Activity   Alcohol use: No    Alcohol/week: 0.0 standard drinks of alcohol   Drug use: No   Sexual activity: Not Currently  Other Topics Concern   Not on file  Social History Narrative   Patient is single and lives alone.   Patient is self-employed, career and life coaching.   Patient drinks two to four cups of caffeine daily.   Right handed    Social Determinants of Health   Financial  Resource Strain: Not on file  Food Insecurity: Not on file  Transportation Needs: Not on file  Physical Activity: Not on file  Stress: Not on file  Social Connections: Not on file  Intimate Partner Violence: Not on file     Allergies  Allergen Reactions   Augmentin [Amoxicillin-Pot Clavulanate] Diarrhea   Clindamycin/Lincomycin Diarrhea   Iron     Other reaction(s): severe constipation   Moxifloxacin Other (See Comments)    neuropathy Other reaction(s): neuropathy     Outpatient Medications Prior to Visit  Medication Sig Dispense Refill   5-Hydroxytryptophan (5-HTP) 100 MG CAPS Take 100 mg by mouth daily.      albuterol (VENTOLIN HFA) 108 (90 Base) MCG/ACT inhaler Inhale 1 puff into the lungs daily as needed for wheezing or shortness of breath.      ARMOUR THYROID 30 MG tablet Take 1 tablet (30 mg total) by mouth daily. 90 tablet 3   Ascorbic Acid (VITAMIN C) POWD Take 2,500 mg by mouth daily.     Cholecalciferol (VITAMIN D3) 10000 units capsule Take 10,000 Units by mouth daily.      Cyanocobalamin (VITAMIN B-12 ER PO) Take 5,000 mcg by mouth daily.      estradiol (ESTRACE) 0.1 MG/GM vaginal cream Place 1 Applicatorful vaginally 3 (three) times a week.     ipratropium-albuterol (DUONEB) 0.5-2.5 (3) MG/3ML SOLN Take 3 mLs by nebulization every 6 (six) hours as needed. 75 mL 0   LORazepam (ATIVAN) 0.5 MG tablet Take 0.25-0.5 mg by mouth daily as needed.     MAGNESIUM CITRATE PO Take 600 mg by mouth daily.     Menaquinone-7 (VITAMIN K2 PO) Take 1 tablet by mouth daily.     NON FORMULARY Take 1,000 mcg by mouth daily. Methyl Folate chewable tablet     NONFORMULARY OR COMPOUNDED ITEM Apply 2 drops topically in the morning. Estradiol-Progesterone 0.06-5-0.33 MG liquid drops     predniSONE (STERAPRED UNI-PAK 21 TAB) 10 MG (21) TBPK tablet '10mg'$  Tabs, 6 day taper. Use as directed 1 each 0   progesterone (PROMETRIUM) 100 MG capsule Take 0.5 capsules by mouth daily.     Romosozumab-aqqg  (EVENITY) 105 MG/1.17ML SOSY injection      No facility-administered medications prior to visit.       Objective:   Physical Exam:  General appearance: 66 y.o., female, NAD, conversant  Eyes: anicteric sclerae, moist conjunctivae; no lid-lag; PERRL, tracking appropriately HENT: NCAT; oropharynx, MMM, no mucosal ulcerations; normal hard and soft palate Neck: Trachea midline;  no lymphadenopathy, no JVD Lungs: faint crackles listening anteriorly, no wheeze, with normal respiratory effort CV: RRR, no MRGs  Abdomen: Soft, non-tender; non-distended, BS present  Extremities: No peripheral edema, radial and DP pulses present bilaterally  Skin: Normal temperature, turgor and texture; no rash Psych: Appropriate affect Neuro: Alert and oriented to person and place, no focal deficit    There were no vitals filed for this visit.    on RA BMI Readings from Last 3 Encounters:  04/11/22 19.49 kg/m  04/09/22 19.61 kg/m  02/04/22 19.36 kg/m   Wt Readings from Last 3 Encounters:  04/11/22 132 lb (59.9 kg)  04/09/22 132 lb 12.8 oz (60.2 kg)  02/04/22 131 lb 2 oz (59.5 kg)     CBC    Component Value Date/Time   WBC 3.8 (L) 04/11/2022 2029   RBC 4.35 04/11/2022 2029   HGB 13.3 04/11/2022 2029   HGB 13.2 07/01/2021 1410   HGB 13.7 03/15/2013 1144   HCT 40.4 04/11/2022 2029   HCT 40.8 03/15/2013 1144   PLT 189 04/11/2022 2029   PLT 174 07/01/2021 1410   PLT 210 03/15/2013 1144   MCV 92.9 04/11/2022 2029   MCV 88.9 03/15/2013 1144   MCH 30.6 04/11/2022 2029   MCHC 32.9 04/11/2022 2029   RDW 12.7 04/11/2022 2029   RDW 13.8 03/15/2013 1144   LYMPHSABS 0.7 07/29/2021 0954   LYMPHSABS 1.0 03/15/2013 1144   MONOABS 0.2 07/29/2021 0954   MONOABS 0.3 03/15/2013 1144   EOSABS 0.0 07/29/2021 0954   EOSABS 0.0 03/15/2013 1144   BASOSABS 0.0 07/29/2021 0954   BASOSABS 0.1 03/15/2013 1144    No remarkable eosinophilia  Chest Imaging: HRCT Chest 04/2021 reviewed by me and  remarkable for subtle centrilobular nodules, TIB, definite mosaicism on expiratory imaging  CTA Chest 04/11/22 reviewed by me - stable  Pulmonary Functions Testing Results:     No data to display            Echocardiogram:   12/2016: - Normal LV systolic and diastolic function; 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 Symptomatically it seems like she's doing pretty darn well now and she overall has a preference for care that is holistic and minimizes new medications.  Plan: - PFTs - hypersensitivity pneumonitis panel to identify potential triggers - think about bronchoscopy - a high BAL lymphocyte count might tip our hat to hypersensitivity pneumonitis and drilling down more to make sure we've taken care of any potential mold exposures.    I spent *** minutes dedicated to the care of this patient on the date of this encounter to include pre-visit review of records, face-to-face time with the patient discussing conditions above, post visit ordering of testing, clinical documentation with the electronic health record, making appropriate referrals as documented, and communicating necessary findings to members of the patients care team.       Maryjane Hurter, MD [facility] 04/14/2022 11:38 AM

## 2022-04-15 ENCOUNTER — Encounter: Payer: Self-pay | Admitting: Hematology

## 2022-04-15 ENCOUNTER — Other Ambulatory Visit (HOSPITAL_COMMUNITY): Payer: Self-pay

## 2022-04-15 ENCOUNTER — Encounter: Payer: Self-pay | Admitting: Student

## 2022-04-15 ENCOUNTER — Ambulatory Visit (INDEPENDENT_AMBULATORY_CARE_PROVIDER_SITE_OTHER): Payer: Medicare Other | Admitting: Student

## 2022-04-15 ENCOUNTER — Telehealth: Payer: Self-pay | Admitting: Student

## 2022-04-15 VITALS — BP 98/62 | HR 65 | Temp 97.6°F | Ht 69.0 in | Wt 132.0 lb

## 2022-04-15 DIAGNOSIS — R0600 Dyspnea, unspecified: Secondary | ICD-10-CM | POA: Diagnosis not present

## 2022-04-15 DIAGNOSIS — U071 COVID-19: Secondary | ICD-10-CM | POA: Diagnosis not present

## 2022-04-15 LAB — HEPATIC FUNCTION PANEL
ALT: 14 U/L (ref 0–35)
AST: 21 U/L (ref 0–37)
Albumin: 4.6 g/dL (ref 3.5–5.2)
Alkaline Phosphatase: 53 U/L (ref 39–117)
Bilirubin, Direct: 0.1 mg/dL (ref 0.0–0.3)
Total Bilirubin: 0.5 mg/dL (ref 0.2–1.2)
Total Protein: 6.8 g/dL (ref 6.0–8.3)

## 2022-04-15 NOTE — Patient Instructions (Signed)
-   labs today - 2 more days of prednisone 10 mg daily - let us know if you'd like [place] to fill prescription for LABA/ICS inhaler to try controlling asthma if still feeling unwell - PFTs (breathing tests) in 6 weeks on same day as next appointment provided you stay off of LABA/ICS inhaler for 4 weeks before PFTs. If you end up starting Laba/ICS inhaler then cancel PFTs and postpone clinic appointment with me until 3 months.

## 2022-04-15 NOTE — Telephone Encounter (Signed)
What is least expensive LABA/ICS for her?  Thanks!  

## 2022-04-18 ENCOUNTER — Other Ambulatory Visit: Payer: Self-pay

## 2022-04-18 ENCOUNTER — Encounter: Payer: Self-pay | Admitting: Cardiology

## 2022-04-18 DIAGNOSIS — D510 Vitamin B12 deficiency anemia due to intrinsic factor deficiency: Secondary | ICD-10-CM

## 2022-04-21 ENCOUNTER — Other Ambulatory Visit: Payer: Self-pay

## 2022-04-21 ENCOUNTER — Encounter: Payer: Self-pay | Admitting: Hematology

## 2022-04-21 ENCOUNTER — Inpatient Hospital Stay (HOSPITAL_BASED_OUTPATIENT_CLINIC_OR_DEPARTMENT_OTHER): Payer: Medicare Other | Admitting: Hematology

## 2022-04-21 ENCOUNTER — Inpatient Hospital Stay: Payer: Medicare Other | Attending: Hematology

## 2022-04-21 VITALS — BP 109/47 | HR 69 | Wt 131.9 lb

## 2022-04-21 DIAGNOSIS — D510 Vitamin B12 deficiency anemia due to intrinsic factor deficiency: Secondary | ICD-10-CM

## 2022-04-21 DIAGNOSIS — Z800 Family history of malignant neoplasm of digestive organs: Secondary | ICD-10-CM | POA: Diagnosis not present

## 2022-04-21 DIAGNOSIS — Z803 Family history of malignant neoplasm of breast: Secondary | ICD-10-CM | POA: Insufficient documentation

## 2022-04-21 DIAGNOSIS — Z801 Family history of malignant neoplasm of trachea, bronchus and lung: Secondary | ICD-10-CM | POA: Insufficient documentation

## 2022-04-21 DIAGNOSIS — D72819 Decreased white blood cell count, unspecified: Secondary | ICD-10-CM | POA: Insufficient documentation

## 2022-04-21 DIAGNOSIS — M810 Age-related osteoporosis without current pathological fracture: Secondary | ICD-10-CM | POA: Diagnosis not present

## 2022-04-21 LAB — CBC WITH DIFFERENTIAL (CANCER CENTER ONLY)
Abs Immature Granulocytes: 0.01 10*3/uL (ref 0.00–0.07)
Basophils Absolute: 0 10*3/uL (ref 0.0–0.1)
Basophils Relative: 1 %
Eosinophils Absolute: 0.1 10*3/uL (ref 0.0–0.5)
Eosinophils Relative: 2 %
HCT: 41.9 % (ref 36.0–46.0)
Hemoglobin: 14.1 g/dL (ref 12.0–15.0)
Immature Granulocytes: 0 %
Lymphocytes Relative: 28 %
Lymphs Abs: 1 10*3/uL (ref 0.7–4.0)
MCH: 30.9 pg (ref 26.0–34.0)
MCHC: 33.7 g/dL (ref 30.0–36.0)
MCV: 91.9 fL (ref 80.0–100.0)
Monocytes Absolute: 0.3 10*3/uL (ref 0.1–1.0)
Monocytes Relative: 8 %
Neutro Abs: 2.1 10*3/uL (ref 1.7–7.7)
Neutrophils Relative %: 61 %
Platelet Count: 176 10*3/uL (ref 150–400)
RBC: 4.56 MIL/uL (ref 3.87–5.11)
RDW: 12.6 % (ref 11.5–15.5)
WBC Count: 3.6 10*3/uL — ABNORMAL LOW (ref 4.0–10.5)
nRBC: 0 % (ref 0.0–0.2)

## 2022-04-21 LAB — CMP (CANCER CENTER ONLY)
ALT: 9 U/L (ref 0–44)
AST: 20 U/L (ref 15–41)
Albumin: 4.5 g/dL (ref 3.5–5.0)
Alkaline Phosphatase: 53 U/L (ref 38–126)
Anion gap: 3 — ABNORMAL LOW (ref 5–15)
BUN: 11 mg/dL (ref 8–23)
CO2: 27 mmol/L (ref 22–32)
Calcium: 9.5 mg/dL (ref 8.9–10.3)
Chloride: 108 mmol/L (ref 98–111)
Creatinine: 0.61 mg/dL (ref 0.44–1.00)
GFR, Estimated: >60 mL/min (ref >60)
Glucose, Bld: 90 mg/dL (ref 70–99)
Potassium: 4.3 mmol/L (ref 3.5–5.1)
Sodium: 138 mmol/L (ref 135–145)
Total Bilirubin: 0.6 mg/dL (ref 0.3–1.2)
Total Protein: 6.6 g/dL (ref 6.5–8.1)

## 2022-04-21 LAB — IRON AND IRON BINDING CAPACITY (CC-WL,HP ONLY)
Iron: 117 ug/dL (ref 28–170)
Saturation Ratios: 32 % — ABNORMAL HIGH (ref 10.4–31.8)
TIBC: 363 ug/dL (ref 250–450)
UIBC: 246 ug/dL (ref 148–442)

## 2022-04-21 LAB — VITAMIN B12: Vitamin B-12: 636 pg/mL (ref 180–914)

## 2022-04-21 LAB — FERRITIN: Ferritin: 33 ng/mL (ref 11–307)

## 2022-04-21 LAB — CEA (IN HOUSE-CHCC): CEA (CHCC-In House): 1.71 ng/mL (ref 0.00–5.00)

## 2022-04-22 ENCOUNTER — Ambulatory Visit (INDEPENDENT_AMBULATORY_CARE_PROVIDER_SITE_OTHER): Payer: Medicare Other | Admitting: Podiatry

## 2022-04-22 ENCOUNTER — Encounter: Payer: Self-pay | Admitting: Internal Medicine

## 2022-04-22 DIAGNOSIS — B351 Tinea unguium: Secondary | ICD-10-CM | POA: Diagnosis not present

## 2022-04-22 DIAGNOSIS — M79675 Pain in left toe(s): Secondary | ICD-10-CM | POA: Diagnosis not present

## 2022-04-22 DIAGNOSIS — M79674 Pain in right toe(s): Secondary | ICD-10-CM

## 2022-04-23 ENCOUNTER — Encounter: Payer: Self-pay | Admitting: Hematology

## 2022-04-23 NOTE — Progress Notes (Signed)
Subjective: Chief Complaint  Patient presents with   Nail Problem    Toenails long, thick, difficult to trim and painful. Request toenail trim     66 year old female presents for above complaints.  She has the nails are thickened and causing discomfort.  She states that after last appointment he felt so much better.  No swelling redness or drainage.  No open lesions.  She follows with Dr. Doran Durand for AVN on the right 2nd metatarsal.   Objective: AAO x3, NAD DP/PT pulses palpable bilaterally, CRT less than 3 seconds Nails are hypertrophic, dystrophic, brittle, discolored, elongated 10. No surrounding redness or drainage. Tenderness nails 1-5 bilaterally.  Mild incurvation of the nail borders of the right hallux toenail.  No open lesions or pre-ulcerative lesions are identified today. No pain with calf compression, swelling, warmth, erythema  Assessment: Ingrown toenail, symptomatic onychomycosis  Plan: -All treatment options discussed with the patient including all alternatives, risks, complications.  -Sharply debrided the nails x10 without any complications or bleeding.  In particular try debride the ingrown toenails with any complications of bleeding. -Daily foot inspection  Trula Slade DPM

## 2022-04-28 ENCOUNTER — Encounter: Payer: Self-pay | Admitting: Hematology

## 2022-04-28 NOTE — Progress Notes (Signed)
HEMATOLOGY/ONCOLOGY CLINIC NOTE  Date of Service: 04/21/2022   Patient Care Team: Lavone Orn, MD as PCP - General (Internal Medicine) Stanford Breed Denice Bors, MD as PCP - Cardiology (Cardiology) Annia Belt, MD as Consulting Physician (Oncology) Alda Berthold, DO as Consulting Physician (Neurology) Arnetha Gula, MD as Consulting Physician (Family Medicine) Gerda Diss, DO as Consulting Physician (Sports Medicine) Brunetta Genera, MD as Consulting Physician (Hematology) Alda Berthold, DO as Consulting Physician (Neurology)  CHIEF COMPLAINTS/PURPOSE OF CONSULTATION:  Follow-up for chronic idiopathic neutropenia and pernicious anemia  HISTORY OF PRESENTING ILLNESS:   Megan [name] is a wonderful 66 y.o. female who has been referred to [place] by Dr. Murriel Hopper for evaluation and management of Chronic idiopathic neutropenia. The pt reports that she is doing well overall.   The pt reports a history of mold exposure, chronic mild neutropenia, and neuropathy. She denies frequent or abnormal infections.   The pt notes that she was first diagnosed with neuropathy in 2010, and has seen five neurologists. She endorses weakness and tingling in feet into mid-calf which has improved over the past 10 years. She has current weakness and concern for balance which limits her activity. She adds that the bottom of her feet occasionally become dark purple. She has had doppler studies and notes these ruled out vascular problems. She has seen holistic health practitioners in [place] in the past. She had a four out of five positives on western blot which revealed concerns for Lyme disease but apparently was not conclusive. She previously lived in [place]. She denies ever having a bulls eye rash.  The pt notes some difficulty with writing, which has not been progressive.   The pt has recently been worked up for elevated estrogen levels and a previous cyst, and was  previously taking Biotin. She stopped taking Boitin and has seen normalized estrogen levels. She notes that repeat MRI revealed the absence of the previously seen cyst  The pt also reports a history of polyclonal gammopathy.   She notes that her Ferritin was seen to be at 26 about 1.5 years ago. She has been a vegan and then began eating some meat protein. She also took PO iron replacement which was constipating. She also has taken Blood builder. Her Ferritin has recently decreased to a 10. Pt denies blood in the stools nor black stools. She had a colonoscopy about 1.5 years ago and notes this was unremarkable. She denies unexpected weight loss. She denies using acid suppressants. She endorses having pernicious anemia. Pt takes 503mg Vitamin B12 daily. She is also taking 10k units Vitamin D3 five days a week.   The pt notes that she has osteoporosis, with L1-L4 with score of -3.5. She has been disinclined to pursue Prolia injections. She has been consuming almond milk and avoids dairy products. She took Fosamax three years ago.  Most recent lab results (08/22/18) of CBC is as follows: all values are WNL except for WBC at 3.5k.  On review of systems, pt reports neuropathy with weakness in feet, stable energy levels, constipation, and denies blood in the stools, black stools, abdominal pains, and any other symptoms.   On PMHx the pt reports neuropathy, pernicious anemia, osteoporosis.  INTERVAL HISTORY:   KTYANNAH SANEis here for continued evaluation and management of her pernicious anemia and chronic idiopathic neutropenia . Patient notes no acute new symptoms other than some increase in hair loss.  Wondering if she needs additional  IV iron replacement. No new issues with infections. Labs done today were reviewed in detail with her    MEDICAL HISTORY:  Past Medical History:  Diagnosis Date   Allergic rhinitis    Allergy    Aneurysm of cardiac wall, congenital    a.  septal aneurysm  extending into the RVOT (by cardiac MRI in 08/2013)   Anxiety    Aortic atherosclerosis (HCC)    Arthritis    Asthma    Basal cell carcinoma    Depression    Heart murmur    Hypothyroidism    on meds   Leukopenia    idiopathic   Lyme disease    Mitral valve prolapse    MVP (mitral valve prolapse)    Orthostatic hypotension    Osteoporosis    Pancreatic cyst    Peripheral neuropathy 06/09/2012   Pernicious anemia    Pernicious anemia    Pernicious anemia    Plantar fasciitis    Pneumothorax     SURGICAL HISTORY: Past Surgical History:  Procedure Laterality Date   COLONOSCOPY  2019   JMP-   COLONOSCOPY WITH PROPOFOL N/A 02/22/2013   Procedure: COLONOSCOPY WITH PROPOFOL;  Surgeon: Garlan Fair, MD;  Location: [facility];  Service: Endoscopy;  Laterality: N/A;   MOUTH SURGERY  01/2012   bone graft in mouth   NASAL SINUS SURGERY Right 06/12/2014   Procedure: RIGHT ENDOSCOPIC MAXILLARY ANTROSTOMY;  Surgeon: Ascencion Dike, MD;  Location: [place];  Service: ENT;  Laterality: Right;   ROOT CANAL  02/27/2012   TONSILLECTOMY  1962   WISDOM TOOTH EXTRACTION      SOCIAL HISTORY: Social History   Socioeconomic History   Marital status: Divorced    Spouse name: Not on file   Number of children: 0   Years of education: Not on file   Highest education level: Not on file  Occupational History    Comment: Career and life coach  Tobacco Use   Smoking status: Never   Smokeless tobacco: Never  Vaping Use   Vaping Use: Never used  Substance and Sexual Activity   Alcohol use: No    Alcohol/week: 0.0 standard drinks of alcohol   Drug use: No   Sexual activity: Not Currently  Other Topics Concern   Not on file  Social History Narrative   Patient is single and lives alone.   Patient is self-employed, career and life coaching.   Patient drinks two to four cups of caffeine daily.   Right handed    Social Determinants of Health   Financial Resource  Strain: Not on file  Food Insecurity: Not on file  Transportation Needs: Not on file  Physical Activity: Not on file  Stress: Not on file  Social Connections: Not on file  Intimate Partner Violence: Not on file    FAMILY HISTORY: Family History  Problem Relation Age of Onset   Hyperlipidemia Father    Heart failure Father    Prostate cancer Father 80   Kidney failure Father    Hypertension Father    Colon polyps Father    Angina Mother    Hypertension Mother    Lung cancer Mother 65       again at 85/79-smoker   Colon cancer Mother 90   Colon polyps Mother    Lung cancer Paternal Grandfather    Breast cancer Paternal Grandmother    Diabetes Paternal Grandmother    Esophageal cancer Neg Hx  Liver cancer Neg Hx    Pancreatic cancer Neg Hx    Rectal cancer Neg Hx    Stomach cancer Neg Hx     ALLERGIES:  is allergic to augmentin [amoxicillin-pot clavulanate], clindamycin/lincomycin, iron, and moxifloxacin.  MEDICATIONS:  Current Outpatient Medications  Medication Sig Dispense Refill   5-Hydroxytryptophan (5-HTP) 100 MG CAPS Take 100 mg by mouth daily.      albuterol (VENTOLIN HFA) 108 (90 Base) MCG/ACT inhaler Inhale 1 puff into the lungs daily as needed for wheezing or shortness of breath.      ARMOUR THYROID 30 MG tablet Take 1 tablet (30 mg total) by mouth daily. 90 tablet 3   Ascorbic Acid (VITAMIN C) POWD Take 2,500 mg by mouth daily.     Cholecalciferol (VITAMIN D3) 10000 units capsule Take 10,000 Units by mouth daily.      Cyanocobalamin (VITAMIN B-12 ER PO) Take 5,000 mcg by mouth daily.      estradiol (ESTRACE) 0.1 MG/GM vaginal cream Place 1 Applicatorful vaginally 3 (three) times a week.     ipratropium-albuterol (DUONEB) 0.5-2.5 (3) MG/3ML SOLN Take 3 mLs by nebulization every 6 (six) hours as needed. 75 mL 0   IVERMECTIN PO Take 1 capsule by mouth daily.     LORazepam (ATIVAN) 0.5 MG tablet Take 0.25-0.5 mg by mouth daily as needed.     MAGNESIUM CITRATE PO  Take 600 mg by mouth daily.     Menaquinone-7 (VITAMIN K2 PO) Take 1 tablet by mouth daily.     NON FORMULARY Take 1,000 mcg by mouth daily. Methyl Folate chewable tablet     NONFORMULARY OR COMPOUNDED ITEM Apply 2 drops topically in the morning. Estradiol-Progesterone 0.06-5-0.33 MG liquid drops     predniSONE (STERAPRED UNI-PAK 21 TAB) 10 MG (21) TBPK tablet '10mg'$  Tabs, 6 day taper. Use as directed 1 each 0   progesterone (PROMETRIUM) 100 MG capsule Take 0.5 capsules by mouth daily.     Romosozumab-aqqg (EVENITY) 105 MG/1.17ML SOSY injection      No current facility-administered medications for this visit.    REVIEW OF SYSTEMS:   10 Point review of Systems was done is negative except as noted above.  PHYSICAL EXAMINATION:  . Vitals:   04/21/22 0936  BP: (!) 109/47  Pulse: 69   Filed Weights   04/21/22 0936  Weight: 131 lb 14.4 oz (59.8 kg)   .Body mass index is 19.48 kg/m. NAD GENERAL:alert, in no acute distress and comfortable SKIN: no acute rashes, no significant lesions EYES: conjunctiva are pink and non-injected, sclera anicteric OROPHARYNX: MMM, no exudates, no oropharyngeal erythema or ulceration NECK: supple, no JVD LYMPH:  no palpable lymphadenopathy in the cervical, axillary or inguinal regions LUNGS: clear to auscultation b/l with normal respiratory effort HEART: regular rate & rhythm ABDOMEN:  normoactive bowel sounds , non tender, not distended. Extremity: no pedal edema PSYCH: alert & oriented x 3 with fluent speech NEURO: no focal motor/sensory deficits   LABORATORY DATA:  I have reviewed the data as listed  .    Latest Ref Rng & Units 04/21/2022    9:21 AM 04/11/2022    8:29 PM 07/29/2021    9:54 AM  CBC  WBC 4.0 - 10.5 K/uL 3.6  3.8  2.4 Repeated and verified X2.   Hemoglobin 12.0 - 15.0 g/dL 14.1  13.3  13.9   Hematocrit 36.0 - 46.0 % 41.9  40.4  41.2   Platelets 150 - 400 K/uL 176  189  162.0     .  Latest Ref Rng & Units 04/21/2022     9:21 AM 04/15/2022    9:49 AM 04/11/2022    8:29 PM  CMP  Glucose 70 - 99 mg/dL 90   102   BUN 8 - 23 mg/dL 11   9   Creatinine 0.44 - 1.00 mg/dL 0.61   1.05   Sodium 135 - 145 mmol/L 138   140   Potassium 3.5 - 5.1 mmol/L 4.3   3.5   Chloride 98 - 111 mmol/L 108   102   CO2 22 - 32 mmol/L 27   29   Calcium 8.9 - 10.3 mg/dL 9.5   9.7   Total Protein 6.5 - 8.1 g/dL 6.6  6.8    Total Bilirubin 0.3 - 1.2 mg/dL 0.6  0.5    Alkaline Phos 38 - 126 U/L 53  53    AST 15 - 41 U/L 20  21    ALT 0 - 44 U/L 9  14     . Lab Results  Component Value Date   IRON 117 04/21/2022   TIBC 363 04/21/2022   IRONPCTSAT 32 (H) 04/21/2022   (Iron and TIBC)  Lab Results  Component Value Date   FERRITIN 33 04/21/2022     RADIOGRAPHIC STUDIES: I have personally reviewed the radiological images as listed and agreed with the findings in the report.  ASSESSMENT & PLAN:   66 y.o. female with  1. Chronic idiopathic leucopenia. WBC count today low normal @ 2.8k ANC 1800 ( no neutropenia) 2. Pernicious anemia -12/30/17 Antiparietal cell antibodies which were high at 105.9  PLAN: -Patient's labs done today were reviewed in detail CBC shows WBC count of 3.6 with an ANC of 2.1k normal platelets of 176k and hemoglobin of 14.1 CMP stable Ferritin down from 98 to 33.  With an iron saturation of 32%. Patient is having issues with hair loss and with her pernicious anemia the goal is to try to keep her ferritin close to 100. -We shall offer her the option of additional IV iron replacement.-1 dose of Injectafer 750 mg with premedications. -Continue sublingual B12--level stable at 636 Continue empiric vitamin B complex FOLLOW UP: RTC with Dr Irene Limbo with labs in 12 months  The total time spent in the appointment was 20 minutes*.  All of the patient's questions were answered with apparent satisfaction. The patient knows to call the clinic with any problems, questions or concerns.   Sullivan Lone MD MS AAHIVMS  [facility] Hematology/Oncology Physician [facility]  .*Total Encounter Time as defined by the Centers for Medicare and Medicaid Services includes, in addition to the face-to-face time of a patient visit (documented in the note above) non-face-to-face time: obtaining and reviewing outside history, ordering and reviewing medications, tests or procedures, care coordination (communications with other health care professionals or caregivers) and documentation in the medical record.

## 2022-04-29 ENCOUNTER — Encounter: Payer: Self-pay | Admitting: Student

## 2022-04-29 NOTE — Progress Notes (Signed)
HPI: FU hyperlipidemia and dyspnea.  MRA January 2015 showed aneurysmal dilatation of the membranous ventricular septum extending into the right ventricular outflow tract. There was no sinus of Valsalva aneurysm.  Ejection fraction 57%. Carotid Dopplers August 2016 normal.  Echocardiogram May 2018 showed normal LV systolic function and mild mitral regurgitation.  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 Stress echocardiogram Feb 2019 was normal. CTA June 2019 showed aneurysm of the membranous ventricular septum that bulges into the right ventricular outflow tract. There is no connection between the left ventricle and right ventricle. There is no sinus of Valsalva aneurysm. Calcium score 0. No coronary disease. ABIs August 2020 showed noncompressible left lower extremity artery and normal right. Abdominal MRI 5/21 showed stable 8 mm cyst at the junction of the pancreatic body and tail felt likely indolent cystic neoplasm and follow-up recommended 1 year. There was also note of aortic atherosclerosis. Seen with palpitations and monitor June 2021 showed sinus bradycardia, normal sinus rhythm, sinus tachycardia, occasional PAC, PVCs, rare couplet and brief PAT. ABIs February 2022 normal.  Since last seen she was diagnosed with COVID 03/28/22.  At that time she did have increased chest pain.  However she has had occasional pain in her left chest prior to that and since.  The pain can happen both with activities and at rest.  It typically lasts 1 minute and resolve spontaneously without radiation.  She denies dyspnea on exertion, orthopnea, PND, pedal edema or syncope.  Current Outpatient Medications  Medication Sig Dispense Refill   5-Hydroxytryptophan (5-HTP) 100 MG CAPS Take 100 mg by mouth daily.      albuterol (VENTOLIN HFA) 108 (90 Base) MCG/ACT inhaler Inhale 1 puff into the lungs daily as needed for wheezing or shortness of breath.      ARMOUR THYROID 30 MG tablet Take 1  tablet (30 mg total) by mouth daily. 90 tablet 3   Ascorbic Acid (VITAMIN C) POWD Take 2,500 mg by mouth daily.     Cholecalciferol (VITAMIN D3) 10000 units capsule Take 10,000 Units by mouth daily.      Cyanocobalamin (VITAMIN B-12 ER PO) Take 5,000 mcg by mouth daily.      estradiol (ESTRACE) 0.1 MG/GM vaginal cream Place 1 Applicatorful vaginally 3 (three) times a week.     fluticasone-salmeterol (WIXELA INHUB) 100-50 MCG/ACT AEPB Inhale 1 puff into the lungs 2 (two) times daily. 60 each 11   loratadine (CLARITIN) 10 MG tablet Take 10 mg by mouth daily.     LORazepam (ATIVAN) 0.5 MG tablet Take 0.25-0.5 mg by mouth daily as needed.     MAGNESIUM CITRATE PO Take 600 mg by mouth daily.     Menaquinone-7 (VITAMIN K2 PO) Take 1 tablet by mouth daily.     NON FORMULARY Take 1,000 mcg by mouth daily. Methyl Folate chewable tablet     NONFORMULARY OR COMPOUNDED ITEM Apply 2 drops topically in the morning. Estradiol-Progesterone 0.06-5-0.33 MG liquid drops     progesterone (PROMETRIUM) 100 MG capsule Take 0.5 capsules by mouth daily.     Romosozumab-aqqg (EVENITY) 105 MG/1.17ML SOSY injection      No current facility-administered medications for this visit.     Past Medical History:  Diagnosis Date   Allergic rhinitis    Allergy    Aneurysm of cardiac wall, congenital    a.  septal aneurysm extending into the RVOT (by cardiac MRI in 08/2013)   Anxiety    Aortic atherosclerosis (  [place])    Arthritis    Asthma    Basal cell carcinoma    Depression    Heart murmur    Hypothyroidism    on meds   Leukopenia    idiopathic   Lyme disease    Mitral valve prolapse    MVP (mitral valve prolapse)    Orthostatic hypotension    Osteoporosis    Pancreatic cyst    Peripheral neuropathy 06/09/2012   Pernicious anemia    Pernicious anemia    Pernicious anemia    Plantar fasciitis    Pneumothorax     Past Surgical History:  Procedure Laterality Date   COLONOSCOPY  2019   JMP-    COLONOSCOPY WITH PROPOFOL N/A 02/22/2013   Procedure: COLONOSCOPY WITH PROPOFOL;  Surgeon: Garlan Fair, MD;  Location: [facility];  Service: Endoscopy;  Laterality: N/A;   MOUTH SURGERY  01/2012   bone graft in mouth   NASAL SINUS SURGERY Right 06/12/2014   Procedure: RIGHT ENDOSCOPIC MAXILLARY ANTROSTOMY;  Surgeon: Ascencion Dike, MD;  Location: [place];  Service: ENT;  Laterality: Right;   ROOT CANAL  02/27/2012   TONSILLECTOMY  1962   WISDOM TOOTH EXTRACTION      Social History   Socioeconomic History   Marital status: Divorced    Spouse name: Not on file   Number of children: 0   Years of education: Not on file   Highest education level: Not on file  Occupational History    Comment: Career and life coach  Tobacco Use   Smoking status: Never   Smokeless tobacco: Never  Vaping Use   Vaping Use: Never used  Substance and Sexual Activity   Alcohol use: No    Alcohol/week: 0.0 standard drinks of alcohol   Drug use: No   Sexual activity: Not Currently  Other Topics Concern   Not on file  Social History Narrative   Patient is single and lives alone.   Patient is self-employed, career and life coaching.   Patient drinks two to four cups of caffeine daily.   Right handed    Social Determinants of Health   Financial Resource Strain: Not on file  Food Insecurity: Not on file  Transportation Needs: Not on file  Physical Activity: Not on file  Stress: Not on file  Social Connections: Not on file  Intimate Partner Violence: Not on file    Family History  Problem Relation Age of Onset   Hyperlipidemia Father    Heart failure Father    Prostate cancer Father 78   Kidney failure Father    Hypertension Father    Colon polyps Father    Angina Mother    Hypertension Mother    Lung cancer Mother 37       again at 68/79-smoker   Colon cancer Mother 43   Colon polyps Mother    Lung cancer Paternal Grandfather    Breast cancer Paternal Grandmother     Diabetes Paternal Grandmother    Esophageal cancer Neg Hx    Liver cancer Neg Hx    Pancreatic cancer Neg Hx    Rectal cancer Neg Hx    Stomach cancer Neg Hx     ROS: no fevers or chills, productive cough, hemoptysis, dysphasia, odynophagia, melena, hematochezia, dysuria, hematuria, rash, seizure activity, orthopnea, PND, pedal edema, claudication. Remaining systems are negative.  Physical Exam: Well-developed well-nourished in no acute distress.  Skin is warm and dry.  HEENT is normal.  Neck is supple.  Chest is clear to auscultation with normal expansion.  Cardiovascular exam is regular rate and rhythm.  Abdominal exam nontender or distended. No masses palpated. Extremities show no edema. neuro grossly intact  ECG-sinus bradycardia with PACs, normal axis, no ST changes.  Personally reviewed  A/P  1 history of chest pain-previous calcium score 0 with no coronary disease.  Electrocardiogram unchanged.  She had a CT recently to rule out pulmonary embolus and no coronary calcification noted.  Will not pursue further evaluation at this point.  2 history of aneurysmal dilatation of the interventricular septum extending into the right ventricular outflow tract-no VSD or sinus of Valsalva aneurysm identified at time of previous work-up.  3 palpitations-no significant arrhythmias noted on previous monitor.  4 hyperlipidemia-continue diet.  She declined statins previously. Check lipids.  5 history of orthostasis-continue to increase sodium and fluid intake.  Kirk Ruths, MD

## 2022-04-29 NOTE — Telephone Encounter (Signed)
Pharmacy, please advise on pt's email. Pt states wixela isnt covered.Can you see what are the covered alternatives? Thanks.

## 2022-04-30 ENCOUNTER — Ambulatory Visit (INDEPENDENT_AMBULATORY_CARE_PROVIDER_SITE_OTHER): Payer: Medicare Other | Admitting: Pulmonary Disease

## 2022-04-30 ENCOUNTER — Encounter: Payer: Self-pay | Admitting: Pulmonary Disease

## 2022-04-30 ENCOUNTER — Other Ambulatory Visit (HOSPITAL_COMMUNITY): Payer: Self-pay

## 2022-04-30 VITALS — BP 108/66 | HR 69 | Ht 69.0 in | Wt 131.8 lb

## 2022-04-30 DIAGNOSIS — R0789 Other chest pain: Secondary | ICD-10-CM | POA: Diagnosis not present

## 2022-04-30 NOTE — Progress Notes (Signed)
Megan [name]    [id number]    07/17/1956  Primary Care Physician:Griffin, Jenny Reichmann, MD  Referring Physician: Lavone Orn, MD [facility] [employer] [address] [place],  [place] [address]  Chief complaint:   Follow-up for chest tightness  HPI:  Patient had recently seen Dr. Verlee Monte for chest tightness, post-COVID symptoms Felt to have reactive airway dysfunction She also did follow-up with Dr. Corrin Parker at [facility] pulmonary.  She has been diagnosed with COVID around early August, symptoms were self-limiting  She had recurrence of symptoms and tested negative this time around but came down with a headache, sinus congestion, no drainage Some chest tightness  She was recently prescribed Wixela which she wanted to clarify it is safe to take  She has had a recent pulmonary function test which was negative for significant obstructive disease  A recent CT scan also was negative for any infiltrative process  Has had no weight loss Appetite is good Activity level remains good  Outpatient Encounter Medications as of 04/30/2022  Medication Sig   5-Hydroxytryptophan (5-HTP) 100 MG CAPS Take 100 mg by mouth daily.    albuterol (VENTOLIN HFA) 108 (90 Base) MCG/ACT inhaler Inhale 1 puff into the lungs daily as needed for wheezing or shortness of breath.    ARMOUR THYROID 30 MG tablet Take 1 tablet (30 mg total) by mouth daily.   Ascorbic Acid (VITAMIN C) POWD Take 2,500 mg by mouth daily.   Cholecalciferol (VITAMIN D3) 10000 units capsule Take 10,000 Units by mouth daily.    Cyanocobalamin (VITAMIN B-12 ER PO) Take 5,000 mcg by mouth daily.    estradiol (ESTRACE) 0.1 MG/GM vaginal cream Place 1 Applicatorful vaginally 3 (three) times a week.   ipratropium-albuterol (DUONEB) 0.5-2.5 (3) MG/3ML SOLN Take 3 mLs by nebulization every 6 (six) hours as needed.   IVERMECTIN PO Take 1 capsule by mouth daily.   LORazepam (ATIVAN) 0.5 MG tablet Take 0.25-0.5 mg by mouth daily as needed.   MAGNESIUM  CITRATE PO Take 600 mg by mouth daily.   Menaquinone-7 (VITAMIN K2 PO) Take 1 tablet by mouth daily.   NON FORMULARY Take 1,000 mcg by mouth daily. Methyl Folate chewable tablet   NONFORMULARY OR COMPOUNDED ITEM Apply 2 drops topically in the morning. Estradiol-Progesterone 0.06-5-0.33 MG liquid drops   progesterone (PROMETRIUM) 100 MG capsule Take 0.5 capsules by mouth daily.   Romosozumab-aqqg (EVENITY) 105 MG/1.17ML SOSY injection    [DISCONTINUED] predniSONE (STERAPRED UNI-PAK 21 TAB) 10 MG (21) TBPK tablet '10mg'$  Tabs, 6 day taper. Use as directed   No facility-administered encounter medications on file as of 04/30/2022.    Allergies as of 04/30/2022 - Review Complete 04/30/2022  Allergen Reaction Noted   Augmentin [amoxicillin-pot clavulanate] Diarrhea 06/29/2019   Clindamycin/lincomycin Diarrhea 06/29/2019   Iron  09/25/2020   Moxifloxacin Other (See Comments) 11/12/2020    Past Medical History:  Diagnosis Date   Allergic rhinitis    Allergy    Aneurysm of cardiac wall, congenital    a.  septal aneurysm extending into the RVOT (by cardiac MRI in 08/2013)   Anxiety    Aortic atherosclerosis (HCC)    Arthritis    Asthma    Basal cell carcinoma    Depression    Heart murmur    Hypothyroidism    on meds   Leukopenia    idiopathic   Lyme disease    Mitral valve prolapse    MVP (mitral valve prolapse)  Orthostatic hypotension    Osteoporosis    Pancreatic cyst    Peripheral neuropathy 06/09/2012   Pernicious anemia    Pernicious anemia    Pernicious anemia    Plantar fasciitis    Pneumothorax     Past Surgical History:  Procedure Laterality Date   COLONOSCOPY  2019   JMP-   COLONOSCOPY WITH PROPOFOL N/A 02/22/2013   Procedure: COLONOSCOPY WITH PROPOFOL;  Surgeon: Garlan Fair, MD;  Location: [facility];  Service: Endoscopy;  Laterality: N/A;   MOUTH SURGERY  01/2012   bone graft in mouth   NASAL SINUS SURGERY Right 06/12/2014   Procedure: RIGHT ENDOSCOPIC  MAXILLARY ANTROSTOMY;  Surgeon: Ascencion Dike, MD;  Location: [facility];  Service: ENT;  Laterality: Right;   ROOT CANAL  02/27/2012   TONSILLECTOMY  1962   WISDOM TOOTH EXTRACTION      Family History  Problem Relation Age of Onset   Hyperlipidemia Father    Heart failure Father    Prostate cancer Father 73   Kidney failure Father    Hypertension Father    Colon polyps Father    Angina Mother    Hypertension Mother    Lung cancer Mother 57       again at 75/79-smoker   Colon cancer Mother 39   Colon polyps Mother    Lung cancer Paternal Grandfather    Breast cancer Paternal Grandmother    Diabetes Paternal Grandmother    Esophageal cancer Neg Hx    Liver cancer Neg Hx    Pancreatic cancer Neg Hx    Rectal cancer Neg Hx    Stomach cancer Neg Hx     Social History   Socioeconomic History   Marital status: Divorced    Spouse name: Not on file   Number of children: 0   Years of education: Not on file   Highest education level: Not on file  Occupational History    Comment: Designer, jewellery and life coach  Tobacco Use   Smoking status: Never   Smokeless tobacco: Never  Vaping Use   Vaping Use: Never used  Substance and Sexual Activity   Alcohol use: No    Alcohol/week: 0.0 standard drinks of alcohol   Drug use: No   Sexual activity: Not Currently  Other Topics Concern   Not on file  Social History Narrative   Patient is single and lives alone.   Patient is self-employed, career and life coaching.   Patient drinks two to four cups of caffeine daily.   Right handed    Social Determinants of Health   Financial Resource Strain: Not on file  Food Insecurity: Not on file  Transportation Needs: Not on file  Physical Activity: Not on file  Stress: Not on file  Social Connections: Not on file  Intimate Partner Violence: Not on file    Review of Systems  Constitutional:  Negative for fatigue.  Respiratory:  Positive for chest tightness. Negative for cough and  shortness of breath.     Vitals:   04/30/22 0949  BP: 108/66  Pulse: 69  SpO2: 100%     Physical Exam Constitutional:      Appearance: Normal appearance.  HENT:     Head: Normocephalic.     Mouth/Throat:     Mouth: Mucous membranes are moist.  Cardiovascular:     Rate and Rhythm: Normal rate and regular rhythm.     Heart sounds: No murmur heard.    No  friction rub.  Pulmonary:     Effort: No respiratory distress.     Breath sounds: No stridor. No wheezing or rhonchi.  Musculoskeletal:     Cervical back: No rigidity or tenderness.  Neurological:     Mental Status: She is alert.  Psychiatric:        Mood and Affect: Mood normal.    Data Reviewed: PFT performed at [facility] reviewed showing no obstructive defect, no significant bronchodilator response, no restriction  CT scan report 04/11/2022-no infiltrative process  Assessment:  Reactive airway dysfunction  Possibly viral URI  Plan/Recommendations: Use of Wixela may help  Encouraged to rinse afterwards  Use albuterol as needed  Wixela may be used regularly for about 2 to 4 weeks and then try and get off the inhaler  Symptomatic management of headache and stuffiness  Keep your appointment with Dr. Bjorn Loser in October  Call with significant concerns   Sherrilyn Rist MD [facility] 04/30/2022, 10:18 AM  CC: Lavone Orn, MD

## 2022-04-30 NOTE — Patient Instructions (Signed)
Keep appointment with Dr. Jillyn Ledger for October

## 2022-05-01 ENCOUNTER — Telehealth: Payer: Self-pay | Admitting: Student

## 2022-05-01 NOTE — Telephone Encounter (Signed)
Pt seen by Dr. Ander Slade on 9/6. Will close encounter.

## 2022-05-05 ENCOUNTER — Ambulatory Visit: Payer: Medicare Other | Admitting: Student

## 2022-05-05 ENCOUNTER — Other Ambulatory Visit (HOSPITAL_COMMUNITY): Payer: Self-pay

## 2022-05-05 MED ORDER — FLUTICASONE-SALMETEROL 100-50 MCG/ACT IN AEPB
1.0000 | INHALATION_SPRAY | Freq: Two times a day (BID) | RESPIRATORY_TRACT | 11 refills | Status: DC
Start: 2022-05-05 — End: 2022-06-11

## 2022-05-05 NOTE — Telephone Encounter (Signed)
Megan [name], CPhT to Collier Salina, RN      04/30/22  9:08 AM Test billing for this patient's plan returns the following results for ICS/LABA products:   Advair Diskus (DAW 9 ) $434.81  Advair HFA $476.25  Breo $436.38  Dulera $378.16  Symbicort non-formulary   Please note this patient has a $1000 deductible that has not been met. This insurance plan did not provide copay information beyond the deductible pricing.     Called and spoke with pt letting her know the info from prior auth team. Pt stated that she does not have a deductible with her insurance and that the info stating that is incorrect. Pt said she is on Medicare and has a [place] prescription plan which she said nobody has asked her about that and said that it does not seem like it is on file.  Pt said her prescription plan is showing that she has no deductible.  Before sending this to Dr. Verlee Monte, pt is requesting someone from prior auth team to give her a call so she can get things clarified about her [employer] prescription plan.   Routing back to prior auth team.

## 2022-05-05 NOTE — Telephone Encounter (Signed)
Contacted patient at patient request. Discussed pharmacy coverage and test billing results. [employer] Medicare plan on file in [facility] is active and current, and is not subject to the standard $505 deductible as it is through [place] and not a direct medicare coverage plan.  Today results in the same pricing as 09/06 encounter. Patient did confirm that when looking online the insurance portal does mention that this class of inhalers may be subject to higher pricing or not covered at all. Patient is understanding that I am only providing test claim information and she may need to contact the insurance directly to confirm pricing or deductible amounts.  Patient inquired about possible use of Wixela, and has found potential GoodRx savings that would make this option affordable. Confirmed that while the insurance does prefer Advair brand, Wixela should be an equivalent option, if insurance is not being used and if MD finds this agreeable.  Patient requests, if this would be an option for therapy, please send new rx for Wixela 100-50 mcg/act to [facility] on [address] Patient will wait for confirmation call from office, or notification from [facility].  Clista Bernhardt, CPhT [facility] Phone: [phone]

## 2022-05-05 NOTE — Telephone Encounter (Signed)
Wixela 100-50 1 puff twice daily sent to [facility] on [address]. Needs to rinse mouth/gargle after use.

## 2022-05-05 NOTE — Telephone Encounter (Signed)
Dr. Verlee Monte, please advise if you are okay with  Rx for Wixela 100-50 being sent to [facility] on [address] for pt.

## 2022-05-07 ENCOUNTER — Inpatient Hospital Stay: Payer: Medicare Other | Attending: Hematology

## 2022-05-07 VITALS — BP 108/55 | Temp 97.7°F | Resp 71

## 2022-05-07 DIAGNOSIS — D510 Vitamin B12 deficiency anemia due to intrinsic factor deficiency: Secondary | ICD-10-CM | POA: Insufficient documentation

## 2022-05-07 DIAGNOSIS — D508 Other iron deficiency anemias: Secondary | ICD-10-CM

## 2022-05-07 MED ORDER — SODIUM CHLORIDE 0.9 % IV SOLN: Freq: Once | INTRAVENOUS | Status: AC

## 2022-05-07 MED ORDER — LORATADINE 10 MG PO TABS
10.0000 mg | ORAL_TABLET | Freq: Once | ORAL | Status: AC
  Administered 2022-05-07: 10 mg via ORAL
  Filled 2022-05-07: qty 1

## 2022-05-07 MED ORDER — FAMOTIDINE IN NACL 20-0.9 MG/50ML-% IV SOLN
20.0000 mg | Freq: Once | INTRAVENOUS | Status: AC
  Administered 2022-05-07: 20 mg via INTRAVENOUS
  Filled 2022-05-07: qty 50

## 2022-05-07 MED ORDER — ACETAMINOPHEN 325 MG PO TABS
650.0000 mg | ORAL_TABLET | Freq: Once | ORAL | Status: AC
  Administered 2022-05-07: 650 mg via ORAL
  Filled 2022-05-07: qty 2

## 2022-05-07 MED ORDER — SODIUM CHLORIDE 0.9 % IV SOLN
750.0000 mg | Freq: Once | INTRAVENOUS | Status: AC
  Administered 2022-05-07: 750 mg via INTRAVENOUS
  Filled 2022-05-07: qty 15

## 2022-05-07 MED ORDER — METHYLPREDNISOLONE SODIUM SUCC 125 MG IJ SOLR
60.0000 mg | Freq: Once | INTRAMUSCULAR | Status: DC
  Filled 2022-05-07: qty 2

## 2022-05-07 NOTE — Patient Instructions (Signed)

## 2022-05-08 ENCOUNTER — Encounter: Payer: Self-pay | Admitting: Cardiology

## 2022-05-08 ENCOUNTER — Ambulatory Visit: Payer: Medicare Other | Attending: Cardiology | Admitting: Cardiology

## 2022-05-08 VITALS — BP 110/62 | HR 58 | Ht 69.0 in | Wt 131.0 lb

## 2022-05-08 DIAGNOSIS — R072 Precordial pain: Secondary | ICD-10-CM | POA: Insufficient documentation

## 2022-05-08 DIAGNOSIS — I951 Orthostatic hypotension: Secondary | ICD-10-CM

## 2022-05-08 DIAGNOSIS — E7800 Pure hypercholesterolemia, unspecified: Secondary | ICD-10-CM | POA: Insufficient documentation

## 2022-05-08 DIAGNOSIS — I253 Aneurysm of heart: Secondary | ICD-10-CM | POA: Diagnosis not present

## 2022-05-08 DIAGNOSIS — R002 Palpitations: Secondary | ICD-10-CM | POA: Diagnosis present

## 2022-05-08 NOTE — Patient Instructions (Signed)
  Follow-Up: At [facility], you and your health needs are our priority.  As part of our continuing mission to provide you with exceptional heart care, we have created designated Provider Care Teams.  These Care Teams include your primary Cardiologist (physician) and Advanced Practice Providers (APPs -  Physician Assistants and Nurse Practitioners) who all work together to provide you with the care you need, when you need it.  We recommend signing up for the patient portal called "MyChart".  Sign up information is provided on this After Visit Summary.  MyChart is used to connect with patients for Virtual Visits (Telemedicine).  Patients are able to view lab/test results, encounter notes, upcoming appointments, etc.  Non-urgent messages can be sent to your provider as well.   To learn more about what you can do with MyChart, go to [email or web address].    Your next appointment:   12 month(s)  The format for your next appointment:   In Person  Provider:   Brian Crenshaw, MD   

## 2022-05-12 ENCOUNTER — Encounter: Payer: Self-pay | Admitting: Cardiology

## 2022-05-12 DIAGNOSIS — E7800 Pure hypercholesterolemia, unspecified: Secondary | ICD-10-CM

## 2022-06-03 ENCOUNTER — Encounter: Payer: Self-pay | Admitting: Student

## 2022-06-03 NOTE — Telephone Encounter (Signed)
Dr. Verlee Monte, please see pt's message. She tested positive for COVID on 10/8. Her PFT and OV are scheduled for 10/18. Per policy she is to wait at least 21 days after her positive covid test to perform a PFT. PFT has been cancelled. However, pt wishes to still keep her OV with you on 10/18. Please advise if this is ok. Thanks.

## 2022-06-04 ENCOUNTER — Other Ambulatory Visit: Payer: Medicare Other

## 2022-06-04 ENCOUNTER — Encounter: Payer: Self-pay | Admitting: Hematology

## 2022-06-04 NOTE — Telephone Encounter (Signed)
OK with me if she wishes to keep appointment for clinic visit!

## 2022-06-10 NOTE — Progress Notes (Unsigned)
Synopsis: Referred for dyspnea by Lavone Orn, MD  Subjective:   PATIENT ID: Megan [name] GENDER: female DOB: 1955/09/17, MRN: [id number]  No chief complaint on file.  [name] with history of AR, asthma, hypothyroid, atrial septal aneurysm extending into RVOT, MVP, pneumothorax, idiopathic neutropenia, never smoker referred for dyspnea.  In 2009 she had a pneumonia and atelectasis and required bronchoscopy.  She says that she was just started on evenity. Before this she had some work done in her crawlspace and with some brown dust it was cleaned, fogged. Smelled something relatead to this for a while. She had pleuritic CP after first dose which resolved after first day. In ED she had CXR which said interval diffuse interstitial opacities. She did feels some DOE when walking more than a healthy peer.   She is very sensitive to smells, perfumes, etc. She'll feel dyspneic.    She worked in [employer] fundraiser, now does Museum/gallery conservator. No frequent exposure to dusts/solvents without a mask other than episode described above.  Interval HPI: Positive covid-19 test on 8/4. Seen in ED 8/19 for chest tightness, dyspnea, cough without fever. CTA Chest in ED was stable, no PE. Given nebs, prednisone taper.  She has been taking prednisone dose pack 40 mg, 30 mg, 20 mg, 10 gm daily and asks when she can stop course. Cough, chest tightness and dyspnea are markedly improved. She does still have some sharp CP when taking deep breaths that resolves after a couple seconds and she mentions chest burning sensation after taking albuterol that lasts for maybe 41mnutes but still feels like she overall gets relief from using inhaler.   Asks about inhaled glutathione.   ------------------------------ Tested positive for covid 10/8 again!  Saw Dr. OAnder Slade9/6, encouraged to continue wixela - with high deductible any ICS/LABA will be expensive.  [facility] GCorrin Parkerwho said that I  diagnosed 'acute asthma' at last visit, which is inaccurate. I recommended therapeutic trial of ICS/LABA for some form of bronchiolitis/small airways disease - one possibility of which is asthma.    Otherwise pertinent review of systems is negative.  Past Medical History:  Diagnosis Date   Allergic rhinitis    Allergy    Aneurysm of cardiac wall, congenital    a.  septal aneurysm extending into the RVOT (by cardiac MRI in 08/2013)   Anxiety    Aortic atherosclerosis (HCC)    Arthritis    Asthma    Basal cell carcinoma    Depression    Heart murmur    Hypothyroidism    on meds   Leukopenia    idiopathic   Lyme disease    Mitral valve prolapse    MVP (mitral valve prolapse)    Orthostatic hypotension    Osteoporosis    Pancreatic cyst    Peripheral neuropathy 06/09/2012   Pernicious anemia    Pernicious anemia    Pernicious anemia    Plantar fasciitis    Pneumothorax      Family History  Problem Relation Age of Onset   Hyperlipidemia Father    Heart failure Father    Prostate cancer Father 656  Kidney failure Father    Hypertension Father    Colon polyps Father    Angina Mother    Hypertension Mother    Lung cancer Mother 779      again at 724/79-smoker  Colon cancer Mother 675  Colon polyps Mother    Lung  cancer Paternal Grandfather    Breast cancer Paternal Grandmother    Diabetes Paternal Grandmother    Esophageal cancer Neg Hx    Liver cancer Neg Hx    Pancreatic cancer Neg Hx    Rectal cancer Neg Hx    Stomach cancer Neg Hx      Past Surgical History:  Procedure Laterality Date   COLONOSCOPY  2019   JMP-   COLONOSCOPY WITH PROPOFOL N/A 02/22/2013   Procedure: COLONOSCOPY WITH PROPOFOL;  Surgeon: Garlan Fair, MD;  Location: [facility];  Service: Endoscopy;  Laterality: N/A;   MOUTH SURGERY  01/2012   bone graft in mouth   NASAL SINUS SURGERY Right 06/12/2014   Procedure: RIGHT ENDOSCOPIC MAXILLARY ANTROSTOMY;  Surgeon: Ascencion Dike, MD;   Location: [facility];  Service: ENT;  Laterality: Right;   ROOT CANAL  02/27/2012   TONSILLECTOMY  1962   WISDOM TOOTH EXTRACTION      Social History   Socioeconomic History   Marital status: Divorced    Spouse name: Not on file   Number of children: 0   Years of education: Not on file   Highest education level: Not on file  Occupational History    Comment: Career and life coach  Tobacco Use   Smoking status: Never   Smokeless tobacco: Never  Vaping Use   Vaping Use: Never used  Substance and Sexual Activity   Alcohol use: No    Alcohol/week: 0.0 standard drinks of alcohol   Drug use: No   Sexual activity: Not Currently  Other Topics Concern   Not on file  Social History Narrative   Patient is single and lives alone.   Patient is self-employed, career and life coaching.   Patient drinks two to four cups of caffeine daily.   Right handed    Social Determinants of Health   Financial Resource Strain: Not on file  Food Insecurity: Not on file  Transportation Needs: Not on file  Physical Activity: Not on file  Stress: Not on file  Social Connections: Not on file  Intimate Partner Violence: Not on file     Allergies  Allergen Reactions   Augmentin [Amoxicillin-Pot Clavulanate] Diarrhea   Clindamycin/Lincomycin Diarrhea   Iron     Other reaction(s): severe constipation   Moxifloxacin Other (See Comments)    neuropathy Other reaction(s): neuropathy     Outpatient Medications Prior to Visit  Medication Sig Dispense Refill   5-Hydroxytryptophan (5-HTP) 100 MG CAPS Take 100 mg by mouth daily.      albuterol (VENTOLIN HFA) 108 (90 Base) MCG/ACT inhaler Inhale 1 puff into the lungs daily as needed for wheezing or shortness of breath.      ARMOUR THYROID 30 MG tablet Take 1 tablet (30 mg total) by mouth daily. 90 tablet 3   Ascorbic Acid (VITAMIN C) POWD Take 2,500 mg by mouth daily.     Cholecalciferol (VITAMIN D3) 10000 units capsule Take 10,000 Units  by mouth daily.      Cyanocobalamin (VITAMIN B-12 ER PO) Take 5,000 mcg by mouth daily.      estradiol (ESTRACE) 0.1 MG/GM vaginal cream Place 1 Applicatorful vaginally 3 (three) times a week.     fluticasone-salmeterol (WIXELA INHUB) 100-50 MCG/ACT AEPB Inhale 1 puff into the lungs 2 (two) times daily. 60 each 11   loratadine (CLARITIN) 10 MG tablet Take 10 mg by mouth daily.     LORazepam (ATIVAN) 0.5 MG tablet Take 0.25-0.5 mg by  mouth daily as needed.     MAGNESIUM CITRATE PO Take 600 mg by mouth daily.     Menaquinone-7 (VITAMIN K2 PO) Take 1 tablet by mouth daily.     NON FORMULARY Take 1,000 mcg by mouth daily. Methyl Folate chewable tablet     NONFORMULARY OR COMPOUNDED ITEM Apply 2 drops topically in the morning. Estradiol-Progesterone 0.06-5-0.33 MG liquid drops     progesterone (PROMETRIUM) 100 MG capsule Take 0.5 capsules by mouth daily.     Romosozumab-aqqg (EVENITY) 105 MG/1.17ML SOSY injection      No facility-administered medications prior to visit.       Objective:   Physical Exam:  General appearance: 66 y.o., female, NAD, conversant  Eyes: anicteric sclerae, moist conjunctivae; no lid-lag; PERRL, tracking appropriately HENT: NCAT; oropharynx, MMM, no mucosal ulcerations; normal hard and soft palate Neck: Trachea midline; no lymphadenopathy, no JVD Lungs: CTAB, no wheeze, with normal respiratory effort CV: RRR, no MRGs  Abdomen: Soft, non-tender; non-distended, BS present  Extremities: No peripheral edema, radial and DP pulses present bilaterally  Skin: Normal temperature, turgor and texture; no rash Psych: Appropriate affect Neuro: Alert and oriented to person and place, no focal deficit    There were no vitals filed for this visit.     on RA BMI Readings from Last 3 Encounters:  05/08/22 19.35 kg/m  04/30/22 19.46 kg/m  04/21/22 19.48 kg/m   Wt Readings from Last 3 Encounters:  05/08/22 131 lb (59.4 kg)  04/30/22 131 lb 12.8 oz (59.8 kg)   04/21/22 131 lb 14.4 oz (59.8 kg)     CBC    Component Value Date/Time   WBC 3.6 (L) 04/21/2022 0921   WBC 3.8 (L) 04/11/2022 2029   RBC 4.56 04/21/2022 0921   HGB 14.1 04/21/2022 0921   HGB 13.7 03/15/2013 1144   HCT 41.9 04/21/2022 0921   HCT 40.8 03/15/2013 1144   PLT 176 04/21/2022 0921   PLT 210 03/15/2013 1144   MCV 91.9 04/21/2022 0921   MCV 88.9 03/15/2013 1144   MCH 30.9 04/21/2022 0921   MCHC 33.7 04/21/2022 0921   RDW 12.6 04/21/2022 0921   RDW 13.8 03/15/2013 1144   LYMPHSABS 1.0 04/21/2022 0921   LYMPHSABS 1.0 03/15/2013 1144   MONOABS 0.3 04/21/2022 0921   MONOABS 0.3 03/15/2013 1144   EOSABS 0.1 04/21/2022 0921   EOSABS 0.0 03/15/2013 1144   BASOSABS 0.0 04/21/2022 0921   BASOSABS 0.1 03/15/2013 1144    No remarkable eosinophilia  Chest Imaging: HRCT Chest 04/2021 reviewed by me and remarkable for subtle centrilobular nodules, TIB, definite mosaicism on expiratory imaging  CTA Chest 04/11/22 reviewed by me - stable  Pulmonary Functions Testing Results:     No data to display             Echocardiogram:   12/2016: - Normal LV systolic and diastolic function; 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      Assessment & Plan:   # Centrilobular nodules, mosaicism: irritant asthma or bronchiolitis obliterans with inhalation of chemical irritant vs hypersensitivity pneumonitis (mold exposure and disruption at home). Covid-19 related small airways disease or simply post viral cough/bronchospasm may also being playing role.   # Ivermectin use:  Plan: - 2 more days of prednisone 10 mg daily - prn albuterol - let us know if you'd like [place] to fill prescription for LABA/ICS inhaler to try controlling  asthma if still feeling unwell - counseled against ivermectin use for covid, she asks to test LFTs - researched nebulized glutathione and sent my chart message recommending against it -  PFTs (breathing tests) in 6 weeks on same day as next appointment provided you stay off of LABA/ICS inhaler for 4 weeks before PFTs. If you end up starting Laba/ICS inhaler then cancel PFTs and postpone clinic appointment with me until 3 months.     I spent 30 minutes dedicated to the care of this patient on the date of this encounter to include pre-visit review of records, face-to-face time with the patient discussing conditions above, post visit ordering of testing, clinical documentation with the electronic health record, and communicating necessary findings to members of the patients care team.       Maryjane Hurter, MD [facility] 06/10/2022 10:03 AM

## 2022-06-11 ENCOUNTER — Encounter: Payer: Self-pay | Admitting: Student

## 2022-06-11 ENCOUNTER — Ambulatory Visit (INDEPENDENT_AMBULATORY_CARE_PROVIDER_SITE_OTHER): Payer: Medicare Other | Admitting: Student

## 2022-06-11 VITALS — BP 102/60 | HR 71 | Temp 98.1°F | Ht 69.0 in | Wt 129.6 lb

## 2022-06-11 DIAGNOSIS — J219 Acute bronchiolitis, unspecified: Secondary | ICD-10-CM

## 2022-06-11 DIAGNOSIS — U071 COVID-19: Secondary | ICD-10-CM

## 2022-06-11 MED ORDER — AIRDUO DIGIHALER 55-14 MCG/ACT IN AEPB: 1.0000 | INHALATION_SPRAY | Freq: Two times a day (BID) | RESPIRATORY_TRACT | 11 refills | Status: DC

## 2022-06-11 MED ORDER — BENZONATATE 200 MG PO CAPS: 200.0000 mg | ORAL_CAPSULE | Freq: Three times a day (TID) | ORAL | 1 refills | Status: DC | PRN

## 2022-06-11 NOTE — Patient Instructions (Addendum)
-   labs today - delsym or tessalon perles to suppress cough - if chest tightness, worsening shortness of breath then start airduo 1 puff twice daily, rinse mouth/gargle after use - If persistent despite above measures then call our clinic and we can consider course of prednisone - Need to be off of airduo for 3 weeks before PFTs - see you in a few months or sooner if need be!

## 2022-06-12 ENCOUNTER — Telehealth: Payer: Self-pay | Admitting: Student

## 2022-06-12 ENCOUNTER — Encounter: Payer: Self-pay | Admitting: Student

## 2022-06-12 LAB — IGG, IGA, IGM
IgG (Immunoglobin G), Serum: 630 mg/dL (ref 600–1540)
IgM, Serum: 101 mg/dL (ref 50–300)
Immunoglobulin A: 114 mg/dL (ref 70–320)

## 2022-06-12 NOTE — Telephone Encounter (Signed)
Fax received from pt's pharmacy stating that the Airduo Digihaler is not covered by [employer]. Preferred alternatives include Advair HFA, Advair Diskus, Dulera, Breo Ellipta.  Dr. Verlee Monte, please advise.

## 2022-06-13 ENCOUNTER — Encounter: Payer: Self-pay | Admitting: Student

## 2022-06-13 DIAGNOSIS — U071 COVID-19: Secondary | ICD-10-CM

## 2022-06-13 LAB — SARS-COV-2 ANTIBODIES: SARS-CoV-2 Antibodies: NEGATIVE

## 2022-06-13 NOTE — Telephone Encounter (Signed)
Test billing 9/11 had shown that her deductible hadn't been met and cost for other laba/ics inhalers would be prohibitive. She plans to use goodrx coupon without insurance for airduo.

## 2022-06-13 NOTE — Telephone Encounter (Signed)
Received the following message from patient:   "Dr. Verlee Monte, You asked me to reach out to you when my test results are in, and both the Immunoglobulin and SARS-CoV-2 Ab, Nucleocapsid results are available.  I would love your interpretation.  Shouldn't I have made antibodies to COVID, given I have had two positive PCR tests and been sick, in the last two months?  Looking forward to learning what these both mean.  The SARS test is on [facility]'s site but I have not seen it on [facility] as of early this morning. Thanks, [name]"  Dr. Verlee Monte, can you please advise? Thanks!

## 2022-06-16 ENCOUNTER — Other Ambulatory Visit: Payer: Medicare Other

## 2022-06-16 DIAGNOSIS — U071 COVID-19: Secondary | ICD-10-CM

## 2022-06-16 MED ORDER — FLUTICASONE-SALMETEROL 113-14 MCG/ACT IN AEPB: 1.0000 | INHALATION_SPRAY | Freq: Two times a day (BID) | RESPIRATORY_TRACT | 11 refills | Status: DC

## 2022-06-19 ENCOUNTER — Encounter: Payer: Self-pay | Admitting: Student

## 2022-06-19 DIAGNOSIS — U071 COVID-19: Secondary | ICD-10-CM

## 2022-06-19 LAB — SARS-COV-2 SEMI-QUANTITATIVE TOTAL ANTIBODY, SPIKE: SARS COV2 AB, Total Spike Semi QN: >2500 U/mL — ABNORMAL HIGH (ref <0.8)

## 2022-06-19 MED ORDER — FLUTICASONE-SALMETEROL 113-14 MCG/ACT IN AEPB: 1.0000 | INHALATION_SPRAY | Freq: Two times a day (BID) | RESPIRATORY_TRACT | 11 refills | Status: DC

## 2022-06-19 NOTE — Telephone Encounter (Signed)
Fax received from pt's pharmacy stating that the Airduo is requiring a PA. The preferred inhalers are either Advair, Breo, or Dulera. Dr. Verlee Monte, please advise on this.

## 2022-06-19 NOTE — Telephone Encounter (Signed)
Pt wanted to ask you to look at her SARS COV2 Spike test and give her your medical opinion. Please advise. Thank you.

## 2022-06-19 NOTE — Addendum Note (Signed)
Addended by: Maryjane Hurter on: 06/19/2022 06:56 PM   Modules accepted: Orders

## 2022-06-23 NOTE — Telephone Encounter (Signed)
She plans to use goodrx, not file with insurance

## 2022-06-23 NOTE — Telephone Encounter (Signed)
Fax received from pt's pharmacy in regards to the generic fluticasone/salmeterol that was prescribed. This states that the generic fluticasone/salmeterol is not covered by pt's plan.   Preferred alternatives include Advair, Advair Diskus (not generic), Dulera, and Breo.  Dr. Verlee Monte, please advise.

## 2022-06-24 ENCOUNTER — Ambulatory Visit: Payer: Medicare Other | Admitting: Podiatry

## 2022-06-25 ENCOUNTER — Other Ambulatory Visit: Payer: Medicare Other

## 2022-06-25 ENCOUNTER — Ambulatory Visit: Payer: Medicare Other | Admitting: Infectious Diseases

## 2022-06-25 ENCOUNTER — Telehealth: Payer: Self-pay

## 2022-06-25 NOTE — Telephone Encounter (Signed)
-----   Message from Rosiland Oz, MD sent at 06/25/2022  9:19 AM EDT ----- Regarding: COVID+ Team   This patient on my schedule today  seen in the [facility] ) has been  tested positive for COVID on 10/30. I would reschedule her clinic visit to at least 5 days after 10/30 given her clinical symptoms are improving and she is afebrile for 24 hrs prior to the visit.   She should go to the ED if any SOB, fevers, etc

## 2022-06-25 NOTE — Telephone Encounter (Signed)
Patient aware. Patient rescheduled to 11/7.   [place], CMA

## 2022-06-25 NOTE — Telephone Encounter (Signed)
Left voicemail asking patient to return my call.   Jahvon Gosline P Saquoia Sianez, CMA  

## 2022-07-01 ENCOUNTER — Other Ambulatory Visit: Payer: Self-pay

## 2022-07-01 ENCOUNTER — Ambulatory Visit (INDEPENDENT_AMBULATORY_CARE_PROVIDER_SITE_OTHER): Payer: Medicare Other | Admitting: Internal Medicine

## 2022-07-01 ENCOUNTER — Encounter: Payer: Self-pay | Admitting: Internal Medicine

## 2022-07-01 VITALS — BP 102/63 | HR 72 | Temp 97.5°F | Ht 69.25 in | Wt 131.0 lb

## 2022-07-01 DIAGNOSIS — R768 Other specified abnormal immunological findings in serum: Secondary | ICD-10-CM | POA: Diagnosis present

## 2022-07-01 DIAGNOSIS — D508 Other iron deficiency anemias: Secondary | ICD-10-CM

## 2022-07-01 NOTE — Progress Notes (Signed)
[facility] for Infectious Disease      Reason for Consult: positive IgG spike protein for COVID    Referring Physician: Dr. Verlee Monte    Patient ID: Megan [name], female    DOB: 09/18/1955, 66 y.o.   MRN: [id number]  HPI:   She is here for evaluation of a positive Covid antibody test and a previous negative test.   She has a  history of COVID infection in August and again in October and was symptomatic with both including fever, cough, shortness of breath and recovered.  She had not had a previous history of COVID infection until then.  She had started Evista not long before that for osteoporosis but no other changes.  She also has a history of bilateral lower extremity nerve pain and swelling which is chronic and a burning tongue.  She previously had the [employer] COVID vaccine and had a poor reaction to it with hypotension and required a heart monitor for 2 weeks.  She has not had another vaccine since that time.  She is here with an initial negative antibody test with normal immunoglobulins and then a positive spike protein of > 2500.     Past Medical History:  Diagnosis Date   Allergic rhinitis    Allergy    Aneurysm of cardiac wall, congenital    a.  septal aneurysm extending into the RVOT (by cardiac MRI in 08/2013)   Anxiety    Aortic atherosclerosis (HCC)    Arthritis    Asthma    Basal cell carcinoma    Depression    Heart murmur    Hypothyroidism    on meds   Leukopenia    idiopathic   Lyme disease    Mitral valve prolapse    MVP (mitral valve prolapse)    Orthostatic hypotension    Osteoporosis    Pancreatic cyst    Peripheral neuropathy 06/09/2012   Pernicious anemia    Pernicious anemia    Pernicious anemia    Plantar fasciitis    Pneumothorax     Prior to Admission medications   Medication Sig Start Date End Date Taking? Authorizing Provider  5-Hydroxytryptophan (5-HTP) 100 MG CAPS Take 100 mg by mouth daily.    Yes [provider]   albuterol (VENTOLIN HFA) 108 (90 Base) MCG/ACT inhaler Inhale 1 puff into the lungs daily as needed for wheezing or shortness of breath.  05/24/20  Yes [provider]  ARMOUR THYROID 30 MG tablet Take 1 tablet (30 mg total) by mouth daily. 04/09/22  Yes Philemon Kingdom, MD  Ascorbic Acid (VITAMIN C) POWD Take 2,500 mg by mouth daily.   Yes [provider]  Cholecalciferol (VITAMIN D3) 10000 units capsule Take 10,000 Units by mouth daily.    Yes [provider]  Cyanocobalamin (VITAMIN B-12 ER PO) Take 5,000 mcg by mouth daily.    Yes [provider]  estradiol (ESTRACE) 0.1 MG/GM vaginal cream Place 1 Applicatorful vaginally 3 (three) times a week.   Yes [provider]  Fluticasone-Salmeterol 113-14 MCG/ACT AEPB Inhale 1 puff into the lungs in the morning and at bedtime. 06/19/22  Yes Maryjane Hurter, MD  LORazepam (ATIVAN) 0.5 MG tablet Take 0.25-0.5 mg by mouth daily as needed. 12/06/21  Yes [provider]  MAGNESIUM CITRATE PO Take 600 mg by mouth daily.   Yes [provider]  Menaquinone-7 (VITAMIN K2 PO) Take 1 tablet by mouth daily.   Yes [provider]  NON FORMULARY Take 1,000 mcg by mouth daily. Methyl Folate chewable tablet   Yes [provider]  NONFORMULARY OR COMPOUNDED ITEM Apply 2 drops topically in the morning. Estradiol-Progesterone 0.06-5-0.33 MG liquid drops   Yes [provider]  progesterone (PROMETRIUM) 100 MG capsule Take 0.5 capsules by mouth daily.   Yes [provider]  Romosozumab-aqqg (EVENITY) 105 MG/1.17ML SOSY injection  03/07/21  Yes [provider]  benzonatate (TESSALON) 200 MG capsule Take 1 capsule (200 mg total) by mouth 3 (three) times daily as needed for cough. 06/11/22   Maryjane Hurter, MD    Allergies  Allergen Reactions   Augmentin [Amoxicillin-Pot Clavulanate] Diarrhea   Clindamycin/Lincomycin Diarrhea   Iron     Other reaction(s):  severe constipation   Moxifloxacin Other (See Comments)    neuropathy Other reaction(s): neuropathy    Social History   Tobacco Use   Smoking status: Never   Smokeless tobacco: Never  Vaping Use   Vaping Use: Never used  Substance Use Topics   Alcohol use: No    Alcohol/week: 0.0 standard drinks of alcohol   Drug use: No    Family History  Problem Relation Age of Onset   Hyperlipidemia Father    Heart failure Father    Prostate cancer Father 28   Kidney failure Father    Hypertension Father    Colon polyps Father    Angina Mother    Hypertension Mother    Lung cancer Mother 55       again at 64/79-smoker   Colon cancer Mother 8   Colon polyps Mother    Lung cancer Paternal Grandfather    Breast cancer Paternal Grandmother    Diabetes Paternal Grandmother    Esophageal cancer Neg Hx    Liver cancer Neg Hx    Pancreatic cancer Neg Hx    Rectal cancer Neg Hx    Stomach cancer Neg Hx     Review of Systems  Constitutional: negative for fevers and chills All other systems reviewed and are negative    Constitutional: in no apparent distress  Vitals:   07/01/22 0847  BP: 102/63  Pulse: 72  Temp: (!) 97.5 F (36.4 C)  SpO2: 100%   EYES: anicteric ENMT: no thrush Respiratory: normal respiratory effort Musculoskeletal: no edema Skin: no rash  Labs: Lab Results  Component Value Date   WBC 3.6 (L) 04/21/2022   HGB 14.1 04/21/2022   HCT 41.9 04/21/2022   MCV 91.9 04/21/2022   PLT 176 04/21/2022    Lab Results  Component Value Date   CREATININE 0.61 04/21/2022   BUN 11 04/21/2022   NA 138 04/21/2022   K 4.3 04/21/2022   CL 108 04/21/2022   CO2 27 04/21/2022    Lab Results  Component Value Date   ALT 9 04/21/2022   AST 20 04/21/2022   ALKPHOS 53 04/21/2022   BILITOT 0.6 04/21/2022   INR 1.1 07/18/2008     Assessment: she has variable results of her antibody testing with one negative and one highly positive.  I would suspect one is a false  positive, though it is unknown which would be false.  Her immunoglobulins are normal so I do not suspect she is overly susceptible to COVID though having it twice in two months is unusual.  She is on Evista which does carry an increased infection risk on the adverse reaction profile so certainly this is a possible cause of her recent infections,  though there is no way to know for certain. I have not recommended any other antibody testing and I do recommend vaccination, though she will have good protection with recent infections.   She also asked about her symptoms of bilateral leg nerve pain and swelling.  She has been told by integrative medicine providers that it is Lyme disease and did show some unvalidated tests that were positive.  To me, it sounds like peripheral neuropathy of unknown etiology.   She also complained of a burning mouth for about 3 months and had this previously.  No new medications otherwise.  She has no thrush or mouth sores to account for it.  I do not have an explanation for this.    Plan: 1)  vaccine recommended and she will consider  She can otherwise follow up as needed  I have personally spent 60 minutes involved in face-to-face and non-face-to-face activities for this patient on the day of the visit. Professional time spent includes the following activities: Preparing to see the patient (review of tests), Obtaining and/or reviewing separately obtained history (admission/discharge record), Performing a medically appropriate examination and/or evaluation , Ordering medications/tests/procedures, referring and communicating with other health care professionals, Documenting clinical information in the EMR, Independently interpreting results (not separately reported), Communicating results to the patient/family/caregiver, Counseling and educating the patient/family/caregiver and Care coordination (not separately reported).

## 2022-07-02 ENCOUNTER — Inpatient Hospital Stay: Payer: Medicare Other | Attending: Hematology

## 2022-07-02 ENCOUNTER — Encounter: Payer: Self-pay | Admitting: Cardiology

## 2022-07-02 DIAGNOSIS — D508 Other iron deficiency anemias: Secondary | ICD-10-CM

## 2022-07-02 DIAGNOSIS — E7800 Pure hypercholesterolemia, unspecified: Secondary | ICD-10-CM

## 2022-07-02 DIAGNOSIS — E039 Hypothyroidism, unspecified: Secondary | ICD-10-CM | POA: Insufficient documentation

## 2022-07-02 DIAGNOSIS — D510 Vitamin B12 deficiency anemia due to intrinsic factor deficiency: Secondary | ICD-10-CM | POA: Diagnosis present

## 2022-07-02 LAB — CBC WITH DIFFERENTIAL (CANCER CENTER ONLY)
Abs Immature Granulocytes: 0 10*3/uL (ref 0.00–0.07)
Basophils Absolute: 0 10*3/uL (ref 0.0–0.1)
Basophils Relative: 1 %
Eosinophils Absolute: 0.1 10*3/uL (ref 0.0–0.5)
Eosinophils Relative: 2 %
HCT: 40.3 % (ref 36.0–46.0)
Hemoglobin: 13.5 g/dL (ref 12.0–15.0)
Immature Granulocytes: 0 %
Lymphocytes Relative: 28 %
Lymphs Abs: 0.8 10*3/uL (ref 0.7–4.0)
MCH: 31.7 pg (ref 26.0–34.0)
MCHC: 33.5 g/dL (ref 30.0–36.0)
MCV: 94.6 fL (ref 80.0–100.0)
Monocytes Absolute: 0.3 10*3/uL (ref 0.1–1.0)
Monocytes Relative: 8 %
Neutro Abs: 1.8 10*3/uL (ref 1.7–7.7)
Neutrophils Relative %: 61 %
Platelet Count: 167 10*3/uL (ref 150–400)
RBC: 4.26 MIL/uL (ref 3.87–5.11)
RDW: 13.1 % (ref 11.5–15.5)
WBC Count: 3 10*3/uL — ABNORMAL LOW (ref 4.0–10.5)
nRBC: 0 % (ref 0.0–0.2)

## 2022-07-02 LAB — CMP (CANCER CENTER ONLY)
ALT: 12 U/L (ref 0–44)
AST: 23 U/L (ref 15–41)
Albumin: 4.5 g/dL (ref 3.5–5.0)
Alkaline Phosphatase: 68 U/L (ref 38–126)
Anion gap: 4 — ABNORMAL LOW (ref 5–15)
BUN: 18 mg/dL (ref 8–23)
CO2: 28 mmol/L (ref 22–32)
Calcium: 9.2 mg/dL (ref 8.9–10.3)
Chloride: 109 mmol/L (ref 98–111)
Creatinine: 0.61 mg/dL (ref 0.44–1.00)
GFR, Estimated: >60 mL/min (ref >60)
Glucose, Bld: 100 mg/dL — ABNORMAL HIGH (ref 70–99)
Potassium: 4 mmol/L (ref 3.5–5.1)
Sodium: 141 mmol/L (ref 135–145)
Total Bilirubin: 0.5 mg/dL (ref 0.3–1.2)
Total Protein: 6.6 g/dL (ref 6.5–8.1)

## 2022-07-02 LAB — VITAMIN B12: Vitamin B-12: 1341 pg/mL — ABNORMAL HIGH (ref 180–914)

## 2022-07-02 LAB — LIPID PANEL
Cholesterol: 193 mg/dL (ref 0–200)
HDL: 72 mg/dL (ref >40)
LDL Cholesterol: 117 mg/dL — ABNORMAL HIGH (ref 0–99)
Total CHOL/HDL Ratio: 2.7 RATIO
Triglycerides: 18 mg/dL (ref <150)
VLDL: 4 mg/dL (ref 0–40)

## 2022-07-02 LAB — IRON AND IRON BINDING CAPACITY (CC-WL,HP ONLY)
Iron: 97 ug/dL (ref 28–170)
Saturation Ratios: 31 % (ref 10.4–31.8)
TIBC: 312 ug/dL (ref 250–450)
UIBC: 215 ug/dL (ref 148–442)

## 2022-07-02 LAB — CEA (ACCESS): CEA (CHCC): 1.9 ng/mL (ref 0.00–5.00)

## 2022-07-02 LAB — FERRITIN: Ferritin: 188 ng/mL (ref 11–307)

## 2022-07-04 ENCOUNTER — Ambulatory Visit (INDEPENDENT_AMBULATORY_CARE_PROVIDER_SITE_OTHER): Payer: Medicare Other | Admitting: Podiatrist

## 2022-07-04 DIAGNOSIS — M79675 Pain in left toe(s): Secondary | ICD-10-CM | POA: Diagnosis not present

## 2022-07-04 DIAGNOSIS — B351 Tinea unguium: Secondary | ICD-10-CM

## 2022-07-04 DIAGNOSIS — M79674 Pain in right toe(s): Secondary | ICD-10-CM

## 2022-07-07 NOTE — Progress Notes (Signed)
Chief Complaint  Patient presents with   routine foot care    66 year old female presents for above complaints.  She has the nails are thickened and causing discomfort.    No swelling redness or drainage.  No open lesions.   She follows with Dr. Doran Durand for AVN on the right 2nd metatarsal.    Objective: AAO x3, NAD DP/PT pulses palpable bilaterally, CRT less than 3 seconds Nails are hypertrophic, dystrophic, brittle, discolored, elongated 10. No surrounding redness or drainage. Tenderness nails 1-5 bilaterally.  Mild incurvation of the nail borders of the right hallux toenail.  No open lesions or pre-ulcerative lesions are identified today. No pain with calf compression, swelling, warmth, erythema   Assessment: symptomatic onychomycosis   Plan: -All treatment options discussed with the patient including all alternatives, risks, complications.  -Sharply debrided the nails x10 without any complications or bleeding.  In particular try debride the ingrown toenails with any complications of bleeding. -Daily foot inspection

## 2022-07-08 ENCOUNTER — Other Ambulatory Visit: Payer: Self-pay | Admitting: Sports Medicine

## 2022-08-13 ENCOUNTER — Other Ambulatory Visit: Payer: Self-pay

## 2022-08-13 ENCOUNTER — Ambulatory Visit (INDEPENDENT_AMBULATORY_CARE_PROVIDER_SITE_OTHER): Payer: Medicare Other | Admitting: Internal Medicine

## 2022-08-13 ENCOUNTER — Encounter: Payer: Self-pay | Admitting: Internal Medicine

## 2022-08-13 VITALS — BP 108/71 | HR 61 | Temp 97.4°F | Wt 130.0 lb

## 2022-08-13 DIAGNOSIS — R5382 Chronic fatigue, unspecified: Secondary | ICD-10-CM | POA: Diagnosis not present

## 2022-08-14 ENCOUNTER — Encounter: Payer: Self-pay | Admitting: Internal Medicine

## 2022-08-14 NOTE — Progress Notes (Signed)
[facility] for Infectious Disease      Reason for Consult:recent allergy testing    Referring Physician: Dr. Laurann Montana    Patient ID: Megan [name], female    DOB: Dec 19, 1955, 66 y.o.   MRN: [id number]  HPI:   Megan [name] is here for a new problem with recent results of allergy and mold testing. She saw her provider at [facility] recently who had her undergo blood antibody testing for mold.  She brings in the results to review with me.  She was tested for mold toxins, aspergillus/penicillium and other molds and the report shows positive IgG antibodies.  She was told by the provider that she will need a 6-12 month course of anti-fungal treatment with preferrably itraconazole.  She was started initially on fluconazole now, though has not started.  She is asking if the recommendation makes sense from my standpoint.    Past Medical History:  Diagnosis Date   Allergic rhinitis    Allergy    Aneurysm of cardiac wall, congenital    a.  septal aneurysm extending into the RVOT (by cardiac MRI in 08/2013)   Anxiety    Aortic atherosclerosis (HCC)    Arthritis    Asthma    Basal cell carcinoma    Depression    Heart murmur    Hypothyroidism    on meds   Leukopenia    idiopathic   Lyme disease    Mitral valve prolapse    MVP (mitral valve prolapse)    Orthostatic hypotension    Osteoporosis    Pancreatic cyst    Peripheral neuropathy 06/09/2012   Pernicious anemia    Pernicious anemia    Pernicious anemia    Plantar fasciitis    Pneumothorax     Prior to Admission medications   Medication Sig Start Date End Date Taking? Authorizing Provider  5-Hydroxytryptophan (5-HTP) 100 MG CAPS Take 100 mg by mouth daily.    Yes [provider]  albuterol (VENTOLIN HFA) 108 (90 Base) MCG/ACT inhaler Inhale 1 puff into the lungs daily as needed for wheezing or shortness of breath.  05/24/20  Yes [provider]  ARMOUR THYROID 30 MG tablet Take 1 tablet  (30 mg total) by mouth daily. 04/09/22  Yes Philemon Kingdom, MD  Ascorbic Acid (VITAMIN C) POWD Take 2,500 mg by mouth daily.   Yes [provider]  Cholecalciferol (VITAMIN D3) 10000 units capsule Take 10,000 Units by mouth daily.    Yes [provider]  Cyanocobalamin (VITAMIN B-12 ER PO) Take 5,000 mcg by mouth daily.    Yes [provider]  estradiol (ESTRACE) 0.1 MG/GM vaginal cream Place 1 Applicatorful vaginally 3 (three) times a week.   Yes [provider]  Fluticasone-Salmeterol 113-14 MCG/ACT AEPB Inhale 1 puff into the lungs in the morning and at bedtime. 06/19/22  Yes Maryjane Hurter, MD  LORazepam (ATIVAN) 0.5 MG tablet Take 0.25-0.5 mg by mouth daily as needed. 12/06/21  Yes [provider]  MAGNESIUM CITRATE PO Take 600 mg by mouth daily.   Yes [provider]  Menaquinone-7 (VITAMIN K2 PO) Take 1 tablet by mouth daily.   Yes [provider]  NON FORMULARY Take 1,000 mcg by mouth daily. Methyl Folate chewable tablet   Yes [provider]  progesterone (PROMETRIUM) 100 MG capsule Take 0.5 capsules by mouth daily.   Yes [provider]  Romosozumab-aqqg (EVENITY) 105 MG/1.17ML SOSY injection  03/07/21  Yes [provider]  NONFORMULARY OR COMPOUNDED ITEM Apply 2 drops topically in the morning. Estradiol-Progesterone 0.06-5-0.33 MG liquid drops    [provider]    Allergies  Allergen Reactions   Augmentin [Amoxicillin-Pot Clavulanate] Diarrhea   Clindamycin/Lincomycin Diarrhea   Iron     Other reaction(s): severe constipation   Moxifloxacin Other (See Comments)    neuropathy Other reaction(s): neuropathy   Sodium Citrate     Other Reaction(s): GI Upset (intolerance)    Social History   Tobacco Use   Smoking status: Never   Smokeless tobacco: Never  Vaping Use   Vaping Use: Never used  Substance Use Topics   Alcohol use: No    Alcohol/week: 0.0 standard drinks of  alcohol   Drug use: No    Family History  Problem Relation Age of Onset   Hyperlipidemia Father    Heart failure Father    Prostate cancer Father 65   Kidney failure Father    Hypertension Father    Colon polyps Father    Angina Mother    Hypertension Mother    Lung cancer Mother 61       again at 44/79-smoker   Colon cancer Mother 8   Colon polyps Mother    Lung cancer Paternal Grandfather    Breast cancer Paternal Grandmother    Diabetes Paternal Grandmother    Esophageal cancer Neg Hx    Liver cancer Neg Hx    Pancreatic cancer Neg Hx    Rectal cancer Neg Hx    Stomach cancer Neg Hx     Review of Systems  Constitutional: negative for fevers and chills All other systems reviewed and are negative    Constitutional: in no apparent distress and oriented times 3  Vitals:   08/13/22 1114  BP: 108/71  [name]: 61  Temp: (!) 97.4 F (36.3 C)  SpO2: 97%   EYES: anicteric ENMT:no thrush Respiratory: normal respiratory effort  Labs: Lab Results  Component Value Date   WBC 3.0 (L) 07/02/2022   HGB 13.5 07/02/2022   HCT 40.3 07/02/2022   MCV 94.6 07/02/2022   PLT 167 07/02/2022    Lab Results  Component Value Date   CREATININE 0.61 07/02/2022   BUN 18 07/02/2022   NA 141 07/02/2022   K 4.0 07/02/2022   CL 109 07/02/2022   CO2 28 07/02/2022    Lab Results  Component Value Date   ALT 12 07/02/2022   AST 23 07/02/2022   ALKPHOS 68 07/02/2022   BILITOT 0.5 07/02/2022   INR 1.1 07/18/2008     Assessment: positive findings of antibodies of unknown utility.  I reviewed the results which are from a non=FDA approved lab with unknown validation. I discussed that it is very difficult to interpret results obtained from a lab with no validated interpretation.  I also discussed indications for treatment of invasive aspergillosis or ABPA which she does not have.  She also asked about the risk benefit of a trial of fluconazole.   I did recommend that there is no known or  studied benefit to treatment for these molds with prolonged antifungal treatments and that flunconazole does not have much activity for aspergillus.  Itraconazole does have some decent activity for aspergillus but not recommended.  She understands the risk of qT prolongation and is getting monitoring with an EKG.  I did counsel that typically fluconazole is well-tolerated though the known benefits do not outweigh the risks since there is no known benefit.  Plan: 1)  counseled on the lack of benefit in treatment of antibody tests without a clinical syndrome 2) follow up as needed.

## 2022-08-27 ENCOUNTER — Encounter (HOSPITAL_BASED_OUTPATIENT_CLINIC_OR_DEPARTMENT_OTHER): Payer: Self-pay | Admitting: Family

## 2022-08-27 ENCOUNTER — Encounter (HOSPITAL_BASED_OUTPATIENT_CLINIC_OR_DEPARTMENT_OTHER): Payer: Self-pay

## 2022-08-27 ENCOUNTER — Ambulatory Visit (INDEPENDENT_AMBULATORY_CARE_PROVIDER_SITE_OTHER): Payer: Medicare Other | Admitting: Family

## 2022-08-27 ENCOUNTER — Other Ambulatory Visit (INDEPENDENT_AMBULATORY_CARE_PROVIDER_SITE_OTHER): Payer: Medicare Other

## 2022-08-27 VITALS — BP 100/70 | HR 61

## 2022-08-27 DIAGNOSIS — I872 Venous insufficiency (chronic) (peripheral): Secondary | ICD-10-CM

## 2022-08-27 DIAGNOSIS — R079 Chest pain, unspecified: Secondary | ICD-10-CM

## 2022-08-27 DIAGNOSIS — R002 Palpitations: Secondary | ICD-10-CM | POA: Diagnosis not present

## 2022-08-27 DIAGNOSIS — I83813 Varicose veins of bilateral lower extremities with pain: Secondary | ICD-10-CM

## 2022-08-27 DIAGNOSIS — R072 Precordial pain: Secondary | ICD-10-CM

## 2022-08-27 MED ORDER — METOPROLOL TARTRATE 50 MG PO TABS: 50.0000 mg | ORAL_TABLET | Freq: Once | ORAL | 0 refills | Status: DC

## 2022-08-27 NOTE — Progress Notes (Signed)
Office Visit    Patient Name: Megan [name] Date of Encounter: 08/27/2022  PCP:  Charlane Ferretti, MD   [place]  Cardiologist:  Kirk Ruths, MD  Advanced Practice Provider:  No care team member to display Electrophysiologist:  None      Chief Complaint    Megan [name] is a 67 y.o. female presents today for palpitations, chest pain.    Past Medical History    Past Medical History:  Diagnosis Date   Allergic rhinitis    Allergy    Aneurysm of cardiac wall, congenital    a.  septal aneurysm extending into the RVOT (by cardiac MRI in 08/2013)   Anxiety    Aortic atherosclerosis (HCC)    Arthritis    Asthma    Basal cell carcinoma    Depression    Heart murmur    Hypothyroidism    on meds   Leukopenia    idiopathic   Lyme disease    Mitral valve prolapse    MVP (mitral valve prolapse)    Orthostatic hypotension    Osteoporosis    Pancreatic cyst    Peripheral neuropathy 06/09/2012   Pernicious anemia    Pernicious anemia    Pernicious anemia    Plantar fasciitis    Pneumothorax    Past Surgical History:  Procedure Laterality Date   COLONOSCOPY  2019   JMP-   COLONOSCOPY WITH PROPOFOL N/A 02/22/2013   Procedure: COLONOSCOPY WITH PROPOFOL;  Surgeon: Garlan Fair, MD;  Location: [facility];  Service: Endoscopy;  Laterality: N/A;   MOUTH SURGERY  01/2012   bone graft in mouth   NASAL SINUS SURGERY Right 06/12/2014   Procedure: RIGHT ENDOSCOPIC MAXILLARY ANTROSTOMY;  Surgeon: Ascencion Dike, MD;  Location: [facility];  Service: ENT;  Laterality: Right;   ROOT CANAL  02/27/2012   TONSILLECTOMY  1962   WISDOM TOOTH EXTRACTION      Allergies  Allergies  Allergen Reactions   Augmentin [Amoxicillin-Pot Clavulanate] Diarrhea   Clindamycin/Lincomycin Diarrhea   Iron     Other reaction(s): severe constipation   Moxifloxacin Other (See Comments)    neuropathy Other reaction(s): neuropathy   Sodium Citrate      Other Reaction(s): GI Upset (intolerance)    History of Present Illness    Megan [name] is a 67 y.o. female with a hx of aneurysm of membranous ventricular septum, thyroid disease, aortic atherosclerosis last seen 05/08/22 by Dr. Stanford Breed.  Prior MRA 08/2013 aneurysmal dilation of membranous ventricular septum extending into RV outflow tract with no sinus of valsalva aneurysm. Carotid duplex 03/2015 normal. Echo 12/2016 normal LVEF, mild MR. Stress echo 09/2017 normal. CTA 01/2018 aneurysm of membranous ventricular septum that bulges into RV outflow tract. No connection between LV and RV. No sinus of valsalva aneurysm. Calcium score 0. ABIs 03/2019 revealed noncompressible LLE artery and normal right. 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 Monitor 01/2020 due to palpitations SR, SB, ST with rare PAC/PVC and brief PAT. ABIs 09/2020 normal.   Last seen 05/08/22 noting occasional chest discomfort at rest or with activity that was atypical for angina not recommended for further workup.   She presents today for left sided chest pain that does not radiate. Now occurring daily lasting 30-90 seconds at rest or with activity. Tells me it feels like her heart is beating "off normal". Also  describes as sharp. Associated with mild shortness of breath but not tachycardia. Does wonder if it is related to COVID 03/2022 and 05/2022. She has been started on hormonal therapies and wants to ensure her heart is strong enough for this.   She is seeing integrative medicine doctor and was noted to have mold exposures. Recommended for antifungal for 6-12 months and wants to be sure her QTc is normal.   She wears a fit bit during spin class. Previous heart rate max 130s now up to the 180s.   She drinks 3 cups of weak coffee during the day and then 40 oz of water. Follows low sodium heart healthy diet.   She does note she gets a line  in her leg from her socks. Has had some discomfort in her calves which has been ongoing for a couple years. Most often at rest.   08/07/22 K 4.2, Magnesium 2.2.  09/30/21 labs: wbc 3.3, Hb 13.8, Plt 173, creatinine 0.66, GFR 96, K 4.4, TSH 1.01,   EKGs/Labs/Other Studies Reviewed:   The following studies were reviewed today:   EKG:  EKG is ordered today.  The ekg ordered today demonstrates sinus rhythm 61 bpm with rare PAC and no acute ST/T wave changes.   Recent Labs: 07/02/2022: ALT 12; BUN 18; Creatinine 0.61; Hemoglobin 13.5; Platelet Count 167; Potassium 4.0; Sodium 141  Recent Lipid Panel    Component Value Date/Time   CHOL 193 07/02/2022 0823   CHOL 168 11/22/2021 0824   TRIG 18 07/02/2022 0823   HDL 72 07/02/2022 0823   HDL 78 11/22/2021 0824   CHOLHDL 2.7 07/02/2022 0823   VLDL 4 07/02/2022 0823   LDLCALC 117 (H) 07/02/2022 0823   LDLCALC 82 11/22/2021 0824    Home Medications   No outpatient medications have been marked as taking for the 08/27/22 encounter (Office Visit) with Loel Dubonnet, NP.     Review of Systems      All other systems reviewed and are otherwise negative except as noted above.  Physical Exam    VS:  BP 100/70 (BP Location: Left Arm, Patient Position: Sitting, Cuff Size: Normal)   Pulse 61  , BMI There is no height or weight on file to calculate BMI.  Wt Readings from Last 3 Encounters:  08/13/22 130 lb (59 kg)  07/01/22 131 lb (59.4 kg)  06/11/22 129 lb 9.6 oz (58.8 kg)     GEN: Well nourished, well developed, in no acute distress. HEENT: normal. Neck: Supple, no JVD, carotid bruits, or masses. Cardiac: RRR, no murmurs, rubs, or gallops. No clubbing, cyanosis. Trace bilateral pretibial edema. .  Radials/PT 2+ and equal bilaterally.  Respiratory:  Respirations regular and unlabored, clear to auscultation bilaterally. GI: Soft, nontender, nondistended. MS: No deformity or atrophy. Skin: Warm and dry, no rash. Bilateral lower  extremities with varicose veins Neuro:  Strength and sensation are intact. Psych: Normal affect.  Assessment & Plan    Chest pain in adult - Left sided chest pain occurring daily lasting 60-90 seconds at rest or with activity. Prior CTA 2019 with coronary calcium score of 0. Given it has been a few years since last imaging and persistent worsening symptoms, update cardiac CTA.   Palpitations - Notes sensation of heart beating more forcefully. Also with heart rate escalating to 180s. Likely PVCs and PAT. EKG today NSR with occasional PAC. Plan for 14 day ZIO monitor. If frequent arrhythmias, could consider beta blocker.   Inactivity - Refer to  PREP exercise program at [facility].  Venous insufficiency - Bilateral LE with varicose veins and trace pretibial edema. Discussed compression stockings, leg elevation. She does have some painful varicose veins and will refer to [facility] for management.        Disposition: Follow up in 6 week(s) with Kirk Ruths, MD or APP.  Signed, Loel Dubonnet, NP 08/27/2022, 2:48 PM [place]

## 2022-08-27 NOTE — Patient Instructions (Signed)
Medication Instructions:  Your Physician recommend you continue on your current medication as directed.    *If you need a refill on your cardiac medications before your next appointment, please call your pharmacy*  Testing/Procedures: Your physician has recommended that you wear a Zio monitor.   This monitor is a medical device that records the heart's electrical activity. Doctors most often use these monitors to diagnose arrhythmias. Arrhythmias are problems with the speed or rhythm of the heartbeat. The monitor is a small device applied to your chest. You can wear one while you do your normal daily activities. While wearing this monitor if you have any symptoms to push the button and record what you felt. Once you have worn this monitor for the period of time provider prescribed (Usually 14 days), you will return the monitor device in the postage paid box. Once it is returned they will download the data collected and provide [place] with a report which the provider will then review and we will call you with those results. Important tips:  Avoid showering during the first 24 hours of wearing the monitor. Avoid excessive sweating to help maximize wear time. Do not submerge the device, no hot tubs, and no swimming pools. Keep any lotions or oils away from the patch. After 24 hours you may shower with the patch on. Take brief showers with your back facing the shower head.  Do not remove patch once it has been placed because that will interrupt data and decrease adhesive wear time. Push the button when you have any symptoms and write down what you were feeling. Once you have completed wearing your monitor, remove and place into box which has postage paid and place in your outgoing mailbox.  If for some reason you have misplaced your box then call our office and we can provide another box and/or mail it off for you.      Your cardiac CT will be scheduled at one of the below locations:   [facility] [address] [place], [place] [address] [phone]  If scheduled at [facility], please arrive at the [facility] and Children's Entrance (Entrance C2) of [address] 30 minutes prior to test start time. You can use the FREE valet parking offered at entrance C (encouraged to control the heart rate for the test)  Proceed to the [facility] Radiology Department (first floor) to check-in and test prep.  All radiology patients and guests should use entrance C2 at [facility], accessed from [facility], even though the hospital's physical address listed is [address].    Please follow these instructions carefully (unless otherwise directed):  On the Night Before the Test: Be sure to Drink plenty of water. Do not consume any caffeinated/decaffeinated beverages or chocolate 12 hours prior to your test. Do not take any antihistamines 12 hours prior to your test.  On the Day of the Test: Drink plenty of water until 1 hour prior to the test. Do not eat any food 1 hour prior to test. You may take your regular medications prior to the test.  Take metoprolol (Lopressor) '50mg'$  two hours prior to test. FEMALES- please wear underwire-free bra if available, avoid dresses & tight clothing  After the Test: Drink plenty of water. After receiving IV contrast, you may experience a mild flushed feeling. This is normal. On occasion, you may experience a mild rash up to 24 hours after the test. This is not dangerous. If  this occurs, you can take Benadryl 25 mg and increase your fluid intake. If you experience trouble breathing, this can be serious. If it is severe call 911 IMMEDIATELY. If it is mild, please call our office. If you take any of these medications: Glipizide/Metformin, Avandament, Glucavance, please do not take 48 hours after completing test unless otherwise instructed.  We will call to schedule your test 2-4 weeks out understanding  that some insurance companies will need an authorization prior to the service being performed.   For non-scheduling related questions, please contact the cardiac imaging nurse navigator should you have any questions/concerns: Marchia Bond, Cardiac Imaging Nurse Navigator Gordy Clement, Cardiac Imaging Nurse Navigator [facility] Direct Office Dial: [phone]   For scheduling needs, including cancellations and rescheduling, please call [place], [phone].    Follow-Up: At [facility], you and your health needs are our priority.  As part of our continuing mission to provide you with exceptional heart care, we have created designated Provider Care Teams.  These Care Teams include your primary Cardiologist (physician) and Advanced Practice Providers (APPs -  Physician Assistants and Nurse Practitioners) who all work together to provide you with the care you need, when you need it.  We recommend signing up for the patient portal called "MyChart".  Sign up information is provided on this After Visit Summary.  MyChart is used to connect with patients for Virtual Visits (Telemedicine).  Patients are able to view lab/test results, encounter notes, upcoming appointments, etc.  Non-urgent messages can be sent to your provider as well.   To learn more about what you can do with MyChart, go to [email or web address].    Your next appointment:   6 week(s)  The format for your next appointment:   In Person  Provider:   Kirk Ruths, MD  or Laurann Montana, NP    Other Instructions To prevent or reduce lower extremity swelling: Eat a low salt diet. Salt makes the body hold onto extra fluid which causes swelling. Sit with legs elevated. For example, in the recliner or on an [place].  Wear knee-high compression stockings during the daytime. Ones labeled 15-20 mmHg provide good compression. Can get ones with a zipper.  To prevent palpitations: Make sure  you are adequately hydrated.  Avoid and/or limit caffeine containing beverages like soda or tea. Exercise regularly.  Manage stress well. Some over the counter medications can cause palpitations such as Benadryl, AdvilPM, TylenolPM. Regular Advil or Tylenol do not cause palpitations.       Chronic Venous Insufficiency Chronic venous insufficiency is a condition where the leg veins cannot effectively pump blood from the legs to the heart. This happens when the vein walls are either stretched, weakened, or damaged, or when the valves inside the vein are damaged. With the right treatment, you should be able to continue with an active life. This condition is also called venous stasis. What are the causes? Common causes of this condition include: High blood pressure inside the veins (venous hypertension). Sitting or standing too long, causing increased blood pressure in the leg veins. A blood clot that blocks blood flow in a vein (deep vein thrombosis, DVT). Inflammation of a vein (phlebitis) that causes a blood clot to form. Tumors in the pelvis that cause blood to back up. What increases the risk? The following factors may make you more likely to develop this condition: Having a family history of this condition. Obesity. Pregnancy. Living without enough regular physical activity or  exercise (sedentary lifestyle). Smoking. Having a job that requires long periods of standing or sitting in one place. Being a certain age. Women in their 11s and 20s and men in their 73s are more likely to develop this condition. What are the signs or symptoms? Symptoms of this condition include: Veins that are enlarged, bulging, or twisted (varicose veins). Skin breakdown or ulcers. Reddened skin or dark discoloration of skin on the leg between the knee and ankle. Brown, smooth, tight, and painful skin just above the ankle, usually on the inside of the leg (lipodermatosclerosis). Swelling of the legs. How is  this diagnosed? This condition may be diagnosed based on: Your medical history. A physical exam. Tests, such as: A procedure that creates an image of a blood vessel and nearby organs and provides information about blood flow through the blood vessel (duplex ultrasound). A procedure that tests blood flow (plethysmography). A procedure that looks at the veins using X-ray and dye (venogram). How is this treated? The goals of treatment are to help you return to an active life and to minimize pain or disability. Treatment depends on the severity of your condition, and it may include: Wearing compression stockings. These can help relieve symptoms and help prevent your condition from getting worse. However, they do not cure the condition. Sclerotherapy. This procedure involves an injection of a solution that shrinks damaged veins. Surgery. This may involve: Removing a diseased vein (vein stripping). Cutting off blood flow through the vein (laser ablation surgery). Repairing or reconstructing a valve within the affected vein. Follow these instructions at home:     Wear compression stockings as told by your health care provider. These stockings help to prevent blood clots and reduce swelling in your legs. Take over-the-counter and prescription medicines only as told by your health care provider. Stay active by exercising, walking, or doing different activities. Ask your health care provider what activities are safe for you and how much exercise you need. Drink enough fluid to keep your urine pale yellow. Do not use any products that contain nicotine or tobacco, such as cigarettes, e-cigarettes, and chewing tobacco. If you need help quitting, ask your health care provider. Keep all follow-up visits as told by your health care provider. This is important. Contact a health care provider if you: Have redness, swelling, or more pain in the affected area. See a red streak or line that goes up or down  from the affected area. Have skin breakdown or skin loss in the affected area, even if the breakdown is small. Get an injury in the affected area. Get help right away if: You get an injury and an open wound in the affected area. You have: Severe pain that does not get better with medicine. Sudden numbness or weakness in the foot or ankle below the affected area. Trouble moving your foot or ankle. A fever. Worse or persistent symptoms. Chest pain. Shortness of breath. Summary Chronic venous insufficiency is a condition where the leg veins cannot effectively pump blood from the legs to the heart. Chronic venous insufficiency occurs when the vein walls become stretched, weakened, or damaged, or when valves within the vein are damaged. Treatment depends on how severe your condition is. It often involves wearing compression stockings and may involve having a procedure. Make sure you stay active by exercising, walking, or doing different activities. Ask your health care provider what activities are safe for you and how much exercise you need. This information is not intended to replace advice  given to you by your health care provider. Make sure you discuss any questions you have with your health care provider. Document Revised: 10/23/2020 Document Reviewed: 10/23/2020 Elsevier Patient Education  [place].

## 2022-08-28 ENCOUNTER — Encounter (HOSPITAL_BASED_OUTPATIENT_CLINIC_OR_DEPARTMENT_OTHER): Payer: Self-pay

## 2022-08-28 NOTE — Telephone Encounter (Signed)
Please advise 

## 2022-08-29 ENCOUNTER — Telehealth: Payer: Self-pay

## 2022-08-29 NOTE — Telephone Encounter (Signed)
Called to discuss PREP program referral, left voicemail  

## 2022-09-01 ENCOUNTER — Other Ambulatory Visit: Payer: Self-pay | Admitting: *Deleted

## 2022-09-01 DIAGNOSIS — I83811 Varicose veins of right lower extremities with pain: Secondary | ICD-10-CM

## 2022-09-01 DIAGNOSIS — M79675 Pain in left toe(s): Secondary | ICD-10-CM

## 2022-09-10 ENCOUNTER — Telehealth (HOSPITAL_COMMUNITY): Payer: Self-pay | Admitting: Emergency Medicine

## 2022-09-10 NOTE — Telephone Encounter (Signed)
Reaching out to patient to offer assistance regarding upcoming cardiac imaging study; pt verbalizes understanding of appt date/time, parking situation and where to check in, pre-test NPO status and medications ordered, and verified current allergies; name and call back number provided for further questions should they arise Marchia Bond RN Navigator Cardiac Imaging [facility] [phone] office [phone] cell   Arrival [address] [place] entrance Denies iv issues Aware contrast / nitro HR 61 BP 100/70 ( will not take one time dose metop)

## 2022-09-11 ENCOUNTER — Ambulatory Visit (HOSPITAL_COMMUNITY): Payer: Medicare Other

## 2022-09-12 ENCOUNTER — Ambulatory Visit (HOSPITAL_COMMUNITY): Payer: Medicare Other

## 2022-09-16 ENCOUNTER — Ambulatory Visit (INDEPENDENT_AMBULATORY_CARE_PROVIDER_SITE_OTHER): Payer: Medicare Other | Admitting: Physician Assistant

## 2022-09-16 ENCOUNTER — Ambulatory Visit (HOSPITAL_COMMUNITY)
Admission: RE | Admit: 2022-09-16 | Discharge: 2022-09-16 | Disposition: A | Discharge status: Home / Self Care | Payer: Medicare Other | Source: Ambulatory Visit | Attending: Vascular Surgery | Admitting: Vascular Surgery

## 2022-09-16 VITALS — BP 94/55 | HR 73 | Temp 98.5°F | Resp 20 | Ht 69.0 in | Wt 130.0 lb

## 2022-09-16 DIAGNOSIS — I872 Venous insufficiency (chronic) (peripheral): Secondary | ICD-10-CM

## 2022-09-16 DIAGNOSIS — I83811 Varicose veins of right lower extremities with pain: Secondary | ICD-10-CM

## 2022-09-16 NOTE — Progress Notes (Signed)
Office Note     CC:  follow up Requesting Provider:  Loel Dubonnet, NP  HPI: Megan [name] is a 67 y.o. ([birth year]) female who presents for follow up evaluation of lower extremity swelling. She has previously been evaluated for both arterial and venous disease by Dr. Oneida Alar. Previous duplex scan showed mild reflux in the mid left greater saphenous vein and also in the right lesser saphenous vein but the vein diameter was only 2 mm so a laser ablation was not performed. Her prior ABI's have been completely normal.   Today she reports continues swelling in both lower extremities. She gets visible lines from her socks on both legs. She also complains of pain in her legs and purple discoloration of her feet/toes. She does have some spider veins and reticular veins as well, rarely has any symptoms from these. The swelling in her legs is worse on prolonged standing and ambulation. Her aching pains occur more so at night time when she is in bed. She notices her discoloration of  her toes and feet mostly when they are in a dependent position. She does potentially have some neuropathy in her feet although reports normal nerve conduction tests. She does not elevate or wear compression stockings. She is very active. Exercises regularly cycling, walking, and doing yoga. She does have family history of venous disease in her mother. She explains that her mom had a lot of her veins stripped in her 20's. She has no family history of DVT.  From her explanation it sounds like her father had some arterial disease.   Past Medical History:  Diagnosis Date   Allergic rhinitis    Allergy    Aneurysm of cardiac wall, congenital    a.  septal aneurysm extending into the RVOT (by cardiac MRI in 08/2013)   Anxiety    Aortic atherosclerosis (HCC)    Arthritis    Asthma    Basal cell carcinoma    Depression    Heart murmur    Hypothyroidism    on meds   Leukopenia    idiopathic   Lyme disease    Mitral valve  prolapse    MVP (mitral valve prolapse)    Orthostatic hypotension    Osteoporosis    Pancreatic cyst    Peripheral neuropathy 06/09/2012   Pernicious anemia    Pernicious anemia    Pernicious anemia    Plantar fasciitis    Pneumothorax     Past Surgical History:  Procedure Laterality Date   COLONOSCOPY  2019   JMP-   COLONOSCOPY WITH PROPOFOL N/A 02/22/2013   Procedure: COLONOSCOPY WITH PROPOFOL;  Surgeon: Garlan Fair, MD;  Location: [facility];  Service: Endoscopy;  Laterality: N/A;   MOUTH SURGERY  01/2012   bone graft in mouth   NASAL SINUS SURGERY Right 06/12/2014   Procedure: RIGHT ENDOSCOPIC MAXILLARY ANTROSTOMY;  Surgeon: Ascencion Dike, MD;  Location: [facility];  Service: ENT;  Laterality: Right;   ROOT CANAL  02/27/2012   TONSILLECTOMY  1962   WISDOM TOOTH EXTRACTION      Social History   Socioeconomic History   Marital status: Divorced    Spouse name: Not on file   Number of children: 0   Years of education: Not on file   Highest education level: Not on file  Occupational History    Comment: Career and life coach  Tobacco Use   Smoking status: Never   Smokeless tobacco: Never  Vaping  Use   Vaping Use: Never used  Substance and Sexual Activity   Alcohol use: No    Alcohol/week: 0.0 standard drinks of alcohol   Drug use: No   Sexual activity: Not Currently  Other Topics Concern   Not on file  Social History Narrative   Patient is single and lives alone.   Patient is self-employed, career and life coaching.   Patient drinks two to four cups of caffeine daily.   Right handed    Social Determinants of Health   Financial Resource Strain: Not on file  Food Insecurity: Not on file  Transportation Needs: Not on file  Physical Activity: Not on file  Stress: Not on file  Social Connections: Not on file  Intimate Partner Violence: Not on file    Family History  Problem Relation Age of Onset   Hyperlipidemia Father    Heart failure  Father    Prostate cancer Father 64   Kidney failure Father    Hypertension Father    Colon polyps Father    Angina Mother    Hypertension Mother    Lung cancer Mother 43       again at 32/79-smoker   Colon cancer Mother 49   Colon polyps Mother    Lung cancer Paternal Grandfather    Breast cancer Paternal Grandmother    Diabetes Paternal Grandmother    Esophageal cancer Neg Hx    Liver cancer Neg Hx    Pancreatic cancer Neg Hx    Rectal cancer Neg Hx    Stomach cancer Neg Hx     Current Outpatient Medications  Medication Sig Dispense Refill   5-Hydroxytryptophan (5-HTP) 100 MG CAPS Take 100 mg by mouth daily.      albuterol (VENTOLIN HFA) 108 (90 Base) MCG/ACT inhaler Inhale 1 puff into the lungs daily as needed for wheezing or shortness of breath.      ARMOUR THYROID 30 MG tablet Take 1 tablet (30 mg total) by mouth daily. 90 tablet 3   Ascorbic Acid (VITAMIN C) POWD Take 2,500 mg by mouth daily.     Cholecalciferol (VITAMIN D3) 10000 units capsule Take 10,000 Units by mouth daily.      Cyanocobalamin (VITAMIN B-12 ER PO) Take 5,000 mcg by mouth daily.      estradiol (ESTRACE) 0.1 MG/GM vaginal cream Place 1 Applicatorful vaginally 3 (three) times a week.     Fluticasone-Salmeterol 113-14 MCG/ACT AEPB Inhale 1 puff into the lungs in the morning and at bedtime. 1 each 11   LORazepam (ATIVAN) 0.5 MG tablet Take 0.25-0.5 mg by mouth daily as needed.     MAGNESIUM CITRATE PO Take 600 mg by mouth daily.     Menaquinone-7 (VITAMIN K2 PO) Take 1 tablet by mouth daily.     NON FORMULARY Take 1,000 mcg by mouth daily. Methyl Folate chewable tablet     progesterone (PROMETRIUM) 100 MG capsule Take 0.5 capsules by mouth daily.     metoprolol tartrate (LOPRESSOR) 50 MG tablet Take 1 tablet (50 mg total) by mouth once for 1 dose. Take 1 tablet 2 hours prior to Cardiac CT 1 tablet 0   No current facility-administered medications for this visit.    Allergies  Allergen Reactions    Augmentin [Amoxicillin-Pot Clavulanate] Diarrhea   Clindamycin/Lincomycin Diarrhea   Iron     Other reaction(s): severe constipation   Moxifloxacin Other (See Comments)    neuropathy Other reaction(s): neuropathy   Sodium Citrate     Other  Reaction(s): GI Upset (intolerance)     REVIEW OF SYSTEMS:  '[X]'$  denotes positive finding, '[ ]'$  denotes negative finding Cardiac  Comments:  Chest pain or chest pressure:    Shortness of breath upon exertion:    Short of breath when lying flat:    Irregular heart rhythm:        Vascular    Pain in calf, thigh, or hip brought on by ambulation:    Pain in feet at night that wakes you up from your sleep:     Blood clot in your veins:    Leg swelling:  X       Pulmonary    Oxygen at home:    Productive cough:     Wheezing:         Neurologic    Sudden weakness in arms or legs:     Sudden numbness in arms or legs:     Sudden onset of difficulty speaking or slurred speech:    Temporary loss of vision in one eye:     Problems with dizziness:         Gastrointestinal    Blood in stool:     Vomited blood:         Genitourinary    Burning when urinating:     Blood in urine:        Psychiatric    Major depression:         Hematologic    Bleeding problems:    Problems with blood clotting too easily:        Skin    Rashes or ulcers:        Constitutional    Fever or chills:      PHYSICAL EXAMINATION:  Vitals:   09/16/22 1435  BP: (!) 94/55  Pulse: 73  Resp: 20  Temp: 98.5 F (36.9 C)  SpO2: 100%  Weight: 130 lb (59 kg)  Height: '5\' 9"'$  (1.753 m)    General:  WDWN in NAD; vital signs documented above Gait: Normal HENT: WNL, normocephalic Pulmonary: normal non-labored breathing , without wheezing Cardiac: regular HR Vascular Exam/Pulses:2+ popliteal, DP and PT pulses bilaterally. Feet warm and well perfused Extremities: without ischemic changes, without Gangrene , without cellulitis; without open wounds. She does have  bilateral lower extremity edema. Some purplish discoloration of her toes on dependency. Musculoskeletal: no muscle wasting or atrophy  Neurologic: A&O X 3;  No focal weakness or paresthesias are detected Psychiatric:  The pt has Normal affect.   Non invasive vascular lab follow up: RLE:  No DVT and SVT No GSV reflux  GSV diameter 0.12-0.30 cm  No SSV reflux FV deep venous reflux AASV with some reflux   ASSESSMENT/PLAN:: 67 y.o. female here for follow up for venous insufficiency. Her duplex again today shows minimal venous reflux. She has no DVT or SVT. No GSV reflux. She has a small amount in her FV and AASV. No SSV reflux. Her veins are also very small. For these reasons she is not a candidate for any invasive venous intervention. I discussed with her the nature of venous disease and based on her studies I feel she will really benefit with daily proper elevation over her legs, knee high 15-20 mmHg of compression stockings, continued exercise, and refraining from prolonged sitting or standing. I did discuss option for sclerotherapy of some of her reticular veins. I explained this was a cosmetic intervention and would be out of pocket. She is not interested in this  at this time. She was measured for knee high compression stockings and provided pamphlet for [employer]. She would like to obtain some stockings without the toes. She otherwise will follow up with [place] as needed if she has any new or worsening symptoms.   Karoline Caldwell, PA-C [facility] [phone]  Clinic MD:   Carlis Abbott

## 2022-09-17 NOTE — Progress Notes (Signed)
Synopsis: Referred for dyspnea by Lavone Orn, MD  Subjective:   PATIENT ID: Megan [name] GENDER: female DOB: 10-10-1955, MRN: [id number]  Chief Complaint  Patient presents with   Follow-up   67yF with history of AR, asthma, hypothyroid, atrial septal aneurysm extending into RVOT, MVP, pneumothorax, idiopathic neutropenia, never smoker referred for dyspnea.  In 2009 she had a pneumonia and atelectasis and required bronchoscopy.  She says that she was just started on evenity. Before this she had some work done in her crawlspace and with some brown dust it was cleaned, fogged. Smelled something relatead to this for a while. She had pleuritic CP after first dose which resolved after first day. In ED she had CXR which said interval diffuse interstitial opacities. She did feels some DOE when walking more than a healthy peer.   She is very sensitive to smells, perfumes, etc. She'll feel dyspneic.    She worked in [employer] fundraiser, now does Museum/gallery conservator. No frequent exposure to dusts/solvents without a mask other than episode described above.  Interval HPI:   PFTs stable, normal  Has been on fluconazole for a month based on some blood tests that her integrative medicine specialist ordered. Then did 5 day course of ivermectin for post-covid/long covid burning mouth sensation.   Otherwise pertinent review of systems is negative.  Past Medical History:  Diagnosis Date   Allergic rhinitis    Allergy    Aneurysm of cardiac wall, congenital    a.  septal aneurysm extending into the RVOT (by cardiac MRI in 08/2013)   Anxiety    Aortic atherosclerosis (HCC)    Arthritis    Asthma    Basal cell carcinoma    Depression    Heart murmur    Hypothyroidism    on meds   Leukopenia    idiopathic   Lyme disease    Mitral valve prolapse    MVP (mitral valve prolapse)    Orthostatic hypotension    Osteoporosis    Pancreatic cyst    Peripheral neuropathy 06/09/2012   Pernicious  anemia    Pernicious anemia    Pernicious anemia    Plantar fasciitis    Pneumothorax      Family History  Problem Relation Age of Onset   Hyperlipidemia Father    Heart failure Father    Prostate cancer Father 37   Kidney failure Father    Hypertension Father    Colon polyps Father    Angina Mother    Hypertension Mother    Lung cancer Mother 56       again at 79/79-smoker   Colon cancer Mother 15   Colon polyps Mother    Lung cancer Paternal Grandfather    Breast cancer Paternal Grandmother    Diabetes Paternal Grandmother    Esophageal cancer Neg Hx    Liver cancer Neg Hx    Pancreatic cancer Neg Hx    Rectal cancer Neg Hx    Stomach cancer Neg Hx      Past Surgical History:  Procedure Laterality Date   COLONOSCOPY  2019   JMP-   COLONOSCOPY WITH PROPOFOL N/A 02/22/2013   Procedure: COLONOSCOPY WITH PROPOFOL;  Surgeon: Garlan Fair, MD;  Location: [facility];  Service: Endoscopy;  Laterality: N/A;   MOUTH SURGERY  01/2012   bone graft in mouth   NASAL SINUS SURGERY Right 06/12/2014   Procedure: RIGHT ENDOSCOPIC MAXILLARY ANTROSTOMY;  Surgeon: Ascencion Dike, MD;  Location: [facility];  Service: ENT;  Laterality: Right;   ROOT CANAL  02/27/2012   TONSILLECTOMY  1962   WISDOM TOOTH EXTRACTION      Social History   Socioeconomic History   Marital status: Divorced    Spouse name: Not on file   Number of children: 0   Years of education: Not on file   Highest education level: Not on file  Occupational History    Comment: Career and life coach  Tobacco Use   Smoking status: Never   Smokeless tobacco: Never  Vaping Use   Vaping Use: Never used  Substance and Sexual Activity   Alcohol use: No    Alcohol/week: 0.0 standard drinks of alcohol   Drug use: No   Sexual activity: Not Currently  Other Topics Concern   Not on file  Social History Narrative   Patient is single and lives alone.   Patient is self-employed, career and life coaching.    Patient drinks two to four cups of caffeine daily.   Right handed    Social Determinants of Health   Financial Resource Strain: Not on file  Food Insecurity: Not on file  Transportation Needs: Not on file  Physical Activity: Not on file  Stress: Not on file  Social Connections: Not on file  Intimate Partner Violence: Not on file     Allergies  Allergen Reactions   Augmentin [Amoxicillin-Pot Clavulanate] Diarrhea   Clindamycin/Lincomycin Diarrhea   Iron     Other reaction(s): severe constipation   Moxifloxacin Other (See Comments)    neuropathy Other reaction(s): neuropathy   Sodium Citrate     Other Reaction(s): GI Upset (intolerance)     Outpatient Medications Prior to Visit  Medication Sig Dispense Refill   5-Hydroxytryptophan (5-HTP) 100 MG CAPS Take 100 mg by mouth daily.      albuterol (VENTOLIN HFA) 108 (90 Base) MCG/ACT inhaler Inhale 1 puff into the lungs daily as needed for wheezing or shortness of breath.      ARMOUR THYROID 30 MG tablet Take 1 tablet (30 mg total) by mouth daily. 90 tablet 3   Ascorbic Acid (VITAMIN C) POWD Take 2,500 mg by mouth daily.     Cholecalciferol (VITAMIN D3) 10000 units capsule Take 10,000 Units by mouth daily.      Cyanocobalamin (VITAMIN B-12 ER PO) Take 5,000 mcg by mouth daily.      estradiol (ESTRACE) 0.1 MG/GM vaginal cream Place 1 Applicatorful vaginally 3 (three) times a week.     fluconazole (DIFLUCAN) 50 MG tablet Take 50 mg by mouth daily.     Fluticasone-Salmeterol 113-14 MCG/ACT AEPB Inhale 1 puff into the lungs in the morning and at bedtime. 1 each 11   ivermectin (STROMECTOL) 3 MG TABS tablet Take 12 mg by mouth daily.     LORazepam (ATIVAN) 0.5 MG tablet Take 0.25-0.5 mg by mouth daily as needed.     MAGNESIUM CITRATE PO Take 600 mg by mouth daily.     Menaquinone-7 (VITAMIN K2 PO) Take 1 tablet by mouth daily.     metoprolol succinate (TOPROL-XL) 25 MG 24 hr tablet Take 1 tablet (25 mg total) by mouth daily. Take  with or immediately following a meal. 90 tablet 3   NON FORMULARY Take 1,000 mcg by mouth daily. Methyl Folate chewable tablet     progesterone (PROMETRIUM) 100 MG capsule Take 0.5 capsules by mouth daily.     metoprolol tartrate (LOPRESSOR) 50 MG tablet Take 1 tablet (50 mg total)  by mouth once for 1 dose. Take 1 tablet 2 hours prior to Cardiac CT 1 tablet 0   No facility-administered medications prior to visit.       Objective:   Physical Exam:  General appearance: 67 y.o., female, NAD, conversant  Eyes: anicteric sclerae, moist conjunctivae; no lid-lag; PERRL, tracking appropriately HENT: NCAT; oropharynx, MMM, no mucosal ulcerations; normal hard and soft palate Neck: Trachea midline; no lymphadenopathy, no JVD Lungs: CTAB, no wheeze, with normal respiratory effort CV: RRR, no MRGs  Abdomen: Soft, non-tender; non-distended, BS present  Extremities: No peripheral edema, radial and DP pulses present bilaterally  Skin: Normal temperature, turgor and texture; no rash Psych: Appropriate affect Neuro: Alert and oriented to person and place, no focal deficit    Vitals:   09/19/22 1411  BP: 122/74  Pulse: 67  SpO2: 97%  Weight: 132 lb 3.2 oz (60 kg)  Height: '5\' 9"'$  (1.753 m)      97% on RA BMI Readings from Last 3 Encounters:  09/19/22 19.52 kg/m  09/16/22 19.20 kg/m  08/13/22 19.06 kg/m   Wt Readings from Last 3 Encounters:  09/19/22 132 lb 3.2 oz (60 kg)  09/16/22 130 lb (59 kg)  08/13/22 130 lb (59 kg)     CBC    Component Value Date/Time   WBC 3.0 (L) 07/02/2022 0817   WBC 3.8 (L) 04/11/2022 2029   RBC 4.26 07/02/2022 0817   HGB 13.5 07/02/2022 0817   HGB 13.7 03/15/2013 1144   HCT 40.3 07/02/2022 0817   HCT 40.8 03/15/2013 1144   PLT 167 07/02/2022 0817   PLT 210 03/15/2013 1144   MCV 94.6 07/02/2022 0817   MCV 88.9 03/15/2013 1144   MCH 31.7 07/02/2022 0817   MCHC 33.5 07/02/2022 0817   RDW 13.1 07/02/2022 0817   RDW 13.8 03/15/2013 1144    LYMPHSABS 0.8 07/02/2022 0817   LYMPHSABS 1.0 03/15/2013 1144   MONOABS 0.3 07/02/2022 0817   MONOABS 0.3 03/15/2013 1144   EOSABS 0.1 07/02/2022 0817   EOSABS 0.0 03/15/2013 1144   BASOSABS 0.0 07/02/2022 0817   BASOSABS 0.1 03/15/2013 1144    No remarkable eosinophilia  Chest Imaging: HRCT Chest 04/2021 reviewed by me and remarkable for subtle centrilobular nodules, TIB, definite mosaicism on expiratory imaging  CTA Chest 04/11/22 reviewed by me - stable  Pulmonary Functions Testing Results:    Latest Ref Rng & Units 09/19/2022   12:51 PM  PFT Results  FVC-Pre L 3.88  P  FVC-Predicted Pre % 104  P  FVC-Post L 3.91  P  FVC-Predicted Post % 104  P  Pre FEV1/FVC % % 73  P  Post FEV1/FCV % % 75  P  FEV1-Pre L 2.85  P  FEV1-Predicted Pre % 100  P  FEV1-Post L 2.93  P    P Preliminary result   PFT today reviewed by me normal, stable relative to prior     Echocardiogram:   12/2016: - Normal LV systolic and diastolic function; 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      Assessment & Plan:    # Centrilobular nodules, mosaicism: irritant asthma or bronchiolitis obliterans with inhalation of chemical irritant vs hypersensitivity pneumonitis (mold exposure and disruption at home). Covid-19 related small airways disease or simply post viral cough/bronchospasm may also being playing role. Doing better at this visit regardless and lung function is stable.  #  Chest burning sensation  # Multiple positive fungal IgG serologies Ordered by integrative medicine specialist. He has recommended prolonged course of fluconazole, itraconazole  # Oropharyngeal burning, attributed to long covid Given ivermectin 5d course by integrative medicine specialist.   Plan: - albuterol 1-2 puffs as needed if sudden trouble breathing, persistent cough. If unimproved then try your fluticasone-salmeterol inhaler 1 puff twice daily.  Would need to rinse your mouth and brush your tongue/teeth after use. Call and make appointment with [place] if you need to do this.  - discussed lifestyle measures for reflux, prn use of famotidine - We talked about how she has no form of mold related chronic pulmonary disease that would warrant treatment with an antifungal medication. WRT to ABPA she has normal IgE, no clear solid evidence of asthma, eos are only 0-100, and she has no bronchiectasis or mucus plugging. There are no cavitary nodules/masses suggestive of invasive mold infection. She is not the right host for aspergillus tracheobronchitis. She has also seen Dr. Benjamine Mola recently and is not felt to have any fungal sinusitis. To me risks of itraconazole would far outweigh any potential benefit.  - We also talked about ivermectin for long covid. To my knowledge there is no good quality data to support its use beyond treatment of parasitic infection. One recent RCT raises question of whether metformin may alternatively be good option and is probably safer to use anyway. For now I'd wait and see if this is a finding confirmed in other trials.  - see you in a few months or sooner if need be!  I spent 47 minutes dedicated to the care of this patient on the date of this encounter to include pre-visit review of records, face-to-face time with the patient discussing conditions above, post visit ordering of testing, clinical documentation with the electronic health record, making appropriate referrals as documented, and communicating necessary findings to members of the patients care team.     Maryjane Hurter, MD [facility] 09/19/2022 2:15 PM

## 2022-09-18 ENCOUNTER — Telehealth (HOSPITAL_BASED_OUTPATIENT_CLINIC_OR_DEPARTMENT_OTHER): Payer: Self-pay

## 2022-09-18 MED ORDER — METOPROLOL SUCCINATE ER 25 MG PO TB24: 25.0000 mg | ORAL_TABLET | Freq: Every day | ORAL | 3 refills | Status: DC

## 2022-09-18 NOTE — Telephone Encounter (Addendum)
Seen by patient Megan [name] on 09/18/2022  8:36 AM; Rx to pharm and follow up mychart message to patient.   ----- Message from Loel Dubonnet, NP sent at 09/18/2022  8:35 AM EST ----- Monitor with predominantly normal sinus rhythm with average heart rate of 70 bpm.  Occasional fast heartbeat in the top or bottom chamber of the heart.  Early beats in the top chamber of the heart 6% of the time.  Triggered episodes were often associated with these early beats.  They are not dangerous but can cause annoying symptoms.  Recommend addition of metoprolol succinate 25 mg daily.

## 2022-09-19 ENCOUNTER — Encounter: Payer: Self-pay | Admitting: Student

## 2022-09-19 ENCOUNTER — Ambulatory Visit (INDEPENDENT_AMBULATORY_CARE_PROVIDER_SITE_OTHER): Payer: Medicare Other | Admitting: Student

## 2022-09-19 VITALS — BP 122/74 | HR 67 | Ht 69.0 in | Wt 132.2 lb

## 2022-09-19 DIAGNOSIS — J219 Acute bronchiolitis, unspecified: Secondary | ICD-10-CM

## 2022-09-19 DIAGNOSIS — R0789 Other chest pain: Secondary | ICD-10-CM

## 2022-09-19 LAB — PULMONARY FUNCTION TEST
FEF 25-75 Post: 2.38 L/sec
FEF 25-75 Pre: 2.14 L/sec
FEF2575-%Change-Post: 11 %
FEF2575-%Pred-Post: 101 %
FEF2575-%Pred-Pre: 91 %
FEV1-%Change-Post: 2 %
FEV1-%Pred-Post: 102 %
FEV1-%Pred-Pre: 100 %
FEV1-Post: 2.93 L
FEV1-Pre: 2.85 L
FEV1FVC-%Change-Post: 1 %
FEV1FVC-%Pred-Pre: 95 %
FEV6-%Change-Post: 0 %
FEV6-%Pred-Post: 109 %
FEV6-%Pred-Pre: 108 %
FEV6-Post: 3.91 L
FEV6-Pre: 3.88 L
FEV6FVC-%Change-Post: 0 %
FEV6FVC-%Pred-Post: 104 %
FEV6FVC-%Pred-Pre: 104 %
FVC-%Change-Post: 0 %
FVC-%Pred-Post: 104 %
FVC-%Pred-Pre: 104 %
FVC-Post: 3.91 L
FVC-Pre: 3.88 L
Post FEV1/FVC ratio: 75 %
Post FEV6/FVC ratio: 100 %
Pre FEV1/FVC ratio: 73 %
Pre FEV6/FVC Ratio: 100 %

## 2022-09-19 LAB — LIPID PANEL
Chol/HDL Ratio: 2.3 ratio (ref 0.0–4.4)
Cholesterol, Total: 178 mg/dL (ref 100–199)
HDL: 78 mg/dL (ref >39)
LDL Chol Calc (NIH): 91 mg/dL (ref 0–99)
Triglycerides: 41 mg/dL (ref 0–149)
VLDL Cholesterol Cal: 9 mg/dL (ref 5–40)

## 2022-09-19 NOTE — Patient Instructions (Addendum)
- albuterol 1-2 puffs as needed if sudden trouble breathing, persistent cough. If unimproved then try your fluticasone-salmeterol inhaler 1 puff twice daily. Would need to rinse your mouth and brush your tongue/teeth after use. Call and make appointment with [place] if you need to do this.  - see instructions below for lifestyle measures for reflux. Pepcid (famotidine) would be fine to use on intermittent basis if your chest burning doesn't resolve with lifestyle measures. Try sleeping on your right side.  - We'll see you in June or sooner if need be!  Gastroesophageal Reflux Disease, Adult Gastroesophageal reflux (GER) happens when acid from the stomach flows up into the tube that connects the mouth and the stomach (esophagus). Normally, food travels down the esophagus and stays in the stomach to be digested. However, when a person has GER, food and stomach acid sometimes move back up into the esophagus. If this becomes a more serious problem, the person may be diagnosed with a disease called gastroesophageal reflux disease (GERD). GERD occurs when the reflux: Happens often. Causes frequent or severe symptoms. Causes problems such as damage to the esophagus. When stomach acid comes in contact with the esophagus, the acid may cause inflammation in the esophagus. Over time, GERD may create small holes (ulcers) in the lining of the esophagus. What are the causes? This condition is caused by a problem with the muscle between the esophagus and the stomach (lower esophageal sphincter, or LES). Normally, the LES muscle closes after food passes through the esophagus to the stomach. When the LES is weakened or abnormal, it does not close properly, and that allows food and stomach acid to go back up into the esophagus. The LES can be weakened by certain dietary substances, medicines, and medical conditions, including: Tobacco use. Pregnancy. Having a hiatal hernia. Alcohol use. Certain foods and beverages, such  as coffee, chocolate, onions, and peppermint. What increases the risk? You are more likely to develop this condition if you: Have an increased body weight. Have a connective tissue disorder. Take NSAIDs, such as ibuprofen. What are the signs or symptoms? Symptoms of this condition include: Heartburn. Difficult or painful swallowing and the feeling of having a lump in the throat. A bitter taste in the mouth. Bad breath and having a large amount of saliva. Having an upset or bloated stomach and belching. Chest pain. Different conditions can cause chest pain. Make sure you see your health care provider if you experience chest pain. Shortness of breath or wheezing. Ongoing (chronic) cough or a nighttime cough. Wearing away of tooth enamel. Weight loss. How is this diagnosed? This condition may be diagnosed based on a medical history and a physical exam. To determine if you have mild or severe GERD, your health care provider may also monitor how you respond to treatment. You may also have tests, including: A test to examine your stomach and esophagus with a small camera (endoscopy). A test that measures the acidity level in your esophagus. A test that measures how much pressure is on your esophagus. A barium swallow or modified barium swallow test to show the shape, size, and functioning of your esophagus. How is this treated? Treatment for this condition may vary depending on how severe your symptoms are. Your health care provider may recommend: Changes to your diet. Medicine. Surgery. The goal of treatment is to help relieve your symptoms and to prevent complications. Follow these instructions at home: Eating and drinking  Follow a diet as recommended by your health care provider.  This may involve avoiding foods and drinks such as: Coffee and tea, with or without caffeine. Drinks that contain alcohol. Energy drinks and sports drinks. Carbonated drinks or sodas. Chocolate and  cocoa. Peppermint and mint flavorings. Garlic and onions. Horseradish. Spicy and acidic foods, including peppers, chili powder, curry powder, vinegar, hot sauces, and barbecue sauce. Citrus fruit juices and citrus fruits, such as oranges, lemons, and limes. Tomato-based foods, such as red sauce, chili, salsa, and pizza with red sauce. Fried and fatty foods, such as donuts, french fries, potato chips, and high-fat dressings. High-fat meats, such as hot dogs and fatty cuts of red and white meats, such as rib eye steak, sausage, ham, and bacon. High-fat dairy items, such as whole milk, butter, and cream cheese. Eat small, frequent meals instead of large meals. Avoid drinking large amounts of liquid with your meals. Avoid eating meals during the 2-3 hours before bedtime. Avoid lying down right after you eat. Do not exercise right after you eat. Lifestyle  Do not use any products that contain nicotine or tobacco. These products include cigarettes, chewing tobacco, and vaping devices, such as e-cigarettes. If you need help quitting, ask your health care provider. Try to reduce your stress by using methods such as yoga or meditation. If you need help reducing stress, ask your health care provider. If you are overweight, reduce your weight to an amount that is healthy for you. Ask your health care provider for guidance about a safe weight loss goal. General instructions Pay attention to any changes in your symptoms. Take over-the-counter and prescription medicines only as told by your health care provider. Do not take aspirin, ibuprofen, or other NSAIDs unless your health care provider told you to take these medicines. Wear loose-fitting clothing. Do not wear anything tight around your waist that causes pressure on your abdomen. Raise (elevate) the head of your bed about 6 inches (15 cm). You can use a wedge to do this. Avoid bending over if this makes your symptoms worse. Keep all follow-up  visits. This is important. Contact a health care provider if: You have: New symptoms. Unexplained weight loss. Difficulty swallowing or it hurts to swallow. Wheezing or a persistent cough. A hoarse voice. Your symptoms do not improve with treatment. Get help right away if: You have sudden pain in your arms, neck, jaw, teeth, or back. You suddenly feel sweaty, dizzy, or light-headed. You have chest pain or shortness of breath. You vomit and the vomit is green, yellow, or black, or it looks like blood or coffee grounds. You faint. You have stool that is red, bloody, or black. You cannot swallow, drink, or eat. These symptoms may represent a serious problem that is an emergency. Do not wait to see if the symptoms will go away. Get medical help right away. Call your local emergency services (911 in the [place] Do not drive yourself to the hospital. Summary Gastroesophageal reflux happens when acid from the stomach flows up into the esophagus. GERD is a disease in which the reflux happens often, causes frequent or severe symptoms, or causes problems such as damage to the esophagus. Treatment for this condition may vary depending on how severe your symptoms are. Your health care provider may recommend diet and lifestyle changes, medicine, or surgery. Contact a health care provider if you have new or worsening symptoms. Take over-the-counter and prescription medicines only as told by your health care provider. Do not take aspirin, ibuprofen, or other NSAIDs unless your health care provider told  you to do so. Keep all follow-up visits as told by your health care provider. This is important. This information is not intended to replace advice given to you by your health care provider. Make sure you discuss any questions you have with your health care provider. Document Revised: 02/20/2020 Document Reviewed: 02/20/2020 Elsevier Patient Education  [employer].

## 2022-09-19 NOTE — Progress Notes (Signed)
Spirometry pre and post done today. 

## 2022-09-22 NOTE — Progress Notes (Unsigned)
Office Visit    Patient Name: Megan [name] Date of Encounter: 09/23/2022  PCP:  Charlane Ferretti, MD   [place]  Cardiologist:  Kirk Ruths, MD  Advanced Practice Provider:  No care team member to display Electrophysiologist:  None      Chief Complaint    Megan [name] is a 67 y.o. female presents today for follow up after cardiac testing.   Past Medical History    Past Medical History:  Diagnosis Date   Allergic rhinitis    Allergy    Aneurysm of cardiac wall, congenital    a.  septal aneurysm extending into the RVOT (by cardiac MRI in 08/2013)   Anxiety    Aortic atherosclerosis (HCC)    Arthritis    Asthma    Basal cell carcinoma    Depression    Heart murmur    Hypothyroidism    on meds   Leukopenia    idiopathic   Lyme disease    Mitral valve prolapse    MVP (mitral valve prolapse)    Orthostatic hypotension    Osteoporosis    Pancreatic cyst    Peripheral neuropathy 06/09/2012   Pernicious anemia    Pernicious anemia    Pernicious anemia    Plantar fasciitis    Pneumothorax    Past Surgical History:  Procedure Laterality Date   COLONOSCOPY  2019   JMP-   COLONOSCOPY WITH PROPOFOL N/A 02/22/2013   Procedure: COLONOSCOPY WITH PROPOFOL;  Surgeon: Garlan Fair, MD;  Location: [facility];  Service: Endoscopy;  Laterality: N/A;   MOUTH SURGERY  01/2012   bone graft in mouth   NASAL SINUS SURGERY Right 06/12/2014   Procedure: RIGHT ENDOSCOPIC MAXILLARY ANTROSTOMY;  Surgeon: Ascencion Dike, MD;  Location: [place];  Service: ENT;  Laterality: Right;   ROOT CANAL  02/27/2012   TONSILLECTOMY  1962   WISDOM TOOTH EXTRACTION      Allergies  Allergies  Allergen Reactions   Augmentin [Amoxicillin-Pot Clavulanate] Diarrhea   Clindamycin/Lincomycin Diarrhea   Iron     Other reaction(s): severe constipation   Moxifloxacin Other (See Comments)    neuropathy Other reaction(s): neuropathy   Sodium Citrate      Other Reaction(s): GI Upset (intolerance)    History of Present Illness    Megan [name] is a 67 y.o. female with a hx of aneurysm of membranous ventricular septum, thyroid disease, aortic atherosclerosis last seen 08/27/22  Prior MRA 08/2013 aneurysmal dilation of membranous ventricular septum extending into RV outflow tract with no sinus of valsalva aneurysm. Carotid duplex 03/2015 normal. Echo 12/2016 normal LVEF, mild MR. Stress echo 09/2017 normal. CTA 01/2018 aneurysm of membranous ventricular septum that bulges into RV outflow tract. No connection between LV and RV. No sinus of valsalva aneurysm. Calcium score 0. ABIs 03/2019 revealed noncompressible LLE artery and normal right. Abdominal MRI 12/2019 aortic atherosclerosis, stable 51m cyst at jucntion f pancreatic body and tail felt likely indolent cystic neoplasm. Monitor 01/2020 due to palpitations SR, SB, ST with rare PAC/PVC and brief PAT. ABIs 09/2020 normal.   Seen 05/08/22 noting occasional chest discomfort at rest or with activity that was atypical for angina not recommended for further workup.   Seen 08/27/22 for chest pain, palpitations. ZIO and CTA ordered. Monitor with predominantly NSR, average rate 70 bpm, 6% PAC burden. Toprol '25mg'$  QD added. CTA scheduled for 09/26/22.  She presents today for follow up independently. She  had repeat lipid panel last week with LDL of 91. Prefers to avoid medications if at all possible. Still with intermittent palpitations, plans to reduce caffeine intake. Prefers PRN dosing of Metoprolol rather than daily dosing. She is concerned about medications (Metoprolol, Nitroglycerin) prior to CTA as notes last time she felt poorly and her blood pressure dropped. She continues to have short 60-90 second episodes of midsternal chest pain most often at rest. She participates in spin class without exertional dyspnea or chest pain.   EKGs/Labs/Other Studies Reviewed:   The following studies were reviewed today:  Zio  09/17/22 Monitor with predominantly normal sinus rhythm with average heart rate of 70 bpm.  Occasional fast heartbeat in the top or bottom chamber of the heart.  Early beats in the top chamber of the heart 6% of the time.  Triggered episodes were often associated with these early beats.  They are not dangerous but can cause annoying symptoms.  Recommend addition of metoprolol succinate 25 mg daily.  EKG:  EKG is not ordered today.   Recent Labs: 07/02/2022: ALT 12; BUN 18; Creatinine 0.61; Hemoglobin 13.5; Platelet Count 167; Potassium 4.0; Sodium 141  Recent Lipid Panel    Component Value Date/Time   CHOL 178 09/19/2022 0744   TRIG 41 09/19/2022 0744   HDL 78 09/19/2022 0744   CHOLHDL 2.3 09/19/2022 0744   CHOLHDL 2.7 07/02/2022 0823   VLDL 4 07/02/2022 0823   LDLCALC 91 09/19/2022 0744    Home Medications   Current Meds  Medication Sig   5-Hydroxytryptophan (5-HTP) 100 MG CAPS Take 100 mg by mouth daily.    albuterol (VENTOLIN HFA) 108 (90 Base) MCG/ACT inhaler Inhale 1 puff into the lungs daily as needed for wheezing or shortness of breath.    ARMOUR THYROID 30 MG tablet Take 1 tablet (30 mg total) by mouth daily.   Ascorbic Acid (VITAMIN C) POWD Take 2,500 mg by mouth daily.   Cholecalciferol (VITAMIN D3) 10000 units capsule Take 10,000 Units by mouth daily.    Cyanocobalamin (VITAMIN B-12 ER PO) Take 5,000 mcg by mouth daily.    estradiol (ESTRACE) 0.1 MG/GM vaginal cream Place 1 Applicatorful vaginally 3 (three) times a week.   estradiol (ESTRACE) 1 MG tablet Take 1 mg by mouth daily.   MAGNESIUM CITRATE PO Take 600 mg by mouth daily.   Menaquinone-7 (VITAMIN K2 PO) Take 1 tablet by mouth daily.   metoprolol succinate (TOPROL-XL) 25 MG 24 hr tablet Take 1 tablet (25 mg total) by mouth daily. Take with or immediately following a meal.   NON FORMULARY Take 1,000 mcg by mouth daily. Methyl Folate chewable tablet   progesterone (PROMETRIUM) 100 MG capsule Take 0.5 capsules by mouth  daily.   TESTOSTERONE UNDECANOATE PO Take by mouth daily.     Review of Systems      All other systems reviewed and are otherwise negative except as noted above.  Physical Exam    VS:  BP 94/60   Pulse 64   Ht '5\' 9"'$  (1.753 m)   Wt 133 lb (60.3 kg)   BMI 19.64 kg/m  , BMI Body mass index is 19.64 kg/m.  Wt Readings from Last 3 Encounters:  09/23/22 133 lb (60.3 kg)  09/19/22 132 lb 3.2 oz (60 kg)  09/16/22 130 lb (59 kg)    GEN: Well nourished, well developed, in no acute distress. HEENT: normal. Neck: Supple, no JVD, carotid bruits, or masses. Cardiac: RRR, no murmurs, rubs, or gallops. No  clubbing, cyanosis. Trace bilateral pretibial edema. .  Radials/PT 2+ and equal bilaterally.  Respiratory:  Respirations regular and unlabored, clear to auscultation bilaterally. GI: Soft, nontender, nondistended. MS: No deformity or atrophy. Skin: Warm and dry, no rash. Bilateral lower extremities with varicose veins Neuro:  Strength and sensation are intact. Psych: Normal affect.  Assessment & Plan    Chest pain in adult - Left sided chest pain occurring daily lasting 60-90 seconds at rest or with activity. Prior CTA 2019 with coronary calcium score of 0.She is understandably hesitant about Metoprolol/Nitroglycerin prior to CTA as she did not tolerate well last time. Will instead proceed with coronary calcium score  Aortic atherosclerosis - Prefers to avoid medications if possible and is hesitant about statins. Discussed maintaining LDL <100. Update lipid panel in 6 mos. If coronary calcification on CT, could consider low dose statin three times per week. However, she may still prefer lifestyle changes.   Palpitations - ZIO with 6% PAC burden. She prefers to avoid daily medications. We discussed Metoprolol tartrate 12.'5mg'$ -'25mg'$  PRN for palpitations.   Inactivity - Previously referred to PREP exercise program at [facility].Participating in spin classes.   Venous insufficiency - Bilateral LE  with varicose veins and trace pretibial edema. Discussed compression stockings, leg elevation. Follows with [facility].        Disposition: Follow up  after coronary calcium score  with Kirk Ruths, MD or APP.  Signed, Loel Dubonnet, NP 09/23/2022, 8:49 AM [place]

## 2022-09-23 ENCOUNTER — Telehealth: Payer: Self-pay

## 2022-09-23 ENCOUNTER — Encounter (HOSPITAL_BASED_OUTPATIENT_CLINIC_OR_DEPARTMENT_OTHER): Payer: Self-pay | Admitting: Family

## 2022-09-23 ENCOUNTER — Ambulatory Visit (INDEPENDENT_AMBULATORY_CARE_PROVIDER_SITE_OTHER): Payer: Medicare Other | Admitting: Family

## 2022-09-23 VITALS — BP 94/60 | HR 64 | Ht 69.0 in | Wt 133.0 lb

## 2022-09-23 DIAGNOSIS — R002 Palpitations: Secondary | ICD-10-CM | POA: Diagnosis not present

## 2022-09-23 DIAGNOSIS — I872 Venous insufficiency (chronic) (peripheral): Secondary | ICD-10-CM

## 2022-09-23 DIAGNOSIS — I491 Atrial premature depolarization: Secondary | ICD-10-CM | POA: Diagnosis not present

## 2022-09-23 DIAGNOSIS — R0789 Other chest pain: Secondary | ICD-10-CM

## 2022-09-23 MED ORDER — METOPROLOL TARTRATE 25 MG PO TABS: ORAL_TABLET | ORAL | 1 refills | Status: DC

## 2022-09-23 NOTE — Telephone Encounter (Signed)
She returned my call 1/5, explained PREP, wants to think about it, offered March class at [facility]; she will call me with her decision.

## 2022-09-23 NOTE — Patient Instructions (Signed)
Medication Instructions:  Your physician has recommended you make the following change in your medication:  Stop: Metoprolol Succinate  Start: Metoprolol Tartrate 12.5 mg (half tablet) as needed for palpitations.   *If you need a refill on your cardiac medications before your next appointment, please call your pharmacy*   Lab Work: Please return for Lab work in 6 months for fasting lipid panel and CMP. You may come to the...   [facility] (3rd floor) [address], [place], [place] [address]  Open: 8am-Noon and 1pm-4:30pm  Please ring the doorbell on the small table when you exit the elevator and the Lab Tech will come get you  [place] [facility] [address] [place], [place], [place] [address] Open: 8am-1pm, then 2pm-4:30pm   [place]- Please see attached locations sheet stapled to your lab work with address and hours.   If you have labs (blood work) drawn today and your tests are completely normal, you will receive your results only by: [place] (if you have MyChart) OR A paper copy in the mail If you have any lab test that is abnormal or we need to change your treatment, we will call you to review the results.   Testing/Procedures: Your physician has recommend you to have a coronary calcium score. This is a self pay test that will cost $99  Follow-Up: At [facility], you and your health needs are our priority.  As part of our continuing mission to provide you with exceptional heart care, we have created designated Provider Care Teams.  These Care Teams include your primary Cardiologist (physician) and Advanced Practice Providers (APPs -  Physician Assistants and Nurse Practitioners) who all work together to provide you with the care you need, when you need it.  We recommend signing up for the patient portal called "MyChart".  Sign up information is provided on this After Visit Summary.  MyChart is used to connect  with patients for Virtual Visits (Telemedicine).  Patients are able to view lab/test results, encounter notes, upcoming appointments, etc.  Non-urgent messages can be sent to your provider as well.   To learn more about what you can do with MyChart, go to [email or web address].    Your next appointment:   Follow up a couple days after Calcium Score with Laurann Montana, NP  Other Instructions Loel Dubonnet, NP will reach out to cancel your cardiac CTA

## 2022-09-25 ENCOUNTER — Ambulatory Visit (HOSPITAL_COMMUNITY)
Admission: RE | Admit: 2022-09-25 | Discharge: 2022-09-25 | Disposition: A | Discharge status: Home / Self Care | Payer: Medicare Other | Source: Ambulatory Visit | Attending: Family | Admitting: Family

## 2022-09-25 DIAGNOSIS — R0789 Other chest pain: Secondary | ICD-10-CM

## 2022-09-25 DIAGNOSIS — I491 Atrial premature depolarization: Secondary | ICD-10-CM

## 2022-09-25 DIAGNOSIS — R002 Palpitations: Secondary | ICD-10-CM | POA: Insufficient documentation

## 2022-09-26 ENCOUNTER — Encounter (HOSPITAL_COMMUNITY): Payer: Self-pay

## 2022-09-26 ENCOUNTER — Ambulatory Visit (HOSPITAL_COMMUNITY): Payer: Medicare Other

## 2022-09-29 NOTE — Progress Notes (Unsigned)
Office Visit    Patient Name: Megan [name] Date of Encounter: 09/30/2022  PCP:  Charlane Ferretti, MD   [place]  Cardiologist:  Kirk Ruths, MD  Advanced Practice Provider:  No care team member to display Electrophysiologist:  None      Chief Complaint    Megan [name] is a 67 y.o. female presents today for follow up after cardiac testing.   Past Medical History    Past Medical History:  Diagnosis Date   Allergic rhinitis    Allergy    Aneurysm of cardiac wall, congenital    a.  septal aneurysm extending into the RVOT (by cardiac MRI in 08/2013)   Anxiety    Aortic atherosclerosis (HCC)    Arthritis    Asthma    Basal cell carcinoma    Depression    Heart murmur    Hypothyroidism    on meds   Leukopenia    idiopathic   Lyme disease    Mitral valve prolapse    MVP (mitral valve prolapse)    Orthostatic hypotension    Osteoporosis    Pancreatic cyst    Peripheral neuropathy 06/09/2012   Pernicious anemia    Pernicious anemia    Pernicious anemia    Plantar fasciitis    Pneumothorax    Past Surgical History:  Procedure Laterality Date   COLONOSCOPY  2019   JMP-   COLONOSCOPY WITH PROPOFOL N/A 02/22/2013   Procedure: COLONOSCOPY WITH PROPOFOL;  Surgeon: Garlan Fair, MD;  Location: [facility];  Service: Endoscopy;  Laterality: N/A;   MOUTH SURGERY  01/2012   bone graft in mouth   NASAL SINUS SURGERY Right 06/12/2014   Procedure: RIGHT ENDOSCOPIC MAXILLARY ANTROSTOMY;  Surgeon: Ascencion Dike, MD;  Location: [place];  Service: ENT;  Laterality: Right;   ROOT CANAL  02/27/2012   TONSILLECTOMY  1962   WISDOM TOOTH EXTRACTION      Allergies  Allergies  Allergen Reactions   Augmentin [Amoxicillin-Pot Clavulanate] Diarrhea   Clindamycin/Lincomycin Diarrhea   Iron     Other reaction(s): severe constipation   Moxifloxacin Other (See Comments)    neuropathy Other reaction(s): neuropathy   Sodium Citrate      Other Reaction(s): GI Upset (intolerance)    History of Present Illness    Megan [name] is a 67 y.o. female with a hx of aneurysm of membranous ventricular septum, thyroid disease, aortic atherosclerosis last seen 09/23/22.  Prior MRA 08/2013 aneurysmal dilation of membranous ventricular septum extending into RV outflow tract with no sinus of valsalva aneurysm. Carotid duplex 03/2015 normal. Echo 12/2016 normal LVEF, mild MR. Stress echo 09/2017 normal. CTA 01/2018 aneurysm of membranous ventricular septum that bulges into RV outflow tract. No connection between LV and RV. No sinus of valsalva aneurysm. Calcium score 0. ABIs 03/2019 revealed noncompressible LLE artery and normal right. Abdominal MRI 12/2019 aortic atherosclerosis, stable 60m cyst at jucntion f pancreatic body and tail felt likely indolent cystic neoplasm. Monitor 01/2020 due to palpitations SR, SB, ST with rare PAC/PVC and brief PAT. ABIs 09/2020 normal.   Seen 05/08/22 noting occasional chest discomfort at rest or with activity that was atypical for angina not recommended for further workup.   Seen 08/27/22 for chest pain, palpitations. ZIO and CTA ordered. Monitor with predominantly NSR, average rate 70 bpm, 6% PAC burden. Toprol '25mg'$  QD added. CTA scheduled for 09/26/22.However she was nervous about pre-medications for CTA and was  instead scheduled for coronary calcium score which was 0.   She presents today for follow up. Feeling well since last seen and was reassured by her CT results. Her chest pain occurs at rest and not with her spin classes. Her palpitations have not been bothersome. Reports no exertional chest pain, dyspnea.   EKGs/Labs/Other Studies Reviewed:   The following studies were reviewed today:  CT cardiac scoring 09/25/22 FINDINGS: Coronary arteries: Normal origins.   Coronary Calcium Score: 0   Pericardium: Normal.   Ascending Aorta: Normal caliber.   Non-cardiac: See separate report from [facility]  Radiology.   IMPRESSION: Coronary calcium score of 0. This was 1st percentile for age-, race-, and sex-matched controls.    Zio 09/17/22 Monitor with predominantly normal sinus rhythm with average heart rate of 70 bpm.  Occasional fast heartbeat in the top or bottom chamber of the heart.  Early beats in the top chamber of the heart 6% of the time.  Triggered episodes were often associated with these early beats.  They are not dangerous but can cause annoying symptoms.  Recommend addition of metoprolol succinate 25 mg daily.  EKG:  EKG is not ordered today.   Recent Labs: 07/02/2022: ALT 12; BUN 18; Creatinine 0.61; Hemoglobin 13.5; Platelet Count 167; Potassium 4.0; Sodium 141  Recent Lipid Panel    Component Value Date/Time   CHOL 178 09/19/2022 0744   TRIG 41 09/19/2022 0744   HDL 78 09/19/2022 0744   CHOLHDL 2.3 09/19/2022 0744   CHOLHDL 2.7 07/02/2022 0823   VLDL 4 07/02/2022 0823   LDLCALC 91 09/19/2022 0744    Home Medications   Current Meds  Medication Sig   5-Hydroxytryptophan (5-HTP) 100 MG CAPS Take 100 mg by mouth daily.    albuterol (VENTOLIN HFA) 108 (90 Base) MCG/ACT inhaler Inhale 1 puff into the lungs daily as needed for wheezing or shortness of breath.    ARMOUR THYROID 30 MG tablet Take 1 tablet (30 mg total) by mouth daily.   Ascorbic Acid (VITAMIN C) POWD Take 2,500 mg by mouth daily.   Cholecalciferol (VITAMIN D3) 10000 units capsule Take 10,000 Units by mouth daily.    Cyanocobalamin (VITAMIN B-12 ER PO) Take 5,000 mcg by mouth daily.    estradiol (ESTRACE) 0.1 MG/GM vaginal cream Place 1 Applicatorful vaginally 3 (three) times a week.   estradiol (ESTRACE) 1 MG tablet Take 1 mg by mouth daily.   MAGNESIUM CITRATE PO Take 600 mg by mouth daily.   Menaquinone-7 (VITAMIN K2 PO) Take 1 tablet by mouth daily.   metoprolol tartrate (LOPRESSOR) 25 MG tablet Take half tablet twice daily as needed for palpitations. If half tablet does not resolve symptoms after 15  minutes, you may take an additional half tablet.   NON FORMULARY Take 1,000 mcg by mouth daily. Methyl Folate chewable tablet   nystatin (MYCOSTATIN) 100000 UNIT/ML suspension Take 4 mLs by mouth 4 (four) times daily. For 10 days   progesterone (PROMETRIUM) 100 MG capsule Take 0.5 capsules by mouth daily.   TESTOSTERONE UNDECANOATE PO Take by mouth daily.   UNABLE TO FIND Take by mouth daily. Med Name: [id number]     Review of Systems      All other systems reviewed and are otherwise negative except as noted above.  Physical Exam    VS:  BP 94/62   Pulse 63   Ht '5\' 9"'$  (1.753 m)   Wt 132 lb (59.9 kg)   BMI 19.49 kg/m  , BMI Body  mass index is 19.49 kg/m.  Wt Readings from Last 3 Encounters:  09/30/22 132 lb (59.9 kg)  09/23/22 133 lb (60.3 kg)  09/19/22 132 lb 3.2 oz (60 kg)    GEN: Well nourished, well developed, in no acute distress. HEENT: normal. Neck: Supple, no JVD, carotid bruits, or masses. Cardiac: RRR, no murmurs, rubs, or gallops. No clubbing, cyanosis. Trace bilateral pretibial edema. .  Radials/PT 2+ and equal bilaterally.  Respiratory:  Respirations regular and unlabored, clear to auscultation bilaterally. GI: Soft, nontender, nondistended. MS: No deformity or atrophy. Skin: Warm and dry, no rash. Bilateral lower extremities with varicose veins Neuro:  Strength and sensation are intact. Psych: Normal affect.  Assessment & Plan    Chest pain in adult - CTA 09/2022 coronary calcium score of 0. Present chest pain at rest atypical for angina. No indication for further workup.  Aortic atherosclerosis - Prefers to avoid medications if possible and is hesitant about statins. Discussed maintaining LDL <100. Update lipid panel in 6 mos. Will add on Lp(a) Apo(b) per her request.   Palpitations - ZIO with 6% PAC burden. She prefers to avoid daily medications. Continue Metoprolol tartrate 12.'5mg'$ -'25mg'$  PRN for palpitations.   Venous insufficiency - Bilateral LE with varicose  veins and trace pretibial edema. Notes much improvement with compression stockings. Continue  compression stockings, leg elevation. Follows with [facility].         Disposition: Follow up in 6 month(s) with Kirk Ruths, MD or APP.  Signed, Loel Dubonnet, NP 09/30/2022, 9:31 AM [place]

## 2022-09-30 ENCOUNTER — Ambulatory Visit (INDEPENDENT_AMBULATORY_CARE_PROVIDER_SITE_OTHER): Payer: Medicare Other | Admitting: Family

## 2022-09-30 ENCOUNTER — Encounter (HOSPITAL_BASED_OUTPATIENT_CLINIC_OR_DEPARTMENT_OTHER): Payer: Self-pay | Admitting: Family

## 2022-09-30 VITALS — BP 94/62 | HR 63 | Ht 69.0 in | Wt 132.0 lb

## 2022-09-30 DIAGNOSIS — I872 Venous insufficiency (chronic) (peripheral): Secondary | ICD-10-CM

## 2022-09-30 DIAGNOSIS — E782 Mixed hyperlipidemia: Secondary | ICD-10-CM

## 2022-09-30 DIAGNOSIS — I700 Atherosclerosis of aorta: Secondary | ICD-10-CM | POA: Diagnosis not present

## 2022-09-30 NOTE — Patient Instructions (Signed)
Medication Instructions:  Continue your current medications.   *If you need a refill on your cardiac medications before your next appointment, please call your pharmacy*   Lab Work: Your physician recommends that you return for lab work in 6 months for fasting labs (CMP, lipid panel, lipoprotein a, apolipoprotein B). Please have your labs 3-5 days before your next office visit so we have the results at the time of your visit.   If you have labs (blood work) drawn today and your tests are completely normal, you will receive your results only by: [place] (if you have MyChart) OR A paper copy in the mail If you have any lab test that is abnormal or we need to change your treatment, we will call you to review the results.   Testing/Procedures: Your coronary calcium score showed a calcium score of 0 which is great!   Follow-Up: At [facility], you and your health needs are our priority.  As part of our continuing mission to provide you with exceptional heart care, we have created designated Provider Care Teams.  These Care Teams include your primary Cardiologist (physician) and Advanced Practice Providers (APPs -  Physician Assistants and Nurse Practitioners) who all work together to provide you with the care you need, when you need it.  We recommend signing up for the patient portal called "MyChart".  Sign up information is provided on this After Visit Summary.  MyChart is used to connect with patients for Virtual Visits (Telemedicine).  Patients are able to view lab/test results, encounter notes, upcoming appointments, etc.  Non-urgent messages can be sent to your provider as well.   To learn more about what you can do with MyChart, go to [email or web address].    Your next appointment:   6 month(s)  Provider:   Kirk Ruths, MD  or Loel Dubonnet, NP    Other Instructions  Heart Healthy Diet Recommendations: A low-salt diet is recommended. Meats should be  grilled, baked, or boiled. Avoid fried foods. Focus on lean protein sources like fish or chicken with vegetables and fruits. The American Heart Association is a Microbiologist!  American Heart Association Diet and Lifeystyle Recommendations   Exercise recommendations: The American Heart Association recommends 150 minutes of moderate intensity exercise weekly. Try 30 minutes of moderate intensity exercise 4-5 times per week. This could include walking, jogging, or swimming. Keep up the good work your spin classes.

## 2022-10-03 ENCOUNTER — Ambulatory Visit (INDEPENDENT_AMBULATORY_CARE_PROVIDER_SITE_OTHER): Payer: Medicare Other | Admitting: Podiatry

## 2022-10-03 DIAGNOSIS — M79675 Pain in left toe(s): Secondary | ICD-10-CM | POA: Diagnosis not present

## 2022-10-03 DIAGNOSIS — B351 Tinea unguium: Secondary | ICD-10-CM | POA: Diagnosis not present

## 2022-10-03 DIAGNOSIS — M79674 Pain in right toe(s): Secondary | ICD-10-CM | POA: Diagnosis not present

## 2022-10-03 NOTE — Progress Notes (Unsigned)
Subjective: Chief Complaint  Patient presents with   Nail Problem    Thick painful toenails, 3 month follow up    67 year old female presents for above complaints.  She said the nails continue to get thick and cause discomfort.  She has a spot on the bottom of her arch of the right foot she wants to have it checked.  No open sores.  No recent injury or changes.    She follows with Dr. Doran Durand for AVN on the right 2nd metatarsal.   Objective: AAO x3, NAD DP/PT pulses palpable bilaterally, CRT less than 3 seconds Nails are hypertrophic, dystrophic, brittle, discolored, elongated 10. No surrounding redness or drainage. Tenderness nails 1-5 bilaterally.  Mild incurvation of the nail borders of the right hallux toenail.  Hyperkeratotic lesion noted to right arch of the foot along the area of concern.  There is no hyperpigmentation.  Upon debridement there is no underlying ulceration drainage or any signs of infection.  No open lesions. No pain with calf compression, swelling, warmth, erythema  Assessment: Ingrown toenail, symptomatic onychomycosis  Plan: -All treatment options discussed with the patient including all alternatives, risks, complications.  -Sharply debrided the nails x10 without any complications or bleeding.  -Debrided the calluses occurred to any complications or bleeding.  Moisturizer daily. -Daily foot inspection  Trula Slade DPM

## 2022-10-07 ENCOUNTER — Ambulatory Visit (HOSPITAL_BASED_OUTPATIENT_CLINIC_OR_DEPARTMENT_OTHER): Payer: Medicare Other | Admitting: Family

## 2022-10-09 ENCOUNTER — Encounter: Payer: Self-pay | Admitting: Gastroenterology

## 2022-10-09 ENCOUNTER — Ambulatory Visit (INDEPENDENT_AMBULATORY_CARE_PROVIDER_SITE_OTHER): Payer: Medicare Other | Admitting: Gastroenterology

## 2022-10-09 VITALS — BP 90/60 | HR 66 | Ht 69.0 in | Wt 131.0 lb

## 2022-10-09 DIAGNOSIS — R140 Abdominal distension (gaseous): Secondary | ICD-10-CM

## 2022-10-09 DIAGNOSIS — R208 Other disturbances of skin sensation: Secondary | ICD-10-CM | POA: Diagnosis not present

## 2022-10-09 DIAGNOSIS — R102 Pelvic and perineal pain: Secondary | ICD-10-CM | POA: Diagnosis not present

## 2022-10-09 MED ORDER — MAGIC MOUTHWASH W/LIDOCAINE: 5.0000 mL | Freq: Three times a day (TID) | ORAL | 0 refills | Status: DC

## 2022-10-09 NOTE — Patient Instructions (Addendum)
You have been scheduled for an pelvic ultrasound at [facility] (1st floor of hospital) on 10/15/2022 at 2:00pm. Please arrive 30 minutes prior to your appointment for registration. Make certain not to have solid  foods 6 hours prior to your appointment. You may have liquids .Please make sure that you have a full bladder. Should you need to reschedule your appointment, please contact radiology at [phone]. This test typically takes about 30 minutes to perform.  Consider seeing a ear nose and throat specialist. Consider trying the Pepcid regimen we discussed.  We have sent the following medications to your pharmacy for you to pick up at your convenience: Magic mouthwash  Due to recent changes in healthcare laws, you may see the results of your imaging and laboratory studies on MyChart before your provider has had a chance to review them.  We understand that in some cases there may be results that are confusing or concerning to you. Not all laboratory results come back in the same time frame and the provider may be waiting for multiple results in order to interpret others.  Please give [place] 48 hours in order for your provider to thoroughly review all the results before contacting the office for clarification of your results.   _______________________________________________________  If your blood pressure at your visit was 140/90 or greater, please contact your primary care physician to follow up on this.  _______________________________________________________  If you are age 14 or older, your body mass index should be between 23-30. Your Body mass index is 19.35 kg/m. If this is out of the aforementioned range listed, please consider follow up with your Primary Care Provider.  If you are age 79 or younger, your body mass index should be between 19-25. Your Body mass index is 19.35 kg/m. If this is out of the aformentioned range listed, please consider follow up with your Primary Care  Provider.   ________________________________________________________  The [facility] providers would like to encourage you to use [facility] to communicate with providers for non-urgent requests or questions.  Due to long hold times on the telephone, sending your provider a message by [facility] may be a faster and more efficient way to get a response.  Please allow 48 business hours for a response.  Please remember that this is for non-urgent requests.  _______________________________________________________ Thank you for trusting me with your gastrointestinal care!   Alonza Bogus PA-C

## 2022-10-09 NOTE — Progress Notes (Unsigned)
10/09/2022 Megan [name] [id number] 22-Aug-1956   HISTORY OF PRESENT ILLNESS: This is a 67 year old female who is a patient of Dr. Vena Rua.  Past Medical History:  Diagnosis Date   Allergic rhinitis    Allergy    Aneurysm of cardiac wall, congenital    a.  septal aneurysm extending into the RVOT (by cardiac MRI in 08/2013)   Anxiety    Aortic atherosclerosis (HCC)    Arthritis    Asthma    Basal cell carcinoma    Depression    Heart murmur    Hypothyroidism    on meds   Leukopenia    idiopathic   Lyme disease    Mitral valve prolapse    MVP (mitral valve prolapse)    Orthostatic hypotension    Osteoporosis    Pancreatic cyst    Peripheral neuropathy 06/09/2012   Pernicious anemia    Pernicious anemia    Pernicious anemia    Plantar fasciitis    Pneumothorax    Past Surgical History:  Procedure Laterality Date   COLONOSCOPY  2019   JMP-   COLONOSCOPY WITH PROPOFOL N/A 02/22/2013   Procedure: COLONOSCOPY WITH PROPOFOL;  Surgeon: Garlan Fair, MD;  Location: [facility];  Service: Endoscopy;  Laterality: N/A;   MOUTH SURGERY  01/2012   bone graft in mouth   NASAL SINUS SURGERY Right 06/12/2014   Procedure: RIGHT ENDOSCOPIC MAXILLARY ANTROSTOMY;  Surgeon: Ascencion Dike, MD;  Location: [facility];  Service: ENT;  Laterality: Right;   ROOT CANAL  02/27/2012   TONSILLECTOMY  1962   WISDOM TOOTH EXTRACTION      reports that she has never smoked. She has never used smokeless tobacco. She reports that she does not drink alcohol and does not use drugs. family history includes Angina in her mother; Breast cancer in her paternal grandmother; Colon cancer (age of onset: 62) in her mother; Colon polyps in her father and mother; Diabetes in her paternal grandmother; Heart failure in her father; Hyperlipidemia in her father; Hypertension in her father and mother; Kidney failure in her father; Lung cancer in her paternal grandfather; Lung cancer (age of onset:  44) in her mother; Prostate cancer (age of onset: 37) in her father. Allergies  Allergen Reactions   Augmentin [Amoxicillin-Pot Clavulanate] Diarrhea   Clindamycin/Lincomycin Diarrhea   Iron     Other reaction(s): severe constipation   Moxifloxacin Other (See Comments)    neuropathy Other reaction(s): neuropathy   Sodium Citrate     Other Reaction(s): GI Upset (intolerance)      Outpatient Encounter Medications as of 10/09/2022  Medication Sig   5-Hydroxytryptophan (5-HTP) 100 MG CAPS Take 100 mg by mouth daily.    albuterol (VENTOLIN HFA) 108 (90 Base) MCG/ACT inhaler Inhale 1 puff into the lungs daily as needed for wheezing or shortness of breath.    ARMOUR THYROID 30 MG tablet Take 1 tablet (30 mg total) by mouth daily.   Ascorbic Acid (VITAMIN C) POWD Take 2,500 mg by mouth daily.   Cholecalciferol (VITAMIN D3) 10000 units capsule Take 10,000 Units by mouth daily.    Cyanocobalamin (VITAMIN B-12 ER PO) Take 5,000 mcg by mouth daily.    estradiol (ESTRACE) 0.1 MG/GM vaginal cream Place 1 Applicatorful vaginally 3 (three) times a week.   estradiol (ESTRACE) 1 MG tablet Take 1 mg by mouth daily.   fluconazole (DIFLUCAN) 50 MG tablet Take 50 mg by mouth daily.   MAGNESIUM CITRATE  PO Take 600 mg by mouth daily.   Menaquinone-7 (VITAMIN K2 PO) Take 1 tablet by mouth daily.   metoprolol tartrate (LOPRESSOR) 25 MG tablet Take half tablet twice daily as needed for palpitations. If half tablet does not resolve symptoms after 15 minutes, you may take an additional half tablet.   NON FORMULARY Take 1,000 mcg by mouth daily. Methyl Folate chewable tablet   progesterone (PROMETRIUM) 100 MG capsule Take 0.5 capsules by mouth daily.   TESTOSTERONE UNDECANOATE PO Take by mouth daily.   UNABLE TO FIND Take by mouth daily. Med Name: [id number]   [DISCONTINUED] nystatin (MYCOSTATIN) 100000 UNIT/ML suspension Take 4 mLs by mouth 4 (four) times daily. For 10 days   No facility-administered encounter  medications on file as of 10/09/2022.     REVIEW OF SYSTEMS  : All other systems reviewed and negative except where noted in the History of Present Illness.   PHYSICAL EXAM: BP 90/60 (BP Location: Left Arm, Patient Position: Sitting, Cuff Size: Normal)   Pulse 66   Ht 5' 9"$  ([id number] m)   Wt 131 lb (59.4 kg)   SpO2 96%   BMI 19.35 kg/m  General: Well developed white female in no acute distress Head: Normocephalic and atraumatic Eyes:  Sclerae anicteric, conjunctiva pink. Ears: Normal auditory acuity Mouth:  Does have some abnormalities on her tongue.  Question geographic tongue, but she does also have a sore on the left side of her tongue. Lungs: Clear throughout to auscultation; no W/R/R. Heart: Regular rate and rhythm; no M/R/G. Abdomen: Soft, non-distended.  BS present.  Non-tender.  There is some fullness on the right lower abdomen and asymmetry in her lower abdomen. Musculoskeletal: Symmetrical with no gross deformities  Skin: No lesions on visible extremities Extremities: No edema  Neurological: Alert oriented x 4, grossly non-focal Psychological:  Alert and cooperative. Normal mood and affect  ASSESSMENT AND PLAN: *Burning mouth/tongue *Pancreatic cyst/sidebranch IPMN --there is overall stability of her pancreatic cyst at 11 mm.  She did choose to have the MRI/MRCP without gadolinium over concern for contrast related complication.  Last imaging 01/2022 with repeat recommended in 12 months/one year. *Pelvic discomfort and fullness with bloating sensation more so on the right: We discussed possible CT scan.  She does still have all of her gynecologic parts.  Will start with a pelvic ultrasound to evaluate ovaries, etc.  Question if this just could be some anatomic asymmetry or redistribution of her adipose tissue. *Pernicious anemia --continue B12 supplementation.  Confirmed previously by antiparietal and anti-intrinsic factor antibody.  EGD deferred by the patient though continues  to be recommended.  *Family history of colon cancer --repeat colonoscopy recommended July 2024   **Overall in regards to procedures she needs to try to figure out and minimize the amount of time that she is sedated since she does not have anybody here to bring her back and forth from procedures she will need somebody to come from [place] to be with her for those.   CC:  Charlane Ferretti, MD

## 2022-10-10 ENCOUNTER — Encounter: Payer: Self-pay | Admitting: Gastroenterology

## 2022-10-10 DIAGNOSIS — R102 Pelvic and perineal pain: Secondary | ICD-10-CM | POA: Insufficient documentation

## 2022-10-10 DIAGNOSIS — R208 Other disturbances of skin sensation: Secondary | ICD-10-CM | POA: Insufficient documentation

## 2022-10-10 DIAGNOSIS — R140 Abdominal distension (gaseous): Secondary | ICD-10-CM | POA: Insufficient documentation

## 2022-10-11 ENCOUNTER — Ambulatory Visit (HOSPITAL_COMMUNITY)
Admission: RE | Admit: 2022-10-11 | Discharge: 2022-10-11 | Disposition: A | Discharge status: Home / Self Care | Payer: Medicare Other | Source: Ambulatory Visit | Attending: Gastroenterology | Admitting: Gastroenterology

## 2022-10-11 DIAGNOSIS — R102 Pelvic and perineal pain: Secondary | ICD-10-CM | POA: Diagnosis present

## 2022-10-11 DIAGNOSIS — R140 Abdominal distension (gaseous): Secondary | ICD-10-CM | POA: Insufficient documentation

## 2022-10-14 ENCOUNTER — Other Ambulatory Visit: Payer: Self-pay

## 2022-10-14 MED ORDER — MAGIC MOUTHWASH W/LIDOCAINE: 5.0000 mL | Freq: Three times a day (TID) | ORAL | 0 refills | Status: DC

## 2022-10-14 NOTE — Progress Notes (Signed)
Addendum: Reviewed and agree with assessment and management plan. Angelicia Lessner M, MD  

## 2022-10-15 ENCOUNTER — Other Ambulatory Visit (HOSPITAL_COMMUNITY): Payer: Medicare Other

## 2022-10-17 LAB — HM DEXA SCAN

## 2022-10-21 ENCOUNTER — Other Ambulatory Visit: Payer: Self-pay | Admitting: *Deleted

## 2022-10-21 LAB — LAB REPORT - SCANNED: EGFR: 100

## 2022-10-21 MED ORDER — MAGIC MOUTHWASH W/LIDOCAINE: 5.0000 mL | Freq: Three times a day (TID) | ORAL | 0 refills | Status: DC

## 2022-11-05 ENCOUNTER — Encounter: Payer: Self-pay | Admitting: Internal Medicine

## 2022-11-10 NOTE — Telephone Encounter (Signed)
We can proceed with MRI abdomen with and without contrast pancreas protocol to follow-up pancreatic cystic lesion After MRI she can see me in the office, okay to place her in an earlier slot after the MRI Please let her know we are working on this

## 2022-11-11 ENCOUNTER — Other Ambulatory Visit: Payer: Self-pay

## 2022-11-11 DIAGNOSIS — K862 Cyst of pancreas: Secondary | ICD-10-CM

## 2022-11-11 NOTE — Telephone Encounter (Signed)
Ok without only

## 2022-11-13 ENCOUNTER — Ambulatory Visit (INDEPENDENT_AMBULATORY_CARE_PROVIDER_SITE_OTHER): Payer: Medicare Other | Admitting: Internal Medicine

## 2022-11-13 ENCOUNTER — Encounter: Payer: Self-pay | Admitting: Internal Medicine

## 2022-11-13 VITALS — BP 108/62 | HR 64 | Ht 69.0 in | Wt 131.4 lb

## 2022-11-13 DIAGNOSIS — M810 Age-related osteoporosis without current pathological fracture: Secondary | ICD-10-CM | POA: Diagnosis not present

## 2022-11-13 DIAGNOSIS — E039 Hypothyroidism, unspecified: Secondary | ICD-10-CM

## 2022-11-13 DIAGNOSIS — E162 Hypoglycemia, unspecified: Secondary | ICD-10-CM | POA: Diagnosis not present

## 2022-11-13 NOTE — Patient Instructions (Signed)
Please continue Armour 30 mg daily.  Take the thyroid hormone every day, with water, at least 30 minutes before breakfast, separated by at least 4 hours from: - acid reflux medications - calcium - iron - multivitamins  Please stop at the lab.  Please come back for a follow-up appointment in 1 year.

## 2022-11-13 NOTE — Progress Notes (Signed)
Patient ID: Megan [name], female   DOB: [birth year], 67 y.o.   MRN: [id number]   HPI  Megan [name] is a 67 y.o.-year-old female, initially referred by her OB/GYN doctor, Dr. Ronita Hipps, returning for follow-up for osteoporosis and hypothyroidism.  Last visit 7 months ago.   Interim history: No falls or fractures since last visit.  She is exercising at the gym -also has a Physiological scientist. Her constipation resolved after stopping almond milk. She stopped seeing the [facility] when they stopped offering virtual visits - now only [facility].  She was recently found to have a high concentration of mold in blood.  She was recommended an antifungal for 6 months.  She went to see ID who recommended against it but at the end agreed that she could do a 1 to 2 month course.  She did this and she started to feel poorly.  Did not recover completely yet. She had Covid19 2x: in 03/2022 and 05/2022. She stopped Evenity - 03/2022 around the time of her 1st COVID episode.  She mentions she had a low blood count after every Evenity injection.  She was very sick at that time and did not pursue other osteoporosis treatments so she ended up stopping Evenity abruptly.  No falls or fractures since last visit. Also, since last visit, she developed breast pressure and pain from her hormonal supplements given by the integrative medicine provider.  Apparently, these are titrated up for osteoporosis treatment.  She did not feel good about this plan so she recently reduced the doses to 50% by herself.  She feels much better. She had an abnormal transvaginal ultrasound recently and had to have an endometrial biopsy.  No malignancy, but a D&C was recommended.  She is ambivalent about getting this.  She saw Dr. Ronita Hipps, who recommended against it.  She would like to get a second opinion about this.  Perceived hypoglycemia  Reviewed history: At the end of last 2021, she described that she  had dental surgery for a longstanding infection and could only eat soft foods for a period of 3 weeks.  She also cut down carbs after she was found to have a high cholesterol level.  She started to feel poorly with dizziness and flushing and finally presented to the emergency room 07/03/2020.  After being given glucose, sugars improved.  At home, she continued to monitor her blood sugars consistently and she did not have blood sugars lower than 62.Reviewing her detailed diet and blood sugar records, they ranged between 62 (although was, before lunch) to 117.  I reassured her that this was not abnormal.  Reviewed message from patient from 08/23/2020: I am so  sorry to bother you but I am not feeling well and am a little scared - I was able to get an appointment with you for Monday, 1/3 but if you have any suggestions for me over the weekend, I would appreciate it.  My blood sugar at 5AM when I woke up was 112 (I ate well yesterday and had oatmeal with cinnamon at 7PM which was my last food of the day).  I wasn't hungry so didn't eat breakfast which is not unusual for me and at noon it was 61.  I made his a large smoothie (cranberries, avocado, banana, greens, 2 cups almond milk, protein powder) and at 1:30PM, my sugar is 58 and I am dizzy and my eyes are off.  I just drank 3/4 of a coke.  I  am eating really well and don't understand why it is high when I wake up and so low 1.5 hours after eating lunch.  I am being really careful to not eat chocolate covered things (which I sometimes enjoy) and higher fat things like almond butter, but this "clean" diet seems to get my sugar really low.  If you see this before Monday and have any suggestions outside of coke or sugar pills, I would appreciate it.  Thanks!  Megan [name]  We discussed about improving diet at our visit from 08/2020.  I made specific suggestions to avoid postprandial hypoglycemia.  I recommended against a CGM.  She continues to see multiple  specialists.  Since last visit, she saw a specialist at [facility] and had a CGM placed.  Sugars were all at goal so she is came off the CGM.  Also, since last visit she had a normal cosyntropin stimulation test ordered by the provider at [facility] (02/05/2021): Time 0: ACTH 18.3, cortisol 9.3 Time 60 minutes: Cortisol 23.7  She was diagnosed with osteoporosis in 2006, but she had lower BMD even before menopause in 2004.  She continues to follow-up with a bone specialist in [place] (Dr. Marcelino Scot) but would also want to continue to follow-up with me for this problem.  Reviewed previous DXA scans: Date L1-L4 T score FN T score 33% distal Radius  10/17/2022 [facility]) -3.4 RFN: -2.3 LFN: -2.3 -4.2  03/31/2022 [facility]) -2.8 RFN: -2.5 LFN: -2.4 -4.4  11/25/2021 [facility]) -3.0 RFN: ? LFN: -2.7 ?  10/01/2021 [facility]) -2.7 ? -4.4  09/18/2021 [facility]) -2.8 RFN: ? LFN: -2.2 ?  03/01/2021 [facility] [place]) -3.9 (-6.3%*) RFN: -2.8 LFN: -2.4 -4.5  11/09/2019 [facility] [place]) -3.5 (-0.4%) RFN: -2.4  LFN: -2.4 n/a  11/03/2018 [facility] [place]) -3.5 (-2.8%) RFN: -2.3 LFN: -2.1  n/a  11/10/2017 [facility] [place]) -2.3 RFN: n/a LFN: n/a n/a  08/13/2017 -3.3 (-4.4%*) RFN: -2.0 LFN: -1.8 n/a  08/10/2015 -3.0 RFN: -2.0 LFN: -2.2 n/a   Other reports reviewed Per records brought by patient: L1-L4: 06/2013: -3.5 10/2007: -3.1 07/2005: -2.5 05/2003: -2.2  RFN: 06/2013: -2.2 10/2007: -2.0 07/2005: -2.1 05/2003: -1.8  LFN: 06/2013: -2.2 10/2007: -2.1 07/2005: -2.2 05/2003: -1.9  She had 1 fracture: - 07/2016: Right rib  03/2022: CTX 276 03/19/2022: NTX 67  No recent falls or fractures.  Reviewed previous osteoporosis therapy: - Fosamax and Actonel - 2001-2004 (no help) - Estradiol 0.0125 mg + Progesterone - 2014-2016 (improvement in spine BMD) >> she  just started a combination of Estradiol-Prometrium-Testosterone 0.06-5-0.33 mg/gtt  She previously refused other medications for  osteoporosis. - In 02/2021, she saw Dr. Marcelino Scot and he recommended again Evenity.  She started Evenity 02/2021 >> 07/2021 (b/c signif. Mm and joint aches), then 3 months off, then 2 half doses. Plan was to continue half dose until 11/2022. However, she came off Evenity in 03/2022 during her COVID episode - no treatment after Evenity  No history of vitamin D deficiency.  Reviewed previous vitamin D levels: 08/26/2022: Vitamin D 60.1 07/26//2023: vitamin D 56.2 Lab Results  Component Value Date   VD25OH 63.8 02/29/2020  07/14/2019: Vitamin D 57 01/27/2019: Vitamin D 50.7-on 5000 units vitamin D daily 03/29/2018: Vitamin D 82.5 09/2017: Vitamin D 72 06/17/2016: Vitamin D 35  She takes vitamin K2 +5000 units vitamin D daily.  She continues to exercise consistently.  Working with a Physiological scientist and introducing new exercises every 3 weeks.  Also using a vibration platform 10 minutes twice a day.  She tried [place] >> got hurt - also had a HA that lasted 3 weeks.  She retried this earlier in 2021 and she again hurt herself and developed a headache.  No history of repeated steroid courses.  In 2018, she ruled out for multiple myeloma by protein electrophoresis.  She has polyclonal gammopathy and follows with hematology.  She was also investigated by nephrology for proteinuria.  Menopause was at 67 years old.  Pt does have a FH of osteoporosis: M - had spinal fx's, father - Lupron.  She was seeing Dr. Knox Saliva >> then switched to another functional medicine doctor. She was told she had mold in her house, low WBC, tested positive for heavy metals, iron is low (she had a recent low ferritin level, also - 01/2019).    No history of kidney stones, hyper or hypocalcemia or hyperparathyroidism: 10/16/2022: Calcium 9.6 08/26/2022: Calcium 9.8 Lab Results  Component Value Date   CALCIUM 9.2 07/02/2022   CALCIUM 9.5 04/21/2022   CALCIUM 9.7 04/11/2022   CALCIUM 9.4 07/29/2021   CALCIUM  9.0 04/22/2021   CALCIUM 9.0 04/12/2021   CALCIUM 9.5 07/01/2020   CALCIUM 9.6 05/29/2020   CALCIUM 9.3 12/20/2019   CALCIUM 9.5 07/20/2019  02/03/2019: calcium 9.7 03/29/2018: PTH 48  No history of CKD.  Latest BUN/creatinine: 10/16/2022: 12/0.56, GFR 100 08/26/2022: 9/0.66, GFR 96, glucose 84 03/19/2022: 11/0.67 Lab Results  Component Value Date   BUN 18 07/02/2022   CREATININE 0.61 07/02/2022   Hypothyroidism.  She was initially on Nature-Throid, then she had to switch to Armour due to [employer].  She noticed some hair loss after switching, but no other symptoms.  In 2019, she was taken off the medication by one of her providers but she felt terrible and restarted.  Reviewed her TFTs: 08/26/2022: TSH 2.01 03/19/2022: TSH 1.59 Lab Results  Component Value Date   TSH 2.60 03/06/2021  01/27/2019: TSH: 2.21 Lab Results  Component Value Date   TSH 2.78 06/30/2018  03/29/2018: TSH 3.17, free T4 0.75, free T3 3.0 02/10/2018: TSH 1.89, free T4 0.9, free T3 2.2 11/04/2017: TSH 2.74, free T4 0.59, free T3 5.2 08/14/2017: TSH 1.780, fT4 1.08 - pt was on Biotin 10,000 mcg when labs drawn; Selenium and Urinary iodine normal 07/19/2008: TSH 6.76  On Armour 30 mg daily, increased 09/2021, by her Integrative Med Provider >> may stop seeing [facility] in the near future and will come here for refills. - in am - fasting - at least 30 min from b'fast - no calcium - no iron - no multivitamins - no PPIs - not on Biotin  She is on multiple other supplements, including alpha-lipoic acid, co-Q10, 5 HTP, vitamin B12, probiotics, magnesium, etc. We stopped her vitamin A since this was found to exacerbate osteoporosis. She stopped the supplement and also her multivitamins due to elevated B6 vitamin level.  On 02/10/2018, her vitamin B6 was 10.9 (2.0-32.8), normal. In 2019, she  went to the emergency room for chest pain after she started a natural supplement (nitric balance-ATP), which she stopped  since.  Elevated estradiol:  Reviewed history: She had previously undetectable estradiol levels in 07/25/2013, 08/21/2014, 05/05/2016, 06/03/2016, however, in 04/25/2017, her level was 44.3 pg/mL.  At that time, she was giving estriol to her dog without gloves.  She started to use gloves and her level became undetectable again.  She then started to reuse the same gloves and in 03/23/2018, the level was 197.8 pg/mL.  She started to use  gloves only once for administration and her level decreased to 101.8 on 03/29/2018.  She was very worried about this despite reassurance that the pattern of estrogen increase was consistent with interference with the assay rather than a tumoral source. She had imaging tests that initially showed an ovarian cyst which resolved. The elevated estrogen was deemed to be due to interference with the assay from her high-dose biotin.  She has a history of pernicious anemia and IDA.  She got iron infusions. She has a stable 8 mm pancreatic cyst-no intervention needed, only repeat MRI for follow-up. She continues to have chronic fatigue.  She saw infectious disease. She has a history of avascular necrosis in her right foot.  ROS: + See HPI  Past Medical History:  Diagnosis Date   Allergic rhinitis    Allergy    Aneurysm of cardiac wall, congenital    a.  septal aneurysm extending into the RVOT (by cardiac MRI in 08/2013)   Anxiety    Aortic atherosclerosis (HCC)    Arthritis    Asthma    Basal cell carcinoma    Depression    Heart murmur    Hypothyroidism    on meds   Leukopenia    idiopathic   Lyme disease    Mitral valve prolapse    MVP (mitral valve prolapse)    Orthostatic hypotension    Osteoporosis    Pancreatic cyst    Peripheral neuropathy 06/09/2012   Pernicious anemia    Pernicious anemia    Pernicious anemia    Plantar fasciitis    Pneumothorax    Past Surgical History:  Procedure Laterality Date   COLONOSCOPY  2019   JMP-   COLONOSCOPY  WITH PROPOFOL N/A 02/22/2013   Procedure: COLONOSCOPY WITH PROPOFOL;  Surgeon: Garlan Fair, MD;  Location: [facility];  Service: Endoscopy;  Laterality: N/A;   MOUTH SURGERY  01/2012   bone graft in mouth   NASAL SINUS SURGERY Right 06/12/2014   Procedure: RIGHT ENDOSCOPIC MAXILLARY ANTROSTOMY;  Surgeon: Ascencion Dike, MD;  Location: [facility];  Service: ENT;  Laterality: Right;   ROOT CANAL  02/27/2012   TONSILLECTOMY  1962   WISDOM TOOTH EXTRACTION     Social History   Socioeconomic History   Marital status: Divorced    Spouse name: Not on file   Number of children: 0   Years of education: Not on file   Highest education level: Not on file  Occupational History    Comment: Career and life coach  Tobacco Use   Smoking status: Never   Smokeless tobacco: Never  Vaping Use   Vaping Use: Never used  Substance and Sexual Activity   Alcohol use: No    Alcohol/week: 0.0 standard drinks of alcohol   Drug use: No   Sexual activity: Not Currently  Other Topics Concern   Not on file  Social History Narrative   Patient is single and lives alone.   Patient is self-employed, career and life coaching.   Patient drinks two to four cups of caffeine daily.   Right handed    Social Determinants of Health   Financial Resource Strain: Not on file  Food Insecurity: Not on file  Transportation Needs: Not on file  Physical Activity: Not on file  Stress: Not on file  Social Connections: Not on file  Intimate Partner Violence: Not on file   Current Outpatient Medications on File Prior to Visit  Medication Sig Dispense Refill  5-Hydroxytryptophan (5-HTP) 100 MG CAPS Take 100 mg by mouth daily.      albuterol (VENTOLIN HFA) 108 (90 Base) MCG/ACT inhaler Inhale 1 puff into the lungs daily as needed for wheezing or shortness of breath.      AMBULATORY NON FORMULARY MEDICATION daily. Medication Name: triphala     AMBULATORY NON FORMULARY MEDICATION 3 (three) times daily.  Medication Name: Amino acids double wood     ARMOUR THYROID 30 MG tablet Take 1 tablet (30 mg total) by mouth daily. 90 tablet 3   Ascorbic Acid (VITAMIN C) POWD Take 2,500 mg by mouth daily.     Cholecalciferol (VITAMIN D3) 10000 units capsule Take 10,000 Units by mouth daily.      Cyanocobalamin (VITAMIN B-12 ER PO) Take 5,000 mcg by mouth daily.      estradiol (ESTRACE) 0.1 MG/GM vaginal cream Place 1 Applicatorful vaginally 3 (three) times a week.     estradiol (ESTRACE) 1 MG tablet Take 1 mg by mouth daily.     fluconazole (DIFLUCAN) 50 MG tablet Take 50 mg by mouth daily.     magic mouthwash w/lidocaine SOLN Take 5 mLs by mouth 3 (three) times daily. 420 mL 0   MAGNESIUM CITRATE PO Take 600 mg by mouth daily.     Menaquinone-7 (VITAMIN K2 PO) Take 1 tablet by mouth daily.     metoprolol tartrate (LOPRESSOR) 25 MG tablet Take half tablet twice daily as needed for palpitations. If half tablet does not resolve symptoms after 15 minutes, you may take an additional half tablet. 30 tablet 1   NON FORMULARY Take 1,000 mcg by mouth daily. Methyl Folate chewable tablet     progesterone (PROMETRIUM) 100 MG capsule Take 0.5 capsules by mouth daily.     TESTOSTERONE UNDECANOATE PO Take by mouth daily.     UNABLE TO FIND Take by mouth daily. Med Name: [id number]     No current facility-administered medications on file prior to visit.   Allergies  Allergen Reactions   Augmentin [Amoxicillin-Pot Clavulanate] Diarrhea   Clindamycin/Lincomycin Diarrhea   Iron     Other reaction(s): severe constipation   Moxifloxacin Other (See Comments)    neuropathy Other reaction(s): neuropathy   Sodium Citrate     Other Reaction(s): GI Upset (intolerance)   Family History  Problem Relation Age of Onset   Hyperlipidemia Father    Heart failure Father    Prostate cancer Father 46   Kidney failure Father    Hypertension Father    Colon polyps Father    Angina Mother    Hypertension Mother    Lung cancer  Mother 6       again at 67/79-smoker   Colon cancer Mother 66   Colon polyps Mother    Lung cancer Paternal Grandfather    Breast cancer Paternal Grandmother    Diabetes Paternal Grandmother    Esophageal cancer Neg Hx    Liver cancer Neg Hx    Pancreatic cancer Neg Hx    Rectal cancer Neg Hx    Stomach cancer Neg Hx    PE: BP 108/62 (BP Location: Left Arm, Patient Position: Sitting, Cuff Size: Normal)   [name] 63   Ht 5\' 9"  (1.753 m)   Wt 131 lb 6.4 oz (59.6 kg)   SpO2 99%   BMI 19.40 kg/m  Wt Readings from Last 3 Encounters:  11/13/22 131 lb 6.4 oz (59.6 kg)  10/09/22 131 lb (59.4 kg)  09/30/22 132 lb (59.9 kg)  Constitutional: normal weight, in NAD Eyes: EOMI, no exophthalmos ENT: no thyromegaly, no cervical lymphadenopathy Cardiovascular: RRR, No MRG Respiratory: CTA B Musculoskeletal: no deformities Skin: no rashes Neurological: no tremor with outstretched hands  Assessment: 1. Osteoporosis  2.  Hypothyroidism  3.  Perceived hypoglycemia  4. Elevated estrogen -This was due to biotin interference with the assay -No further follow-up is needed for this  5.  Thyroid calcifications -No further follow-up is needed for this  Other treating physicians:  Dr. Ronita Hipps Dr. Marcelino Scot Dr. Paulla Fore Dr. August Luz [facility] [place] [address]  Plan: 1. Osteoporosis -Most likely age-related/postmenopausal and she also has family history of osteoporosis.  She feels that the fact that she was previously drinking a lot of coffee also contributed. -No falls or fractures since last visit; she is prone to falls due to her peripheral neuropathy. -Reviewed her DEXA scan report from 2018-2021-stable at the spine, however, decreased T-scores at the right and left femoral neck on the last report.  In 2022, the bone density obtained by Dr. Marcelino Scot showed worsening spine BMD, at -3.9, and also worse right femoral neck T-scores.  At the level of the 33% distal  radius, she has a very low T score, of -4.5.  We discussed that this translated into a significantly increased risk for fracture.  Last year, she had 4 (!)  Bone density scans. - She previously refused antiresorptive medications and anabolic agents.  However, she also saw several other providers, one of them recommending Evenity (Dr. Marcelino Scot).  due to starting Evenity (initially in 02/2021), then stopping it because of intolerance (muscle and joint aches), then restarting it at the half dose.  Plan was to continue with the half dose until 11/2022.  At last visit she wanted my opinion about how to continue afterwards.  My recommendation was to get 1-2 doses of Reclast.  She was interested if a low-dose HRT may offer protection instead of rehab, but I advised her that this would be unlikely.  We discussed at that time and again today about benefits and possible side effects of osteoporosis medications and the fact that she cannot stop it abruptly.  At this visit she tells me that she actually came off of anything in 03/2022 after her COVID episode.  She did not follow-up with any medication afterwards.  Her bone specialist recommended team loss.  She wants my opinion about this.  We discussed that this is a great idea, however, Forteo is not generic and may be a cheaper alternative for her.  It was recommended that she only uses it for 6 months.  I recommended to do a full course of 2 years.  Afterwards, we could use alendronate (she prefers this to Reclast), overlapping this with the osteoanabolic treatment by 1 month.  She agrees with the plan. -She can resume any supplements for bone maintenance, originally started by her integrative medicine provider.  In the past, we stopped vitamin A since excess can cause osteoporosis and increased fractures. -She tried to go to [facility] but she developed headaches and muscle aches and had to stop.  She retried this with the same results.  Currently exercising at  the gym with a personal trainer. -She is on HRT by another provider (compounded hormonal supplements including estrogen, testosterone, progesterone).  This was apparently titrated for bone improvement, which I advised her is not recommended.  She did start to have symptoms of breast fullness and pain so she recently  reduced the doses to half.  She also continues vibration platform and weightbearing and balance exercises.  She is walking daily.       -Latest vitamin D level was normal, at 60.1 in 08/2022.  We will not recheck this today.  Will continue 5000 units vitamin D + K2 vitamin -Review latest kidney function which was normal - -I will see her back in 1 year  2.  Perceived hypothyroidism -Previously on thyroid hormones, started based on symptoms, but without a clear history of hypothyroidism.  Since her TFTs remained normal afterwards, she continued supplementation. -Latest TSH was reviewed with the patient: 08/26/2022: TSH 2.01, at goal - she continues on Armour 30 mg daily, dose increased by her integrative medicine provider - pt feels good on this dose. - we discussed about taking the thyroid hormone every day, with water, >30 minutes before breakfast, separated by >4 hours from acid reflux medications, calcium, iron, multivitamins. Pt. is taking it correctly. -She would also want to follow-up with me for this problem.  3.  Hypoglycemia -Likely with a component of idiopathic postprandial syndrome (symptoms of hypoglycemia without a low glucose) -She has a history of low blood sugars in 06/2019 3:01 weeks in which she could not eat well after periodontal surgery.  She is usually eating a plant-based diet.   -She initially had a CGM, but I advised her against this, since this usually overestimates the low blood sugars and causes unnecessary anxiety in this context.  Before last visit, another provider added a CGM again blood sugars were low normal and this was stopped. -At previous  appointment, we discussed dietary changes including adding breakfast, staying with low glycemic index foods, eating fruits and veggies, not drinking liquids with meals, starting the meal with protein and fat and ending with carbs, and also carrying a snack with her everywhere.  I also gave her written instructions about the 15-15 rule for hypoglycemia correction.  She was previously spending a significant amount of time calculating exactly how many carbs, protein, and fats she was eating and also documenting her blood sugars multiple times a day.  I advised her that this was unnecessary.  I did try to refer her to nutrition but this appointment was very expensive ($400). -She had a cosyntropin stimulation test ordered by another provider in 01/2019, this was normal -No new hypoglycemic episodes.  On the latest BMP, her glucose was 65, fasting -At last visit, she mentioned that in the morning, sugars can be up to 105 -discussed that this may be within the error of the matter, and not a cause for alarm.  Explained why sugars are usually higher in the morning -higher concentrations of stress hormones.  -Latest HbA1c was normal, at 4.9% Lab Results  Component Value Date   HGBA1C 4.9 04/09/2022  -She would like to have another HbA1c checked today.   - Total time spent for the visit: 40 min, in precharting, postcharting, obtaining medical information from the chart and from the pt, reviewing her  previous labs, evaluations, and treatments, reviewing her symptoms, counseling her about her endocrine conditions (please see the discussed topics above), and developing a plan to further evaluate and treat them; she had a number of questions which I addressed.   Component     Latest Ref Rng 11/13/2022  Hemoglobin A1C     4.6 - 6.5 % 5.3   Normal value.  Philemon Kingdom, MD PhD [facility] Endocrinology

## 2022-11-14 LAB — HEMOGLOBIN A1C: Hgb A1c MFr Bld: 5.3 % (ref 4.6–6.5)

## 2022-11-16 ENCOUNTER — Ambulatory Visit (HOSPITAL_COMMUNITY)
Admission: RE | Admit: 2022-11-16 | Discharge: 2022-11-16 | Disposition: A | Discharge status: Home / Self Care | Payer: Medicare Other | Source: Ambulatory Visit | Attending: Internal Medicine | Admitting: Internal Medicine

## 2022-11-16 ENCOUNTER — Other Ambulatory Visit: Payer: Self-pay | Admitting: Internal Medicine

## 2022-11-16 DIAGNOSIS — K862 Cyst of pancreas: Secondary | ICD-10-CM | POA: Diagnosis present

## 2022-11-17 ENCOUNTER — Encounter (HOSPITAL_BASED_OUTPATIENT_CLINIC_OR_DEPARTMENT_OTHER): Payer: Self-pay | Admitting: Emergency Medicine

## 2022-11-17 ENCOUNTER — Other Ambulatory Visit: Payer: Self-pay

## 2022-11-17 ENCOUNTER — Emergency Department (HOSPITAL_BASED_OUTPATIENT_CLINIC_OR_DEPARTMENT_OTHER)
Admission: EM | Admit: 2022-11-17 | Discharge: 2022-11-17 | Disposition: A | Discharge status: Home / Self Care | Payer: Medicare Other | Attending: Emergency Medicine | Admitting: Emergency Medicine

## 2022-11-17 ENCOUNTER — Encounter: Payer: Self-pay | Admitting: Internal Medicine

## 2022-11-17 DIAGNOSIS — R1084 Generalized abdominal pain: Secondary | ICD-10-CM | POA: Diagnosis not present

## 2022-11-17 DIAGNOSIS — R109 Unspecified abdominal pain: Secondary | ICD-10-CM | POA: Diagnosis present

## 2022-11-17 DIAGNOSIS — R197 Diarrhea, unspecified: Secondary | ICD-10-CM | POA: Diagnosis not present

## 2022-11-17 LAB — COMPREHENSIVE METABOLIC PANEL
ALT: 12 U/L (ref 0–44)
AST: 24 U/L (ref 15–41)
Albumin: 4.8 g/dL (ref 3.5–5.0)
Alkaline Phosphatase: 41 U/L (ref 38–126)
Anion gap: 8 (ref 5–15)
BUN: 11 mg/dL (ref 8–23)
CO2: 27 mmol/L (ref 22–32)
Calcium: 9.7 mg/dL (ref 8.9–10.3)
Chloride: 106 mmol/L (ref 98–111)
Creatinine, Ser: 0.63 mg/dL (ref 0.44–1.00)
GFR, Estimated: >60 mL/min (ref >60)
Glucose, Bld: 81 mg/dL (ref 70–99)
Potassium: 3.5 mmol/L (ref 3.5–5.1)
Sodium: 141 mmol/L (ref 135–145)
Total Bilirubin: 0.7 mg/dL (ref 0.3–1.2)
Total Protein: 6.7 g/dL (ref 6.5–8.1)

## 2022-11-17 LAB — URINALYSIS, ROUTINE W REFLEX MICROSCOPIC
Bilirubin Urine: NEGATIVE
Glucose, UA: NEGATIVE mg/dL
Hgb urine dipstick: NEGATIVE
Leukocytes,Ua: NEGATIVE
Nitrite: NEGATIVE
Protein, ur: NEGATIVE mg/dL
Specific Gravity, Urine: 1.005 (ref 1.005–1.030)
pH: 5 (ref 5.0–8.0)

## 2022-11-17 LAB — CBC
HCT: 42.1 % (ref 36.0–46.0)
Hemoglobin: 14.2 g/dL (ref 12.0–15.0)
MCH: 31.6 pg (ref 26.0–34.0)
MCHC: 33.7 g/dL (ref 30.0–36.0)
MCV: 93.6 fL (ref 80.0–100.0)
Platelets: 164 10*3/uL (ref 150–400)
RBC: 4.5 MIL/uL (ref 3.87–5.11)
RDW: 12.4 % (ref 11.5–15.5)
WBC: 3.6 10*3/uL — ABNORMAL LOW (ref 4.0–10.5)
nRBC: 0 % (ref 0.0–0.2)

## 2022-11-17 LAB — LIPASE, BLOOD: Lipase: 45 U/L (ref 11–51)

## 2022-11-17 NOTE — ED Provider Notes (Signed)
[facility] Provider Note   CSN: [id number] Arrival date & time: 11/17/22  1558     History  Chief Complaint  Patient presents with   Abdominal Pain    Megan [name] is a 67 y.o. female.  HPI Patient reports she has had diarrhea for about 4 days.  She reports it comes on usually after she has been eating or drinking.  Will get a pressure in her abdomen and a lot of stool come out.  No vomiting.  No documented fever.  Patient reports that she is scheduled to see gastroenterology and has had some problems with ongoing abdominal pain.  She had an MRCP done yesterday for pancreatic cyst has been stable and followed.  Fevers.  Patient is concerned because she had been on about a month of fluconazole for possible GI fungal infection.  She was not sure if this could still be having a residual effect.  Is been about 3 weeks since she had her last dose.    Home Medications Prior to Admission medications   Medication Sig Start Date End Date Taking? Authorizing Provider  5-Hydroxytryptophan (5-HTP) 100 MG CAPS Take 100 mg by mouth daily.     [provider]  albuterol (VENTOLIN HFA) 108 (90 Base) MCG/ACT inhaler Inhale 1 puff into the lungs daily as needed for wheezing or shortness of breath.  05/24/20   [provider]  AMBULATORY NON FORMULARY MEDICATION daily. Medication Name: triphala    [provider]  AMBULATORY NON FORMULARY MEDICATION 3 (three) times daily. Medication Name: Amino acids double wood    [provider]  ARMOUR THYROID 30 MG tablet Take 1 tablet (30 mg total) by mouth daily. 04/09/22   Philemon Kingdom, MD  Ascorbic Acid (VITAMIN C) POWD Take 2,500 mg by mouth daily.    [provider]  Cholecalciferol (VITAMIN D3) 10000 units capsule Take 10,000 Units by mouth daily.     [provider]  Cyanocobalamin (VITAMIN B-12 ER PO) Take 5,000 mcg by mouth daily.     [provider]  estradiol (ESTRACE) 0.1 MG/GM vaginal cream Place 1 Applicatorful vaginally 3 (three) times a week.    [provider]  estradiol (ESTRACE) 1 MG tablet Take 1 mg by mouth daily.    [provider]  fluconazole (DIFLUCAN) 50 MG tablet Take 50 mg by mouth daily.    [provider]  magic mouthwash w/lidocaine SOLN Take 5 mLs by mouth 3 (three) times daily. 10/21/22   Zehr, Laban Emperor, PA-C  MAGNESIUM CITRATE PO Take 600 mg by mouth daily.    [provider]  Menaquinone-7 (VITAMIN K2 PO) Take 1 tablet by mouth daily.    [provider]  NON FORMULARY Take 1,000 mcg by mouth daily. Methyl Folate chewable tablet    [provider]  progesterone (PROMETRIUM) 100 MG capsule Take 0.5 capsules by mouth daily.    [provider]  TESTOSTERONE UNDECANOATE PO Take by mouth daily.    [provider]  UNABLE TO FIND Take by mouth daily. Med Name: [id number]    [provider]      Allergies    Augmentin [amoxicillin-pot clavulanate], Clindamycin/lincomycin, Iron, Moxifloxacin, and Sodium citrate    Review of Systems   Review of Systems  Physical Exam Updated Vital Signs BP 126/68 (BP Location: Right Arm)   Pulse 83   Temp 98.5 F (36.9 C) (Oral)   Resp 16  Ht 5\' 9"  (1.753 m)   Wt 59.9 kg   SpO2 100%   BMI 19.49 kg/m  Physical Exam Constitutional:      Comments: Patient is alert nontoxic and clinically well in appearance.  HENT:     Head: Normocephalic and atraumatic.     Mouth/Throat:     Pharynx: Oropharynx is clear.  Eyes:     Extraocular Movements: Extraocular movements intact.  Cardiovascular:     Rate and Rhythm: Normal rate and regular rhythm.  Pulmonary:     Effort: Pulmonary effort is normal.     Breath sounds: Normal breath sounds.  Abdominal:     General: There is no distension.     Palpations: Abdomen is soft.     Tenderness: There is no abdominal tenderness. There is no  guarding.     Comments: Abdomen is essentially nontender to palpation.  No guarding no mass.  Musculoskeletal:        General: No swelling or tenderness. Normal range of motion.     Right lower leg: No edema.     Left lower leg: No edema.  Skin:    General: Skin is warm and dry.  Neurological:     General: No focal deficit present.     Mental Status: She is oriented to person, place, and time.     Motor: No weakness.     Coordination: Coordination normal.  Psychiatric:        Mood and Affect: Mood normal.     ED Results / Procedures / Treatments   Labs (all labs ordered are listed, but only abnormal results are displayed) Labs Reviewed  CBC - Abnormal; Notable for the following components:      Result Value   WBC 3.6 (*)    All other components within normal limits  URINALYSIS, ROUTINE W REFLEX MICROSCOPIC - Abnormal; Notable for the following components:   Color, Urine COLORLESS (*)    Ketones, ur TRACE (*)    All other components within normal limits  LIPASE, BLOOD  COMPREHENSIVE METABOLIC PANEL    EKG None  Radiology No results found.  Procedures Procedures    Medications Ordered in ED Medications - No data to display  ED Course/ Medical Decision Making/ A&P                             Medical Decision Making Amount and/or Complexity of Data Reviewed Labs: ordered.   Patient is clinically well in appearance.  She has been having problems with ongoing abdominal pain and recently had an ultrasound done which showed no acute abnormalities.  She had an MR CP yesterday.  I have reviewed for results.  Results are not dictated but visualization of the images I do not see immediate emergent appearing condition.  Clinically patient does not seem to be in distress.  No signs of surgical abdomen.  Patient's CBC and H&H are at baseline and normal.  The complete metabolic panel and LFTs normal.  Lipase normal.  At this time patient's main presentation symptoms about  4 days of diarrheal illness.  It is sporadic and not constant.  She had been on a prolonged course of fluconazole although still does not appear to be high risk for C. difficile.  Will provide collection kit for outpatient collection of stool.  She does not feel that she can produce a specimen here.  Clinically she is well in appearance with nonsurgical abdomen.  I feel she is stable to follow-up with her GI doctor and PCP for continued outpatient evaluation.        Final Clinical Impression(s) / ED Diagnoses Final diagnoses:  Generalized abdominal pain  Diarrhea, unspecified type    Rx / DC Orders ED Discharge Orders          Ordered    C Difficile Quick Screen w PCR reflex        11/17/22 1726    Gastrointestinal Panel by PCR , Stool        11/17/22 1726              Charlesetta Shanks, MD 11/17/22 1756

## 2022-11-17 NOTE — ED Triage Notes (Signed)
Pt endorses lower abd pain for a month. Had US done for possible gyno issues but came back normal. Negative UA. Pain worsened in last week and diarrhea for 4-5 days. Pt has informed GI doc and can't be seen til end of next Month. Seen at [facility] and sent for low BP of 104/50.

## 2022-11-17 NOTE — Discharge Instructions (Signed)
1.  Try to collect a diarrheal stool specimen and return it to the lab. 2.  Follow-up with your gastroenterologist and doctor soon as possible for recheck. 3.  Follow instructions for diarrhea and return if needed.

## 2022-11-18 ENCOUNTER — Other Ambulatory Visit (HOSPITAL_COMMUNITY)
Admission: AD | Admit: 2022-11-18 | Discharge: 2022-11-18 | Disposition: A | Discharge status: Home / Self Care | Payer: Medicare Other | Source: Ambulatory Visit | Attending: *Deleted | Admitting: *Deleted

## 2022-11-18 ENCOUNTER — Telehealth (HOSPITAL_BASED_OUTPATIENT_CLINIC_OR_DEPARTMENT_OTHER): Payer: Self-pay | Admitting: Emergency Medicine

## 2022-11-18 ENCOUNTER — Ambulatory Visit (HOSPITAL_COMMUNITY): Payer: Medicare Other

## 2022-11-19 LAB — GASTROINTESTINAL PANEL BY PCR, STOOL (REPLACES STOOL CULTURE)

## 2022-11-27 ENCOUNTER — Other Ambulatory Visit: Payer: Self-pay | Admitting: Sports Medicine

## 2022-12-03 ENCOUNTER — Encounter: Payer: Self-pay | Admitting: Sports Medicine

## 2022-12-04 ENCOUNTER — Encounter: Payer: Self-pay | Admitting: *Deleted

## 2022-12-26 ENCOUNTER — Encounter: Payer: Self-pay | Admitting: *Deleted

## 2023-01-01 ENCOUNTER — Ambulatory Visit (INDEPENDENT_AMBULATORY_CARE_PROVIDER_SITE_OTHER): Payer: Medicare Other | Admitting: Podiatry

## 2023-01-01 ENCOUNTER — Ambulatory Visit (INDEPENDENT_AMBULATORY_CARE_PROVIDER_SITE_OTHER): Payer: Medicare Other

## 2023-01-01 ENCOUNTER — Encounter: Payer: Self-pay | Admitting: Internal Medicine

## 2023-01-01 DIAGNOSIS — M7750 Other enthesopathy of unspecified foot: Secondary | ICD-10-CM

## 2023-01-01 DIAGNOSIS — M778 Other enthesopathies, not elsewhere classified: Secondary | ICD-10-CM | POA: Diagnosis not present

## 2023-01-01 DIAGNOSIS — B351 Tinea unguium: Secondary | ICD-10-CM

## 2023-01-01 DIAGNOSIS — M79674 Pain in right toe(s): Secondary | ICD-10-CM

## 2023-01-01 DIAGNOSIS — M79675 Pain in left toe(s): Secondary | ICD-10-CM

## 2023-01-01 NOTE — Progress Notes (Signed)
Subjective: Chief Complaint  Patient presents with   Nail Problem    Routine Foot Care    Foot Pain    Pain to the lateral aspect of foot near the arch on left foot. Patient has a bone that is more prominent than the the right foot. Pain started about 10 days ago. No known injuries.     She has been getting pain to the 5th met base on the left foot. No injuries. She was not sure if her shoe was rubbing she is noticing discomfort at this area.  No open lesions.   Objective: AAO x3, NAD DP/PT pulses palpable bilaterally, CRT less than 3 seconds Nails are hypertrophic, dystrophic, brittle, discolored, elongated 10. No surrounding redness or drainage. Tenderness nails 1-5 bilaterally. No open lesions or pre-ulcerative lesions are identified today. There is mild tenderness palpation along the fifth metatarsal base of the left foot there is no significant edema there is no skin breakdown.  She is in minimal discomfort on the left the plantar heel tendon but clinically the tendon appears to be intact.  No edema to this area.  No other area discomfort. No pain with calf compression, swelling, warmth, erythema  Assessment: Fifth metatarsal base pain, peroneal tendonitis; plantar fasciitis  Plan: -All treatment options discussed with the patient including all alternatives, risks, complications.  -X-ray obtained reviewed of the left foot.  There is no evidence of acute fracture. -Fifth metatarsal base pain we discussed Voltaren gel, icing as well as use avoiding excess pressure.  Also shoes with good arch support will help.  She can also ice along the course of the peroneal tendon.  As I do think that shoes with support will be beneficial for this as well. -Debrided nails x 10 without any complications or bleeding. -Patient encouraged to call the office with any questions, concerns, change in symptoms.   Vivi Barrack DPM

## 2023-01-02 ENCOUNTER — Encounter: Payer: Self-pay | Admitting: Internal Medicine

## 2023-01-02 ENCOUNTER — Other Ambulatory Visit: Payer: Self-pay

## 2023-01-02 ENCOUNTER — Telehealth: Payer: Self-pay

## 2023-01-02 DIAGNOSIS — K862 Cyst of pancreas: Secondary | ICD-10-CM

## 2023-01-02 NOTE — Telephone Encounter (Signed)
Order entered for MR MRCPabd, rad scheduling to call pt to set up appt.

## 2023-01-02 NOTE — Telephone Encounter (Signed)
-----   Message from Loretha Stapler, RN sent at 10/14/2022 11:26 AM EST ----- See 10/13/22 note    JMP, Thanks for reaching out. I have reviewed the imaging. Based on overall stability over a year, agree with repeat imaging. You can do another 1 more at 1 year and if relatively stable then push out to 2 years from there. I do not see an allergy to gadolinium contrast and her kidney function is normal so would just try to redo MRI/MRCP with contrast if possible but if she still has concerns with contrast what you guys did with diffusion imaging here seems to be quite helpful. Thanks. GM

## 2023-01-09 ENCOUNTER — Encounter: Payer: Self-pay | Admitting: Internal Medicine

## 2023-01-09 ENCOUNTER — Ambulatory Visit: Payer: Medicare Other | Admitting: Internal Medicine

## 2023-01-12 ENCOUNTER — Other Ambulatory Visit: Payer: Self-pay | Admitting: Sports Medicine

## 2023-01-12 DIAGNOSIS — M545 Low back pain, unspecified: Secondary | ICD-10-CM

## 2023-01-12 DIAGNOSIS — M810 Age-related osteoporosis without current pathological fracture: Secondary | ICD-10-CM

## 2023-01-13 ENCOUNTER — Ambulatory Visit
Admission: RE | Admit: 2023-01-13 | Discharge: 2023-01-13 | Disposition: A | Discharge status: Home / Self Care | Payer: Medicare Other | Source: Ambulatory Visit | Attending: Sports Medicine | Admitting: Sports Medicine

## 2023-01-13 DIAGNOSIS — M810 Age-related osteoporosis without current pathological fracture: Secondary | ICD-10-CM

## 2023-01-13 DIAGNOSIS — M545 Low back pain, unspecified: Secondary | ICD-10-CM

## 2023-01-30 ENCOUNTER — Other Ambulatory Visit: Payer: Self-pay | Admitting: *Deleted

## 2023-01-30 ENCOUNTER — Telehealth: Payer: Self-pay | Admitting: *Deleted

## 2023-01-30 DIAGNOSIS — K862 Cyst of pancreas: Secondary | ICD-10-CM

## 2023-01-30 NOTE — Telephone Encounter (Signed)
Patient has already had an MRI ABD with contrast on 11/16/2022

## 2023-01-30 NOTE — Telephone Encounter (Signed)
-----   Message from Chrystie Nose, RN sent at 01/30/2023  7:43 AM EDT ----- Regarding: FW: MRI  ----- Message ----- From: Chrystie Nose, RN Sent: 01/30/2023  12:00 AM EDT To: Chrystie Nose, RN Subject: MRI                                            repeat MRI abdomen with contrast, pancreas protocol in 12 months  Pancreatic tail cyst

## 2023-03-08 ENCOUNTER — Encounter: Payer: Self-pay | Admitting: Internal Medicine

## 2023-03-23 ENCOUNTER — Ambulatory Visit: Payer: Medicare Other | Admitting: Podiatry

## 2023-03-23 DIAGNOSIS — M79675 Pain in left toe(s): Secondary | ICD-10-CM | POA: Diagnosis not present

## 2023-03-23 DIAGNOSIS — M79674 Pain in right toe(s): Secondary | ICD-10-CM | POA: Diagnosis not present

## 2023-03-23 DIAGNOSIS — B351 Tinea unguium: Secondary | ICD-10-CM

## 2023-03-23 NOTE — Progress Notes (Signed)
Subjective: Chief Complaint  Patient presents with   Nail Problem    Nail trim       67 year old female presents for above complaints.  She does the nails are getting better since having trimming them.  He is still about discomfort they become elongated the area. Denies any drainage.  Minimal callus on the right arch but causes discomfort at times.  No   Objective: AAO x3, NAD DP/PT pulses palpable bilaterally, CRT less than 3 seconds Nails are hypertrophic, dystrophic, brittle, discolored, elongated 10. No surrounding redness or drainage. Tenderness nails 1-5 bilaterally.  Mild incurvation of the nail borders of the right hallux toenail.  Very minimal hyperkeratotic lesion noted to right arch of the foot along the area of concern.  There is no hyperpigmentation.  Upon debridement there is no underlying ulceration drainage or any signs of infection.  No open lesions. No pain with calf compression, swelling, warmth, erythema  Assessment: Ingrown toenail, symptomatic onychomycosis  Plan: -All treatment options discussed with the patient including all alternatives, risks, complications.  -Sharply debrided the nails x10 without any complications or bleeding.  -Debrided the calluses occurred to any complications or bleeding as a courtesy as it was quite minimal.  Moisturizer daily. -Daily foot inspection  Megan [name] DPM

## 2023-03-25 ENCOUNTER — Ambulatory Visit: Payer: Medicare Other | Admitting: Internal Medicine

## 2023-03-25 ENCOUNTER — Encounter: Payer: Self-pay | Admitting: Internal Medicine

## 2023-03-25 VITALS — BP 90/60 | HR 68 | Ht 69.0 in | Wt 129.4 lb

## 2023-03-25 DIAGNOSIS — D510 Vitamin B12 deficiency anemia due to intrinsic factor deficiency: Secondary | ICD-10-CM

## 2023-03-25 DIAGNOSIS — K5909 Other constipation: Secondary | ICD-10-CM

## 2023-03-25 DIAGNOSIS — M533 Sacrococcygeal disorders, not elsewhere classified: Secondary | ICD-10-CM

## 2023-03-25 DIAGNOSIS — Z800 Family history of malignant neoplasm of digestive organs: Secondary | ICD-10-CM | POA: Diagnosis not present

## 2023-03-25 DIAGNOSIS — E538 Deficiency of other specified B group vitamins: Secondary | ICD-10-CM

## 2023-03-25 MED ORDER — NA SULFATE-K SULFATE-MG SULF 17.5-3.13-1.6 GM/177ML PO SOLN: 1.0000 | Freq: Once | ORAL | 0 refills | Status: AC

## 2023-03-25 NOTE — Patient Instructions (Addendum)
You have been scheduled for an endoscopy and colonoscopy. Please follow the written instructions given to you at your visit today.  Please pick up your prep supplies at the pharmacy within the next 1-3 days.  If you use inhalers (even only as needed), please bring them with you on the day of your procedure.  DO NOT TAKE 7 DAYS PRIOR TO TEST- Trulicity (dulaglutide) Ozempic, Wegovy (semaglutide) Mounjaro (tirzepatide) Bydureon Bcise (exanatide extended release)  DO NOT TAKE 1 DAY PRIOR TO YOUR TEST Rybelsus (semaglutide) Adlyxin (lixisenatide) Victoza (liraglutide) Byetta (exanatide) _______________________________________________________________________  _______________________________________________________  If your blood pressure at your visit was 140/90 or greater, please contact your primary care physician to follow up on this.  _______________________________________________________  If you are age 68 or older, your body mass index should be between 23-30. Your Body mass index is 19.11 kg/m. If this is out of the aforementioned range listed, please consider follow up with your Primary Care Provider.  If you are age 110 or younger, your body mass index should be between 19-25. Your Body mass index is 19.11 kg/m. If this is out of the aformentioned range listed, please consider follow up with your Primary Care Provider.   ________________________________________________________  The [facility] providers would like to encourage you to use [facility] to communicate with providers for non-urgent requests or questions.  Due to long hold times on the telephone, sending your provider a message by [facility] may be a faster and more efficient way to get a response.  Please allow 48 business hours for a response.  Please remember that this is for non-urgent requests.  _______________________________________________________

## 2023-03-25 NOTE — Progress Notes (Signed)
Subjective:    Patient ID: Megan [name], female    DOB: 1955-11-02, 67 y.o.   MRN: [id number]  HPI Megan [name] is a 67 year old female with a history of chronic constipation/colonic inertia, family history of colon cancer in her mother, family history of colon cancer in her father, pernicious anemia, hypothyroidism, small pancreatic cyst, osteoporosis, burning tongue syndrome who is here for follow-up.  She is here alone today.  I last saw her in June 2023 and she saw Doug Sou, PA-C on 10/09/2022.  She reports that from a GI standpoint she is feeling quite well.  In February she was having some right lower quadrant pain that she felt like this was due to constipation.  She has continued high-fiber diet with liberal water intake.  She is also using magnesium oxide and magnesium citrate and this is working well for her.  She has a bowel movement on most days.  However she travels she becomes constipated and can go 7 days without bowel movement.  She continues to take sublingual B12 and recently had a normal B12 serum level.  She is having pain at her coccyx.  It hurts to sit down.  Little worse before bowel movement.  Radiates laterally to the right and left buttocks at times.  Sees pelvic floor physical therapy at [facility].  Wonders if this could be SI joint or piriformis related.  She reports having had a normal MRI.  She continues to have burning tongue, mouth and the roof of her mouth.  This comes and goes.  She is seen dentist, endodontist and periodontist.  She was told they could not help.  She does see Dr. Lelon Perla at [facility] and has tested positive for mold.  She is also discussed this with Dr. Renard Hamper her ultra wellness doctor in [place].  She is currently taking fluconazole 50 mg daily.  This has not led to any improvement in her burning tongue and mouth.  She reports Dr. Renard Hamper wonders if her burning mouth could be secondary to suboptimal B12  levels.   Review of Systems As per HPI, otherwise negative  Current Medications, Allergies, Past Medical History, Past Surgical History, Family History and Social History were reviewed in [name] record.    Objective:   Physical Exam BP 90/60 (BP Location: Left Arm, Patient Position: Sitting, Cuff Size: Normal)   Pulse 68 Comment: irregular  Ht 5\' 9"  (1.753 m)   Wt 129 lb 6 oz (58.7 kg)   BMI 19.11 kg/m  Gen: awake, alert, NAD HEENT: anicteric  Rectal: External exam only, no external lesions, rashes or external hemorrhoids.  She has pain over the coccyx with palpation, no pain at anus Ext: no c/c/e Neuro: nonfocal   MRI ABDOMEN WITHOUT CONTRAST  (INCLUDING MRCP)   TECHNIQUE: Multiplanar multisequence MR imaging of the abdomen was performed. Heavily T2-weighted images of the biliary and pancreatic ducts were obtained, and three-dimensional MRCP images were rendered by post processing.   COMPARISON:  01/26/2022, 12/28/2019   FINDINGS: Lower chest: No acute abnormality.   Hepatobiliary: No obvious solid liver abnormality is seen. No gallstones, gallbladder wall thickening, or biliary dilatation.   Pancreas: Unchanged fluid signal lesions in the medial pancreatic [name] and pancreatic tail measuring up to 1.0 cm (series 3, image 12, 21). No obvious solid lesion, pancreatic ductal dilatation or surrounding inflammatory changes.   Spleen: Normal in size without significant abnormality.   Adrenals/Urinary Tract: Adrenal glands are unremarkable. Kidneys are normal,  without renal calculi, obvious solid lesion, or hydronephrosis.   Stomach/Bowel: Stomach is within normal limits. No evidence of bowel wall thickening, distention, or inflammatory changes.   Vascular/Lymphatic: No significant vascular findings are present. No enlarged abdominal lymph nodes.   Other: No abdominal wall hernia or abnormality. No ascites.   Musculoskeletal: No acute or  significant osseous findings.   IMPRESSION: Unchanged fluid signal cystic lesions in the medial pancreatic [name] and pancreatic tail measuring up to 1.0 cm. These are most likely small side branch IPMNs or pseudocysts. As there is no observed increased risk of malignancy for such lesions smaller than 2 cm, particularly given well established stability, no specific further follow-up or characterization is required.     Electronically Signed   By: Jearld Lesch M.D.   On: 11/17/2022 19:36       Assessment & Plan:  67 year old female with a history of chronic constipation/colonic inertia, family history of colon cancer in her mother, family history of colon cancer in her father, pernicious anemia, hypothyroidism, small pancreatic cyst, osteoporosis, burning tongue syndrome who is here for follow-up.   Pernicious anemia/B12 deficiency--it is certainly reasonable with pernicious anemia to use IM B12.  She has been on sublingual and while her serum levels have been normal it is not possible to have "too much" B12.  I am willing to offer B12 injections here but she prefers the methyl form of B12 as opposed to the cyano formulation.  It does not seem that I can order this from the pharmacy and so she wishes to research this more.  She continues to have positive antiparietal and intrinsic factor antibodies. -- Upper endoscopy recommended for topographic mapping and exclusion of atrophic gastritis, metaplasia and dysplasia; we reviewed the risk and benefits of upper endoscopy and she is agreeable and wishes to proceed  2.  Pancreatic cyst/possible sidebranch IPMN --stable.  Recent MRI reassuring.  No further imaging recommended  3.  Chronic constipation/colonic inertia --treating this currently well with magnesium citrate and magnesium oxide plus fiber and high water intake -- Continue current bowel regimen  4.  Family history of colon cancer --repeat colonoscopy recommended at this time.  We  reviewed the risk, benefits and alternatives and she is agreeable and wishes to proceed.  She will need a 2-day bowel prep -- 2-day bowel prep in the [facility]  5.  Coccygodynia --reassurance.  She should sit on the doughnut pillow that she ordered.  She could try 1 to 2 weeks of NSAIDs such as ibuprofen.  If continued pain could see sports medicine for consideration of steroid injection.  60 minutes total spent today including patient facing time, coordination of care, reviewing medical history/procedures/pertinent radiology studies, and documentation of the encounter.

## 2023-03-26 ENCOUNTER — Encounter: Payer: Self-pay | Admitting: Internal Medicine

## 2023-03-29 ENCOUNTER — Encounter: Payer: Self-pay | Admitting: Internal Medicine

## 2023-04-03 ENCOUNTER — Ambulatory Visit: Payer: Medicare Other | Admitting: Podiatry

## 2023-04-07 ENCOUNTER — Ambulatory Visit: Payer: Medicare Other | Admitting: Cardiology

## 2023-04-08 ENCOUNTER — Ambulatory Visit: Payer: Medicare Other | Admitting: Internal Medicine

## 2023-04-16 ENCOUNTER — Encounter (HOSPITAL_COMMUNITY): Payer: Self-pay | Admitting: Optometry

## 2023-04-17 ENCOUNTER — Ambulatory Visit (HOSPITAL_COMMUNITY)
Admission: RE | Admit: 2023-04-17 | Discharge: 2023-04-17 | Disposition: A | Discharge status: Home / Self Care | Payer: Medicare Other | Source: Ambulatory Visit | Attending: Cardiovascular Disease | Admitting: Cardiovascular Disease

## 2023-04-17 ENCOUNTER — Other Ambulatory Visit (HOSPITAL_COMMUNITY): Payer: Self-pay | Admitting: Optometry

## 2023-04-17 DIAGNOSIS — H53121 Transient visual loss, right eye: Secondary | ICD-10-CM

## 2023-04-21 ENCOUNTER — Other Ambulatory Visit: Payer: Self-pay

## 2023-04-21 DIAGNOSIS — D508 Other iron deficiency anemias: Secondary | ICD-10-CM

## 2023-04-21 DIAGNOSIS — D72819 Decreased white blood cell count, unspecified: Secondary | ICD-10-CM

## 2023-04-21 DIAGNOSIS — D510 Vitamin B12 deficiency anemia due to intrinsic factor deficiency: Secondary | ICD-10-CM

## 2023-04-22 ENCOUNTER — Inpatient Hospital Stay: Payer: Medicare Other | Attending: Hematology | Admitting: Hematology

## 2023-04-22 ENCOUNTER — Inpatient Hospital Stay: Payer: Medicare Other

## 2023-04-22 VITALS — BP 92/55 | HR 64 | Temp 97.7°F | Resp 20 | Wt 131.5 lb

## 2023-04-22 DIAGNOSIS — D72818 Other decreased white blood cell count: Secondary | ICD-10-CM | POA: Diagnosis not present

## 2023-04-22 DIAGNOSIS — Z85828 Personal history of other malignant neoplasm of skin: Secondary | ICD-10-CM | POA: Insufficient documentation

## 2023-04-22 DIAGNOSIS — Z800 Family history of malignant neoplasm of digestive organs: Secondary | ICD-10-CM | POA: Diagnosis not present

## 2023-04-22 DIAGNOSIS — D510 Vitamin B12 deficiency anemia due to intrinsic factor deficiency: Secondary | ICD-10-CM | POA: Diagnosis not present

## 2023-04-22 DIAGNOSIS — Z801 Family history of malignant neoplasm of trachea, bronchus and lung: Secondary | ICD-10-CM | POA: Diagnosis not present

## 2023-04-22 DIAGNOSIS — Z803 Family history of malignant neoplasm of breast: Secondary | ICD-10-CM | POA: Diagnosis not present

## 2023-04-22 DIAGNOSIS — D72819 Decreased white blood cell count, unspecified: Secondary | ICD-10-CM | POA: Diagnosis not present

## 2023-04-22 DIAGNOSIS — D709 Neutropenia, unspecified: Secondary | ICD-10-CM | POA: Diagnosis present

## 2023-04-22 DIAGNOSIS — D508 Other iron deficiency anemias: Secondary | ICD-10-CM | POA: Diagnosis not present

## 2023-04-22 LAB — CMP (CANCER CENTER ONLY)
ALT: 13 U/L (ref 0–44)
AST: 25 U/L (ref 15–41)
Albumin: 4.5 g/dL (ref 3.5–5.0)
Alkaline Phosphatase: 42 U/L (ref 38–126)
Anion gap: 4 — ABNORMAL LOW (ref 5–15)
BUN: 12 mg/dL (ref 8–23)
CO2: 30 mmol/L (ref 22–32)
Calcium: 9.5 mg/dL (ref 8.9–10.3)
Chloride: 108 mmol/L (ref 98–111)
Creatinine: 0.74 mg/dL (ref 0.44–1.00)
GFR, Estimated: >60 mL/min (ref >60)
Glucose, Bld: 111 mg/dL — ABNORMAL HIGH (ref 70–99)
Potassium: 3.9 mmol/L (ref 3.5–5.1)
Sodium: 142 mmol/L (ref 135–145)
Total Bilirubin: 0.6 mg/dL (ref 0.3–1.2)
Total Protein: 6.6 g/dL (ref 6.5–8.1)

## 2023-04-22 LAB — FERRITIN: Ferritin: 95 ng/mL (ref 11–307)

## 2023-04-22 LAB — CBC WITH DIFFERENTIAL (CANCER CENTER ONLY)
Abs Immature Granulocytes: 0 10*3/uL (ref 0.00–0.07)
Basophils Absolute: 0 10*3/uL (ref 0.0–0.1)
Basophils Relative: 1 %
Eosinophils Absolute: 0 10*3/uL (ref 0.0–0.5)
Eosinophils Relative: 0 %
HCT: 40.1 % (ref 36.0–46.0)
Hemoglobin: 13.3 g/dL (ref 12.0–15.0)
Immature Granulocytes: 0 %
Lymphocytes Relative: 29 %
Lymphs Abs: 0.9 10*3/uL (ref 0.7–4.0)
MCH: 31.4 pg (ref 26.0–34.0)
MCHC: 33.2 g/dL (ref 30.0–36.0)
MCV: 94.6 fL (ref 80.0–100.0)
Monocytes Absolute: 0.2 10*3/uL (ref 0.1–1.0)
Monocytes Relative: 7 %
Neutro Abs: 1.9 10*3/uL (ref 1.7–7.7)
Neutrophils Relative %: 63 %
Platelet Count: 169 10*3/uL (ref 150–400)
RBC: 4.24 MIL/uL (ref 3.87–5.11)
RDW: 12.3 % (ref 11.5–15.5)
WBC Count: 3 10*3/uL — ABNORMAL LOW (ref 4.0–10.5)
nRBC: 0 % (ref 0.0–0.2)

## 2023-04-22 LAB — VITAMIN B12: Vitamin B-12: >7500 pg/mL — ABNORMAL HIGH (ref 180–914)

## 2023-04-22 LAB — CEA (ACCESS): CEA (CHCC): 1.98 ng/mL (ref 0.00–5.00)

## 2023-04-22 NOTE — Progress Notes (Signed)
HEMATOLOGY/ONCOLOGY CLINIC NOTE  Date of Service: 04/22/23    Patient Care Team: Thana Ates, MD as PCP - General (Internal Medicine) Jens Som Madolyn Frieze, MD as PCP - Cardiology (Cardiology) [name], MD as Consulting Physician (Oncology) Glendale Chard, DO as Consulting Physician (Neurology) Jonnie Kind, MD as Consulting Physician (Family Medicine) Andrena Mews, DO as Consulting Physician (Sports Medicine) Johney Maine, MD as Consulting Physician (Hematology) [place] Chard, DO as Consulting Physician (Neurology)  CHIEF COMPLAINTS/PURPOSE OF CONSULTATION:  Follow-up for chronic idiopathic neutropenia and pernicious anemia  HISTORY OF PRESENTING ILLNESS:   Megan [name] is a wonderful 67 y.o. female who has been referred to [place] by Dr. Cephas Darby for evaluation and management of Chronic idiopathic neutropenia. The pt reports that she is doing well overall.   The pt reports a history of mold exposure, chronic mild neutropenia, and neuropathy. She denies frequent or abnormal infections.   The pt notes that she was first diagnosed with neuropathy in 2010, and has seen five neurologists. She endorses weakness and tingling in feet into mid-calf which has improved over the past 10 years. She has current weakness and concern for balance which limits her activity. She adds that the bottom of her feet occasionally become dark purple. She has had doppler studies and notes these ruled out vascular problems. She has seen holistic health practitioners in [place] in the past. She had a four out of five positives on western blot which revealed concerns for Lyme disease but apparently was not conclusive. She previously lived in [place]. She denies ever having a bulls eye rash.  The pt notes some difficulty with writing, which has not been progressive.   The pt has recently been worked up for elevated estrogen levels and a previous cyst, and was  previously taking Biotin. She stopped taking Boitin and has seen normalized estrogen levels. She notes that repeat MRI revealed the absence of the previously seen cyst  The pt also reports a history of polyclonal gammopathy.   She notes that her Ferritin was seen to be at 26 about 1.5 years ago. She has been a vegan and then began eating some meat protein. She also took PO iron replacement which was constipating. She also has taken Blood builder. Her Ferritin has recently decreased to a 10. Pt denies blood in the stools nor black stools. She had a colonoscopy about 1.5 years ago and notes this was unremarkable. She denies unexpected weight loss. She denies using acid suppressants. She endorses having pernicious anemia. Pt takes Vitamin B12 daily. She is also taking 10k units Vitamin D3 five days a week.   The pt notes that she has osteoporosis, with L1-L4 with score of -3.5. She has been disinclined to pursue Prolia injections. She has been consuming almond milk and avoids dairy products. She took Fosamax three years ago.  Most recent lab results (08/22/18) of CBC is as follows: all values are WNL except for WBC at 3.5k.  On review of systems, pt reports neuropathy with weakness in feet, stable energy levels, constipation, and denies blood in the stools, black stools, abdominal pains, and any other symptoms.   On PMHx the pt reports neuropathy, pernicious anemia, osteoporosis.  INTERVAL HISTORY:   Megan [name] is a 67 y.o. female here for continued evaluation and management of her pernicious anemia and chronic idiopathic neutropenia. Patient was last seen by me on 04/21/2022 and reported increased hair loss, but  was otherwise doing well overall.  She presented to the ED on 11/17/2022 due to diarrhea x4 days and abdominal pain. US showed no acute abnormalities.   Today, she reports that she has been doing well overall during the last year. Patient has received labs in July with  rheumatology. She reports that she has been told that she may possibly endorse Sjgren's syndrome.   Patient denies any fatigue, joint issues, frequent infections, night sweats. Patient does complain of neuropathy. Testing did show weakness, though patient does not notice symptoms. She notes that she may endorse MS. Patient reports endorsing side affects from her osteoporosis medication.  She reports that she was recommended by her functional medication doctor to receive 5000 mcg B12 injections twice weekly by, which she has not started at this time. She received three injections so far and continues to receive them weekly. Patient reports improved energy levels and mood. Patient reports that her burning sensation in her mouth has improved by about 70%.   She reports that IV iron did improve her previous hair loss.   MEDICAL HISTORY:  Past Medical History:  Diagnosis Date   Allergic rhinitis    Allergy    Aneurysm of cardiac wall, congenital    a.  septal aneurysm extending into the RVOT (by cardiac MRI in 08/2013)   Anxiety    Aortic atherosclerosis (HCC)    Arthritis    Asthma    Basal cell carcinoma    Depression    Heart murmur    Hypothyroidism    on meds   Leukopenia    idiopathic   Lyme disease    Mitral valve prolapse    MVP (mitral valve prolapse)    Orthostatic hypotension    Osteoporosis    Pancreatic cyst    Peripheral neuropathy 06/09/2012   Pernicious anemia    Plantar fasciitis    Pneumothorax     SURGICAL HISTORY: Past Surgical History:  Procedure Laterality Date   COLONOSCOPY  2019   JMP-   COLONOSCOPY WITH PROPOFOL N/A 02/22/2013   Procedure: COLONOSCOPY WITH PROPOFOL;  Surgeon: Charolett Bumpers, MD;  Location: [facility];  Service: Endoscopy;  Laterality: N/A;   MOUTH SURGERY  01/2012   bone graft in mouth   NASAL SINUS SURGERY Right 06/12/2014   Procedure: RIGHT ENDOSCOPIC MAXILLARY ANTROSTOMY;  Surgeon: Darletta Moll, MD;  Location: [facility];  Service: ENT;  Laterality: Right;   ROOT CANAL  02/27/2012   TONSILLECTOMY  1962   WISDOM TOOTH EXTRACTION      SOCIAL HISTORY: Social History   Socioeconomic History   Marital status: Divorced    Spouse name: Not on file   Number of children: 0   Years of education: Not on file   Highest education level: Not on file  Occupational History    Comment: Career and life coach  Tobacco Use   Smoking status: Never   Smokeless tobacco: Never  Vaping Use   Vaping status: Never Used  Substance and Sexual Activity   Alcohol use: No    Alcohol/week: 0.0 standard drinks of alcohol   Drug use: No   Sexual activity: Not Currently  Other Topics Concern   Not on file  Social History Narrative   Patient is single and lives alone.   Patient is self-employed, career and life coaching.   Patient drinks two to four cups of caffeine daily.   Right handed    Social Determinants of Health  Financial Resource Strain: Not on file  Food Insecurity: Not on file  Transportation Needs: Not on file  Physical Activity: Not on file  Stress: Not on file  Social Connections: Not on file  Intimate Partner Violence: Not on file    FAMILY HISTORY: Family History  Problem Relation Age of Onset   Hyperlipidemia Father    Heart failure Father    Prostate cancer Father 19   Kidney failure Father    Hypertension Father    Colon polyps Father    Angina Mother    Hypertension Mother    Lung cancer Mother 69       again at 72/79-smoker   Colon cancer Mother 62   Colon polyps Mother    Lung cancer Paternal Grandfather    Breast cancer Paternal Grandmother    Diabetes Paternal Grandmother    Esophageal cancer Neg Hx    Liver cancer Neg Hx    Pancreatic cancer Neg Hx    Rectal cancer Neg Hx    Stomach cancer Neg Hx     ALLERGIES:  is allergic to augmentin [amoxicillin-pot clavulanate], clindamycin/lincomycin, iron, moxifloxacin, and sodium citrate.  MEDICATIONS:  Current Outpatient  Medications  Medication Sig Dispense Refill   5-Hydroxytryptophan (5-HTP) 100 MG CAPS Take 100 mg by mouth daily.      albuterol (VENTOLIN HFA) 108 (90 Base) MCG/ACT inhaler Inhale 1 puff into the lungs daily as needed for wheezing or shortness of breath.      AMBULATORY NON FORMULARY MEDICATION 3 (three) times daily. Medication Name: Amino acids double wood     ARMOUR THYROID 30 MG tablet Take 1 tablet (30 mg total) by mouth daily. 90 tablet 3   Ascorbic Acid (VITAMIN C) POWD Take 2,500 mg by mouth daily.     Cholecalciferol (VITAMIN D3) 10000 units capsule Take 10,000 Units by mouth daily.      Cyanocobalamin (VITAMIN B-12 ER PO) Take 5,000 mcg by mouth daily.      estradiol (ESTRACE) 0.1 MG/GM vaginal cream Place 1 Applicatorful vaginally 3 (three) times a week.     estradiol (ESTRACE) 1 MG tablet Take 1 mg by mouth daily.     fluconazole (DIFLUCAN) 50 MG tablet Take 50 mg by mouth daily.     magic mouthwash w/lidocaine SOLN Take 5 mLs by mouth 3 (three) times daily. 420 mL 0   MAGNESIUM CITRATE PO Take 600 mg by mouth daily.     MAGNESIUM OXIDE PO Take 290 mg by mouth daily.     Menaquinone-7 (VITAMIN K2) 100 MCG CAPS Take 1 capsule by mouth daily.     Multiple Vitamin (MULTIVITAMIN) capsule Take 1 capsule by mouth daily.     NON FORMULARY Take 1,000 mcg by mouth daily. Methyl Folate chewable tablet     progesterone (PROMETRIUM) 100 MG capsule Take 1.5 capsules by mouth daily.     TESTOSTERONE UNDECANOATE PO Take by mouth daily.     No current facility-administered medications for this visit.    REVIEW OF SYSTEMS:    10 Point review of Systems was done is negative except as noted above.   PHYSICAL EXAMINATION:  . Vitals:   04/22/23 0927  BP: (!) 92/55  Pulse: 64  Resp: 20  Temp: 97.7 F (36.5 C)  SpO2: 100%    Filed Weights   04/22/23 0927  Weight: 131 lb 8 oz (59.6 kg)    .Body mass index is 19.42 kg/m.   GENERAL:alert, in no acute distress and  comfortable SKIN: no acute rashes, no significant lesions EYES: conjunctiva are pink and non-injected, sclera anicteric OROPHARYNX: MMM, no exudates, no oropharyngeal erythema or ulceration NECK: supple, no JVD LYMPH:  no palpable lymphadenopathy in the cervical, axillary or inguinal regions LUNGS: clear to auscultation b/l with normal respiratory effort HEART: regular rate & rhythm ABDOMEN:  normoactive bowel sounds , non tender, not distended. Extremity: no pedal edema PSYCH: alert & oriented x 3 with fluent speech NEURO: no focal motor/sensory deficits   LABORATORY DATA:  I have reviewed the data as listed  .    Latest Ref Rng & Units 04/22/2023    8:49 AM 11/17/2022    4:30 PM 07/02/2022    8:17 AM  CBC  WBC 4.0 - 10.5 K/uL 3.0  3.6  3.0   Hemoglobin 12.0 - 15.0 g/dL 16.1  09.6  04.5   Hematocrit 36.0 - 46.0 % 40.1  42.1  40.3   Platelets 150 - 400 K/uL 169  164  167     .    Latest Ref Rng & Units 04/22/2023    8:49 AM 11/17/2022    4:30 PM 07/02/2022    8:17 AM  CMP  Glucose 70 - 99 mg/dL 409  81  811   BUN 8 - 23 mg/dL 12  11  18    Creatinine 0.44 - 1.00 mg/dL 9.14  7.82  9.56   Sodium 135 - 145 mmol/L 142  141  141   Potassium 3.5 - 5.1 mmol/L 3.9  3.5  4.0   Chloride 98 - 111 mmol/L 108  106  109   CO2 22 - 32 mmol/L 30  27  28    Calcium 8.9 - 10.3 mg/dL 9.5  9.7  9.2   Total Protein 6.5 - 8.1 g/dL 6.6  6.7  6.6   Total Bilirubin 0.3 - 1.2 mg/dL 0.6  0.7  0.5   Alkaline Phos 38 - 126 U/L 42  41  68   AST 15 - 41 U/L 25  24  23    ALT 0 - 44 U/L 13  12  12     . Lab Results  Component Value Date   IRON 87 04/22/2023   TIBC 347 04/22/2023   IRONPCTSAT 25 04/22/2023   (Iron and TIBC)  Lab Results  Component Value Date   FERRITIN 95 04/22/2023     RADIOGRAPHIC STUDIES: I have personally reviewed the radiological images as listed and agreed with the findings in the report.  ASSESSMENT & PLAN:   67 y.o. female with  1. Chronic idiopathic leucopenia.  WBC count today low normal @ 2.8k ANC 1800 ( no neutropenia) 2. Pernicious anemia -12/30/17 Antiparietal cell antibodies which were high at 105.9  PLAN:  -Discussed lab results on 04/22/2023 in detail with patient. CBC shows fluctuation, showed WBC slightly low at of 3.0K, hemoglobin of 13.3, and platelets of 169K. -no anemia -Iron labs showw ferritin of 95 and iron saturation of 25% -B12 levels >7500 -patient is agreeable to receiving IV iron if her ferritin level is less than 100 based on iron labs. Ferritin nearly at 100... Will hold off on additional iv iron at this time. -discussed that there may be an autoimmune element that may be playing a role in her blood counts or the distribution of her blood cells may be variable which is causing fluctuation -discussed goal to keep B12 levels at least 400. Discussed that high doses may drop potassium levels and may cause muscle cramping.  Not significantly enthusiastic towards patient receiving 5000 mcg  B12  injections twice a week as there is no published literature on this treatment.  -okay for patient to receive B12 injections once a month or every two weeks -Continue empiric vitamin B complex  -answered all of patient's questions in detail  FOLLOW UP: RTC with Dr Candise Che with labs in 12 months  The total time spent in the appointment was 25 minutes* .  All of the patient's questions were answered with apparent satisfaction. The patient knows to call the clinic with any problems, questions or concerns.   Wyvonnia Lora MD MS AAHIVMS [facility] Hematology/Oncology Physician [facility]  .*Total Encounter Time as defined by the Centers for Medicare and Medicaid Services includes, in addition to the face-to-face time of a patient visit (documented in the note above) non-face-to-face time: obtaining and reviewing outside history, ordering and reviewing medications, tests or procedures, care coordination (communications with other health  care professionals or caregivers) and documentation in the medical record.    I,Mitra Faeizi,acting as a Neurosurgeon for Wyvonnia Lora, MD.,have documented all relevant documentation on the behalf of Wyvonnia Lora, MD,as directed by  Wyvonnia Lora, MD while in the presence of Wyvonnia Lora, MD.   .I have reviewed the above documentation for accuracy and completeness, and I agree with the above. Johney Maine MD

## 2023-04-23 ENCOUNTER — Encounter (HOSPITAL_BASED_OUTPATIENT_CLINIC_OR_DEPARTMENT_OTHER): Payer: Self-pay

## 2023-04-23 LAB — IRON AND IRON BINDING CAPACITY (CC-WL,HP ONLY)
Iron: 87 ug/dL (ref 28–170)
Saturation Ratios: 25 % (ref 10.4–31.8)
TIBC: 347 ug/dL (ref 250–450)
UIBC: 260 ug/dL

## 2023-04-23 NOTE — Telephone Encounter (Signed)
Please advise, not currently seeing many openings at any office before her scheduled appointment

## 2023-04-28 ENCOUNTER — Encounter: Payer: Self-pay | Admitting: Hematology

## 2023-05-08 ENCOUNTER — Ambulatory Visit: Payer: Medicare Other | Admitting: Adult Health

## 2023-05-14 ENCOUNTER — Encounter: Payer: Self-pay | Admitting: Internal Medicine

## 2023-05-26 ENCOUNTER — Ambulatory Visit (INDEPENDENT_AMBULATORY_CARE_PROVIDER_SITE_OTHER): Payer: Medicare Other | Admitting: Family

## 2023-05-26 ENCOUNTER — Encounter (HOSPITAL_BASED_OUTPATIENT_CLINIC_OR_DEPARTMENT_OTHER): Payer: Self-pay | Admitting: Family

## 2023-05-26 VITALS — BP 104/60 | HR 59 | Ht 69.0 in | Wt 129.6 lb

## 2023-05-26 DIAGNOSIS — E782 Mixed hyperlipidemia: Secondary | ICD-10-CM

## 2023-05-26 DIAGNOSIS — I491 Atrial premature depolarization: Secondary | ICD-10-CM | POA: Diagnosis not present

## 2023-05-26 DIAGNOSIS — R002 Palpitations: Secondary | ICD-10-CM

## 2023-05-26 DIAGNOSIS — I700 Atherosclerosis of aorta: Secondary | ICD-10-CM

## 2023-05-26 HISTORY — PX: COLONOSCOPY: SHX174

## 2023-05-26 NOTE — Progress Notes (Unsigned)
Cardiology Office Note:  .   Date:  05/27/2023  ID:  Megan [name], DOB 1955-09-20, MRN [id number] PCP: Thana Ates, MD  [facility] Providers Cardiologist:  Olga Millers, MD    History of Present Illness: Megan [name] is a 67 y.o. female  with a hx of aneurysm of membranous ventricular septum, thyroid disease, aortic atherosclerosis.   Prior MRA 08/2013 aneurysmal dilation of membranous ventricular septum extending into RV outflow tract with no sinus of valsalva aneurysm. Carotid duplex 03/2015 normal. Echo 12/2016 normal LVEF, mild MR. Stress echo 09/2017 normal. CTA 01/2018 aneurysm of membranous ventricular septum that bulges into RV outflow tract. No connection between LV and RV. No sinus of valsalva aneurysm. Calcium score 0. ABIs 03/2019 revealed noncompressible LLE artery and normal right. 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 Monitor 01/2020 due to palpitations SR, SB, ST with rare PAC/PVC and brief PAT. ABIs 09/2020 normal.    Seen 05/08/22 noting occasional chest discomfort at rest or with activity that was atypical for angina not recommended for further workup.    Seen 08/27/22 for chest pain, palpitations. ZIO and CTA ordered. Monitor with predominantly NSR, average rate 70 bpm, 6% PAC burden. Toprol 25mg  QD added. CTA scheduled for 09/26/22.However she was nervous about pre-medications for CTA and was instead scheduled for coronary calcium score which was 0.   Last seen 09/30/22 doing well. Echo 11/2022 at outside provider LVEF 65-70%, no RWMA, RV normal, borderline MVP, trace TR. Carotid duplex 03/2023 with no stenosis.    She presents today for follow up. Ongoing dental issue requiring soft diet limiting heart healthy diet. Previously had increased palpitations 03/2023 triggered by more stress, caffeine which has improved. Since last seen tested equivocal for sjogren's. Notes  intermittent hypotension as low as BP 85/60 with no lightheadedness, dizziness. No chest pain, exertional dyspnea, edema. Exercising regularly at her spin class. Initially elevated heart rate during spin class which quickly resolves - asymptomatic and only notes via her smart watch.   ROS: Please see the history of present illness.    All other systems reviewed and are negative.  Studies Reviewed: [name]   EKG Interpretation Date/Time:  Tuesday May 26 2023 10:13:55 EDT Ventricular Rate:  59 PR Interval:  204 QRS Duration:  78 QT Interval:  430 QTC Calculation: 425 R Axis:   84  Text Interpretation: Sinus bradycardia  No acute ST/T wave changes. Confirmed by Gillian Shields ([id number]) on 05/26/2023 10:23:49 AM    Cardiac Studies & Procedures     STRESS TESTS  ECHOCARDIOGRAM STRESS TEST 10/20/2017  Narrative *[facility] [address] [place], [place] [address] [phone]  ------------------------------------------------------------------- Stress Echocardiography  Patient:    Megan [name], Megan [name] MR #:       [id number] Study Date: 10/20/2017 Gender:     F Age:        59 Height:     175.3 cm Weight:     60 kg BSA:        1.7 m^2 Pt. Status: Room:  Beryle Flock [id number] ORDERING     Barrett, Joline Salt REFERRING    Barrett, Joline Salt SONOGRAPHER  Aida Raider, RDCS PERFORMING   [facility], Outpatient ATTENDING    Chilton Si, MD  cc:  -------------------------------------------------------------------  ------------------------------------------------------------------- Indications:      R07.9 Chest pain.  ------------------------------------------------------------------- History:   PMH:  Asthma. MVP. Hypothyroidism.  Medications:  No other medications.  ------------------------------------------------------------------- Study Conclusions  - Stress ECG conclusions: There were no stress arrhythmias or conduction abnormalities. The stress ECG  was normal. - Staged echo: There was no echocardiographic evidence for stress-induced ischemia.  Impressions:  - Normal stress echo.  ------------------------------------------------------------------- Study data:   Study status:  Routine.  Consent:  The risks, benefits, and alternatives to the procedure were explained to the patient and informed consent was obtained.  Procedure:  The patient reported no pain pre or post test. Initial setup. The patient was brought to the laboratory. A baseline ECG was recorded. Surface ECG leads and automatic cuff blood pressure measurements were monitored. Treadmill exercise testing was performed using the Bruce protocol. The patient exercised for 11 min, to protocol stage 4, to a maximal work rate of 13.4 mets. Exercise was terminated due to achievement of target heart rate, patient request, and fatigue. The patient was positioned for image acquisition and recovery monitoring. Transthoracic stress echocardiography for chest pain evaluation. Image quality was adequate. Images were captured at baseline and peak exercise.  Study completion:  The patient tolerated the procedure well. There were no complications. Bruce protocol. Stress echocardiography.  Birthdate:  Patient birthdate: [birth year].  Age:  Patient is 67 yr old.  Sex:  Gender: female.    BMI: 19.5 kg/m^2.  Blood pressure:     110/71  Patient status:  Outpatient.  Study date:  Study date: 10/20/2017. Study time: 07:45 AM.  -------------------------------------------------------------------  ------------------------------------------------------------------- Baseline ECG:  Normal.  Normal sinus rhythm.  ------------------------------------------------------------------- Stress protocol:  +---------------------+---+-----------+---------+ !Stage                !HR !BP (mmHg)  !Symptoms ! +---------------------+---+-----------+---------+ !Baseline             !64 !100/70 (80)!None      ! +---------------------+---+-----------+---------+ !Stage 1              !90 !151/71 (98)!None     ! +---------------------+---+-----------+---------+ !Stage 2              !960!454/09 (89)!None     ! +---------------------+---+-----------+---------+ !Stage 3              !126!152/68 (96)!Fatigue  ! +---------------------+---+-----------+---------+ !Stage 4              !150!-----------!Fatigue  ! +---------------------+---+-----------+---------+ !Immediate post stress!148!124/63 (83)!Subsiding! +---------------------+---+-----------+---------+ !Recovery; 1 min      !111!-----------!None     ! +---------------------+---+-----------+---------+ !Recovery; 2 min      !89 !-----------!None     ! +---------------------+---+-----------+---------+ !Recovery; 3 min      !83 !107/58 (74)!None     ! +---------------------+---+-----------+---------+ !Recovery; 4 min      !78 !-----------!None     ! +---------------------+---+-----------+---------+ !Recovery; 5 min      !76 !-----------!None     ! +---------------------+---+-----------+---------+ !Late recovery        !79 !103/57 (72)!None     ! +---------------------+---+-----------+---------+  ------------------------------------------------------------------- Stress results:   Maximal heart rate during stress was 150 bpm (95% of maximal predicted heart rate). The maximal predicted heart rate was 158 bpm.The target heart rate was achieved. The heart rate response to stress was normal. There was a normal resting blood pressure with an appropriate response to stress. The rate-pressure product for the peak heart rate and blood pressure was 81191 mm Hg/min.  The patient experienced no chest pain during stress.  ------------------------------------------------------------------- Stress ECG:  There were no stress arrhythmias or conduction abnormalities.  The stress ECG was  normal.  ------------------------------------------------------------------- Baseline:  Rest:  - LV size was normal. - LV global systolic function was normal. The estimated LV ejection fraction was 60-65%. - Normal wall motion; no LV regional wall motion abnormalities.  - RV size was normal. - There were no RV regional wall motion abnormalities.  Stress:  - LV size was normal. - LV global systolic function was vigorous and appropriately augmented from baseline. The estimated LV ejection fraction was 65-70%. - Normal wall motion; no LV regional wall motion abnormalities.  ------------------------------------------------------------------- Stress echo results:     Left ventricular ejection fraction was normal at rest and with stress. There was no echocardiographic evidence for stress-induced ischemia.  ------------------------------------------------------------------- Prepared and Electronically Authenticated by  Chilton Si, MD 2019-02-26T14:39:59   ECHOCARDIOGRAM  ECHOCARDIOGRAM COMPLETE 01/06/2017  Narrative *[facility]* [address] [place], [place] [address] [phone]  ------------------------------------------------------------------- Transthoracic Echocardiography  Patient:    Megan [name], Megan [name] MR #:       [id number] Study Date: 01/06/2017 Gender:     F Age:        37 Height:     176.5 cm Weight:     60.1 kg BSA:        1.71 m^2 Pt. Status: Room:  SONOGRAPHER  Aida Raider, RDCS PERFORMING   [facility], Outpatient ATTENDING    Iran Ouch, David Stall, [place] M REFERRING    Iran Ouch [place] M  cc:  ------------------------------------------------------------------- LV EF: 60% -   65%  ------------------------------------------------------------------- Indications:      R06.00 Dyspnea.  ------------------------------------------------------------------- History:   PMH:  Acquired from the patient and from  the patient&'s chart.  PMH:  Asthma. Hypothyroidism. Mitral Valve Prolapse. Peripheral Neuropathy. Pneumothorax.  ------------------------------------------------------------------- Study Conclusions  - Left ventricle: The cavity size was normal. Wall thickness was normal. Systolic function was normal. The estimated ejection fraction was in the range of 60% to 65%. Wall motion was normal; there were no regional wall motion abnormalities. Left ventricular diastolic function parameters were normal. - Mitral valve: There was mild regurgitation.  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  ------------------------------------------------------------------- Study data:   Study status:  Routine.  Procedure:  The patient reported no pain pre or post test. Transthoracic echocardiography for left ventricular function evaluation, for right ventricular function evaluation, and for assessment of valvular function. Image quality was adequate.  Study completion:  There were no complications.          Transthoracic echocardiography.  M-mode, complete 2D, spectral Doppler, and color Doppler.  Birthdate: Patient birthdate: 1955-11-20.  Age:  Patient is 67 yr old.  Sex: Gender: female.    BMI: 19.3 kg/m^2.  Blood pressure:     96/61 Patient status:  Outpatient.  Study date:  Study date: 01/06/2017. Study time: 01:57 PM.  Location:  [facility]  -------------------------------------------------------------------  ------------------------------------------------------------------- Left ventricle:  The cavity size was normal. Wall thickness was normal. Systolic function was normal. The estimated ejection fraction was in the range of 60% to 65%. Wall motion was normal; there were no regional wall motion abnormalities. Left ventricular diastolic function parameters were  normal.  ------------------------------------------------------------------- Aortic valve:   Trileaflet; normal thickness, mildly calcified leaflets. Mobility was not restricted.  Doppler:  Transvalvular velocity was within the normal range. There was no stenosis. There was no regurgitation.  ------------------------------------------------------------------- Aorta:  Aortic root: The aortic root was normal in size.  ------------------------------------------------------------------- Mitral valve:   Structurally normal valve.   Mobility was not restricted.  Doppler:  Transvalvular velocity was within the normal range. There was no evidence for stenosis. There was mild regurgitation.    Peak gradient (D): 4 mm Hg.  ------------------------------------------------------------------- Left atrium:  The atrium was normal in size.  ------------------------------------------------------------------- Right ventricle:  The cavity size was normal. Systolic function was normal.  ------------------------------------------------------------------- Pulmonic valve:    Doppler:  Transvalvular velocity was within the normal range. There was no evidence for stenosis.  ------------------------------------------------------------------- Tricuspid valve:   Structurally normal valve.    Doppler: Transvalvular velocity was within the normal range. There was trivial regurgitation.  ------------------------------------------------------------------- Right atrium:  The atrium was normal in size.  ------------------------------------------------------------------- Pericardium:  There was no pericardial effusion.  ------------------------------------------------------------------- Systemic veins: Inferior vena cava: The vessel was normal in size.  ------------------------------------------------------------------- Measurements  Left ventricle                         Value        Reference LV ID,  ED, PLAX chordal        (L)     39.1  mm     43 - 52 LV ID, ES, PLAX chordal                25.9  mm     23 - 38 LV fx shortening, PLAX chordal         34    %      >=29 LV PW thickness, ED                    8.01  mm     --------- IVS/LV PW ratio, ED                    0.97         <=1.3 Stroke volume, 2D                      70    ml     --------- Stroke volume/bsa, 2D                  41    ml/m^2 --------- LV e&', lateral                         13.9  cm/s   --------- LV E/e&', lateral                       7.55         --------- LV e&', medial                          9.54  cm/s   --------- LV E/e&', medial                        11.01        --------- LV e&', average                         11.72 cm/s   --------- LV E/e&', average                       8.96         ---------  Ventricular septum                     Value        Reference IVS thickness, ED                      7.8   mm     ---------  LVOT                                   Value        Reference LVOT ID, S                             20    mm     --------- LVOT area                              3.14  cm^2   --------- LVOT ID                                20    mm     --------- LVOT peak velocity, S                  82    cm/s   --------- LVOT mean velocity, S                  62    cm/s   --------- LVOT VTI, S                            22.2  cm     --------- LVOT peak gradient, S                  3     mm Hg  --------- Stroke volume (SV), LVOT DP            69.7  ml     --------- Stroke index (SV/bsa), LVOT DP         40.9  ml/m^2 ---------  Aorta                                  Value        Reference Aortic root ID, ED                     28    mm     --------- Ascending aorta ID, A-P, S             28    mm     ---------  Left atrium                            Value        Reference LA ID, A-P, ES                         32    mm     --------- LA ID/bsa, A-P  1.87  cm/m^2 <=2.2 LA  volume, S                           44    ml     --------- LA volume/bsa, S                       25.8  ml/m^2 --------- LA volume, ES, 1-p A4C                 43    ml     --------- LA volume/bsa, ES, 1-p A4C             25.2  ml/m^2 --------- LA volume, ES, 1-p A2C                 45    ml     --------- LA volume/bsa, ES, 1-p A2C             26.4  ml/m^2 ---------  Mitral valve                           Value        Reference Mitral E-wave peak velocity            105   cm/s   --------- Mitral A-wave peak velocity            72.1  cm/s   --------- Mitral deceleration time               197   ms     150 - 230 Mitral peak gradient, D                4     mm Hg  --------- Mitral E/A ratio, peak                 1.5          ---------  Tricuspid valve                        Value        Reference Tricuspid regurg peak velocity         225   cm/s   --------- Tricuspid peak RV-RA gradient          20    mm Hg  ---------  Right ventricle                        Value        Reference RV s&', lateral, S                      13.6  cm/s   ---------  Legend: (L)  and  (H)  mark values outside specified reference range.  ------------------------------------------------------------------- Prepared and Electronically Authenticated by  Olga Millers 2018-05-15T15:50:14    MONITORS  LONG TERM MONITOR (3-14 DAYS) 09/17/2022  Narrative Patch Wear Time:  13 days and 0 hours (2024-01-03T15:31:02-499 to 2024-01-16T16:14:30-0500)  Patient had a min HR of 48 bpm, max HR of 156 bpm, and avg HR of 70 bpm. Predominant underlying rhythm was Sinus Rhythm. Slight P wave morphology changes were noted. 2 Ventricular Tachycardia runs occurred, the run with the fastest interval lasting 4 beats with a max rate of 156 bpm, the longest lasting 7 beats with an avg rate of  114 bpm. 8 Supraventricular Tachycardia runs occurred, the run with the fastest interval lasting 5 beats with a max rate of 138 bpm, the longest  lasting 6 beats with an avg rate of 97 bpm. Some episodes of Supraventricular Tachycardia may be possible Atrial Tachycardia with variable block. Isolated SVEs were frequent (6.1%, 74502), SVE Couplets were rare (<1.0%, 4170), and SVE Triplets were rare (<1.0%, 364). Isolated VEs were rare (<1.0%, 3986), VE Couplets were rare (<1.0%, 9), and VE Triplets were rare (<1.0%, 1). Ventricular Bigeminy was present.  Sinus rhythm with PACs, brief PAT, PVCs and nonsustained ventricular tachycardia (4 and 6 beats). Olga Millers   CT SCANS  CT CARDIAC SCORING (SELF PAY ONLY) 09/25/2022  Addendum 09/28/2022  1:23 AM ADDENDUM REPORT: 09/28/2022 01:21  EXAM: OVER-READ INTERPRETATION  CT CHEST  The following report is an over-read performed by radiologist Dr. Noe Gens [facility] on 09/28/2022. This over-read does not include interpretation of cardiac or coronary anatomy or pathology. The coronary calcium score interpretation by the cardiologist is attached.  COMPARISON:  04/11/2022  FINDINGS: Heart is normal size. Aorta normal caliber. No adenopathy. No confluent opacities or effusions. No acute findings in the upper abdomen. Chest wall soft tissues are unremarkable. No acute bony abnormality.  IMPRESSION: No acute or significant extracardiac abnormality.   Electronically Signed By: Charlett Nose M.D. On: 09/28/2022 01:21  Narrative CLINICAL DATA:  31F for cardiovascular disease risk stratification  EXAM: Coronary Calcium Score  TECHNIQUE: A gated, non-contrast computed tomography scan of the heart was performed using 3mm slice thickness. Axial images were analyzed on a dedicated workstation. Calcium scoring of the coronary arteries was performed using the Agatston method.  FINDINGS: Coronary arteries: Normal origins.  Coronary Calcium Score: 0  Pericardium: Normal.  Ascending Aorta: Normal caliber.  Non-cardiac: See separate report from [facility]  [facility].  IMPRESSION: Coronary calcium score of 0. This was 1st percentile for age-, race-, and sex-matched controls.  RECOMMENDATIONS: Coronary artery calcium (CAC) score is a strong predictor of incident coronary heart disease (CHD) and provides predictive information beyond traditional risk factors. CAC scoring is reasonable to use in the decision to withhold, postpone, or initiate statin therapy in intermediate-risk or selected borderline-risk asymptomatic adults (age 31-75 years and LDL-C >=70 to <190 mg/dL) who do not have diabetes or established atherosclerotic cardiovascular disease (ASCVD).* In intermediate-risk (10-year ASCVD risk >=7.5% to <20%) adults or selected borderline-risk (10-year ASCVD risk >=5% to <7.5%) adults in whom a CAC score is measured for the purpose of making a treatment decision the following recommendations have been made:  If CAC=0, it is reasonable to withhold statin therapy and reassess in 5 to 10 years, as long as higher risk conditions are absent (diabetes mellitus, family history of premature CHD in first degree relatives (males <55 years; females <65 years), cigarette smoking, or LDL >=190 mg/dL).  If CAC is 1 to 99, it is reasonable to initiate statin therapy for patients >=58 years of age.  If CAC is >=100 or >=75th percentile, it is reasonable to initiate statin therapy at any age.  Cardiology referral should be considered for patients with CAC scores >=400 or >=75th percentile.  *2018 AHA/ACC/AACVPR/AAPA/ABC/ACPM/ADA/AGS/APhA/ASPC/NLA/PCNA Guideline on the Management of Blood Cholesterol: A Report of the American College of Cardiology/American Heart Association Task Force on Clinical Practice Guidelines. J Am Coll Cardiol. 2019;73(24):3168-3209.  Chilton Si, MD  Electronically Signed: By: Chilton Si M.D. On: 09/26/2022 23:51   CT SCANS  CT CORONARY MORPH W/CTA COR W/SCORE  01/28/2018  Addendum 01/28/2018  4:03  PM ADDENDUM REPORT: 01/28/2018 16:01  CLINICAL DATA:  Chest pain  EXAM: Cardiac CTA  MEDICATIONS: Sub lingual nitro.  4mg  x 2 and lopressor 5mg  IV  TECHNIQUE: The patient was scanned on a Siemens 192 slice scanner. Gantry rotation speed was 250 msecs. Collimation was 0.6 mm. A 100 kV prospective scan was triggered in the ascending thoracic aorta at 35-75% of the R-R interval. Average HR during the scan was 60 bpm. The 3D data set was interpreted on a dedicated work station using MPR, MIP and VRT modes. A total of 80cc of contrast was used.  FINDINGS: Non-cardiac: See separate report from [facility].  There is an aneurysm of the membranous ventricular septum that bulges into the RV outflow tract, it is about 1.5 cm deep. There does not appear to be a connection between LV and RV (no contrast extravasation). This is not a sinus of Valsalva aneurysm (it is proximal to aortic valve in the membranous ventricular septum).  Calcium Score: 0 Agatston units.  Coronary Arteries: Right dominant with no anomalies  LM: No plaque or stenosis.  LAD system: No plaque or stenosis.  Circumflex system: No plaque or stenosis.  RCA system: No plaque or stenosis.  IMPRESSION: 1. Aneurysm of the membranous ventricular septum bulging into the RVOT, there does not appear to be a an actual VSD. No sinus of Valsalva aneurysm.  2. Coronary artery calcium score 0 Agatston units, suggesting low risk for future cardiac events.  3.  No plaque or stenosis noted in the coronary tree.  Dalton Mclean   Electronically Signed By: Marca Ancona M.D. On: 01/28/2018 16:01  Narrative EXAM: OVER-READ INTERPRETATION  CT CHEST  The following report is an over-read performed by radiologist Dr. Charlett Nose of [facility] on 01/28/2018. This over-read does not include interpretation of cardiac or coronary anatomy or pathology. The coronary CTA interpretation by the cardiologist  is attached.  COMPARISON:  None.  FINDINGS: Vascular: Heart is normal size.  Visualized aorta is normal caliber.  Mediastinum/Nodes: No adenopathy in the lower mediastinum or hila.  Lungs/Pleura: Visualized lungs clear.  No effusions.  Upper Abdomen: Imaging into the upper abdomen shows no acute findings.  Musculoskeletal: Chest wall soft tissues are unremarkable. No acute bony abnormality.  IMPRESSION: No acute or significant extracardiac abnormality.  Electronically Signed: By: Charlett Nose M.D. On: 01/28/2018 10:32   CARDIAC MRI  MR CARDIAC MORPHOLOGY W WO CONTRAST 09/13/2013  Narrative CLINICAL DATA:  Abnormal echo?  Sinus of valsalva aneurysm  EXAM: CARDIAC MRI  TECHNIQUE: The patient was scanned on a 1.5 Tesla GE magnet. A dedicated cardiac coil was used. Functional imaging was done using Fiesta sequences. 2,3, and 4 chamber views were done to assess for RWMA's. Modified Simpson's rule using a short axis stack was used to calculate an ejection fraction on a dedicated work [employer]. The patient received 25 cc of Multihance. After 10 minutes inversion recovery sequences were used to assess for infiltration and scar tissue. After gadolinium injection aortic MRA was done with 3D reconstruction  CONTRAST:  25 cc multihance  FINDINGS: All 4 cardiac chambers were normal in size and function There was no RV or RA enlargement. There was no hypertrophy The aortic, mitral and tricuspid valves were normal. There was no pericardial effusion. There was a defect in the most superior aspect of the membranous ventricular septum There appeared to be and aneurysm protruding into the RV outflow  tract. This did not involve the aortic annulus or sinuses. It was clearly in the LVOT and involved the membranous septum  The aortic valve itself was trileaflet and normal The 3 aortic sinuses were symmetrical, non dilated and measured 2.8 cm. There was no  AR.  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 with no apparent systolic flow or shunting  2) Normal sinus of valsalva 2.8 cm symmetrical in all 3 sinuses with no AR and normal trileaflet AV  3) Normal RA/LA/RV and LV EF 57%  4) No hyperenhancement on delayed gadolinium images  5) Normal aortic MRA with no aneurysm or coarctation and normal origin of the great vessels  Charlton Haws MD [facility]   Electronically Signed By: Tobias Alexander On: 09/13/2013 16:11         Risk Assessment/Calculations:             Physical Exam:   VS:  BP 104/60   Pulse (!) 59   Ht 5\' 9"  (1.753 m)   Wt 129 lb 9.6 oz (58.8 kg)   BMI 19.14 kg/m    Wt Readings from Last 3 Encounters:  05/26/23 129 lb 9.6 oz (58.8 kg)  04/22/23 131 lb 8 oz (59.6 kg)  03/25/23 129 lb 6 oz (58.7 kg)    GEN: Well nourished, well developed in no acute distress NECK: No JVD; No carotid bruits CARDIAC: RRR, no murmurs, rubs, gallops RESPIRATORY:  Clear to auscultation without rales, wheezing or rhonchi  ABDOMEN: Soft, non-tender, non-distended EXTREMITIES:  No edema; No deformity   ASSESSMENT AND PLAN: .    Chest pain in adult - CTA 09/2022 coronary calcium score of 0. Rare chest pain at rest atypical for angina. No indication for further workup.   Aortic atherosclerosis - Prefers to avoid medications if possible and is hesitant about statins. Discussed maintaining LDL <100, ideally <70. 05/04/23 normal ApoB and Lp(a). 05/04/23 LDL 88. Update NMR panel in 6 mos.    Palpitations -  ZIO with 6% PAC burden, brief episodes of SVT which were asymptomatic. She prefers to avoid daily medications. Given baseline bradycardia, would avoid daily AV nodal blocking agent. Continue Metoprolol tartrate 12.5mg -25mg  PRN for palpitations.    Venous insufficiency - Bilateral LE with varicose veins and trace pretibial edema. Improved with compression stockings. Continue  compression stockings, leg elevation. Follows with [facility].          Dispo: follow up in 6 months with NMR profile prior  Signed, Alver Sorrow, NP

## 2023-05-26 NOTE — Patient Instructions (Addendum)
Medication Instructions:  Continue your current medications.  *If you need a refill on your cardiac medications before your next appointment, please call your pharmacy*   Lab Work: Your physician recommends that you return for lab work in 6 months for fasting NMR profile.   If you have labs (blood work) drawn today and your tests are completely normal, you will receive your results only by: MyChart Message (if you have MyChart) OR A paper copy in the mail If you have any lab test that is abnormal or we need to change your treatment, we will call you to review the results.   Testing/Procedures: Your EKG today shows sinus bradycardia which is a slow but regular heart rhythm which is stable.   Follow-Up: At [facility], you and your health needs are our priority.  As part of our continuing mission to provide you with exceptional heart care, we have created designated Provider Care Teams.  These Care Teams include your primary Cardiologist (physician) and Advanced Practice Providers (APPs -  Physician Assistants and Nurse Practitioners) who all work together to provide you with the care you need, when you need it.  We recommend signing up for the patient portal called "MyChart".  Sign up information is provided on this After Visit Summary.  MyChart is used to connect with patients for Virtual Visits (Telemedicine).  Patients are able to view lab/test results, encounter notes, upcoming appointments, etc.  Non-urgent messages can be sent to your provider as well.   To learn more about what you can do with MyChart, go to [email or web address].    Your next appointment:   6 month(s)  Provider:   Gillian Shields, NP    Other Instructions   To prevent or reduce lower extremity swelling: Eat a low salt diet. Salt makes the body hold onto extra fluid which causes swelling. Sit with legs elevated. For example, in the recliner or on an ottoman.  Wear knee-high compression stockings  during the daytime. Ones labeled 15-20 mmHg provide good compression.

## 2023-05-27 ENCOUNTER — Encounter (HOSPITAL_BASED_OUTPATIENT_CLINIC_OR_DEPARTMENT_OTHER): Payer: Self-pay

## 2023-05-27 ENCOUNTER — Encounter (HOSPITAL_BASED_OUTPATIENT_CLINIC_OR_DEPARTMENT_OTHER): Payer: Self-pay | Admitting: Family

## 2023-06-01 ENCOUNTER — Ambulatory Visit: Payer: Medicare Other | Admitting: Podiatry

## 2023-06-02 ENCOUNTER — Encounter: Payer: Self-pay | Admitting: Internal Medicine

## 2023-06-02 ENCOUNTER — Telehealth: Payer: Self-pay | Admitting: Internal Medicine

## 2023-06-02 ENCOUNTER — Ambulatory Visit: Payer: Medicare Other | Admitting: Internal Medicine

## 2023-06-02 ENCOUNTER — Other Ambulatory Visit (HOSPITAL_COMMUNITY)
Admission: RE | Admit: 2023-06-02 | Discharge: 2023-06-02 | Disposition: A | Discharge status: Home / Self Care | Payer: Medicare Other | Source: Ambulatory Visit | Attending: Internal Medicine | Admitting: Internal Medicine

## 2023-06-02 VITALS — BP 103/56 | HR 65 | Temp 98.2°F | Resp 15 | Ht 69.0 in | Wt 129.0 lb

## 2023-06-02 DIAGNOSIS — K1379 Other lesions of oral mucosa: Secondary | ICD-10-CM

## 2023-06-02 DIAGNOSIS — Z800 Family history of malignant neoplasm of digestive organs: Secondary | ICD-10-CM

## 2023-06-02 DIAGNOSIS — Z1211 Encounter for screening for malignant neoplasm of colon: Secondary | ICD-10-CM

## 2023-06-02 DIAGNOSIS — D510 Vitamin B12 deficiency anemia due to intrinsic factor deficiency: Secondary | ICD-10-CM

## 2023-06-02 MED ORDER — SODIUM CHLORIDE 0.9 % IV SOLN: 500.0000 mL | Freq: Once | INTRAVENOUS | Status: DC

## 2023-06-02 NOTE — Progress Notes (Signed)
Report to PACU, RN, vss, BBS= Clear.  

## 2023-06-02 NOTE — Progress Notes (Signed)
Pt reports only new medical history was Equivocal for Sjogrens  autoimmune disease. Not diagnosed with it yet.

## 2023-06-02 NOTE — Progress Notes (Signed)
GASTROENTEROLOGY PROCEDURE H&P NOTE   Primary Care Physician: Thana Ates, MD    Reason for Procedure:  Pernicious anemia, B12 deficiency, family history of colon cancer  Plan:    EGD and colonoscopy  Patient is appropriate for endoscopic procedure(s) in the ambulatory ([facility]) setting.  The nature of the procedure, as well as the risks, benefits, and alternatives were carefully and thoroughly reviewed with the patient. Ample time for discussion and questions allowed. The patient understood, was satisfied, and agreed to proceed.     HPI: Megan [name] is a 67 y.o. female who presents for EGD and colonoscopy.  Medical history as below.  Tolerated the prep.  No recent chest pain or shortness of breath.  No abdominal pain today.  Past Medical History:  Diagnosis Date   Allergic rhinitis    Allergy    Aneurysm of cardiac wall, congenital    a.  septal aneurysm extending into the RVOT (by cardiac MRI in 08/2013)   Anxiety    Aortic atherosclerosis (HCC)    Arthritis    Asthma    Basal cell carcinoma    Depression    Heart murmur    Hypothyroidism    on meds   Leukopenia    idiopathic   Lyme disease    Mitral valve prolapse    MVP (mitral valve prolapse)    Orthostatic hypotension    Osteoporosis    Pancreatic cyst    Peripheral neuropathy 06/09/2012   Pernicious anemia    Plantar fasciitis    Pneumothorax     Past Surgical History:  Procedure Laterality Date   COLONOSCOPY  2019   JMP-   COLONOSCOPY WITH PROPOFOL N/A 02/22/2013   Procedure: COLONOSCOPY WITH PROPOFOL;  Surgeon: Charolett Bumpers, MD;  Location: [facility];  Service: Endoscopy;  Laterality: N/A;   MOUTH SURGERY  01/2012   bone graft in mouth   NASAL SINUS SURGERY Right 06/12/2014   Procedure: RIGHT ENDOSCOPIC MAXILLARY ANTROSTOMY;  Surgeon: Darletta Moll, MD;  Location: [facility];  Service: ENT;  Laterality: Right;   ROOT CANAL  02/27/2012   TONSILLECTOMY  1962   WISDOM TOOTH  EXTRACTION      Prior to Admission medications   Medication Sig Start Date End Date Taking? Authorizing Provider  5-Hydroxytryptophan (5-HTP) 100 MG CAPS Take 100 mg by mouth daily.     [provider]  albuterol (VENTOLIN HFA) 108 (90 Base) MCG/ACT inhaler Inhale 1 puff into the lungs daily as needed for wheezing or shortness of breath.  05/24/20   [provider]  AMBULATORY NON FORMULARY MEDICATION 3 (three) times daily. Medication Name: Amino acids double wood    [provider]  ARMOUR THYROID 30 MG tablet Take 1 tablet (30 mg total) by mouth daily. 04/09/22   Carlus Pavlov, MD  Ascorbic Acid (VITAMIN C) POWD Take 2,500 mg by mouth daily.    [provider]  Cholecalciferol (VITAMIN D3) 10000 units capsule Take 10,000 Units by mouth daily.     [provider]  Cyanocobalamin (VITAMIN B-12 ER PO) Take 5,000 mcg by mouth daily.     [provider]  Cyanocobalamin (VITAMIN B-12 IJ) Inject 2,500 mcg as directed once a week.    [provider]  estradiol (ESTRACE) 0.1 MG/GM vaginal cream Place 1 Applicatorful vaginally 3 (three) times a week.    [provider]  estradiol (ESTRACE) 1 MG tablet Take 1 mg by mouth daily.  [provider]  MAGNESIUM CITRATE PO Take 600 mg by mouth daily.    [provider]  MAGNESIUM OXIDE PO Take 290 mg by mouth daily.    [provider]  Menaquinone-7 (VITAMIN K2) 100 MCG CAPS Take 1 capsule by mouth daily. 08/25/20   [provider]  Multiple Vitamin (MULTIVITAMIN) capsule Take 1 capsule by mouth daily.    [provider]  NON FORMULARY Take 1,000 mcg by mouth daily. Methyl Folate chewable tablet    [provider]  NONFORMULARY OR COMPOUNDED ITEM Exosome 1 trillion, I injection monthy    [provider]  progesterone (PROMETRIUM) 100 MG capsule Take 1.5 capsules by mouth daily.    [provider]  TESTOSTERONE  UNDECANOATE PO Take by mouth daily.    [provider]    Current Outpatient Medications  Medication Sig Dispense Refill   5-Hydroxytryptophan (5-HTP) 100 MG CAPS Take 100 mg by mouth daily.      albuterol (VENTOLIN HFA) 108 (90 Base) MCG/ACT inhaler Inhale 1 puff into the lungs daily as needed for wheezing or shortness of breath.      AMBULATORY NON FORMULARY MEDICATION 3 (three) times daily. Medication Name: Amino acids double wood     ARMOUR THYROID 30 MG tablet Take 1 tablet (30 mg total) by mouth daily. 90 tablet 3   Ascorbic Acid (VITAMIN C) POWD Take 2,500 mg by mouth daily.     Cholecalciferol (VITAMIN D3) 10000 units capsule Take 10,000 Units by mouth daily.      Cyanocobalamin (VITAMIN B-12 ER PO) Take 5,000 mcg by mouth daily.      Cyanocobalamin (VITAMIN B-12 IJ) Inject 2,500 mcg as directed once a week.     estradiol (ESTRACE) 0.1 MG/GM vaginal cream Place 1 Applicatorful vaginally 3 (three) times a week.     estradiol (ESTRACE) 1 MG tablet Take 1 mg by mouth daily.     MAGNESIUM CITRATE PO Take 600 mg by mouth daily.     MAGNESIUM OXIDE PO Take 290 mg by mouth daily.     Menaquinone-7 (VITAMIN K2) 100 MCG CAPS Take 1 capsule by mouth daily.     Multiple Vitamin (MULTIVITAMIN) capsule Take 1 capsule by mouth daily.     NON FORMULARY Take 1,000 mcg by mouth daily. Methyl Folate chewable tablet     NONFORMULARY OR COMPOUNDED ITEM Exosome 1 trillion, I injection monthy     progesterone (PROMETRIUM) 100 MG capsule Take 1.5 capsules by mouth daily.     TESTOSTERONE UNDECANOATE PO Take by mouth daily.     No current facility-administered medications for this visit.    Allergies as of 06/02/2023 - Review Complete 05/27/2023  Allergen Reaction Noted   Augmentin [amoxicillin-pot clavulanate] Diarrhea 06/29/2019   Clindamycin/lincomycin Diarrhea 06/29/2019   Iron  09/25/2020   Moxifloxacin Other (See Comments) 11/12/2020   Sodium citrate  06/29/2019    Family History   Problem Relation Age of Onset   Hyperlipidemia Father    Heart failure Father    Prostate cancer Father 46   Kidney failure Father    Hypertension Father    Colon polyps Father    Angina Mother    Hypertension Mother    Lung cancer Mother 21       again at 75/79-smoker   Colon cancer Mother 39   Colon polyps Mother    Lung cancer Paternal Grandfather    Breast cancer Paternal Grandmother    Diabetes Paternal Grandmother  Esophageal cancer Neg Hx    Liver cancer Neg Hx    Pancreatic cancer Neg Hx    Rectal cancer Neg Hx    Stomach cancer Neg Hx     Social History   Socioeconomic History   Marital status: Divorced    Spouse name: Not on file   Number of children: 0   Years of education: Not on file   Highest education level: Not on file  Occupational History    Comment: Career and life coach  Tobacco Use   Smoking status: Never   Smokeless tobacco: Never  Vaping Use   Vaping status: Never Used  Substance and Sexual Activity   Alcohol use: No    Alcohol/week: 0.0 standard drinks of alcohol   Drug use: No   Sexual activity: Not Currently  Other Topics Concern   Not on file  Social History Narrative   Patient is single and lives alone.   Patient is self-employed, career and life coaching.   Patient drinks two to four cups of caffeine daily.   Right handed    Social Determinants of Health   Financial Resource Strain: Not on file  Food Insecurity: Not on file  Transportation Needs: Not on file  Physical Activity: Not on file  Stress: Not on file  Social Connections: Not on file  Intimate Partner Violence: Not on file    Physical Exam: Vital signs in last 24 hours: @There  were no vitals taken for this visit. GEN: NAD EYE: Sclerae anicteric ENT: MMM CV: Non-tachycardic Pulm: CTA b/l GI: Soft, NT/ND NEURO:  Alert & Oriented x 3   Erick Blinks, MD [facility]  06/02/2023 12:33 PM

## 2023-06-02 NOTE — Op Note (Signed)
[facility] Patient Name: Megan [name] Procedure Date: 06/02/2023 12:57 PM MRN: [id number] Endoscopist: Beverley Fiedler , MD, [id number] Age: 67 Referring MD:  Date of Birth: 06/25/1956 Gender: Female Account #: [id number] Procedure:                Colonoscopy Indications:              Screening in patient at increased risk: Family                            history of 1st-degree relative with colorectal                            cancer, Last colonoscopy: July 2019 Medicines:                Monitored Anesthesia Care Procedure:                Pre-Anesthesia Assessment:                           - Prior to the procedure, a History and Physical                            was performed, and patient medications and                            allergies were reviewed. The patient's tolerance of                            previous anesthesia was also reviewed. The risks                            and benefits of the procedure and the sedation                            options and risks were discussed with the patient.                            All questions were answered, and informed consent                            was obtained. Prior Anticoagulants: The patient has                            taken no anticoagulant or antiplatelet agents. ASA                            Grade Assessment: II - A patient with mild systemic                            disease. After reviewing the risks and benefits,                            the patient was deemed in satisfactory condition to  undergo the procedure.                           After obtaining informed consent, the colonoscope                            was passed under direct vision. Throughout the                            procedure, the patient's blood pressure, pulse, and                            oxygen saturations were monitored continuously. The                            Olympus scope [id number] was  introduced through the                            anus and advanced to the cecum, identified by                            appendiceal orifice and ileocecal valve. The                            colonoscopy was performed without difficulty. The                            patient tolerated the procedure well. The quality                            of the bowel preparation was good (MiraLax +                            Suprep). The ileocecal valve, appendiceal orifice,                            and rectum were photographed. Scope In: 1:23:24 PM Scope Out: 1:39:00 PM Scope Withdrawal Time: 0 hours 9 minutes 22 seconds  Total Procedure Duration: 0 hours 15 minutes 36 seconds  Findings:                 The digital rectal exam was normal.                           The colon (entire examined portion) appeared normal.                           The left colon was moderately tortuous. Complications:            No immediate complications. Estimated Blood Loss:     Estimated blood loss: none. Impression:               - The entire examined colon is normal.                           - Tortuous colon.                           -  No specimens collected. Recommendation:           - Patient has a contact number available for                            emergencies. The signs and symptoms of potential                            delayed complications were discussed with the                            patient. Return to normal activities tomorrow.                            Written discharge instructions were provided to the                            patient.                           - Resume previous diet.                           - Continue present medications.                           - Repeat colonoscopy in 5 years for screening                            purposes with 2 day prep. Beverley Fiedler, MD 06/02/2023 1:49:52 PM This report has been signed electronically.

## 2023-06-02 NOTE — Op Note (Signed)
[facility] Patient Name: Megan [name] Procedure Date: 06/02/2023 12:59 PM MRN: [id number] Endoscopist: Beverley Fiedler , MD, [id number] Age: 67 Referring MD:  Date of Birth: 30-Sep-1955 Gender: Female Account #: [id number] Procedure:                Upper GI endoscopy Indications:              Exclusion of atrophic gastritis in setting of                            pernicious anemia and B12 deficiency Medicines:                Monitored Anesthesia Care Procedure:                Pre-Anesthesia Assessment:                           - Prior to the procedure, a History and Physical                            was performed, and patient medications and                            allergies were reviewed. The patient's tolerance of                            previous anesthesia was also reviewed. The risks                            and benefits of the procedure and the sedation                            options and risks were discussed with the patient.                            All questions were answered, and informed consent                            was obtained. Prior Anticoagulants: The patient has                            taken no anticoagulant or antiplatelet agents. ASA                            Grade Assessment: II - A patient with mild systemic                            disease. After reviewing the risks and benefits,                            the patient was deemed in satisfactory condition to                            undergo the procedure.  After obtaining informed consent, the endoscope was                            passed under direct vision. Throughout the                            procedure, the patient's blood pressure, pulse, and                            oxygen saturations were monitored continuously. The                            Olympus scope [id number] was introduced through the                            mouth, and advanced  to the second part of duodenum.                            The upper GI endoscopy was accomplished without                            difficulty. The patient tolerated the procedure                            well. Scope In: Scope Out: Findings:                 The examined esophagus was normal.                           A 2 cm hiatal hernia was present.                           The gastroesophageal flap valve was visualized                            endoscopically and classified as Honaker Grade IV (no                            fold, wide open lumen, hiatal hernia present).                           Normal mucosa was found in the entire examined                            stomach. Multiple biopsies were obtained with cold                            forceps for histology and Helicobacter pylori                            testing in a targeted manner in the cardia, in the                            gastric fundus,  in the gastric body, at the                            incisura and in the gastric antrum.                           The examined duodenum was normal. Complications:            No immediate complications. Estimated Blood Loss:     Estimated blood loss was minimal. Impression:               - Normal esophagus.                           - 2 cm hiatal hernia.                           - Normal mucosa was found in the entire stomach.                           - Normal examined duodenum.                           - Multiple biopsies were obtained in the cardia, in                            the gastric fundus, in the gastric body, at the                            incisura and in the gastric antrum.                           - Cytology brush used to take tongue brushings to                            exclude oral Candidiasis. This was sent to cytology. Recommendation:           - Patient has a contact number available for                            emergencies. The signs and  symptoms of potential                            delayed complications were discussed with the                            patient. Return to normal activities tomorrow.                            Written discharge instructions were provided to the                            patient.                           - Resume previous diet.                           -  Continue present medications.                           - Await cytology and pathology results.                           - See the other procedure note for documentation of                            additional recommendations. Beverley Fiedler, MD 06/02/2023 1:47:17 PM This report has been signed electronically.

## 2023-06-02 NOTE — Telephone Encounter (Signed)
Returned call to patient. She reports increased gas pain rating the discomfort 5-6 out of 10 . She has been passing air , but not much in the past 1 hr. Encouraged patient to try couple of different laying position to help to get rid of the air and take Simethicone drops. Advised patient to call back if symptoms worsen.  Patient verbalized understanding and agreed to POC. Dr.Pyrtle informed.

## 2023-06-02 NOTE — Telephone Encounter (Signed)
Inbound call from patient stating that she had a colonoscopy and endoscopy today with Dr. Rhea Belton and is having pain and is wanting to discuss. Please advise.

## 2023-06-02 NOTE — Patient Instructions (Addendum)
- Resume previous diet. - Continue present medications. - Repeat colonoscopy in 5 years for screening purposes with 2 day prep. - Await cytology and pathology results.  - Patient given educational handouts related to procedure.  YOU HAD AN ENDOSCOPIC PROCEDURE TODAY AT THE [facility]:   Refer to the procedure report that was given to you for any specific questions about what was found during the examination.  If the procedure report does not answer your questions, please call your gastroenterologist to clarify.  If you requested that your care partner not be given the details of your procedure findings, then the procedure report has been included in a sealed envelope for you to review at your convenience later.  YOU SHOULD EXPECT: Some feelings of bloating in the abdomen. Passage of more gas than usual.  Walking can help get rid of the air that was put into your GI tract during the procedure and reduce the bloating. If you had a lower endoscopy (such as a colonoscopy or flexible sigmoidoscopy) you may notice spotting of blood in your stool or on the toilet paper. If you underwent a bowel prep for your procedure, you may not have a normal bowel movement for a few days.  Please Note:  You might notice some irritation and congestion in your nose or some drainage.  This is from the oxygen used during your procedure.  There is no need for concern and it should clear up in a day or so.  SYMPTOMS TO REPORT IMMEDIATELY:  Following lower endoscopy (colonoscopy or flexible sigmoidoscopy):  Excessive amounts of blood in the stool  Significant tenderness or worsening of abdominal pains  Swelling of the abdomen that is new, acute  Fever of 100F or higher  Following upper endoscopy (EGD)  Vomiting of blood or coffee ground material  New chest pain or pain under the shoulder blades  Painful or persistently difficult swallowing  New shortness of breath  Fever of 100F or higher  Black,  tarry-looking stools  For urgent or emergent issues, a gastroenterologist can be reached at any hour by calling [phone]. Do not use MyChart messaging for urgent concerns.    DIET:  We do recommend a small meal at first, but then you may proceed to your regular diet.  Drink plenty of fluids but you should avoid alcoholic beverages for 24 hours.  ACTIVITY:  You should plan to take it easy for the rest of today and you should NOT DRIVE or use heavy machinery until tomorrow (because of the sedation medicines used during the test).    FOLLOW UP: Our staff will call the number listed on your records the next business day following your procedure.  We will call around 7:15- 8:00 am to check on you and address any questions or concerns that you may have regarding the information given to you following your procedure. If we do not reach you, we will leave a message.     If any biopsies were taken you will be contacted by phone or by letter within the next 1-3 weeks.  Please call us at [phone] if you have not heard about the biopsies in 3 weeks.    SIGNATURES/CONFIDENTIALITY: You and/or your care partner have signed paperwork which will be entered into your electronic medical record.  These signatures attest to the fact that that the information above on your After Visit Summary has been reviewed and is understood.  Full responsibility of the confidentiality of this discharge information  lies with you and/or your care-partner.

## 2023-06-02 NOTE — Progress Notes (Signed)
Called to room to assist during endoscopic procedure.  Patient ID and intended procedure confirmed with present staff. Received instructions for my participation in the procedure from the performing physician.  

## 2023-06-03 ENCOUNTER — Telehealth: Payer: Self-pay

## 2023-06-03 LAB — CYTOLOGY - NON PAP

## 2023-06-03 NOTE — Telephone Encounter (Signed)
  Follow up Call-     06/02/2023   12:47 PM  Call back number  Post procedure Call Back phone  # [phone]  Permission to leave phone message Yes     Patient questions:  Do you have a fever, pain , or abdominal swelling? No. Pain Score  0 *  Have you tolerated food without any problems? Yes.    Have you been able to return to your normal activities? Yes.    Do you have any questions about your discharge instructions: Diet   No. Medications  No. Follow up visit  No.  Do you have questions or concerns about your Care? Yes.  , complaints of abd bloaing and face swelling. Told her she may still have gas from procedure. If swelling gets worse or no improvement advised to go to ER. She thinks swelling may be due to retaining sodium. No other concerns. Tolerating food and no complaints of pain.  Actions: * If pain score is 4 or above: No action needed, pain <4.

## 2023-06-04 ENCOUNTER — Ambulatory Visit (INDEPENDENT_AMBULATORY_CARE_PROVIDER_SITE_OTHER): Payer: Medicare Other | Admitting: Podiatry

## 2023-06-04 DIAGNOSIS — M79674 Pain in right toe(s): Secondary | ICD-10-CM

## 2023-06-04 DIAGNOSIS — M79675 Pain in left toe(s): Secondary | ICD-10-CM

## 2023-06-04 DIAGNOSIS — B351 Tinea unguium: Secondary | ICD-10-CM

## 2023-06-05 ENCOUNTER — Ambulatory Visit: Payer: Medicare Other | Admitting: Podiatry

## 2023-06-05 LAB — SURGICAL PATHOLOGY

## 2023-06-08 ENCOUNTER — Other Ambulatory Visit: Payer: Self-pay

## 2023-06-08 ENCOUNTER — Encounter: Payer: Self-pay | Admitting: Internal Medicine

## 2023-06-08 ENCOUNTER — Other Ambulatory Visit (INDEPENDENT_AMBULATORY_CARE_PROVIDER_SITE_OTHER): Payer: Medicare Other

## 2023-06-08 ENCOUNTER — Ambulatory Visit: Payer: Medicare Other | Admitting: Podiatry

## 2023-06-08 ENCOUNTER — Ambulatory Visit (INDEPENDENT_AMBULATORY_CARE_PROVIDER_SITE_OTHER)
Admission: RE | Admit: 2023-06-08 | Discharge: 2023-06-08 | Disposition: A | Discharge status: Home / Self Care | Payer: Medicare Other | Source: Ambulatory Visit | Attending: Internal Medicine | Admitting: Internal Medicine

## 2023-06-08 DIAGNOSIS — K5909 Other constipation: Secondary | ICD-10-CM

## 2023-06-08 DIAGNOSIS — R1030 Lower abdominal pain, unspecified: Secondary | ICD-10-CM

## 2023-06-08 DIAGNOSIS — K5901 Slow transit constipation: Secondary | ICD-10-CM

## 2023-06-08 DIAGNOSIS — K862 Cyst of pancreas: Secondary | ICD-10-CM

## 2023-06-08 LAB — CBC WITH DIFFERENTIAL/PLATELET
Basophils Absolute: 0 10*3/uL (ref 0.0–0.1)
Basophils Relative: 1.2 % (ref 0.0–3.0)
Eosinophils Absolute: 0 10*3/uL (ref 0.0–0.7)
Eosinophils Relative: 0.7 % (ref 0.0–5.0)
HCT: 41.7 % (ref 36.0–46.0)
Hemoglobin: 13.8 g/dL (ref 12.0–15.0)
Lymphocytes Relative: 30 % (ref 12.0–46.0)
Lymphs Abs: 1.2 10*3/uL (ref 0.7–4.0)
MCHC: 33.2 g/dL (ref 30.0–36.0)
MCV: 92.5 fL (ref 78.0–100.0)
Monocytes Absolute: 0.3 10*3/uL (ref 0.1–1.0)
Monocytes Relative: 6.5 % (ref 3.0–12.0)
Neutro Abs: 2.4 10*3/uL (ref 1.4–7.7)
Neutrophils Relative %: 61.6 % (ref 43.0–77.0)
Platelets: 205 10*3/uL (ref 150.0–400.0)
RBC: 4.51 Mil/uL (ref 3.87–5.11)
RDW: 12.8 % (ref 11.5–15.5)
WBC: 3.9 10*3/uL — ABNORMAL LOW (ref 4.0–10.5)

## 2023-06-08 LAB — COMPREHENSIVE METABOLIC PANEL
ALT: 17 U/L (ref 0–35)
AST: 28 U/L (ref 0–37)
Albumin: 4.7 g/dL (ref 3.5–5.2)
Alkaline Phosphatase: 45 U/L (ref 39–117)
BUN: 12 mg/dL (ref 6–23)
CO2: 28 meq/L (ref 19–32)
Calcium: 9.7 mg/dL (ref 8.4–10.5)
Chloride: 106 meq/L (ref 96–112)
Creatinine, Ser: 0.74 mg/dL (ref 0.40–1.20)
GFR: 83.19 mL/min (ref >60.00)
Glucose, Bld: 85 mg/dL (ref 70–99)
Potassium: 4 meq/L (ref 3.5–5.1)
Sodium: 140 meq/L (ref 135–145)
Total Bilirubin: 0.5 mg/dL (ref 0.2–1.2)
Total Protein: 7.1 g/dL (ref 6.0–8.3)

## 2023-06-08 NOTE — Telephone Encounter (Signed)
With her global GI dysmotility and colonic inertia she is likely just taking a longer amount of time to bounce back Please have her come in for CBC and CMP and a two-view abdominal x-ray, order the x-ray stat please so that it is interpreted in a relatively short amount of time

## 2023-06-10 NOTE — Progress Notes (Signed)
Subjective: 66 year old female presents for concerns of thick, elongated nails.  They do cause discomfort and become elongated.  No swelling redness or drainage the toenail sites.  No other concerns.  Objective: AAO x3, NAD DP/PT pulses palpable bilaterally, CRT less than 3 seconds Nails are hypertrophic, dystrophic, brittle, discolored, elongated 10. No surrounding redness or drainage. Tenderness nails 1-5 bilaterally.  Mild incurvation of the nail borders of the right hallux toenail.  No pain with calf compression, swelling, warmth, erythema  Assessment: Ingrown toenail, symptomatic onychomycosis  Plan: -All treatment options discussed with the patient including all alternatives, risks, complications.  -Sharply debrided the nails x10 without any complications or bleeding.  -Daily foot inspection  Return in about 2 months (around 08/05/2023).  Vivi Barrack DPM

## 2023-06-15 ENCOUNTER — Encounter: Payer: Self-pay | Admitting: Adult Health

## 2023-06-15 ENCOUNTER — Ambulatory Visit (INDEPENDENT_AMBULATORY_CARE_PROVIDER_SITE_OTHER): Payer: Medicare Other | Admitting: Adult Health

## 2023-06-15 VITALS — BP 96/60 | HR 73 | Temp 97.9°F | Ht 69.0 in | Wt 132.2 lb

## 2023-06-15 DIAGNOSIS — J45909 Unspecified asthma, uncomplicated: Secondary | ICD-10-CM | POA: Insufficient documentation

## 2023-06-15 DIAGNOSIS — J4520 Mild intermittent asthma, uncomplicated: Secondary | ICD-10-CM | POA: Diagnosis not present

## 2023-06-15 NOTE — Patient Instructions (Addendum)
Albuterol inhaler or Duoneb as needed for wheezing/asthma flare  Follow up with in 1 year with Dr. Wynona Neat or Kinisha Soper NP and As needed

## 2023-06-15 NOTE — Progress Notes (Signed)
@Patient  ID: Megan [name], female    DOB: Jul 17, 1956, 67 y.o.   MRN: [id number]  Chief Complaint  Patient presents with   Follow-up    Referring provider: Thana Ates, MD  HPI: 67 year old female never smoker followed for asthma and allergic rhinitis Previous history of Severe pneumonia in 2009 with residual atelectasis-required bronchoscopy (Hospitalization)  Has followed with integrative medicine specialist with previous treatment with prolonged antifungal therapy for mold exposure (water leak) and ivermectin for long-haul COVID  TEST/EVENTS :  CT chest April 11, 2022 showed negative PE, mild scarring/atelectasis in the lingula unchanged  PFTs September 19, 2022 showed normal lung function  06/15/2023 Follow up : Ast[place] , [place]  Patient presents for a follow-up visit.  Last seen January 2024.  Patient is followed for intermittent asthma and allergic rhinitis.  Since last visit patient says she is doing well.  She is on albuterol as needed. No uses .  Doing well , remains active. Walks 3 miles daily . Spin class and weights 3 times a week.  Prefers holistic approach for medical treatments.     Allergies  Allergen Reactions   Augmentin [Amoxicillin-Pot Clavulanate] Diarrhea   Clindamycin/Lincomycin Diarrhea   Iron     Other reaction(s): severe constipation   Moxifloxacin Other (See Comments)    neuropathy Other reaction(s): neuropathy   Sodium Citrate     Other Reaction(s): GI Upset (intolerance)    Immunization History  Administered Date(s) Administered   Influenza Split 06/13/2009, 06/25/2010, 03/05/2012, 07/12/2012, 06/17/2013   Influenza,inj,quad, With Preservative 08/04/2014   Influenza-Unspecified 03/05/2012   Janssen (J&J) SARS-COV-2 Vaccination 12/31/2019   Pneumococcal Polysaccharide-23 08/08/2010, 07/26/2015   Td 06/27/2002, 11/08/2013   Tdap 08/31/2003, 11/08/2013    Past Medical History:  Diagnosis Date   Allergic rhinitis    Allergy    Aneurysm  of cardiac wall, congenital    a.  septal aneurysm extending into the RVOT (by cardiac MRI in 08/2013)   Anxiety    Aortic atherosclerosis (HCC)    Arthritis    Asthma    Basal cell carcinoma    Depression    Heart murmur    Hypothyroidism    on meds   Leukopenia    idiopathic   Lyme disease    Mitral valve prolapse    MVP (mitral valve prolapse)    Orthostatic hypotension    Osteoporosis    Pancreatic cyst    Peripheral neuropathy 06/09/2012   Pernicious anemia    Plantar fasciitis    Pneumothorax     Tobacco History: Social History   Tobacco Use  Smoking Status Never  Smokeless Tobacco Never   Counseling given: Not Answered   Outpatient Medications Prior to Visit  Medication Sig Dispense Refill   5-Hydroxytryptophan (5-HTP) 100 MG CAPS Take 100 mg by mouth daily.      albuterol (VENTOLIN HFA) 108 (90 Base) MCG/ACT inhaler Inhale 1 puff into the lungs daily as needed for wheezing or shortness of breath.      AMBULATORY NON FORMULARY MEDICATION 3 (three) times daily. Medication Name: Amino acids double wood     ARMOUR THYROID 30 MG tablet Take 1 tablet (30 mg total) by mouth daily. 90 tablet 3   Ascorbic Acid (VITAMIN C) POWD Take 2,500 mg by mouth daily.     Azelaic Acid 15 % gel SMARTSIG:sparingly Topical Twice Daily     Cholecalciferol (VITAMIN D3) 10000 units capsule Take 10,000 Units by mouth daily.  Cyanocobalamin (VITAMIN B-12 ER PO) Take 5,000 mcg by mouth daily.      Cyanocobalamin (VITAMIN B-12 IJ) Inject 2,500 mcg as directed once a week.     estradiol (ESTRACE) 0.1 MG/GM vaginal cream Place 1 Applicatorful vaginally 3 (three) times a week.     estradiol (ESTRACE) 1 MG tablet Take 1 mg by mouth daily.     MAGNESIUM CITRATE PO Take 600 mg by mouth daily.     MAGNESIUM OXIDE PO Take 290 mg by mouth daily.     Menaquinone-7 (VITAMIN K2) 100 MCG CAPS Take 1 capsule by mouth daily.     Multiple Vitamin (MULTIVITAMIN) capsule Take 1 capsule by mouth daily.      NON FORMULARY Take 1,000 mcg by mouth daily. Methyl Folate chewable tablet     NONFORMULARY OR COMPOUNDED ITEM Exosome 1 trillion, I injection monthy     progesterone (PROMETRIUM) 100 MG capsule Take 1.5 capsules by mouth daily.     TESTOSTERONE UNDECANOATE PO Take by mouth daily.     azithromycin (ZITHROMAX) 250 MG tablet Take by mouth.     celecoxib (CELEBREX) 100 MG capsule Take 100 mg by mouth every 12 (twelve) hours.     gabapentin (NEURONTIN) 100 MG capsule Take 100 mg by mouth 3 (three) times daily.     nystatin (MYCOSTATIN) 100000 UNIT/ML suspension Take 5 mLs by mouth 4 (four) times daily.     ondansetron (ZOFRAN-ODT) 4 MG disintegrating tablet Take 4 mg by mouth every 8 (eight) hours as needed.     No facility-administered medications prior to visit.     Review of Systems:   Constitutional:   No  weight loss, night sweats,  Fevers, chills, fatigue, or  lassitude.  HEENT:   No headaches,  Difficulty swallowing,  Tooth/dental problems, or  Sore throat,                No sneezing, itching, ear ache, nasal congestion, post nasal drip,   CV:  No chest pain,  Orthopnea, PND, swelling in lower extremities, anasarca, dizziness, palpitations, syncope.   GI  No heartburn, indigestion, abdominal pain, nausea, vomiting, diarrhea, change in bowel habits, loss of appetite, bloody stools.   Resp: No shortness of breath with exertion or at rest.  No excess mucus, no productive cough,  No non-productive cough,  No coughing up of blood.  No change in color of mucus.  No wheezing.  No chest wall deformity  Skin: no rash or lesions.  GU: no dysuria, change in color of urine, no urgency or frequency.  No flank pain, no hematuria   MS:  No joint pain or swelling.  No decreased range of motion.  No back pain.    Physical Exam  BP 96/60 (BP Location: Left Arm, Patient Position: Sitting, Cuff Size: Normal)   Pulse 73   Temp 97.9 F (36.6 C) (Oral)   Ht 5\' 9"  (1.753 m)   Wt 132 lb 3.2  oz (60 kg)   SpO2 99%   BMI 19.52 kg/m   GEN: A/Ox3; pleasant , NAD, well nourished    HEENT:  [place]/AT,  EACs-clear, TMs-wnl, NOSE-clear, THROAT-clear, no lesions, no postnasal drip or exudate noted.   NECK:  Supple w/ fair ROM; no JVD; normal carotid impulses w/o bruits; no thyromegaly or nodules palpated; no lymphadenopathy.    RESP  Clear  P & A; w/o, wheezes/ rales/ or rhonchi. no accessory muscle use, no dullness to percussion  CARD:  RRR, no m/r/g,  no peripheral edema, pulses intact, no cyanosis or clubbing.  GI:   Soft & nt; nml bowel sounds; no organomegaly or masses detected.   Musco: Warm bil, no deformities or joint swelling noted.   Neuro: alert, no focal deficits noted.    Skin: Warm, no lesions or rashes    Lab Results:  CBC    BNP No results found for: "BNP"  ProBNP No results found for: "PROBNP"  Imaging: DG Abd 2 Views  Result Date: 06/08/2023 CLINICAL DATA:  Abdominal pain.  Nausea EXAM: ABDOMEN - 2 VIEW COMPARISON:  Radiograph dated 07/25/2018. FINDINGS: There is large amount of stool throughout the colon. No bowel dilatation or evidence of obstruction. No free air or radiopaque calculi. The osseous structures are intact. The soft tissues are unremarkable. IMPRESSION: Constipation.  No bowel obstruction. Electronically Signed   By: Elgie Collard M.D.   On: 06/08/2023 18:16    Administration History     None          Latest Ref Rng & Units 09/19/2022   12:51 PM  PFT Results  FVC-Pre L 3.88   FVC-Predicted Pre % 104   FVC-Post L 3.91   FVC-Predicted Post % 104   Pre FEV1/FVC % % 73   Post FEV1/FCV % % 75   FEV1-Pre L 2.85   FEV1-Predicted Pre % 100   FEV1-Post L 2.93     No results found for: "NITRICOXIDE"      Assessment & Plan:   Asthma Mild intermittent asthma appears to be under good control.  Asthma action plan discussed.  Trigger prevention.  Plan  Patient Instructions  Albuterol inhaler or Duoneb as needed for  wheezing/asthma flare  Follow up with in 1 year with Dr. Wynona Neat or Kumar Falwell NP and As needed          Rubye Oaks, NP 06/15/2023

## 2023-06-15 NOTE — Assessment & Plan Note (Signed)
Mild intermittent asthma appears to be under good control.  Asthma action plan discussed.  Trigger prevention.  Plan  Patient Instructions  Albuterol inhaler or Duoneb as needed for wheezing/asthma flare  Follow up with in 1 year with Dr. Wynona Neat or Beya Tipps NP and As needed

## 2023-07-01 ENCOUNTER — Encounter: Payer: Self-pay | Admitting: Internal Medicine

## 2023-08-10 ENCOUNTER — Ambulatory Visit: Payer: Medicare Other | Admitting: Podiatry

## 2023-08-21 ENCOUNTER — Encounter: Payer: Self-pay | Admitting: Internal Medicine

## 2023-08-24 ENCOUNTER — Ambulatory Visit (INDEPENDENT_AMBULATORY_CARE_PROVIDER_SITE_OTHER): Payer: Medicare Other | Admitting: Podiatry

## 2023-08-24 ENCOUNTER — Other Ambulatory Visit: Payer: Self-pay

## 2023-08-24 DIAGNOSIS — M79674 Pain in right toe(s): Secondary | ICD-10-CM | POA: Diagnosis not present

## 2023-08-24 DIAGNOSIS — B351 Tinea unguium: Secondary | ICD-10-CM | POA: Diagnosis not present

## 2023-08-24 DIAGNOSIS — M79675 Pain in left toe(s): Secondary | ICD-10-CM

## 2023-08-24 MED ORDER — NA SULFATE-K SULFATE-MG SULF 17.5-3.13-1.6 GM/177ML PO SOLN
1.0000 | Freq: Once | ORAL | 0 refills | Status: AC
Start: 2023-08-24 — End: 2023-08-24

## 2023-08-24 NOTE — Progress Notes (Signed)
Subjective: Chief Complaint  Patient presents with   RFC    RM#98 RFC    67 year old female presents for concerns of thick, elongated nails.  They do cause discomfort and become elongated.  She felt they were trimmed too short last time she puts her appointment.  No swelling redness or drainage the toenail sites.  No other concerns.  Objective: AAO x3, NAD DP/PT pulses palpable bilaterally, CRT less than 3 seconds Nails are hypertrophic, dystrophic, brittle, discolored, elongated 10. No surrounding redness or drainage. Tenderness nails 1-5 bilaterally.  Mild incurvation of the nail borders of the right hallux toenail.  No pain with calf compression, swelling, warmth, erythema  Assessment: Ingrown toenail, symptomatic onychomycosis  Plan: -All treatment options discussed with the patient including all alternatives, risks, complications.  -Sharply debrided the nails x10 without any complications or bleeding the patient comfort. -Daily foot inspection  Return in about 9 weeks (around 10/26/2023).  Vivi Barrack DPM

## 2023-08-24 NOTE — Telephone Encounter (Signed)
Okay to prescribe generic Suprep as a bowel purge

## 2023-08-25 ENCOUNTER — Other Ambulatory Visit: Payer: Self-pay | Admitting: Internal Medicine

## 2023-08-25 DIAGNOSIS — R1084 Generalized abdominal pain: Secondary | ICD-10-CM

## 2023-08-25 DIAGNOSIS — R140 Abdominal distension (gaseous): Secondary | ICD-10-CM

## 2023-08-25 NOTE — Progress Notes (Signed)
Pt did not attend appt.

## 2023-08-28 ENCOUNTER — Ambulatory Visit
Admission: RE | Admit: 2023-08-28 | Discharge: 2023-08-28 | Disposition: A | Discharge status: Home / Self Care | Payer: Medicare Other | Source: Ambulatory Visit | Attending: Internal Medicine | Admitting: Internal Medicine

## 2023-08-28 DIAGNOSIS — R1084 Generalized abdominal pain: Secondary | ICD-10-CM

## 2023-08-28 DIAGNOSIS — R140 Abdominal distension (gaseous): Secondary | ICD-10-CM

## 2023-09-07 ENCOUNTER — Telehealth: Payer: Self-pay

## 2023-09-07 NOTE — Telephone Encounter (Signed)
 Spoke with Megan [name] regarding her referral to GYN oncology. She has an appointment scheduled with Dr. Eldonna on 09/28/23 at 9:00. Patient agrees to date and time. She has been provided with office address and location. She is also aware of our mask and visitor policy. Patient verbalized understanding and will call with any questions.

## 2023-09-11 ENCOUNTER — Encounter: Payer: Self-pay | Admitting: Nurse Practitioner

## 2023-09-11 ENCOUNTER — Other Ambulatory Visit (INDEPENDENT_AMBULATORY_CARE_PROVIDER_SITE_OTHER): Payer: Medicare Other

## 2023-09-11 ENCOUNTER — Ambulatory Visit (INDEPENDENT_AMBULATORY_CARE_PROVIDER_SITE_OTHER): Payer: Medicare Other | Admitting: Nurse Practitioner

## 2023-09-11 VITALS — BP 94/60 | HR 64 | Ht 69.0 in

## 2023-09-11 DIAGNOSIS — R194 Change in bowel habit: Secondary | ICD-10-CM | POA: Diagnosis not present

## 2023-09-11 DIAGNOSIS — R1030 Lower abdominal pain, unspecified: Secondary | ICD-10-CM | POA: Diagnosis not present

## 2023-09-11 DIAGNOSIS — K862 Cyst of pancreas: Secondary | ICD-10-CM

## 2023-09-11 DIAGNOSIS — R109 Unspecified abdominal pain: Secondary | ICD-10-CM | POA: Diagnosis not present

## 2023-09-11 DIAGNOSIS — R1031 Right lower quadrant pain: Secondary | ICD-10-CM

## 2023-09-11 LAB — CBC WITH DIFFERENTIAL/PLATELET
Basophils Absolute: 0 10*3/uL (ref 0.0–0.1)
Basophils Relative: 0.9 % (ref 0.0–3.0)
Eosinophils Absolute: 0 10*3/uL (ref 0.0–0.7)
Eosinophils Relative: 0.8 % (ref 0.0–5.0)
HCT: 42.4 % (ref 36.0–46.0)
Hemoglobin: 14 g/dL (ref 12.0–15.0)
Lymphocytes Relative: 29.1 % (ref 12.0–46.0)
Lymphs Abs: 1.1 10*3/uL (ref 0.7–4.0)
MCHC: 33.1 g/dL (ref 30.0–36.0)
MCV: 93.7 fL (ref 78.0–100.0)
Monocytes Absolute: 0.3 10*3/uL (ref 0.1–1.0)
Monocytes Relative: 8.2 % (ref 3.0–12.0)
Neutro Abs: 2.3 10*3/uL (ref 1.4–7.7)
Neutrophils Relative %: 61 % (ref 43.0–77.0)
Platelets: 183 10*3/uL (ref 150.0–400.0)
RBC: 4.53 Mil/uL (ref 3.87–5.11)
RDW: 13.5 % (ref 11.5–15.5)
WBC: 3.7 10*3/uL — ABNORMAL LOW (ref 4.0–10.5)

## 2023-09-11 LAB — COMPREHENSIVE METABOLIC PANEL
ALT: 11 U/L (ref 0–35)
AST: 23 U/L (ref 0–37)
Albumin: 4.8 g/dL (ref 3.5–5.2)
Alkaline Phosphatase: 44 U/L (ref 39–117)
BUN: 13 mg/dL (ref 6–23)
CO2: 25 meq/L (ref 19–32)
Calcium: 9.1 mg/dL (ref 8.4–10.5)
Chloride: 106 meq/L (ref 96–112)
Creatinine, Ser: 0.63 mg/dL (ref 0.40–1.20)
GFR: 91.04 mL/min (ref >60.00)
Glucose, Bld: 75 mg/dL (ref 70–99)
Potassium: 3.6 meq/L (ref 3.5–5.1)
Sodium: 140 meq/L (ref 135–145)
Total Bilirubin: 0.6 mg/dL (ref 0.2–1.2)
Total Protein: 6.9 g/dL (ref 6.0–8.3)

## 2023-09-11 LAB — C-REACTIVE PROTEIN: CRP: <1 mg/dL (ref 0.5–20.0)

## 2023-09-11 NOTE — Progress Notes (Signed)
Addendum: Reviewed and agree with assessment and management plan. Kadijah Shamoon M, MD  

## 2023-09-11 NOTE — Patient Instructions (Addendum)
Our office will contact your to schedule your CT with oral contrast.  Your provider has requested that you go to the basement level for lab work before leaving today. Press "B" on the elevator. The lab is located at the first door on the left as you exit the elevator.  Due to recent changes in healthcare laws, you may see the results of your imaging and laboratory studies on MyChart before your provider has had a chance to review them.  We understand that in some cases there may be results that are confusing or concerning to you. Not all laboratory results come back in the same time frame and the provider may be waiting for multiple results in order to interpret others.  Please give [place] 48 hours in order for your provider to thoroughly review all the results before contacting the office for clarification of your results.   Thank you for trusting me with your gastrointestinal care!   Alcide Evener, CRNP

## 2023-09-11 NOTE — Progress Notes (Signed)
09/11/2023 Megan [name] [id number] 06-Jun-1956   Chief Complaint: Constipation  History of Present Illness:  Megan Byers. [name] is a 68 year old female with a pst medical history of anxiety, hypothyroidism, small pancreatic cysts (possible sidebranch IPMN), idiopathic leukopenia, pernicious anemia and chronic constipation. She is followed by Dr. Rhea Belton.  She presents today for follow-up regarding constipation with lower abdominal pain and bloat potentially triggered after compounded HRT dose was altered 05/2023.  She sees an integrative provider who prescribed a compounded hormone formulation which was compounded at a different pharmacy which triggered headaches, breast pain, ankle swelling and vaginal bleeding.  She stated her estradiol level went from 28-1 20, testosterone 21 09/25/2010 and progesterone level remained the same.  During this time she noticed her bowel pattern was not as regular. Since then, her compounded hormone therapy was altered and her hormone levels corrected but she continued to have issues with constipation.  She was seen by her PCP and abdominal x-ray 08/25/2023 showed stool throughout the colon with a nonobstructive bowel gas pattern.  She was scheduled for pelvic sonogram and she was prescribed Duca locks 3 tablets on 1/1 and she took 1 dose of Suprep at 8 AM on 08/26/2022 without passing a bowel movement.  She awakened at 3 AM on 1/3 with left-sided chest pain then at 5 AM she passed a normal regular bowel movement followed by watery diarrhea with bits of stool around 6 or 7 AM.  She proceeded to have the pelvic sonogram and she was informed she still had a lot of stool in the colon at that time.  Her chest pain abated without recurrence.  She remains on magnesium 2 tabs daily and 2 weeks ago she started taking cod liver oil, triphala herbal colon cleanse, 5HTP and digestive enzymes and her bowel pattern has improved, she has passed a normal BM for the past 2 days. She often  feels full after passing a BM and continues to have intermittent lower abdominal pain.  Her most recent colonoscopy was 05/2023 which showed a torturous colon otherwise was normal.  Mother with history of colon cancer.  Labs 06/08/2023: WBC 3.9.  Hemoglobin 13.8.  Hematocrit 41.7.  Platelet 205.  Glucose 75.  BUN 13.  Creatinine 0.63.  Sodium 140.  Potassium 3.6.  Total bili 0.6.  Alk phos 44.  AST 23.  ALT .  Pelvic sonogram 08/28/2023: 1. Indeterminate, ill-defined hypoechoic lesion within the cervical region with low level internal blood flow, not typical for a nabothian cyst. Recommend further evaluation with direct visualization to exclude mass/polyp. 2. No evidence of endometrial thickening or focal myometrial abnormality. 3. Both ovaries appear unremarkable.  MRI ABDOMEN WITHOUT CONTRAST  (INCLUDING MRCP) 11/16/2022:  Unanged fluid signal cystic lesions in the medial pancreatic head and pancreatic tail measuring up to 1.0 cm. These are most likely small side branch IPMNs or pseudocysts. As there is no observed increased risk of malignancy for such lesions smaller than 2 cm, particularly given well established stability, no specific further follow-up or characterization is required.   PAST GI PROCEDURES:  Colonoscopy 06/02/2023: - The entire examined colon is normal. - Tortuous colon. - No specimens collected  EGD 06/02/2023: - Normal esophagus.  2 cm hiatal hernia. - Normal mucosa was found in the entire stomach.  - Normal examined duodenum.  - Multiple biopsies were obtained in the cardia, in the gastric fundus, in the gastric body, at the incisura and in the gastric antrum.  -  Cytology brush used to take tongue brushings to exclude oral Candidiasis, results showed benign reactive/reparative changes without evidence of candidiasis -Gastric antrum, gastric body  biopsies negative for H. pylori, intestinal metaplasia, dysplasia or malignancy  Colonoscopy 03/04/2018: - The entire  examined colon is normal on direct and retroflexion views. - No specimens collected. - 5 year colonoscopy recall   Current Outpatient Medications on File Prior to Visit  Medication Sig Dispense Refill   5-Hydroxytryptophan (5-HTP) 100 MG CAPS Take 100 mg by mouth daily.      albuterol (VENTOLIN HFA) 108 (90 Base) MCG/ACT inhaler Inhale 1 puff into the lungs daily as needed for wheezing or shortness of breath.      AMBULATORY NON FORMULARY MEDICATION 3 (three) times daily. Medication Name: Amino acids double wood     AMBULATORY NON FORMULARY MEDICATION Traphala 250 mg Take 2 capsule by mouth daily     ARMOUR THYROID 30 MG tablet Take 1 tablet (30 mg total) by mouth daily. 90 tablet 3   Ascorbic Acid (VITAMIN C) POWD Take 2,500 mg by mouth daily.     Cholecalciferol (VITAMIN D3) 10000 units capsule Take 10,000 Units by mouth daily.      Cyanocobalamin (VITAMIN B-12 ER PO) Take 5,000 mcg by mouth daily.      Cyanocobalamin (VITAMIN B-12 IJ) Inject 5,000 mcg as directed once a week.     Digestive Enzymes (DIGESTIVE ENZYME PO) Take 2 capsules by mouth 2 (two) times daily before a meal.     estradiol (ESTRACE) 0.1 MG/GM vaginal cream Place 1 Applicatorful vaginally 3 (three) times a week.     ESTRADIOL TD Place 0.3125 mg onto the skin daily.     MAGNESIUM CITRATE PO Take 600 mg by mouth daily.     MAGNESIUM OXIDE PO Take 290 mg by mouth daily.     Menaquinone-7 (VITAMIN K2) 100 MCG CAPS Take 1 capsule by mouth daily.     Multiple Vitamin (MULTIVITAMIN) capsule Take 1 capsule by mouth daily.     NON FORMULARY Take 1,000 mcg by mouth daily. Methyl Folate chewable tablet     NONFORMULARY OR COMPOUNDED ITEM Exosome 1 trillion, I injection monthy     progesterone (PROMETRIUM) 100 MG capsule Take 1.5 capsules by mouth daily.     TESTOSTERONE UNDECANOATE PO Take 1 mg by mouth daily.     No current facility-administered medications on file prior to visit.   Allergies  Allergen Reactions   Augmentin  [Amoxicillin-Pot Clavulanate] Diarrhea   Clindamycin/Lincomycin Diarrhea   Iron     Other reaction(s): severe constipation   Moxifloxacin Other (See Comments)    neuropathy Other reaction(s): neuropathy   Sodium Citrate     Other Reaction(s): GI Upset (intolerance)   Current Medications, Allergies, Past Medical History, Past Surgical History, Family History and Social History were reviewed in [name] record.  Review of Systems:   Constitutional: Negative for fever, sweats, chills or weight loss.  Respiratory: Negative for shortness of breath.   Cardiovascular: Negative for chest pain, palpitations and leg swelling.  Gastrointestinal: See HPI.  Musculoskeletal: Negative for back pain or muscle aches.  Neurological: Negative for dizziness, headaches or paresthesias.    Physical Exam: BP 94/60 (BP Location: Left Arm, Patient Position: Sitting, Cuff Size: Normal)   Pulse 64   Ht 5\' 9"  (1.753 m)   BMI 19.52 kg/m   Wt Readings from Last 3 Encounters:  06/15/23 132 lb 3.2 oz (60 kg)  06/02/23 129 lb (  58.5 kg)  05/26/23 129 lb 9.6 oz (58.8 kg)    General: 68 year old female in no acute distress. Head: Normocephalic and atraumatic. Eyes: No scleral icterus. Conjunctiva pink . Ears: Normal auditory acuity. Mouth: Dentition intact. No ulcers or lesions.  Lungs: Clear throughout to auscultation. Heart: Regular rate and rhythm, no murmur. Abdomen: Soft, nontender.  Mild tenderness above the umbilicus and to the LLQ and central lower abdomen without rebound or guarding.  No masses or hepatomegaly. Normal bowel sounds x 4 quadrants.  Rectal: Deferred. Musculoskeletal: Symmetrical with no gross deformities. Extremities: No edema. Neurological: Alert oriented x 4. No focal deficits.  Psychological: Alert and cooperative. Normal mood and affect  Assessment and Recommendations:  68 year old female with a change in bowel pattern/constipation, possibly triggered  after compounded HRT dose was altered.  She is taking  cod liver oil, triphala herbal colon cleanse, 5HTP and digestive enzymes and she is passing normal formed stool for the past 2 days. -Patient to monitor bowel pattern, may take senna tea or senna laxative of choice as needed -Dietary fiber as tolerated -Drink 64 ounces of water daily  Lower abdominal pain.  Mild tenderness above the umbilicus into the RLQ and central lower abdomen without rebound or guarding on exam today. -CTAP with oral contrast only as patient did not wish to receive IV contrast -CBC, CMP and CRP -Patient to contact office if abdominal pain worsens  Colon cancer screening.  Colonoscopy 06/02/2023 showed a torturous colon without polyps. -Next colonoscopy due 05/2028 secondary to first-degree relative with history of colon cancer  Pancreatic cyst/possible sidebranch IPMN, stable.  MRI/MRCP 11/16/2022 reassuring.  No further imaging recommended as previously noted by Dr. Rhea Belton.  Pelvic sonogram 08/28/2023 showed an indeterminant lesion within the cervical region -Follow-up with GYN

## 2023-09-12 ENCOUNTER — Other Ambulatory Visit: Payer: Self-pay | Admitting: Nurse Practitioner

## 2023-09-12 ENCOUNTER — Ambulatory Visit (HOSPITAL_COMMUNITY)
Admission: RE | Admit: 2023-09-12 | Discharge: 2023-09-12 | Disposition: A | Discharge status: Home / Self Care | Payer: Medicare Other | Source: Ambulatory Visit | Attending: Nurse Practitioner | Admitting: Nurse Practitioner

## 2023-09-12 DIAGNOSIS — R1031 Right lower quadrant pain: Secondary | ICD-10-CM | POA: Diagnosis present

## 2023-09-12 DIAGNOSIS — R1030 Lower abdominal pain, unspecified: Secondary | ICD-10-CM | POA: Diagnosis present

## 2023-09-12 DIAGNOSIS — R194 Change in bowel habit: Secondary | ICD-10-CM

## 2023-09-12 DIAGNOSIS — R109 Unspecified abdominal pain: Secondary | ICD-10-CM | POA: Insufficient documentation

## 2023-09-15 ENCOUNTER — Telehealth: Payer: Self-pay | Admitting: Nurse Practitioner

## 2023-09-15 NOTE — Telephone Encounter (Signed)
Left message for pt to call back  °

## 2023-09-15 NOTE — Telephone Encounter (Signed)
Megan [name], pls contact patient, further recommendations to be determined after I receive her abdominal/pelvic CT scan results, CT was done on 09/02/2023, results pending. If CTAP unrevealing, I will order a SIBO test. Stop the cod liver oil, triphala and magnesium citrated. She can continue Magnesium oxide. Start Ibgard (peppermint oil) otc one capsule po twice daily. Patient to go to the ED if she develops severe abdominal pain. THX.

## 2023-09-15 NOTE — Telephone Encounter (Signed)
Patient is returning your call.  

## 2023-09-16 NOTE — Telephone Encounter (Signed)
Pt made aware of Colleen Kennedy Smith NP recommendations: Pt verbalized understanding with all questions answered.    

## 2023-09-16 NOTE — Telephone Encounter (Signed)
Spoke to pt. Documented in my chart message. Pt verbalized understanding with all questions answered.

## 2023-09-25 ENCOUNTER — Encounter: Payer: Self-pay | Admitting: Psychiatry

## 2023-09-28 ENCOUNTER — Inpatient Hospital Stay: Payer: Medicare Other | Attending: Psychiatry | Admitting: Psychiatry

## 2023-09-28 ENCOUNTER — Encounter: Payer: Self-pay | Admitting: Psychiatry

## 2023-09-28 VITALS — BP 101/66 | HR 65 | Temp 98.0°F | Resp 16 | Ht 69.0 in | Wt 134.0 lb

## 2023-09-28 DIAGNOSIS — Z79890 Hormone replacement therapy (postmenopausal): Secondary | ICD-10-CM | POA: Insufficient documentation

## 2023-09-28 DIAGNOSIS — Z803 Family history of malignant neoplasm of breast: Secondary | ICD-10-CM | POA: Diagnosis not present

## 2023-09-28 DIAGNOSIS — N950 Postmenopausal bleeding: Secondary | ICD-10-CM | POA: Insufficient documentation

## 2023-09-28 DIAGNOSIS — E039 Hypothyroidism, unspecified: Secondary | ICD-10-CM | POA: Diagnosis not present

## 2023-09-28 DIAGNOSIS — R300 Dysuria: Secondary | ICD-10-CM | POA: Diagnosis not present

## 2023-09-28 DIAGNOSIS — R011 Cardiac murmur, unspecified: Secondary | ICD-10-CM | POA: Insufficient documentation

## 2023-09-28 DIAGNOSIS — I700 Atherosclerosis of aorta: Secondary | ICD-10-CM | POA: Insufficient documentation

## 2023-09-28 DIAGNOSIS — K5900 Constipation, unspecified: Secondary | ICD-10-CM | POA: Diagnosis not present

## 2023-09-28 DIAGNOSIS — M1990 Unspecified osteoarthritis, unspecified site: Secondary | ICD-10-CM | POA: Insufficient documentation

## 2023-09-28 DIAGNOSIS — J45909 Unspecified asthma, uncomplicated: Secondary | ICD-10-CM | POA: Diagnosis not present

## 2023-09-28 DIAGNOSIS — Z85828 Personal history of other malignant neoplasm of skin: Secondary | ICD-10-CM | POA: Diagnosis not present

## 2023-09-28 DIAGNOSIS — Z8042 Family history of malignant neoplasm of prostate: Secondary | ICD-10-CM | POA: Insufficient documentation

## 2023-09-28 DIAGNOSIS — F419 Anxiety disorder, unspecified: Secondary | ICD-10-CM | POA: Diagnosis not present

## 2023-09-28 DIAGNOSIS — Z801 Family history of malignant neoplasm of trachea, bronchus and lung: Secondary | ICD-10-CM | POA: Diagnosis not present

## 2023-09-28 DIAGNOSIS — R140 Abdominal distension (gaseous): Secondary | ICD-10-CM | POA: Diagnosis not present

## 2023-09-28 DIAGNOSIS — F32A Depression, unspecified: Secondary | ICD-10-CM | POA: Insufficient documentation

## 2023-09-28 DIAGNOSIS — A6920 Lyme disease, unspecified: Secondary | ICD-10-CM | POA: Diagnosis not present

## 2023-09-28 DIAGNOSIS — Z800 Family history of malignant neoplasm of digestive organs: Secondary | ICD-10-CM | POA: Insufficient documentation

## 2023-09-28 DIAGNOSIS — N840 Polyp of corpus uteri: Secondary | ICD-10-CM | POA: Insufficient documentation

## 2023-09-28 DIAGNOSIS — M810 Age-related osteoporosis without current pathological fracture: Secondary | ICD-10-CM | POA: Insufficient documentation

## 2023-09-28 DIAGNOSIS — I341 Nonrheumatic mitral (valve) prolapse: Secondary | ICD-10-CM | POA: Insufficient documentation

## 2023-09-28 DIAGNOSIS — J309 Allergic rhinitis, unspecified: Secondary | ICD-10-CM | POA: Insufficient documentation

## 2023-09-28 LAB — LAB REPORT - SCANNED
EGFR: 95
Free T4: 0.99 ng/dL
TSH: 4.04 (ref 0.41–5.90)

## 2023-09-28 NOTE — Telephone Encounter (Signed)
Chart reviewed and noted the following documentation.  September 27, 2023 Arnaldo Natal, NP to [facility] "Megan [name]" Regarding result: CT ABDOMEN PELVIS WO CONTRAST      09/27/23  1:02 PM Dear [name], there is no defined time frame regarding how long constipation lasts. I understand you are not wishing to try Linzess at his juncture. You can continue your preferred regimen as you noted in your message. I would not order the SIBO breath test if you are still constipated. If you are having good stool output every day or at least every other day but still feel bloated, then SIBO can be ordered. The Ibgard is a natural treatment for abdominal bloat, consider trying that in the near future.  Sincerely, Megan [name] CRNP  Last read by Claudette Head "[name]" at  1:41 PM on 09/27/2023.

## 2023-09-28 NOTE — Progress Notes (Unsigned)
GYNECOLOGIC ONCOLOGY NEW PATIENT CONSULTATION  Date of Service: 09/28/2023 Referring Provider: Olivia Mackie, MD   ASSESSMENT AND PLAN: Megan [name] is a 68 y.o. woman with ultrasound imaging showing a ~1cm endometrial versus endocervical polypoid mass.  Reviewed imaging findings in detail with patient.  Overall, her imaging is reassuring to be unlikely contributing to her underlies abdominal symptoms including bloating, constipation.  However, in a postmenopausal woman, it is reasonable to further evaluate the polyp to rule out any premalignant or malignant process.  Her prior endometrial biopsy after her episode of postmenopausal bleeding with adjustment of for hormone replacement occasions was insufficient.  Only contained portions of superficial endometrial tissue.  My recommendation to complete the workup and evaluation of this lesion would be to likely undergo hysteroscopy, D&C/polypectomy.  This could be performed with gynecology.  If negative and no further episodes of postmenopausal bleeding, feel that her workup would be complete and would not require ongoing surveillance.  All questions answered to patient's satisfaction.  A copy of this note was sent to the patient's referring provider.  [name], MD Gynecologic Oncology   Medical Decision Making I personally spent  TOTAL 70 minutes face-to-face and non-face-to-face in the care of this patient, which includes all pre, intra, and post visit time on the date of service.   ------------  CC: Uterine verse cervical lesion  HISTORY OF PRESENT ILLNESS:  Megan [name] is a 68 y.o. woman who is seen in consultation at the request of Olivia Mackie, MD for evaluation of ultrasound with uterine for cervical lesion, prior postmenopausal bleeding, pelvic discomfort.  Patient has been following with her OB/GYN.  She is on hormone replacement with estrogen and progesterone with an integrative medicine group for her  osteoporosis.  She has been followed for an episode of postmenopausal bleeding.  She has also experienced pelvic discomfort and abdominal bloating with constipation.  She has undergone several ultrasounds:  10/11/2022-EMS 2 mm, 1.2 x 0.4 x 1.0 cm polypoid lesion in the endometrial cavity (LUS), normal ovaries 10/31/2022-EMS 1.8 mm 08/04/2023-EMS 3.1 mm, 0.8 x 0.5 x 0.6 cm polyp 08/28/2023-EMS 1.4 mm, normal ovaries, 8x58mm indeterminate, ill-defined hypoechoic lesion within cervical region  She also underwent an endometrial biopsy on 10/31/2022 which showed rare squamous epithelial cells, rare strips of endocervical epithelium, occasional portions of superficial endometrial tissue.  Endometrial glandular formations and stromal tissue not readily identified negative for atypia and malignancy.  Today patient reports she has been dealing with severe constipation.  She follows with a GI doctor who recommended a pelvic ultrasound which was performed in February and showed possible polyp.  She was then sent to the OB/GYN and the repeat ultrasound and endometrial biopsy was performed at that time.  More recently in October 2024 she switched compounding pharmacies for her hormone replacement and experienced an episode of postmenopausal bleeding.  A pelvic ultrasound was performed.  This was repeated in January with the questionable ill-defined lesion within the cervical region.  Her PCP suggested that she make sure that this is evaluated further.  She has had no further bleeding since October.  She continues with sublingual estrogen, progesterone, and testosterone for her bones.  She reports her levels have been within more appropriate range lately.   PAST MEDICAL HISTORY: Past Medical History:  Diagnosis Date   Allergic rhinitis    Allergy    Aneurysm of cardiac wall, congenital    a.  septal aneurysm extending into the RVOT (by cardiac MRI in 08/2013)  Anxiety    Aortic atherosclerosis (HCC)    Arthritis     Asthma    Basal cell carcinoma    Depression    Heart murmur    Hypothyroidism    on meds   Leukopenia    idiopathic   Lyme disease    Mitral valve prolapse    MVP (mitral valve prolapse)    Orthostatic hypotension    Osteoporosis    Pancreatic cyst    Peripheral neuropathy 06/09/2012   Pernicious anemia    Plantar fasciitis    Pneumothorax     PAST SURGICAL HISTORY: Past Surgical History:  Procedure Laterality Date   COLONOSCOPY  2019   JMP-   COLONOSCOPY WITH PROPOFOL N/A 02/22/2013   Procedure: COLONOSCOPY WITH PROPOFOL;  Surgeon: Charolett Bumpers, MD;  Location: [facility];  Service: Endoscopy;  Laterality: N/A;   MOUTH SURGERY  01/2012   bone graft in mouth   NASAL SINUS SURGERY Right 06/12/2014   Procedure: RIGHT ENDOSCOPIC MAXILLARY ANTROSTOMY;  Surgeon: Darletta Moll, MD;  Location: [facility];  Service: ENT;  Laterality: Right;   ROOT CANAL  02/27/2012   TONSILLECTOMY  1962   WISDOM TOOTH EXTRACTION      OB/GYN HISTORY: OB History  Gravida Para Term Preterm AB Living  0 0 0 0 0 0  SAB IAB Ectopic Multiple Live Births  0 0 0 0 0      Age at menarche: 36 Age at menopause: 30 Hx of HRT: Current, 3 years Hx of STI: no Last pap: 2022, wnl History of abnormal pap smears: none  SCREENING STUDIES:  Last mammogram: 2024 Last colonoscopy: 2024, 5 year follow-up  MEDICATIONS:  Current Outpatient Medications:    5-Hydroxytryptophan (5-HTP) 100 MG CAPS, Take 100 mg by mouth daily. , Disp: , Rfl:    AMBULATORY NON FORMULARY MEDICATION, 3 (three) times daily. Medication Name: Amino acids double wood, Disp: , Rfl:    AMBULATORY NON FORMULARY MEDICATION, Traphala 250 mg Take 2 capsule by mouth daily, Disp: , Rfl:    ARMOUR THYROID 30 MG tablet, Take 1 tablet (30 mg total) by mouth daily., Disp: 90 tablet, Rfl: 3   Ascorbic Acid (VITAMIN C) POWD, Take 2,500 mg by mouth daily., Disp: , Rfl:    Cholecalciferol (VITAMIN D3) 10000 units capsule, Take  10,000 Units by mouth daily. , Disp: , Rfl:    Cyanocobalamin (VITAMIN B-12 ER PO), Take 5,000 mcg by mouth daily. , Disp: , Rfl:    Cyanocobalamin (VITAMIN B-12 IJ), Inject 5,000 mcg as directed once a week., Disp: , Rfl:    Digestive Enzymes (DIGESTIVE ENZYME PO), Take 2 capsules by mouth 2 (two) times daily before a meal., Disp: , Rfl:    estradiol (ESTRACE) 0.1 MG/GM vaginal cream, Place 1 Applicatorful vaginally 3 (three) times a week., Disp: , Rfl:    ESTRADIOL TD, 0.3125 mg daily., Disp: , Rfl:    MAGNESIUM CITRATE PO, Take 1,000 mg by mouth daily., Disp: , Rfl:    MAGNESIUM OXIDE PO, Take 290 mg by mouth daily., Disp: , Rfl:    Menaquinone-7 (VITAMIN K2) 100 MCG CAPS, Take 1 capsule by mouth daily., Disp: , Rfl:    Multiple Vitamin (MULTIVITAMIN) capsule, Take 1 capsule by mouth daily., Disp: , Rfl:    NON FORMULARY, Take 1,000 mcg by mouth daily. Methyl Folate chewable tablet, Disp: , Rfl:    NONFORMULARY OR COMPOUNDED ITEM, Exosome 1 trillion, I injection monthy, Disp: , Rfl:  NONFORMULARY OR COMPOUNDED ITEM, Take 0.3125 mg by mouth daily. Estradiol troche, Disp: , Rfl:    progesterone (PROMETRIUM) 100 MG capsule, Take 1.5 capsules by mouth daily., Disp: , Rfl:    TESTOSTERONE UNDECANOATE PO, Take 1 mg by mouth daily., Disp: , Rfl:    albuterol (VENTOLIN HFA) 108 (90 Base) MCG/ACT inhaler, Inhale 1 puff into the lungs daily as needed for wheezing or shortness of breath.  (Patient not taking: Reported on 09/25/2023), Disp: , Rfl:   ALLERGIES: Allergies  Allergen Reactions   Augmentin [Amoxicillin-Pot Clavulanate] Diarrhea   Clindamycin/Lincomycin Diarrhea   Iron     Other reaction(s): severe constipation   Moxifloxacin Other (See Comments)    neuropathy Other reaction(s): neuropathy   Sodium Citrate     Other Reaction(s): GI Upset (intolerance)    FAMILY HISTORY: Family History  Problem Relation Age of Onset   Angina Mother    Hypertension Mother    Lung cancer Mother 34        again at 79/79-smoker   Colon cancer Mother 20   Colon polyps Mother    Hyperlipidemia Father    Heart failure Father    Prostate cancer Father 66   Kidney failure Father    Hypertension Father    Colon polyps Father    Breast cancer Paternal Grandmother    Diabetes Paternal Grandmother    Lung cancer Paternal Grandfather    Esophageal cancer Neg Hx    Liver cancer Neg Hx    Pancreatic cancer Neg Hx    Rectal cancer Neg Hx    Stomach cancer Neg Hx    Ovarian cancer Neg Hx    Endometrial cancer Neg Hx     SOCIAL HISTORY: Social History   Socioeconomic History   Marital status: Divorced    Spouse name: Not on file   Number of children: 0   Years of education: Not on file   Highest education level: Not on file  Occupational History    Comment: Event organiser and life coach  Tobacco Use   Smoking status: Never   Smokeless tobacco: Never  Vaping Use   Vaping status: Never Used  Substance and Sexual Activity   Alcohol use: No    Alcohol/week: 0.0 standard drinks of alcohol   Drug use: No   Sexual activity: Not Currently    Birth control/protection: Post-menopausal  Other Topics Concern   Not on file  Social History Narrative   Patient is single and lives alone.   Patient is self-employed, career and life coaching.   Patient drinks two to four cups of caffeine daily.   Right handed    Social Drivers of Health   Financial Resource Strain: Not on file  Food Insecurity: No Food Insecurity (09/25/2023)   Hunger Vital Sign    Worried About Running Out of Food in the Last Year: Never true    Ran Out of Food in the Last Year: Never true  Transportation Needs: No Transportation Needs (09/25/2023)   PRAPARE - Administrator, Civil Service (Medical): No    Lack of Transportation (Non-Medical): No  Physical Activity: Not on file  Stress: Not on file  Social Connections: Not on file  Intimate Partner Violence: Not At Risk (09/25/2023)   Humiliation, Afraid, Rape,  and Kick questionnaire    Fear of Current or Ex-Partner: No    Emotionally Abused: No    Physically Abused: No    Sexually Abused: No    REVIEW OF  SYSTEMS: New patient intake form was reviewed.  Complete 10-system review is negative except for the following: constipation, abdominal distension  PHYSICAL EXAM: BP 101/66 (BP Location: Left Arm, Patient Position: Sitting)   Pulse 65   Temp 98 F (36.7 C) (Oral)   Resp 16   Ht 5\' 9"  (1.753 m)   Wt 134 lb (60.8 kg)   SpO2 100%   BMI 19.79 kg/m  Constitutional: No acute distress. Neuro/Psych: Alert, oriented.  Head and Neck: Normocephalic, atraumatic. Neck symmetric without masses. Sclera anicteric.  Respiratory: Normal work of breathing. Clear to auscultation bilaterally. Cardiovascular: Regular rate and rhythm, no murmurs, rubs, or gallops. Abdomen: Normoactive bowel sounds. Soft, non-distended, non-tender to palpation.  Extremities: Grossly normal range of motion. Warm, well perfused. No edema bilaterally. Skin: No rashes or lesions. Lymphatic: No cervical, supraclavicular, or inguinal adenopathy. Genitourinary: External genitalia without lesions. Urethral meatus without lesions or prolapse. On speculum exam, vagina and cervix without lesions. Bimanual exam reveals normal cervix and small mobile uterus, no adnexal masses. Rectovaginal exam confirms the above findings and reveals normal sphincter tone and no masses or nodularity. Exam chaperoned by Warner Mccreedy, NP   LABORATORY AND RADIOLOGIC DATA: Outside medical records were reviewed to synthesize the above history, along with the history and physical obtained during the visit.  Outside laboratory, pathology, and imaging reports were reviewed, with pertinent results below.  I personally reviewed the outside images.  WBC  Date Value Ref Range Status  09/11/2023 3.7 (L) 4.0 - 10.5 K/uL Final   Hemoglobin  Date Value Ref Range Status  09/11/2023 14.0 12.0 - 15.0 g/dL Final   08/65/7846 96.2 12.0 - 15.0 g/dL Final   HGB  Date Value Ref Range Status  03/15/2013 13.7 11.6 - 15.9 g/dL Final   HCT  Date Value Ref Range Status  09/11/2023 42.4 36.0 - 46.0 % Final  03/15/2013 40.8 34.8 - 46.6 % Final   Platelets  Date Value Ref Range Status  09/11/2023 183.0 150.0 - 400.0 K/uL Final  03/15/2013 210 145 - 400 10e3/uL Final   Platelet Count  Date Value Ref Range Status  04/22/2023 169 150 - 400 K/uL Final   LDH  Date Value Ref Range Status  05/24/2012 195 125 - 220 U/L Final   Magnesium  Date Value Ref Range Status  07/19/2008 2.1 1.5 - 2.5 mg/dL Final   Creatinine  Date Value Ref Range Status  04/22/2023 0.74 0.44 - 1.00 mg/dL Final  95/28/4132 0.7 0.6 - 1.1 mg/dL Final   Creat  Date Value Ref Range Status  11/04/2017 0.60 0.50 - 0.99 mg/dL Final    Comment:    For patients >45 years of age, the reference limit for Creatinine is approximately 13% higher for people identified as African-American. .    Creatinine, Ser  Date Value Ref Range Status  09/11/2023 0.63 0.40 - 1.20 mg/dL Final   AST  Date Value Ref Range Status  09/11/2023 23 0 - 37 U/L Final  04/22/2023 25 15 - 41 U/L Final  05/24/2012 29 5 - 34 U/L Final   ALT  Date Value Ref Range Status  09/11/2023 11 0 - 35 U/L Final  04/22/2023 13 0 - 44 U/L Final  05/24/2012 13 0 - 55 U/L Final   Cancer Antigen (CA) 125  Date Value Ref Range Status  09/28/2018 8.9 0.0 - 38.1 U/mL Final    Comment:    (NOTE) Roche Diagnostics Electrochemiluminescence Immunoassay (ECLIA) Values obtained with different assay methods  or kits cannot be used interchangeably.  Results cannot be interpreted as absolute evidence of the presence or absence of malignant disease. Performed At: [facility] [address] [place], [place] [id number] Jolene Schimke MD WU:[id number]    CEA [facility])  Date Value Ref Range Status  04/22/2023 1.98 0.00 - 5.00 ng/mL Final    Comment:    (NOTE) This  test was performed using Beckman Coulter's paramagnetic chemiluminescent immunoassay. Values obtained from different assay methods cannot be used interchangeably. Please note that up to 8% of patients who smoke may see values 5.1-10.0 ng/ml and 1% of patients who smoke may see CEA levels >10.0 ng/ml. Performed at [facility], [address], [place], [place] [address]   07/02/2022 1.90 0.00 - 5.00 ng/mL Final    Comment:    (NOTE) This test was performed using Beckman Coulter's paramagnetic chemiluminescent immunoassay. Values obtained from different assay methods cannot be used interchangeably. Please note that up to 8% of patients who smoke may see values 5.1-10.0 ng/ml and 1% of patients who smoke may see CEA levels >10.0 ng/ml. Performed at [facility], [address], [place], [place] [address]    10/11/2022-EMS 2 mm, 1.2 x 0.4 x 1.0 cm polypoid lesion in the endometrial cavity, normal ovaries 10/31/2022-EMS 1.8 mm 08/04/2023-EMS 3.1 mm, 0.8 x 0.5 x 0.6 cm polyp 08/28/2023-EMS 1.4 mm, normal ovaries  Surgical pathology (10/31/22): Endometrial biopsy - rare squamous epithelial cells, rare strips of endocervical epithelium, occasional portions of superficial endometrial tissue.  Endometrial glandular formations and stromal tissue not readily identified negative for atypia and malignancy.

## 2023-09-28 NOTE — Patient Instructions (Addendum)
It was a pleasure to see you in clinic today. - Normal exam today - I will send a note to your doctor from today's visit. I think a procedure to look inside the cervix and uterus and remove a polyp if visualized is a reasonable next step.  Thank you very much for allowing me to provide care for you today.  I appreciate your confidence in choosing our Gynecologic Oncology team at [facility].  If you have any questions about your visit today please call our office or send [place] a MyChart message and we will get back to you as soon as possible.   Recommendations for GYNs include: Dr. Ellison Hughs at [facility] associates Dr. Lorriane Shire at [facility] for [facility] at [phone]

## 2023-09-29 ENCOUNTER — Encounter: Payer: Self-pay | Admitting: Hematology

## 2023-09-29 ENCOUNTER — Encounter: Payer: Self-pay | Admitting: Psychiatry

## 2023-10-01 ENCOUNTER — Encounter: Payer: Self-pay | Admitting: Obstetrics and Gynecology

## 2023-10-01 NOTE — Telephone Encounter (Signed)
 Megan [name], pls contact patient and provide her with a SIBO breath test. THX.

## 2023-10-02 ENCOUNTER — Telehealth: Payer: Self-pay

## 2023-10-02 NOTE — Telephone Encounter (Signed)
 Received call from [facility] clarifying order after they received call from patient. Patient was given SIBO kit yesterday w/o lactulose which is also what is stated on the order form. They will call back with any further questions.

## 2023-10-02 NOTE — Telephone Encounter (Signed)
 Spoke with [facility] again & have refaxed updated SIBO form with clarification.

## 2023-10-06 ENCOUNTER — Encounter: Payer: Self-pay | Admitting: Psychiatry

## 2023-10-09 ENCOUNTER — Telehealth: Payer: Self-pay | Admitting: *Deleted

## 2023-10-09 NOTE — Telephone Encounter (Signed)
Spoke with Megan [name] in regards to scheduling an appt. With Dr. Tamela Oddi, per Dr. Mauro Kaufmann MyChart message to patient. Pt states she is in a meeting currently and will call the office back.

## 2023-10-09 NOTE — Telephone Encounter (Signed)
Pt returned a call from [facility].  Pt had questions regarding scheduling an appointment. Questions were answered.  Pt declines an appointment at this time with Dr.Jackson-Moore and requested an appointment with Dr.Newton.   Pt is scheduled on Monday 2/17 @ 2:15 with Dr.Newton. She agrees to date/time and was very appreciative for [place] working with her.

## 2023-10-09 NOTE — Telephone Encounter (Signed)
Attempted to reach patient to schedule an appt. With Dr. Tamela Oddi. Left voicemail requesting call back.

## 2023-10-12 ENCOUNTER — Inpatient Hospital Stay: Payer: Medicare Other

## 2023-10-12 ENCOUNTER — Encounter: Payer: Self-pay | Admitting: Psychiatry

## 2023-10-12 ENCOUNTER — Inpatient Hospital Stay (HOSPITAL_BASED_OUTPATIENT_CLINIC_OR_DEPARTMENT_OTHER): Payer: Medicare Other | Admitting: Psychiatry

## 2023-10-12 VITALS — BP 110/60 | HR 67 | Temp 97.7°F | Resp 18 | Wt 135.0 lb

## 2023-10-12 DIAGNOSIS — R300 Dysuria: Secondary | ICD-10-CM

## 2023-10-12 DIAGNOSIS — N840 Polyp of corpus uteri: Secondary | ICD-10-CM | POA: Diagnosis not present

## 2023-10-12 LAB — URINALYSIS, COMPLETE (UACMP) WITH MICROSCOPIC
Bacteria, UA: NONE SEEN
Bilirubin Urine: NEGATIVE
Glucose, UA: NEGATIVE mg/dL
Hgb urine dipstick: NEGATIVE
Ketones, ur: NEGATIVE mg/dL
Leukocytes,Ua: NEGATIVE
Nitrite: NEGATIVE
Protein, ur: NEGATIVE mg/dL
Specific Gravity, Urine: 1.006 (ref 1.005–1.030)
pH: 6 (ref 5.0–8.0)

## 2023-10-12 NOTE — Progress Notes (Signed)
Gynecologic Oncology Return Clinic Visit  Date of Service: 10/12/2023 Referring Provider: Olivia Mackie, MD   Assessment & Plan: Megan [name] is a 68 y.o. woman with ultrasound imaging showing a ~1cm endometrial versus endocervical polypoid mass.   Patient follow-up with her Ob/Gyn. He desires to proceed with repeat SIS first. Extensive discussion regarding her concerns. Encouraged patient to keep this SIS follow-up. Pending these results can discuss next steps as indicated.  Otherwise given pt concern for bladder fullness, discomfort, will send UA, urine culture to rule out UTI.   RTC pending the above.  Megan Cliff, MD Gynecologic Oncology   Medical Decision Making I personally spent  TOTAL 45 minutes face-to-face and non-face-to-face in the care of this patient, which includes all pre, intra, and post visit time on the date of service.   ----------------------- Reason for Visit: Follow-up   Interval History: Patient reports that she followed up with Dr. Billy Coast. He recommended repeat SIS.  Otherwise, patient notes some bladder fullness and discomfort without discrete burning with urination.  Concerned about a urinary tract infection.   Past Medical/Surgical History: Past Medical History:  Diagnosis Date   Allergic rhinitis    Allergy    Aneurysm of cardiac wall, congenital    a.  septal aneurysm extending into the RVOT (by cardiac MRI in 08/2013)   Anxiety    Aortic atherosclerosis (HCC)    Arthritis    Asthma    Basal cell carcinoma    Depression    Heart murmur    Hypothyroidism    on meds   Leukopenia    idiopathic   Lyme disease    Mitral valve prolapse    MVP (mitral valve prolapse)    Orthostatic hypotension    Osteoporosis    Pancreatic cyst    Peripheral neuropathy 06/09/2012   Pernicious anemia    Plantar fasciitis    Pneumothorax     Past Surgical History:  Procedure Laterality Date   COLONOSCOPY  2019   JMP-   COLONOSCOPY WITH  PROPOFOL N/A 02/22/2013   Procedure: COLONOSCOPY WITH PROPOFOL;  Surgeon: Charolett Bumpers, MD;  Location: [facility];  Service: Endoscopy;  Laterality: N/A;   MOUTH SURGERY  01/2012   bone graft in mouth   NASAL SINUS SURGERY Right 06/12/2014   Procedure: RIGHT ENDOSCOPIC MAXILLARY ANTROSTOMY;  Surgeon: Darletta Moll, MD;  Location: [facility];  Service: ENT;  Laterality: Right;   ROOT CANAL  02/27/2012   TONSILLECTOMY  1962   WISDOM TOOTH EXTRACTION      Family History  Problem Relation Age of Onset   Angina Mother    Hypertension Mother    Lung cancer Mother 14       again at 75/79-smoker   Colon cancer Mother 35   Colon polyps Mother    Hyperlipidemia Father    Heart failure Father    Prostate cancer Father 98   Kidney failure Father    Hypertension Father    Colon polyps Father    Breast cancer Paternal Grandmother    Diabetes Paternal Grandmother    Lung cancer Paternal Grandfather    Esophageal cancer Neg Hx    Liver cancer Neg Hx    Pancreatic cancer Neg Hx    Rectal cancer Neg Hx    Stomach cancer Neg Hx    Ovarian cancer Neg Hx    Endometrial cancer Neg Hx     Social History   Socioeconomic History   Marital  status: Divorced    Spouse name: Not on file   Number of children: 0   Years of education: Not on file   Highest education level: Not on file  Occupational History    Comment: Career and life coach  Tobacco Use   Smoking status: Never   Smokeless tobacco: Never  Vaping Use   Vaping status: Never Used  Substance and Sexual Activity   Alcohol use: No    Alcohol/week: 0.0 standard drinks of alcohol   Drug use: No   Sexual activity: Not Currently    Birth control/protection: Post-menopausal  Other Topics Concern   Not on file  Social History Narrative   Patient is single and lives alone.   Patient is self-employed, career and life coaching.   Patient drinks two to four cups of caffeine daily.   Right handed    Social Drivers of  Health   Financial Resource Strain: Not on file  Food Insecurity: No Food Insecurity (09/25/2023)   Hunger Vital Sign    Worried About Running Out of Food in the Last Year: Never true    Ran Out of Food in the Last Year: Never true  Transportation Needs: No Transportation Needs (09/25/2023)   PRAPARE - Administrator, Civil Service (Medical): No    Lack of Transportation (Non-Medical): No  Physical Activity: Not on file  Stress: Not on file  Social Connections: Not on file    Current Medications:  Current Outpatient Medications:    5-Hydroxytryptophan (5-HTP) 100 MG CAPS, Take 100 mg by mouth daily. , Disp: , Rfl:    albuterol (VENTOLIN HFA) 108 (90 Base) MCG/ACT inhaler, Inhale 1 puff into the lungs daily as needed for wheezing or shortness of breath.  (Patient not taking: Reported on 09/25/2023), Disp: , Rfl:    AMBULATORY NON FORMULARY MEDICATION, 3 (three) times daily. Medication Name: Amino acids double wood, Disp: , Rfl:    AMBULATORY NON FORMULARY MEDICATION, Traphala 250 mg Take 2 capsule by mouth daily, Disp: , Rfl:    ARMOUR THYROID 30 MG tablet, Take 1 tablet (30 mg total) by mouth daily., Disp: 90 tablet, Rfl: 3   Ascorbic Acid (VITAMIN C) POWD, Take 2,500 mg by mouth daily., Disp: , Rfl:    Cholecalciferol (VITAMIN D3) 10000 units capsule, Take 10,000 Units by mouth daily. , Disp: , Rfl:    Cyanocobalamin (VITAMIN B-12 ER PO), Take 5,000 mcg by mouth daily. , Disp: , Rfl:    Cyanocobalamin (VITAMIN B-12 IJ), Inject 5,000 mcg as directed once a week., Disp: , Rfl:    Digestive Enzymes (DIGESTIVE ENZYME PO), Take 2 capsules by mouth 2 (two) times daily before a meal., Disp: , Rfl:    estradiol (ESTRACE) 0.1 MG/GM vaginal cream, Place 1 Applicatorful vaginally 3 (three) times a week., Disp: , Rfl:    ESTRADIOL TD, 0.3125 mg daily., Disp: , Rfl:    MAGNESIUM CITRATE PO, Take 1,000 mg by mouth daily., Disp: , Rfl:    MAGNESIUM OXIDE PO, Take 290 mg by mouth daily.,  Disp: , Rfl:    Menaquinone-7 (VITAMIN K2) 100 MCG CAPS, Take 1 capsule by mouth daily., Disp: , Rfl:    Multiple Vitamin (MULTIVITAMIN) capsule, Take 1 capsule by mouth daily., Disp: , Rfl:    NON FORMULARY, Take 1,000 mcg by mouth daily. Methyl Folate chewable tablet, Disp: , Rfl:    NONFORMULARY OR COMPOUNDED ITEM, Exosome 1 trillion, I injection monthy, Disp: , Rfl:    NONFORMULARY  OR COMPOUNDED ITEM, Take 0.3125 mg by mouth daily. Estradiol troche, Disp: , Rfl:    progesterone (PROMETRIUM) 100 MG capsule, Take 1.5 capsules by mouth daily., Disp: , Rfl:    TESTOSTERONE UNDECANOATE PO, Take 1 mg by mouth daily., Disp: , Rfl:   Review of Symptoms: Complete 10-system review is positive for weight gain  Physical Exam: BP (!) 101/54 (BP Location: Left Arm, Patient Position: Sitting) Comment: Notified RN  Pulse 67   Temp 97.7 F (36.5 C) (Oral)   Resp 18   Wt 135 lb (61.2 kg)   SpO2 100%   BMI 19.94 kg/m  General: Alert, oriented, no acute distress. HEENT: Normocephalic, atraumatic. Neck symmetric without masses. Sclera anicteric.  Chest: Normal work of breathing.  Cardiovascular: Regular rate  Extremities: Grossly normal range of motion.  Warm, well perfused.  No edema bilaterally.   Laboratory & Radiologic Studies: none

## 2023-10-12 NOTE — Patient Instructions (Signed)
It was a pleasure to see you in clinic today. - Will let you know if the urine shows any signs of infection - Continue with sonohysterogram as scheduled  Thank you very much for allowing me to provide care for you today.  I appreciate your confidence in choosing our Gynecologic Oncology team at [facility].  If you have any questions about your visit today please call our office or send [place] a MyChart message and we will get back to you as soon as possible.

## 2023-10-13 LAB — URINE CULTURE: Culture: <10000 — AB

## 2023-10-16 ENCOUNTER — Encounter: Payer: Self-pay | Admitting: Internal Medicine

## 2023-10-16 ENCOUNTER — Telehealth: Payer: Self-pay | Admitting: Nurse Practitioner

## 2023-10-16 NOTE — Telephone Encounter (Signed)
I do not have these results in my inbox.  Marchelle Folks could you please investigate and perhaps call the company for results JMP

## 2023-10-16 NOTE — Telephone Encounter (Signed)
Patient called and stated that she was inquiring about her results from the SIBO testing down. Patient stated that she would like to request a call back within the hour. Please advise.

## 2023-10-19 ENCOUNTER — Other Ambulatory Visit: Payer: Self-pay | Admitting: Sports Medicine

## 2023-10-19 NOTE — Telephone Encounter (Signed)
 Pt stated that she is requesting the results from her recent SIBO test. Pt stated that she does have an appointment to see Dr. Rhea Belton tomorrow although she is still questioning if she could possible find out the results today. Please review and advise.

## 2023-10-19 NOTE — Telephone Encounter (Signed)
 SIBO test was positive We can discuss treatment tomorrow at her office visit JMP

## 2023-10-19 NOTE — Telephone Encounter (Signed)
 Spoke with pt.  Documented in phone note: Pt verbalized understanding with all questions answered.

## 2023-10-19 NOTE — Telephone Encounter (Signed)
 Pt made aware. Pt verbalized understanding with all questions answered.

## 2023-10-19 NOTE — Telephone Encounter (Signed)
 Left message for pt to call back

## 2023-10-20 ENCOUNTER — Ambulatory Visit: Payer: Medicare Other | Admitting: Internal Medicine

## 2023-10-20 ENCOUNTER — Encounter: Payer: Self-pay | Admitting: Internal Medicine

## 2023-10-20 ENCOUNTER — Other Ambulatory Visit (INDEPENDENT_AMBULATORY_CARE_PROVIDER_SITE_OTHER): Payer: Medicare Other

## 2023-10-20 VITALS — BP 96/66 | HR 72 | Ht 69.0 in | Wt 133.2 lb

## 2023-10-20 DIAGNOSIS — K638219 Small intestinal bacterial overgrowth, unspecified: Secondary | ICD-10-CM | POA: Diagnosis not present

## 2023-10-20 DIAGNOSIS — D510 Vitamin B12 deficiency anemia due to intrinsic factor deficiency: Secondary | ICD-10-CM

## 2023-10-20 DIAGNOSIS — K5909 Other constipation: Secondary | ICD-10-CM

## 2023-10-20 DIAGNOSIS — R5383 Other fatigue: Secondary | ICD-10-CM | POA: Diagnosis not present

## 2023-10-20 DIAGNOSIS — E538 Deficiency of other specified B group vitamins: Secondary | ICD-10-CM

## 2023-10-20 DIAGNOSIS — Z800 Family history of malignant neoplasm of digestive organs: Secondary | ICD-10-CM

## 2023-10-20 LAB — IBC + FERRITIN
Ferritin: 65.4 ng/mL (ref 10.0–291.0)
Iron: 94 ug/dL (ref 42–145)
Saturation Ratios: 26.5 % (ref 20.0–50.0)
TIBC: 354.2 ug/dL (ref 250.0–450.0)
Transferrin: 253 mg/dL (ref 212.0–360.0)

## 2023-10-20 LAB — PROTIME-INR
INR: 1.1 {ratio} — ABNORMAL HIGH (ref 0.8–1.0)
Prothrombin Time: 11.6 s (ref 9.6–13.1)

## 2023-10-20 LAB — VITAMIN B12: Vitamin B-12: >1537 pg/mL — ABNORMAL HIGH (ref 211–911)

## 2023-10-20 LAB — VITAMIN D 25 HYDROXY (VIT D DEFICIENCY, FRACTURES): VITD: 56.65 ng/mL (ref 30.00–100.00)

## 2023-10-20 NOTE — Patient Instructions (Signed)
 Your provider has requested that you go to the basement level for lab work before leaving today. Press "B" on the elevator. The lab is located at the first door on the left as you exit the elevator.  _______________________________________________________  If your blood pressure at your visit was 140/90 or greater, please contact your primary care physician to follow up on this.  _______________________________________________________  If you are age 69 or older, your body mass index should be between 23-30. Your Body mass index is 19.68 kg/m. If this is out of the aforementioned range listed, please consider follow up with your Primary Care Provider.  If you are age 17 or younger, your body mass index should be between 19-25. Your Body mass index is 19.68 kg/m. If this is out of the aformentioned range listed, please consider follow up with your Primary Care Provider.   ________________________________________________________  The [facility] providers would like to encourage you to use [facility] to communicate with providers for non-urgent requests or questions.  Due to long hold times on the telephone, sending your provider a message by [facility] may be a faster and more efficient way to get a response.  Please allow 48 business hours for a response.  Please remember that this is for non-urgent requests.  _______________________________________________________

## 2023-10-21 ENCOUNTER — Encounter: Payer: Self-pay | Admitting: Internal Medicine

## 2023-10-21 NOTE — Progress Notes (Signed)
 Subjective:    Patient ID: Megan [name], female    DOB: 1955-11-30, 67 y.o.   MRN: [id number]  HPI Megan [name] "Megan [name]" is a 68 year old female with chronic past patient, pernicious anemia and B12 deficiency who presents with gastrointestinal symptoms and constipation.  She has a history of pernicious anemia and B12 deficiency. An EGD on June 02, 2023, revealed a 2 cm hiatal hernia and a Makar grade four gastroesophageal flap, with normal mucosa and no evidence of H. pylori, intestinal metaplasia, dysplasia, or malignancy. Multiple biopsies from the stomach were normal, and the duodenum was also normal. A tongue brushing was negative for yeast.  A colonoscopy on June 02, 2023, was performed due to a family history of colorectal cancer in a first-degree relative. The colonoscopy was normal, though the left colon was moderately tortuous, and a five-year surveillance colonoscopy was recommended. A CT abdomen and pelvis without contrast on September 12, 2023, for persistent right lower quadrant abdominal pain showed no acute abnormalities but noted a large colonic stool burden.  She experiences chronic constipation, which she attributes to various factors, including bioidentical hormone therapy. A significant increase in estradiol and testosterone levels after a pharmacy switch led to symptoms such as night sweats, breast tenderness, headaches, and greasy skin. These symptoms resolved after adjusting her hormone dose, but abdominal distention and discomfort persisted. In October 2024, she experienced a complete cessation of bowel movements, initially attributed to increased hormone doses and later to a possible post-colonoscopy issue. She uses Triphala and cod liver oil to manage constipation, initially resulting in regular bowel movements but later causing abdominal pain and headaches. She discontinued triphala in February 2025 and switched to magnesium citrate and oxide, which she has been using  for two days.  She consumes kimchi daily and craves sugar. She is considering dietary changes, such as a low FODMAP diet, to address her symptoms. No significant gas is reported, but she experiences abdominal distention and discomfort, suspected to be related to a partial obstruction that has since resolved.  She describes significant fatigue impacting her ability to exercise and wonders if her ferritin and B12 levels contribute to her low energy. She has a history of osteoporosis and is trying to avoid medication by strength training, but fatigue limits her ability to exercise.   Review of Systems As per HPI, otherwise negative  Current Medications, Allergies, Past Medical History, Past Surgical History, Family History and Social History were reviewed in [name] record.    Objective:   Physical Exam BP 96/66   Pulse 72   Ht 5\' 9"  (1.753 m)   Wt 133 lb 4 oz (60.4 kg)   BMI 19.68 kg/m  Gen: awake, alert, NAD HEENT: anicteric  Neuro: nonfocal  LABS Hydrogen and methane breath test: Hydrogen and methane combined 16 (threshold 15) TSH: 4 Albumin: 4.5  RADIOLOGY CT abdomen pelvis: No acute abnormality in the abdomen or pelvis, large colonic stool (09/12/2023)  DIAGNOSTIC EGD: 2 cm hiatal hernia, Calmes grade IV gastroesophageal flap, normal mucosa, normal duodenum, negative tongue brushing for yeast (06/02/2023) Colonoscopy: Normal colonoscopy, moderately tortuous left colon (06/02/2023)  PATHOLOGY Gastric biopsies: Normal gastric cardiac, fundus, body, and antral biopsies; no H. pylori, intestinal metaplasia, dysplasia, or malignancy (06/02/2023)      Assessment & Plan:  68 year old female with a history of chronic constipation/colonic inertia, family history of colon cancer in her mother, family history of colon cancer in her father, pernicious anemia, hypothyroidism, small pancreatic  cyst, osteoporosis, burning tongue syndrome who is here for  follow-up.   Chronic Constipation Patient reports improvement with triphala and cod liver oil, but now experiencing more complete bowel movements.  Discussed the potential impact of gut microbiome and the possibility of trying Motegrity if necessary. -Continue current regimen of magnesium citrate (1000mg ) and magnesium oxide (290mg ).  High-fiber diet -Consider Motegrity if constipation persists.  Pernicious Anemia and B12 Deficiency Recent EGD showed normal mucosa and no evidence of atrophic gastritis. No H Pylori, intestinal metaplasia, dysplasia, or malignancy found on biopsy. -Continue monitoring. -Continue B12 supplementation, B12 levels have been normal  Family History of Colorectal Cancer Recent colonoscopy was normal, but noted a moderately tortuous left colon. -Recommend 5-year surveillance colonoscopy.  Right Lower Quadrant Abdominal Pain Recent CT abdomen/pelvis showed no acute abnormality. Pain may be related to constipation. -Continue current regimen for constipation and monitor for changes.  Hormonal Changes Patient reports symptoms suggestive of hormonal changes, including night sweats and breast pain. Recent increase in estradiol and testosterone levels noted. -She will continue with her functional medicine doctor  Fatigue Patient reports significant fatigue, impacting ability to exercise. Discussed potential causes including thyroid function, B12, vitamin D, and ferritin levels. -Order labs for B12, ferritin, INR, vitamin D3, and zinc.  Small Intestinal Bacterial Overgrowth (SIBO) Patient has borderline overgrowth. Discussed potential impact on absorption and energy levels. -No treatment for SIBO at this time, allow time for gut to rebalance.  Pelvic Ultrasound Findings Patient reports multiple ultrasounds showing a uterine abnormality in the pelvis.  -Additional testing planned for tomorrow   45 minutes total spent today including patient facing time,  coordination of care, reviewing medical history/procedures/pertinent radiology studies, and documentation of the encounter.

## 2023-10-22 ENCOUNTER — Encounter: Payer: Self-pay | Admitting: Internal Medicine

## 2023-10-23 ENCOUNTER — Encounter: Payer: Self-pay | Admitting: Hematology

## 2023-10-23 ENCOUNTER — Encounter: Payer: Self-pay | Admitting: Psychiatry

## 2023-10-23 LAB — ZINC: Zinc: 58 ug/dL — ABNORMAL LOW (ref 60–130)

## 2023-10-25 ENCOUNTER — Encounter: Payer: Self-pay | Admitting: Internal Medicine

## 2023-10-26 ENCOUNTER — Ambulatory Visit: Payer: Medicare Other | Admitting: Podiatry

## 2023-11-10 ENCOUNTER — Other Ambulatory Visit: Payer: Self-pay | Admitting: Sports Medicine

## 2023-11-11 ENCOUNTER — Telehealth: Payer: Self-pay | Admitting: *Deleted

## 2023-11-11 ENCOUNTER — Other Ambulatory Visit: Payer: Self-pay | Admitting: Sports Medicine

## 2023-11-11 DIAGNOSIS — M25571 Pain in right ankle and joints of right foot: Secondary | ICD-10-CM

## 2023-11-11 DIAGNOSIS — M25572 Pain in left ankle and joints of left foot: Secondary | ICD-10-CM

## 2023-11-11 NOTE — Telephone Encounter (Signed)
 Patient called the office and left a message. Pt states she spoke with Dr.Taavon in regards to her cystogram and was advised to call and speak with Dr. Alvester Morin. Pt is requesting a phone visit if possible. Message relayed to providers.

## 2023-11-11 NOTE — Telephone Encounter (Signed)
 Spoke with Ms. Neyman and relayed message from Warner Mccreedy, NP that Dr. Alvester Morin is not back in the office until Monday, but we will send her the message in regards to setting up a phone visit and reach back out as soon as we here back from provider. Pt verbalized understanding and thanked the office.  Pt states she had a sonohysterogram with Dr. Billy Coast and the results haven't been discussed with her and Dr. Jorene Minors office advised pt to call Dr. Alvester Morin.

## 2023-11-12 ENCOUNTER — Telehealth: Payer: Self-pay | Admitting: Cardiology

## 2023-11-12 NOTE — Telephone Encounter (Signed)
 Called pt, she states she was not trying to make a urgent appointment. She wanted to make her appt with C. Walker, appt made. She also wanted to be seen sooner the July. Appt made with a NP sooner to have pt evaluated. She does not complain of any urgent matters.

## 2023-11-12 NOTE — Telephone Encounter (Signed)
  Per MyChart scheduling message:  1. Are you having CP right now?    2. Are you experiencing any other symptoms (ex. SOB, nausea, vomiting, sweating)?   3. Is your CP continuous or coming and going?   4. Have you taken Nitroglycerin?   5. How long have you been experiencing CP?    6. If NO CP at time of call then end call with telling Pt to call back or call 911 if Chest pain returns prior to return call from triage team.   Pt c/o swelling/edema: STAT if pt has developed SOB within 24 hours  If swelling, where is the swelling located?   How much weight have you gained and in what time span?   Have you gained 2 pounds in a day or 5 pounds in a week?   Do you have a log of your daily weights (if so, list)?   Are you currently taking a fluid pill?   Are you currently SOB?   Have you traveled recently in a car or plane for an extended period of time?   1).  No CP at the moment.  It comes/goes but has been more frequent lately.  It is not like someone sitting on my chest, but short (1-2 minute) episodes, of a relatively sharp pain under my left breast.   I have been feeling unwell for several months, very fatigued and weak, but all of my labs have come back normal.  Sometimes I have to sit down after a shower, which is very unlike me.  I typically workout three days a week but have not done that for two months.  2).  I have been having night sweats for a month or so. I am post-menopausal 3).  CP has been worse the last week or two 4).  It comes and goes. 5).  Not taken anything.   Feet: 1).  My right foot has been swollen for a while and my doctor says he can feel good pulses.  Yesterday, my left foot got pretty swollen and it was painful to walk.  Today it is better, after having a very large and loose stool.  I am feeling unwell, possibly due to side effects from three prescription eye drops I am on.  Headache, stomach upset, joint pain and swelling in feet, feeling hot but no  fever.  I have not been around anyone who is sick.  The eye drops are Fluorometholone, Exemvy, Vevye.  I have a call into the eye doctor to ask but have not heard back.  2).  No weight gain. 3).  Same. 4). 130-134. 5).  No fluid pills. 6).  I have been feeling weak and very fatigued for several months.  7).  No

## 2023-11-13 ENCOUNTER — Encounter: Payer: Self-pay | Admitting: Internal Medicine

## 2023-11-13 ENCOUNTER — Encounter: Payer: Self-pay | Admitting: Hematology

## 2023-11-13 ENCOUNTER — Ambulatory Visit: Payer: Medicare Other | Admitting: Internal Medicine

## 2023-11-13 VITALS — BP 112/64 | HR 70 | Ht 69.0 in | Wt 132.8 lb

## 2023-11-13 DIAGNOSIS — E162 Hypoglycemia, unspecified: Secondary | ICD-10-CM | POA: Diagnosis not present

## 2023-11-13 DIAGNOSIS — M810 Age-related osteoporosis without current pathological fracture: Secondary | ICD-10-CM | POA: Diagnosis not present

## 2023-11-13 DIAGNOSIS — E039 Hypothyroidism, unspecified: Secondary | ICD-10-CM

## 2023-11-13 LAB — POCT GLYCOSYLATED HEMOGLOBIN (HGB A1C): Hemoglobin A1C: 5 % (ref 4.0–5.6)

## 2023-11-13 NOTE — Patient Instructions (Signed)
Please continue Armour 30 mg daily.  Take the thyroid hormone every day, with water, at least 30 minutes before breakfast, separated by at least 4 hours from: - acid reflux medications - calcium - iron - multivitamins  Please stop at the lab.  Please come back for a follow-up appointment in 1 year.

## 2023-11-13 NOTE — Progress Notes (Addendum)
 Patient ID: Megan [name], female   DOB: [birth year], 68 y.o.   MRN: [id number]   HPI  Megan [name] is a 68 y.o.-year-old female, initially referred by her OB/GYN doctor, Dr. Billy Coast, returning for follow-up for osteoporosis and hypothyroidism.  Last visit 1 year ago.   Interim history: No falls or fractures since last visit.  She continues to exercise consistently and also uses a vibration platform.  She is on compounded HRT - now changed to obtaining them through [facility].  After she changed the compounding pharmacy >> she had bloating and constipation >> found to have SIBO.  She saw Dr. Rhea Belton.  ABx were not recommended at that time.  She is feeling a little better now. She has night sweats recently, also uninflamed submental lymph node, and increased fatigue.  She was recently tested for flu and COVID and these were negative.  Perceived hypoglycemia  Reviewed history: At the end of last 2021, she described that she had dental surgery for a longstanding infection and could only eat soft foods for a period of 3 weeks.  She also cut down carbs after she was found to have a high cholesterol level.  She started to feel poorly with dizziness and flushing and finally presented to the emergency room 07/03/2020.  After being given glucose, sugars improved.  At home, she continued to monitor her blood sugars consistently and she did not have blood sugars lower than 62.Reviewing her detailed diet and blood sugar records, they ranged between 62 (although was, before lunch) to 117.  I reassured her that this was not abnormal.  We discussed about improving diet at our visit from 08/2020.  I made specific suggestions to avoid postprandial hypoglycemia.  I recommended against a CGM.  Since last visit, she saw a specialist at [facility] and had a CGM placed.  Sugars were all at goal so she is came off the CGM.  Also, since last visit she had a normal cosyntropin stimulation test ordered by the provider at  [facility] (02/05/2021): Time 0: ACTH 18.3, cortisol 9.3 Time 60 minutes: Cortisol 23.7  She was diagnosed with osteoporosis in 2006, but she had lower BMD even before menopause in 2004.  She was following-up with a bone specialist in [place] (Dr. Jacquenette Shone) but wanted to continue to follow-up with me for this problem.  She tells me that Dr. Jacquenette Shone has retired and she will see his colleague soon.  Reviewed previous DXA scans: Date L1-L4 T score FN T score 33% distal Radius  10/17/2022 [facility]) -3.4 RFN: -2.3 LFN: -2.3 -4.2  03/31/2022 [facility]) -2.8 RFN: -2.5 LFN: -2.4 -4.4  11/25/2021 [facility]) -3.0 RFN: ? LFN: -2.7 ?  10/01/2021 [facility]) -2.7 ? -4.4  09/18/2021 [facility]) -2.8 RFN: ? LFN: -2.2 ?  03/01/2021 [facility], [place]) -3.9 (-6.3%*) RFN: -2.8 LFN: -2.4 -4.5  11/09/2019 [facility], [place]) -3.5 (-0.4%) RFN: -2.4  LFN: -2.4 n/a  11/03/2018 [facility], [place]) -3.5 (-2.8%) RFN: -2.3 LFN: -2.1  n/a  11/10/2017 [facility], [place]) -2.3 RFN: n/a LFN: n/a n/a  08/13/2017 -3.3 (-4.4%*) RFN: -2.0 LFN: -1.8 n/a  08/10/2015 -3.0 RFN: -2.0 LFN: -2.2 n/a   Other reports reviewed Per records brought by patient: L1-L4: 06/2013: -3.5 10/2007: -3.1 07/2005: -2.5 05/2003: -2.2  RFN: 06/2013: -2.2 10/2007: -2.0 07/2005: -2.1 05/2003: -1.8  LFN: 06/2013: -2.2 10/2007: -2.1 07/2005: -2.2 05/2003: -1.9  She had 1 fracture: - 07/2016: Right rib  03/2022: CTX 276 03/19/2022: NTX 67  No recent falls or fractures.  Reviewed previous osteoporosis therapy: - Fosamax and Actonel - 2001-2004 (no help) - Estradiol 0.0125 mg + Progesterone - 2014-2016 (improvement in spine BMD) >> she  just started a combination of Estradiol-Prometrium-Testosterone 0.06-5-0.33 mg/gtt  She previously refused other medications for osteoporosis. - In 02/2021, she saw Dr. Jacquenette Shone and he recommended again Evenity.  She started Evenity 02/2021 >> 07/2021 (b/c signif. Mm and joint aches), then 3 months off, then 2 half  doses. Plan was to continue half dose until 11/2022. She stopped Evenity - 03/2022 around the time of her 1st COVID episode.  She mentions she had a low blood count after every Evenity injection.  She was very sick at that time and did not pursue other osteoporosis treatments so she ended up stopping Evenity abruptly.   - she tried Tymlos >> severe back pain - now on exosome injections for osteoporosis- had 4 injections (800$ pr injection)  No history of vitamin D deficiency.  Reviewed previous vitamin D levels: Lab Results  Component Value Date   VD25OH 56.65 10/20/2023   VD25OH 63.8 02/29/2020  08/26/2022: Vitamin D 60.1 07/26//2023: vitamin D 56.2 07/14/2019: Vitamin D 57 01/27/2019: Vitamin D 50.7-on 5000 units vitamin D daily 03/29/2018: Vitamin D 82.5 09/2017: Vitamin D 72 06/17/2016: Vitamin D 35  She takes vitamin K2 +5000 units vitamin D daily.  She continues to exercise consistently.  Working with a Systems analyst and introducing new exercises every 3 weeks.  Also using a vibration platform 10 minutes twice a day.  She tried [place] >> got hurt - also had a HA that lasted 3 weeks.  She retried this earlier in 2021 and she again hurt herself and developed a headache.  No history of repeated steroid courses.  In 2018, she ruled out for multiple myeloma by protein electrophoresis.  She has polyclonal gammopathy and follows with hematology.  She was also investigated by nephrology for proteinuria.  Menopause was at 68 years old.  Pt does have a FH of osteoporosis: M - had spinal fx's, father - Lupron.  She was seeing Dr. Iona Hansen >> then switched to another functional medicine doctor. She was told she had mold in her house, low WBC, tested positive for heavy metals, iron is low (she had a recent low ferritin level, also - 01/2019).    No history of kidney stones, hyper or hypocalcemia or hyperparathyroidism: 09/21/2023: Calcium 9.0 (8.7-10.3) Lab Results  Component  Value Date   CALCIUM 9.1 09/11/2023   CALCIUM 9.7 06/08/2023   CALCIUM 9.3 05/04/2023   CALCIUM 9.5 04/22/2023   CALCIUM 9.7 11/17/2022   CALCIUM 9.2 07/02/2022   CALCIUM 9.5 04/21/2022   CALCIUM 9.7 04/11/2022   CALCIUM 9.4 07/29/2021   CALCIUM 9.0 04/22/2021  03/29/2018: PTH 48  No history of CKD.  Latest BUN/creatinine: 09/21/2023: 11/0.66, GFR 95, glucose 71 Lab Results  Component Value Date   BUN 13 09/11/2023   CREATININE 0.63 09/11/2023   Hypothyroidism.  She was initially on Nature-Throid, then she had to switch to Armour due to [employer].  She noticed some hair loss after switching, but no other symptoms.  In 2019, she was taken off the medication by one of her providers but she felt terrible and restarted.  Reviewed her TFTs: 10/16/2023: TSH 3.05 09/21/2023: TSH 4.04 08/26/2022: TSH 2.01 03/19/2022: TSH 1.59 Lab Results  Component Value Date   TSH 2.60 03/06/2021  01/27/2019: TSH: 2.21 Lab Results  Component Value Date   TSH 2.78 06/30/2018  03/29/2018: TSH  3.17, free T4 0.75, free T3 3.0 02/10/2018: TSH 1.89, free T4 0.9, free T3 2.2 11/04/2017: TSH 2.74, free T4 0.59, free T3 5.2 08/14/2017: TSH 1.780, fT4 1.08 - pt was on Biotin 10,000 mcg when labs drawn; Selenium and Urinary iodine normal 07/19/2008: TSH 6.76  On Armour 30 mg daily, increased 09/2021, by her Integrative Med Provider >> may stop seeing [facility] in the near future and will come here for refills. - in am - fasting - at least 30 min from b'fast - no calcium - no iron - no multivitamins - no PPIs - not on Biotin  She is on multiple other supplements, including alpha-lipoic acid, co-Q10, 5 HTP, vitamin B12, probiotics, magnesium, etc. We stopped her vitamin A since this was found to exacerbate osteoporosis. She stopped the supplement and also her multivitamins due to elevated B6 vitamin level.  On 02/10/2018, her vitamin B6 was 10.9 (2.0-32.8), normal. In 2019, she  went to the emergency  room for chest pain after she started a natural supplement (nitric balance-ATP), which she stopped since.  Elevated estradiol:  Reviewed history: She had previously undetectable estradiol levels in 07/25/2013, 08/21/2014, 05/05/2016, 06/03/2016, however, in 04/25/2017, her level was 44.3 pg/mL.  At that time, she was giving estriol to her dog without gloves.  She started to use gloves and her level became undetectable again.  She then started to reuse the same gloves and in 03/23/2018, the level was 197.8 pg/mL.  She started to use gloves only once for administration and her level decreased to 101.8 on 03/29/2018.  She was very worried about this despite reassurance that the pattern of estrogen increase was consistent with interference with the assay rather than a tumoral source. She had imaging tests that initially showed an ovarian cyst which resolved. The elevated estrogen was deemed to be due to interference with the assay from her high-dose biotin. She now has an endometrial polyp which has been evaluated by Dr. Billy Coast and Dr. Alvester Morin with conflicting investigation plans.  She may ask for a third opinion from Dr. Adalberto Ill.  Other medical conditions: She has a history of pernicious anemia and IDA.  She got iron infusions. She has a stable 8 mm pancreatic cyst-no intervention needed, only repeat MRI for follow-up. She continues to have chronic fatigue.  She saw infectious disease. She has a history of avascular necrosis in her right foot. She stopped seeing the [facility] when they stopped offering virtual visits - now only [facility].  She was recently found to have a high concentration of mold in blood.  She was recommended an antifungal for 6 months.  She went to see ID who recommended against it but at the end agreed that she could do a 1 to 2 month course.  She did this and she started to feel poorly.  Did not recover completely yet. She had Covid19 2x: in  03/2022 and 05/2022. In 2024, she developed breast pressure and pain from her hormonal supplements given by the integrative medicine provider.  Apparently, these are titrated up for osteoporosis treatment.  She did not feel good about this plan so she recently reduced the doses to 50% by herself.  She felt much better afterwards. ROS: + See HPI  Past Medical History:  Diagnosis Date   Allergic rhinitis    Allergy    Aneurysm of cardiac wall, congenital    a.  septal aneurysm extending into the RVOT (by cardiac MRI in 08/2013)   Anxiety  Aortic atherosclerosis (HCC)    Arthritis    Asthma    Basal cell carcinoma    Depression    Heart murmur    Hypothyroidism    on meds   Leukopenia    idiopathic   Lyme disease    Mitral valve prolapse    MVP (mitral valve prolapse)    Orthostatic hypotension    Osteoporosis    Pancreatic cyst    Peripheral neuropathy 06/09/2012   Pernicious anemia    Plantar fasciitis    Pneumothorax    Past Surgical History:  Procedure Laterality Date   COLONOSCOPY  2019   JMP-   COLONOSCOPY WITH PROPOFOL N/A 02/22/2013   Procedure: COLONOSCOPY WITH PROPOFOL;  Surgeon: Charolett Bumpers, MD;  Location: [facility];  Service: Endoscopy;  Laterality: N/A;   MOUTH SURGERY  01/2012   bone graft in mouth   NASAL SINUS SURGERY Right 06/12/2014   Procedure: RIGHT ENDOSCOPIC MAXILLARY ANTROSTOMY;  Surgeon: Darletta Moll, MD;  Location: [facility];  Service: ENT;  Laterality: Right;   ROOT CANAL  02/27/2012   TONSILLECTOMY  1962   WISDOM TOOTH EXTRACTION     Social History   Socioeconomic History   Marital status: Divorced    Spouse name: Not on file   Number of children: 0   Years of education: Not on file   Highest education level: Not on file  Occupational History    Comment: Career and life coach  Tobacco Use   Smoking status: Never   Smokeless tobacco: Never  Vaping Use   Vaping status: Never Used  Substance and Sexual Activity    Alcohol use: No    Alcohol/week: 0.0 standard drinks of alcohol   Drug use: No   Sexual activity: Not Currently    Birth control/protection: Post-menopausal  Other Topics Concern   Not on file  Social History Narrative   Patient is single and lives alone.   Patient is self-employed, career and life coaching.   Patient drinks two to four cups of caffeine daily.   Right handed    Social Drivers of Health   Financial Resource Strain: Not on file  Food Insecurity: No Food Insecurity (09/25/2023)   Hunger Vital Sign    Worried About Running Out of Food in the Last Year: Never true    Ran Out of Food in the Last Year: Never true  Transportation Needs: No Transportation Needs (09/25/2023)   PRAPARE - Administrator, Civil Service (Medical): No    Lack of Transportation (Non-Medical): No  Physical Activity: Not on file  Stress: Not on file  Social Connections: Not on file  Intimate Partner Violence: Not At Risk (09/25/2023)   Humiliation, Afraid, Rape, and Kick questionnaire    Fear of Current or Ex-Partner: No    Emotionally Abused: No    Physically Abused: No    Sexually Abused: No   Current Outpatient Medications on File Prior to Visit  Medication Sig Dispense Refill   5-Hydroxytryptophan (5-HTP) 100 MG CAPS Take 100 mg by mouth daily.      albuterol (VENTOLIN HFA) 108 (90 Base) MCG/ACT inhaler Inhale 1 puff into the lungs daily as needed for wheezing or shortness of breath.     AMBULATORY NON FORMULARY MEDICATION 3 (three) times daily. Medication Name: Amino acids double wood     ARMOUR THYROID 30 MG tablet Take 1 tablet (30 mg total) by mouth daily. 90 tablet 3   Ascorbic  Acid (VITAMIN C) POWD Take 2,500 mg by mouth daily.     Cholecalciferol (VITAMIN D3) 10000 units capsule Take 10,000 Units by mouth daily.      Cyanocobalamin (VITAMIN B-12 ER PO) Take 5,000 mcg by mouth daily.      Cyanocobalamin (VITAMIN B-12 IJ) Inject 5,000 mcg as directed once a week.      estradiol (ESTRACE) 0.1 MG/GM vaginal cream Place 1 Applicatorful vaginally 3 (three) times a week.     ESTRADIOL TD 0.3125 mg daily.     MAGNESIUM CITRATE PO Take 1,000 mg by mouth daily.     MAGNESIUM OXIDE PO Take 290 mg by mouth daily.     Menaquinone-7 (VITAMIN K2) 100 MCG CAPS Take 1 capsule by mouth daily.     Multiple Vitamin (MULTIVITAMIN) capsule Take 1 capsule by mouth daily.     NON FORMULARY Take 1,000 mcg by mouth daily. Methyl Folate chewable tablet     NONFORMULARY OR COMPOUNDED ITEM Exosome 1 trillion, I injection monthy     NONFORMULARY OR COMPOUNDED ITEM Take 0.3125 mg by mouth daily. Estradiol troche     progesterone (PROMETRIUM) 100 MG capsule Take 1.5 capsules by mouth daily.     TESTOSTERONE UNDECANOATE PO Take 1 mg by mouth daily.     No current facility-administered medications on file prior to visit.   Allergies  Allergen Reactions   Augmentin [Amoxicillin-Pot Clavulanate] Diarrhea   Clindamycin/Lincomycin Diarrhea   Iron     Other reaction(s): severe constipation   Moxifloxacin Other (See Comments)    neuropathy Other reaction(s): neuropathy   Sodium Citrate     Other Reaction(s): GI Upset (intolerance)   Family History  Problem Relation Age of Onset   Angina Mother    Hypertension Mother    Lung cancer Mother 18       again at 75/79-smoker   Colon cancer Mother 53   Colon polyps Mother    Hyperlipidemia Father    Heart failure Father    Prostate cancer Father 70   Kidney failure Father    Hypertension Father    Colon polyps Father    Breast cancer Paternal Grandmother    Diabetes Paternal Grandmother    Lung cancer Paternal Grandfather    Esophageal cancer Neg Hx    Liver cancer Neg Hx    Pancreatic cancer Neg Hx    Rectal cancer Neg Hx    Stomach cancer Neg Hx    Ovarian cancer Neg Hx    Endometrial cancer Neg Hx    PE: BP 112/64   Pulse 70   Ht 5\' 9"  (1.753 m)   Wt 132 lb 12.8 oz (60.2 kg)   SpO2 98%   BMI 19.61 kg/m  Wt  Readings from Last 3 Encounters:  11/13/23 132 lb 12.8 oz (60.2 kg)  10/20/23 133 lb 4 oz (60.4 kg)  10/12/23 135 lb (61.2 kg)   Constitutional: normal weight, in NAD Eyes: EOMI, no exophthalmos ENT: no thyromegaly, no cervical lymphadenopathy Cardiovascular: RRR, No MRG Respiratory: CTA B Musculoskeletal: no deformities Skin: no rashes Neurological: no tremor with outstretched hands  Assessment: 1. Osteoporosis  2.  Hypothyroidism  3.  Perceived hypoglycemia  4. Elevated estrogen -This was due to biotin interference with the assay -No further follow-up is needed for this  5.  Thyroid calcifications -No further follow-up is needed for this  Other treating physicians:  Dr. Billy Coast Dr. Jacquenette Shone Dr. Berline Chough Dr. Judeth Horn [facility] [address] [place] [address]  Plan: 1. Osteoporosis -She sees an osteoporosis specialist (Dr. Jacquenette Shone), sports medicine, but would also want to see me for this problem. -Most likely age-related/postmenopausal and she also has a family history of osteoporosis.  She feels that drinking a lot of coffee in the past also contributed.  She is not doing this anymore.  She does have peripheral neuropathy which puts her at higher risk for falls.  No falls or fractures since last visit. -She had quite a few DXA scans in the last few years.  2018-2021: stable at the spine, however, decreased T-scores at the right and left femoral neck on the 2021 report 2022: the bone density obtained by Dr. Jacquenette Shone showed worsening spine BMD, at -3.9, and also worse right femoral neck T-scores.  At the level of the 33% distal radius, she had a very low T score, of -4.5.  We discussed that this translated into a significantly increased risk for fracture.   Between 2023 and 2024, she had 4 (!)  Bone density scans, with slightly varying results, but overall improvement between 2022-2024 Latest bone density scan was from 07/2023: Stable  -She previously refused  antiresorptive medications and anabolic agents.  However, she also saw several other providers, one of them recommending Evenity (Dr. Jacquenette Shone).  She started [facility] 02/2021, then  stopped it because of intolerance (muscle and joint aches), then restarted it at the half dose.  Plan was to continue with the half dose until 11/2022.  She then wanted my opinion to see her to continue afterwards.  My recommendation was to get 1-2 doses of Reclast.  She was interested in low-dose HRT to help with osteoporosis but I advised her that this would not be enough.  However, she did end up starting hormone replacement by integrative medicine.  After this, her osteoporosis specialist recommended Tymlos, I advised her that [facility] was generic and she may have a better co-pay for this.  After finishing with this we discussed about possibly using alendronate or Reclast, which she preferred, overlapping with osteoanabolic treatment by 1 month.  She agreed with this plan. -Since last visit, she started Tymlos but she did not tolerate it due to back pain.  After this, she started injectable exosome as mentioned above. -She will continue with the injectable exercise and have another bone density (she pays for these out-of-pocket) in 3 months.  We did discuss that DXA scans this frequently may not be less very, as changes in the bone usually take more time. -She continues on supplements for bone maintenance started by her integrative medicine provider.  In the past, we started vitamin A due to the risk of worsening osteoporosis.  She is on vitamin D 5000 units daily + K2 vitamin.  Latest vitamin D level was normal last month: Lab Results  Component Value Date   VD25OH 56.65 10/20/2023  -She tried to go to [facility] but she developed headaches and muscle aches and had to stop.  She retried this with the same results.  Currently exercising at the gym with a personal trainer. -She is on HRT by another provider (compounded  hormonal supplements including estrogen, testosterone, progesterone).  This was apparently titrated for bone improvement, which I again advised her is not recommended.  She did start to have symptoms of breast fullness and pain so she recently reduced the doses to half.  She continues vibration platform and weightbearing and balance exercises along with walking daily -Latest kidney function was normal: Lab Results  Component Value Date   BUN 13 09/11/2023   Lab Results  Component Value Date   CREATININE 0.63 09/11/2023  -I will see her back in 1 year  2.  Perceived hypothyroidism -She was started on thyroid hormones based on symptoms, but without a clear history of hypothyroidism.  Since her TFTs remains normal afterwards, she continued supplementation -She obtains her medical care from different providers so it is difficult to integrate all of her medical care -Latest TSH was reviewed: 10/16/2023: TSH 3.05, at goal -She continues on Armour 30 mg daily, dose previously increased by her integrative medicine provider.  However, she would also want to follow-up with me for history: -She feels good on this dose.  She does have fatigue hot flashes, possibly related to a viral infection - we discussed about taking the thyroid hormone every day, with water, >30 minutes before breakfast, separated by >4 hours from acid reflux medications, calcium, iron, multivitamins. Pt. is taking it correctly. -At today's visit, she would like to repeat her TFTs.  Will do this today.  3.  History of hypoglycemia -Possibly also with a component idiopathic postprandial syndrome (symptoms of hypoglycemia without a low glucose) -She has a history of low blood sugars in 2021 during a period of time when she could not eat well after periodontal surgery.  She is usually eating a plant-based diet. -She initially had a CGM but I advised against this since this usually estimates the low blood sugars and causes unnecessary  anxiety in this context. -HbA1c was reviewed and this was 5.3% at last visit, within the normal range -No recent episodes -We checked another HbA1c today and this was excellent, at 5.0%.  - Total time spent for the visit: 50 minutes, in precharting, postcharting, obtaining medical information from the epic and care everywhere along with records provide patient and also directly from the  pt, reviewing her  previous labs, evaluations, and treatments, reviewing her symptoms, counseling her about her endocrine conditions (please see the discussed topics above), and developing a plan to further evaluate and treat them; she had a number of questions which we addressed.   Component     Latest Ref Rng 11/13/2023  TSH     0.40 - 4.50 mIU/L 2.62   T4,Free(Direct)     0.8 - 1.8 ng/dL 1.2   Triiodothyronine,Free,Serum     2.3 - 4.2 pg/mL 3.1   Hemoglobin A1C     4.0 - 5.6 % 5.0   TSH has improved.  Labs are all at goal.  Carlus Pavlov, MD PhD [facility]

## 2023-11-14 LAB — T3, FREE: T3, Free: 3.1 pg/mL (ref 2.3–4.2)

## 2023-11-14 LAB — TSH: TSH: 2.62 m[IU]/L (ref 0.40–4.50)

## 2023-11-14 LAB — T4, FREE: Free T4: 1.2 ng/dL (ref 0.8–1.8)

## 2023-11-16 ENCOUNTER — Encounter: Payer: Self-pay | Admitting: Internal Medicine

## 2023-11-16 ENCOUNTER — Encounter: Payer: Self-pay | Admitting: Hematology

## 2023-11-16 ENCOUNTER — Telehealth: Payer: Self-pay

## 2023-11-16 NOTE — Telephone Encounter (Signed)
 Per Dr.Newton, pt is scheduled for a phone visit on 3/31 @ 4:30.   Pt aware vis voicemail.

## 2023-11-17 ENCOUNTER — Encounter: Payer: Self-pay | Admitting: Podiatry

## 2023-11-17 ENCOUNTER — Ambulatory Visit (INDEPENDENT_AMBULATORY_CARE_PROVIDER_SITE_OTHER): Payer: Medicare Other | Admitting: Podiatry

## 2023-11-17 ENCOUNTER — Telehealth: Payer: Self-pay

## 2023-11-17 DIAGNOSIS — M79674 Pain in right toe(s): Secondary | ICD-10-CM

## 2023-11-17 DIAGNOSIS — M79675 Pain in left toe(s): Secondary | ICD-10-CM | POA: Diagnosis not present

## 2023-11-17 DIAGNOSIS — M7989 Other specified soft tissue disorders: Secondary | ICD-10-CM

## 2023-11-17 DIAGNOSIS — I872 Venous insufficiency (chronic) (peripheral): Secondary | ICD-10-CM

## 2023-11-17 DIAGNOSIS — B351 Tinea unguium: Secondary | ICD-10-CM

## 2023-11-17 NOTE — Progress Notes (Unsigned)
 Subjective: Chief Complaint  Patient presents with   RFC    RM#29 RFC    68 year old female presents for concerns of thick, elongated nails.  They do cause discomfort and become elongated.  She felt they were trimmed too short last time she puts her appointment.  No swelling redness or drainage the toenail sites.  No other concerns.  Objective: AAO x3, NAD DP/PT pulses palpable bilaterally, CRT less than 3 seconds Nails are hypertrophic, dystrophic, brittle, discolored, elongated 10. No surrounding redness or drainage. Tenderness nails 1-5 bilaterally.  Mild incurvation of the nail borders of the right hallux toenail.  No pain with calf compression, swelling, warmth, erythema  Assessment: Ingrown toenail, symptomatic onychomycosis  Plan: -All treatment options discussed with the patient including all alternatives, risks, complications.  -Sharply debrided the nails x10 without any complications or bleeding the patient comfort. -Daily foot inspection  Return in about 9 weeks (around 10/26/2023).  Vivi Barrack DPM

## 2023-11-17 NOTE — Telephone Encounter (Signed)
 Referral, office note, demographics faxed to [facility] ([facility])on  [address]

## 2023-11-17 NOTE — Telephone Encounter (Signed)
-----   Message from Vivi Barrack sent at 11/17/2023 11:56 AM EDT ----- Can you fax referral to [facility] specialists? Thanks!

## 2023-11-19 ENCOUNTER — Other Ambulatory Visit: Payer: Self-pay

## 2023-11-19 ENCOUNTER — Other Ambulatory Visit: Payer: Self-pay | Admitting: Hematology

## 2023-11-19 ENCOUNTER — Encounter: Payer: Self-pay | Admitting: Obstetrics and Gynecology

## 2023-11-19 DIAGNOSIS — D508 Other iron deficiency anemias: Secondary | ICD-10-CM

## 2023-11-19 DIAGNOSIS — D510 Vitamin B12 deficiency anemia due to intrinsic factor deficiency: Secondary | ICD-10-CM

## 2023-11-19 NOTE — Progress Notes (Signed)
 HEMATOLOGY/ONCOLOGY CLINIC NOTE  Date of Service: 11/20/2023   Patient Care Team: Thana Ates, MD as PCP - General (Internal Medicine) Jens Som Madolyn Frieze, MD as PCP - Cardiology (Cardiology) [name], MD as Consulting Physician (Oncology) Glendale Chard, DO as Consulting Physician (Neurology) Jonnie Kind, MD as Consulting Physician (Family Medicine) Andrena Mews, DO as Consulting Physician (Sports Medicine) Johney Maine, MD as Consulting Physician (Hematology) [place] Chard, DO as Consulting Physician (Neurology)  CHIEF COMPLAINTS/PURPOSE OF CONSULTATION:  Follow-up for chronic idiopathic neutropenia and pernicious anemia  HISTORY OF PRESENTING ILLNESS:   Megan [name] is a wonderful 68 y.o. female who has been referred to [place] by Dr. Cephas Darby for evaluation and management of Chronic idiopathic neutropenia. The pt reports that she is doing well overall.   The pt reports a history of mold exposure, chronic mild neutropenia, and neuropathy. She denies frequent or abnormal infections.   The pt notes that she was first diagnosed with neuropathy in 2010, and has seen five neurologists. She endorses weakness and tingling in feet into mid-calf which has improved over the past 10 years. She has current weakness and concern for balance which limits her activity. She adds that the bottom of her feet occasionally become dark purple. She has had doppler studies and notes these ruled out vascular problems. She has seen holistic health practitioners in [place] in the past. She had a four out of five positives on western blot which revealed concerns for Lyme disease but apparently was not conclusive. She previously lived in [place]. She denies ever having a bulls eye rash.  The pt notes some difficulty with writing, which has not been progressive.   The pt has recently been worked up for elevated estrogen levels and a previous cyst, and was  previously taking Biotin. She stopped taking Boitin and has seen normalized estrogen levels. She notes that repeat MRI revealed the absence of the previously seen cyst  The pt also reports a history of polyclonal gammopathy.   She notes that her Ferritin was seen to be at 26 about 1.5 years ago. She has been a vegan and then began eating some meat protein. She also took PO iron replacement which was constipating. She also has taken Blood builder. Her Ferritin has recently decreased to a 10. Pt denies blood in the stools nor black stools. She had a colonoscopy about 1.5 years ago and notes this was unremarkable. She denies unexpected weight loss. She denies using acid suppressants. She endorses having pernicious anemia. Pt takes Vitamin B12 daily. She is also taking 10k units Vitamin D3 five days a week.   The pt notes that she has osteoporosis, with L1-L4 with score of -3.5. She has been disinclined to pursue Prolia injections. She has been consuming almond milk and avoids dairy products. She took Fosamax three years ago.  Most recent lab results (08/22/18) of CBC is as follows: all values are WNL except for WBC at 3.5k.  On review of systems, pt reports neuropathy with weakness in feet, stable energy levels, constipation, and denies blood in the stools, black stools, abdominal pains, and any other symptoms.   On PMHx the pt reports neuropathy, pernicious anemia, osteoporosis.  INTERVAL HISTORY:   Megan [name] is a 68 y.o. female here for continued evaluation and management of her pernicious anemia and chronic idiopathic neutropenia. Patient was last seen by me on 04/22/2023 and reported that she was told by  her rheumatologist that she may have Sjgren's syndrome. She also complained of neuropathy, mild burning sensation in her mouth which had improved, and side affects from her osteoporosis medication.  Today, she reports that she has been doing poorly overall since October 2024.    Patient reports that her endocrinologist reviewed her lab work and recommended she connect with hematology to discuss low total WBCs. CBC from 11/12/2023 showed total WBCs 2.6K.  Her WBCs improved today to 2.9K.   She reports that her iron levels were recently low. She reports that she generally does not tolerate oral iron.  Patient reports feeling extremely unwell during the time of her previous labs.   She reports having a colonoscopy/endoscopy in October which showed no concerning findings.   She takes three different compounded hormones with estrogen, testosterone, and progesterone.Patient recently switched to a new pharmacy due to high costs. She reports that her compounded hormones from the new pharmacy caused breast bleeding and were absorbed differently by her body causing abdominal bloating/distension for 3 months. Patient reports that she was found to have Small intestinal fungal overgrowth (SIFO). Patient received an X-ray by Dr. Rhea Belton which showed constipation with plenty of stool. Her bowels are normal at this time.  She denies any recent viral/respiratory infections. She reports that she was tested for COVID-19 while at urgent care and did not have any respiratory symptoms. Patient did endorse frequent non-watery diarrhea.   Patient reports having little to no energy and has stopped going to the gym.   Patient reports having low ferritin and vitamin D levels recently. She reports that her functional medicine doctor thought that there may have been a malabsorption issue.   Patient reports that she does not want to take steroids with pre meds for IV iron. She notes that she was previously given pepcid, tylenol, and antihistamine as part of her premeds.  Patient reports ankle swelling R>L since November 2024. She reports that she will have a lower extremity US on Monday, 11/23/2023.   Patient was seen by OB/GYN oncologist, Dr. Alvester Morin, and reports that she is likely going to have a  surgical biopsy of a small uterine cervical polyp, less than 1 cm in size. Her polyp has been stable for one year with no changes.  She complains of hair loss.  MEDICAL HISTORY:  Past Medical History:  Diagnosis Date   Allergic rhinitis    Allergy    Aneurysm of cardiac wall, congenital    a.  septal aneurysm extending into the RVOT (by cardiac MRI in 08/2013)   Anxiety    Aortic atherosclerosis (HCC)    Arthritis    Asthma    Basal cell carcinoma    Depression    Heart murmur    Hypothyroidism    on meds   Leukopenia    idiopathic   Lyme disease    Mitral valve prolapse    MVP (mitral valve prolapse)    Orthostatic hypotension    Osteoporosis    Pancreatic cyst    Peripheral neuropathy 06/09/2012   Pernicious anemia    Plantar fasciitis    Pneumothorax     SURGICAL HISTORY: Past Surgical History:  Procedure Laterality Date   COLONOSCOPY  2019   JMP-   COLONOSCOPY WITH PROPOFOL N/A 02/22/2013   Procedure: COLONOSCOPY WITH PROPOFOL;  Surgeon: Charolett Bumpers, MD;  Location: [facility];  Service: Endoscopy;  Laterality: N/A;   MOUTH SURGERY  01/2012   bone graft in mouth   NASAL  SINUS SURGERY Right 06/12/2014   Procedure: RIGHT ENDOSCOPIC MAXILLARY ANTROSTOMY;  Surgeon: Darletta Moll, MD;  Location: [facility];  Service: ENT;  Laterality: Right;   ROOT CANAL  02/27/2012   TONSILLECTOMY  1962   WISDOM TOOTH EXTRACTION      SOCIAL HISTORY: Social History   Socioeconomic History   Marital status: Divorced    Spouse name: Not on file   Number of children: 0   Years of education: Not on file   Highest education level: Not on file  Occupational History    Comment: Career and life coach  Tobacco Use   Smoking status: Never   Smokeless tobacco: Never  Vaping Use   Vaping status: Never Used  Substance and Sexual Activity   Alcohol use: No    Alcohol/week: 0.0 standard drinks of alcohol   Drug use: No   Sexual activity: Not Currently    Birth  control/protection: Post-menopausal  Other Topics Concern   Not on file  Social History Narrative   Patient is single and lives alone.   Patient is self-employed, career and life coaching.   Patient drinks two to four cups of caffeine daily.   Right handed    Social Drivers of Health   Financial Resource Strain: Not on file  Food Insecurity: No Food Insecurity (09/25/2023)   Hunger Vital Sign    Worried About Running Out of Food in the Last Year: Never true    Ran Out of Food in the Last Year: Never true  Transportation Needs: No Transportation Needs (09/25/2023)   PRAPARE - Administrator, Civil Service (Medical): No    Lack of Transportation (Non-Medical): No  Physical Activity: Not on file  Stress: Not on file  Social Connections: Not on file  Intimate Partner Violence: Not At Risk (09/25/2023)   Humiliation, Afraid, Rape, and Kick questionnaire    Fear of Current or Ex-Partner: No    Emotionally Abused: No    Physically Abused: No    Sexually Abused: No    FAMILY HISTORY: Family History  Problem Relation Age of Onset   Angina Mother    Hypertension Mother    Lung cancer Mother 49       again at 75/79-smoker   Colon cancer Mother 66   Colon polyps Mother    Hyperlipidemia Father    Heart failure Father    Prostate cancer Father 65   Kidney failure Father    Hypertension Father    Colon polyps Father    Breast cancer Paternal Grandmother    Diabetes Paternal Grandmother    Lung cancer Paternal Grandfather    Esophageal cancer Neg Hx    Liver cancer Neg Hx    Pancreatic cancer Neg Hx    Rectal cancer Neg Hx    Stomach cancer Neg Hx    Ovarian cancer Neg Hx    Endometrial cancer Neg Hx     ALLERGIES:  is allergic to augmentin [amoxicillin-pot clavulanate], clindamycin/lincomycin, iron, moxifloxacin, and sodium citrate.  MEDICATIONS:  Current Outpatient Medications  Medication Sig Dispense Refill   5-Hydroxytryptophan (5-HTP) 100 MG CAPS Take 100  mg by mouth daily.      albuterol (VENTOLIN HFA) 108 (90 Base) MCG/ACT inhaler Inhale 1 puff into the lungs daily as needed for wheezing or shortness of breath.     AMBULATORY NON FORMULARY MEDICATION 3 (three) times daily. Medication Name: Amino acids double wood     ARMOUR THYROID 30 MG  tablet Take 1 tablet (30 mg total) by mouth daily. 90 tablet 3   Ascorbic Acid (VITAMIN C) POWD Take 2,500 mg by mouth daily.     Cholecalciferol (VITAMIN D3) 10000 units capsule Take 10,000 Units by mouth daily.      Cyanocobalamin (VITAMIN B-12 ER PO) Take 5,000 mcg by mouth daily.      Cyanocobalamin (VITAMIN B-12 IJ) Inject 5,000 mcg as directed once a week.     estradiol (ESTRACE) 0.1 MG/GM vaginal cream Place 1 Applicatorful vaginally 3 (three) times a week.     MAGNESIUM CITRATE PO Take 1,000 mg by mouth daily.     MAGNESIUM OXIDE PO Take 290 mg by mouth daily.     Menaquinone-7 (VITAMIN K2) 100 MCG CAPS Take 1 capsule by mouth daily.     Multiple Vitamin (MULTIVITAMIN) capsule Take 1 capsule by mouth daily.     NON FORMULARY Take 1,000 mcg by mouth daily. Methyl Folate chewable tablet     NONFORMULARY OR COMPOUNDED ITEM Exosome 1 trillion, I injection monthy     NONFORMULARY OR COMPOUNDED ITEM Take 0.3125 mg by mouth daily. Estradiol troche     progesterone (PROMETRIUM) 100 MG capsule Take 1.5 capsules by mouth daily.     TESTOSTERONE UNDECANOATE PO Take 1 mg by mouth daily.     No current facility-administered medications for this visit.    REVIEW OF SYSTEMS:    10 Point review of Systems was done is negative except as noted above.   PHYSICAL EXAMINATION:  . Vitals:   11/20/23 0926  BP: 104/61  Pulse: 75  Resp: 16  Temp: 97.6 F (36.4 C)  SpO2: 100%   Filed Weights   11/20/23 0926  Weight: 134 lb 3.2 oz (60.9 kg)   .Body mass index is 19.82 kg/m.   GENERAL:alert, in no acute distress and comfortable SKIN: no acute rashes, no significant lesions EYES: conjunctiva are pink and  non-injected, sclera anicteric OROPHARYNX: MMM, no exudates, no oropharyngeal erythema or ulceration NECK: supple, no JVD LYMPH:  no palpable lymphadenopathy in the cervical, axillary or inguinal regions LUNGS: clear to auscultation b/l with normal respiratory effort HEART: regular rate & rhythm ABDOMEN:  normoactive bowel sounds , non tender, not distended. Extremity: no pedal edema PSYCH: alert & oriented x 3 with fluent speech NEURO: no focal motor/sensory deficits   LABORATORY DATA:  I have reviewed the data as listed  .    Latest Ref Rng & Units 11/20/2023    8:54 AM 09/11/2023   12:34 PM 06/08/2023    3:27 PM  CBC  WBC 4.0 - 10.5 K/uL 2.9  3.7  3.9   Hemoglobin 12.0 - 15.0 g/dL 16.1  09.6  04.5   Hematocrit 36.0 - 46.0 % 40.7  42.4  41.7   Platelets 150 - 400 K/uL 155  183.0  205.0    ANC 1700 .    Latest Ref Rng & Units 11/20/2023    8:54 AM 09/11/2023   12:34 PM 06/08/2023    3:27 PM  CMP  Glucose 70 - 99 mg/dL 94  75  85   BUN 8 - 23 mg/dL 12  13  12    Creatinine 0.44 - 1.00 mg/dL 4.09  8.11  9.14   Sodium 135 - 145 mmol/L 141  140  140   Potassium 3.5 - 5.1 mmol/L 3.7  3.6  4.0   Chloride 98 - 111 mmol/L 110  106  106   CO2 22 - 32  mmol/L 27  25  28    Calcium 8.9 - 10.3 mg/dL 9.1  9.1  9.7   Total Protein 6.5 - 8.1 g/dL 6.4  6.9  7.1   Total Bilirubin 0.0 - 1.2 mg/dL 0.7  0.6  0.5   Alkaline Phos 38 - 126 U/L 41  44  45   AST 15 - 41 U/L 22  23  28    ALT 0 - 44 U/L 10  11  17     . Lab Results  Component Value Date   IRON 94 10/20/2023   TIBC 354.2 10/20/2023   IRONPCTSAT 26.5 10/20/2023   (Iron and TIBC)  Lab Results  Component Value Date   FERRITIN 65.4 10/20/2023   . Lab Results  Component Value Date   IRON 146 11/20/2023   TIBC 326 11/20/2023   IRONPCTSAT 45 (H) 11/20/2023   (Iron and TIBC)  Lab Results  Component Value Date   FERRITIN 59 11/20/2023     RADIOGRAPHIC STUDIES: I have personally reviewed the radiological images as  listed and agreed with the findings in the report.  ASSESSMENT & PLAN:   68 y.o. female with  1. Chronic idiopathic leucopenia. WBC count today low normal @ 2.8k ANC 1800 ( no neutropenia) 2. Pernicious anemia -12/30/17 Antiparietal cell antibodies which were high at 105.9  PLAN:  -Discussed lab results on 11/20/2023 in detail with patient. CBC showed WBC of 2.9K, hemoglobin of 13.5, and platelets of 155K. -patient is not anemic -total WBCs are mildly low, though the breakup of WBCs is not concerning -this often happens form change in distribution of cells. Hormonal changes can affect how WBCs are distributed. -educated patient on importance of evaluating the specific types of WBCs within total WBCs -discussed that the specific main WBCs within her total WBCs are not low.  -neutrophils normal at 1.7K -Lymphocytes WNL -monocytes normal -discussed that her eosinophils and basophils are 0, which may be driving her overall counts to be low, though it is not a significant concern  -discussed that her recent stomach bug likely explains her low WBCs -recommend continuing to monitor her blood counts with no invasive testing at this time -iron labs are pending at time of clinical visit -iron saturation 26.5% on 10/20/2023 -protein electoporesis showed no abnormal monoclonal protein production. No indication of a bone marrow or plasma cell disorder. Her total antibody levels were mildly low and albumin high.  -T3, free one week ago was 3.1 pg/mL and TSH 2.62 mIU/L -educated patient that pernicious anemia can be associated with thyroid dysfunction or autoimmune leukopenia which can fluctuate thoughout the year -Discussed that slightly lower thyroid levels could contribute to constipation.  -recommend taking B12 sublingually to optimize absorption -recommend liposomal preparation of B complex in liquid form for optimal absorption -if she is able to tolerate, she can take oral iron with orange juice,  or if she cannot tolerate there is also option of IV iron -discussed option of iron replacement with IV infusion, especially if ferritin is below 50 -eduated patient that Ferritin can fluctuate as it is an inflammatory marker and iron saturation would also be looked at when evaluating iron levels -discussed specific reasons for goals of targeting ferritin > 100, closer to 200 with iron saturation closer to 20% -educated patient of potential reasons iron levels would be kept higher including if there is concern for absorption issue or chronic bleeding; vs just being optimized  -discussed that bacterial overgrowth can potentially cause malabsorption issues -discussed that  her hair loss is more likely form hormonal changes -recommend wearing compression socks to ensure there is no concern for blood clots given leg swelling R>L -proceed with lower extremity US on 11/23/2023 -discussed that there is likely no significant concern in regards to her small uterine cervical polyp and that there is a 2/3 chance of it being benign. Discussed that there would be a need for risk vs benefits discussion in regards to considerations of either monitoring or removal procedure.  -discussed that removal of a benign poylp from the cervix is generally not a complex procedure which can be completed by most OB/GYN doctors -answered all of patient's questions in detail -patient would like to RTC in August as previously scheduled  FOLLOW UP: F/u with Dr Candise Che in 4-5 months with labs  The total time spent in the appointment was 20 minutes* .  All of the patient's questions were answered with apparent satisfaction. The patient knows to call the clinic with any problems, questions or concerns.   Wyvonnia Lora MD MS AAHIVMS [facility] Hematology/Oncology Physician [facility]  .*Total Encounter Time as defined by the Centers for Medicare and Medicaid Services includes, in addition to the face-to-face time of a  patient visit (documented in the note above) non-face-to-face time: obtaining and reviewing outside history, ordering and reviewing medications, tests or procedures, care coordination (communications with other health care professionals or caregivers) and documentation in the medical record.    I,Mitra Faeizi,acting as a Neurosurgeon for Wyvonnia Lora, MD.,have documented all relevant documentation on the behalf of Wyvonnia Lora, MD,as directed by  Wyvonnia Lora, MD while in the presence of Wyvonnia Lora, MD.  .I have reviewed the above documentation for accuracy and completeness, and I agree with the above. Johney Maine MD

## 2023-11-20 ENCOUNTER — Inpatient Hospital Stay (HOSPITAL_BASED_OUTPATIENT_CLINIC_OR_DEPARTMENT_OTHER): Admitting: Hematology

## 2023-11-20 ENCOUNTER — Inpatient Hospital Stay: Attending: Psychiatry

## 2023-11-20 VITALS — BP 104/61 | HR 75 | Temp 97.6°F | Resp 16 | Wt 134.2 lb

## 2023-11-20 DIAGNOSIS — Z801 Family history of malignant neoplasm of trachea, bronchus and lung: Secondary | ICD-10-CM | POA: Diagnosis not present

## 2023-11-20 DIAGNOSIS — Z803 Family history of malignant neoplasm of breast: Secondary | ICD-10-CM | POA: Diagnosis not present

## 2023-11-20 DIAGNOSIS — D709 Neutropenia, unspecified: Secondary | ICD-10-CM | POA: Diagnosis present

## 2023-11-20 DIAGNOSIS — D508 Other iron deficiency anemias: Secondary | ICD-10-CM | POA: Diagnosis not present

## 2023-11-20 DIAGNOSIS — D510 Vitamin B12 deficiency anemia due to intrinsic factor deficiency: Secondary | ICD-10-CM

## 2023-11-20 DIAGNOSIS — D72819 Decreased white blood cell count, unspecified: Secondary | ICD-10-CM | POA: Diagnosis not present

## 2023-11-20 LAB — IRON AND IRON BINDING CAPACITY (CC-WL,HP ONLY)
Iron: 146 ug/dL (ref 28–170)
Saturation Ratios: 45 % — ABNORMAL HIGH (ref 10.4–31.8)
TIBC: 326 ug/dL (ref 250–450)
UIBC: 180 ug/dL (ref 148–442)

## 2023-11-20 LAB — CBC WITH DIFFERENTIAL (CANCER CENTER ONLY)
Abs Immature Granulocytes: 0 10*3/uL (ref 0.00–0.07)
Basophils Absolute: 0 10*3/uL (ref 0.0–0.1)
Basophils Relative: 1 %
Eosinophils Absolute: 0 10*3/uL (ref 0.0–0.5)
Eosinophils Relative: 1 %
HCT: 40.7 % (ref 36.0–46.0)
Hemoglobin: 13.5 g/dL (ref 12.0–15.0)
Immature Granulocytes: 0 %
Lymphocytes Relative: 32 %
Lymphs Abs: 0.9 10*3/uL (ref 0.7–4.0)
MCH: 31.3 pg (ref 26.0–34.0)
MCHC: 33.2 g/dL (ref 30.0–36.0)
MCV: 94.4 fL (ref 80.0–100.0)
Monocytes Absolute: 0.2 10*3/uL (ref 0.1–1.0)
Monocytes Relative: 8 %
Neutro Abs: 1.7 10*3/uL (ref 1.7–7.7)
Neutrophils Relative %: 58 %
Platelet Count: 155 10*3/uL (ref 150–400)
RBC: 4.31 MIL/uL (ref 3.87–5.11)
RDW: 12.4 % (ref 11.5–15.5)
WBC Count: 2.9 10*3/uL — ABNORMAL LOW (ref 4.0–10.5)
nRBC: 0 % (ref 0.0–0.2)

## 2023-11-20 LAB — CMP (CANCER CENTER ONLY)
ALT: 10 U/L (ref 0–44)
AST: 22 U/L (ref 15–41)
Albumin: 4.4 g/dL (ref 3.5–5.0)
Alkaline Phosphatase: 41 U/L (ref 38–126)
Anion gap: 4 — ABNORMAL LOW (ref 5–15)
BUN: 12 mg/dL (ref 8–23)
CO2: 27 mmol/L (ref 22–32)
Calcium: 9.1 mg/dL (ref 8.9–10.3)
Chloride: 110 mmol/L (ref 98–111)
Creatinine: 0.69 mg/dL (ref 0.44–1.00)
GFR, Estimated: >60 mL/min (ref >60)
Glucose, Bld: 94 mg/dL (ref 70–99)
Potassium: 3.7 mmol/L (ref 3.5–5.1)
Sodium: 141 mmol/L (ref 135–145)
Total Bilirubin: 0.7 mg/dL (ref 0.0–1.2)
Total Protein: 6.4 g/dL — ABNORMAL LOW (ref 6.5–8.1)

## 2023-11-20 LAB — FERRITIN: Ferritin: 59 ng/mL (ref 11–307)

## 2023-11-20 LAB — VITAMIN B12: Vitamin B-12: 6568 pg/mL — ABNORMAL HIGH (ref 180–914)

## 2023-11-23 ENCOUNTER — Inpatient Hospital Stay: Admitting: Psychiatry

## 2023-11-24 ENCOUNTER — Encounter: Payer: Self-pay | Admitting: Psychiatry

## 2023-11-24 ENCOUNTER — Inpatient Hospital Stay: Attending: Psychiatry | Admitting: Psychiatry

## 2023-11-24 DIAGNOSIS — N950 Postmenopausal bleeding: Secondary | ICD-10-CM | POA: Diagnosis not present

## 2023-11-24 DIAGNOSIS — N840 Polyp of corpus uteri: Secondary | ICD-10-CM | POA: Diagnosis not present

## 2023-11-24 MED ORDER — MISOPROSTOL 200 MCG PO TABS: 200.0000 ug | ORAL_TABLET | Freq: Once | ORAL | 0 refills | Status: DC

## 2023-11-24 NOTE — Progress Notes (Signed)
 Gynecologic Oncology Telehealth Follow-Up Note  I connected with Megan [name] on 12/08/23 at  8:15 AM EDT by telephone and verified that I am speaking with the correct person using two identifiers.  I discussed the limitations, risks, security and privacy concerns of performing an evaluation and management service by telemedicine and the availability of in-person appointments. I also discussed with the patient that there may be a patient responsible charge related to this service. The patient expressed understanding and agreed to proceed.  Other persons participating in the visit and their role in the encounter: none.  Patient's location: Home, [place] Provider's location: [facility]   Date of Service: 11/24/2023 Referring Provider: Meriam Stamp, MD   Assessment & Plan: Megan [name] is a 68 y.o. woman with ultrasound imaging showing a ~1cm endometrial versus endocervical polypoid mass.   Reviewed SIS report. Given findings, feel that it is reasonable to proceed with additional evaluation to confirm benign process. Recommend hysteroscopy, D&C, ECC for direct visualization and sampling. Reviewed possibility of not clearly visualizing area. Will otherwise complete sampling.  Pt feels that she did well with SIS previously when pre-treated with misoprostol. Rx sent to place vaginally preop.  Patient was consented for: Hysteroscopy, dilation and curettage, endocervical curettage on TBD.  The risks of surgery were discussed in detail and she understands these to including but not limited to bleeding requiring a blood transfusion, infection, injury to adjacent organs (including but not limited to the bowels, bladder, ureters, nerves, blood vessels), thromboembolic events, unforseen complication, possible need for re-exploration, and medical complications such as heart attack, stroke, pneumonia.  If the patient experiences any of these events, she understands that her hospitalization or  recovery may be prolonged and that she may need to take additional medications for a prolonged period. The patient will receive DVT and antibiotic prophylaxis as indicated. She voiced a clear understanding. She had the opportunity to ask questions and informed consent was obtained today. She wishes to proceed.  She does not require preoperative clearance. Her METs are >4.  All preoperative instructions were reviewed. Postoperative expectations were also reviewed.    RTC postop.  Derrel Flies, MD Gynecologic Oncology   Medical Decision Making I personally spent  TOTAL 20 minutes via phone in the care of this patient.    ----------------------- Reason for Visit: Follow-up   Interval History: Since her last visit, pt underwent SIS with Dr. Trinda Fuller office. No changes otherwise.   Past Medical/Surgical History: Past Medical History:  Diagnosis Date   Allergic rhinitis    Allergy    Aneurysm of cardiac wall, congenital    a.  septal aneurysm extending into the RVOT (by cardiac MRI in 08/2013)   Anxiety    Aortic atherosclerosis (HCC)    Arthritis    Asthma    Basal cell carcinoma    Depression    Heart murmur    Hypothyroidism    on meds   Leukopenia    idiopathic   Lyme disease    Mitral valve prolapse    MVP (mitral valve prolapse)    Orthostatic hypotension    Osteoporosis    Pancreatic cyst    Peripheral neuropathy 06/09/2012   Pernicious anemia    Plantar fasciitis    Pneumothorax     Past Surgical History:  Procedure Laterality Date   COLONOSCOPY  2019   JMP-   COLONOSCOPY WITH PROPOFOL N/A 02/22/2013   Procedure: COLONOSCOPY WITH PROPOFOL;  Surgeon: Garrett Kallman, MD;  Location:  [facility];  Service: Endoscopy;  Laterality: N/A;   MOUTH SURGERY  01/2012   bone graft in mouth   NASAL SINUS SURGERY Right 06/12/2014   Procedure: RIGHT ENDOSCOPIC MAXILLARY ANTROSTOMY;  Surgeon: Lawence Press, MD;  Location: [facility];  Service: ENT;   Laterality: Right;   ROOT CANAL  02/27/2012   TONSILLECTOMY  1962   WISDOM TOOTH EXTRACTION      Family History  Problem Relation Age of Onset   Angina Mother    Hypertension Mother    Lung cancer Mother 14       again at 75/79-smoker   Colon cancer Mother 31   Colon polyps Mother    Hyperlipidemia Father    Heart failure Father    Prostate cancer Father 75   Kidney failure Father    Hypertension Father    Colon polyps Father    Breast cancer Paternal Grandmother    Diabetes Paternal Grandmother    Lung cancer Paternal Grandfather    Esophageal cancer Neg Hx    Liver cancer Neg Hx    Pancreatic cancer Neg Hx    Rectal cancer Neg Hx    Stomach cancer Neg Hx    Ovarian cancer Neg Hx    Endometrial cancer Neg Hx     Social History   Socioeconomic History   Marital status: Divorced    Spouse name: Not on file   Number of children: 0   Years of education: Not on file   Highest education level: Not on file  Occupational History    Comment: Event organiser and life coach  Tobacco Use   Smoking status: Never   Smokeless tobacco: Never  Vaping Use   Vaping status: Never Used  Substance and Sexual Activity   Alcohol use: No    Alcohol/week: 0.0 standard drinks of alcohol   Drug use: No   Sexual activity: Not Currently    Birth control/protection: Post-menopausal  Other Topics Concern   Not on file  Social History Narrative   Patient is single and lives alone.   Patient is self-employed, career and life coaching.   Patient drinks two to four cups of caffeine daily.   Right handed    Social Drivers of Health   Financial Resource Strain: Not on file  Food Insecurity: No Food Insecurity (09/25/2023)   Hunger Vital Sign    Worried About Running Out of Food in the Last Year: Never true    Ran Out of Food in the Last Year: Never true  Transportation Needs: No Transportation Needs (09/25/2023)   PRAPARE - Administrator, Civil Service (Medical): No    Lack of  Transportation (Non-Medical): No  Physical Activity: Not on file  Stress: Not on file  Social Connections: Not on file    Current Medications:  Current Outpatient Medications:    5-Hydroxytryptophan (5-HTP) 100 MG CAPS, Take 100 mg by mouth daily. , Disp: , Rfl:    albuterol (VENTOLIN HFA) 108 (90 Base) MCG/ACT inhaler, Inhale 1 puff into the lungs daily as needed for wheezing or shortness of breath., Disp: , Rfl:    AMBULATORY NON FORMULARY MEDICATION, 3 (three) times daily. Medication Name: Amino acids double wood, Disp: , Rfl:    ARMOUR THYROID 30 MG tablet, Take 1 tablet (30 mg total) by mouth daily., Disp: 90 tablet, Rfl: 3   Ascorbic Acid (VITAMIN C) POWD, Take 2,500 mg by mouth daily., Disp: , Rfl:    Cholecalciferol (VITAMIN  D3) 10000 units capsule, Take 10,000 Units by mouth daily. , Disp: , Rfl:    Cyanocobalamin (VITAMIN B-12 ER PO), Take 5,000 mcg by mouth daily. , Disp: , Rfl:    Cyanocobalamin (VITAMIN B-12 IJ), Inject 5,000 mcg as directed once a week., Disp: , Rfl:    estradiol (ESTRACE) 0.1 MG/GM vaginal cream, Place 1 Applicatorful vaginally 3 (three) times a week., Disp: , Rfl:    MAGNESIUM CITRATE PO, Take 1,000 mg by mouth daily., Disp: , Rfl:    MAGNESIUM OXIDE PO, Take 290 mg by mouth daily., Disp: , Rfl:    Menaquinone-7 (VITAMIN K2) 100 MCG CAPS, Take 1 capsule by mouth daily., Disp: , Rfl:    Multiple Vitamin (MULTIVITAMIN) capsule, Take 1 capsule by mouth daily., Disp: , Rfl:    NON FORMULARY, Take 1,000 mcg by mouth daily. Methyl Folate chewable tablet, Disp: , Rfl:    NONFORMULARY OR COMPOUNDED ITEM, Exosome 1 trillion, I injection monthy, Disp: , Rfl:    NONFORMULARY OR COMPOUNDED ITEM, Take 0.3125 mg by mouth daily. Estradiol troche, Disp: , Rfl:    progesterone (PROMETRIUM) 100 MG capsule, Take 1.5 capsules by mouth daily., Disp: , Rfl:    TESTOSTERONE UNDECANOATE PO, Take 1 mg by mouth daily., Disp: , Rfl:   Review of Symptoms: Complete 10-system review  is positive for: none.  Physical Exam: Deferred given limitations of phone visit.  Laboratory & Radiologic Studies: none

## 2023-11-26 ENCOUNTER — Encounter: Payer: Self-pay | Admitting: Hematology

## 2023-12-01 NOTE — Progress Notes (Signed)
 Attempted to contact pt per Dr Candise Che to : let patient know her ferritin level is 59 with normal iron saturation. No acute indication for IV iron. Can f/u in 4-6 months with labs  Left pt above message

## 2023-12-08 ENCOUNTER — Encounter: Payer: Self-pay | Admitting: Hematology

## 2023-12-08 ENCOUNTER — Encounter: Payer: Self-pay | Admitting: Podiatry

## 2023-12-08 ENCOUNTER — Ambulatory Visit (INDEPENDENT_AMBULATORY_CARE_PROVIDER_SITE_OTHER): Admitting: Podiatry

## 2023-12-08 ENCOUNTER — Telehealth: Payer: Self-pay

## 2023-12-08 ENCOUNTER — Encounter: Payer: Self-pay | Admitting: Psychiatry

## 2023-12-08 DIAGNOSIS — M7989 Other specified soft tissue disorders: Secondary | ICD-10-CM

## 2023-12-08 DIAGNOSIS — M7751 Other enthesopathy of right foot: Secondary | ICD-10-CM

## 2023-12-08 NOTE — Telephone Encounter (Signed)
   Pre-operative Risk Assessment    Patient Name: Megan [name]  DOB: 1956-03-04 MRN: [id number]   Date of last office visit: 05/26/23 Date of next office visit: 12/10/23   Request for Surgical Clearance    Procedure:   Hysteroscopy D&C, polypectomy   Date of Surgery:  Clearance 12/24/23                                 Surgeon:  Moira Andrews L. Serge Dancer, MD  Surgeon's Group or Practice Name:  [facility]  Phone number:  [phone] Fax number:  [phone]   Type of Clearance Requested:   - Medical    Type of Anesthesia:   Choice    Additional requests/questions:    [name]   12/08/2023, 3:54 PM

## 2023-12-08 NOTE — Telephone Encounter (Signed)
   Name: Megan [name]  DOB: 1955/12/12  MRN: [id number]  Primary Cardiologist: Alexandria Angel, MD  Chart reviewed as part of pre-operative protocol coverage. The patient has an upcoming visit scheduled with Palmer Bobo, NP on 12/10/2023 at which time clearance can be addressed in case there are any issues that would impact surgical recommendations.  Hysterectomy D&C, polypectomy is not scheduled until 12/24/2023 as below. I added preop FYI to appointment note so that provider is aware to address at time of outpatient visit.  Per office protocol the cardiology provider should forward their finalized clearance decision and recommendations regarding antiplatelet therapy to the requesting party below.    I will route this message as FYI to requesting party and remove this message from the preop box as separate preop APP input not needed at this time.   Please call with any questions.  Ava Boatman, NP  12/08/2023, 4:25 PM

## 2023-12-09 ENCOUNTER — Telehealth: Payer: Self-pay

## 2023-12-09 NOTE — Telephone Encounter (Signed)
 2nd attempt to reach patient in regards to May 20 th date for procedure with Dr. Daisey Dryer. Left voicemail requesting call back.

## 2023-12-09 NOTE — Telephone Encounter (Signed)
-----   Message from Suellyn Emory sent at 12/09/2023 12:16 PM EDT ----- Regarding: FW: hysteroscopy D&C Can someone please reach out to this patient and see if she is ok with may 20 for procedure. If so please leave me a note on my desk. Thanks ----- Message ----- From: Derrel Flies, MD Sent: 12/08/2023   3:34 PM EDT To: Suellyn Emory, NP Subject: RE: hysteroscopy D&C                           That is fine with me. Can have team call and offer that date to her. ----- Message ----- From: Suellyn Emory, NP Sent: 12/08/2023   3:31 PM EDT To: Derrel Flies, MD Subject: RE: hysteroscopy D&C                           You have nothing on May 20 right now. If that is too late can look at the days where you already have things booked. ----- Message ----- From: Derrel Flies, MD Sent: 12/08/2023  10:49 AM EDT To: Suellyn Emory, NP Subject: hysteroscopy D&C                               When do you think we might be able to get this lady scheduled? I have already verbally consented. Would just need you/karen preop.  Heidi Llamas

## 2023-12-09 NOTE — Telephone Encounter (Signed)
 LVM for patient to call office regarding potential surgery date of 5/20

## 2023-12-10 ENCOUNTER — Ambulatory Visit: Attending: Emergency Medicine | Admitting: Emergency Medicine

## 2023-12-10 ENCOUNTER — Encounter: Payer: Self-pay | Admitting: Hematology

## 2023-12-10 ENCOUNTER — Encounter: Payer: Self-pay | Admitting: Psychiatry

## 2023-12-10 ENCOUNTER — Encounter: Payer: Self-pay | Admitting: Emergency Medicine

## 2023-12-10 VITALS — BP 100/64 | HR 63 | Ht 69.0 in | Wt 135.0 lb

## 2023-12-10 DIAGNOSIS — R002 Palpitations: Secondary | ICD-10-CM | POA: Insufficient documentation

## 2023-12-10 DIAGNOSIS — Z01810 Encounter for preprocedural cardiovascular examination: Secondary | ICD-10-CM | POA: Diagnosis present

## 2023-12-10 DIAGNOSIS — I700 Atherosclerosis of aorta: Secondary | ICD-10-CM | POA: Diagnosis present

## 2023-12-10 DIAGNOSIS — E782 Mixed hyperlipidemia: Secondary | ICD-10-CM | POA: Diagnosis present

## 2023-12-10 DIAGNOSIS — I872 Venous insufficiency (chronic) (peripheral): Secondary | ICD-10-CM | POA: Diagnosis present

## 2023-12-10 DIAGNOSIS — R0789 Other chest pain: Secondary | ICD-10-CM | POA: Insufficient documentation

## 2023-12-10 DIAGNOSIS — I491 Atrial premature depolarization: Secondary | ICD-10-CM | POA: Insufficient documentation

## 2023-12-10 NOTE — Telephone Encounter (Signed)
 Megan [name] returned call stating she is declining having her surgery with Dr.Newton on 5/21. Reports has decided to go to another Gynecologist who has agreed to do her surgery on 12/24/23. (Did not elaborate on details) Megan [name] states she is going to send Dr.Newton a Mychart message explaining her decision.   Message sent to Dr.Newton

## 2023-12-10 NOTE — H&P (Signed)
 68 year old PMP female with endocervical / endometrial polyp noted on SHG.  She also has cervical stenosis.  Past Medical History:  Diagnosis Date   Allergic rhinitis    Allergy    Aneurysm of cardiac wall, congenital    a.  septal aneurysm extending into the RVOT (by cardiac MRI in 08/2013)   Anxiety    Aortic atherosclerosis (HCC)    Arthritis    Asthma    Basal cell carcinoma    Depression    Heart murmur    Hypothyroidism    on meds   Leukopenia    idiopathic   Lyme disease    Mitral valve prolapse    MVP (mitral valve prolapse)    Orthostatic hypotension    Osteoporosis    Pancreatic cyst    Peripheral neuropathy 06/09/2012   Pernicious anemia    Plantar fasciitis    Pneumothorax    Past Surgical History:  Procedure Laterality Date   COLONOSCOPY  2019   JMP-   COLONOSCOPY WITH PROPOFOL N/A 02/22/2013   Procedure: COLONOSCOPY WITH PROPOFOL;  Surgeon: Garrett Kallman, MD;  Location: [facility];  Service: Endoscopy;  Laterality: N/A;   MOUTH SURGERY  01/2012   bone graft in mouth   NASAL SINUS SURGERY Right 06/12/2014   Procedure: RIGHT ENDOSCOPIC MAXILLARY ANTROSTOMY;  Surgeon: Lawence Press, MD;  Location: [facility];  Service: ENT;  Laterality: Right;   ROOT CANAL  02/27/2012   TONSILLECTOMY  1962   WISDOM TOOTH EXTRACTION     No current facility-administered medications on file prior to encounter.   Current Outpatient Medications on File Prior to Encounter  Medication Sig Dispense Refill   5-Hydroxytryptophan (5-HTP) 100 MG CAPS Take 100 mg by mouth daily.      albuterol (VENTOLIN HFA) 108 (90 Base) MCG/ACT inhaler Inhale 1 puff into the lungs daily as needed for wheezing or shortness of breath.     AMBULATORY NON FORMULARY MEDICATION 3 (three) times daily. Medication Name: Amino acids double wood     ARMOUR THYROID 30 MG tablet Take 1 tablet (30 mg total) by mouth daily. 90 tablet 3   Ascorbic Acid (VITAMIN C) POWD Take 2,500 mg by mouth  daily.     Cholecalciferol (VITAMIN D3) 10000 units capsule Take 10,000 Units by mouth daily.      Cyanocobalamin (VITAMIN B-12 ER PO) Take 5,000 mcg by mouth daily.      Cyanocobalamin (VITAMIN B-12 IJ) Inject 5,000 mcg as directed once a week.     estradiol (ESTRACE) 0.1 MG/GM vaginal cream Place 1 Applicatorful vaginally 3 (three) times a week.     MAGNESIUM CITRATE PO Take 1,000 mg by mouth daily.     MAGNESIUM OXIDE PO Take 290 mg by mouth daily.     Menaquinone-7 (VITAMIN K2) 100 MCG CAPS Take 1 capsule by mouth daily.     misoprostol (CYTOTEC) 200 MCG tablet Take 1 tablet (200 mcg total) by mouth once for 1 dose. Place around 12 hours prior to your procedure 1 tablet 0   Multiple Vitamin (MULTIVITAMIN) capsule Take 1 capsule by mouth daily.     NON FORMULARY Take 1,000 mcg by mouth daily. Methyl Folate chewable tablet     NONFORMULARY OR COMPOUNDED ITEM Exosome 1 trillion, I injection monthy     NONFORMULARY OR COMPOUNDED ITEM Take 0.3125 mg by mouth daily. Estradiol troche     progesterone (PROMETRIUM) 100 MG capsule Take 1.5 capsules by mouth daily.  TESTOSTERONE UNDECANOATE PO Take 1 mg by mouth daily.     Prior to Admission medications   Medication Sig Start Date End Date Taking? Authorizing Provider  5-Hydroxytryptophan (5-HTP) 100 MG CAPS Take 100 mg by mouth daily.     [provider]  albuterol (VENTOLIN HFA) 108 (90 Base) MCG/ACT inhaler Inhale 1 puff into the lungs daily as needed for wheezing or shortness of breath. 05/24/20   [provider]  AMBULATORY NON FORMULARY MEDICATION 3 (three) times daily. Medication Name: Amino acids double wood    [provider]  ARMOUR THYROID 30 MG tablet Take 1 tablet (30 mg total) by mouth daily. 04/09/22   Emilie Harden, MD  Ascorbic Acid (VITAMIN C) POWD Take 2,500 mg by mouth daily.    [provider]  Cholecalciferol (VITAMIN D3) 10000 units capsule Take 10,000 Units by mouth daily.     [provider]  Cyanocobalamin (VITAMIN B-12 ER PO) Take 5,000 mcg by mouth daily.     [provider]  Cyanocobalamin (VITAMIN B-12 IJ) Inject 5,000 mcg as directed once a week.    [provider]  estradiol (ESTRACE) 0.1 MG/GM vaginal cream Place 1 Applicatorful vaginally 3 (three) times a week.    [provider]  MAGNESIUM CITRATE PO Take 1,000 mg by mouth daily.    [provider]  MAGNESIUM OXIDE PO Take 290 mg by mouth daily.    [provider]  Menaquinone-7 (VITAMIN K2) 100 MCG CAPS Take 1 capsule by mouth daily. 08/25/20   [provider]  misoprostol (CYTOTEC) 200 MCG tablet Take 1 tablet (200 mcg total) by mouth once for 1 dose. Place around 12 hours prior to your procedure 11/24/23 11/24/23  Derrel Flies, MD  Multiple Vitamin (MULTIVITAMIN) capsule Take 1 capsule by mouth daily.    [provider]  NON FORMULARY Take 1,000 mcg by mouth daily. Methyl Folate chewable tablet    [provider]  NONFORMULARY OR COMPOUNDED ITEM Exosome 1 trillion, I injection monthy    [provider]  NONFORMULARY OR COMPOUNDED ITEM Take 0.3125 mg by mouth daily. Estradiol troche    [provider]  progesterone (PROMETRIUM) 100 MG capsule Take 1.5 capsules by mouth daily.    [provider]  TESTOSTERONE UNDECANOATE PO Take 1 mg by mouth daily.    [provider]   Allergies Augmentin [amoxicillin-pot clavulanate], Clindamycin/lincomycin, Iron, Moxifloxacin, and Sodium citrate Family History  Problem Relation Age of Onset   Angina Mother    Hypertension Mother    Lung cancer Mother 15       again at 75/79-smoker   Colon cancer Mother 70   Colon polyps Mother    Hyperlipidemia Father    Heart failure Father    Prostate cancer Father 34   Kidney failure Father    Hypertension Father    Colon polyps Father    Breast cancer Paternal Grandmother    Diabetes Paternal Grandmother    Lung  cancer Paternal Grandfather    Esophageal cancer Neg Hx    Liver cancer Neg Hx    Pancreatic cancer Neg Hx    Rectal cancer Neg Hx    Stomach cancer Neg Hx    Ovarian cancer Neg Hx    Endometrial cancer Neg Hx    Social History   Socioeconomic History   Marital status: Divorced    Spouse name: Not on file   Number of children: 0   Years of education:  Not on file   Highest education level: Not on file  Occupational History    Comment: Career and life coach  Tobacco Use   Smoking status: Never   Smokeless tobacco: Never  Vaping Use   Vaping status: Never Used  Substance and Sexual Activity   Alcohol use: No    Alcohol/week: 0.0 standard drinks of alcohol   Drug use: No   Sexual activity: Not Currently    Birth control/protection: Post-menopausal  Other Topics Concern   Not on file  Social History Narrative   Patient is single and lives alone.   Patient is self-employed, career and life coaching.   Patient drinks two to four cups of caffeine daily.   Right handed    Social Drivers of Health   Financial Resource Strain: Not on file  Food Insecurity: No Food Insecurity (09/25/2023)   Hunger Vital Sign    Worried About Running Out of Food in the Last Year: Never true    Ran Out of Food in the Last Year: Never true  Transportation Needs: No Transportation Needs (09/25/2023)   PRAPARE - Administrator, Civil Service (Medical): No    Lack of Transportation (Non-Medical): No  Physical Activity: Not on file  Stress: Not on file  Social Connections: Not on file   There were no vitals taken for this visit. General alert and oriented Lung CTAB Car RRR  Abdomen is soft and non tender   IMPRESSION: Endocervical polyp /Endometrial Polyp  PLAN: D and C, Hysteroscopy Polyp extraction

## 2023-12-10 NOTE — Patient Instructions (Signed)
 Medication Instructions:  NO CHANGES   Lab Work: NONE   Testing/Procedures: NONE  Follow-Up: At [facility], you and your health needs are our priority.  As part of our continuing mission to provide you with exceptional heart care, our providers are all part of one team.  This team includes your primary Cardiologist (physician) and Advanced Practice Providers or APPs (Physician Assistants and Nurse Practitioners) who all work together to provide you with the care you need, when you need it.  Your next appointment:   KEEP SCHEDULED APPOINTMENT WITH CAITLIN WALKER, NP  Provider:   Alexandria Angel, MD        Other Instructions:    1st Floor: - Lobby - Registration  - Pharmacy  - Lab - Cafe  2nd Floor: - PV Lab - Diagnostic Testing (echo, CT, nuclear med)  3rd Floor: - Vacant  4th Floor: - TCTS (cardiothoracic surgery) - AFib Clinic - Structural Heart Clinic - Vascular Surgery  - Vascular Ultrasound  5th Floor: - HeartCare Cardiology (general and EP) - Clinical Pharmacy for coumadin, hypertension, lipid, weight-loss medications, and med management appointments    Valet parking services will be available as well.

## 2023-12-10 NOTE — Progress Notes (Signed)
 Cardiology Office Note:    Date:  12/10/2023  ID:  Megan [name], DOB 1956-05-05, MRN [id number] PCP: Tena Feeling, MD  [facility] Providers Cardiologist:  Alexandria Angel, MD       Patient Profile:      Chief Complaint: Preoperative cardiovascular exam History of Present Illness:  Megan [name] is a 68 y.o. female with visit-pertinent history of aneurysm of membranous ventricular septum, thyroid disease, aortic atherosclerosis, mitral valve prolapse  MRA 08/2013 showed aneurysmal dilation of membranous ventricular septum extending into RV outflow tract with no sinus of Valsalva aneurysm.  Carotid duplex 03/2016 was normal.  Echocardiogram 12/2016 showed normal LVEF, mild MR.  Stress echo 09/2017 was normal.  CTA 01/2018 showed aneurysm of membranous ventricular septum that bulges into the RV outflow tract.  No connection between LV and RV.  No sinus Valsalva aneurysm.  Coronary calcium of 0.  ABIs 03/2019 revealed noncompressible LLE artery and normal to the right.  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  ZIO monitor 01/2020 due to palpitation showed SR, SB, ST with rare PAC/PVC and brief PAT.  ABIs 09/2020 are normal.  Seen in clinic on 08/27/2022 for chest pain and palpitations.  ZIO and CTA ordered.  Monitor showed predominantly NSR, average rate 70 bpm, 6% PAC burden.  Toprol 25 mg daily was added.  Coronary CTA was ordered however patient was no respiratory medications for CTA and was instead scheduled for a coronary calcium score which was 0.  Echocardiogram 11/2022 with outside provider showed LVEF 65 to 70%, no RWMA, RV normal, borderline MVP, trace TR.  Carotid duplex 03/2023 with no stenosis.  She was last seen in clinic on 05/26/2023 by Jerrold Morgan, NP.  Patient was doing well at time without acute cardiovascular concerns or complaints.  She had rare chest pain at rest which was  atypical for angina.  She noted ongoing dental issue requiring soft diet that was limiting heart healthy dieting.  She was exercising regularly at her spin class.  No medication changes were made.  She was to follow-up in 6 months.  On 11/12/2023 patient had called into nurse triage line and noted some chest pain and leg swelling.  She was scheduled for office visit.    She is also pending hysterectomy D&C, polypectomy on 12/24/2023 with [facility].   Discussed the use of AI scribe software for clinical note transcription with the patient, who gave verbal consent to proceed.  History of Present Illness Megan [name] presents to clinic today for clearance for an upcoming hysteroscopy due to a suspected lesion or polyp in the uterus.  Today she is without acute cardiovascular concerns or complaints and notes she feels "good".  However the patient reports a recent episode of chest discomfort, night sweats, lack of energy, and leg swelling, which lasted for about two to three weeks that started approximately 6 weeks ago. The chest pain was random, lasting less than a minute, and typically occurred at rest.  She reports that she felt "ill" and "sick" and felt like she had a viral illness.  She reports she has been symptom-free without recurrent chest discomfort over the past 3 to 4 weeks.  The patient also reports feeling an extra heartbeat occasionally. Despite these symptoms, the patient maintains an active lifestyle, regularly participating in spin class, yoga, and weight lifting. The patient also walks two to three miles daily.  She denies any exertional angina during her extensive workouts.  The patient has a history of GI issues, which she believes may have contributed to her recent symptoms.  Review of systems:  Please see the history of present illness. All other systems are reviewed and otherwise negative.     Home Medications:    Current Meds  Medication Sig   5-Hydroxytryptophan (5-HTP) 100  MG CAPS Take 100 mg by mouth daily.    albuterol (VENTOLIN HFA) 108 (90 Base) MCG/ACT inhaler Inhale 1 puff into the lungs daily as needed for wheezing or shortness of breath.   AMBULATORY NON FORMULARY MEDICATION 3 (three) times daily. Medication Name: Amino acids double wood   ARMOUR THYROID 30 MG tablet Take 1 tablet (30 mg total) by mouth daily.   Ascorbic Acid (VITAMIN C) POWD Take 2,500 mg by mouth daily.   Cholecalciferol (VITAMIN D3) 10000 units capsule Take 10,000 Units by mouth daily.    Cyanocobalamin (VITAMIN B-12 ER PO) Take 5,000 mcg by mouth daily.    Cyanocobalamin (VITAMIN B-12 IJ) Inject 5,000 mcg as directed once a week.   estradiol (ESTRACE) 0.1 MG/GM vaginal cream Place 1 Applicatorful vaginally 3 (three) times a week.   MAGNESIUM CITRATE PO Take 1,000 mg by mouth daily.   MAGNESIUM OXIDE PO Take 290 mg by mouth daily.   Menaquinone-7 (VITAMIN K2) 100 MCG CAPS Take 1 capsule by mouth daily.   Multiple Vitamin (MULTIVITAMIN) capsule Take 1 capsule by mouth daily.   NON FORMULARY Take 1,000 mcg by mouth daily. Methyl Folate chewable tablet   NONFORMULARY OR COMPOUNDED ITEM Exosome 1 trillion, I injection monthy   NONFORMULARY OR COMPOUNDED ITEM Take 0.3125 mg by mouth daily. Estradiol troche   progesterone (PROMETRIUM) 100 MG capsule Take 1.5 capsules by mouth daily.   TESTOSTERONE UNDECANOATE PO Take 1 mg by mouth daily.   Studies Reviewed:       CT cardiac scoring 09/25/2022 Coronary calcium score of 0. This was 1st percentile for age-, race-, and sex-matched controls.  ZIO 08/27/2022 Patch Wear Time:  13 days and 0 hours (2024-01-03T15:31:02-499 to 2024-01-16T16:14:30-0500)   Patient had a min HR of 48 bpm, max HR of 156 bpm, and avg HR of 70 bpm. Predominant underlying rhythm was Sinus Rhythm. Slight P wave morphology changes were noted. 2 Ventricular Tachycardia runs occurred, the run with the fastest interval lasting 4  beats with a max rate of 156 bpm, the longest  lasting 7 beats with an avg rate of 114 bpm. 8 Supraventricular Tachycardia runs occurred, the run with the fastest interval lasting 5 beats with a max rate of 138 bpm, the longest lasting 6 beats with an avg  rate of 97 bpm. Some episodes of Supraventricular Tachycardia may be possible Atrial Tachycardia with variable block. Isolated SVEs were frequent (6.1%, 74502), SVE Couplets were rare (<1.0%, 4170), and SVE Triplets were rare (<1.0%, 364). Isolated VEs  were rare (<1.0%, 3986), VE Couplets were rare (<1.0%, 9), and VE Triplets were rare (<1.0%, 1). Ventricular Bigeminy was present. Risk Assessment/Calculations:             Physical Exam:   VS:  BP 100/64 (BP Location: Left Arm, Patient Position: Sitting, Cuff Size: Normal)   Pulse 63   Ht 5\' 9"  (1.753 m)   Wt 135 lb (61.2 kg)   SpO2 100%   BMI 19.94 kg/m    Wt Readings from Last 3 Encounters:  12/10/23 135 lb (61.2 kg)  11/20/23 134 lb  3.2 oz (60.9 kg)  11/13/23 132 lb 12.8 oz (60.2 kg)    GEN: Well nourished, well developed in no acute distress NECK: No JVD; No carotid bruits CARDIAC: RRR, no murmurs, rubs, gallops RESPIRATORY:  Clear to auscultation without rales, wheezing or rhonchi  ABDOMEN: Soft, non-tender, non-distended EXTREMITIES:  No edema; No acute deformity     Assessment and Plan:  Preoperative clearance According to the Revised Cardiac Risk Index (RCRI), her Perioperative Risk of Major Cardiac Event is (%): 0.4. Her Functional Capacity in METs is: 6.79 according to the Duke Activity Status Index (DASI). Therefore, based on ACC/AHA guidelines, patient would be at acceptable risk for the planned procedure without further cardiovascular testing. I will route this recommendation to the requesting party via Epic fax function.   Atypical chest pain CT cardiac scoring 09/25/2022 with CAC score 0 Echocardiogram 11/2022 with EF 65 to 70% She does have a long history of atypical chest pain.  She continues to note rare chest  pains at rest with recent episode of intermittent chest discomfort occurring approximately 6 weeks ago in the setting of GI illness.  She does remain quite active regularly participating in spin class, yoga, and weightlifting without any anginal symptoms. - EKG today shows NSR with PAC and no acute ischemic changes - Her chest discomfort remains atypical occurring at rest without exertional symptoms.  No indication for further ischemic evaluation at this time  Aortic atherosclerosis / Hyperlipidemia LDL 115 on 07/2024 Normal lipoprotein A and apolipoprotein B on 04/2023 With recent coronary calcium score of 0, supports nonaggressive treatment however would prefer her LDL to be less than 100.  She notes with recent stressors in her life her diet can be improved Patient prefers lifestyle management over medication at this time - Recommend updating fasting lipid panel in 27-month follow-up  Palpitations ZIO with 6% PAC burden, brief episodes of SVT which were asymptomatic - She continues to remain asymptomatic - H/o bradycardia, would avoid daily AV nodal blocking agent - Continue metoprolol tartrate 12.5 mg as needed for palpitations  Venous insufficiency Follows with vascular surgery.  It was noted she is not a candidate for any invasive venous intervention - Continue leg elevation, compression stockings, and regular exercise      Dispo: Follow-up with scheduled appointment in July 2025  Signed, Ava Boatman, NP

## 2023-12-12 NOTE — Progress Notes (Signed)
 Subjective: Chief Complaint  Patient presents with   Foot Swelling    RM#12 Follow up on right foot swelling has decreased some but foot is still swollen causing pain.Patient has a previous xray that she has brought with her trying to decrease radiation exposure.     68 year old female presents the above concerns.  She did follow-up with [place] vein specialist and then she was informed that although she does have some venous insufficiency is not significant enough to cause the swelling.  She did take Celebrex for just a couple of days which seemed to help with the swelling.  Although swelling still better than what it was she still having discomfort to the ankle.   Objective: AAO x3, NAD DP/PT pulses palpable bilaterally, CRT less than 3 seconds There is edema present to the right leg worse than left and there are mild varicosities noted.  There is no ulcerations.  She still gets edema to the ankle with tenderness palpation along the course of her flexor tendons.  Not able to appreciate any area pinpoint tenderness.  Ankle, subtalar joint range of motion intact. No pain with calf compression, swelling, warmth, erythema  Assessment: Right ankle swelling, tendinitis  Plan: Right leg swelling, tendinitis -She is previous had x-rays and she with a hold off on further x-rays.  She is seeing other providers for this as well.  We ruled out venous issues causing the swelling.  I do that this is more consistent with tendinitis.  We could always get an MRI advanced imaging but would not start physical therapy to help with the ankle but if no improvement would recommend MRI at that time.  Continue shoes, good support.  Bracing if needed.  Charity Conch DPM

## 2023-12-16 ENCOUNTER — Encounter (HOSPITAL_COMMUNITY): Payer: Self-pay | Admitting: Obstetrics and Gynecology

## 2023-12-16 NOTE — Progress Notes (Signed)
 Spoke w/ via phone for pre-op interview--- Thersia Flax Lab needs dos---- CBC, and T&S per surgeon. BMP Wille Harms).         Lab results------ Current EKG in Epic dated 417/25. Current Echo results 65-70% dated 12/23/22 in Epic. COVID test -----patient states asymptomatic no test needed Arrive at -------1100 NPO after MN NO Solid Food.  Clear liquids from MN until---1000 Pre-Surgery Ensure or G2:  Med rec completed Medications to take morning of surgery -----Armour Thyroid  Diabetic medication -----  GLP1 agonist last dose: GLP1 instructions:  Patient instructed no nail polish to be worn day of surgery Patient instructed to bring photo id and insurance card day of surgery Patient aware to have Driver (ride ) / caregiver    for 24 hours after surgery - Jackson Massed Patient Special Instructions ----- Shower with antibacterial soap. Pre-Op special Instructions -----  Patient verbalized understanding of instructions that were given at this phone interview. Patient denies chest pain, sob, fever, cough at the interview.

## 2023-12-24 ENCOUNTER — Encounter (HOSPITAL_COMMUNITY): Payer: Self-pay | Admitting: Obstetrics and Gynecology

## 2023-12-24 ENCOUNTER — Ambulatory Visit (HOSPITAL_COMMUNITY)

## 2023-12-24 ENCOUNTER — Other Ambulatory Visit: Payer: Self-pay

## 2023-12-24 ENCOUNTER — Ambulatory Visit (HOSPITAL_COMMUNITY)
Admission: RE | Admit: 2023-12-24 | Discharge: 2023-12-24 | Disposition: A | Discharge status: Home / Self Care | Attending: Obstetrics and Gynecology | Admitting: Obstetrics and Gynecology

## 2023-12-24 ENCOUNTER — Encounter (HOSPITAL_COMMUNITY)
Admission: RE | Disposition: A | Discharge status: Home / Self Care | Payer: Self-pay | Source: Home / Self Care | Attending: Obstetrics and Gynecology

## 2023-12-24 ENCOUNTER — Ambulatory Visit (HOSPITAL_BASED_OUTPATIENT_CLINIC_OR_DEPARTMENT_OTHER)

## 2023-12-24 DIAGNOSIS — Z01818 Encounter for other preprocedural examination: Secondary | ICD-10-CM

## 2023-12-24 DIAGNOSIS — N840 Polyp of corpus uteri: Secondary | ICD-10-CM

## 2023-12-24 DIAGNOSIS — E039 Hypothyroidism, unspecified: Secondary | ICD-10-CM | POA: Insufficient documentation

## 2023-12-24 DIAGNOSIS — M4802 Spinal stenosis, cervical region: Secondary | ICD-10-CM | POA: Diagnosis not present

## 2023-12-24 DIAGNOSIS — J45909 Unspecified asthma, uncomplicated: Secondary | ICD-10-CM | POA: Insufficient documentation

## 2023-12-24 DIAGNOSIS — N841 Polyp of cervix uteri: Secondary | ICD-10-CM | POA: Diagnosis not present

## 2023-12-24 DIAGNOSIS — Z79890 Hormone replacement therapy (postmenopausal): Secondary | ICD-10-CM | POA: Insufficient documentation

## 2023-12-24 HISTORY — DX: Dislocation of jaw, unspecified side, initial encounter: S03.00XA

## 2023-12-24 HISTORY — PX: HYSTEROSCOPY WITH D & C: SHX1775

## 2023-12-24 LAB — BASIC METABOLIC PANEL WITH GFR
Anion gap: 10 (ref 5–15)
BUN: 15 mg/dL (ref 8–23)
CO2: 23 mmol/L (ref 22–32)
Calcium: 9.3 mg/dL (ref 8.9–10.3)
Chloride: 108 mmol/L (ref 98–111)
Creatinine, Ser: 0.72 mg/dL (ref 0.44–1.00)
GFR, Estimated: >60 mL/min (ref >60)
Glucose, Bld: 72 mg/dL (ref 70–99)
Potassium: 3.4 mmol/L — ABNORMAL LOW (ref 3.5–5.1)
Sodium: 141 mmol/L (ref 135–145)

## 2023-12-24 LAB — CBC
HCT: 43.8 % (ref 36.0–46.0)
Hemoglobin: 14.3 g/dL (ref 12.0–15.0)
MCH: 31.4 pg (ref 26.0–34.0)
MCHC: 32.6 g/dL (ref 30.0–36.0)
MCV: 96.1 fL (ref 80.0–100.0)
Platelets: 163 10*3/uL (ref 150–400)
RBC: 4.56 MIL/uL (ref 3.87–5.11)
RDW: 12.4 % (ref 11.5–15.5)
WBC: 4.2 10*3/uL (ref 4.0–10.5)
nRBC: 0 % (ref 0.0–0.2)

## 2023-12-24 LAB — TYPE AND SCREEN
ABO/RH(D): O POS
Antibody Screen: NEGATIVE

## 2023-12-24 LAB — ABO/RH: ABO/RH(D): O POS

## 2023-12-24 SURGERY — DILATATION AND CURETTAGE /HYSTEROSCOPY
Anesthesia: General | Site: Vagina

## 2023-12-24 MED ORDER — LACTATED RINGERS IV SOLN: INTRAVENOUS | Status: DC

## 2023-12-24 MED ORDER — MIDAZOLAM HCL 2 MG/2ML IJ SOLN
INTRAMUSCULAR | Status: AC
Start: 2023-12-24 — End: ?
  Filled 2023-12-24: qty 2

## 2023-12-24 MED ORDER — SODIUM CHLORIDE 0.9 % IR SOLN
Status: DC | PRN
  Administered 2023-12-24: 3000 mL

## 2023-12-24 MED ORDER — PROPOFOL 10 MG/ML IV BOLUS
INTRAVENOUS | Status: DC | PRN
  Administered 2023-12-24: 100 mg via INTRAVENOUS

## 2023-12-24 MED ORDER — OXYCODONE HCL 5 MG PO TABS: 5.0000 mg | ORAL_TABLET | Freq: Once | ORAL | Status: DC | PRN

## 2023-12-24 MED ORDER — PHENYLEPHRINE 80 MCG/ML (10ML) SYRINGE FOR IV PUSH (FOR BLOOD PRESSURE SUPPORT)
PREFILLED_SYRINGE | INTRAVENOUS | Status: AC
  Filled 2023-12-24: qty 10

## 2023-12-24 MED ORDER — LIDOCAINE 2% (20 MG/ML) 5 ML SYRINGE
INTRAMUSCULAR | Status: AC
  Filled 2023-12-24: qty 5

## 2023-12-24 MED ORDER — MIDAZOLAM HCL 2 MG/2ML IJ SOLN
INTRAMUSCULAR | Status: DC | PRN
  Administered 2023-12-24: 2 mg via INTRAVENOUS

## 2023-12-24 MED ORDER — ORAL CARE MOUTH RINSE: 15.0000 mL | Freq: Once | OROMUCOSAL | Status: AC

## 2023-12-24 MED ORDER — DEXAMETHASONE SODIUM PHOSPHATE 10 MG/ML IJ SOLN
INTRAMUSCULAR | Status: DC | PRN
  Administered 2023-12-24: 10 mg via INTRAVENOUS

## 2023-12-24 MED ORDER — POVIDONE-IODINE 10 % EX SWAB: 2.0000 | Freq: Once | CUTANEOUS | Status: DC

## 2023-12-24 MED ORDER — FENTANYL CITRATE (PF) 100 MCG/2ML IJ SOLN: 25.0000 ug | INTRAMUSCULAR | Status: DC | PRN

## 2023-12-24 MED ORDER — FENTANYL CITRATE (PF) 250 MCG/5ML IJ SOLN
INTRAMUSCULAR | Status: AC
  Filled 2023-12-24: qty 5

## 2023-12-24 MED ORDER — OXYCODONE HCL 5 MG/5ML PO SOLN: 5.0000 mg | Freq: Once | ORAL | Status: DC | PRN

## 2023-12-24 MED ORDER — LIDOCAINE HCL 1 % IJ SOLN
INTRAMUSCULAR | Status: AC
  Filled 2023-12-24: qty 20

## 2023-12-24 MED ORDER — PROPOFOL 10 MG/ML IV BOLUS
INTRAVENOUS | Status: AC
  Filled 2023-12-24: qty 20

## 2023-12-24 MED ORDER — LIDOCAINE HCL 1 % IJ SOLN
INTRAMUSCULAR | Status: DC | PRN
  Administered 2023-12-24: 10 mL

## 2023-12-24 MED ORDER — ONDANSETRON HCL 4 MG/2ML IJ SOLN
INTRAMUSCULAR | Status: AC
  Filled 2023-12-24: qty 2

## 2023-12-24 MED ORDER — CHLORHEXIDINE GLUCONATE 0.12 % MT SOLN
OROMUCOSAL | Status: AC
  Filled 2023-12-24: qty 15

## 2023-12-24 MED ORDER — CHLORHEXIDINE GLUCONATE 0.12 % MT SOLN
15.0000 mL | Freq: Once | OROMUCOSAL | Status: AC
  Administered 2023-12-24: 15 mL via OROMUCOSAL

## 2023-12-24 MED ORDER — EPHEDRINE 5 MG/ML INJ
INTRAVENOUS | Status: AC
  Filled 2023-12-24: qty 5

## 2023-12-24 MED ORDER — LIDOCAINE 2% (20 MG/ML) 5 ML SYRINGE
INTRAMUSCULAR | Status: DC | PRN
  Administered 2023-12-24: 60 mg via INTRAVENOUS

## 2023-12-24 MED ORDER — EPHEDRINE SULFATE-NACL 50-0.9 MG/10ML-% IV SOSY
PREFILLED_SYRINGE | INTRAVENOUS | Status: DC | PRN
  Administered 2023-12-24: 10 mg via INTRAVENOUS

## 2023-12-24 MED ORDER — DEXAMETHASONE SODIUM PHOSPHATE 10 MG/ML IJ SOLN
INTRAMUSCULAR | Status: AC
  Filled 2023-12-24: qty 1

## 2023-12-24 MED ORDER — ONDANSETRON HCL 4 MG/2ML IJ SOLN
INTRAMUSCULAR | Status: DC | PRN
  Administered 2023-12-24: 4 mg via INTRAVENOUS

## 2023-12-24 MED ORDER — ONDANSETRON HCL 4 MG/2ML IJ SOLN: 4.0000 mg | Freq: Once | INTRAMUSCULAR | Status: DC | PRN

## 2023-12-24 MED ORDER — FENTANYL CITRATE (PF) 250 MCG/5ML IJ SOLN
INTRAMUSCULAR | Status: DC | PRN
  Administered 2023-12-24: 50 ug via INTRAVENOUS

## 2023-12-24 MED ORDER — PHENYLEPHRINE 80 MCG/ML (10ML) SYRINGE FOR IV PUSH (FOR BLOOD PRESSURE SUPPORT)
PREFILLED_SYRINGE | INTRAVENOUS | Status: DC | PRN
  Administered 2023-12-24 (×2): 80 ug via INTRAVENOUS

## 2023-12-24 SURGICAL SUPPLY — 12 items
CATH ROBINSON RED A/P 16FR (CATHETERS) ×1 IMPLANT
ELECT COAG BIPOL BALL 21FR (ELECTRODE) IMPLANT
ELECTRODE REM PT RTRN 9FT ADLT (ELECTROSURGICAL) IMPLANT
GLOVE BIO SURGEON STRL SZ 6.5 (GLOVE) ×1 IMPLANT
GLOVE SURG UNDER POLY LF SZ7 (GLOVE) ×1 IMPLANT
GOWN STRL REUS W/ TWL LRG LVL3 (GOWN DISPOSABLE) ×2 IMPLANT
KIT PROCEDURE FLUENT (KITS) ×1 IMPLANT
LOOP CUTTING BIPOLAR 21FR (ELECTRODE) IMPLANT
PACK VAGINAL MINOR WOMEN LF (CUSTOM PROCEDURE TRAY) ×1 IMPLANT
PAD OB MATERNITY 11 LF (PERSONAL CARE ITEMS) ×1 IMPLANT
SOL .9 NS 3000ML IRR UROMATIC (IV SOLUTION) IMPLANT
TOWEL GREEN STERILE FF (TOWEL DISPOSABLE) ×2 IMPLANT

## 2023-12-24 NOTE — Anesthesia Procedure Notes (Signed)
 Procedure Name: LMA Insertion Date/Time: 12/24/2023 1:16 PM  Performed by: Danasha Melman C, CRNAPre-anesthesia Checklist: Patient identified, Emergency Drugs available, Suction available and Patient being monitored Patient Re-evaluated:Patient Re-evaluated prior to induction Oxygen Delivery Method: Circle System Utilized Preoxygenation: Pre-oxygenation with 100% oxygen Induction Type: IV induction Ventilation: Mask ventilation without difficulty LMA: LMA inserted LMA Size: 3.0 Number of attempts: 1 Airway Equipment and Method: Bite block Placement Confirmation: positive ETCO2 Tube secured with: Tape Dental Injury: Teeth and Oropharynx as per pre-operative assessment

## 2023-12-24 NOTE — Anesthesia Preprocedure Evaluation (Addendum)
 Anesthesia Evaluation  Patient identified by MRN, date of birth, ID band Patient awake    Reviewed: Allergy & Precautions, NPO status , Patient's Chart, lab work & pertinent test results  Airway Mallampati: I  TM Distance: >3 FB     Dental no notable dental hx. (+) Teeth Intact, Dental Advisory Given   Pulmonary asthma    Pulmonary exam normal breath sounds clear to auscultation       Cardiovascular Normal cardiovascular exam+ Valvular Problems/Murmurs MVP  Rhythm:Regular Rate:Normal  Congenital aneurysm of ventricular wall  Echo 01/01/23 LVEF 65-70%    Neuro/Psych  PSYCHIATRIC DISORDERS Anxiety Depression    Peripheral neuropathy  Neuromuscular disease    GI/Hepatic negative GI ROS, Neg liver ROS,,,  Endo/Other  Hypothyroidism    Renal/GU negative Renal ROS  negative genitourinary   Musculoskeletal  (+) Arthritis , Osteoarthritis,    Abdominal   Peds  Hematology  (+) Blood dyscrasia, anemia   Anesthesia Other Findings   Reproductive/Obstetrics Endometrial Polyp                              Anesthesia Physical Anesthesia Plan  ASA: 2  Anesthesia Plan: General   Post-op Pain Management: Minimal or no pain anticipated, Tylenol  PO (pre-op)* and Precedex   Induction: Intravenous  PONV Risk Score and Plan: 4 or greater and Treatment may vary due to age or medical condition, Ondansetron  and Dexamethasone   Airway Management Planned: LMA  Additional Equipment: None  Intra-op Plan:   Post-operative Plan: Extubation in OR  Informed Consent: I have reviewed the patients History and Physical, chart, labs and discussed the procedure including the risks, benefits and alternatives for the proposed anesthesia with the patient or authorized representative who has indicated his/her understanding and acceptance.     Dental advisory given  Plan Discussed with: CRNA and  Anesthesiologist  Anesthesia Plan Comments:          Anesthesia Quick Evaluation

## 2023-12-24 NOTE — Anesthesia Postprocedure Evaluation (Signed)
 Anesthesia Post Note  Patient: Megan [name]  Procedure(s) Performed: DILATATION AND CURETTAGE /HYSTEROSCOPY (Vagina )     Patient location during evaluation: PACU Anesthesia Type: General Level of consciousness: awake and alert and oriented Pain management: pain level controlled Vital Signs Assessment: post-procedure vital signs reviewed and stable Respiratory status: spontaneous breathing, nonlabored ventilation and respiratory function stable Cardiovascular status: blood pressure returned to baseline and stable Postop Assessment: no apparent nausea or vomiting Anesthetic complications: no   No notable events documented.  Last Vitals:  Vitals:   12/24/23 1121 12/24/23 1348  BP: (!) 104/52   Pulse: 63   Resp: 16 14  Temp: 36.5 C 36.7 C  SpO2: 95%     Last Pain:  Vitals:   12/24/23 1348  TempSrc:   PainSc: 0-No pain                 Angelmarie Ponzo A.

## 2023-12-24 NOTE — Discharge Instructions (Signed)
   D & C Home care Instructions:   Personal hygiene:  Used sanitary napkins for vaginal drainage not tampons. Shower or tub bathe the day after your procedure. No douching until bleeding stops. Always wipe from front to back after  Elimination.  Activity: Do not drive or operate any equipment today. The effects of the anesthesia are still present and drowsiness may result. Rest today, not necessarily flat bed rest, just take it easy. You may resume your normal activity in one to 2 days.  Sexual activity: No intercourse for one week or as indicated by your physician  Diet: Eat a light diet as desired this evening. You may resume a regular diet tomorrow.  Return to work: One to 2 days.  General Expectations of your surgery: Vaginal bleeding should be no heavier than a normal period. Spotting may continue up to 10 days. Mild cramps may continue for a couple of days. You may have a regular period in 2-6 weeks.  Unexpected observations call your doctor if these occur: persistent or heavy bleeding. Severe abdominal cramping or pain. Elevation of temperature greater than 100F.  Call for an appointment in one week.

## 2023-12-24 NOTE — Brief Op Note (Signed)
 12/24/2023  1:38 PM  PATIENT:  Megan [name]  68 y.o. female  PRE-OPERATIVE DIAGNOSIS:  POLYP  POST-OPERATIVE DIAGNOSIS:  POLYP  PROCEDURE:  Procedure(s) with comments: DILATATION AND CURETTAGE /HYSTEROSCOPY (N/A) - NO MYOSURE  SURGEON:  Surgeons and Role:    * Thurman Flores, MD - Primary  PHYSICIAN ASSISTANT:   ASSISTANTS: none   ANESTHESIA:   paracervical block and MAC  EBL:  10 mL   BLOOD ADMINISTERED:none  DRAINS: none   LOCAL MEDICATIONS USED:  LIDOCAINE    SPECIMEN:  Source of Specimen:  uterine curettings with endocervical polyp  DISPOSITION OF SPECIMEN:  PATHOLOGY  COUNTS:  YES  TOURNIQUET:  * No tourniquets in log *  DICTATION: .Other Dictation: Dictation Number dictated  PLAN OF CARE: Discharge to home after PACU  PATIENT DISPOSITION:  PACU - hemodynamically stable.   Delay start of Pharmacological VTE agent (>24hrs) due to surgical blood loss or risk of bleeding: not applicable

## 2023-12-24 NOTE — Transfer of Care (Signed)
 Immediate Anesthesia Transfer of Care Note  Patient: Megan [name]  Procedure(s) Performed: DILATATION AND CURETTAGE /HYSTEROSCOPY (Vagina )  Patient Location: PACU  Anesthesia Type:General  Level of Consciousness: awake, alert , and oriented  Airway & Oxygen Therapy: Patient Spontanous Breathing and Patient connected to face mask oxygen  Post-op Assessment: Report given to RN and Post -op Vital signs reviewed and stable  Post vital signs: Reviewed and stable  Last Vitals:  Vitals Value Taken Time  BP 119/61 12/24/23 1347  Temp    Pulse 83 12/24/23 1350  Resp 13 12/24/23 1350  SpO2 100 % 12/24/23 1350  Vitals shown include unfiled device data.  Last Pain:  Vitals:   12/24/23 1121  TempSrc: Oral         Complications: No notable events documented.

## 2023-12-24 NOTE — Progress Notes (Signed)
 H and P on the chart  Did take cytotec   Proceed with D and C, Hysteroscopy

## 2023-12-25 ENCOUNTER — Encounter (HOSPITAL_COMMUNITY): Payer: Self-pay | Admitting: Obstetrics and Gynecology

## 2023-12-25 LAB — SURGICAL PATHOLOGY

## 2023-12-25 NOTE — Op Note (Signed)
 NAMELINDYN, TOPALIAN MEDICAL RECORD NO: [id number] ACCOUNT NO: [id number] DATE OF BIRTH: 25-Dec-1955 FACILITY: [facility] LOCATION: [facility] PHYSICIAN: Yacine Garriga L. Serge Dancer, MD  Operative Report   DATE OF PROCEDURE: 12/24/2023  PREOPERATIVE DIAGNOSIS:  Polyp.  POSTOPERATIVE DIAGNOSES:  Endocervical polyp and cervical stenosis.  PROCEDURE:  D and C, hysteroscopy, polyp removal.  SURGEON:  Dartanyan Deasis L. Serge Dancer, MD  ANESTHESIA:  MAC with paracervical.  COMPLICATIONS:  None.  DESCRIPTION OF PROCEDURE:  The patient was taken to the operating room.  Anesthesia was administered.  She was prepped and draped in the usual sterile fashion.  A speculum was placed in the vagina.  The cervix was grasped with a tenaculum.  A  paracervical block was performed.  The cervix was dilated with a Geneticist, molecular.  The uterus was sounded.  After the cervix was gently dilated, the hysteroscope was introduced into the cervical canal.  With good visualization, we could see that there was  a polyp that was consistent with what was seen with preoperative ultrasonography. I then advanced the hysteroscope and advanced and looked in the uterine cavity.  The uterine cavity looked very good.  Tubal ostia were seen.  I then removed the  hysteroscope and inserted polyp forceps.  I could feel the polyp, extracted the polyp.  Reintroduced the hysteroscope.  There was a little bit of fragment at the base left, so I removed the hysteroscope and then inserted a sharp curette and thoroughly  curetted.  All tissue was sent to pathology.  The hysteroscope was reinserted, and the uterine cavity was cleaned. All sponge, lap, and instrument counts were correct x2.  The patient went to the recovery room in stable condition.  PATHOLOGY:  Polyp with endometrial curettings.   VAI D: 12/25/2023 9:47:50 am T: 12/25/2023 9:55:00 am  JOB: [id number]/ [id number]

## 2024-01-19 ENCOUNTER — Ambulatory Visit: Admitting: Podiatry

## 2024-01-19 DIAGNOSIS — M7751 Other enthesopathy of right foot: Secondary | ICD-10-CM | POA: Diagnosis not present

## 2024-01-19 DIAGNOSIS — M79674 Pain in right toe(s): Secondary | ICD-10-CM

## 2024-01-19 DIAGNOSIS — M7989 Other specified soft tissue disorders: Secondary | ICD-10-CM | POA: Diagnosis not present

## 2024-01-19 DIAGNOSIS — M79675 Pain in left toe(s): Secondary | ICD-10-CM

## 2024-01-19 DIAGNOSIS — B351 Tinea unguium: Secondary | ICD-10-CM | POA: Diagnosis not present

## 2024-01-19 NOTE — Progress Notes (Unsigned)
 Subjective: Chief Complaint  Patient presents with   Nail Problem    RM 14 Patient is here for routine foot care and follow-up on right ankle pain after PT.      68 year old female presents the above concerns.  She was doing physical therapy and she feels it has been helping the swelling is also improved.  No recent injury or changes.    Her nails are also thickened discolored has difficulty trimming them and they cause discomfort.  No open lesions.    Previously followed up with [place] vein specialist for venous insufficiency.   Objective: AAO x3, NAD DP/PT pulses palpable bilaterally, CRT less than 3 seconds Edema to the ankle is much improved.  There are some slight discomfort on the flexor tendons but there is no area of his.  Ankle, subtalar range of motion intact but any restrictions. Nails are hypertrophic, dystrophic, brittle, discolored, elongated 10. No surrounding redness or drainage. Tenderness nails 1-5 bilaterally. No open lesions or pre-ulcerative lesions are identified today. No pain with calf compression, swelling, warmth, erythema  Assessment: Right ankle swelling, tendinitis; symptomatic onychomycosis  Plan: Right leg swelling, tendinitis - Overall symptoms are improving.  Recommended continued physical therapy, shoes with good arch support.   - Symptoms are improving we will hold off on advanced imaging, MRI.  Symptomatic onychomycosis - Sharply debrided nails x 10 without any complications or bleeding.  Return in about 9 weeks (around 03/22/2024).   Charity Conch DPM

## 2024-02-23 ENCOUNTER — Encounter: Payer: Self-pay | Admitting: Internal Medicine

## 2024-02-29 ENCOUNTER — Ambulatory Visit (HOSPITAL_BASED_OUTPATIENT_CLINIC_OR_DEPARTMENT_OTHER): Admitting: Family

## 2024-03-21 ENCOUNTER — Telehealth: Payer: Self-pay | Admitting: Internal Medicine

## 2024-03-21 NOTE — Telephone Encounter (Signed)
 Pt scheduled to see Dr Albertus 05/03/24@11am , she is aware of appt.

## 2024-03-21 NOTE — Telephone Encounter (Signed)
 Patient requesting sooner apt with provider. No availability on my end. Requesting nurse. Please advise.

## 2024-03-22 ENCOUNTER — Encounter: Payer: Self-pay | Admitting: Podiatry

## 2024-03-22 ENCOUNTER — Ambulatory Visit (INDEPENDENT_AMBULATORY_CARE_PROVIDER_SITE_OTHER): Admitting: Podiatry

## 2024-03-22 DIAGNOSIS — L989 Disorder of the skin and subcutaneous tissue, unspecified: Secondary | ICD-10-CM | POA: Diagnosis not present

## 2024-03-22 DIAGNOSIS — M7751 Other enthesopathy of right foot: Secondary | ICD-10-CM

## 2024-03-22 DIAGNOSIS — M79674 Pain in right toe(s): Secondary | ICD-10-CM

## 2024-03-22 DIAGNOSIS — M722 Plantar fascial fibromatosis: Secondary | ICD-10-CM | POA: Diagnosis not present

## 2024-03-22 DIAGNOSIS — M79675 Pain in left toe(s): Secondary | ICD-10-CM | POA: Diagnosis not present

## 2024-03-22 DIAGNOSIS — B351 Tinea unguium: Secondary | ICD-10-CM

## 2024-03-22 NOTE — Patient Instructions (Signed)
  Choose a moisturizer from  the list below:  For normal skin: Moisturize feet once daily; do not apply between toes A.  CeraVe Daily Moisturizing Lotion B.  Lubriderm Advanced Therapy Lotion or Lubriderm Intense Skin Repair Lotion C.  Aquaphor Intensive Repair Lotion D.  Gold Bond Ultimate Diabetic Foot Lotion E.  Eucerin Intensive Repair Moisturizing Lotion  For extremely dry, cracked feet: moisturize feet once daily; do not apply between toes A. CeraVe Healing Ointment B. Eucerin Aquaphor Advanced Repair Cream C. Vaseline Petroleum Healing Jelly   If you have problems reaching your feet: apply to feet once daily; do not apply between toes A. Aquaphor Advanced Therapy Ointment Body Spray  B.  Vaseline Intensive Care Spray Moisturizer (Unscented,  Cocoa Radiant Spray or Aloe Smooth Spray)

## 2024-03-22 NOTE — Progress Notes (Signed)
 Subjective: Chief Complaint  Patient presents with   Follow-up    Right ankle swelling 0 pain, tendinitis; symptomatic onychomycosis. Toenail trim.     68 year old female presents the above concerns.  She was doing physical therapy and she feels it has been helping the swelling is also improved.  She is some discomfort the arch of her foot.  She has some tightness along the Achilles tendon into the side of the ankle no swelling.  No pain currently.  Her nails are also thickened discolored has difficulty trimming them and they cause discomfort.  No open lesions.    Previously followed up with [place] vein specialist for venous insufficiency.   Objective: AAO x3, NAD DP/PT pulses palpable bilaterally, CRT less than 3 seconds There is no significant edema to the ankle.  Some slight discomfort along the medial band of plantar fascia.  There is no area pinpoint tenderness.  No pain to the ankle.  No edema, erythema. Nails are hypertrophic, dystrophic, brittle, discolored, elongated 10. No surrounding redness or drainage. Tenderness nails 1-5 bilaterally. No open lesions or pre-ulcerative lesions are identified today. Punctate annular hyperkeratotic lesions noted lateral aspect right foot without any underlying ulceration, drainage or signs of infection. No pain with calf compression, swelling, warmth, erythema  Assessment: Plantar fasciitis, tendinitis; symptomatic onychomycosis  Plan: Plan fasciitis, tendinitis - Overall symptoms are improving.  Recommended continued home physical therapy, shoes with good arch support.  We discussed shoes as well.  Skin lesions -Sharply debrided the any complications.  Offered to apply Cantharone today.  She was follow-up on this.  Recommend moisture.  Symptomatic onychomycosis - Sharply debrided nails x 10 without any complications or bleeding.  Return in about 2 months (around 05/23/2024).  Megan [name] DPM

## 2024-03-28 ENCOUNTER — Ambulatory Visit (HOSPITAL_COMMUNITY)
Admission: RE | Admit: 2024-03-28 | Discharge: 2024-03-28 | Disposition: A | Discharge status: Home / Self Care | Source: Ambulatory Visit | Attending: Surgery | Admitting: Surgery

## 2024-03-28 ENCOUNTER — Other Ambulatory Visit (HOSPITAL_COMMUNITY): Payer: Self-pay | Admitting: Physician Assistant

## 2024-03-28 DIAGNOSIS — M79604 Pain in right leg: Secondary | ICD-10-CM | POA: Diagnosis present

## 2024-03-31 LAB — NMR, LIPOPROFILE
Cholesterol, Total: 193 mg/dL (ref 100–199)
HDL Particle Number: 36.3 umol/L (ref >=30.5)
HDL-C: 77 mg/dL (ref >39)
LDL Particle Number: 918 nmol/L (ref <1000)
LDL Size: 21 nm (ref >20.5)
LDL-C (NIH Calc): 108 mg/dL — ABNORMAL HIGH (ref 0–99)
LP-IR Score: 26 (ref <=45)
Small LDL Particle Number: <90 nmol/L (ref <=527)
Triglycerides: 42 mg/dL (ref 0–149)

## 2024-04-01 ENCOUNTER — Ambulatory Visit (HOSPITAL_BASED_OUTPATIENT_CLINIC_OR_DEPARTMENT_OTHER): Payer: Self-pay | Admitting: Family

## 2024-04-12 ENCOUNTER — Ambulatory Visit (INDEPENDENT_AMBULATORY_CARE_PROVIDER_SITE_OTHER): Admitting: Family

## 2024-04-12 ENCOUNTER — Encounter (HOSPITAL_BASED_OUTPATIENT_CLINIC_OR_DEPARTMENT_OTHER): Payer: Self-pay | Admitting: Family

## 2024-04-12 ENCOUNTER — Encounter (HOSPITAL_BASED_OUTPATIENT_CLINIC_OR_DEPARTMENT_OTHER): Payer: Self-pay

## 2024-04-12 VITALS — BP 102/78 | HR 65 | Ht 69.0 in | Wt 132.6 lb

## 2024-04-12 DIAGNOSIS — E782 Mixed hyperlipidemia: Secondary | ICD-10-CM | POA: Diagnosis not present

## 2024-04-12 DIAGNOSIS — R002 Palpitations: Secondary | ICD-10-CM | POA: Diagnosis not present

## 2024-04-12 DIAGNOSIS — M545 Low back pain, unspecified: Secondary | ICD-10-CM

## 2024-04-12 MED ORDER — METOPROLOL TARTRATE 25 MG PO TABS: ORAL_TABLET | ORAL | 1 refills | Status: AC

## 2024-04-12 NOTE — Progress Notes (Signed)
 Cardiology Office Note:  .   Date:  04/12/2024  ID:  Megan [name], DOB 07/18/1956, MRN [id number] PCP: Dwight Trula SQUIBB, MD  [facility] Providers Cardiologist:  Redell Shallow, MD    History of Present Illness: Megan [name] is a 68 y.o. female  with a hx of aneurysm of membranous ventricular septum, thyroid  disease, aortic atherosclerosis.   Prior MRA 08/2013 aneurysmal dilation of membranous ventricular septum extending into RV outflow tract with no sinus of valsalva aneurysm. Carotid duplex 03/2015 normal. Echo 12/2016 normal LVEF, mild MR. Stress echo 09/2017 normal. CTA 01/2018 aneurysm of membranous ventricular septum that bulges into RV outflow tract. No connection between LV and RV. No sinus of valsalva aneurysm. Calcium  score 0. ABIs 03/2019 revealed noncompressible LLE artery and normal right. 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 Monitor 01/2020 due to palpitations SR, SB, ST with rare PAC/PVC and brief PAT. ABIs 09/2020 normal.    Seen 05/08/22 noting occasional chest discomfort at rest or with activity that was atypical for angina not recommended for further workup.    Seen 08/27/22 for chest pain, palpitations. ZIO and CTA ordered. Monitor with predominantly NSR, average rate 70 bpm, 6% PAC burden. Toprol  25mg  QD added. CTA scheduled for 09/26/22.However she was nervous about pre-medications for CTA and was instead scheduled for coronary calcium  score which was 0.   Echo 11/2022 at outside provider LVEF 65-70%, no RWMA, RV normal, borderline MVP, trace TR. Carotid duplex 03/2023 with no stenosis.    She presents today for follow up.notes recent back issues limiting physical activity and GI issues limiting diet. In setting of this stress, has had a few more episodes of palpitations but none persistent. No chest pain, exertional dyspnea, edema.Reviewed NMR panel 03/31/24 (LDL particle  number 918, LDL 108, HDL 77, triglycerides 42, cholesterol 193, LDL size 21)  ROS: Please see the history of present illness.    All other systems reviewed and are negative.  Studies Reviewed: .           Risk Assessment/Calculations:             Physical Exam:   VS:  BP 102/78   Pulse 65   Ht 5' 9 (1.753 m)   Wt 132 lb 9.6 oz (60.1 kg)   SpO2 98%   BMI 19.58 kg/m    Wt Readings from Last 3 Encounters:  04/12/24 132 lb 9.6 oz (60.1 kg)  12/24/23 134 lb (60.8 kg)  12/10/23 135 lb (61.2 kg)    GEN: Well nourished, well developed in no acute distress NECK: No JVD; No carotid bruits CARDIAC: RRR, no murmurs, rubs, gallops RESPIRATORY:  Clear to auscultation without rales, wheezing or rhonchi  ABDOMEN: Soft, non-tender, non-distended EXTREMITIES:  No edema; No deformity   ASSESSMENT AND PLAN: .    Chest pain in adult - CTA 09/2022 coronary calcium  score of 0. No recurrent chest pain. No indication for further workup.   Aortic atherosclerosis - Prefers to avoid medications if possible and is hesitant about statins. Discussed maintaining LDL <100, ideally <70. 05/04/23 normal ApoB and Lp(a). 05/04/23 LDL 88. NMR 03/31/24 LDL 108, low risk small LDL particle size and particle number. Recent inactivity from back pain and limited diet from GI issues. Update NMR panel in 6 mos. Prefers to work on lifestyle changes. Given handout on Nexletol (bempedoic acid) and Zetia or review.  Palpitations - ZIO with 6% PAC burden, brief episodes of SVT which were asymptomatic. She prefers to avoid daily medications. Given baseline bradycardia, would avoid daily AV nodal blocking agent. Continue Metoprolol  tartrate 12.5mg -25mg  PRN for palpitations. Refill provided.  Back pain - limiting physical activity, refer to PT. Upcoming visit with ortho Dr. Duwayne.   Venous insufficiency - Bilateral LE with varicose veins and no appreciable edema. Continue  compression stockings, leg elevation. Follows with [facility].           Dispo: follow up in 6 months with NMR profile prior  Signed, Reche GORMAN Finder, NP

## 2024-04-12 NOTE — Patient Instructions (Addendum)
 Medication Instructions:  Continue your current medications.  *If you need a refill on your cardiac medications before your next appointment, please call your pharmacy*  Lab Work: Your physician recommends that you return for lab work in: 3 months for fasting NMR panel  If you have labs (blood work) drawn today and your tests are completely normal, you will receive your results only by: MyChart Message (if you have MyChart) OR A paper copy in the mail If you have any lab test that is abnormal or we need to change your treatment, we will call you to review the results.  Follow-Up: At [facility], you and your health needs are our priority.  As part of our continuing mission to provide you with exceptional heart care, our providers are all part of one team.  This team includes your primary Cardiologist (physician) and Advanced Practice Providers or APPs (Physician Assistants and Nurse Practitioners) who all work together to provide you with the care you need, when you need it.  Your next appointment:   6 month(s)  Provider:   Reche Finder, NP    We recommend signing up for the patient portal called MyChart.  Sign up information is provided on this After Visit Summary.  MyChart is used to connect with patients for Virtual Visits (Telemedicine).  Patients are able to view lab/test results, encounter notes, upcoming appointments, etc.  Non-urgent messages can be sent to your provider as well.   To learn more about what you can do with MyChart, go to [email or web address].   Other Instructions  Info attached on non-statin cholesterol meds.

## 2024-04-18 ENCOUNTER — Encounter (HOSPITAL_BASED_OUTPATIENT_CLINIC_OR_DEPARTMENT_OTHER): Payer: Self-pay | Admitting: Family

## 2024-04-19 ENCOUNTER — Encounter: Payer: Self-pay | Admitting: Hematology

## 2024-04-20 ENCOUNTER — Other Ambulatory Visit: Payer: Self-pay | Admitting: Sports Medicine

## 2024-04-20 ENCOUNTER — Other Ambulatory Visit: Payer: Self-pay

## 2024-04-20 DIAGNOSIS — D510 Vitamin B12 deficiency anemia due to intrinsic factor deficiency: Secondary | ICD-10-CM

## 2024-04-20 DIAGNOSIS — M545 Low back pain, unspecified: Secondary | ICD-10-CM

## 2024-04-22 ENCOUNTER — Inpatient Hospital Stay (HOSPITAL_BASED_OUTPATIENT_CLINIC_OR_DEPARTMENT_OTHER): Payer: Medicare Other | Admitting: Hematology

## 2024-04-22 ENCOUNTER — Inpatient Hospital Stay: Payer: Medicare Other | Attending: Hematology

## 2024-04-22 VITALS — BP 98/63 | HR 60 | Temp 97.7°F | Resp 20 | Wt 131.0 lb

## 2024-04-22 DIAGNOSIS — D508 Other iron deficiency anemias: Secondary | ICD-10-CM

## 2024-04-22 DIAGNOSIS — I700 Atherosclerosis of aorta: Secondary | ICD-10-CM | POA: Diagnosis not present

## 2024-04-22 DIAGNOSIS — I341 Nonrheumatic mitral (valve) prolapse: Secondary | ICD-10-CM | POA: Insufficient documentation

## 2024-04-22 DIAGNOSIS — D708 Other neutropenia: Secondary | ICD-10-CM | POA: Insufficient documentation

## 2024-04-22 DIAGNOSIS — D510 Vitamin B12 deficiency anemia due to intrinsic factor deficiency: Secondary | ICD-10-CM | POA: Diagnosis not present

## 2024-04-22 LAB — CMP (CANCER CENTER ONLY)
ALT: 11 U/L (ref 0–44)
AST: 23 U/L (ref 15–41)
Albumin: 4.7 g/dL (ref 3.5–5.0)
Alkaline Phosphatase: 45 U/L (ref 38–126)
Anion gap: 6 (ref 5–15)
BUN: 14 mg/dL (ref 8–23)
CO2: 27 mmol/L (ref 22–32)
Calcium: 9.5 mg/dL (ref 8.9–10.3)
Chloride: 107 mmol/L (ref 98–111)
Creatinine: 0.65 mg/dL (ref 0.44–1.00)
GFR, Estimated: >60 mL/min (ref >60)
Glucose, Bld: 91 mg/dL (ref 70–99)
Potassium: 4.5 mmol/L (ref 3.5–5.1)
Sodium: 140 mmol/L (ref 135–145)
Total Bilirubin: 0.6 mg/dL (ref 0.0–1.2)
Total Protein: 7.1 g/dL (ref 6.5–8.1)

## 2024-04-22 LAB — CBC WITH DIFFERENTIAL (CANCER CENTER ONLY)
Abs Immature Granulocytes: 0 K/uL (ref 0.00–0.07)
Basophils Absolute: 0 K/uL (ref 0.0–0.1)
Basophils Relative: 1 %
Eosinophils Absolute: 0 K/uL (ref 0.0–0.5)
Eosinophils Relative: 1 %
HCT: 41.1 % (ref 36.0–46.0)
Hemoglobin: 13.9 g/dL (ref 12.0–15.0)
Immature Granulocytes: 0 %
Lymphocytes Relative: 26 %
Lymphs Abs: 0.8 K/uL (ref 0.7–4.0)
MCH: 31.2 pg (ref 26.0–34.0)
MCHC: 33.8 g/dL (ref 30.0–36.0)
MCV: 92.4 fL (ref 80.0–100.0)
Monocytes Absolute: 0.3 K/uL (ref 0.1–1.0)
Monocytes Relative: 8 %
Neutro Abs: 1.9 K/uL (ref 1.7–7.7)
Neutrophils Relative %: 64 %
Platelet Count: 181 K/uL (ref 150–400)
RBC: 4.45 MIL/uL (ref 3.87–5.11)
RDW: 12.7 % (ref 11.5–15.5)
WBC Count: 3 K/uL — ABNORMAL LOW (ref 4.0–10.5)
nRBC: 0 % (ref 0.0–0.2)

## 2024-04-22 LAB — FERRITIN: Ferritin: 111 ng/mL (ref 11–307)

## 2024-04-22 LAB — IRON AND IRON BINDING CAPACITY (CC-WL,HP ONLY)
Iron: 98 ug/dL (ref 28–170)
Saturation Ratios: 29 % (ref 10.4–31.8)
TIBC: 343 ug/dL (ref 250–450)
UIBC: 245 ug/dL (ref 148–442)

## 2024-04-22 LAB — VITAMIN B12: Vitamin B-12: 3273 pg/mL — ABNORMAL HIGH (ref 180–914)

## 2024-04-23 ENCOUNTER — Encounter: Payer: Self-pay | Admitting: Hematology

## 2024-04-23 ENCOUNTER — Other Ambulatory Visit: Payer: Self-pay

## 2024-04-23 ENCOUNTER — Emergency Department (HOSPITAL_COMMUNITY)
Admission: EM | Admit: 2024-04-23 | Discharge: 2024-04-23 | Disposition: A | Discharge status: Home / Self Care | Attending: Emergency Medicine | Admitting: Emergency Medicine

## 2024-04-23 ENCOUNTER — Emergency Department (HOSPITAL_COMMUNITY)

## 2024-04-23 ENCOUNTER — Encounter (HOSPITAL_COMMUNITY): Payer: Self-pay

## 2024-04-23 DIAGNOSIS — E039 Hypothyroidism, unspecified: Secondary | ICD-10-CM | POA: Insufficient documentation

## 2024-04-23 DIAGNOSIS — R0789 Other chest pain: Secondary | ICD-10-CM | POA: Diagnosis not present

## 2024-04-23 DIAGNOSIS — D708 Other neutropenia: Secondary | ICD-10-CM | POA: Diagnosis not present

## 2024-04-23 DIAGNOSIS — R002 Palpitations: Secondary | ICD-10-CM | POA: Diagnosis present

## 2024-04-23 LAB — CBC WITH DIFFERENTIAL/PLATELET
Abs Immature Granulocytes: 0.01 K/uL (ref 0.00–0.07)
Basophils Absolute: 0 K/uL (ref 0.0–0.1)
Basophils Relative: 1 %
Eosinophils Absolute: 0 K/uL (ref 0.0–0.5)
Eosinophils Relative: 1 %
HCT: 40.7 % (ref 36.0–46.0)
Hemoglobin: 13.6 g/dL (ref 12.0–15.0)
Immature Granulocytes: 0 %
Lymphocytes Relative: 25 %
Lymphs Abs: 1 K/uL (ref 0.7–4.0)
MCH: 31.6 pg (ref 26.0–34.0)
MCHC: 33.4 g/dL (ref 30.0–36.0)
MCV: 94.4 fL (ref 80.0–100.0)
Monocytes Absolute: 0.3 K/uL (ref 0.1–1.0)
Monocytes Relative: 8 %
Neutro Abs: 2.7 K/uL (ref 1.7–7.7)
Neutrophils Relative %: 65 %
Platelets: 179 K/uL (ref 150–400)
RBC: 4.31 MIL/uL (ref 3.87–5.11)
RDW: 12.4 % (ref 11.5–15.5)
WBC: 4.1 K/uL (ref 4.0–10.5)
nRBC: 0 % (ref 0.0–0.2)

## 2024-04-23 LAB — BASIC METABOLIC PANEL WITH GFR
Anion gap: 10 (ref 5–15)
BUN: 18 mg/dL (ref 8–23)
CO2: 23 mmol/L (ref 22–32)
Calcium: 9.2 mg/dL (ref 8.9–10.3)
Chloride: 107 mmol/L (ref 98–111)
Creatinine, Ser: 0.67 mg/dL (ref 0.44–1.00)
GFR, Estimated: >60 mL/min (ref >60)
Glucose, Bld: 73 mg/dL (ref 70–99)
Potassium: 4.4 mmol/L (ref 3.5–5.1)
Sodium: 140 mmol/L (ref 135–145)

## 2024-04-23 LAB — TROPONIN I (HIGH SENSITIVITY)
Troponin I (High Sensitivity): 3 ng/L (ref <18)
Troponin I (High Sensitivity): 3 ng/L (ref <18)

## 2024-04-23 NOTE — Discharge Instructions (Signed)
 Follow-up with your doctors.  Return for any new or worse symptoms.  Today's workup labs and chest x-ray without any acute findings.

## 2024-04-23 NOTE — ED Provider Notes (Addendum)
 [place] EMERGENCY DEPARTMENT AT [facility] Provider Note   CSN: [id number] Arrival date & time: 04/23/24  1507     Patient presents with: Dizziness and Palpitations   Megan [name] is a 68 y.o. female.   Patient sent in from urgent care.  Did have negative flu COVID test there.  Sent for concerns for cardiac workup patient with funny feeling in the left anterior chest may be palpitations maybe not.  Apparently blood pressure was a little marginal where it was in the low 90s.  Patient states that she does not have a history of high blood pressure blood pressure here is 123/78.  Patient has had some gut issues and is followed by a holistic medicine MD has had some gut cleanses.  But she says that starting to improve.  Patient was seen by hematology yesterday had labs done CMP but CBC were both normal.  Patient's EKG here just showed sinus arrhythmia.  We do have her on cardiac monitor.  Chest x-ray hyperinflation of lungs no acute cardiopulmonary disease.  An initial troponin was 3.  Will need delta troponin.  Past medical history significant for mitral valve prolapse orthostatic hypotension congenital aneurysm of cardiac wall history of heart murmurs mitral valve prolapse.  Hypothyroidism on meds.  Patient is never used any tobacco products.  Patient followed by hematology for ferritin concerns.  Patient also recently was on medications for increased level of Candida in the stool.  Patient without any significant nausea vomiting or diarrhea.  They did list dizziness but patient really has not had any dizziness or room spinning.  Maybe a little bit of lightheadedness.       Prior to Admission medications   Medication Sig Start Date End Date Taking? Authorizing Provider  5-Hydroxytryptophan (5-HTP) 100 MG CAPS Take 100 mg by mouth daily.     [provider]  albuterol  (VENTOLIN  HFA) 108 (90 Base) MCG/ACT inhaler Inhale 1 puff into the lungs daily as needed for wheezing or  shortness of breath. 05/24/20   [provider]  AMBULATORY NON FORMULARY MEDICATION 3 (three) times daily. Medication Name: Amino acids  double wood    [provider]  ARMOUR THYROID  30 MG tablet Take 1 tablet (30 mg total) by mouth daily. 04/09/22   Trixie File, MD  Ascorbic Acid (VITAMIN C) POWD Take 2,500 mg by mouth daily.    [provider]  Cholecalciferol (VITAMIN D3) 10000 units capsule Take 10,000 Units by mouth daily.     [provider]  Cyanocobalamin  (VITAMIN B-12 ER PO) Take 5,000 mcg by mouth daily.     [provider]  Cyanocobalamin  (VITAMIN B-12 IJ) Inject 5,000 mcg as directed once a week. Patient taking differently: Inject 5,000 mcg as directed as directed. Every other week    [provider]  cycloSPORINE (RESTASIS) 0.05 % ophthalmic emulsion Place 1 drop into both eyes 2 (two) times daily.    [provider]  estradiol  (ESTRACE ) 0.1 MG/GM vaginal cream Place 1 Applicatorful vaginally 3 (three) times a week.    [provider]  fish oil-omega-3 fatty acids 1000 MG capsule Take 1 g by mouth daily. 08/09/08   [provider]  MAGNESIUM CITRATE PO Take 1,000 mg by mouth daily.    [provider]  Menaquinone-7 (VITAMIN K2) 100 MCG CAPS Take 1 capsule by mouth daily. 08/25/20   [provider]  metoprolol  tartrate (LOPRESSOR ) 25 MG tablet Take half tablet (12.5mg ) as needed for palpitations  up to twice per day. 04/12/24   Walker, Caitlin S, NP  Multiple Vitamin (MULTIVITAMIN) capsule Take 1 capsule by mouth daily.    [provider]  NON FORMULARY Take 1,000 mcg by mouth daily. Methyl Folate chewable tablet    [provider]  NONFORMULARY OR COMPOUNDED ITEM Exosome 1 trillion, I injection monthy    [provider]  NONFORMULARY OR COMPOUNDED ITEM Take 0.3125 mg by mouth daily. Estradiol  troche    [provider]  OVER THE COUNTER MEDICATION Take  1 capsule by mouth daily.    [provider]  progesterone  (PROMETRIUM ) 100 MG capsule Take 1.5 capsules by mouth daily.    [provider]  TESTOSTERONE  UNDECANOATE PO Take 1 mg by mouth daily.    [provider]    Allergies: Augmentin [amoxicillin -pot clavulanate], Calcium , Clindamycin/lincomycin, Iron, Moxifloxacin, Propofol , and Sodium citrate    Review of Systems  Constitutional:  Negative for chills and fever.  HENT:  Negative for ear pain and sore throat.   Eyes:  Negative for pain and visual disturbance.  Respiratory:  Negative for cough and shortness of breath.   Cardiovascular:  Positive for chest pain and palpitations.  Gastrointestinal:  Negative for abdominal pain and vomiting.  Genitourinary:  Negative for dysuria and hematuria.  Musculoskeletal:  Negative for arthralgias and back pain.  Skin:  Negative for color change and rash.  Neurological:  Negative for seizures and syncope.  All other systems reviewed and are negative.   Updated Vital Signs BP 112/64   Pulse 75   Temp 98 F (36.7 C)   Resp 14   Ht 1.753 m (5' 9)   Wt 59.9 kg   SpO2 100%   BMI 19.49 kg/m   Physical Exam Vitals and nursing note reviewed.  Constitutional:      General: She is not in acute distress.    Appearance: Normal appearance. She is well-developed.  HENT:     Head: Normocephalic and atraumatic.  Eyes:     Extraocular Movements: Extraocular movements intact.     Conjunctiva/sclera: Conjunctivae normal.     Pupils: Pupils are equal, round, and reactive to light.  Cardiovascular:     Rate and Rhythm: Normal rate and regular rhythm.     Heart sounds: No murmur heard. Pulmonary:     Effort: Pulmonary effort is normal. No respiratory distress.     Breath sounds: Normal breath sounds.  Abdominal:     General: There is no distension.     Palpations: Abdomen is soft.     Tenderness: There is no abdominal tenderness.  Musculoskeletal:        General: No  swelling.     Cervical back: Normal range of motion and neck supple.  Skin:    General: Skin is warm and dry.     Capillary Refill: Capillary refill takes less than 2 seconds.  Neurological:     General: No focal deficit present.     Mental Status: She is alert and oriented to person, place, and time.     Cranial Nerves: No cranial nerve deficit.     Sensory: No sensory deficit.     Motor: No weakness.  Psychiatric:        Mood and Affect: Mood normal.     (all labs ordered are listed, but only abnormal results are displayed) Labs Reviewed  CBC WITH DIFFERENTIAL/PLATELET  BASIC METABOLIC PANEL WITH GFR  TROPONIN I (HIGH SENSITIVITY)  TROPONIN I (HIGH SENSITIVITY)  EKG: None  Radiology: DG Chest 2 View Result Date: 04/23/2024 CLINICAL DATA:  Palpitations. EXAM: DG CHEST 2V COMPARISON:  A [id number]. FINDINGS: The heart size and mediastinal contours are within normal limits. There is hyperinflation of the lungs. No consolidation, effusion, or pneumothorax. No acute osseous abnormality. IMPRESSION: Hyperinflation of the lungs with no active cardiopulmonary disease. Electronically Signed   By: Leita Birmingham M.D.   On: 04/23/2024 16:33     Procedures   Medications Ordered in the ED - No data to display                                  Medical Decision Making Amount and/or Complexity of Data Reviewed Labs: ordered. Radiology: ordered.   Patient's vital signs reassuring.  Cardiac monitor here sinus rhythm.  EKG with some sinus arrhythmia.  Will get orthostatic blood pressures.  Chest x-ray without acute findings initial troponin normal at 3.  Will repeat her CBC and basic metabolic panel and get a delta troponin.  No she had labs yesterday but that just to be sure no significant change in case she ends up needing admission.  Do not expect admission.  Blood pressures here with her and better in the 120s very reassuring.  Patient's delta troponins both very normal at 3.  CBC  completely normal hemoglobin 13.6 white count 4.1 platelets 179.  Basic metabolic panel electrolytes including renal function are all normal.  And two-view chest as mentioned above.  I just reminded nurse to do the orthostatic blood pressures.  If they are okay patient stable for discharge home.  Patient's orthostatic blood pressures have without significant change.  Up she did go into the upper 90s with her systolic.  But her maps were fine.  Patient without any significant symptoms.  Stable for discharge home.  Final diagnoses:  Palpitations  Atypical chest pain    ED Discharge Orders     None          Geraldene Hamilton, MD 04/23/24 1738    Geraldene Hamilton, MD 04/23/24 1944    Geraldene Hamilton, MD 04/23/24 [phone]

## 2024-04-23 NOTE — ED Notes (Signed)
 [facility]CMD called.

## 2024-04-23 NOTE — ED Triage Notes (Signed)
 Pt c.o feeling dizzy, palpitations and clammy for the past 5 days. Pt is seeing a functional medicine MD who started her on a specific bowel regimen with a low carb diet and started having lots of bowel movements. Since then she has been feeling sick. Pt seen at [facility] today with negative flu/covid tests and sent here for cardiac workup. Pt seen with hematology yesterday and had labs drawn, Cmp and CBC were both WNL. Pt denies chest pain

## 2024-04-27 ENCOUNTER — Ambulatory Visit
Admission: RE | Admit: 2024-04-27 | Discharge: 2024-04-27 | Disposition: A | Discharge status: Home / Self Care | Source: Ambulatory Visit | Attending: Sports Medicine | Admitting: Sports Medicine

## 2024-04-27 DIAGNOSIS — M545 Low back pain, unspecified: Secondary | ICD-10-CM

## 2024-04-28 ENCOUNTER — Encounter: Payer: Self-pay | Admitting: Hematology

## 2024-04-28 NOTE — Progress Notes (Signed)
 HEMATOLOGY/ONCOLOGY CLINIC NOTE  Date of Service: .04/22/2024  Patient Care Team: Dwight Trula SQUIBB, MD as PCP - General (Internal Medicine) Pietro Redell RAMAN, MD as PCP - Cardiology (Cardiology) Freddie Lynwood HERO, MD as Consulting Physician (Oncology) Tobie Tonita POUR, DO as Consulting Physician (Neurology) Zackary Megan ORN, MD as Consulting Physician (Family Medicine) Marquette Ozell BIRCH, DO as Consulting Physician (Sports Medicine) Onesimo Emaline Brink, MD as Consulting Physician (Hematology) Patel, Donika K, DO as Consulting Physician (Neurology)  CHIEF COMPLAINTS/PURPOSE OF CONSULTATION:  For continued evaluation and management of pernicious anemia  HISTORY OF PRESENTING ILLNESS:   Megan [name] is a wonderful 68 y.o. female who has been referred to us  by Dr. Lynwood Freddie for evaluation and management of Chronic idiopathic neutropenia. The pt reports that she is doing well overall.   The pt reports a history of mold exposure, chronic mild neutropenia, and neuropathy. She denies frequent or abnormal infections.   The pt notes that she was first diagnosed with neuropathy in 2010, and has seen five neurologists. She endorses weakness and tingling in feet into mid-calf which has improved over the past 10 years. She has current weakness and concern for balance which limits her activity. She adds that the bottom of her feet occasionally become dark purple. She has had doppler studies and notes these ruled out vascular problems. She has seen holistic health practitioners in [place] in the past. She had a four out of five positives on western blot which revealed concerns for Lyme disease but apparently was not conclusive. She previously lived in [place]. She denies ever having a bulls eye rash.  The pt notes some difficulty with writing, which has not been progressive.   The pt has recently been worked up for elevated estrogen levels and a previous cyst, and was previously  taking Biotin. She stopped taking Boitin and has seen normalized estrogen levels. She notes that repeat MRI revealed the absence of the previously seen cyst  The pt also reports a history of polyclonal gammopathy.   She notes that her Ferritin was seen to be at 26 about 1.5 years ago. She has been a vegan and then began eating some meat protein. She also took PO iron replacement which was constipating. She also has taken Blood builder. Her Ferritin has recently decreased to a 10. Pt denies blood in the stools nor black stools. She had a colonoscopy about 1.5 years ago and notes this was unremarkable. She denies unexpected weight loss. She denies using acid suppressants. She endorses having pernicious anemia. Pt takes 5000mcg Vitamin B12 daily. She is also taking 10k units Vitamin D3 five days a week.   The pt notes that she has osteoporosis, with L1-L4 with score of -3.5. She has been disinclined to pursue Prolia injections. She has been consuming almond milk and avoids dairy products. She took Fosamax three years ago.  Most recent lab results (08/22/18) of CBC is as follows: all values are WNL except for WBC at 3.5k.  On review of systems, pt reports neuropathy with weakness in feet, stable energy levels, constipation, and denies blood in the stools, black stools, abdominal pains, and any other symptoms.   On PMHx the pt reports neuropathy, pernicious anemia, osteoporosis.  INTERVAL HISTORY:   Megan [name] is a 68 y.o. female who is here for continued evaluation and management of her pernicious anemia with iron deficiency and chronic idiopathic neutropenia/leukopenia. She notes that her integrative medicine doctor is treating her for  Candida infection of the gut. She also has been placed on triphala supplement that is causing her diarrhea, Labs done today were discussed in detail with her, Notes some fatigue and some hair loss. No other acute new symptoms. No significant infection  issues  MEDICAL HISTORY:  Past Medical History:  Diagnosis Date   Allergic rhinitis    Allergy    Aneurysm of cardiac wall, congenital    a.  septal aneurysm extending into the RVOT (by cardiac MRI in 08/2013)   Anxiety    Aortic atherosclerosis (HCC)    Arthritis    Basal cell carcinoma    Depression    Heart murmur    Hypothyroidism    on meds   Leukopenia    idiopathic   Lyme disease    Mitral valve prolapse    MVP (mitral valve prolapse)    Orthostatic hypotension    Osteoporosis    Pancreatic cyst    Peripheral neuropathy 06/09/2012   Pernicious anemia    Plantar fasciitis    Pneumothorax    TMJ (dislocation of temporomandibular joint)     SURGICAL HISTORY: Past Surgical History:  Procedure Laterality Date   COLONOSCOPY  2019   JMP-   COLONOSCOPY  05/2023   COLONOSCOPY WITH PROPOFOL  N/A 02/22/2013   Procedure: COLONOSCOPY WITH PROPOFOL ;  Surgeon: Gladis MARLA Louder, MD;  Location: [facility];  Service: Endoscopy;  Laterality: N/A;   HYSTEROSCOPY WITH D & C N/A 12/24/2023   Procedure: DILATATION AND CURETTAGE /HYSTEROSCOPY;  Surgeon: Mat Browning, MD;  Location: [facility];  Service: Gynecology;  Laterality: N/A;  NO MYOSURE   MOUTH SURGERY  01/2012   bone graft in mouth   NASAL SINUS SURGERY Right 06/12/2014   Procedure: RIGHT ENDOSCOPIC MAXILLARY ANTROSTOMY;  Surgeon: Ana LELON Moccasin, MD;  Location: [facility];  Service: ENT;  Laterality: Right;   ROOT CANAL  02/27/2012   TONSILLECTOMY  1962   WISDOM TOOTH EXTRACTION      SOCIAL HISTORY: Social History   Socioeconomic History   Marital status: Divorced    Spouse name: Not on file   Number of children: 0   Years of education: Not on file   Highest education level: Not on file  Occupational History    Comment: Career and life coach  Tobacco Use   Smoking status: Never   Smokeless tobacco: Never  Vaping Use   Vaping status: Never Used  Substance and Sexual Activity   Alcohol use: No     Alcohol/week: 0.0 standard drinks of alcohol   Drug use: No   Sexual activity: Not Currently    Birth control/protection: Post-menopausal  Other Topics Concern   Not on file  Social History Narrative   Patient is single and lives alone.   Patient is self-employed, career and life coaching.   Patient drinks two to four cups of caffeine daily.   Right handed    Social Drivers of Health   Financial Resource Strain: Not on file  Food Insecurity: No Food Insecurity (09/25/2023)   Hunger Vital Sign    Worried About Running Out of Food in the Last Year: Never true    Ran Out of Food in the Last Year: Never true  Transportation Needs: No Transportation Needs (09/25/2023)   PRAPARE - Administrator, Civil Service (Medical): No    Lack of Transportation (Non-Medical): No  Physical Activity: Not on file  Stress: Not on file  Social Connections: Not on file  Intimate Partner Violence: Not At Risk (09/25/2023)   Humiliation, Afraid, Rape, and Kick questionnaire    Fear of Current or Ex-Partner: No    Emotionally Abused: No    Physically Abused: No    Sexually Abused: No    FAMILY HISTORY: Family History  Problem Relation Age of Onset   Angina Mother    Hypertension Mother    Lung cancer Mother 48       again at 75/79-smoker   Colon cancer Mother 48   Colon polyps Mother    Hyperlipidemia Father    Heart failure Father    Prostate cancer Father 79   Kidney failure Father    Hypertension Father    Colon polyps Father    Breast cancer Paternal Grandmother    Diabetes Paternal Grandmother    Lung cancer Paternal Grandfather    Esophageal cancer Neg Hx    Liver cancer Neg Hx    Pancreatic cancer Neg Hx    Rectal cancer Neg Hx    Stomach cancer Neg Hx    Ovarian cancer Neg Hx    Endometrial cancer Neg Hx     ALLERGIES:  is allergic to augmentin [amoxicillin -pot clavulanate], calcium , clindamycin/lincomycin, iron, moxifloxacin, propofol , and sodium  citrate.  MEDICATIONS:  Current Outpatient Medications  Medication Sig Dispense Refill   5-Hydroxytryptophan (5-HTP) 100 MG CAPS Take 100 mg by mouth daily.      albuterol  (VENTOLIN  HFA) 108 (90 Base) MCG/ACT inhaler Inhale 1 puff into the lungs daily as needed for wheezing or shortness of breath.     AMBULATORY NON FORMULARY MEDICATION 3 (three) times daily. Medication Name: Amino acids  double wood     ARMOUR THYROID  30 MG tablet Take 1 tablet (30 mg total) by mouth daily. 90 tablet 3   Ascorbic Acid (VITAMIN C) POWD Take 2,500 mg by mouth daily.     Cholecalciferol (VITAMIN D3) 10000 units capsule Take 10,000 Units by mouth daily.      Cyanocobalamin  (VITAMIN B-12 ER PO) Take 5,000 mcg by mouth daily.      Cyanocobalamin  (VITAMIN B-12 IJ) Inject 5,000 mcg as directed once a week. (Patient taking differently: Inject 5,000 mcg as directed as directed. Every other week)     cycloSPORINE (RESTASIS) 0.05 % ophthalmic emulsion Place 1 drop into both eyes 2 (two) times daily.     estradiol  (ESTRACE ) 0.1 MG/GM vaginal cream Place 1 Applicatorful vaginally 3 (three) times a week.     fish oil-omega-3 fatty acids 1000 MG capsule Take 1 g by mouth daily.     MAGNESIUM CITRATE PO Take 1,000 mg by mouth daily.     Menaquinone-7 (VITAMIN K2) 100 MCG CAPS Take 1 capsule by mouth daily.     metoprolol  tartrate (LOPRESSOR ) 25 MG tablet Take half tablet (12.5mg ) as needed for palpitations up to twice per day. 30 tablet 1   Multiple Vitamin (MULTIVITAMIN) capsule Take 1 capsule by mouth daily.     NON FORMULARY Take 1,000 mcg by mouth daily. Methyl Folate chewable tablet     NONFORMULARY OR COMPOUNDED ITEM Exosome 1 trillion, I injection monthy     NONFORMULARY OR COMPOUNDED ITEM Take 0.3125 mg by mouth daily. Estradiol  troche     OVER THE COUNTER MEDICATION Take 1 capsule by mouth daily.     progesterone  (PROMETRIUM ) 100 MG capsule Take 1.5 capsules by mouth daily.     TESTOSTERONE  UNDECANOATE PO Take 1 mg  by mouth daily.     No current facility-administered  medications for this visit.    REVIEW OF SYSTEMS:   .10 Point review of Systems was done is negative except as noted above.   PHYSICAL EXAMINATION:  . Vitals:   04/22/24 0920  BP: 98/63  Pulse: 60  Resp: 20  Temp: 97.7 F (36.5 C)  SpO2: 100%   Filed Weights   04/22/24 0920  Weight: 131 lb (59.4 kg)   .Body mass index is 19.35 kg/m. [name] GENERAL:alert, in no acute distress and comfortable SKIN: no acute rashes, no significant lesions EYES: conjunctiva are pink and non-injected, sclera anicteric OROPHARYNX: MMM, no exudates, no oropharyngeal erythema or ulceration NECK: supple, no JVD LYMPH:  no palpable lymphadenopathy in the cervical, axillary or inguinal regions LUNGS: clear to auscultation b/l with normal respiratory effort HEART: regular rate & rhythm ABDOMEN:  normoactive bowel sounds , non tender, not distended.  No palpable hepatosplenomegaly Extremity: no pedal edema PSYCH: alert & oriented x 3 with fluent speech NEURO: no focal motor/sensory deficits   LABORATORY DATA:  I have reviewed the data as listed  .    Latest Ref Rng & Units 04/23/2024    5:25 PM 04/22/2024    8:29 AM 12/24/2023   11:40 AM  CBC  WBC 4.0 - 10.5 K/uL 4.1  3.0  4.2   Hemoglobin 12.0 - 15.0 g/dL 86.3  86.0  85.6   Hematocrit 36.0 - 46.0 % 40.7  41.1  43.8   Platelets 150 - 400 K/uL 179  181  163    ANC 1900 .    Latest Ref Rng & Units 04/23/2024    5:25 PM 04/22/2024    8:29 AM 12/24/2023   11:40 AM  CMP  Glucose 70 - 99 mg/dL 73  91  72   BUN 8 - 23 mg/dL 18  14  15    Creatinine 0.44 - 1.00 mg/dL 9.32  9.34  9.27   Sodium 135 - 145 mmol/L 140  140  141   Potassium 3.5 - 5.1 mmol/L 4.4  4.5  3.4   Chloride 98 - 111 mmol/L 107  107  108   CO2 22 - 32 mmol/L 23  27  23    Calcium  8.9 - 10.3 mg/dL 9.2  9.5  9.3   Total Protein 6.5 - 8.1 g/dL  7.1    Total Bilirubin 0.0 - 1.2 mg/dL  0.6    Alkaline Phos 38 - 126 U/L  45     AST 15 - 41 U/L  23    ALT 0 - 44 U/L  11     . Lab Results  Component Value Date   IRON 98 04/22/2024   TIBC 343 04/22/2024   IRONPCTSAT 29 04/22/2024   (Iron and TIBC)  Lab Results  Component Value Date   FERRITIN 111 04/22/2024   B12 -- 3273   RADIOGRAPHIC STUDIES: I have personally reviewed the radiological images as listed and agreed with the findings in the report.  ASSESSMENT & PLAN:   68 y.o. female with  1. Chronic idiopathic leucopenia. WBC count today low normal @ 2.8k ANC 1800 ( no neutropenia) 2. Pernicious anemia -12/30/17 Antiparietal cell antibodies which were high at 105.9  PLAN:  -Patient's labs from 04/22/2024 were discussed with her in details. CBC shows normal hemoglobin of 13.9, normal platelets of 181k, mild leukopenia intermittently which is today at [facility] but with normal neutrophil counts of 1900.  And normal differential. CMP within normal limits B12 in the 3000's.  B12 replacement  as per integrative medicine doctor Ferritin level of 111 with an iron saturation of 29%. Patient's iron levels are at goal's and there is no indication for IV iron replacement at this time.  Recommended patient have a balanced diet Could try oral iron but would recommend taking this with orange juice to try to see if she can help improve iron absorption.  FOLLOW UP: Return to clinic with Dr. Onesimo with labs in 6 months  The total time spent in the appointment was 20 minutes*.  All of the patient's questions were answered with apparent satisfaction. The patient knows to call the clinic with any problems, questions or concerns.   Emaline Onesimo MD MS AAHIVMS [facility] Hematology/Oncology Physician [facility]  .*Total Encounter Time as defined by the Centers for Medicare and Medicaid Services includes, in addition to the face-to-face time of a patient visit (documented in the note above) non-face-to-face time: obtaining and reviewing outside history, ordering  and reviewing medications, tests or procedures, care coordination (communications with other health care professionals or caregivers) and documentation in the medical record.

## 2024-05-03 ENCOUNTER — Encounter: Payer: Self-pay | Admitting: Internal Medicine

## 2024-05-03 ENCOUNTER — Other Ambulatory Visit (INDEPENDENT_AMBULATORY_CARE_PROVIDER_SITE_OTHER)

## 2024-05-03 ENCOUNTER — Ambulatory Visit: Admitting: Internal Medicine

## 2024-05-03 VITALS — BP 90/60 | HR 43 | Ht 69.0 in | Wt 132.2 lb

## 2024-05-03 DIAGNOSIS — K5909 Other constipation: Secondary | ICD-10-CM

## 2024-05-03 DIAGNOSIS — K5901 Slow transit constipation: Secondary | ICD-10-CM

## 2024-05-03 DIAGNOSIS — R109 Unspecified abdominal pain: Secondary | ICD-10-CM

## 2024-05-03 DIAGNOSIS — Z800 Family history of malignant neoplasm of digestive organs: Secondary | ICD-10-CM

## 2024-05-03 DIAGNOSIS — D510 Vitamin B12 deficiency anemia due to intrinsic factor deficiency: Secondary | ICD-10-CM | POA: Diagnosis not present

## 2024-05-03 DIAGNOSIS — R1030 Lower abdominal pain, unspecified: Secondary | ICD-10-CM

## 2024-05-03 DIAGNOSIS — K638219 Small intestinal bacterial overgrowth, unspecified: Secondary | ICD-10-CM

## 2024-05-03 LAB — MAGNESIUM: Magnesium: 2.1 mg/dL (ref 1.5–2.5)

## 2024-05-03 MED ORDER — PRUCALOPRIDE SUCCINATE 1 MG PO TABS: 1.0000 | ORAL_TABLET | Freq: Every day | ORAL | 1 refills | Status: DC

## 2024-05-03 NOTE — Progress Notes (Signed)
 Subjective:    Patient ID: Megan [name], female    DOB: [birth year], 68 y.o.   MRN: [id number]  HPI Megan [name] is a 68 year old female with chronic constipation who presents for follow-up.  She manages her chronic constipation with magnesium but has experienced episodes of diarrhea since July, accompanied by abdominal pain and pressure below the umbilicus. She describes a sensation of something 'dropping' and causing discomfort throughout the day. Triphala, an herbal supplement, initially improved her symptoms but later led to excessive bowel movements, resulting in weakness and lightheadedness.  In May, she underwent a hysteroscopy with fentanyl , Versed , and propofol . She questions whether these medications could have affected her bowel function. While waiting for this follow-up, she consulted an integrative medicine practitioner who diagnosed her with Candida overgrowth based on blood and stool tests. She was advised to start antifungal treatment and a low-carb diet but opted for herbal remedies instead. This regimen initially improved her symptoms, but she later experienced significant weakness and low blood pressure, leading to an ER visit where no acute issues were found.  She has a history of pernicious anemia and B12 deficiency, for which she takes B12 supplements. Her recent B12 level was over 3000. She also reports calf cramping and has not had her serum magnesium levels checked despite taking magnesium supplements regularly.  She mentions a sensation of sitting on a heating pad, which she suspects might be related to her rectum due to her bowel issues. She recently had a back MRI and is awaiting results. She has not been able to exercise for two weeks due to her symptoms.  Her past medical history includes being told she has a long colon. She recalls a previous evaluation at [facility] where surgical intervention was considered but not pursued due to her age at the  time.  She purchased a probiotic recommended by [facility] called Seed-1 but she has not started this yet.  Regarding the yeast which was prevalent in her [facility] GI effects stool panel showed need for microbiome support and dysbiosis and need for antimicrobial support with yeast.  She was told she might need 6 months of fluconazole  therapy.  Review of Systems As per HPI, otherwise negative  Current Medications, Allergies, Past Medical History, Past Surgical History, Family History and Social History were reviewed in [name] record.    Objective:   Physical Exam BP 90/60   Pulse (!) 43   Ht 5' 9 (1.753 m)   Wt 132 lb 3.2 oz (60 kg)   BMI 19.52 kg/m  Gen: awake, alert, NAD HEENT: anicteric  Neuro: nonfocal  LABS Candida IgG: Elevated Candida IgA: Elevated Candida IgM: Normal Fecal calprotectin: Negative Ferritin: 111  DIAGNOSTIC Upper endoscopy: Normal (October 2024) Colonoscopy: Normal (October 2024)  DIAGNOSTIC EGD: 2 cm hiatal hernia, Altamirano grade IV gastroesophageal flap, normal mucosa, normal duodenum, negative tongue brushing for yeast (06/02/2023) Colonoscopy: Normal colonoscopy, moderately tortuous left colon (06/02/2023)   PATHOLOGY Gastric biopsies: Normal gastric cardiac, fundus, body, and antral biopsies; no H. pylori, intestinal metaplasia, dysplasia, or malignancy (06/02/2023)    Assessment & Plan:  68 year old female with a history of chronic constipation/colonic inertia, family history of colon cancer in her mother, family history of colon cancer in her father, pernicious anemia, hypothyroidism, small pancreatic cyst, osteoporosis, here for follow-up.   Chronic constipation with colonic dysmotility and anatomic abnormality Chronic constipation likely due to redundant colonic anatomy and hypomotility.  Microbiome imbalance  likely contributing.   - Initiate Motegrity  1 mg daily, adjust based on  tolerance and response. - Continue magnesium citrate as needed, reduce if Motegrity  effective. - Check serum magnesium for hypermagnesemia. - Could retry triphala herbal supplement occasionally for additional bowel support if needed. - Avoid new probiotics until Motegrity  effect assessed.  Pernicious anemia with B12 deficiency Pernicious anemia managed with B12 supplementation.  - Continue B12 supplementation  Rectal discomfort and spasm Rectal discomfort and spasm possibly related to chronic constipation and colonic dysmotility, with symptoms of rectal spasm. - Motegrity  as above  Family history of colon cancer - 5-year screening interval recommended  Possible intestinal dysbiosis (microbiome imbalance) Possible intestinal dysbiosis with elevated Candida levels. Emphasized addressing motility issues to manage dysbiosis. Discussed risks of long-term antifungal treatment without clear infection evidence. - Focus on improving gut motility with Motegrity  to address dysbiosis. - Avoid long-term antifungal treatment without clear infection evidence.  45 minutes total spent today including patient facing time, coordination of care, reviewing medical history/procedures/pertinent radiology studies, and documentation of the encounter.

## 2024-05-03 NOTE — Patient Instructions (Signed)
 Your provider has requested that you go to the basement level for lab work before leaving today. Press B on the elevator. The lab is located at the first door on the left as you exit the elevator.  We have sent the following medications to your pharmacy for you to pick up at your convenience: Motegrity .   We will contact you when the December schedule becomes available.  _______________________________________________________  If your blood pressure at your visit was 140/90 or greater, please contact your primary care physician to follow up on this.  _______________________________________________________  If you are age 69 or older, your body mass index should be between 23-30. Your Body mass index is 19.52 kg/m. If this is out of the aforementioned range listed, please consider follow up with your Primary Care Provider.  If you are age 69 or younger, your body mass index should be between 19-25. Your Body mass index is 19.52 kg/m. If this is out of the aformentioned range listed, please consider follow up with your Primary Care Provider.   ________________________________________________________  The [facility] providers would like to encourage you to use MYCHART to communicate with providers for non-urgent requests or questions.  Due to long hold times on the telephone, sending your provider a message by [facility] may be a faster and more efficient way to get a response.  Please allow 48 business hours for a response.  Please remember that this is for non-urgent requests.  _______________________________________________________  [facility] is using a team-based approach to care.  Your team is made up of your doctor and two to three APPS. Our APPS (Nurse Practitioners and Physician Assistants) work with your physician to ensure care continuity for you. They are fully qualified to address your health concerns and develop a treatment plan. They communicate directly with your  gastroenterologist to care for you. Seeing the Advanced Practice Practitioners on your physician's team can help you by facilitating care more promptly, often allowing for earlier appointments, access to diagnostic testing, procedures, and other specialty referrals.

## 2024-05-05 ENCOUNTER — Ambulatory Visit: Payer: Self-pay | Admitting: Internal Medicine

## 2024-05-06 ENCOUNTER — Ambulatory Visit: Admitting: Nurse Practitioner

## 2024-05-09 ENCOUNTER — Encounter: Payer: Self-pay | Admitting: Family Medicine

## 2024-05-23 ENCOUNTER — Ambulatory Visit: Admitting: Podiatry

## 2024-05-24 ENCOUNTER — Ambulatory Visit (INDEPENDENT_AMBULATORY_CARE_PROVIDER_SITE_OTHER): Admitting: Otolaryngology

## 2024-05-24 ENCOUNTER — Telehealth: Payer: Self-pay | Admitting: Hematology

## 2024-05-24 ENCOUNTER — Encounter (INDEPENDENT_AMBULATORY_CARE_PROVIDER_SITE_OTHER): Payer: Self-pay | Admitting: Otolaryngology

## 2024-05-24 VITALS — BP 100/62 | HR 75 | Temp 97.4°F

## 2024-05-24 DIAGNOSIS — R0981 Nasal congestion: Secondary | ICD-10-CM

## 2024-05-24 DIAGNOSIS — H6983 Other specified disorders of Eustachian tube, bilateral: Secondary | ICD-10-CM | POA: Diagnosis not present

## 2024-05-24 DIAGNOSIS — J310 Chronic rhinitis: Secondary | ICD-10-CM | POA: Diagnosis not present

## 2024-05-24 NOTE — Telephone Encounter (Signed)
 Megan [name] has been scheduled for her 6 month follow up appointment per a staff message received on 9/29. I LVM asking her to return my call if she needs to re-schedule.

## 2024-05-24 NOTE — Progress Notes (Signed)
 [facility]  Urgent Care Provider Note   Provider at bedside: 9:15 AM  History obtained from the: Patient  HISTORY   PATIENT ID: Megan [name] is a 68 y.o. female.  CHIEF COMPLAINT: Chief Complaint  Patient presents with  . Congestion    States her dog jump on her yesterday and was having some pain on that side, went to the doctor and they told her she may have a dislocated rib; states she is now feeling sick and congested, states its hard to breathe sometimes      ALLERGIES: Allergies[1]   PAST MEDICAL HISTORY: PMH - Anemia Aneurysm of right ventricle of heart Asthma (CMD) Basal cell carcinoma Depression Hypogammaglobulinemia (CMD) Hypothyroidism Hypotrichosis Leukopenia Mitral valve prolapse Osteoporosis Seborrheic dermatitis Seborrheic dermatitis  CURRENT MEDICATIONS: Current Medications[2]  ROS  All other symptoms are reviewed and are negative except those listed in HPI   HPI   Megan [name] is a 68 y.o. female  presents to Urgent care   History of Present Illness The patient with reactive small airway disease presents with chest pain and congestion.  Chest Pain - Yesterday, her dog jumped on her chest causing significant pain and a popping sound. - Osteopathic sports medicine specialist diagnosed muscle strain related to lower floating ribs and oblique muscles. - Advised to avoid gym for 1-2 weeks and seek help if breathing difficulties or oxygen level <90%. - Difficulty using incentive spirometer today, felt trapped air sensation which later subsided. - Night sweats for past few days, no measured temperature.  Congestion - Last night, developed congestion with postnasal drip. - Used Xlear nasal spray and DayQuil, felt slightly better this morning but still unwell. - Difficulty climbing stairs, believes lungs are functioning normally. - Contact with sick individuals at gym. - No wheezing or sore throat. - Uses  inhaler when ill. - Last lung x-ray a week ago showed no abnormalities.  PHYSICAL EXAM   Vitals:   05/24/24 0859  BP: 104/60  Pulse: 77  Resp: 17  Temp: 98.4 F (36.9 C)  TempSrc: Tympanic  SpO2: 100%  Weight: 59.9 kg (132 lb)  Height: 1.753 m (5' 9)     Physical Exam Vitals and nursing note reviewed.  Constitutional:      General: She is not in acute distress.    Appearance: Normal appearance. She is not ill-appearing, toxic-appearing or diaphoretic.  HENT:     Head: Normocephalic and atraumatic.     Right Ear: Tympanic membrane, ear canal and external ear normal.     Left Ear: Tympanic membrane, ear canal and external ear normal.     Nose: Congestion present.     Mouth/Throat:     Mouth: Mucous membranes are moist.     Pharynx: No oropharyngeal exudate or posterior oropharyngeal erythema.  Eyes:     Conjunctiva/sclera: Conjunctivae normal.  Cardiovascular:     Rate and Rhythm: Normal rate and regular rhythm.  Pulmonary:     Effort: Pulmonary effort is normal. No respiratory distress.     Breath sounds: No wheezing, rhonchi or rales.     Comments: Tenderness to palpation right lower ribs and lateral aspect of lower ribs Skin:    General: Skin is warm and dry.     Capillary Refill: Capillary refill takes less than 2 seconds.  Neurological:     Mental Status: She is alert and oriented to person, place, and time.        RESULTS  Results for orders placed or performed in visit on 05/24/24  POC SARS-COV-2 SYMPTOMATIC (IDNOW)  Result Value Ref Range   SARS-COV-2 IDNOW Negative Negative   SARS IDNOW Information      A rapid, molecular diagnostic test on the IDNOW.  Negative results should be treated as presumptive and, if inconsistent with clinical symptoms should be confirmed with an alternative molecular assay.     ASSESSMENT/PLAN/MDM   1. Upper respiratory tract infection, unspecified type   2. Dyspnea, unspecified type   3. Chest wall tenderness       Megan [name] is a 68 y.o. female  presents to Urgent care for new onset of respiratory symptoms including mild intermittent shortness of breath as well as nasal congestion without fever.  On exam patient appears in no acute distress.  Vital signs are unremarkable for fever or tachycardia.  Mild chest wall tenderness anterior lower and lateral ribs on the right.  Lungs are clear bilateral.  She is speaking in full sentences.  Nasal congestion is noted.  Rapid COVID is negative. History and physical are consistent with upper respiratory virus.  24-hour antihistamine is recommended along with Motrin  for that chest wall rib pain.  handout for viral infections as well as sore throat are provided.  Patient will return for new or worsening symptoms at any time for further evaluation and possible x-ray if not improving.      UC DISPOSITION   Follow up with PCP  Patient Instructions  Symptoms are consistent with a viral illness.  Work on anti-inflammatories and ice to area of trauma on the lower right chest.  Your lungs are clear on exam and your oxygen saturation is at 100%.  Rapid COVID was negative but you likely have a mutation virus causing your symptoms- return if chest/rib pain not resolving with ice and antiinflammatories for further evaluation and possible chest xray   Hand out provided, I discussed the findings today, diagnosis/differential diagnosis, plan and red flags that require return for reevaluation with PCP,  Urgent care or EMERGENCY. Patient/representitive was agreeable to outlined plan. Questions were answered and patient is stable for discharge.  Provider time spent in patient care today, inclusive of but not limited to clinical reassessment, review of diagnostic studies, and discharge preparation, was less than 30 minutes.  This document was created using the aid of voice recognition Scientist, clinical (histocompatibility and immunogenetics).  Electrically signed by Channing Sero ENP-C MSN at 10:04 AM         [1] Allergies Allergen Reactions  . Amoxicillin -Pot Clavulanate GI Intolerance, Diarrhea and Other (See Comments)  . Calcium  Other (See Comments)  . Clindamycin Diarrhea  . Iron Other (See Comments)    Other reaction(s): severe constipation  . Moxifloxacin Other (See Comments)    neuropathy  . Propofol  Other (See Comments)    Very sensitive to anesthesia  propofol   . Sodium Citrate GI Intolerance    Other Reaction(s): GI Upset (intolerance)  [2] .  5-hydroxytryptophan, 5-HTP, 100 mg cap .  amino acids  cap .  ascorbic acid (VITAMIN C) 1,000 mg tablet .  cholecalciferol (VITAMIN D3) 1,000 unit (25 mcg) tablet .  cyanocobalamin  (VITAMIN B12) 1,000 mcg/mL injection .  cycloSPORINE (RESTASIS) 0.05 % ophthalmic emulsion .  estradioL  (ESTRACE ) 0.01 % (0.1 mg/gram) vaginal cream .  ESTRADIOL , BULK, MISC .  iron/mfolate/C/E/B12/biot/copp (HEMAX,METHYLFOLATE GLUCOSAMIN, ORAL) .  MAGNESIUM CITRAT-TRIPHALA-ALOE ORAL .  multivitamin (MULTIPLE VITAMINS ORAL) .  PROGESTERONE , BULK, MISC .  TESTOSTERONE , BULK, MISC .  thyroid , pork, (  [employer] Thyroid ) 30 mg tablet .  UNABLE TO FIND .  vitamin K2 (MK-7) 90 mcg cap .  albuterol  HFA (PROVENTIL  HFA;VENTOLIN  HFA;PROAIR  HFA) 90 mcg/actuation inhaler .  cyanocobalamin  2,000 mcg TbER .  fluorometholone (FML LIQUIFILM) 0.1 % drps ophthalmic suspension .  geriatric multivitamins-minerals (ELDERTONIC) 0.5-0.6-7-0.7 mg elix elixir .  testosterone  (ANDROGEL ) 1 % (25 mg/2.5gram) glpk

## 2024-05-25 DIAGNOSIS — H6983 Other specified disorders of Eustachian tube, bilateral: Secondary | ICD-10-CM | POA: Insufficient documentation

## 2024-05-25 DIAGNOSIS — J310 Chronic rhinitis: Secondary | ICD-10-CM | POA: Insufficient documentation

## 2024-05-25 NOTE — Progress Notes (Signed)
 Patient ID: Megan [name], female   DOB: [birth year], 68 y.o.   MRN: [id number]  CC: Clogging sensation in ears, possible middle ear effusion  HPI: The patient is a 68 year old female who presents today complaining of clogging sensation in her ears for the past week.  She has been experiencing nasal congestion.  She was recently evaluated at an urgent care center, and was diagnosed with bilateral middle ear effusion.  Currently she denies any significant otalgia, otorrhea, or vertigo.  Exam: General: Communicates without difficulty, well nourished, no acute distress. Head: Normocephalic, no evidence injury, no tenderness, facial buttresses intact without stepoff. Face/sinus: No tenderness to palpation and percussion. Facial movement is normal and symmetric. Eyes: PERRL, EOMI. No scleral icterus, conjunctivae clear. Neuro: CN II exam reveals vision grossly intact.  No nystagmus at any point of gaze. Ears: Auricles well formed without lesions.  Ear canals are intact without mass or lesion.  No erythema or edema is appreciated.  The TMs are intact without fluid. Nose: External evaluation reveals normal support and skin without lesions.  Dorsum is intact.  Anterior rhinoscopy reveals congested mucosa over anterior aspect of inferior turbinates and intact septum.  No purulence noted. Oral:  Oral cavity and oropharynx are intact, symmetric, without erythema or edema.  Mucosa is moist without lesions. Neck: Full range of motion without pain.  There is no significant lymphadenopathy.  No masses palpable.  Thyroid  bed within normal limits to palpation.  Parotid glands and submandibular glands equal bilaterally without mass.  Trachea is midline. Neuro:  CN 2-12 grossly intact.   Assessment: 1.  The patient's history and physical exam findings are consistent with bilateral eustachian tube dysfunction. 2.  Chronic rhinitis with nasal mucosal congestion. 3.  No middle ear effusion or acute infection is  noted.  Plan: 1.  The physical exam findings are reviewed with the patient. 2.  The patient is reassured that no middle ear effusion is noted today. 3.  Flonase nasal spray 2 sprays each nostril daily. 4.  Valsalva exercise multiple times a day. 5.  The patient is encouraged to call with any questions or concerns.

## 2024-05-26 ENCOUNTER — Telehealth (INDEPENDENT_AMBULATORY_CARE_PROVIDER_SITE_OTHER): Payer: Self-pay | Admitting: Otolaryngology

## 2024-05-26 ENCOUNTER — Ambulatory Visit: Payer: Self-pay | Admitting: Pulmonary Disease

## 2024-05-26 ENCOUNTER — Ambulatory Visit (HOSPITAL_BASED_OUTPATIENT_CLINIC_OR_DEPARTMENT_OTHER): Admitting: Physical Therapy

## 2024-05-26 NOTE — Telephone Encounter (Signed)
 Called and spoke with patient, offered her an appointment with Madelin Stank NP on Monday 10/6 at 11:30 am.  Advised she would need to arrive by 11:15 am for check in.  She said to send her a message on the portal and if she could not make it she would call and let us  know.  She was driving at the time of my call and could not look at her calendar.  Appointment made.  Nothing further needed.

## 2024-05-26 NOTE — Telephone Encounter (Signed)
 [phone] is patient's cell phone number and she prefers to be called on this phone because she is leaving home for an allergy specialist appointment today    FYI Only or Action Required?: Action required by provider: request for appointment, clinical question for provider, and update on patient condition.  Patient is followed in Pulmonology for mild intermittent asthma, last seen on 06/15/2023 by Parrett, Madelin RAMAN, NP.  Called Nurse Triage reporting No chief complaint on file..  Symptoms began 3 days ago.  Interventions attempted: Other: ice/heat.  Symptoms are: unchanged.  Triage Disposition: Go to ED Now (or PCP Triage)  Patient/caregiver understands and will follow disposition?: No, wishes to speak to PCP          Copied from CRM #[id number]. Topic: Clinical - Red Word Triage >> May 26, 2024  8:09 AM Russell PARAS wrote: Red Word that prompted transfer to Nurse Triage:   50 lb dog jumped on her Monday Diff to breath Having rib pain Oxygen levels are normal  Dr. Neda Reason for Disposition  Taking a deep breath makes pain worse  Answer Assessment - Initial Assessment Questions After using a spirometer the other day she started having pain on the upper left side and she states that she could not move the ball in the spirometer like she typically can   E2C2 Pulmonary Triage - Initial Assessment Questions Chief Complaint (e.g., cough, sob, wheezing, fever, chills, sweat or additional symptoms) *Go to specific symptom protocol after initial questions. Rib pain--on right lower area of rib cage--went to a Sports Med doc & he thinks the dog mightve done something to her rib to   Patient went to Urgent Care on two days ago and was negative for covid--negative  How long have symptoms been present? 3 days ago--patient's dog   Have you tested for COVID or Flu? Note: If not, ask patient if a home test can be taken. If so, instruct patient to call back for positive  results. No  MEDICINES:   Have you used any OTC meds to help with symptoms? No If yes, ask What medications? No  --- just ice/heat  Have you used your inhalers/maintenance medication? No If yes, What medications? N/a ---has not had to use it since 2020  If inhaler, ask How many puffs and how often? Note: Review instructions on medication in the chart. N/a  OXYGEN: Do you wear supplemental oxygen? No If yes, How many liters are you supposed to use? N/A  Do you monitor your oxygen levels? Yes If yes, What is your reading (oxygen level) today? 99%  What is your usual oxygen saturation reading?  (Note: Pulmonary O2 sats should be 90% or greater) ----     4. PATTERN: Does the pain come and go, or has it been constant since it started?  Does it get worse with exertion?      Worse when walking up steps 5. DURATION: How long does it last (e.g., seconds, minutes, hours)     If she doesn't move she doesn't notice it 6. SEVERITY: How bad is the pain?  (e.g., Scale 1-10; mild, moderate, or severe)     4 and worse with palpation 7. CARDIAC RISK FACTORS: Do you have any history of heart problems or risk factors for heart disease? (e.g., angina, prior heart attack; diabetes, high blood pressure, high cholesterol, smoker, or strong family history of heart disease)     Extra beats and skipped beats but I've been told its nothing that needs  to be treated 8. PULMONARY RISK FACTORS: Do you have any history of lung disease?  (e.g., blood clots in lung, asthma, emphysema, birth control pills)     Post Viral Reactive Small Airway Disease after COVID 9. CAUSE: What do you think is causing the chest pain?     Dog stood on her chest while she was laying down 10. OTHER SYMPTOMS: Do you have any other symptoms? (e.g., dizziness, nausea, vomiting, sweating, fever, difficulty breathing, cough)       Dizzy---patient thinks this may be allergies--has an appt this  morning to see allergy specialist  Protocols used: Chest Pain-A-AH

## 2024-05-26 NOTE — Telephone Encounter (Signed)
 Patient called and stated that the Flonase gives her headaches and making her dizzy. Wanted to know if  she can try either Allegra or Zyrtec. Per dr. Karis FERNS told her that she can try both.

## 2024-05-27 ENCOUNTER — Other Ambulatory Visit: Payer: Self-pay

## 2024-05-27 DIAGNOSIS — D510 Vitamin B12 deficiency anemia due to intrinsic factor deficiency: Secondary | ICD-10-CM

## 2024-05-27 DIAGNOSIS — D508 Other iron deficiency anemias: Secondary | ICD-10-CM

## 2024-05-27 DIAGNOSIS — D72819 Decreased white blood cell count, unspecified: Secondary | ICD-10-CM

## 2024-05-28 ENCOUNTER — Encounter (HOSPITAL_BASED_OUTPATIENT_CLINIC_OR_DEPARTMENT_OTHER): Payer: Self-pay

## 2024-05-30 ENCOUNTER — Encounter: Payer: Self-pay | Admitting: Adult Health

## 2024-05-30 ENCOUNTER — Inpatient Hospital Stay: Attending: Hematology

## 2024-05-30 ENCOUNTER — Other Ambulatory Visit

## 2024-05-30 ENCOUNTER — Ambulatory Visit (INDEPENDENT_AMBULATORY_CARE_PROVIDER_SITE_OTHER): Admitting: Adult Health

## 2024-05-30 VITALS — BP 92/52 | HR 62 | Temp 97.9°F | Ht 69.0 in | Wt 133.0 lb

## 2024-05-30 DIAGNOSIS — R7989 Other specified abnormal findings of blood chemistry: Secondary | ICD-10-CM | POA: Diagnosis not present

## 2024-05-30 DIAGNOSIS — D508 Other iron deficiency anemias: Secondary | ICD-10-CM

## 2024-05-30 DIAGNOSIS — D510 Vitamin B12 deficiency anemia due to intrinsic factor deficiency: Secondary | ICD-10-CM | POA: Diagnosis present

## 2024-05-30 DIAGNOSIS — S20211A Contusion of right front wall of thorax, initial encounter: Secondary | ICD-10-CM

## 2024-05-30 DIAGNOSIS — S298XXD Other specified injuries of thorax, subsequent encounter: Secondary | ICD-10-CM

## 2024-05-30 DIAGNOSIS — J4520 Mild intermittent asthma, uncomplicated: Secondary | ICD-10-CM

## 2024-05-30 DIAGNOSIS — D72819 Decreased white blood cell count, unspecified: Secondary | ICD-10-CM

## 2024-05-30 LAB — CBC WITH DIFFERENTIAL (CANCER CENTER ONLY)
Abs Immature Granulocytes: 0 K/uL (ref 0.00–0.07)
Basophils Absolute: 0 K/uL (ref 0.0–0.1)
Basophils Relative: 1 %
Eosinophils Absolute: 0 K/uL (ref 0.0–0.5)
Eosinophils Relative: 1 %
HCT: 40.2 % (ref 36.0–46.0)
Hemoglobin: 13.6 g/dL (ref 12.0–15.0)
Immature Granulocytes: 0 %
Lymphocytes Relative: 25 %
Lymphs Abs: 0.8 K/uL (ref 0.7–4.0)
MCH: 31.3 pg (ref 26.0–34.0)
MCHC: 33.8 g/dL (ref 30.0–36.0)
MCV: 92.6 fL (ref 80.0–100.0)
Monocytes Absolute: 0.2 K/uL (ref 0.1–1.0)
Monocytes Relative: 7 %
Neutro Abs: 2 K/uL (ref 1.7–7.7)
Neutrophils Relative %: 66 %
Platelet Count: 155 K/uL (ref 150–400)
RBC: 4.34 MIL/uL (ref 3.87–5.11)
RDW: 12.7 % (ref 11.5–15.5)
WBC Count: 3.1 K/uL — ABNORMAL LOW (ref 4.0–10.5)
nRBC: 0 % (ref 0.0–0.2)

## 2024-05-30 LAB — CMP (CANCER CENTER ONLY)
ALT: 12 U/L (ref 0–44)
AST: 22 U/L (ref 15–41)
Albumin: 4.5 g/dL (ref 3.5–5.0)
Alkaline Phosphatase: 48 U/L (ref 38–126)
Anion gap: 4 — ABNORMAL LOW (ref 5–15)
BUN: 15 mg/dL (ref 8–23)
CO2: 28 mmol/L (ref 22–32)
Calcium: 9.6 mg/dL (ref 8.9–10.3)
Chloride: 108 mmol/L (ref 98–111)
Creatinine: 0.64 mg/dL (ref 0.44–1.00)
GFR, Estimated: >60 mL/min (ref >60)
Glucose, Bld: 98 mg/dL (ref 70–99)
Potassium: 4.3 mmol/L (ref 3.5–5.1)
Sodium: 140 mmol/L (ref 135–145)
Total Bilirubin: 0.7 mg/dL (ref 0.0–1.2)
Total Protein: 6.8 g/dL (ref 6.5–8.1)

## 2024-05-30 LAB — VITAMIN B12: Vitamin B-12: 2242 pg/mL — ABNORMAL HIGH (ref 180–914)

## 2024-05-30 LAB — IRON AND IRON BINDING CAPACITY (CC-WL,HP ONLY)
Iron: 107 ug/dL (ref 28–170)
Saturation Ratios: 29 % (ref 10.4–31.8)
TIBC: 367 ug/dL (ref 250–450)
UIBC: 260 ug/dL (ref 148–442)

## 2024-05-30 LAB — FERRITIN: Ferritin: 102 ng/mL (ref 11–307)

## 2024-05-30 NOTE — Progress Notes (Signed)
 @Patient  ID: Megan [name], female    DOB: 06-Oct-1955, 68 y.o.   MRN: [id number]  Chief Complaint  Patient presents with   Acute Visit    SOB and rib pain.     Referring provider: Dwight Trula SQUIBB, MD  HPI: 68 yo female never smoker followed for Asthma and Allergic Rhinitis  Previous history of Severe pneumonia in 2009 with residual atelectasis-required bronchoscopy (Hospitalization)  Has followed with integrative medicine specialist with previous treatment with prolonged antifungal therapy for mold exposure (water leak) and ivermectin for long-haul COVID Congenital atrial septal aneurysm extending to the RVOT   TEST/EVENTS :  CT chest April 11, 2022 showed negative PE, mild scarring/atelectasis in the lingula unchanged   PFTs September 19, 2022 showed normal lung function  Prior MRA 08/2013 aneurysmal dilation of membranous ventricular septum extending into RV outflow tract with no sinus of valsalva aneurysm.  Carotid duplex 03/2015 normal.  Echo 12/2016 normal LVEF, mild MR.  Stress echo 09/2017 normal.  CTA 01/2018 aneurysm of membranous ventricular septum that bulges into RV outflow tract. No connection between LV and RV. No sinus of valsalva aneurysm. Calcium  score 0.  ABIs 03/2019 revealed noncompressible LLE artery and normal right.  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 Carotid duplex 03/2023 with no stenosis.    Discussed the use of AI scribe software for clinical note transcription with the patient, who gave verbal consent to proceed.  History of Present Illness Megan [name] is a 68 year old female with history of mild intermittent asthma who  presents with rib pain and difficulty breathing following her dog jumping onto her.   Last week her dog, weighing approximately fifty  pounds, jumped on her, potentially injuring her ribs. She had significant pain along the right lateral and posterior lower ribs radiating from her side to her mid to low back. By Friday, she experienced difficulty breathing taking in deep breath, particularly when walking up stairs or hills. She used an incentive spirometer at home and noted achieving only half of her usual capacity, prompting further medical evaluation.  On Thursday, she visited her primary care provider at [facility], who ordered x-rays. She visited a walk-in clinic on Friday where a doctor reviewed the x-rays and told her there were no dramatic findings, Felt she had rib contusion.  Final report is not available.      She has a history of long haul COVID, which has shown improvement over time. She maintains a routine follow-up with cardiology and has recently had blood work done,  She prefers natural treatments and has reduced her supplement intake. She uses a CBD rollerball for pain relief and has taken Advil  twice in the past week. She avoids Tylenol  unless necessary. No ccough, or wheezing. Reports difficulty breathing when walking up stairs or hills. Does feel over last couple of days symptoms are getting better.  She can take in a deeper breath.  She denies any hemoptysis or exertional chest pain.  O2 saturations today in the office are 99% on room air     Allergies  Allergen Reactions   Augmentin [Amoxicillin -Pot Clavulanate] Diarrhea   Calcium  Other (See Comments)    Other Reaction(s): Not available   Clindamycin/Lincomycin Diarrhea   Iron     Other reaction(s): severe constipation   Moxifloxacin Other (  See Comments)    neuropathy Other reaction(s): neuropathy   Propofol  Other (See Comments)    Very sensitive to anesthesia  Other Reaction(s): Not available  propofol   Very sensitive to anesthesia    propofol    Sodium Citrate     Other Reaction(s): GI Upset (intolerance)    Immunization History  Administered  Date(s) Administered   Influenza Split 06/13/2009, 06/25/2010, 03/05/2012, 07/12/2012, 06/17/2013   Influenza,inj,quad, With Preservative 08/04/2014   Influenza-Unspecified 03/05/2012   Janssen (J&J) SARS-COV-2 Vaccination 12/31/2019   Pneumococcal Polysaccharide-23 08/08/2010, 07/26/2015   Td 06/27/2002, 11/08/2013   Tdap 08/31/2003, 11/08/2013    Past Medical History:  Diagnosis Date   Allergic rhinitis    Allergy    Aneurysm of cardiac wall, congenital    a.  septal aneurysm extending into the RVOT (by cardiac MRI in 08/2013)   Anxiety    Aortic atherosclerosis    Arthritis    Basal cell carcinoma    Depression    Heart murmur    Hypothyroidism    on meds   Leukopenia    idiopathic   Lyme disease    Mitral valve prolapse    MVP (mitral valve prolapse)    Orthostatic hypotension    Osteoporosis    Pancreatic cyst    Peripheral neuropathy 06/09/2012   Pernicious anemia    Plantar fasciitis    Pneumothorax    TMJ (dislocation of temporomandibular joint)     Tobacco History: Social History   Tobacco Use  Smoking Status Never  Smokeless Tobacco Never   Counseling given: Not Answered   Outpatient Medications Prior to Visit  Medication Sig Dispense Refill   5-Hydroxytryptophan (5-HTP) 100 MG CAPS Take 100 mg by mouth daily.      albuterol  (VENTOLIN  HFA) 108 (90 Base) MCG/ACT inhaler Inhale 1 puff into the lungs daily as needed for wheezing or shortness of breath.     AMBULATORY NON FORMULARY MEDICATION 3 (three) times daily. Medication Name: Amino acids  double wood     ARMOUR THYROID  30 MG tablet Take 1 tablet (30 mg total) by mouth daily. 90 tablet 3   Ascorbic Acid (VITAMIN C) POWD Take 2,500 mg by mouth daily.     Cholecalciferol (VITAMIN D3) 10000 units capsule Take 10,000 Units by mouth daily.  (Patient taking differently: Take 5,000 Units by mouth daily.)     Cyanocobalamin  (VITAMIN B-12 IJ) Inject 5,000 mcg as directed once a week. (Patient taking  differently: Inject 5,000 mcg as directed as directed. Every other week)     cycloSPORINE (RESTASIS) 0.05 % ophthalmic emulsion Place 1 drop into both eyes 2 (two) times daily.     estradiol  (ESTRACE ) 0.1 MG/GM vaginal cream Place 1 Applicatorful vaginally 3 (three) times a week.     MAGNESIUM CITRATE PO Take 1,000 mg by mouth daily. (Patient taking differently: Take 800 mg by mouth daily.)     Menaquinone-7 (VITAMIN K2) 100 MCG CAPS Take 1 capsule by mouth daily.     Multiple Vitamin (MULTIVITAMIN) capsule Take 1 capsule by mouth daily.     NON FORMULARY Take 1 tablet by mouth 2 (two) times a week. (Patient taking differently: Take 1 tablet by mouth as needed.)     NONFORMULARY OR COMPOUNDED ITEM Exosome 1 trillion, I injection monthy     NONFORMULARY OR COMPOUNDED ITEM Take 0.3125 mg by mouth daily. Estradiol  troche     OVER THE COUNTER MEDICATION Take 1 capsule by mouth daily.     progesterone  (PROMETRIUM ) 100  MG capsule Take 1.5 capsules by mouth daily. (Patient taking differently: Take 1 capsule by mouth daily.)     TESTOSTERONE  UNDECANOATE PO Take 1 mg by mouth daily.     fish oil-omega-3 fatty acids 1000 MG capsule Take 1 g by mouth daily. (Patient not taking: Reported on 05/30/2024)     metoprolol  tartrate (LOPRESSOR ) 25 MG tablet Take half tablet (12.5mg ) as needed for palpitations up to twice per day. (Patient not taking: Reported on 05/30/2024) 30 tablet 1   NON FORMULARY Take 1,000 mcg by mouth daily. Methyl Folate chewable tablet (Patient not taking: Reported on 05/30/2024)     Prucalopride Succinate  (MOTEGRITY ) 1 MG TABS Take 1 tablet by mouth daily. (Patient not taking: Reported on 05/30/2024) 30 tablet 1   Cyanocobalamin  (VITAMIN B-12 ER PO) Take 5,000 mcg by mouth daily.  (Patient taking differently: Take 5,000 mcg by mouth 3 (three) times a week.)     No facility-administered medications prior to visit.     Review of Systems:   Constitutional:   No  weight loss, night sweats,   Fevers, chills, fatigue, or  lassitude.  HEENT:   No headaches,  Difficulty swallowing,  Tooth/dental problems, or  Sore throat,                No sneezing, itching, ear ache, nasal congestion, post nasal drip,   CV:  No chest pain,  Orthopnea, PND, swelling in lower extremities, anasarca, dizziness, palpitations, syncope.   GI  No heartburn, indigestion, abdominal pain, nausea, vomiting, diarrhea, change in bowel habits, loss of appetite, bloody stools.   Resp:    No chest wall deformity  Skin: no rash or lesions.  GU: no dysuria, change in color of urine, no urgency or frequency.  No flank pain, no hematuria   MS:  No joint pain or swelling.  No decreased range of motion.  No back pain.    Physical Exam  BP (!) 92/52   Pulse 62   Temp 97.9 F (36.6 C)   Ht 5' 9 (1.753 m)   Wt 133 lb (60.3 kg)   SpO2 99% Comment: RA  BMI 19.64 kg/m   GEN: A/Ox3; pleasant , NAD, thin female   HEENT:  [place]/AT,  NOSE-clear, THROAT-clear, no lesions, no postnasal drip or exudate noted.   NECK:  Supple w/ fair ROM; no JVD; normal carotid impulses w/o bruits; no thyromegaly or nodules palpated; no lymphadenopathy.    RESP  Clear  P & A; w/o, wheezes/ rales/ or rhonchi. no accessory muscle use, no dullness to percussion No external bruising is noted.  No rib deformity noted.  CARD:  RRR, no m/r/g, no peripheral edema, pulses intact, no cyanosis or clubbing.  GI:   Soft & nt; nml bowel sounds; no organomegaly or masses detected.   Musco: Warm bil, no deformities or joint swelling noted.   Neuro: alert, no focal deficits noted.    Skin: Warm, no lesions or rashes    Lab Results:   BMET   BNP No results found for: BNP  ProBNP No results found for: PROBNP  Imaging: No results found.  Administration History     None          Latest Ref Rng & Units 09/19/2022   12:51 PM  PFT Results  FVC-Pre L 3.88   FVC-Predicted Pre % 104   FVC-Post L 3.91   FVC-Predicted Post  % 104   Pre FEV1/FVC % % 73   Post FEV1/FCV % %  75   FEV1-Pre L 2.85   FEV1-Predicted Pre % 100   FEV1-Post L 2.93     No results found for: NITRICOXIDE      Assessment & Plan:   Assessment and Plan Assessment & Plan Right rib contusion  She sustained a right rib contusion  Pain is improving but persists, especially with deep breaths and physical activity. X-ray results are pending due to a delay, though no significant findings were noted by the initial reviewing physician per patient  Avoid heavy lifting and strenuous activity to allow healing. Use an incentive spirometer to aid lung expansion and prevent complications like pneumonia. Manage pain with Advil  or Tylenol  as needed, though she prefers minimal medication. Encourage light walking to promote healing without straining the rib area. Apply warm heat to the affected area. X-ray results will be communicated once available.  Mild intermittent asthma   Her mild intermittent asthma is well controlled. There has been no recent use of albuterol  and no reports of cough or wheezing. A recent chest x-ray is consistent with known reactive small airway disease. Continue the current asthma management plan and monitor for any changes in symptoms.    Plan  Patient Instructions  Warm heat to ribs as needed  Advance activity as tolerated Tylenol  or advil  as needed.  Please send results of chest xray when available.  Albuterol  inhaler or Duoneb as needed for wheezing/asthma flare  Follow up with in 1 year with Dr. Neda or Crue Otero NP and As needed         Madelin Stank, NP 05/30/2024

## 2024-05-30 NOTE — Patient Instructions (Addendum)
 Warm heat to ribs as needed  Advance activity as tolerated Tylenol  or advil  as needed.  Please send results of chest xray when available.  Albuterol  inhaler or Duoneb as needed for wheezing/asthma flare  Follow up with in 1 year with Dr. Neda or Jailin Manocchio NP and As needed

## 2024-05-31 ENCOUNTER — Encounter (HOSPITAL_BASED_OUTPATIENT_CLINIC_OR_DEPARTMENT_OTHER): Payer: Self-pay | Admitting: Family

## 2024-05-31 ENCOUNTER — Other Ambulatory Visit: Payer: Self-pay | Admitting: Internal Medicine

## 2024-05-31 ENCOUNTER — Ambulatory Visit (INDEPENDENT_AMBULATORY_CARE_PROVIDER_SITE_OTHER): Admitting: Family

## 2024-05-31 ENCOUNTER — Encounter: Payer: Self-pay | Admitting: Internal Medicine

## 2024-05-31 VITALS — BP 118/60 | HR 64 | Ht 69.0 in | Wt 113.6 lb

## 2024-05-31 DIAGNOSIS — E039 Hypothyroidism, unspecified: Secondary | ICD-10-CM

## 2024-05-31 DIAGNOSIS — I349 Nonrheumatic mitral valve disorder, unspecified: Secondary | ICD-10-CM | POA: Diagnosis not present

## 2024-05-31 DIAGNOSIS — R42 Dizziness and giddiness: Secondary | ICD-10-CM | POA: Diagnosis not present

## 2024-05-31 DIAGNOSIS — I059 Rheumatic mitral valve disease, unspecified: Secondary | ICD-10-CM

## 2024-05-31 DIAGNOSIS — I491 Atrial premature depolarization: Secondary | ICD-10-CM

## 2024-05-31 MED ORDER — MIDODRINE HCL 2.5 MG PO TABS: 2.5000 mg | ORAL_TABLET | Freq: Three times a day (TID) | ORAL | 1 refills | Status: DC | PRN

## 2024-05-31 NOTE — Progress Notes (Addendum)
 Cardiology Office Note:  .   Date:  05/31/2024  ID:  Megan [name], DOB Mar 26, 1956, MRN [id number] PCP: Dwight Trula SQUIBB, MD  [facility] Providers Cardiologist:  Redell Shallow, MD    History of Present Illness: Megan [name] is a 68 y.o. female  with a hx of aneurysm of membranous ventricular septum, thyroid  disease, aortic atherosclerosis.   Prior MRA 08/2013 aneurysmal dilation of membranous ventricular septum extending into RV outflow tract with no sinus of valsalva aneurysm. Carotid duplex 03/2015 normal. Echo 12/2016 normal LVEF, mild MR. Stress echo 09/2017 normal. CTA 01/2018 aneurysm of membranous ventricular septum that bulges into RV outflow tract. No connection between LV and RV. No sinus of valsalva aneurysm. Calcium  score 0. ABIs 03/2019 revealed noncompressible LLE artery and normal right. 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 Monitor 01/2020 due to palpitations SR, SB, ST with rare PAC/PVC and brief PAT. ABIs 09/2020 normal.    Seen 05/08/22 noting occasional chest discomfort at rest or with activity that was atypical for angina not recommended for further workup.    Seen 08/27/22 for chest pain, palpitations. ZIO and CTA ordered. Monitor with predominantly NSR, average rate 70 bpm, 6% PAC burden. Toprol  25mg  QD added. CTA scheduled for 09/26/22.However she was nervous about pre-medications for CTA and was instead scheduled for coronary calcium  score which was 0.   Echo 11/2022 at outside provider LVEF 65-70%, no RWMA, RV normal, borderline MVP, trace TR. Carotid duplex 03/2023 with no stenosis.   Last seen 03/2024, NMR panel 03/31/24 (LDL particle number 918, LDL 108, HDL 77, triglycerides 42, cholesterol 193, LDL size 21). She preferred lifestyle changes and NMR in 6 months.   Presents today for >1 month history of lightheadedness. Symptoms occurring at rest, when walking up  hills/stairs, in hot shower. She notes concerns about driving. No frank syncope. She also notes her legs feeling wobbly and weak. She has had no visual changes. SBPs in 80s at home. She monitors BP mostly when she is lightheaded. She was evaluated at [facility] on 04/23/24 for similar and referred to [facility] ED for hypotension, reporting her DBP was in 30s. ED course with normal troponin, lytes, CBC. On 05/24/24 she was evaluated at [facility] for rib pain/contusion, after dog jumped on her, and congestion, with unremarkable findings. She saw ENT for suspected middle ear effusion and was given trial of flonase and antihistamines which she did not tolerate and is no longer taking. She was evaluated by pulmonary yesterday for her rib contusion and asthma, which is controlled. Pulmonary suggested cardiology follow up.  Prior to her rib pain/contusion, she was doing spin class 3x/weekly and did not experience lightheadedness during exercise. She notes rare palpitations, but no increased frequency in last 30 days. She has not required PRN metoprolol . She denies chest pain, shortness of breath, edema, orthopnea.  She hydrates with 40oz water daily + electrolytes and more liquid in smoothies, etc.   She expressed concern and anxiety over upcoming periodontal surgery on 10/21, as she was informed there may be infection and she may could lose a tooth.  ROS: Please see the history of present illness.    All other systems reviewed and are negative.  Studies Reviewed: Megan [name]   EKG Interpretation Date/Time:  Tuesday May 31 2024 08:58:27 EDT Ventricular Rate:  60 PR Interval:  182 QRS Duration:  72 QT Interval:  420  QTC Calculation: 420 R Axis:   86  Text Interpretation: Sinus rhythm with Premature atrial complexes When compared with ECG of 23-Apr-2024 16:08, PREVIOUS ECG IS PRESENT Confirmed by Vannie Mora ([id number]) on 05/31/2024 9:03:45 AM   Cardiac Studies & Procedures    ______________________________________________________________________________________________   STRESS TESTS  ECHOCARDIOGRAM STRESS TEST 10/20/2017  Narrative *[facility]* [address] [place], [place] [address] [phone]  ------------------------------------------------------------------- Stress Echocardiography  Patient:    Megan, [name] MR #:       [id number] Study Date: 10/20/2017 Gender:     F Age:        61 Height:     175.3 cm Weight:     60 kg BSA:        1.7 m^2 Pt. Status: Room:  BARTON Signa Rush [id number] ORDERING     Barrett, Shona MATSU REFERRING    Barrett, Shona MATSU SONOGRAPHER  Carl Plunk, RDCS PERFORMING   [facility], Outpatient ATTENDING    Annabella Scarce, MD  cc:  -------------------------------------------------------------------  ------------------------------------------------------------------- Indications:      R07.9 Chest pain.  ------------------------------------------------------------------- History:   PMH:  Asthma. MVP. Hypothyroidism.  Medications:  No other medications.  ------------------------------------------------------------------- Study Conclusions  - Stress ECG conclusions: There were no stress arrhythmias or conduction abnormalities. The stress ECG was normal. - Staged echo: There was no echocardiographic evidence for stress-induced ischemia.  Impressions:  - Normal stress echo.  ------------------------------------------------------------------- Study data:   Study status:  Routine.  Consent:  The risks, benefits, and alternatives to the procedure were explained to the patient and informed consent was obtained.  Procedure:  The patient reported no pain pre or post test. Initial setup. The patient was brought to the laboratory. A baseline ECG was recorded. Surface ECG leads and automatic cuff blood pressure measurements were monitored. Treadmill exercise testing was performed using the  Bruce protocol. The patient exercised for 11 min, to protocol stage 4, to a maximal work rate of 13.4 mets. Exercise was terminated due to achievement of target heart rate, patient request, and fatigue. The patient was positioned for image acquisition and recovery monitoring. Transthoracic stress echocardiography for chest pain evaluation. Image quality was adequate. Images were captured at baseline and peak exercise.  Study completion:  The patient tolerated the procedure well. There were no complications. Bruce protocol. Stress echocardiography.  Birthdate:  Patient birthdate: Mar 19, 1956.  Age:  Patient is 68 yr old.  Sex:  Gender: female.    BMI: 19.5 kg/m^2.  Blood pressure:     110/71  Patient status:  Outpatient.  Study date:  Study date: 10/20/2017. Study time: 07:45 AM.  -------------------------------------------------------------------  ------------------------------------------------------------------- Baseline ECG:  Normal.  Normal sinus rhythm.  ------------------------------------------------------------------- Stress protocol:  +---------------------+---+-----------+---------+ !Stage                !HR !BP (mmHg)  !Symptoms ! +---------------------+---+-----------+---------+ !Baseline             !64 !100/70 (80)!None     ! +---------------------+---+-----------+---------+ !Stage 1              !90 !151/71 (98)!None     ! +---------------------+---+-----------+---------+ !Stage 2              !890!867/31 (89)!None     ! +---------------------+---+-----------+---------+ !Stage 3              !126!152/68 (96)!Fatigue  ! +---------------------+---+-----------+---------+ !Stage 4              !150!-----------!Fatigue  ! +---------------------+---+-----------+---------+ !  Immediate post stress!148!124/63 (83)!Subsiding! +---------------------+---+-----------+---------+ !Recovery; 1 min      !111!-----------!None      ! +---------------------+---+-----------+---------+ !Recovery; 2 min      !89 !-----------!None     ! +---------------------+---+-----------+---------+ !Recovery; 3 min      !83 !107/58 (74)!None     ! +---------------------+---+-----------+---------+ !Recovery; 4 min      !78 !-----------!None     ! +---------------------+---+-----------+---------+ !Recovery; 5 min      !76 !-----------!None     ! +---------------------+---+-----------+---------+ !Late recovery        !79 !103/57 (72)!None     ! +---------------------+---+-----------+---------+  ------------------------------------------------------------------- Stress results:   Maximal heart rate during stress was 150 bpm (95% of maximal predicted heart rate). The maximal predicted heart rate was 158 bpm.The target heart rate was achieved. The heart rate response to stress was normal. There was a normal resting blood pressure with an appropriate response to stress. The rate-pressure product for the peak heart rate and blood pressure was 80847 mm Hg/min.  The patient experienced no chest pain during stress.  ------------------------------------------------------------------- Stress ECG:  There were no stress arrhythmias or conduction abnormalities.  The stress ECG was normal.  ------------------------------------------------------------------- Baseline:  Rest:  - LV size was normal. - LV global systolic function was normal. The estimated LV ejection fraction was 60-65%. - Normal wall motion; no LV regional wall motion abnormalities.  - RV size was normal. - There were no RV regional wall motion abnormalities.  Stress:  - LV size was normal. - LV global systolic function was vigorous and appropriately augmented from baseline. The estimated LV ejection fraction was 65-70%. - Normal wall motion; no LV regional wall motion abnormalities.  ------------------------------------------------------------------- Stress echo  results:     Left ventricular ejection fraction was normal at rest and with stress. There was no echocardiographic evidence for stress-induced ischemia.  ------------------------------------------------------------------- Prepared and Electronically Authenticated by  Annabella Scarce, MD 2019-02-26T14:39:59   ECHOCARDIOGRAM  ECHOCARDIOGRAM COMPLETE 01/06/2017  Narrative *[name]* [address] [place], [place] [address] [phone]  ------------------------------------------------------------------- Transthoracic Echocardiography  Patient:    Kuulei, Kleier MR #:       [id number] Study Date: 01/06/2017 Gender:     F Age:        72 Height:     176.5 cm Weight:     60.1 kg BSA:        1.71 m^2 Pt. Status: Room:  SONOGRAPHER  Carl Plunk, RDCS PERFORMING   [facility], Outpatient ATTENDING    Johnson, Laymon CHRISTELLA TISA Johnson, [place] M REFERRING    Johnson [place] M  cc:  ------------------------------------------------------------------- LV EF: 60% -   65%  ------------------------------------------------------------------- Indications:      R06.00 Dyspnea.  ------------------------------------------------------------------- History:   PMH:  Acquired from the patient and from the patient&'s chart.  PMH:  Asthma. Hypothyroidism. Mitral Valve Prolapse. Peripheral Neuropathy. Pneumothorax.  ------------------------------------------------------------------- Study Conclusions  - Left ventricle: The cavity size was normal. Wall thickness was normal. Systolic function was normal. The estimated ejection fraction was in the range of 60% to 65%. Wall motion was normal; there were no regional wall motion abnormalities. Left ventricular diastolic function parameters were normal. - Mitral valve: There was mild regurgitation.  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  ------------------------------------------------------------------- Study data:   Study status:  Routine.  Procedure:  The patient reported no pain  pre or post test. Transthoracic echocardiography for left ventricular function evaluation, for right ventricular function evaluation, and for assessment of valvular function. Image quality was adequate.  Study completion:  There were no complications.          Transthoracic echocardiography.  M-mode, complete 2D, spectral Doppler, and color Doppler.  Birthdate: Patient birthdate: 1956-07-27.  Age:  Patient is 68 yr old.  Sex: Gender: female.    BMI: 19.3 kg/m^2.  Blood pressure:     96/61 Patient status:  Outpatient.  Study date:  Study date: 01/06/2017. Study time: 01:57 PM.  Location:  [facility]  -------------------------------------------------------------------  ------------------------------------------------------------------- Left ventricle:  The cavity size was normal. Wall thickness was normal. Systolic function was normal. The estimated ejection fraction was in the range of 60% to 65%. Wall motion was normal; there were no regional wall motion abnormalities. Left ventricular diastolic function parameters were normal.  ------------------------------------------------------------------- Aortic valve:   Trileaflet; normal thickness, mildly calcified leaflets. Mobility was not restricted.  Doppler:  Transvalvular velocity was within the normal range. There was no stenosis. There was no regurgitation.  ------------------------------------------------------------------- Aorta:  Aortic root: The aortic root was normal in size.  ------------------------------------------------------------------- Mitral valve:   Structurally normal valve.   Mobility was not restricted.  Doppler:  Transvalvular velocity was within the normal range. There was no evidence for stenosis. There was  mild regurgitation.    Peak gradient (D): 4 mm Hg.  ------------------------------------------------------------------- Left atrium:  The atrium was normal in size.  ------------------------------------------------------------------- Right ventricle:  The cavity size was normal. Systolic function was normal.  ------------------------------------------------------------------- Pulmonic valve:    Doppler:  Transvalvular velocity was within the normal range. There was no evidence for stenosis.  ------------------------------------------------------------------- Tricuspid valve:   Structurally normal valve.    Doppler: Transvalvular velocity was within the normal range. There was trivial regurgitation.  ------------------------------------------------------------------- Right atrium:  The atrium was normal in size.  ------------------------------------------------------------------- Pericardium:  There was no pericardial effusion.  ------------------------------------------------------------------- Systemic veins: Inferior vena cava: The vessel was normal in size.  ------------------------------------------------------------------- Measurements  Left ventricle                         Value        Reference LV ID, ED, PLAX chordal        (L)     39.1  mm     43 - 52 LV ID, ES, PLAX chordal                25.9  mm     23 - 38 LV fx shortening, PLAX chordal         34    %      >=29 LV PW thickness, ED                    8.01  mm     --------- IVS/LV PW ratio, ED                    0.97         <=1.3 Stroke volume, 2D                      70    ml     --------- Stroke volume/bsa, 2D                  41    ml/m^2 --------- LV e&', lateral  13.9  cm/s   --------- LV E/e&', lateral                       7.55         --------- LV e&', medial                          9.54  cm/s   --------- LV E/e&', medial                        11.01        --------- LV e&',  average                         11.72 cm/s   --------- LV E/e&', average                       8.96         ---------  Ventricular septum                     Value        Reference IVS thickness, ED                      7.8   mm     ---------  LVOT                                   Value        Reference LVOT ID, S                             20    mm     --------- LVOT area                              3.14  cm^2   --------- LVOT ID                                20    mm     --------- LVOT peak velocity, S                  82    cm/s   --------- LVOT mean velocity, S                  62    cm/s   --------- LVOT VTI, S                            22.2  cm     --------- LVOT peak gradient, S                  3     mm Hg  --------- Stroke volume (SV), LVOT DP            69.7  ml     --------- Stroke index (SV/bsa), LVOT DP         40.9  ml/m^2 ---------  Aorta  Value        Reference Aortic root ID, ED                     28    mm     --------- Ascending aorta ID, A-P, S             28    mm     ---------  Left atrium                            Value        Reference LA ID, A-P, ES                         32    mm     --------- LA ID/bsa, A-P                         1.87  cm/m^2 <=2.2 LA volume, S                           44    ml     --------- LA volume/bsa, S                       25.8  ml/m^2 --------- LA volume, ES, 1-p A4C                 43    ml     --------- LA volume/bsa, ES, 1-p A4C             25.2  ml/m^2 --------- LA volume, ES, 1-p A2C                 45    ml     --------- LA volume/bsa, ES, 1-p A2C             26.4  ml/m^2 ---------  Mitral valve                           Value        Reference Mitral E-wave peak velocity            105   cm/s   --------- Mitral A-wave peak velocity            72.1  cm/s   --------- Mitral deceleration time               197   ms     150 - 230 Mitral peak gradient, D                4     mm Hg   --------- Mitral E/A ratio, peak                 1.5          ---------  Tricuspid valve                        Value        Reference Tricuspid regurg peak velocity         225   cm/s   --------- Tricuspid peak RV-RA gradient          20    mm Hg  ---------  Right ventricle  Value        Reference RV s&', lateral, S                      13.6  cm/s   ---------  Legend: (L)  and  (H)  mark values outside specified reference range.  ------------------------------------------------------------------- Prepared and Electronically Authenticated by  Redell Shallow 2018-05-15T15:50:14    MONITORS  LONG TERM MONITOR (3-14 DAYS) 09/17/2022  Narrative Patch Wear Time:  13 days and 0 hours (2024-01-03T15:31:02-499 to 2024-01-16T16:14:30-0500)  Patient had a min HR of 48 bpm, max HR of 156 bpm, and avg HR of 70 bpm. Predominant underlying rhythm was Sinus Rhythm. Slight P wave morphology changes were noted. 2 Ventricular Tachycardia runs occurred, the run with the fastest interval lasting 4 beats with a max rate of 156 bpm, the longest lasting 7 beats with an avg rate of 114 bpm. 8 Supraventricular Tachycardia runs occurred, the run with the fastest interval lasting 5 beats with a max rate of 138 bpm, the longest lasting 6 beats with an avg rate of 97 bpm. Some episodes of Supraventricular Tachycardia may be possible Atrial Tachycardia with variable block. Isolated SVEs were frequent (6.1%, 74502), SVE Couplets were rare (<1.0%, 4170), and SVE Triplets were rare (<1.0%, 364). Isolated VEs were rare (<1.0%, 3986), VE Couplets were rare (<1.0%, 9), and VE Triplets were rare (<1.0%, 1). Ventricular Bigeminy was present.  Sinus rhythm with PACs, brief PAT, PVCs and nonsustained ventricular tachycardia (4 and 6 beats). Redell Shallow   CT SCANS  CT CARDIAC SCORING (SELF PAY ONLY) 09/25/2022  Addendum 09/28/2022  1:23 AM ADDENDUM REPORT: 09/28/2022 01:21  EXAM: OVER-READ  INTERPRETATION  CT CHEST  The following report is an over-read performed by radiologist Dr. Franky Leff [facility] on 09/28/2022. This over-read does not include interpretation of cardiac or coronary anatomy or pathology. The coronary calcium  score interpretation by the cardiologist is attached.  COMPARISON:  04/11/2022  FINDINGS: Heart is normal size. Aorta normal caliber. No adenopathy. No confluent opacities or effusions. No acute findings in the upper abdomen. Chest wall soft tissues are unremarkable. No acute bony abnormality.  IMPRESSION: No acute or significant extracardiac abnormality.   Electronically Signed By: Franky Crease M.D. On: 09/28/2022 01:21  Narrative CLINICAL DATA:  15F for cardiovascular disease risk stratification  EXAM: Coronary Calcium  Score  TECHNIQUE: A gated, non-contrast computed tomography scan of the heart was performed using 3mm slice thickness. Axial images were analyzed on a dedicated workstation. Calcium  scoring of the coronary arteries was performed using the Agatston method.  FINDINGS: Coronary arteries: Normal origins.  Coronary Calcium  Score: 0  Pericardium: Normal.  Ascending Aorta: Normal caliber.  Non-cardiac: See separate report from [facility].  IMPRESSION: Coronary calcium  score of 0. This was 1st percentile for age-, race-, and sex-matched controls.  RECOMMENDATIONS: Coronary artery calcium  (CAC) score is a strong predictor of incident coronary heart disease (CHD) and provides predictive information beyond traditional risk factors. CAC scoring is reasonable to use in the decision to withhold, postpone, or initiate statin therapy in intermediate-risk or selected borderline-risk asymptomatic adults (age 45-75 years and LDL-C >=70 to <190 mg/dL) who do not have diabetes or established atherosclerotic cardiovascular disease (ASCVD).* In intermediate-risk (10-year ASCVD risk >=7.5% to <20%)  adults or selected borderline-risk (10-year ASCVD risk >=5% to <7.5%) adults in whom a CAC score is measured for the purpose of making a treatment decision the following recommendations have been made:  If CAC=0, it  is reasonable to withhold statin therapy and reassess in 5 to 10 years, as long as higher risk conditions are absent (diabetes mellitus, family history of premature CHD in first degree relatives (males <55 years; females <65 years), cigarette smoking, or LDL >=190 mg/dL).  If CAC is 1 to 99, it is reasonable to initiate statin therapy for patients >=74 years of age.  If CAC is >=100 or >=75th percentile, it is reasonable to initiate statin therapy at any age.  Cardiology referral should be considered for patients with CAC scores >=400 or >=75th percentile.  *2018 AHA/ACC/AACVPR/AAPA/ABC/ACPM/ADA/AGS/APhA/ASPC/NLA/PCNA Guideline on the Management of Blood Cholesterol: A Report of the American College of Cardiology/American Heart Association Task Force on Clinical Practice Guidelines. J Am Coll Cardiol. 2019;73(24):3168-3209.  Annabella Scarce, MD  Electronically Signed: By: Annabella Scarce M.D. On: 09/26/2022 23:51   CT SCANS  CT CORONARY MORPH W/CTA COR W/SCORE 01/28/2018  Addendum 01/28/2018  4:03 PM ADDENDUM REPORT: 01/28/2018 16:01  CLINICAL DATA:  Chest pain  EXAM: Cardiac CTA  MEDICATIONS: Sub lingual nitro.  4mg  x 2 and lopressor  5mg  IV  TECHNIQUE: The patient was scanned on a Siemens 192 slice scanner. Gantry rotation speed was 250 msecs. Collimation was 0.6 mm. A 100 kV prospective scan was triggered in the ascending thoracic aorta at 35-75% of the R-R interval. Average HR during the scan was 60 bpm. The 3D data set was interpreted on a dedicated work station using MPR, MIP and VRT modes. A total of 80cc of contrast was used.  FINDINGS: Non-cardiac: See separate report from [facility].  There is an aneurysm of the membranous  ventricular septum that bulges into the RV outflow tract, it is about 1.5 cm deep. There does not appear to be a connection between LV and RV (no contrast extravasation). This is not a sinus of Valsalva aneurysm (it is proximal to aortic valve in the membranous ventricular septum).  Calcium  Score: 0 Agatston units.  Coronary Arteries: Right dominant with no anomalies  LM: No plaque or stenosis.  LAD system: No plaque or stenosis.  Circumflex system: No plaque or stenosis.  RCA system: No plaque or stenosis.  IMPRESSION: 1. Aneurysm of the membranous ventricular septum bulging into the RVOT, there does not appear to be a an actual VSD. No sinus of Valsalva aneurysm.  2. Coronary artery calcium  score 0 Agatston units, suggesting low risk for future cardiac events.  3.  No plaque or stenosis noted in the coronary tree.  Dalton Mclean   Electronically Signed By: Ezra Shuck M.D. On: 01/28/2018 16:01  Narrative EXAM: OVER-READ INTERPRETATION  CT CHEST  The following report is an over-read performed by radiologist Dr. Franky Crease of [facility] on 01/28/2018. This over-read does not include interpretation of cardiac or coronary anatomy or pathology. The coronary CTA interpretation by the cardiologist is attached.  COMPARISON:  None.  FINDINGS: Vascular: Heart is normal size.  Visualized aorta is normal caliber.  Mediastinum/Nodes: No adenopathy in the lower mediastinum or hila.  Lungs/Pleura: Visualized lungs clear.  No effusions.  Upper Abdomen: Imaging into the upper abdomen shows no acute findings.  Musculoskeletal: Chest wall soft tissues are unremarkable. No acute bony abnormality.  IMPRESSION: No acute or significant extracardiac abnormality.  Electronically Signed: By: Franky Crease M.D. On: 01/28/2018 10:32   CARDIAC MRI  MR CARDIAC MORPHOLOGY W WO CONTRAST 09/13/2013  Narrative CLINICAL DATA:  Abnormal echo?  Sinus of valsalva  aneurysm  EXAM: CARDIAC MRI  TECHNIQUE: The patient  was scanned on a 1.5 Tesla GE magnet. A dedicated cardiac coil was used. Functional imaging was done using Fiesta sequences. 2,3, and 4 chamber views were done to assess for RWMA's. Modified Simpson's rule using a short axis stack was used to calculate an ejection fraction on a dedicated work [employer]. The patient received 25 cc of Multihance . After 10 minutes inversion recovery sequences were used to assess for infiltration and scar tissue. After gadolinium injection aortic MRA was done with 3D reconstruction  CONTRAST:  25 cc multihance   FINDINGS: All 4 cardiac chambers were normal in size and function There was no RV or RA enlargement. There was no hypertrophy The aortic, mitral and tricuspid valves were normal. There was no pericardial effusion. There was a defect in the most superior aspect of the membranous ventricular septum There appeared to be and aneurysm protruding into the RV outflow tract. This did not involve the aortic annulus or sinuses. It was clearly in the LVOT and involved the membranous septum  The aortic valve itself was trileaflet and normal The 3 aortic sinuses were symmetrical, non dilated and measured 2.8 cm. There was no AR.  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 with no apparent systolic flow or shunting  2) Normal sinus of valsalva 2.8 cm symmetrical in all 3 sinuses with no AR and normal trileaflet AV  3) Normal RA/LA/RV and LV EF  57%  4) No hyperenhancement on delayed gadolinium images  5) Normal aortic MRA with no aneurysm or coarctation and normal origin of the great vessels  Maude Emmer MD [facility]   Electronically Signed By: Leim Moose On: 09/13/2013 16:11   ______________________________________________________________________________________________         Risk Assessment/Calculations:             Physical Exam:   VS:  BP 118/60   Pulse 64   Ht 5' 9 (1.753 m)   Wt 113 lb 9.6 oz (51.5 kg)   SpO2 99%   BMI 16.78 kg/m    Wt Readings from Last 3 Encounters:  05/31/24 113 lb 9.6 oz (51.5 kg)  05/30/24 133 lb (60.3 kg)  05/03/24 132 lb 3.2 oz (60 kg)    GEN: Well nourished, well developed in no acute distress NECK: No JVD; No carotid bruits CARDIAC: RRR, no murmurs, rubs, gallops; radial and PT pulses 2+ bilaterally RESPIRATORY:  Clear to auscultation without rales, wheezing or rhonchi  ABDOMEN: Soft, non-tender, non-distended EXTREMITIES:  No edema; No deformity   ASSESSMENT AND PLAN: .    Lightheadedness - etiology unclear, suspect hypotension-mediated; cannot rule out valvular or structural disease, however exam findings unremarkable; echo 11/2022 from outside source: LVEF 65-70%, no WMA, borderline MVP Will update echocardiogram Carotid duplex 04/17/23 no carotid stenosis, no amaurosis fugax, will not repeat testing. Start midodrine 2.5mg  TID prn for SBP <90 and symptomatic with lightheadedness Encouraged compression stockings or abdominal binder Encouraged adequate hydration aiming for at least 64 oz fluid intake per day  Chest pain in adult - CTA  09/2022 coronary calcium  score of 0. No recurrent chest pain. No indication for further workup.   Aortic atherosclerosis - 03/2024: LDL 108; ideal LDL goal <70; prefers to avoid medications if possible; plan to repeat NMR lipoprofile February 2026. Consider zetia or nexletol based on restuls.    Palpitations - EKG today: SR with isolated  PAC @ 60bpm; 08/2022 ZIO with 6% PAC burden, brief episodes of SVT which were asymptomatic. She prefers to avoid daily medications. Given baseline bradycardia, would avoid daily AV nodal blocking agent. Continue Metoprolol  tartrate 12.5mg -25mg  PRN for palpitations with rare use.    Venous insufficiency -  bilateral LE with varicose veins. No edema on exam. Encouraged compression stocking use, leg elevation.          Dispo: follow up in 6-8 weeks  Signed, Reche GORMAN Finder, NP

## 2024-05-31 NOTE — Patient Instructions (Addendum)
 Medication Instructions:   START Midodrine 2.5 mg by mouth 3 times daily as needed     Testing/Procedures:  Your physician has requested that you have an echocardiogram. Echocardiography is a painless test that uses sound waves to create images of your heart. It provides your doctor with information about the size and shape of your heart and how well your heart's chambers and valves are working. This procedure takes approximately one hour. There are no restrictions for this procedure. Please do NOT wear cologne, perfume, aftershave, or lotions (deodorant is allowed). Please arrive 15 minutes prior to your appointment time.  Please note: We ask at that you not bring children with you during ultrasound (echo/ vascular) testing. Due to room size and safety concerns, children are not allowed in the ultrasound rooms during exams. Our front office staff cannot provide observation of children in our lobby area while testing is being conducted. An adult accompanying a patient to their appointment will only be allowed in the ultrasound room at the discretion of the ultrasound technician under special circumstances. We apologize for any inconvenience.    Follow-Up: Please follow up in _6-8 weeks with Reche Finder, NP_     Special Instructions:    Aim for 64-80 oz of fluid per day Consider knee high compression stockings during the day (15-42mmHg) to help prevent BP from dropping with position changes Make position changes slowly to prevent lightheadedness

## 2024-06-01 ENCOUNTER — Other Ambulatory Visit

## 2024-06-02 ENCOUNTER — Encounter: Payer: Self-pay | Admitting: Podiatry

## 2024-06-02 ENCOUNTER — Encounter (HOSPITAL_BASED_OUTPATIENT_CLINIC_OR_DEPARTMENT_OTHER): Admitting: Physical Therapy

## 2024-06-02 ENCOUNTER — Ambulatory Visit: Admitting: Podiatry

## 2024-06-02 ENCOUNTER — Ambulatory Visit: Payer: Self-pay | Admitting: Internal Medicine

## 2024-06-02 VITALS — Ht 69.0 in | Wt 113.6 lb

## 2024-06-02 DIAGNOSIS — M7751 Other enthesopathy of right foot: Secondary | ICD-10-CM

## 2024-06-02 DIAGNOSIS — M79674 Pain in right toe(s): Secondary | ICD-10-CM

## 2024-06-02 DIAGNOSIS — B351 Tinea unguium: Secondary | ICD-10-CM | POA: Diagnosis not present

## 2024-06-02 DIAGNOSIS — M79675 Pain in left toe(s): Secondary | ICD-10-CM

## 2024-06-02 DIAGNOSIS — M722 Plantar fascial fibromatosis: Secondary | ICD-10-CM | POA: Diagnosis not present

## 2024-06-02 LAB — T3, FREE: T3, Free: 3.1 pg/mL (ref 2.3–4.2)

## 2024-06-02 LAB — TSH: TSH: 1.9 m[IU]/L (ref 0.40–4.50)

## 2024-06-02 LAB — T4, FREE: Free T4: 1.1 ng/dL (ref 0.8–1.8)

## 2024-06-03 NOTE — Telephone Encounter (Signed)
 FYI

## 2024-06-04 NOTE — Progress Notes (Signed)
 Subjective: Chief Complaint  Patient presents with   Ankle Pain    Pt is here to f/u on the right ankle due to tendonitis, she states everything is better, recently got and MRI and has the results to discuss.     68 year old female presents the above concerns.  She states that overall her ankle has been feeling better.  She was doing physical therapy she continues with good supportive shoe gear.  She follows primarily with Dr. Kit at [facility] for this issue. No recent injuries or changes.   Her nails are also thickened discolored has difficulty trimming them and they cause discomfort.  No open lesions.    Previously followed up with [facility] vein specialist for venous insufficiency.   Objective: AAO x3, NAD DP/PT pulses palpable bilaterally, CRT less than 3 seconds There is no significant edema to the ankle.  Some slight discomfort along the medial band of plantar fascia. There is no along the course of the peroneal tendon.  Clinically the tendons appear to I am unable to proceed any area pinpoint tenderness.  There is equal color, temperature bilaterally no evidence of CRPS.  Nails are hypertrophic, dystrophic, brittle, discolored, elongated 10. No surrounding redness or drainage. Tenderness nails 1-5 bilaterally. No open lesions or pre-ulcerative lesions are identified today. No pain with calf compression, swelling, warmth, erythema  Assessment: Plantar fasciitis, tendinitis; symptomatic onychomycosis  Plan: Plan fasciitis, tendinitis - Overall symptoms are improving.  Continue with home stretching/rehab exercises as well as supportive shoe gear.  I reviewed the MRI that she brought in today as well.  Possible future surgical intervention but do not anticipate any surgical intervention.  Also they mention RSD but clinically I do not think she had any cellulitis which we discussed.  Symptomatic onychomycosis - Sharply debrided nails x 10 without any complications or  bleeding.  Return in about 9 weeks (around 08/04/2024) for nali trim.  Donnice JONELLE Fees DPM

## 2024-06-06 ENCOUNTER — Ambulatory Visit (HOSPITAL_COMMUNITY)
Admission: RE | Admit: 2024-06-06 | Discharge: 2024-06-06 | Disposition: A | Discharge status: Home / Self Care | Source: Ambulatory Visit | Attending: Family | Admitting: Family

## 2024-06-06 DIAGNOSIS — R42 Dizziness and giddiness: Secondary | ICD-10-CM | POA: Diagnosis present

## 2024-06-06 DIAGNOSIS — I059 Rheumatic mitral valve disease, unspecified: Secondary | ICD-10-CM | POA: Insufficient documentation

## 2024-06-06 DIAGNOSIS — I491 Atrial premature depolarization: Secondary | ICD-10-CM | POA: Diagnosis present

## 2024-06-09 ENCOUNTER — Encounter (HOSPITAL_BASED_OUTPATIENT_CLINIC_OR_DEPARTMENT_OTHER): Admitting: Physical Therapy

## 2024-06-13 ENCOUNTER — Telehealth (INDEPENDENT_AMBULATORY_CARE_PROVIDER_SITE_OTHER): Payer: Self-pay | Admitting: Otolaryngology

## 2024-06-13 NOTE — Telephone Encounter (Signed)
 Patient called and LVM stating that she feels her ETD has gotten worse and would like to come in this week.  Please advise.

## 2024-06-14 ENCOUNTER — Encounter (HOSPITAL_BASED_OUTPATIENT_CLINIC_OR_DEPARTMENT_OTHER): Payer: Self-pay

## 2024-06-14 ENCOUNTER — Ambulatory Visit (HOSPITAL_BASED_OUTPATIENT_CLINIC_OR_DEPARTMENT_OTHER): Payer: Self-pay | Admitting: Family

## 2024-06-14 DIAGNOSIS — R931 Abnormal findings on diagnostic imaging of heart and coronary circulation: Secondary | ICD-10-CM

## 2024-06-14 DIAGNOSIS — Q211 Atrial septal defect, unspecified: Secondary | ICD-10-CM

## 2024-06-14 LAB — ECHOCARDIOGRAM COMPLETE
Area-P 1/2: 4.23 cm2
S' Lateral: 2.4 cm

## 2024-06-15 ENCOUNTER — Ambulatory Visit (INDEPENDENT_AMBULATORY_CARE_PROVIDER_SITE_OTHER)

## 2024-06-15 DIAGNOSIS — Q211 Atrial septal defect, unspecified: Secondary | ICD-10-CM

## 2024-06-15 DIAGNOSIS — R931 Abnormal findings on diagnostic imaging of heart and coronary circulation: Secondary | ICD-10-CM

## 2024-06-16 ENCOUNTER — Encounter (HOSPITAL_BASED_OUTPATIENT_CLINIC_OR_DEPARTMENT_OTHER): Payer: Self-pay

## 2024-06-16 ENCOUNTER — Encounter (HOSPITAL_BASED_OUTPATIENT_CLINIC_OR_DEPARTMENT_OTHER): Admitting: Physical Therapy

## 2024-06-16 ENCOUNTER — Ambulatory Visit (HOSPITAL_BASED_OUTPATIENT_CLINIC_OR_DEPARTMENT_OTHER): Payer: Self-pay | Admitting: Family

## 2024-06-16 ENCOUNTER — Telehealth (HOSPITAL_BASED_OUTPATIENT_CLINIC_OR_DEPARTMENT_OTHER): Payer: Self-pay | Admitting: Family

## 2024-06-16 LAB — ECHOCARDIOGRAM LIMITED BUBBLE STUDY

## 2024-06-16 NOTE — Telephone Encounter (Signed)
 Pt called concerned about her echo bubble. She understands that a provider has not read or signed off on it but would like a call and also to set up an appt sooner than later. Please call cell phone number per her request.

## 2024-06-16 NOTE — Telephone Encounter (Signed)
 Spoke with patient and advised will need to await Dr Vertie recommendations prior to scheduling follow up  Patient appreciated call but state she was a bit anxious

## 2024-06-17 NOTE — Telephone Encounter (Signed)
 Addressed via separate MyChart message.   Octivia Canion S Norvin Ohlin, NP

## 2024-06-20 ENCOUNTER — Ambulatory Visit: Admitting: Podiatry

## 2024-06-22 ENCOUNTER — Ambulatory Visit (INDEPENDENT_AMBULATORY_CARE_PROVIDER_SITE_OTHER): Admitting: Otolaryngology

## 2024-06-22 ENCOUNTER — Encounter (INDEPENDENT_AMBULATORY_CARE_PROVIDER_SITE_OTHER): Payer: Self-pay | Admitting: Otolaryngology

## 2024-06-22 VITALS — BP 102/65 | HR 73 | Temp 98.3°F | Ht 69.0 in | Wt 134.0 lb

## 2024-06-22 DIAGNOSIS — H6983 Other specified disorders of Eustachian tube, bilateral: Secondary | ICD-10-CM | POA: Diagnosis not present

## 2024-06-22 DIAGNOSIS — J310 Chronic rhinitis: Secondary | ICD-10-CM

## 2024-06-22 DIAGNOSIS — R519 Headache, unspecified: Secondary | ICD-10-CM | POA: Diagnosis not present

## 2024-06-22 DIAGNOSIS — R0981 Nasal congestion: Secondary | ICD-10-CM | POA: Diagnosis not present

## 2024-06-22 DIAGNOSIS — J320 Chronic maxillary sinusitis: Secondary | ICD-10-CM

## 2024-06-23 ENCOUNTER — Ambulatory Visit: Admitting: Podiatry

## 2024-06-23 DIAGNOSIS — J320 Chronic maxillary sinusitis: Secondary | ICD-10-CM | POA: Insufficient documentation

## 2024-06-23 DIAGNOSIS — R519 Headache, unspecified: Secondary | ICD-10-CM | POA: Insufficient documentation

## 2024-06-23 NOTE — Progress Notes (Signed)
 Follow up: Eustachian tube dysfunction, chronic rhinitis, recurrent sinusitis  Discussed the use of AI scribe software for clinical note transcription with the patient, who gave verbal consent to proceed.  History of Present Illness Megan [name] is a 68 year old female who presents with persistent ear clogging and associated symptoms.  She experiences persistent ear clogging, primarily affecting the left ear, which has progressively worsened. The ear pain radiates down her neck, and she occasionally feels a swollen lymph node in the area. She also experiences dizziness. Despite trying various allergy medications, including Allegra, Claritin , and Azelastine, she has not found relief. She attempted to use Flonase but discontinued it due to burning, headaches, and tingling sensations. No other steroid nasal sprays have been used.  Additional symptoms include headaches, unusual sensations in her eyes, and dental pain. Her teeth hurt, but she does not experience nasal discharge. Attempts to relieve ear pressure using the Valsalva maneuver result in pressure without any popping sensation.  She has a history of sinus surgery on the right side and has noticed facial asymmetry, with the left side appearing larger. She is concerned about a potential root canal on the same side, which she believes might be contributing to her symptoms.   Exam: General: Communicates without difficulty, well nourished, no acute distress. Head: Normocephalic, no evidence injury, no tenderness, facial buttresses intact without stepoff. Face/sinus: No tenderness to palpation and percussion. Facial movement is normal and symmetric. Eyes: PERRL, EOMI. No scleral icterus, conjunctivae clear. Neuro: CN II exam reveals vision grossly intact.  No nystagmus at any point of gaze. Ears: Auricles well formed without lesions.  Ear canals are intact without mass or lesion.  No erythema or edema is appreciated.  The TMs are intact  without fluid. Nose: External evaluation reveals normal support and skin without lesions.  Dorsum is intact.  Anterior rhinoscopy reveals congested mucosa over anterior aspect of inferior turbinates and intact septum.  No purulence noted. Oral:  Oral cavity and oropharynx are intact, symmetric, without erythema or edema.  Mucosa is moist without lesions. Neck: Full range of motion without pain.  There is no significant lymphadenopathy.  No masses palpable.  Thyroid  bed within normal limits to palpation.  Parotid glands and submandibular glands equal bilaterally without mass.  Trachea is midline. Neuro:  CN 2-12 grossly intact.    Assessment and Plan Assessment & Plan Bilateral eustachian tube dysfunction Chronic bilateral eustachian tube dysfunction with worsening symptoms, including left ear pain radiating down the neck, dizziness, and headache. No visible infection or fluid in the ear canal or eardrum. Previous use of Flonase was not tolerated due to headache and burning sensation. She prefers to avoid surgical options like eustachian tuboplasty. - Recommend trial of different steroid nasal sprays such as Nasonex, Nasocort, or Rhinocort. - Instruct to perform Valsalva exercise 20-30 times a day. - Discussed potential surgical options like eustachian tuboplasty and ear tube placement, but she prefers non-surgical management.  Chronic rhinitis with nasal mucosal congestion, recurrent sinusitis Chronic rhinitis with nasal mucosal congestion. Previous use of Flonase was not tolerated. Other antihistamines like Allegra and Claritin  have been tried without significant improvement in eustachian tube dysfunction. - Recommend trial of different steroid nasal sprays such as Nasonex, Nasocort, or Rhinocort.  Dental issues Potential root canal needed on the left side, which may contribute to ear pain due to referred pain from dental infections. - Advise to address dental issues as they may contribute to her  facial pain.  Facial swelling,  left side Reports left-sided facial swelling, possibly related to sinusitis. Consideration of underlying sinus issues or dental problems contributing to asymmetry. - Order sinus CT scan to evaluate for chronic sinusitis.

## 2024-06-23 NOTE — Telephone Encounter (Signed)
 I spoke with Crystaline @ [facility] 05/25/24 at 1138 am to inquire about referral sent by our office on 05/09/24. Per Crystaline, referral was received and was under review.  Called today and received notice that [facility] has declined to take patient since they are at capacity. Per [facility], letter sent 06/01/24 to inform our office. Advised we did not receive this information. Will make Dr Albertus aware of [facility] decision.

## 2024-06-24 ENCOUNTER — Encounter: Payer: Self-pay | Admitting: Physical Therapy

## 2024-06-24 ENCOUNTER — Ambulatory Visit: Attending: Internal Medicine | Admitting: Physical Therapy

## 2024-06-24 VITALS — BP 106/60 | HR 83

## 2024-06-24 DIAGNOSIS — R42 Dizziness and giddiness: Secondary | ICD-10-CM | POA: Insufficient documentation

## 2024-06-24 NOTE — Therapy (Signed)
 OUTPATIENT PHYSICAL THERAPY VESTIBULAR EVALUATION     Patient Name: Megan [name] MRN: [id number] DOB:1955-10-18, 68 y.o., female Today's Date: 06/24/2024  END OF SESSION:  PT End of Session - 06/24/24 1451     Visit Number 1    Number of Visits 5    Date for Recertification  07/24/24    Authorization Type MEDICARE PART A AND B    PT Start Time 1449    PT Stop Time 1533    PT Time Calculation (min) 44 min    Activity Tolerance Patient tolerated treatment well    Behavior During Therapy WFL for tasks assessed/performed          Past Medical History:  Diagnosis Date   Allergic rhinitis    Allergy    Aneurysm of cardiac wall, congenital    a.  septal aneurysm extending into the RVOT (by cardiac MRI in 08/2013)   Anxiety    Aortic atherosclerosis    Arthritis    Basal cell carcinoma    Depression    Heart murmur    Hypothyroidism    on meds   Leukopenia    idiopathic   Lyme disease    Mitral valve prolapse    MVP (mitral valve prolapse)    Orthostatic hypotension    Osteoporosis    Pancreatic cyst    Peripheral neuropathy 06/09/2012   Pernicious anemia    Plantar fasciitis    Pneumothorax    TMJ (dislocation of temporomandibular joint)    Past Surgical History:  Procedure Laterality Date   COLONOSCOPY  2019   JMP-   COLONOSCOPY  05/2023   COLONOSCOPY WITH PROPOFOL  N/A 02/22/2013   Procedure: COLONOSCOPY WITH PROPOFOL ;  Surgeon: Gladis MARLA Louder, MD;  Location: [facility];  Service: Endoscopy;  Laterality: N/A;   HYSTEROSCOPY WITH D & C N/A 12/24/2023   Procedure: DILATATION AND CURETTAGE /HYSTEROSCOPY;  Surgeon: Mat Browning, MD;  Location: [facility];  Service: Gynecology;  Laterality: N/A;  NO MYOSURE   MOUTH SURGERY  01/2012   bone graft in mouth   NASAL SINUS SURGERY Right 06/12/2014   Procedure: RIGHT ENDOSCOPIC MAXILLARY ANTROSTOMY;  Surgeon: Ana LELON Moccasin, MD;  Location: [facility];  Service: ENT;  Laterality: Right;   ROOT CANAL   02/27/2012   TONSILLECTOMY  1962   WISDOM TOOTH EXTRACTION     Patient Active Problem List   Diagnosis Date Noted   Facial pain 06/23/2024   Chronic maxillary sinusitis 06/23/2024   Other specified disorders of eustachian tube, bilateral 05/25/2024   Chronic rhinitis 05/25/2024   Asthma 06/15/2023   Burning sensation of mouth 10/10/2022   Pelvic pain 10/10/2022   Bloating 10/10/2022   COVID-19 virus antibody detected 07/01/2022   Major depressive disorder, single episode, unspecified 01/10/2021   Abnormality of tongue 08/14/2020   Anxiety state 08/14/2020   Dizziness 08/14/2020   Granuloma annulare 08/14/2020   Hardening of the aorta (main artery of the heart) 08/14/2020   History of gastrointestinal disease 08/14/2020   Hypercholesterolemia 08/14/2020   Hypoglycemia 08/14/2020   Ocular rosacea 08/14/2020   Osteoporosis 08/14/2020   Primary osteoarthritis, left ankle and foot 08/14/2020   Sinus of Valsalva aneurysm 08/14/2020   Skin cancer 08/14/2020   Skin sensation disturbance 08/14/2020   Bilateral ankle joint pain 12/02/2019   Lyme disease 10/24/2019   Hypotrichosis 07/28/2019   Iron deficiency anemia 07/14/2019   Other proteinuria 12/01/2018   Pernicious anemia 09/08/2018   Vitamin D  deficiency, unspecified  07/30/2018   Age-related osteoporosis without current pathological fracture 07/30/2018   Hypogammaglobulinemia 01/11/2018   Right hip pain 12/30/2017   Bilateral ankle pain 11/09/2017   Hypothyroidism 10/27/2017   Hair loss 04/25/2017   Neuropathic pain of left lower extremity 04/22/2017   Great toe pain, left 04/10/2017   Precordial chest pain 12/17/2016   Aneurysm of right ventricle of heart 12/17/2016   Chronic fatigue 03/05/2016   Neuropathy associated with MGUS 12/26/2013   Orthostatic hypotension 08/01/2013   Mitral valve prolapse 08/01/2013   Spider veins of limb 02/15/2013   Leukopenia 05/13/2012   Bilateral Dorsal Foot pain 04/23/2011   Gait  abnormality 04/23/2011   Leg length inequality 04/23/2011   Nonspecific (abnormal) findings on radiological and other examination of body structure 07/24/2008   ABNORMAL CHEST XRAY 07/24/2008    PCP: Dwight Trula SQUIBB, MD REFERRING PROVIDER: Dwight Trula SQUIBB, MD  REFERRING DIAG: R42 (ICD-10-CM) - Dizziness and giddiness  THERAPY DIAG:  Dizziness and giddiness  ONSET DATE: 06/16/2024  Rationale for Evaluation and Treatment: Rehabilitation  SUBJECTIVE:   SUBJECTIVE STATEMENT: About 2 months ago was sleeping and when she woke up she felt like she was on an amusement park ride. Notes that this lasted for a long time. It happened another 2 times and was not as bad. Has a sensation on and off where the room isn't spinning but she feels off balance. Sometimes will be driving and feels like she shouldn't be driving. Does not drive long distances. Feels a little off right now. She has seen her PCP. Went to her ENT and he said it was her eustachian tube and needs to take Flonase. Went back to the ENT and thinks that she has a sinus thing going on, but does not think this is the cause of her dizziness. Did both sides of the Epley maneuver and thinks it was making things worse. Has low BP that she has had her whole life, when she gets up too quickly will feel some lightheadedness and that is different from what she normally has. Dr. Karis ordered a CT scan to evaluate for chronic sinusitis.  Pt accompanied by: self  PERTINENT HISTORY: PMH: Bilateral eustachian tube dysfunction, persistent ear clogging, anxiety, depression, osteoporosis, peripheral neuropathy  PAIN:  Are you having pain? Yes: NPRS scale: 2/10 Pain location: headache, both sides of her head and top of head   Vitals:   06/24/24 1506  BP: 106/60  Pulse: 83      PRECAUTIONS: None   FALLS: Has patient fallen in last 6 months? No  LIVING ENVIRONMENT: Lives with: lives alone Lives in: [place] Stairs: Yes, has stairs inside and  outside, can hold onto the railing  Has following equipment at home: None  PLOF: Independent Still driving in town distances   PATIENT GOALS: Wants to figure out why she is dizzy, doesn't want to be dizzy anymore   OBJECTIVE:  Note: Objective measures were completed at Evaluation unless otherwise noted.   COGNITION: Overall cognitive status: Within functional limits for tasks assessed    POSTURE:  No Significant postural limitations  Cervical ROM:   WFL   GAIT: Gait pattern: WFL Distance walked: Clinic distances  Assistive device utilized: None Level of assistance: Complete Independence   VESTIBULAR ASSESSMENT:  GENERAL OBSERVATION: Ambulates in with no AD independently.   SYMPTOM BEHAVIOR:  Subjective history: See above  Non-Vestibular symptoms: headaches  Type of dizziness: spatial episodes + feeling off  Frequency: Daily, can happen for 3-4 hours  and go away   Duration: Can happen for a few hours   Aggravating factors: Spontaneous and sometimes will be driving and it happens or being on her computer   Relieving factors: time   Progression of symptoms: better  OCULOMOTOR EXAM:  Ocular Alignment: normal  Ocular ROM: No Limitations  Spontaneous Nystagmus: absent  Gaze-Induced Nystagmus: absent  Smooth Pursuits: intact and felt like she was having double vision all the way to the R   Saccades: intact  VESTIBULAR - OCULAR REFLEX:   Slow VOR: Normal  VOR Cancellation: Normal, when stopping feels a little like she is still moving   Head-Impulse Test: HIT Right: negative HIT Left: positive after 2nd rep  Not dizzy during,  but did feel off afterwards   Dynamic Visual Acuity: Static: Line 9 Dynamic: Line 3 6 line difference, moderate dizziness during, needs steadying assist from PT afterwards    POSITIONAL TESTING: Right Dix-Hallpike: no nystagmus and feels spatially worse when coming back up  Left Dix-Hallpike: no nystagmus and feels more spatially weird when  coming back up, more on this side  Right Roll Test: no nystagmus Left Roll Test: no nystagmus Right Sidelying: no nystagmus and felt something was spatially off with her eyes  Left Sidelying: no nystagmus and feels a little bit off balance, feels it behind her eyes right now, when coming back up, feels something is spatially off with her eyes      M-CTSIB  Condition 1: Firm Surface, EO 30 Sec, Normal Sway  Condition 2: Firm Surface, EC 30 Sec, Normal Sway  Condition 3: Foam Surface, EO 30 Sec, Normal Sway  Condition 4: Foam Surface, EC 30 Sec, Mild Sway      Gait with head turns over 20' - pt reporting some fuzziness/blurriness when going through midline, but no unsteadiness   Gait wit head nods over 20' - pt with no issues or imbalance                                                                                                                             TREATMENT DATE: 06/24/24  Self-Care: Educated in regards to clinical findings and what PT will address with VOR and motion sensitivities. Discussed unsure of cause of pt's dizziness, but pt demonstrating a vestibular hypofunction. Pt negative for BPPV and does not need to perform the Epley   PATIENT EDUCATION: Education details: See above Person educated: Patient Education method: Explanation Education comprehension: verbalized understanding  HOME EXERCISE PROGRAM:  GOALS: Goals reviewed with patient? Yes  SHORT TERM GOALS: ALL STGS = LTGS   LONG TERM GOALS: Target date: 07/22/2024  DHI to be assessed with LTG written.  Baseline:  Goal status: INITIAL  2.  Pt will perform DVA in 3 line difference or less with mild dizziness in order to demo improved VOR.  Baseline:6 line difference, moderate dizziness   Goal status: INITIAL    ASSESSMENT:  CLINICAL IMPRESSION: Patient is a  68 year old female referred to Neuro OPPT for dizziness.   Pt's PMH is significant for: : Bilateral eustachian tube dysfunction, persistent  ear clogging, anxiety, depression, osteoporosis, peripheral neuropathy. Pt reports dizziness episodes and spatially feels off. Reports these dizziness episodes will happen spontaneously like when pt is driving. The following deficits were present during the exam: positive HIT to the L and 6 line difference on DVA indicating impaired VOR. Pt also having slightly more sway with condition 4 of mCTSIB. It appears that pt has a vestibular hypofunction, but unsure of cause. Pt negative for BPPV. Pt would benefit from skilled PT to address these impairments and functional limitations to maximize functional mobility independence and decr dizziness.   OBJECTIVE IMPAIRMENTS: decreased activity tolerance and dizziness.   ACTIVITY LIMITATIONS: bed mobility and locomotion level  PARTICIPATION LIMITATIONS: driving and community activity  PERSONAL FACTORS: Past/current experiences, Time since onset of injury/illness/exacerbation, and 3+ comorbidities: : Bilateral eustachian tube dysfunction, persistent ear clogging, anxiety, depression, osteoporosis, peripheral neuropathy are also affecting patient's functional outcome.   REHAB POTENTIAL: Good  CLINICAL DECISION MAKING: Evolving/moderate complexity  EVALUATION COMPLEXITY: Moderate   PLAN:  PT FREQUENCY: 1x/week  PT DURATION: 4 weeks  PLANNED INTERVENTIONS: 97164- PT Re-evaluation, 97110-Therapeutic exercises, 97530- Therapeutic activity, 97112- Neuromuscular re-education, 97535- Self Care, [id number]- Manual therapy, [phone]- Gait training, Patient/Family education, Balance training, and Vestibular training  PLAN FOR NEXT SESSION: give DHI and write goal. Initiate HEP for VOR, wokr on balance with head turns    Sheffield LOISE Senate, PT, DPT 06/24/2024, 4:15 PM

## 2024-06-27 ENCOUNTER — Ambulatory Visit
Admission: RE | Admit: 2024-06-27 | Discharge: 2024-06-27 | Disposition: A | Source: Ambulatory Visit | Attending: Otolaryngology | Admitting: Otolaryngology

## 2024-06-27 ENCOUNTER — Encounter: Payer: Self-pay | Admitting: Physical Therapy

## 2024-06-27 ENCOUNTER — Ambulatory Visit: Attending: Internal Medicine | Admitting: Physical Therapy

## 2024-06-27 DIAGNOSIS — R42 Dizziness and giddiness: Secondary | ICD-10-CM | POA: Diagnosis present

## 2024-06-27 NOTE — Therapy (Addendum)
 OUTPATIENT PHYSICAL THERAPY VESTIBULAR TREATMENT     Patient Name: Megan [name] MRN: [id number] DOB:[birth year], 68 y.o., female Today's Date: 06/27/2024  END OF SESSION:  PT End of Session - 06/27/24 0717     Visit Number 2    Number of Visits 5    Date for Recertification  07/24/24    Authorization Type MEDICARE PART A AND B    PT Start Time 0716    PT Stop Time 0756    PT Time Calculation (min) 40 min    Activity Tolerance Patient tolerated treatment well    Behavior During Therapy [facility] for tasks assessed/performed          Past Medical History:  Diagnosis Date   Allergic rhinitis    Allergy    Aneurysm of cardiac wall, congenital    a.  septal aneurysm extending into the RVOT (by cardiac MRI in 08/2013)   Anxiety    Aortic atherosclerosis    Arthritis    Basal cell carcinoma    Depression    Heart murmur    Hypothyroidism    on meds   Leukopenia    idiopathic   Lyme disease    Mitral valve prolapse    MVP (mitral valve prolapse)    Orthostatic hypotension    Osteoporosis    Pancreatic cyst    Peripheral neuropathy 06/09/2012   Pernicious anemia    Plantar fasciitis    Pneumothorax    TMJ (dislocation of temporomandibular joint)    Past Surgical History:  Procedure Laterality Date   COLONOSCOPY  2019   JMP-   COLONOSCOPY  05/2023   COLONOSCOPY WITH PROPOFOL  N/A 02/22/2013   Procedure: COLONOSCOPY WITH PROPOFOL ;  Surgeon: Gladis MARLA Louder, MD;  Location: [facility];  Service: Endoscopy;  Laterality: N/A;   HYSTEROSCOPY WITH D & C N/A 12/24/2023   Procedure: DILATATION AND CURETTAGE /HYSTEROSCOPY;  Surgeon: Mat Browning, MD;  Location: [facility];  Service: Gynecology;  Laterality: N/A;  NO MYOSURE   MOUTH SURGERY  01/2012   bone graft in mouth   NASAL SINUS SURGERY Right 06/12/2014   Procedure: RIGHT ENDOSCOPIC MAXILLARY ANTROSTOMY;  Surgeon: Ana LELON Moccasin, MD;  Location: [facility];  Service: ENT;  Laterality: Right;   ROOT CANAL   02/27/2012   TONSILLECTOMY  1962   WISDOM TOOTH EXTRACTION     Patient Active Problem List   Diagnosis Date Noted   Facial pain 06/23/2024   Chronic maxillary sinusitis 06/23/2024   Other specified disorders of eustachian tube, bilateral 05/25/2024   Chronic rhinitis 05/25/2024   Asthma 06/15/2023   Burning sensation of mouth 10/10/2022   Pelvic pain 10/10/2022   Bloating 10/10/2022   COVID-19 virus antibody detected 07/01/2022   Major depressive disorder, single episode, unspecified 01/10/2021   Abnormality of tongue 08/14/2020   Anxiety state 08/14/2020   Dizziness 08/14/2020   Granuloma annulare 08/14/2020   Hardening of the aorta (main artery of the heart) 08/14/2020   History of gastrointestinal disease 08/14/2020   Hypercholesterolemia 08/14/2020   Hypoglycemia 08/14/2020   Ocular rosacea 08/14/2020   Osteoporosis 08/14/2020   Primary osteoarthritis, left ankle and foot 08/14/2020   Sinus of Valsalva aneurysm 08/14/2020   Skin cancer 08/14/2020   Skin sensation disturbance 08/14/2020   Bilateral ankle joint pain 12/02/2019   Lyme disease 10/24/2019   Hypotrichosis 07/28/2019   Iron deficiency anemia 07/14/2019   Other proteinuria 12/01/2018   Pernicious anemia 09/08/2018   Vitamin D  deficiency, unspecified  07/30/2018   Age-related osteoporosis without current pathological fracture 07/30/2018   Hypogammaglobulinemia 01/11/2018   Right hip pain 12/30/2017   Bilateral ankle pain 11/09/2017   Hypothyroidism 10/27/2017   Hair loss 04/25/2017   Neuropathic pain of left lower extremity 04/22/2017   Great toe pain, left 04/10/2017   Precordial chest pain 12/17/2016   Aneurysm of right ventricle of heart 12/17/2016   Chronic fatigue 03/05/2016   Neuropathy associated with MGUS 12/26/2013   Orthostatic hypotension 08/01/2013   Mitral valve prolapse 08/01/2013   Spider veins of limb 02/15/2013   Leukopenia 05/13/2012   Bilateral Dorsal Foot pain 04/23/2011   Gait  abnormality 04/23/2011   Leg length inequality 04/23/2011   Nonspecific (abnormal) findings on radiological and other examination of body structure 07/24/2008   ABNORMAL CHEST XRAY 07/24/2008    PCP: Dwight Trula SQUIBB, MD REFERRING PROVIDER: Dwight Trula SQUIBB, MD  REFERRING DIAG: R42 (ICD-10-CM) - Dizziness and giddiness  THERAPY DIAG:  Dizziness and giddiness  ONSET DATE: 06/16/2024  Rationale for Evaluation and Treatment: Rehabilitation  SUBJECTIVE:   SUBJECTIVE STATEMENT: Has been taking Flonase. Will be getting a CT this Wednesday for potential chronic sinusitis.   Pt accompanied by: self  PERTINENT HISTORY: PMH: Bilateral eustachian tube dysfunction, persistent ear clogging, anxiety, depression, osteoporosis, peripheral neuropathy  PAIN:  Are you having pain? Yes: NPRS scale: 1/10 Pain location: headache, both sides of her head and top of head   There were no vitals filed for this visit.   PRECAUTIONS: None   FALLS: Has patient fallen in last 6 months? No  LIVING ENVIRONMENT: Lives with: lives alone Lives in: [place] Stairs: Yes, has stairs inside and outside, can hold onto the railing  Has following equipment at home: None  PLOF: Independent Still driving in town distances   PATIENT GOALS: Wants to figure out why she is dizzy, doesn't want to be dizzy anymore   OBJECTIVE:  Note: Objective measures were completed at Evaluation unless otherwise noted.   COGNITION: Overall cognitive status: Within functional limits for tasks assessed    POSTURE:  No Significant postural limitations  Cervical ROM:   WFL   GAIT: Gait pattern: WFL Distance walked: Clinic distances  Assistive device utilized: None Level of assistance: Complete Independence   VESTIBULAR ASSESSMENT:  GENERAL OBSERVATION: Ambulates in with no AD independently.   SYMPTOM BEHAVIOR:  Subjective history: See above  Non-Vestibular symptoms: headaches  Type of dizziness: spatial episodes  + feeling off  Frequency: Daily, can happen for 3-4 hours and go away   Duration: Can happen for a few hours   Aggravating factors: Spontaneous and sometimes will be driving and it happens or being on her computer   Relieving factors: time   Progression of symptoms: better  OCULOMOTOR EXAM:  Ocular Alignment: normal  Ocular ROM: No Limitations  Spontaneous Nystagmus: absent  Gaze-Induced Nystagmus: absent  Smooth Pursuits: intact and felt like she was having double vision all the way to the R   Saccades: intact  VESTIBULAR - OCULAR REFLEX:   Slow VOR: Normal  VOR Cancellation: Normal, when stopping feels a little like she is still moving   Head-Impulse Test: HIT Right: negative HIT Left: positive after 2nd rep  Not dizzy during,  but did feel off afterwards   Dynamic Visual Acuity: Static: Line 9 Dynamic: Line 3 6 line difference, moderate dizziness during, needs steadying assist from PT afterwards    POSITIONAL TESTING: Right Dix-Hallpike: no nystagmus and feels spatially worse when coming back  up  Left Dix-Hallpike: no nystagmus and feels more spatially weird when coming back up, more on this side  Right Roll Test: no nystagmus Left Roll Test: no nystagmus Right Sidelying: no nystagmus and felt something was spatially off with her eyes  Left Sidelying: no nystagmus and feels a little bit off balance, feels it behind her eyes right now, when coming back up, feels something is spatially off with her eyes      M-CTSIB  Condition 1: Firm Surface, EO 30 Sec, Normal Sway  Condition 2: Firm Surface, EC 30 Sec, Normal Sway  Condition 3: Foam Surface, EO 30 Sec, Normal Sway  Condition 4: Foam Surface, EC 30 Sec, Mild Sway      Gait with head turns over 20' - pt reporting some fuzziness/blurriness when going through midline, but no unsteadiness   Gait wit head nods over 20' - pt with no issues or imbalance                                                                                                                              TREATMENT DATE: 06/27/24  Therapeutic Activity: DHI: 30/100 = Mild handicap in regards to handicap   Gaze Adaptation: x1 Viewing Horizontal: Position: Standing, Time: 30 seconds, Reps: 3, and Comment: feels something when coming back to the middle, moderate sx when incr speed, notes it gets a little blurrier when going to the L, 3rd rep felt a little better  x1 Viewing Vertical:  Position: Standing, Time: 30 seconds, Reps: 1, and Comment: no sx in this direction   On air ex: Narrow BOS EC 2 x 10 reps head turns, 2 x 10 reps head nods, mild postural sway, needing intermittent UE support, improved balance with reps Feet together EO 2 x 10 reps head turns (felt a little off when stopping), 10 reps head nods  With feet apart: holding ball and making CW/CCW, felt more off balance in CCW, 2 x 10 reps of each direction, needing intermittent taps to wall for balance, feels more off when going to the L side    PATIENT EDUCATION: Education details: Initial HEP for vestibular input for balance and VOR Person educated: Patient Education method: Programmer, Multimedia, Demonstration, Verbal cues, and Handouts Education comprehension: verbalized understanding and returned demonstration  HOME EXERCISE PROGRAM: Standing VOR x 1 in horizontal direction   Access Code: [email or web address] URL: [email or web address] Date: 06/27/2024 Prepared by: Sheffield Senate  Program Notes On a foam pad/cushion in the corner: holding ball and making clockwise/counterclockwise circles, have space between feet, perform 2 sets of 10 reps   Exercises - Narrow Stance with Eyes Closed and Head Rotation on Foam Pad  - 1-2 x daily - 5 x weekly - 2 sets - 10 reps  GOALS: Goals reviewed with patient? Yes  SHORT TERM GOALS: ALL STGS = LTGS   LONG TERM GOALS: Target date: 07/22/2024  Pt will decr DHI to a 20 or less in order  to demo improved sx with dizziness  Baseline:  30/100 Goal status: INITIAL  2.  Pt will perform DVA in 3 line difference or less with mild dizziness in order to demo improved VOR.  Baseline:6 line difference, moderate dizziness   Goal status: INITIAL    ASSESSMENT:  CLINICAL IMPRESSION: Pt filled out the [facility] and scored a 30/100, indicating a mild handicap for dizziness. Updated LTG as appropriate. Remainder of session focused on initiating HEP for standing VOR and balance with EC/head motions. With VOR in standing, pt reporting mild/mod sx in the horizontal direction, and none in the vertical direction. With balance tasks with holding and tracking with ball, pt more challenged with balance when going in the CCW direction. Pt did improve with incr reps and practice. Will continue per POC.   OBJECTIVE IMPAIRMENTS: decreased activity tolerance and dizziness.   ACTIVITY LIMITATIONS: bed mobility and locomotion level  PARTICIPATION LIMITATIONS: driving and community activity  PERSONAL FACTORS: Past/current experiences, Time since onset of injury/illness/exacerbation, and 3+ comorbidities: : Bilateral eustachian tube dysfunction, persistent ear clogging, anxiety, depression, osteoporosis, peripheral neuropathy are also affecting patient's functional outcome.   REHAB POTENTIAL: Good  CLINICAL DECISION MAKING: Evolving/moderate complexity  EVALUATION COMPLEXITY: Moderate   PLAN:  PT FREQUENCY: 1x/week  PT DURATION: 4 weeks  PLANNED INTERVENTIONS: 97164- PT Re-evaluation, 97110-Therapeutic exercises, 97530- Therapeutic activity, [id number]- Neuromuscular re-education, 97535- Self Care, [id number]- Manual therapy, [phone]- Gait training, Patient/Family education, Balance training, and Vestibular training  PLAN FOR NEXT SESSION: how was HEP? Progress VOR, work on balance with head turns    Sheffield LOISE Senate, PT, DPT 06/27/2024, 7:57 AM

## 2024-06-27 NOTE — Patient Instructions (Signed)
 Gaze Stabilization: Standing Feet Apart    Feet shoulder width apart, keeping eyes on target on wall __a few __ feet away, tilt head down 15-30 and move head side to side for __30__ seconds.   Perform 3 reps Do __2-3__ sessions per day.  Start on a plain background, when that gets easier can use a patterned background Copyright  [employer]. All rights reserved.

## 2024-06-29 ENCOUNTER — Other Ambulatory Visit

## 2024-07-01 ENCOUNTER — Ambulatory Visit (HOSPITAL_BASED_OUTPATIENT_CLINIC_OR_DEPARTMENT_OTHER): Admitting: Physical Therapy

## 2024-07-07 ENCOUNTER — Encounter: Payer: Self-pay | Admitting: Internal Medicine

## 2024-07-08 ENCOUNTER — Encounter (HOSPITAL_BASED_OUTPATIENT_CLINIC_OR_DEPARTMENT_OTHER): Admitting: Physical Therapy

## 2024-07-08 ENCOUNTER — Ambulatory Visit: Admitting: Physical Therapy

## 2024-07-11 ENCOUNTER — Ambulatory Visit: Admitting: Physical Therapy

## 2024-07-11 ENCOUNTER — Telehealth (INDEPENDENT_AMBULATORY_CARE_PROVIDER_SITE_OTHER): Payer: Self-pay | Admitting: Otolaryngology

## 2024-07-11 NOTE — Telephone Encounter (Signed)
 Per Dr. Karis, I told patient that her sinus CT showed normal sinuses, normal ears, no ENT abnormality, bilateral TMJ arthritis. She will f/u on 08/04/24.

## 2024-07-15 ENCOUNTER — Encounter (HOSPITAL_BASED_OUTPATIENT_CLINIC_OR_DEPARTMENT_OTHER): Admitting: Physical Therapy

## 2024-07-18 ENCOUNTER — Ambulatory Visit: Admitting: Physical Therapy

## 2024-07-25 NOTE — Progress Notes (Unsigned)
 Cardiology Office Note:  .   Date:  07/26/2024  ID:  Megan [name], DOB 01/16/1956, MRN [id number] PCP: Megan Trula SQUIBB, MD  [facility] Providers Cardiologist:  Megan Shallow, MD    History of Present Illness: Megan [name] is a 68 y.o. female  with a hx of aneurysm of membranous ventricular septum, thyroid  disease, aortic atherosclerosis, HLD, PFO, mild MVP.   Prior MRA 08/2013 aneurysmal dilation of membranous ventricular septum extending into RV outflow tract with no sinus of valsalva aneurysm. Carotid duplex 03/2015 normal. Echo 12/2016 normal LVEF, mild MR. Stress echo 09/2017 normal. CTA 01/2018 aneurysm of membranous ventricular septum that bulges into RV outflow tract. No connection between LV and RV. No sinus of valsalva aneurysm. Calcium  score 0. ABIs 03/2019 revealed noncompressible LLE artery and normal right. 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 Monitor 01/2020 due to palpitations SR, SB, ST with rare PAC/PVC and brief PAT. ABIs 09/2020 normal.    Seen 05/08/22 noting occasional chest discomfort at rest or with activity that was atypical for angina not recommended for further workup.    Seen 08/27/22 for chest pain, palpitations. ZIO and CTA ordered. Monitor with predominantly NSR, average rate 70 bpm, 6% PAC burden. Toprol  25mg  QD added. CTA scheduled for 09/26/22.However she was nervous about pre-medications for CTA and was instead scheduled for coronary calcium  score which was 0.   Echo 11/2022 at outside provider LVEF 65-70%, no RWMA, RV normal, borderline MVP, trace TR. Carotid duplex 03/2023 with no stenosis.   Seen 03/2024, NMR panel 03/31/24 (LDL particle number 918, LDL 108, HDL 77, triglycerides 42, cholesterol 193, LDL size 21). She preferred lifestyle changes and NMR in 6 months.   At visit 05/31/24 noted >1 month history of lightheadedness, no frank syncope. SBP as low as  80s. Midodrine  2.5mg  TID PRN initiated for SBP <90. Compression stockings, increased hydration encouraged. Plan was made for repeat NMR lipoprofile 09/2024. Echo 06/06/24 with normal LVEF 70-75%, no RWMA, normal diastolic function, RV mildly enlarged, normal PASP, moderate LAE, type 1R atrial septal aneurysm, borderline late mitral valve systolic bileaflet prolapse. Subsequent bubble study 06/15/24 confirmed PFO, no further evaluation recommended by Dr. Shallow.  Presents today for follow-up independently.  Notes pending possible ankle surgery with Dr. Kit though may trial course of meloxicam prior.  Since completing vestibular therapy for eustachian tube disorder her dizziness has improved.  She is no longer having significant hypotension has not required her as needed midodrine .  She notes upcoming dental work with potential implants.  Reviewed echocardiogram and with mild mitral valve prolapse would not require SBE prophylaxis.  She remains very active including spin classes, weight training with exercise tolerance greater than 4 METS.  Her palpitations remain infrequent and not bothersome.  She does note some dietary indiscretion and is more recently requiring soft diet due to dental issues.  She inquires about rechecking cholesterol.  ROS: Please see the history of present illness.    All other systems reviewed and are negative.  Studies Reviewed: .       Cardiac Studies & Procedures   ______________________________________________________________________________________________   STRESS TESTS  ECHOCARDIOGRAM STRESS TEST 10/20/2017  Narrative *[name]* [address] [place], [place] [address] [phone]  ------------------------------------------------------------------- Stress Echocardiography  Patient:    Megan, [name] MR #:       [id number] Study Date: 10/20/2017 Gender:  F Age:        4 Height:     175.3 cm Weight:     60 kg BSA:        1.7 m^2 Pt.  Status: Room:  [facility] Signa Rush [id number] ORDERING     Megan, Shona [name] REFERRING    Megan, Shona [name] SONOGRAPHER  Megan [name], RDCS PERFORMING   [facility], Outpatient ATTENDING    Megan Scarce, MD  cc:  -------------------------------------------------------------------  ------------------------------------------------------------------- Indications:      R07.9 Chest pain.  ------------------------------------------------------------------- History:   PMH:  Asthma. MVP. Hypothyroidism.  Medications:  No other medications.  ------------------------------------------------------------------- Study Conclusions  - Stress ECG conclusions: There were no stress arrhythmias or conduction abnormalities. The stress ECG was normal. - Staged echo: There was no echocardiographic evidence for stress-induced ischemia.  Impressions:  - Normal stress echo.  ------------------------------------------------------------------- Study data:   Study status:  Routine.  Consent:  The risks, benefits, and alternatives to the procedure were explained to the patient and informed consent was obtained.  Procedure:  The patient reported no pain pre or post test. Initial setup. The patient was brought to the laboratory. A baseline ECG was recorded. Surface ECG leads and automatic cuff blood pressure measurements were monitored. Treadmill exercise testing was performed using the Bruce protocol. The patient exercised for 11 min, to protocol stage 4, to a maximal work rate of 13.4 mets. Exercise was terminated due to achievement of target heart rate, patient request, and fatigue. The patient was positioned for image acquisition and recovery monitoring. Transthoracic stress echocardiography for chest pain evaluation. Image quality was adequate. Images were captured at baseline and peak exercise.  Study completion:  The patient tolerated the procedure well. There were no  complications. Bruce protocol. Stress echocardiography.  Birthdate:  Patient birthdate: 1955/10/01.  Age:  Patient is 68 yr old.  Sex:  Gender: female.    BMI: 19.5 kg/m^2.  Blood pressure:     110/71  Patient status:  Outpatient.  Study date:  Study date: 10/20/2017. Study time: 07:45 AM.  -------------------------------------------------------------------  ------------------------------------------------------------------- Baseline ECG:  Normal.  Normal sinus rhythm.  ------------------------------------------------------------------- Stress protocol:  +---------------------+---+-----------+---------+ !Stage                !HR !BP (mmHg)  !Symptoms ! +---------------------+---+-----------+---------+ !Baseline             !64 !100/70 (80)!None     ! +---------------------+---+-----------+---------+ !Stage 1              !90 !151/71 (98)!None     ! +---------------------+---+-----------+---------+ !Stage 2              !890!867/31 (89)!None     ! +---------------------+---+-----------+---------+ !Stage 3              !126!152/68 (96)!Fatigue  ! +---------------------+---+-----------+---------+ !Stage 4              !150!-----------!Fatigue  ! +---------------------+---+-----------+---------+ !Immediate post stress!148!124/63 (83)!Subsiding! +---------------------+---+-----------+---------+ !Recovery; 1 min      !111!-----------!None     ! +---------------------+---+-----------+---------+ !Recovery; 2 min      !89 !-----------!None     ! +---------------------+---+-----------+---------+ !Recovery; 3 min      !83 !107/58 (74)!None     ! +---------------------+---+-----------+---------+ !Recovery; 4 min      !78 !-----------!None     ! +---------------------+---+-----------+---------+ !Recovery; 5 min      !76 !-----------!None     ! +---------------------+---+-----------+---------+ !Late recovery        !79 !103/57 (  72)!None      ! +---------------------+---+-----------+---------+  ------------------------------------------------------------------- Stress results:   Maximal heart rate during stress was 150 bpm (95% of maximal predicted heart rate). The maximal predicted heart rate was 158 bpm.The target heart rate was achieved. The heart rate response to stress was normal. There was a normal resting blood pressure with an appropriate response to stress. The rate-pressure product for the peak heart rate and blood pressure was 80847 mm Hg/min.  The patient experienced no chest pain during stress.  ------------------------------------------------------------------- Stress ECG:  There were no stress arrhythmias or conduction abnormalities.  The stress ECG was normal.  ------------------------------------------------------------------- Baseline:  Rest:  - LV size was normal. - LV global systolic function was normal. The estimated LV ejection fraction was 60-65%. - Normal wall motion; no LV regional wall motion abnormalities.  - RV size was normal. - There were no RV regional wall motion abnormalities.  Stress:  - LV size was normal. - LV global systolic function was vigorous and appropriately augmented from baseline. The estimated LV ejection fraction was 65-70%. - Normal wall motion; no LV regional wall motion abnormalities.  ------------------------------------------------------------------- Stress echo results:     Left ventricular ejection fraction was normal at rest and with stress. There was no echocardiographic evidence for stress-induced ischemia.  ------------------------------------------------------------------- Prepared and Electronically Authenticated by  Megan Scarce, MD 2019-02-26T14:39:59   ECHOCARDIOGRAM  ECHOCARDIOGRAM LIMITED BUBBLE STUDY 06/15/2024  Narrative ECHOCARDIOGRAM LIMITED REPORT    Patient Name:   Megan [name] Date of Exam: 06/15/2024 Medical Rec #:   [id number]       Height:       69.0 in Accession #:    [id number]      Weight:       113.6 lb Date of Birth:  09/27/1955       BSA:          1.625 m Patient Age:    68 years        BP:           100/60 mmHg Patient Gender: F               HR:           69 bpm. Exam Location:  Outpatient  Procedure: Limited Echo, Saline Contrast Bubble Study and Cardiac Doppler (Both Spectral and Color Flow Doppler were utilized during procedure).  Indications:    Atrial septal defect [773312] Abnormal echocardiogram  History:        Patient has prior history of Echocardiogram examinations, most recent 06/06/2024. Risk Factors:Non-Smoker and Dyslipidemia. MVP; Aneurysm of right ventricle.  Sonographer:    Orvil Holmes RDCS Referring Phys: [id number] Zyan Coby S Elric Tirado  IMPRESSIONS   1. Left ventricular ejection fraction, by estimation, is 60 to 65%. The left ventricle has normal function. The left ventricle has no regional wall motion abnormalities. 2. Right ventricular systolic function is normal. The right ventricular size is normal. 3. The mitral valve is normal in structure. Mild mitral valve regurgitation. No evidence of mitral stenosis. There is mild late systolic prolapse of both leaflets of the mitral valve. 4. The aortic valve is normal in structure. Aortic valve regurgitation is not visualized. No aortic stenosis is present. 5. The inferior vena cava is normal in size with greater than 50% respiratory variability, suggesting right atrial pressure of 3 mmHg. 6. Agitated saline contrast bubble study was positive with shunting observed within 3-6 cardiac cycles suggestive of interatrial shunt.  FINDINGS Left Ventricle: Left ventricular ejection fraction,  by estimation, is 60 to 65%. The left ventricle has normal function. The left ventricle has no regional wall motion abnormalities. The left ventricular internal cavity size was normal in size. There is no left ventricular hypertrophy.  Right  Ventricle: The right ventricular size is normal. No increase in right ventricular wall thickness. Right ventricular systolic function is normal.  Left Atrium: Left atrial size was normal in size.  Right Atrium: Right atrial size was normal in size. Prominent Eustachian valve.  Pericardium: There is no evidence of pericardial effusion.  Mitral Valve: The mitral valve is normal in structure. There is mild late systolic prolapse of both leaflets of the mitral valve. Mild mitral valve regurgitation. No evidence of mitral valve stenosis.  Tricuspid Valve: The tricuspid valve is normal in structure. Tricuspid valve regurgitation is not demonstrated. No evidence of tricuspid stenosis.  Aortic Valve: The aortic valve is normal in structure. Aortic valve regurgitation is not visualized. No aortic stenosis is present.  Pulmonic Valve: The pulmonic valve was normal in structure. Pulmonic valve regurgitation is trivial. No evidence of pulmonic stenosis.  Aorta: The aortic root is normal in size and structure.  Venous: The inferior vena cava is normal in size with greater than 50% respiratory variability, suggesting right atrial pressure of 3 mmHg.  IAS/Shunts: The interatrial septum is aneurysmal. No atrial level shunt detected by color flow Doppler. Agitated saline contrast was given intravenously to evaluate for intracardiac shunting. Agitated saline contrast bubble study was positive with shunting observed within 3-6 cardiac cycles suggestive of interatrial shunt.  Jerel Croitoru MD Electronically signed by Jerel Balding MD Signature Date/Time: 06/16/2024/12:50:50 PM    Final    MONITORS  LONG TERM MONITOR (3-14 DAYS) 09/17/2022  Narrative Patch Wear Time:  13 days and 0 hours (2024-01-03T15:31:02-499 to 2024-01-16T16:14:30-0500)  Patient had a min HR of 48 bpm, max HR of 156 bpm, and avg HR of 70 bpm. Predominant underlying rhythm was Sinus Rhythm. Slight P wave morphology changes  were noted. 2 Ventricular Tachycardia runs occurred, the run with the fastest interval lasting 4 beats with a max rate of 156 bpm, the longest lasting 7 beats with an avg rate of 114 bpm. 8 Supraventricular Tachycardia runs occurred, the run with the fastest interval lasting 5 beats with a max rate of 138 bpm, the longest lasting 6 beats with an avg rate of 97 bpm. Some episodes of Supraventricular Tachycardia may be possible Atrial Tachycardia with variable block. Isolated SVEs were frequent (6.1%, 74502), SVE Couplets were rare (<1.0%, 4170), and SVE Triplets were rare (<1.0%, 364). Isolated VEs were rare (<1.0%, 3986), VE Couplets were rare (<1.0%, 9), and VE Triplets were rare (<1.0%, 1). Ventricular Bigeminy was present.  Sinus rhythm with PACs, brief PAT, PVCs and nonsustained ventricular tachycardia (4 and 6 beats). Megan [name]   CT SCANS  CT CARDIAC SCORING (SELF PAY ONLY) 09/25/2022  Addendum 09/28/2022  1:23 AM ADDENDUM REPORT: 09/28/2022 01:21  EXAM: OVER-READ INTERPRETATION  CT CHEST  The following report is an over-read performed by radiologist Dr. Franky Leff [facility] on 09/28/2022. This over-read does not include interpretation of cardiac or coronary anatomy or pathology. The coronary calcium  score interpretation by the cardiologist is attached.  COMPARISON:  04/11/2022  FINDINGS: Heart is normal size. Aorta normal caliber. No adenopathy. No confluent opacities or effusions. No acute findings in the upper abdomen. Chest wall soft tissues are unremarkable. No acute bony abnormality.  IMPRESSION: No acute or significant extracardiac abnormality.   Electronically  Signed By: Franky Crease M.D. On: 09/28/2022 01:21  Narrative CLINICAL DATA:  51F for cardiovascular disease risk stratification  EXAM: Coronary Calcium  Score  TECHNIQUE: A gated, non-contrast computed tomography scan of the heart was performed using 3mm slice thickness. Axial images  were analyzed on a dedicated workstation. Calcium  scoring of the coronary arteries was performed using the Agatston method.  FINDINGS: Coronary arteries: Normal origins.  Coronary Calcium  Score: 0  Pericardium: Normal.  Ascending Aorta: Normal caliber.  Non-cardiac: See separate report from [facility].  IMPRESSION: Coronary calcium  score of 0. This was 1st percentile for age-, race-, and sex-matched controls.  RECOMMENDATIONS: Coronary artery calcium  (CAC) score is a strong predictor of incident coronary heart disease (CHD) and provides predictive information beyond traditional risk factors. CAC scoring is reasonable to use in the decision to withhold, postpone, or initiate statin therapy in intermediate-risk or selected borderline-risk asymptomatic adults (age 55-75 years and LDL-C >=70 to <190 mg/dL) who do not have diabetes or established atherosclerotic cardiovascular disease (ASCVD).* In intermediate-risk (10-year ASCVD risk >=7.5% to <20%) adults or selected borderline-risk (10-year ASCVD risk >=5% to <7.5%) adults in whom a CAC score is measured for the purpose of making a treatment decision the following recommendations have been made:  If CAC=0, it is reasonable to withhold statin therapy and reassess in 5 to 10 years, as long as higher risk conditions are absent (diabetes mellitus, family history of premature CHD in first degree relatives (males <55 years; females <65 years), cigarette smoking, or LDL >=190 mg/dL).  If CAC is 1 to 99, it is reasonable to initiate statin therapy for patients >=55 years of age.  If CAC is >=100 or >=75th percentile, it is reasonable to initiate statin therapy at any age.  Cardiology referral should be considered for patients with CAC scores >=400 or >=75th percentile.  *2018 AHA/ACC/AACVPR/AAPA/ABC/ACPM/ADA/AGS/APhA/ASPC/NLA/PCNA Guideline on the Management of Blood Cholesterol: A Report of the American College of  Cardiology/American Heart Association Task Force on Clinical Practice Guidelines. J Am Coll Cardiol. 2019;73(24):3168-3209.  Megan Scarce, MD  Electronically Signed: By: Megan [name] M.D. On: 09/26/2022 23:51   CT SCANS  CT CORONARY MORPH W/CTA COR W/SCORE 01/28/2018  Addendum 01/28/2018  4:03 PM ADDENDUM REPORT: 01/28/2018 16:01  CLINICAL DATA:  Chest pain  EXAM: Cardiac CTA  MEDICATIONS: Sub lingual nitro.  4mg  x 2 and lopressor  5mg  IV  TECHNIQUE: The patient was scanned on a Siemens 192 slice scanner. Gantry rotation speed was 250 msecs. Collimation was 0.6 mm. A 100 kV prospective scan was triggered in the ascending thoracic aorta at 35-75% of the R-R interval. Average HR during the scan was 60 bpm. The 3D data set was interpreted on a dedicated work station using MPR, MIP and VRT modes. A total of 80cc of contrast was used.  FINDINGS: Non-cardiac: See separate report from [facility].  There is an aneurysm of the membranous ventricular septum that bulges into the RV outflow tract, it is about 1.5 cm deep. There does not appear to be a connection between LV and RV (no contrast extravasation). This is not a sinus of Valsalva aneurysm (it is proximal to aortic valve in the membranous ventricular septum).  Calcium  Score: 0 Agatston units.  Coronary Arteries: Right dominant with no anomalies  LM: No plaque or stenosis.  LAD system: No plaque or stenosis.  Circumflex system: No plaque or stenosis.  RCA system: No plaque or stenosis.  IMPRESSION: 1. Aneurysm of the membranous ventricular septum bulging into the  RVOT, there does not appear to be a an actual VSD. No sinus of Valsalva aneurysm.  2. Coronary artery calcium  score 0 Agatston units, suggesting low risk for future cardiac events.  3.  No plaque or stenosis noted in the coronary tree.  Dalton Mclean   Electronically Signed By: Ezra Shuck M.D. On: 01/28/2018  16:01  Narrative EXAM: OVER-READ INTERPRETATION  CT CHEST  The following report is an over-read performed by radiologist Dr. Franky Crease of [facility] on 01/28/2018. This over-read does not include interpretation of cardiac or coronary anatomy or pathology. The coronary CTA interpretation by the cardiologist is attached.  COMPARISON:  None.  FINDINGS: Vascular: Heart is normal size.  Visualized aorta is normal caliber.  Mediastinum/Nodes: No adenopathy in the lower mediastinum or hila.  Lungs/Pleura: Visualized lungs clear.  No effusions.  Upper Abdomen: Imaging into the upper abdomen shows no acute findings.  Musculoskeletal: Chest wall soft tissues are unremarkable. No acute bony abnormality.  IMPRESSION: No acute or significant extracardiac abnormality.  Electronically Signed: By: Franky Crease M.D. On: 01/28/2018 10:32     ______________________________________________________________________________________________          Risk Assessment/Calculations:             Physical Exam:   VS:  BP 104/68 (BP Location: Right Arm, Patient Position: Sitting, Cuff Size: Normal)   Pulse 74   Ht 5' 9 (1.753 m)   Wt 134 lb 6.4 oz (61 kg)   SpO2 98%   BMI 19.85 kg/m    Wt Readings from Last 3 Encounters:  07/26/24 134 lb 6.4 oz (61 kg)  06/22/24 134 lb (60.8 kg)  06/02/24 113 lb 9.6 oz (51.5 kg)    GEN: Well nourished, well developed in no acute distress NECK: No JVD; No carotid bruits CARDIAC: RRR, gr 1-2/6 systolic murmur in L axillary region.  no rubs, gallops; radial and PT pulses 2+ bilaterally RESPIRATORY:  Clear to auscultation without rales, wheezing or rhonchi  ABDOMEN: Soft, non-tender, non-distended EXTREMITIES:  No edema; No deformity   ASSESSMENT AND PLAN: .    Chest pain in adult - CTA 09/2022 coronary calcium  score of 0. No recurrent chest pain. No indication for further workup.  Preop - exercise tolerance > 4 METS. Per AHA/ACC  guidelines, she is deemed acceptable risk for the planned procedure without additional cardiovascular testing. Will route to Dr. Kit and team so they are aware.  She has mild MVP which does not require SBE prophylaxis. She is not on antiplatelet nor anticoagulant that would need held.    Aortic atherosclerosis - 03/2024: LDL 108; ideal LDL goal <70; prefers to avoid medications if possible. Consider zetia or nexletol, handouts previously provided. Discussed lack of significant benefit of red yeast rice. Update NMR lipoprofile today and in 3 months.  MVP / PFO - mild MVP and PFO noted by echo 05/2024. Normal LVEF. Soft murmur on exam. Consider repeat echo in 3-5 years for monitoring. No indication for PFO closure.    Palpitations - 08/2022 ZIO with 6% PAC burden, brief episodes of SVT which were asymptomatic. She prefers to avoid daily medications. Given baseline bradycardia, would avoid daily AV nodal blocking agent. Continue Metoprolol  tartrate 12.5mg -25mg  PRN for palpitations with rare use. Her palpitations are infrequent and not bothersome at this time.   Venous insufficiency -  bilateral LE with varicose veins. No edema on exam. Encouraged compression stocking use, leg elevation.  Dispo: follow up 10/2024 with NMR lipoprofile 1 week prior  Signed, Reche GORMAN Finder, NP

## 2024-07-26 ENCOUNTER — Ambulatory Visit (INDEPENDENT_AMBULATORY_CARE_PROVIDER_SITE_OTHER): Admitting: Family

## 2024-07-26 ENCOUNTER — Encounter (HOSPITAL_BASED_OUTPATIENT_CLINIC_OR_DEPARTMENT_OTHER): Payer: Self-pay | Admitting: Family

## 2024-07-26 VITALS — BP 104/68 | HR 74 | Ht 69.0 in | Wt 134.4 lb

## 2024-07-26 DIAGNOSIS — I341 Nonrheumatic mitral (valve) prolapse: Secondary | ICD-10-CM

## 2024-07-26 DIAGNOSIS — I700 Atherosclerosis of aorta: Secondary | ICD-10-CM

## 2024-07-26 DIAGNOSIS — Z01810 Encounter for preprocedural cardiovascular examination: Secondary | ICD-10-CM

## 2024-07-26 DIAGNOSIS — E782 Mixed hyperlipidemia: Secondary | ICD-10-CM | POA: Diagnosis not present

## 2024-07-26 NOTE — Patient Instructions (Signed)
 Medication Instructions:  Continue your current medications  *If you need a refill on your cardiac medications before your next appointment, please call your pharmacy*  Lab Work: Your physician recommends that you return for lab work today: NMR lipoprofile  Your physician recommends that you return for lab work in March 2026: NMR lipoprofile  If you have labs (blood work) drawn today and your tests are completely normal, you will receive your results only by: MyChart Message (if you have MyChart) OR A paper copy in the mail If you have any lab test that is abnormal or we need to change your treatment, we will call you to review the results.  Follow-Up: At [facility], you and your health needs are our priority.  As part of our continuing mission to provide you with exceptional heart care, our providers are all part of one team.  This team includes your primary Cardiologist (physician) and Advanced Practice Providers or APPs (Physician Assistants and Nurse Practitioners) who all work together to provide you with the care you need, when you need it.  Your next appointment:   10/2024 with Reche GORMAN Finder, NP   We recommend signing up for the patient portal called MyChart.  Sign up information is provided on this After Visit Summary.  MyChart is used to connect with patients for Virtual Visits (Telemedicine).  Patients are able to view lab/test results, encounter notes, upcoming appointments, etc.  Non-urgent messages can be sent to your provider as well.   To learn more about what you can do with MyChart, go to [email or web address].

## 2024-07-27 ENCOUNTER — Ambulatory Visit (HOSPITAL_BASED_OUTPATIENT_CLINIC_OR_DEPARTMENT_OTHER): Payer: Self-pay | Admitting: Family

## 2024-07-27 DIAGNOSIS — E782 Mixed hyperlipidemia: Secondary | ICD-10-CM

## 2024-07-27 DIAGNOSIS — I700 Atherosclerosis of aorta: Secondary | ICD-10-CM

## 2024-07-27 LAB — NMR, LIPOPROFILE
Cholesterol, Total: 201 mg/dL — ABNORMAL HIGH (ref 100–199)
HDL Particle Number: 37 umol/L (ref >=30.5)
HDL-C: 76 mg/dL (ref >39)
LDL Particle Number: 979 nmol/L (ref <1000)
LDL Size: 21.2 nm (ref >20.5)
LDL-C (NIH Calc): 115 mg/dL — ABNORMAL HIGH (ref 0–99)
LP-IR Score: <25 (ref <=45)
Small LDL Particle Number: <90 nmol/L (ref <=527)
Triglycerides: 55 mg/dL (ref 0–149)

## 2024-07-28 ENCOUNTER — Ambulatory Visit: Admitting: Internal Medicine

## 2024-07-29 ENCOUNTER — Encounter (HOSPITAL_BASED_OUTPATIENT_CLINIC_OR_DEPARTMENT_OTHER): Admitting: Physical Therapy

## 2024-08-04 ENCOUNTER — Encounter (INDEPENDENT_AMBULATORY_CARE_PROVIDER_SITE_OTHER): Payer: Self-pay | Admitting: Otolaryngology

## 2024-08-04 ENCOUNTER — Encounter: Payer: Self-pay | Admitting: Podiatry

## 2024-08-04 ENCOUNTER — Ambulatory Visit: Admitting: Podiatry

## 2024-08-04 ENCOUNTER — Ambulatory Visit (INDEPENDENT_AMBULATORY_CARE_PROVIDER_SITE_OTHER): Admitting: Otolaryngology

## 2024-08-04 VITALS — Ht 69.0 in | Wt 134.4 lb

## 2024-08-04 VITALS — BP 94/60 | HR 72 | Temp 97.9°F | Ht 69.0 in | Wt 134.0 lb

## 2024-08-04 DIAGNOSIS — H6983 Other specified disorders of Eustachian tube, bilateral: Secondary | ICD-10-CM | POA: Diagnosis not present

## 2024-08-04 DIAGNOSIS — M7751 Other enthesopathy of right foot: Secondary | ICD-10-CM | POA: Diagnosis not present

## 2024-08-04 DIAGNOSIS — M79674 Pain in right toe(s): Secondary | ICD-10-CM | POA: Diagnosis not present

## 2024-08-04 DIAGNOSIS — R42 Dizziness and giddiness: Secondary | ICD-10-CM | POA: Diagnosis not present

## 2024-08-04 DIAGNOSIS — R040 Epistaxis: Secondary | ICD-10-CM | POA: Insufficient documentation

## 2024-08-04 DIAGNOSIS — J310 Chronic rhinitis: Secondary | ICD-10-CM

## 2024-08-04 DIAGNOSIS — B351 Tinea unguium: Secondary | ICD-10-CM

## 2024-08-04 DIAGNOSIS — M79675 Pain in left toe(s): Secondary | ICD-10-CM | POA: Diagnosis not present

## 2024-08-04 DIAGNOSIS — J342 Deviated nasal septum: Secondary | ICD-10-CM | POA: Insufficient documentation

## 2024-08-04 NOTE — Progress Notes (Signed)
 Patient ID: Megan [name], female   DOB: [birth year], 68 y.o.   MRN: [id number]  Follow up: Chronic dizziness, eustachian tube dysfunction, recurrent sinusitis, chronic nasal congestion, epistaxis  History of Present Illness Octa Megan [name] is a 68 year old female who returns today for follow-up of her multiple medical issues.  The patient was last seen in October 2025.  At that time, she was complaining of chronic ear pressure, consistent with eustachian tube dysfunction.  She also complains of frequent facial pressure and discomfort.  She underwent a sinus CT scan.  The CT showed no significant acute or chronic sinusitis.  Septal deviation and bilateral inferior turbinate hypertrophy were noted.  She completed a two-week course of intranasal fluticasone  after her last visit, initially at a lower dose due to discomfort, then increasing to the standard dose. During this period, dizziness resolved, but she developed significant nasal dryness, leading to discontinuation of fluticasone . Following a root canal with nasal cannula sedation, she experienced three days of intermittent left-sided epistaxis, which resolved after application of intranasal coconut oil. Epistaxis has not recurred.  She continues to experience intermittent aural fullness and a sensation of Eustachian tube dysfunction, describing it as like I'm on the airplane. She denies current dizziness, vertigo, or imbalance since discontinuing fluticasone , but reports persistent aural fullness and occasional sensation of fluid drainage. She regularly performs Valsalva maneuvers to relieve ear pressure.  She attended two sessions of vestibular therapy, failing the dynamic visual acuity test. She discontinued therapy after symptomatic improvement but is considering resuming due to ongoing mild aural fullness.  Exam: General: Communicates without difficulty, well nourished, no acute distress. Head: Normocephalic, no evidence injury, no  tenderness, facial buttresses intact without stepoff. Face/sinus: No tenderness to palpation and percussion. Facial movement is normal and symmetric. Eyes: PERRL, EOMI. No scleral icterus, conjunctivae clear. Neuro: CN II exam reveals vision grossly intact.  No nystagmus at any point of gaze. Ears: Auricles well formed without lesions.  Ear canals are intact without mass or lesion.  No erythema or edema is appreciated.  The TMs are intact without fluid. Nose: External evaluation reveals normal support and skin without lesions.  Dorsum is intact.  Anterior rhinoscopy reveals congested mucosa over anterior aspect of inferior turbinates and intact septum.  No purulence noted. Oral:  Oral cavity and oropharynx are intact, symmetric, without erythema or edema.  Mucosa is moist without lesions. Neck: Full range of motion without pain.  There is no significant lymphadenopathy.  No masses palpable.  Thyroid  bed within normal limits to palpation.  Parotid glands and submandibular glands equal bilaterally without mass.  Trachea is midline. Neuro:  CN 2-12 grossly intact.    Assessment and Plan Assessment & Plan Bilateral eustachian tube dysfunction Chronic bilateral eustachian tube dysfunction with persistent aural fullness and intermittent improvement in dizziness. No evidence of acute infection, effusion, or structural abnormality on recent imaging. Symptoms fluctuate and are not associated with significant middle ear pathology. - Recommended continued use of Fluticasone  nasal spray, with option to reduce to one spray daily if two sprays are bothersome. - Advised saline nasal spray prior to Fluticasone  to improve mucosal hydration. - Discussed use of saline gel spray after Fluticasone  to maintain nasal moisture and reduce risk of epistaxis. - Encouraged regular autoinflation to support eustachian tube function.  Chronic rhinitis Chronic rhinitis with nasal mucosal congestion and recent epistaxis, likely  secondary to mucosal trauma from nasal cannula and dryness. Nasal mucosa is healed on examination. Symptoms of dryness  are likely exacerbated by environmental factors and recent procedures. - Advised continued use of Fluticasone  as needed for congestion, emphasizing proper spray technique (aiming away from septum) to minimize epistaxis risk. - Recommended saline nasal spray and/or saline gel spray to maintain mucosal hydration, especially during winter. - Approved use of coconut oil intranasally for symptomatic relief of dryness. - Instructed to use moisturizing agents as needed based on symptom severity.  Chronic dizziness, vestibular dysfunction Vestibular dysfunction with prior dizziness and abnormal dynamic visual acuity testing.  Previous vestibular therapy was discontinued prematurely; further therapy is warranted for optimal recovery. - Recommended resumption of vestibular physical therapy, both in clinic and at home, until formally discharged by therapist. - Ordered referral for [facility] physical therapy at [facility]. -The patient will return for reevaluation in 3 months, sooner if needed.

## 2024-08-09 NOTE — Progress Notes (Signed)
 Subjective: Chief Complaint  Patient presents with   Nail Problem    Pt is here for RFC.    68 year old female presents the above concerns.  She states her nails are thick and elongated she has difficulty trimming them.  No open lesions.  She does start getting some pain on the medial aspect of the ankle and posterior tibial tendon she follows with Dr. Kit at [facility] for.  She said that she may want to come for another opinion.  MRI has been scheduled.   Objective: AAO x3, NAD DP/PT pulses palpable bilaterally, CRT less than 3 seconds There is no significant edema to the ankle.  Some slight discomfort along the today along the posterior tibial tendon, flexor tendons.  She is able to do a single and double heel rise.  There is no area pinpoint tenderness.  Nails are hypertrophic, dystrophic, brittle, discolored, elongated 10. No surrounding redness or drainage. Tenderness nails 1-5 bilaterally. No open lesions or pre-ulcerative lesions are identified today. No pain with calf compression, swelling, warmth, erythema  Assessment: Plantar fasciitis, tendinitis; symptomatic onychomycosis  Plan: Tendinitis - Swelling and symptoms on the posterior tibial tendon.  Clinically she is able to do a single and double heel rise.  She did show me her report from Dr. Kit and she is scheduled for an MRI.  She said that she may have another opinion I be happy to see her after the MRI if she would like.  Symptomatic onychomycosis - Sharply debrided nails x 10 without any complications or bleeding.   Megan [name] DPM

## 2024-08-10 ENCOUNTER — Ambulatory Visit: Attending: Internal Medicine | Admitting: Physical Therapy

## 2024-08-10 ENCOUNTER — Encounter: Payer: Self-pay | Admitting: Physical Therapy

## 2024-08-10 DIAGNOSIS — R42 Dizziness and giddiness: Secondary | ICD-10-CM | POA: Insufficient documentation

## 2024-08-10 NOTE — Therapy (Signed)
 OUTPATIENT PHYSICAL THERAPY VESTIBULAR TREATMENT/RE-CERT/-DISCHARGE SUMMARY     Patient Name: Megan [name] MRN: [id number] DOB:07/06/1956, 68 y.o., female Today's Date: 08/10/2024  PHYSICAL THERAPY DISCHARGE SUMMARY  Visits from Start of Care: 3  Current functional level related to goals / functional outcomes: See LTGs/Clinical Assessment Statement   Remaining deficits: Impaired VOR based on DVA testing (also wonder if vision playing a role)   Education / Equipment: HEP   Patient agrees to discharge. Patient goals were partially met. Patient is being discharged due to pt no longer having dizziness in daily life, improved after taking Flonase. Pt does still have VOR deficits, but has been provided with HEP.    END OF SESSION:  PT End of Session - 08/10/24 1438     Visit Number 3    Number of Visits 5    Date for Recertification  09/09/24    Authorization Type MEDICARE PART A AND B    PT Start Time 1436    PT Stop Time 1505   full time not used due to D/C visit   PT Time Calculation (min) 29 min    Activity Tolerance Patient tolerated treatment well    Behavior During Therapy WFL for tasks assessed/performed          Past Medical History:  Diagnosis Date   Allergic rhinitis    Allergy    Aneurysm of cardiac wall, congenital    a.  septal aneurysm extending into the RVOT (by cardiac MRI in 08/2013)   Anxiety    Aortic atherosclerosis    Arthritis    Asthma    Basal cell carcinoma    Cataract    Depression    Heart murmur    Hypothyroidism    on meds   Leukopenia    idiopathic   Lyme disease    Mitral valve prolapse    MVP (mitral valve prolapse)    Orthostatic hypotension    Osteoporosis    Pancreatic cyst    Peripheral neuropathy 06/09/2012   Pernicious anemia    Plantar fasciitis    Pneumothorax    TMJ (dislocation of temporomandibular joint)    Past Surgical History:  Procedure Laterality Date   COLONOSCOPY  2019   JMP-   COLONOSCOPY   05/2023   COLONOSCOPY WITH PROPOFOL  N/A 02/22/2013   Procedure: COLONOSCOPY WITH PROPOFOL ;  Surgeon: Gladis MARLA Louder, MD;  Location: [facility];  Service: Endoscopy;  Laterality: N/A;   HYSTEROSCOPY WITH D & C N/A 12/24/2023   Procedure: DILATATION AND CURETTAGE /HYSTEROSCOPY;  Surgeon: Mat Browning, MD;  Location: [facility];  Service: Gynecology;  Laterality: N/A;  NO MYOSURE   MOUTH SURGERY  01/2012   bone graft in mouth   NASAL SINUS SURGERY Right 06/12/2014   Procedure: RIGHT ENDOSCOPIC MAXILLARY ANTROSTOMY;  Surgeon: Ana LELON Moccasin, MD;  Location: [facility];  Service: ENT;  Laterality: Right;   ROOT CANAL  02/27/2012   TONSILLECTOMY  1962   WISDOM TOOTH EXTRACTION     Patient Active Problem List   Diagnosis Date Noted   Deviated nasal septum 08/04/2024   Epistaxis 08/04/2024   Facial pain 06/23/2024   Chronic maxillary sinusitis 06/23/2024   Other specified disorders of eustachian tube, bilateral 05/25/2024   Chronic rhinitis 05/25/2024   Asthma 06/15/2023   Burning sensation of mouth 10/10/2022   Pelvic pain 10/10/2022   Bloating 10/10/2022   COVID-19 virus antibody detected 07/01/2022   Major depressive disorder, single episode, unspecified 01/10/2021  Abnormality of tongue 08/14/2020   Anxiety state 08/14/2020   Dizziness 08/14/2020   Granuloma annulare 08/14/2020   Hardening of the aorta (main artery of the heart) 08/14/2020   History of gastrointestinal disease 08/14/2020   Hypercholesterolemia 08/14/2020   Hypoglycemia 08/14/2020   Ocular rosacea 08/14/2020   Osteoporosis 08/14/2020   Primary osteoarthritis, left ankle and foot 08/14/2020   Sinus of Valsalva aneurysm 08/14/2020   Skin cancer 08/14/2020   Skin sensation disturbance 08/14/2020   Bilateral ankle joint pain 12/02/2019   Lyme disease 10/24/2019   Hypotrichosis 07/28/2019   Iron deficiency anemia 07/14/2019   Other proteinuria 12/01/2018   Pernicious anemia 09/08/2018   Vitamin D   deficiency, unspecified 07/30/2018   Age-related osteoporosis without current pathological fracture 07/30/2018   Hypogammaglobulinemia 01/11/2018   Right hip pain 12/30/2017   Bilateral ankle pain 11/09/2017   Hypothyroidism 10/27/2017   Hair loss 04/25/2017   Neuropathic pain of left lower extremity 04/22/2017   Great toe pain, left 04/10/2017   Precordial chest pain 12/17/2016   Aneurysm of right ventricle of heart 12/17/2016   Chronic fatigue 03/05/2016   Neuropathy associated with MGUS 12/26/2013   Orthostatic hypotension 08/01/2013   Mitral valve prolapse 08/01/2013   Spider veins of limb 02/15/2013   Leukopenia 05/13/2012   Bilateral Dorsal Foot pain 04/23/2011   Gait abnormality 04/23/2011   Leg length inequality 04/23/2011   Nonspecific (abnormal) findings on radiological and other examination of body structure 07/24/2008   ABNORMAL CHEST XRAY 07/24/2008    PCP: Dwight Trula SQUIBB, MD REFERRING PROVIDER: Dwight Trula SQUIBB, MD  REFERRING DIAG: R42 (ICD-10-CM) - Dizziness and giddiness  THERAPY DIAG:  Dizziness and giddiness  ONSET DATE: 06/16/2024  Rationale for Evaluation and Treatment: Rehabilitation  SUBJECTIVE:   SUBJECTIVE STATEMENT: Was on Flonase for 2 weeks and her nose started bleeding and was no longer feeling dizzy. Was going to have a big oral surgery, but instead just tried a root canal. Was put on anti-biotics afterwards. Now back on Flonase. Still feels like she is on an airplane and it still needs to pop. Before when she was driving and felt like she had a rush of dizziness and that is gone. Was doing exercises religiously now that dizziness is better. Did feel like that were helping. Not having any life dizziness anymore.   Pt accompanied by: self  PERTINENT HISTORY: PMH: Bilateral eustachian tube dysfunction, persistent ear clogging, anxiety, depression, osteoporosis, peripheral neuropathy  PAIN:  Are you having pain? On-going dental pain and a torn  tendon of R ankle   There were no vitals filed for this visit.   PRECAUTIONS: None   FALLS: Has patient fallen in last 6 months? No  LIVING ENVIRONMENT: Lives with: lives alone Lives in: [place] Stairs: Yes, has stairs inside and outside, can hold onto the railing  Has following equipment at home: None  PLOF: Independent Still driving in town distances   PATIENT GOALS: Wants to figure out why she is dizzy, doesn't want to be dizzy anymore   OBJECTIVE:  Note: Objective measures were completed at Evaluation unless otherwise noted.   COGNITION: Overall cognitive status: Within functional limits for tasks assessed    POSTURE:  No Significant postural limitations  Cervical ROM:   WFL   GAIT: Gait pattern: WFL Distance walked: Clinic distances  Assistive device utilized: None Level of assistance: Complete Independence   VESTIBULAR ASSESSMENT:  GENERAL OBSERVATION: Ambulates in with no AD independently.   SYMPTOM BEHAVIOR:  Subjective history:  See above  Non-Vestibular symptoms: headaches  Type of dizziness: spatial episodes + feeling off  Frequency: Daily, can happen for 3-4 hours and go away   Duration: Can happen for a few hours   Aggravating factors: Spontaneous and sometimes will be driving and it happens or being on her computer   Relieving factors: time   Progression of symptoms: better  OCULOMOTOR EXAM:  Ocular Alignment: normal  Ocular ROM: No Limitations  Spontaneous Nystagmus: absent  Gaze-Induced Nystagmus: absent  Smooth Pursuits: intact and felt like she was having double vision all the way to the R   Saccades: intact  VESTIBULAR - OCULAR REFLEX:   Slow VOR: Normal  VOR Cancellation: Normal, when stopping feels a little like she is still moving   Head-Impulse Test: HIT Right: negative HIT Left: positive after 2nd rep  Not dizzy during,  but did feel off afterwards   Dynamic Visual Acuity: Static: Line 9 Dynamic: Line 3 6 line  difference, moderate dizziness during, needs steadying assist from PT afterwards    POSITIONAL TESTING: Right Dix-Hallpike: no nystagmus and feels spatially worse when coming back up  Left Dix-Hallpike: no nystagmus and feels more spatially weird when coming back up, more on this side  Right Roll Test: no nystagmus Left Roll Test: no nystagmus Right Sidelying: no nystagmus and felt something was spatially off with her eyes  Left Sidelying: no nystagmus and feels a little bit off balance, feels it behind her eyes right now, when coming back up, feels something is spatially off with her eyes      M-CTSIB  Condition 1: Firm Surface, EO 30 Sec, Normal Sway  Condition 2: Firm Surface, EC 30 Sec, Normal Sway  Condition 3: Foam Surface, EO 30 Sec, Normal Sway  Condition 4: Foam Surface, EC 30 Sec, Mild Sway      Gait with head turns over 20' - pt reporting some fuzziness/blurriness when going through midline, but no unsteadiness   Gait wit head nods over 20' - pt with no issues or imbalance                                                                                                                             TREATMENT DATE: 08/10/24  Therapeutic Activity: DHI: 4/100 = no handicap in regards to dizziness   Dynamic Visual Acuity: Static: Line 9 Dynamic: Line 3 6 line difference, moderate dizziness that subsided quickly afterwards Pt also reporting that she is getting evaluated soon to either get new glasses or getting cataract surgery   Reviewed HEP (see section below) and educated to continue to work on for vestibular function despite no longer having dizziness   PATIENT EDUCATION: Education details: Educated on goal results, continuing HEP, discussed pt's dizziness has gotten better by taking Flonase, but still having some vestibular dysfunction with VOR,continue to work on exercises at home and can return to PT in the future if needed if dizziness returns  Person  educated:  Patient Education method: Programmer, Multimedia, Demonstration, Verbal cues, and Handouts Education comprehension: verbalized understanding and returned demonstration  HOME EXERCISE PROGRAM: Standing VOR x 1 in horizontal direction with busy background    Access Code: [email or web address] URL: [email or web address] Date: 06/27/2024 Prepared by: Sheffield Senate  Program Notes On a foam pad/cushion in the corner: holding ball and making clockwise/counterclockwise circles, have space between feet, perform 2 sets of 10 reps   Exercises - Narrow Stance with Eyes Closed and Head Rotation on Foam Pad  - 1-2 x daily - 5 x weekly - 2 sets - 10 reps and head nods   GOALS: Goals reviewed with patient? Yes  SHORT TERM GOALS: ALL STGS = LTGS   LONG TERM GOALS: Target date: 07/22/2024  Pt will decr DHI to a 20 or less in order to demo improved sx with dizziness  Baseline: 30/100  4/100 (12/17) Goal status: MET  2.  Pt will perform DVA in 3 line difference or less with mild dizziness in order to demo improved VOR.  Baseline:6 line difference, moderate dizziness    6 line difference with mod dizziness on 12/17 Goal status: NOT MET    ASSESSMENT:  CLINICAL IMPRESSION: Pt returns to PT after >1 month after undergoing some dental procedures. Since pt was last here she has started taking Flonase and reports that she no longer has the dizziness that she has in everyday life. Assessed pt's LTGs today with pt improving DHI from a 30/100 > 4/100 indicating no handicap in regards to dizziness affecting pt's function. Pt does continue to have a 6 line difference on the DVA with moderate dizziness, with wondering if vision may also be playing a part as pt getting evaluated soon for potential cataract surgery or new glasses. Pt in agreement to D/C at this time due to improvement in dizziness and for pt to continue with HEP at home, esp with VOR exercises. Discussed if dizziness does return, pt will need to get  a new referral to return (or already has a new one from Dr. Karis that is good for 6 months.   OBJECTIVE IMPAIRMENTS: decreased activity tolerance and dizziness.   ACTIVITY LIMITATIONS: bed mobility and locomotion level  PARTICIPATION LIMITATIONS: driving and community activity  PERSONAL FACTORS: Past/current experiences, Time since onset of injury/illness/exacerbation, and 3+ comorbidities: : Bilateral eustachian tube dysfunction, persistent ear clogging, anxiety, depression, osteoporosis, peripheral neuropathy are also affecting patient's functional outcome.   REHAB POTENTIAL: Good  CLINICAL DECISION MAKING: Evolving/moderate complexity  EVALUATION COMPLEXITY: Moderate   PLAN:  PT FREQUENCY: 1x/week  PT DURATION: 4 weeks  PLANNED INTERVENTIONS: 97164- PT Re-evaluation, 97110-Therapeutic exercises, 97530- Therapeutic activity, [id number]- Neuromuscular re-education, 97535- Self Care, [id number]- Manual therapy, [phone]- Gait training, Patient/Family education, Balance training, and Vestibular training  PLAN FOR NEXT SESSION: D/C   Sheffield LOISE Senate, PT, DPT 08/10/2024, 3:08 PM

## 2024-08-20 LAB — NMR, LIPOPROFILE
Cholesterol, Total: 181 mg/dL (ref 100–199)
HDL Particle Number: 34.9 umol/L
HDL-C: 76 mg/dL
LDL Particle Number: 812 nmol/L
LDL Size: 21.3 nm
LDL-C (NIH Calc): 96 mg/dL (ref 0–99)
LP-IR Score: 25
Small LDL Particle Number: <90 nmol/L
Triglycerides: 43 mg/dL (ref 0–149)

## 2024-08-22 ENCOUNTER — Ambulatory Visit (HOSPITAL_BASED_OUTPATIENT_CLINIC_OR_DEPARTMENT_OTHER): Payer: Self-pay | Admitting: Family

## 2024-08-23 ENCOUNTER — Other Ambulatory Visit (HOSPITAL_BASED_OUTPATIENT_CLINIC_OR_DEPARTMENT_OTHER): Payer: Self-pay | Admitting: *Deleted

## 2024-08-23 DIAGNOSIS — E782 Mixed hyperlipidemia: Secondary | ICD-10-CM

## 2024-08-23 DIAGNOSIS — I700 Atherosclerosis of aorta: Secondary | ICD-10-CM

## 2024-08-29 ENCOUNTER — Encounter: Payer: Self-pay | Admitting: Internal Medicine

## 2024-08-29 ENCOUNTER — Ambulatory Visit: Admitting: Podiatry

## 2024-08-29 DIAGNOSIS — R140 Abdominal distension (gaseous): Secondary | ICD-10-CM

## 2024-09-02 NOTE — Telephone Encounter (Signed)
 Okay to repeat SIBO breath testing and a [facility] infectious stool panel without C. Difficile Thanks [employer]

## 2024-09-05 ENCOUNTER — Ambulatory Visit (INDEPENDENT_AMBULATORY_CARE_PROVIDER_SITE_OTHER): Admitting: Otolaryngology

## 2024-09-07 ENCOUNTER — Telehealth: Payer: Self-pay

## 2024-09-07 NOTE — Telephone Encounter (Signed)
 Spoke with patient and relayed that Dr. Albertus did not think she had norovirus but that her symptoms were related more to her functional irritable bowel type situation for which she has been evaluated.  Reassured patient that it was unlikely she was contagious because Dr. Albertus does not think she has an actual infection.  Patient verbalized understanding.

## 2024-09-07 NOTE — Telephone Encounter (Signed)
 Received fax from [facility] lab with patient's GI panel results. Results showed that patient's stool was positive for Norovirus.   Results given to Dr. Albertus to review. Dr. Albertus states he feels this could be a false positive. Patient is currently constipated. Dr. Albertus states to reach out to patient and check with her, but either way no need for treatment for viral infection.   Left message for patient to return my call.

## 2024-09-08 ENCOUNTER — Encounter (HOSPITAL_BASED_OUTPATIENT_CLINIC_OR_DEPARTMENT_OTHER): Payer: Self-pay

## 2024-09-09 ENCOUNTER — Encounter: Payer: Self-pay | Admitting: Internal Medicine

## 2024-09-09 NOTE — Progress Notes (Signed)
 " Cardiology Office Note:  .   Date:  09/12/2024  ID:  Megan [name], DOB [birth year], MRN [id number] PCP: Dwight Trula SQUIBB, MD  [facility] Providers Cardiologist:  Redell Shallow, MD   History of Present Illness: .    Chief Complaint  Patient presents with   Shortness of Breath         Megan [name] is a 69 y.o. female with below history who presents for follow-up.   History of Present Illness   Megan [name] is a 69 year old female with a history of membranous VSD, PFO, and mitral valve prolapse who presents with increased shortness of breath and irregular heartbeats. She was referred by her primary care physician for evaluation of potential cardiac issues.  For the past month, she has experienced increased shortness of breath, particularly when walking up stairs or inclines, along with a higher than normal heart rate. She also experiences headaches and general discomfort. Initial workup, including CBC, CMP, chest X-ray, troponin, and EKG, were normal.  She describes experiencing irregular heartbeats, characterized as 'a lot of skipped beats,' which were noted during blood pressure checks. She tracks her heart rate variability, which has shown significant changes since December, with values dropping to zero in January.  Her hormone levels have fluctuated unexpectedly, with estrogen levels rising from the thirties to almost sixty, despite no changes in her hormone replacement therapy regimen, which includes estradiol , testosterone , and progesterone . She has been on this therapy for five years.  She experiences occasional chest discomfort, which has not worsened, and some discomfort in her back, which she is unsure if it is heart or lung related. No significant chest pain is reported.  She drinks approximately forty ounces of water daily, which is less than the recommended amount. Her diet is rich in vegetables and includes a smoothie with liquid daily.           Problem List Membranous VSD -s/p spontanous closure  2. PFO 3. MVP 4. HLD -CAC = 0  5. Hypotension     ROS: All other ROS reviewed and negative. Pertinent positives noted in the HPI.     Studies Reviewed: [name]   EKG Interpretation Date/Time:  Monday September 12 2024 11:40:18 EST Ventricular Rate:  65 PR Interval:  182 QRS Duration:  78 QT Interval:  416 QTC Calculation: 432 R Axis:   81  Text Interpretation: Sinus rhythm with Premature atrial complexes Confirmed by Barbaraann Kotyk [phone]) on 09/12/2024 11:43:33 AM   TTE 06/14/2024  1. Membranous ventricular septal aneursym (seen best in imaged 76),  without evidence for shunting. Left ventricular ejection fraction, by  estimation, is 70 to 75%. Left ventricular ejection fraction by 3D volume  is 75 %. The left ventricle has  hyperdynamic function. The left ventricle has no regional wall motion  abnormalities. Left ventricular diastolic parameters were normal.   2. Right ventricular systolic function is normal. The right ventricular  size is mildly enlarged. There is normal pulmonary artery systolic  pressure. The estimated right ventricular systolic pressure is 32.6 mmHg.   3. Left atrial size was moderately dilated.   4. Type 1R (predominantly right bowing) atrial septal aneursym. Right  atrial size was moderately dilated.   5. Borderline late systolic bileaflet prolapse. The mitral valve is  abnormal. Trivial mitral valve regurgitation.   6. The aortic valve is tricuspid. Aortic valve regurgitation is not  visualized.   7. The inferior vena cava is normal in size  with <50% respiratory  variability, suggesting right atrial pressure of 8 mmHg.   8. Cannot exclude interatrial shunt. Consider bubble study or TEE/bubble  study to evalute for ASD/PFO.   Physical Exam:   VS:  BP 110/66   Pulse 68   Ht 5' 9 (1.753 m)   Wt 134 lb 8 oz (61 kg)   SpO2 99%   BMI 19.86 kg/m    Wt Readings from Last 3 Encounters:  09/12/24 134  lb 8 oz (61 kg)  08/04/24 134 lb (60.8 kg)  08/04/24 134 lb 6.4 oz (61 kg)    GEN: Well nourished, well developed in no acute distress NECK: No JVD; No carotid bruits CARDIAC: irregular rhythm, no murmurs, rubs, gallops RESPIRATORY:  Clear to auscultation without rales, wheezing or rhonchi  ABDOMEN: Soft, non-tender, non-distended EXTREMITIES:  No edema; No deformity  ASSESSMENT AND PLAN: .   Assessment and Plan    Premature atrial contractions SOB Irregular heart rhythm with PACs on EKG. Symptoms include palpitations and exertional dyspnea. Hormonal imbalance may contribute. Normal heart function on echocardiogram. No life-threatening condition identified. - Encouraged increased water intake to at least 60 ounces per day. - Advised to follow up to address hormonal imbalance. - Will repeat heart monitor for two weeks to assess for arrhythmias. - No need for metoprolol  at this time. She is not wanting to take any medications unless absolutely necessary.  - 3 mo follow up with [facility].                Follow-up: Return in about 6 months (around 03/12/2025).  Signed, Darryle DASEN. Barbaraann, MD, [facility]  [facility]  [address] [place], [place] [address] [phone]  12:24 PM   "

## 2024-09-12 ENCOUNTER — Encounter (HOSPITAL_BASED_OUTPATIENT_CLINIC_OR_DEPARTMENT_OTHER): Payer: Self-pay | Admitting: Cardiovascular Disease

## 2024-09-12 ENCOUNTER — Ambulatory Visit (INDEPENDENT_AMBULATORY_CARE_PROVIDER_SITE_OTHER): Admitting: Cardiovascular Disease

## 2024-09-12 ENCOUNTER — Ambulatory Visit: Attending: Cardiovascular Disease

## 2024-09-12 VITALS — BP 110/66 | HR 68 | Ht 69.0 in | Wt 134.5 lb

## 2024-09-12 DIAGNOSIS — R002 Palpitations: Secondary | ICD-10-CM

## 2024-09-12 DIAGNOSIS — R0602 Shortness of breath: Secondary | ICD-10-CM

## 2024-09-12 DIAGNOSIS — Q210 Ventricular septal defect: Secondary | ICD-10-CM

## 2024-09-12 DIAGNOSIS — E782 Mixed hyperlipidemia: Secondary | ICD-10-CM | POA: Diagnosis not present

## 2024-09-12 DIAGNOSIS — I491 Atrial premature depolarization: Secondary | ICD-10-CM | POA: Diagnosis not present

## 2024-09-12 DIAGNOSIS — Q2112 Patent foramen ovale: Secondary | ICD-10-CM

## 2024-09-12 NOTE — Patient Instructions (Signed)
 Medication Instructions:  No changes *If you need a refill on your cardiac medications before your next appointment, please call your pharmacy*  Lab Work: none  Testing/Procedures: Zio XT heart monitor - 14 days - see below  Follow-Up: At [facility], you and your health needs are our priority.  As part of our continuing mission to provide you with exceptional heart care, our providers are all part of one team.  This team includes your primary Cardiologist (physician) and Advanced Practice Providers or APPs (Physician Assistants and Nurse Practitioners) who all work together to provide you with the care you need, when you need it.  Your next appointment:   6 month(s)  Provider:   Redell Shallow, MD    GEOFFRY HEWS- Long Term Monitor Instructions  Your physician has requested you wear a ZIO patch monitor for 14 days.  This is a single patch monitor. [employer] supplies one patch monitor per enrollment. Additional stickers are not available. Please do not apply patch if you will be having a Nuclear Stress Test,  Echocardiogram, Cardiac CT, MRI, or Chest Xray during the period you would be wearing the  monitor. The patch cannot be worn during these tests. You cannot remove and re-apply the  ZIO XT patch monitor.  Your ZIO patch monitor will be mailed 3 day USPS to your address on file. It may take 3-5 days  to receive your monitor after you have been enrolled.  Once you have received your monitor, please review the enclosed instructions. Your monitor  has already been registered assigning a specific monitor serial # to you.  Billing and Patient Assistance Program Information  We have supplied [employer] with any of your insurance information on file for billing purposes. [employer] offers a sliding scale Patient Assistance Program for patients that do not have  insurance, or whose insurance does not completely cover the cost of the ZIO monitor.  You must apply for the Patient Assistance  Program to qualify for this discounted rate.  To apply, please call [employer] at [phone], select option 4, select option 2, ask to apply for  Patient Assistance Program. Meredeth will ask your household income, and how many people  are in your household. They will quote your out-of-pocket cost based on that information.  [employer] will also be able to set up a 91-month, interest-free payment plan if needed.  Applying the monitor Hold abrader disc by orange tab. Rub abrader in 40 strokes over the upper left chest as  indicated in your monitor instructions.  Clean area with 4 enclosed alcohol pads. Let dry.  Apply patch as indicated in monitor instructions. Patch will be placed under collarbone on left  side of chest with arrow pointing upward.  Rub patch adhesive wings for 2 minutes. Remove white label marked 1. Remove the white  label marked 2. Rub patch adhesive wings for 2 additional minutes.  While looking in a mirror, press and release button in center of patch. A small green light will  flash 3-4 times. This will be your only indicator that the monitor has been turned on.  Do not shower for the first 24 hours. You may shower after the first 24 hours.  Press the button if you feel a symptom. You will hear a small click. Record Date, Time and  Symptom in the Patient Logbook.  When you are ready to remove the patch, follow instructions on the last 2 pages of Patient  Logbook. Stick patch monitor onto the last page  of Patient Logbook.  Place Patient Logbook in the blue and white box. Use locking tab on box and tape box closed  securely. The blue and white box has prepaid postage on it. Please place it in the mailbox as  soon as possible. Your physician should have your test results approximately 7 days after the  monitor has been mailed back to [facility].  Call [facility] Customer Care at [phone] if you have questions regarding  your ZIO XT patch monitor. Call them  immediately if you see an orange light blinking on your  monitor.  If your monitor falls off in less than 4 days, contact our Monitor department at [phone].  If your monitor becomes loose or falls off after 4 days call [employer] at [phone] for  suggestions on securing your monitor

## 2024-09-12 NOTE — Progress Notes (Unsigned)
 Enrolled patient for a 14 day Zio XT  monitor to be mailed to patients home

## 2024-09-14 ENCOUNTER — Encounter: Payer: Self-pay | Admitting: Internal Medicine

## 2024-09-21 ENCOUNTER — Encounter: Payer: Self-pay | Admitting: Internal Medicine

## 2024-09-21 ENCOUNTER — Ambulatory Visit: Payer: Self-pay | Admitting: Internal Medicine

## 2024-09-21 VITALS — BP 90/60 | HR 60 | Ht 69.0 in | Wt 134.4 lb

## 2024-09-21 DIAGNOSIS — D510 Vitamin B12 deficiency anemia due to intrinsic factor deficiency: Secondary | ICD-10-CM

## 2024-09-21 DIAGNOSIS — K638211 Small intestinal bacterial overgrowth, hydrogen-subtype: Secondary | ICD-10-CM | POA: Diagnosis not present

## 2024-09-21 DIAGNOSIS — Z800 Family history of malignant neoplasm of digestive organs: Secondary | ICD-10-CM

## 2024-09-21 DIAGNOSIS — K638219 Small intestinal bacterial overgrowth, unspecified: Secondary | ICD-10-CM

## 2024-09-21 DIAGNOSIS — R140 Abdominal distension (gaseous): Secondary | ICD-10-CM

## 2024-09-21 DIAGNOSIS — R194 Change in bowel habit: Secondary | ICD-10-CM

## 2024-09-21 DIAGNOSIS — K5909 Other constipation: Secondary | ICD-10-CM

## 2024-09-21 DIAGNOSIS — K5901 Slow transit constipation: Secondary | ICD-10-CM | POA: Diagnosis not present

## 2024-09-21 DIAGNOSIS — K5904 Chronic idiopathic constipation: Secondary | ICD-10-CM

## 2024-09-21 NOTE — Progress Notes (Signed)
 "  Subjective:    Patient ID: [name], female    DOB: 05-29-1956, 69 y.o.   MRN: [id number]  HPI Yulia Ulrich is a 69 year old female with chronic idiopathic constipation, SIBO, pelvic floor dysfunction, and pernicious anemia who presents for follow-up of persistent constipation and associated gastrointestinal symptoms.   Overall she is feeling better than she did in December however still issues with daily bloating.  Manual vaginal pressure is often required to assist with evacuation. Bowel movements are tracked daily, with variable stool output ranging from several inches to over a foot, and occasional passage of rocky or small stools. Despite regular bowel movements, evacuation rarely feels complete. Diarrhea is not typical, even during illness. Morning coffee (2-3 cups over 2-3 hours) is relied upon to stimulate bowel movements; missing this window results in no bowel movement until the next day. For severe constipation, she occasionally uses Dulcolax, but avoids Miralax due to intolerance.   In November, constipation worsened after starting a probiotic (Seed) for one week, resulting in unusually large stool volume requiring plumbing intervention. Discontinuation of the probiotic was followed by worsening symptoms in December, including headache, facial swelling, exacerbation of rosacea, abdominal pain, and significant nausea. Severe nausea persisted for two weeks, with two nights spent lying on the bathroom floor and a third night with a trash can by the bed, but without vomiting. She described a sensation of something feels very sick inside of me after bowel movements. Symptoms gradually improved after two weeks. During this period, diagnostic testing revealed a positive SIBO breath test and a positive norovirus test, though diarrhea did not develop. Chronic bloating with persistent abdominal distension is present. Diet is not restrictive and includes fermented foods such as  kimchi.   Motegrity  and rifaximin have not been used recently. She has not accessed the [facility], though she continues with her functional medicine doctor.   She discussed her recent SIBO, hydrogen predominant breath test positivity with her functional medicine team.  They recommended 3 options including do-nothing, do a combination rifaximin followed by low-dose Motegrity  and the third option was a naturopathic approach including berberine, oil of oregano and a garlic type supplement.  She fears that using all 3 of these would not work best for her.  Current medications include hormone replacement therapy (estradiol ). During the recent episode, estradiol  levels fluctuated from 25 (normal) to 40, then down to 15 two weeks later. She expresses concern about the relationship between hormone fluctuations and worsening constipation. Pernicious anemia is managed with parenteral or sublingual B12 supplementation. No new symptoms related to anemia are reported.   She is interested in an anti-vinculin testing.  Her understanding is this might help guide whether Motegrity  would help her.   Review of Systems As per HPI, otherwise negative  Current Medications, Allergies, Past Medical History, Past Surgical History, Family History and Social History were reviewed in [name] record.    Objective:   Physical Exam BP 90/60   Pulse 60   Ht 5' 9 (1.753 m)   Wt 134 lb 6.4 oz (61 kg)   BMI 19.85 kg/m  Gen: awake, alert, NAD Neuro: nonfocal  DIAGNOSTIC Upper endoscopy: Normal (October 2024) Colonoscopy: Normal (October 2024)   DIAGNOSTIC EGD: 2 cm hiatal hernia, Clendenning grade IV gastroesophageal flap, normal mucosa, normal duodenum, negative tongue brushing for yeast (06/02/2023) Colonoscopy: Normal colonoscopy, moderately tortuous left colon (06/02/2023)   PATHOLOGY Gastric biopsies: Normal gastric cardiac, fundus, body, and  antral biopsies; no H. pylori,  intestinal metaplasia, dysplasia, or malignancy (06/02/2023)   CT ABDOMEN AND PELVIS WITHOUT CONTRAST   TECHNIQUE: Multidetector CT imaging of the abdomen and pelvis was performed following the standard protocol without IV contrast.   RADIATION DOSE REDUCTION: This exam was performed according to the departmental dose-optimization program which includes automated exposure control, adjustment of the mA and/or kV according to patient size and/or use of iterative reconstruction technique.   COMPARISON:  Pelvic ultrasound 08/28/2023. Abdominal MRI 12/28/2019.   FINDINGS: Lower chest: No acute abnormality.   Hepatobiliary: No focal liver abnormality is seen. No gallstones, gallbladder wall thickening, or biliary dilatation.   Pancreas: Stable 8 mm hypodensity at the body/tail junction (2:26), stable from 2021 and likely representing a side-branch IPMN. No pancreatic ductal dilatation or surrounding inflammatory changes.   Spleen: Normal in size without focal abnormality.   Adrenals/Urinary Tract: Adrenal glands are unremarkable. Kidneys are normal, without renal calculi, focal lesion, or hydronephrosis. Bladder is unremarkable.   Stomach/Bowel: Stomach is within normal limits. Appendix appears normal. Large colonic stool burden. No evidence of bowel wall thickening, distention, or inflammatory changes.   Vascular/Lymphatic: No significant vascular findings are present. No enlarged abdominal or pelvic lymph nodes.   Reproductive: Uterus and bilateral adnexa are unremarkable.   Other: No abdominal wall hernia or abnormality. No abdominopelvic ascites.   Musculoskeletal: No acute or significant osseous findings.   IMPRESSION: 1. No acute abnormality in the abdomen or pelvis. 2. Large colonic stool burden.  Correlate for constipation. 3. Normal appendix.     Electronically Signed   By: Gwynneth Akin M.D.   On: 09/18/2023 15:13       Assessment & Plan:   Chronic  idiopathic constipation/intestinal dysmotility with colonic inertia and with possible pelvic floor dysfunction and rectocele/addendum: Chronic constipation with slow colonic transit and incomplete evacuation, likely with component of dyssynergic defecation. - Ordered anorectal manometry and balloon expulsion test. - Provided Suprep bowel preparation instructions, half dose initially.  This would only be if purge is necessary based on symptoms - Deferred Motegrity  until post-diagnostic results. - Follow-up in late April for test results and symptom reassessment. - Continue current laxative regimen with magnesium  Small intestinal bacterial overgrowth (SIBO) Persistent bloating with hydrogen-predominant SIBO, risk of recurrence due to motility and anatomic factors.  We discussed specifically her risk for SIBO comes from her intestinal stasis, impaired motility, and the breakdown of protective mechanisms that normally maintain small bowel sterility - Recommended four-week berberine trial, optional oil of oregano and allicin/garlic. - Ordered repeat SIBO breath test post-herbal regimen. - Discussed rifaximin if repeat test positive and symptoms persist. - Deferred Motegrity  until post-herbal/SIBO treatment.  Family history of colon cancer -Up-to-date, 5-year screening interval recommended  Pernicious anemia Positive antiparietal and anti-intrinsic factor antibodies - Continue parenteral B12  Follow-up with me late April 2026  45 minutes total spent today including patient facing time, coordination of care, reviewing medical history/procedures/pertinent radiology studies, and documentation of the encounter.  "

## 2024-09-21 NOTE — Patient Instructions (Signed)
 Hold off on Motegrity  for now.   Continue treatment for SIBO (small intestine bacterial overgrowth). Two weeks after treatment, submit another breath test.   We will contact you when the schedule for April becomes available.   You have been scheduled to have an anorectal manometry at [facility] on 01/11/25 at 8:30am. Please arrive 30 minutes prior to your appointment time for registration (1st floor of the hospital-admissions).  Please make certain to use 1 Fleets enema 2 hours prior to coming for your appointment. You can purchase Fleets enemas from the laxative section at your drug store. You should not eat anything during the two hours prior to the procedure. You may take regular medications with small sips of water at least 2 hours prior to the study.  Anorectal manometry is a test performed to evaluate patients with constipation or fecal incontinence. This test measures the pressures of the anal sphincter muscles, the sensation in the rectum, and the neural reflexes that are needed for normal bowel movements.  THE PROCEDURE The test takes approximately 30 minutes to 1 hour. You will be asked to change into a hospital gown. A technician or nurse will explain the procedure to you, take a brief health history, and answer any questions you may have. The patient then lies on his or her left side. A small, flexible tube, about the size of a thermometer, with a balloon at the end is inserted into the rectum. The catheter is connected to a machine that measures the pressure. During the test, the small balloon attached to the catheter may be inflated in the rectum to assess the normal reflex pathways. The nurse or technician may also ask the person to squeeze, relax, and push at various times. The anal sphincter muscle pressures are measured during each of these maneuvers. To squeeze, the patient tightens the sphincter muscles as if trying to prevent anything from coming out. To push or bear down,  the patient strains down as if trying to have a bowel movement.   _______________________________________________________  If your blood pressure at your visit was 140/90 or greater, please contact your primary care physician to follow up on this.  _______________________________________________________  If you are age 67 or older, your body mass index should be between 23-30. Your Body mass index is 19.85 kg/m. If this is out of the aforementioned range listed, please consider follow up with your Primary Care Provider.  If you are age 33 or younger, your body mass index should be between 19-25. Your Body mass index is 19.85 kg/m. If this is out of the aformentioned range listed, please consider follow up with your Primary Care Provider.   ________________________________________________________  The [facility] providers would like to encourage you to use MYCHART to communicate with providers for non-urgent requests or questions.  Due to long hold times on the telephone, sending your provider a message by [facility] may be a faster and more efficient way to get a response.  Please allow 48 business hours for a response.  Please remember that this is for non-urgent requests.  _______________________________________________________  [facility] is using a team-based approach to care.  Your team is made up of your doctor and two to three APPS. Our APPS (Nurse Practitioners and Physician Assistants) work with your physician to ensure care continuity for you. They are fully qualified to address your health concerns and develop a treatment plan. They communicate directly with your gastroenterologist to care for you. Seeing the Advanced Practice Practitioners on  your physician's team can help you by facilitating care more promptly, often allowing for earlier appointments, access to diagnostic testing, procedures, and other specialty referrals.

## 2024-09-23 ENCOUNTER — Encounter: Payer: Self-pay | Admitting: Internal Medicine

## 2024-09-26 ENCOUNTER — Encounter (HOSPITAL_BASED_OUTPATIENT_CLINIC_OR_DEPARTMENT_OTHER): Payer: Self-pay | Admitting: Cardiovascular Disease

## 2024-10-06 ENCOUNTER — Ambulatory Visit: Admitting: Podiatry

## 2024-10-14 ENCOUNTER — Ambulatory Visit: Admitting: Podiatry

## 2024-10-26 ENCOUNTER — Inpatient Hospital Stay: Admitting: Hematology

## 2024-10-26 ENCOUNTER — Inpatient Hospital Stay

## 2024-10-28 ENCOUNTER — Ambulatory Visit (HOSPITAL_BASED_OUTPATIENT_CLINIC_OR_DEPARTMENT_OTHER): Admitting: Family

## 2024-11-07 ENCOUNTER — Ambulatory Visit (INDEPENDENT_AMBULATORY_CARE_PROVIDER_SITE_OTHER): Admitting: Otolaryngology

## 2024-11-14 ENCOUNTER — Ambulatory Visit: Admitting: Internal Medicine

## 2024-12-06 ENCOUNTER — Ambulatory Visit: Admitting: Internal Medicine

## 2025-01-11 ENCOUNTER — Ambulatory Visit (HOSPITAL_COMMUNITY): Admit: 2025-01-11 | Admitting: Internal Medicine

## 2025-01-11 ENCOUNTER — Encounter (HOSPITAL_COMMUNITY): Payer: Self-pay

## 2025-01-20 ENCOUNTER — Ambulatory Visit (HOSPITAL_BASED_OUTPATIENT_CLINIC_OR_DEPARTMENT_OTHER): Admitting: Family

## 2025-04-24 ENCOUNTER — Other Ambulatory Visit

## 2025-04-24 ENCOUNTER — Ambulatory Visit: Admitting: Hematology
